# Patient Record
Sex: Male | Born: 1990 | Race: White | Hispanic: No | Marital: Single | Smoking: Current every day smoker
Health system: Southern US, Community
[De-identification: ages and names within clinical notes are randomized; demographics above are authoritative.]

## PROBLEM LIST (undated history)

## (undated) DIAGNOSIS — J45909 Unspecified asthma, uncomplicated: Secondary | ICD-10-CM

## (undated) DIAGNOSIS — F32A Depression, unspecified: Secondary | ICD-10-CM

## (undated) DIAGNOSIS — F1510 Other stimulant abuse, uncomplicated: Secondary | ICD-10-CM

## (undated) DIAGNOSIS — F419 Anxiety disorder, unspecified: Secondary | ICD-10-CM

## (undated) DIAGNOSIS — F319 Bipolar disorder, unspecified: Secondary | ICD-10-CM

## (undated) DIAGNOSIS — F4310 Post-traumatic stress disorder, unspecified: Secondary | ICD-10-CM

## (undated) DIAGNOSIS — F329 Major depressive disorder, single episode, unspecified: Secondary | ICD-10-CM

## (undated) HISTORY — DX: Post-traumatic stress disorder, unspecified: F43.10

## (undated) HISTORY — DX: Anxiety disorder, unspecified: F41.9

## (undated) HISTORY — DX: Major depressive disorder, single episode, unspecified: F32.9

## (undated) HISTORY — DX: Depression, unspecified: F32.A

---
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
## 2011-01-08 ENCOUNTER — Emergency Department: Payer: Self-pay | Admitting: Emergency Medicine

## 2011-01-18 ENCOUNTER — Emergency Department: Payer: Self-pay | Admitting: Unknown Physician Specialty

## 2011-11-07 ENCOUNTER — Emergency Department: Payer: Self-pay | Admitting: Emergency Medicine

## 2011-12-01 ENCOUNTER — Emergency Department: Payer: Self-pay | Admitting: *Deleted

## 2011-12-23 ENCOUNTER — Emergency Department: Payer: Self-pay | Admitting: Emergency Medicine

## 2012-02-05 ENCOUNTER — Emergency Department: Payer: Self-pay | Admitting: Emergency Medicine

## 2012-03-01 ENCOUNTER — Emergency Department: Payer: Self-pay | Admitting: Emergency Medicine

## 2012-03-01 LAB — CBC
HCT: 47.6 % (ref 40.0–52.0)
HGB: 16 g/dL (ref 13.0–18.0)
MCV: 87 fL (ref 80–100)
RBC: 5.46 10*6/uL (ref 4.40–5.90)
RDW: 13.6 % (ref 11.5–14.5)
WBC: 11.4 10*3/uL — ABNORMAL HIGH (ref 3.8–10.6)

## 2012-03-01 LAB — COMPREHENSIVE METABOLIC PANEL
BUN: 6 mg/dL — ABNORMAL LOW (ref 7–18)
Calcium, Total: 9.1 mg/dL (ref 8.5–10.1)
Chloride: 106 mmol/L (ref 98–107)
Co2: 28 mmol/L (ref 21–32)
EGFR (African American): >60
EGFR (Non-African Amer.): >60
Osmolality: 280 (ref 275–301)
Potassium: 3.3 mmol/L — ABNORMAL LOW (ref 3.5–5.1)
SGOT(AST): 32 U/L (ref 15–37)
SGPT (ALT): 27 U/L (ref 12–78)
Total Protein: 8.4 g/dL — ABNORMAL HIGH (ref 6.4–8.2)

## 2012-03-26 ENCOUNTER — Emergency Department: Payer: Self-pay | Admitting: Emergency Medicine

## 2012-03-29 ENCOUNTER — Emergency Department: Payer: Self-pay | Admitting: Emergency Medicine

## 2012-04-03 ENCOUNTER — Emergency Department: Payer: Self-pay | Admitting: Emergency Medicine

## 2012-04-08 ENCOUNTER — Emergency Department: Payer: Self-pay | Admitting: Emergency Medicine

## 2012-04-23 ENCOUNTER — Emergency Department: Payer: Self-pay | Admitting: Emergency Medicine

## 2012-04-28 ENCOUNTER — Emergency Department: Payer: Self-pay | Admitting: Emergency Medicine

## 2012-04-28 LAB — COMPREHENSIVE METABOLIC PANEL
Alkaline Phosphatase: 75 U/L (ref 50–136)
Anion Gap: 6 — ABNORMAL LOW (ref 7–16)
BUN: 7 mg/dL (ref 7–18)
Bilirubin,Total: 1.1 mg/dL — ABNORMAL HIGH (ref 0.2–1.0)
Calcium, Total: 9.1 mg/dL (ref 8.5–10.1)
Creatinine: 1 mg/dL (ref 0.60–1.30)
EGFR (African American): >60
EGFR (Non-African Amer.): >60
SGOT(AST): 13 U/L — ABNORMAL LOW (ref 15–37)
Sodium: 137 mmol/L (ref 136–145)
Total Protein: 8.3 g/dL — ABNORMAL HIGH (ref 6.4–8.2)

## 2012-04-28 LAB — DRUG SCREEN, URINE
Barbiturates, Ur Screen: NEGATIVE (ref ?–200)
Cannabinoid 50 Ng, Ur ~~LOC~~: NEGATIVE (ref ?–50)
Cocaine Metabolite,Ur ~~LOC~~: NEGATIVE (ref ?–300)
MDMA (Ecstasy)Ur Screen: NEGATIVE (ref ?–500)
Opiate, Ur Screen: NEGATIVE (ref ?–300)
Phencyclidine (PCP) Ur S: NEGATIVE (ref ?–25)
Tricyclic, Ur Screen: NEGATIVE (ref ?–1000)

## 2012-04-28 LAB — CBC
HCT: 45.3 % (ref 40.0–52.0)
MCHC: 33.4 g/dL (ref 32.0–36.0)
MCV: 86 fL (ref 80–100)
RDW: 13.5 % (ref 11.5–14.5)

## 2012-04-29 ENCOUNTER — Inpatient Hospital Stay: Payer: Self-pay | Admitting: Student

## 2012-04-29 LAB — CBC WITH DIFFERENTIAL/PLATELET
Basophil #: 0 10*3/uL (ref 0.0–0.1)
Basophil %: 0.2 %
Eosinophil #: 0 10*3/uL (ref 0.0–0.7)
HCT: 43.5 % (ref 40.0–52.0)
HGB: 15.2 g/dL (ref 13.0–18.0)
Lymphocyte #: 0.7 10*3/uL — ABNORMAL LOW (ref 1.0–3.6)
MCHC: 34.9 g/dL (ref 32.0–36.0)
MCV: 87 fL (ref 80–100)
Monocyte #: 0.7 x10 3/mm (ref 0.2–1.0)
Monocyte %: 8.2 %
Neutrophil %: 83.9 %
Platelet: 197 10*3/uL (ref 150–440)

## 2012-04-29 LAB — BASIC METABOLIC PANEL
BUN: 9 mg/dL (ref 7–18)
Co2: 21 mmol/L (ref 21–32)
Creatinine: 1.09 mg/dL (ref 0.60–1.30)
Glucose: 95 mg/dL (ref 65–99)
Osmolality: 280 (ref 275–301)

## 2012-04-29 LAB — RAPID INFLUENZA A&B ANTIGENS

## 2012-04-30 LAB — CBC WITH DIFFERENTIAL/PLATELET
Eosinophil %: 0 %
Lymphocyte %: 7.1 %
MCH: 29.3 pg (ref 26.0–34.0)
MCHC: 33.7 g/dL (ref 32.0–36.0)
MCV: 87 fL (ref 80–100)
Monocyte #: 0.5 x10 3/mm (ref 0.2–1.0)
Neutrophil #: 11.1 10*3/uL — ABNORMAL HIGH (ref 1.4–6.5)
Neutrophil %: 89.1 %
RDW: 14 % (ref 11.5–14.5)

## 2012-04-30 LAB — BASIC METABOLIC PANEL
BUN: 9 mg/dL (ref 7–18)
Calcium, Total: 8.3 mg/dL — ABNORMAL LOW (ref 8.5–10.1)
Co2: 23 mmol/L (ref 21–32)
Creatinine: 0.86 mg/dL (ref 0.60–1.30)
EGFR (Non-African Amer.): >60
Glucose: 144 mg/dL — ABNORMAL HIGH (ref 65–99)
Osmolality: 277 (ref 275–301)

## 2012-05-04 LAB — CULTURE, BLOOD (SINGLE)

## 2012-05-29 ENCOUNTER — Emergency Department: Payer: Self-pay | Admitting: Emergency Medicine

## 2012-06-08 ENCOUNTER — Emergency Department: Payer: Self-pay | Admitting: Internal Medicine

## 2012-06-09 ENCOUNTER — Emergency Department: Payer: Self-pay | Admitting: Emergency Medicine

## 2012-06-17 ENCOUNTER — Emergency Department: Payer: Self-pay | Admitting: Emergency Medicine

## 2012-06-17 LAB — DRUG SCREEN, URINE
Amphetamines, Ur Screen: NEGATIVE (ref ?–1000)
Barbiturates, Ur Screen: NEGATIVE (ref ?–200)
Cannabinoid 50 Ng, Ur ~~LOC~~: NEGATIVE (ref ?–50)
Methadone, Ur Screen: NEGATIVE (ref ?–300)
Phencyclidine (PCP) Ur S: NEGATIVE (ref ?–25)
Tricyclic, Ur Screen: NEGATIVE (ref ?–1000)

## 2012-06-17 LAB — COMPREHENSIVE METABOLIC PANEL
Alkaline Phosphatase: 69 U/L (ref 50–136)
BUN: 7 mg/dL (ref 7–18)
Calcium, Total: 8.6 mg/dL (ref 8.5–10.1)
Chloride: 109 mmol/L — ABNORMAL HIGH (ref 98–107)
Creatinine: 0.81 mg/dL (ref 0.60–1.30)
EGFR (Non-African Amer.): >60
Glucose: 82 mg/dL (ref 65–99)
Osmolality: 280 (ref 275–301)
Potassium: 3.3 mmol/L — ABNORMAL LOW (ref 3.5–5.1)
SGOT(AST): 18 U/L (ref 15–37)
Sodium: 142 mmol/L (ref 136–145)
Total Protein: 7.6 g/dL (ref 6.4–8.2)

## 2012-06-17 LAB — URINALYSIS, COMPLETE
Glucose,UR: NEGATIVE mg/dL (ref 0–75)
Leukocyte Esterase: NEGATIVE
Ph: 6 (ref 4.5–8.0)
Protein: NEGATIVE
Squamous Epithelial: <1

## 2012-06-17 LAB — CBC
HGB: 14.2 g/dL (ref 13.0–18.0)
MCH: 28.2 pg (ref 26.0–34.0)
RBC: 5.04 10*6/uL (ref 4.40–5.90)
RDW: 14 % (ref 11.5–14.5)
WBC: 8.7 10*3/uL (ref 3.8–10.6)

## 2012-06-17 LAB — ETHANOL
Ethanol %: <0.003 % (ref 0.000–0.080)
Ethanol: <3 mg/dL

## 2012-07-01 ENCOUNTER — Emergency Department: Payer: Self-pay | Admitting: Emergency Medicine

## 2012-07-02 ENCOUNTER — Emergency Department: Payer: Self-pay | Admitting: Emergency Medicine

## 2012-09-06 ENCOUNTER — Emergency Department: Payer: Self-pay | Admitting: Emergency Medicine

## 2012-09-29 ENCOUNTER — Emergency Department: Payer: Self-pay | Admitting: Emergency Medicine

## 2012-12-31 ENCOUNTER — Emergency Department: Payer: Self-pay | Admitting: Emergency Medicine

## 2013-04-01 ENCOUNTER — Emergency Department: Payer: Self-pay | Admitting: Emergency Medicine

## 2013-04-01 LAB — RAPID INFLUENZA A&B ANTIGENS

## 2013-06-27 ENCOUNTER — Emergency Department: Payer: Self-pay | Admitting: Emergency Medicine

## 2013-10-02 ENCOUNTER — Emergency Department: Payer: Self-pay | Admitting: Emergency Medicine

## 2013-10-30 ENCOUNTER — Emergency Department: Payer: Self-pay | Admitting: Emergency Medicine

## 2014-03-29 ENCOUNTER — Emergency Department: Payer: Self-pay | Admitting: Emergency Medicine

## 2014-05-27 ENCOUNTER — Emergency Department: Payer: Self-pay | Admitting: Physician Assistant

## 2014-06-26 DIAGNOSIS — J45909 Unspecified asthma, uncomplicated: Secondary | ICD-10-CM | POA: Insufficient documentation

## 2014-07-08 ENCOUNTER — Emergency Department: Payer: Self-pay | Admitting: Student

## 2014-07-11 LAB — LIPID PANEL
CHOLESTEROL: 156 mg/dL (ref 0–200)
HDL: 46 mg/dL (ref 35–70)
LDL Cholesterol: 74 mg/dL
TRIGLYCERIDES: 178 mg/dL — AB (ref 40–160)

## 2014-07-11 LAB — HEPATIC FUNCTION PANEL
ALT: 23 U/L (ref 10–40)
AST: 18 U/L (ref 14–40)
Alkaline Phosphatase: 87 U/L (ref 25–125)
Bilirubin, Total: 0.8 mg/dL

## 2014-07-11 LAB — BASIC METABOLIC PANEL
BUN: 10 mg/dL (ref 4–21)
CREATININE: 0.9 mg/dL (ref 0.6–1.3)
Glucose: 95 mg/dL
POTASSIUM: 4.5 mmol/L (ref 3.4–5.3)
Sodium: 140 mmol/L (ref 137–147)

## 2014-07-11 LAB — CBC AND DIFFERENTIAL
HCT: 45 % (ref 41–53)
HEMOGLOBIN: 15.7 g/dL (ref 13.5–17.5)
Neutrophils Absolute: 5 /uL
Platelets: 284 10*3/uL (ref 150–399)
WBC: 7.8 10^3/mL

## 2014-07-11 LAB — TSH: TSH: 7.91 u[IU]/mL — AB (ref 0.41–5.90)

## 2014-08-17 NOTE — H&P (Signed)
PATIENT NAME:  Nathan Koch, Dunbar L MR#:  [id number] DATE OF BIRTH:  [birth year]  DATE OF ADMISSION:  04/29/2012  CODE STATUS: FULL CODE.  Surrogate decision maker is his mother, phone number [phone].   CHIEF COMPLAINT: Shortness of breath and wheezing.   HISTORY OF PRESENT ILLNESS: This is a 24 year old male with history of asthma, presents with shortness of breath and wheezing with acute worsening 1 hour prior to presentation. The patient was evaluated earlier in the ED and treated for asthma exacerbation and discharged. He notes that just all day he had not been feeling well, characterized by some numbness in his legs, some intermittent chest pains located over the right side of his chest, as well as some low back pain of 1 day's duration. He initially presented to the Emergency Department and was treated for asthma exacerbation and discharged home; however, when he got home he continued to feel short of breath and returned to the Emergency Department via EMS and was found to be hypoxic with O21 sat 88% on room air. There he was found on exam tachycardic with heart rate 150s. He was febrile as well, 103.7, and noted to be influenza A positive in the Emergency Department. His chest x-ray looks like it might be blooming a right lower lobe infiltrate. Due to these findings, we have been called to admit the patient. He has received Solu-Medrol 125 mg IV, DuoNebs x 2, Tamiflu, Rocephin and azithromycin in the Emergency Department. The patient notes that he has had some mild productive cough over the preceding day or two characterized with white sputum. He admits to some chills. He admits to palpitations. He notes that his shortness of breath is exacerbated by coughing and notes that it is of moderate severity. Review of records notes that the patient was evaluated in the Emergency Department for asthma December 28th as well as January 2nd.   PAST MEDICAL HISTORY:  1. Asthma.  2. Anxiety.  3. Chronic back  pain.  MEDICATIONS: 1. Albuterol inhaler.  2. Ativan 0.5 mg 1 tablet 3 times a day as needed. Prescription was given for 3 days.   PAST SURGICAL HISTORY: Denies.   ALLERGIES: IBUPROFEN. WHICH CAUSES THROAT SWELLING AND FACIAL REDNESS:   FAMILY HISTORY: Dad deceased at age with myocardial infarction, also had hypertension.  Mother has hypertension and is currently hospitalized at [facility] for hip fracture. Brother had hypertension, deceased at age 23 a month ago with an asthma exacerbation.   SOCIAL HISTORY: He lives with his grandmother. He is not in school, not working at this point. He smokes 2 cigarettes a day for the last month. He notes that he started smoking again due to dealing with the death of his brother. Denies alcohol or illicit drug use.   REVIEW OF SYSTEMS: CONSTITUTIONAL: Admits to fever, chills. No fatigue.  EYES: No blurred vision, double vision, eye pain.  ENT: No tinnitus, ear pain, epistaxis.  RESPIRATORY: Admits to cough, wheezing. Denies hemoptysis, denies painful respirations.  CARDIOVASCULAR: Denies edema. Admits to some mild chest pain. Denies palpitations.  GASTROINTESTINAL: Denies nausea, vomiting, diarrhea, abdominal pain, melena, hematochezia.  GENITOURINARY: Denies frequency, urgency or dysuria.  ENDOCRINE: Denies any night sweats.  INTEGUMENTARY: Denies any new skin rashes.  MUSCULOSKELETAL: Admits to some mild low back pains. No joint swelling. No joint aches.  NEUROLOGIC: Denies headaches, tremors, dysarthria, focal weakness.  PSYCH: Admits to anxiety and currently feels a little depressed.   PHYSICAL EXAMINATION: VITAL SIGNS: Temperature 103.7. Currently,  temperature is 101.3, heart rate is 148, respirations 37, blood pressure 132/53, now sating 92% on nasal cannula. He was 88% on room air.  GENERAL: Young male in mild respiratory distress.  PSYCHIATRIC: Awake, alert, oriented x 3. Judgment and insight appear intact.  SKIN: Warm and  dry. No rashes appreciated.  EYES: Anicteric. Normal sclerae. Pupils are equally round and reactive to light. Normal lids.  ENT: Normal external ears and nares. Posterior oropharynx is clear without erythema or exudate.   NECK: Supple. No thyromegaly. No lymphadenopathy.  CARDIOVASCULAR: S1, S2, tachycardic. No pretibial edema.  RESPIRATORY: Mildly labored respirations at this point. He is wheezing diffusely. There is some use of his accessory muscles of the neck. He is able to speak in full sentences.  ABDOMEN: Soft, nontender, and nondistended with no hepatomegaly.  MUSCULOSKELETAL: Full range of motion of all extremities. No clubbing or cyanosis appreciated.   LABORATORY, DIAGNOSTIC AND RADIOLOGICAL DATA:  White count is 8.7, hemoglobin 15.2, hematocrit 42.5, platelets 197, MCV 87.  Influenza A and B antigens is positive for influenza A. BMP shows glucose 95, BUN of 9, creatinine 1.09, sodium 141, potassium 3.5, chloride 109, bicarbonate 21, calcium 8.8, anion gap of 11, osmolality of 280. These are similar to labs obtained January 2nd. D-dimer obtained January 2nd was less than 0.22. UDS obtained January 2nd was negative.   Chest x-ray obtained January 2nd showed no acute cardiopulmonary disease.  Chest x-ray obtained January 3rd, prelim, concerning for right lower lobe infiltrate.  EKG shows sinus tachycardia at 154 beats per minute with rightward axis.   ASSESSMENT AND PLAN: A 24 year old male with history of asthma presenting with asthma exacerbation most likely as a result of influenza A, pneumonia with possible bacterial pneumonia, although I doubt this.   1. Acute asthma exacerbation: Continue steroids, Albuterol therapy.  2. Viral influenza: We will continue Tamiflu. The patient is empirically started on Rocephin and azithromycin. However, given the lack of a leukocytosis, I suspect this might all be viral in etiology, so we can narrow down antibiotics over the next few days.   3. Sepsis due to viral influenza as evidenced by tachypnea, tachycardia, fever: Send blood cultures; again, most likely due to influenza and pneumonia. Continue Tamiflu, continue empiric antibiotics.   4. Acute hypoxic respiratory failure due to asthma exacerbation as well as viral pneumonia: Continue supplemental oxygen, wean as tolerated. Continue Albuterol therapy.  5. Disposition: The patient is being admitted inpatient for evaluation and management of acute hypoxic respiratory failure due to asthma exacerbation and viral influenza. Anticipate he will require 2 hospital midnight stays for evaluation and management.      TIME SPENT COORDINATING ADMISSION: 50 minutes.  ____________________________ Aurther Loft, DO aeo:cb D: 04/29/2012 04:38:23 ET T: 04/29/2012 07:02:57 ET JOB#: [id number]  cc: Aurther Loft, DO, <Dictator> Jonalyn Sedlak E Colton Tassin DO ELECTRONICALLY SIGNED 06/21/2012 3:46

## 2014-08-17 NOTE — Discharge Summary (Signed)
PATIENT NAME:  Nathan Koch, Nathan Koch MR#:  [id number] DATE OF BIRTH:  25-Apr-1991  DATE OF ADMISSION:  04/29/2012 DATE OF DISCHARGE:  05/03/2012  CHIEF COMPLAINT:  Shortness of breath and wheezing.   DISCHARGE DIAGNOSES:   1.  Sepsis from influenza pneumonia.  2.  Tachycardia.  3.  Asthma exacerbation.   DISCHARGE MEDICATIONS:  Tamiflu 1 tab every 12 hours for 3 more doses, prednisone 40 mg for 1 day, then 30 mg daily for 1 day, then 20 mg for 1 day, then 10 mg for 1 day, then stop, Ativan 0.5 mg 1 tab 3 times a day x 3 as needed, albuterol CFC free 90 mcg inhaled 2 puffs 2 times a day.   DISPOSITION:  Home.   DIET:  Low sodium.   ACTIVITY:  As tolerated.   FOLLOWUP:  Please follow with your primary care physician within 1 to 2 weeks.   HISTORY OF PRESENT ILLNESS AND HOSPITAL COURSE:  For full details of the H and P, please see the dictation of the H and P on April 29, 2012, by Dr. Marc Morganskafor, but briefly, this is a pleasant 24 year old male who came in with wheezing and shortness of breath. The patient had been evaluated in the ED the same day for asthma exacerbation and discharged, but the patient presented back. He was found to be hypoxic with O2 sats of 88% on room air. Furthermore, he had a fever, had a positive influenza and therefore was admitted to the hospitalist service as he was short of breath. His WBC on January 3rd was 8.7. His blood cultures from that day have been negative, but the patient did have a test positive for influenza A. He was started on Tamiflu. Furthermore, ABGs showed pO2 of 50 and pH was 7.41. He was also started on antibiotics to cover superimposed bacterial pneumonia. His initial chest x-ray however did not have acute cardiopulmonary disease. The patient was also tachycardic on arrival. This was likely in the setting of his lung issues. He was started on steroids as well as nebs and antibiotics. He did improve gradually and on the day of discharge is afebrile, although he  does have leukocytosis that is likely in the setting of steroids. He is ambulating without significant dyspnea. He has had no further fevers and he feels overall well. He will be discharged with 3 doses of Tamiflu still. His tachycardia has resolved.   DISPOSITION:  Home.   CODE STATUS:  Full code.   ____________________________ Krystal EatonShayiq Anothy Bufano, MD sa:si D: 05/03/2012 18:07:54 ET T: 05/03/2012 23:03:37 ET JOB#: [id number]  cc: Krystal EatonShayiq Keesha Pellum, MD, <Dictator> Krystal EatonSHAYIQ Aron Inge MD ELECTRONICALLY SIGNED 05/14/2012 17:12

## 2015-01-28 ENCOUNTER — Encounter: Payer: Self-pay | Admitting: Emergency Medicine

## 2015-01-28 ENCOUNTER — Emergency Department
Admission: EM | Admit: 2015-01-28 | Discharge: 2015-01-28 | Disposition: A | Discharge status: Home / Self Care | Payer: Self-pay | Attending: Emergency Medicine | Admitting: Emergency Medicine

## 2015-01-28 DIAGNOSIS — K2970 Gastritis, unspecified, without bleeding: Secondary | ICD-10-CM | POA: Insufficient documentation

## 2015-01-28 DIAGNOSIS — Z720 Tobacco use: Secondary | ICD-10-CM | POA: Insufficient documentation

## 2015-01-28 HISTORY — DX: Unspecified asthma, uncomplicated: J45.909

## 2015-01-28 MED ORDER — ONDANSETRON 8 MG PO TBDP
8.0000 mg | ORAL_TABLET | Freq: Once | ORAL | Status: AC
  Administered 2015-01-28: 8 mg via ORAL
  Filled 2015-01-28: qty 1

## 2015-01-28 MED ORDER — ONDANSETRON HCL 4 MG PO TABS: 4.0000 mg | ORAL_TABLET | Freq: Every day | ORAL | Status: DC | PRN

## 2015-01-28 NOTE — ED Provider Notes (Signed)
[facility] Emergency Department Provider Note  ____________________________________________  Time seen: On arrival  I have reviewed the triage vital signs and the nursing notes.   HISTORY  Chief Complaint Abdominal Pain    HPI Nathan Koch is a 24 y.o. male who complains of crampy abdominal pain which is mild and nausea and vomiting. He is felt this way for 2-3 days. Triage note mentions 2 weeks but he explicitly states 2-3 days. He denies fevers chills. He has not had diarrhea. He reports he only has abdominal cramping when he gets nauseous. He is tolerating liquids to a degree. He has no sick contacts. No history of abdominal surgery     Past Medical History  Diagnosis Date  . Asthma     There are no active problems to display for this patient.   History reviewed. No pertinent past surgical history.  No current outpatient prescriptions on file.  Allergies Review of patient's allergies indicates no known allergies.  History reviewed. No pertinent family history.  Social History Social History  Substance Use Topics  . Smoking status: Current Every Day Smoker  . Smokeless tobacco: None  . Alcohol Use: Yes    Review of Systems  Constitutional: Negative for fever. Eyes: Negative for visual changes. ENT: Negative for sore throat Cardiovascular: Negative for chest pain. Respiratory: Negative for shortness of breath. Gastrointestinal positive for nausea, negative for diarrhea Genitourinary: Negative for dysuria. Musculoskeletal: Negative for back pain. Skin: Negative for rash. Neurological: Negative for headaches Psychiatric: No anxiety    ____________________________________________   PHYSICAL EXAM:  VITAL SIGNS: ED Triage Vitals  Enc Vitals Group     BP 01/28/15 0827 146/63 mmHg     Pulse Rate 01/28/15 0827 80     Resp 01/28/15 0827 20     Temp 01/28/15 0827 98.2 F (36.8 C)     Temp Source 01/28/15 0827 Oral     SpO2  01/28/15 0827 96 %     Weight 01/28/15 0827 200 lb (90.719 kg)     Height 01/28/15 0827  (1.702 m)     Head Cir --      Peak Flow --      Pain Score 01/28/15 0828 6     Pain Loc --      Pain Edu? --      Excl. in GC? --      Constitutional: Alert and oriented. Well appearing and in no distress. Eyes: Conjunctivae are normal.  ENT   Head: Normocephalic and atraumatic.   Mouth/Throat: Mucous membranes are moist. Cardiovascular: Normal rate, regular rhythm. Normal and symmetric distal pulses are present in all extremities.  Respiratory: Normal respiratory effort without tachypnea nor retractions. Gastrointestinal: Soft and non-tender in all quadrants. No distention. There is no CVA tenderness. Genitourinary: deferred Musculoskeletal: Nontender with normal range of motion in all extremities. No lower extremity tenderness nor edema. Neurologic:  Normal speech and language. No gross focal neurologic deficits are appreciated. Skin:  Skin is warm, dry and intact. No rash noted. Psychiatric: Mood and affect are normal. Patient exhibits appropriate insight and judgment.  ____________________________________________    LABS (pertinent positives/negatives)  Labs Reviewed - No data to display  ____________________________________________   EKG  None  ____________________________________________    RADIOLOGY I have personally reviewed any xrays that were ordered on this patient:   ____________________________________________   PROCEDURES  Procedure(s) performed: none  Critical Care performed: none  ____________________________________________   INITIAL IMPRESSION / ASSESSMENT AND PLAN /  ED COURSE  Pertinent labs & imaging results that were available during my care of the patient were reviewed by me and considered in my medical decision making (see chart for details).  Patient well-appearing and in no distress. His vital signs are unremarkable aside from  mildly elevated high blood pressure for which he has agreed to follow-up with his PCP. His abdominal exam is benign. His symptoms are consistent with a mild gastritis. Given that he is well-appearing with a benign exam, young and has no significant medical history do not feel labs will be useful at this time.  We will treat the patient with nausea medication and have him follow-up with PCP prn  ____________________________________________   FINAL CLINICAL IMPRESSION(S) / ED DIAGNOSES  Final diagnoses:  Gastritis     Jene Every, MD 01/28/15 [id number]

## 2015-01-28 NOTE — ED Notes (Signed)
Pt to ed with c/o abd pain and vomiting x 2 weeks.  Pt states he has had 4 episodes of vomiting in the last 24 hours.  Pt denies diarrhea.

## 2015-01-28 NOTE — Discharge Instructions (Signed)
Gastritis, Adult °Gastritis is soreness and puffiness (inflammation) of the lining of the stomach. If you do not get help, gastritis can cause bleeding and sores (ulcers) in the stomach. °HOME CARE  °· Only take medicine as told by your doctor. °· If you were given antibiotic medicines, take them as told. Finish the medicines even if you start to feel better. °· Drink enough fluids to keep your pee (urine) clear or pale yellow. °· Avoid foods and drinks that make your problems worse. Foods you may want to avoid include: °¨ Caffeine or alcohol. °¨ Chocolate. °¨ Mint. °¨ Garlic and onions. °¨ Spicy foods. °¨ Citrus fruits, including oranges, lemons, or limes. °¨ Food containing tomatoes, including sauce, chili, salsa, and pizza. °¨ Fried and fatty foods. °· Eat small meals throughout the day instead of large meals. °GET HELP RIGHT AWAY IF:  °· You have black or dark red poop (stools). °· You throw up (vomit) blood. It may look like coffee grounds. °· You cannot keep fluids down. °· Your belly (abdominal) pain gets worse. °· You have a fever. °· You do not feel better after 1 week. °· You have any other questions or concerns. °MAKE SURE YOU:  °· Understand these instructions. °· Will watch your condition. °· Will get help right away if you are not doing well or get worse. °Document Released: 09/30/2007 Document Revised: 07/06/2011 Document Reviewed: 05/27/2011 °[employer]® Patient Information ©2015 [employer]. This information is not intended to replace advice given to you by your health care provider. Make sure you discuss any questions you have with your health care provider. ° °

## 2015-08-16 DIAGNOSIS — J45909 Unspecified asthma, uncomplicated: Secondary | ICD-10-CM

## 2016-05-04 ENCOUNTER — Emergency Department: Payer: Self-pay

## 2016-05-04 ENCOUNTER — Emergency Department
Admission: EM | Admit: 2016-05-04 | Discharge: 2016-05-04 | Disposition: A | Discharge status: Home / Self Care | Payer: Self-pay | Attending: Emergency Medicine | Admitting: Emergency Medicine

## 2016-05-04 ENCOUNTER — Encounter: Payer: Self-pay | Admitting: Emergency Medicine

## 2016-05-04 DIAGNOSIS — Z87891 Personal history of nicotine dependence: Secondary | ICD-10-CM | POA: Insufficient documentation

## 2016-05-04 DIAGNOSIS — J181 Lobar pneumonia, unspecified organism: Secondary | ICD-10-CM | POA: Insufficient documentation

## 2016-05-04 DIAGNOSIS — J189 Pneumonia, unspecified organism: Secondary | ICD-10-CM

## 2016-05-04 DIAGNOSIS — J45909 Unspecified asthma, uncomplicated: Secondary | ICD-10-CM | POA: Insufficient documentation

## 2016-05-04 LAB — CBC
HCT: 48.2 % (ref 40.0–52.0)
HEMOGLOBIN: 16.4 g/dL (ref 13.0–18.0)
MCH: 29.5 pg (ref 26.0–34.0)
MCHC: 34 g/dL (ref 32.0–36.0)
MCV: 86.7 fL (ref 80.0–100.0)
PLATELETS: 296 10*3/uL (ref 150–440)
RBC: 5.56 MIL/uL (ref 4.40–5.90)
RDW: 14.1 % (ref 11.5–14.5)
WBC: 10.5 10*3/uL (ref 3.8–10.6)

## 2016-05-04 LAB — BASIC METABOLIC PANEL
ANION GAP: 7 (ref 5–15)
BUN: 10 mg/dL (ref 6–20)
CALCIUM: 9.2 mg/dL (ref 8.9–10.3)
CO2: 25 mmol/L (ref 22–32)
CREATININE: 0.87 mg/dL (ref 0.61–1.24)
Chloride: 105 mmol/L (ref 101–111)
GFR calc Af Amer: >60 mL/min (ref >60)
GLUCOSE: 106 mg/dL — AB (ref 65–99)
Potassium: 4 mmol/L (ref 3.5–5.1)
Sodium: 137 mmol/L (ref 135–145)

## 2016-05-04 LAB — TROPONIN I

## 2016-05-04 MED ORDER — AZITHROMYCIN 250 MG PO TABS: ORAL_TABLET | ORAL | 0 refills | Status: AC

## 2016-05-04 MED ORDER — ALBUTEROL SULFATE HFA 108 (90 BASE) MCG/ACT IN AERS: 2.0000 | INHALATION_SPRAY | Freq: Four times a day (QID) | RESPIRATORY_TRACT | 2 refills | Status: DC | PRN

## 2016-05-04 NOTE — ED Triage Notes (Signed)
Pt presents with chest pain/tightness, cough, back pain x 1 week. Pt reports that he has not taken any otc meds for it, has no pcp. NAD noted.

## 2016-05-04 NOTE — ED Provider Notes (Signed)
[facility] Emergency Department Provider Note   ____________________________________________    I have reviewed the triage vital signs and the nursing notes.   HISTORY  Chief Complaint Chest Pain and Fatigue     HPI Nathan Koch is a 26 y.o. male who presents with complaints of chest discomfort, fatigue and cough. Patient reports this is been going on for 1 week. He suspects he may have had a fever but denies chills. No recent travel. No calf pain or swelling. No history of heart disease. He does not smoke.   Past Medical History:  Diagnosis Date  . Anxiety   . Asthma   . Depression     Patient Active Problem List   Diagnosis Date Noted  . Asthma 06/26/2014    History reviewed. No pertinent surgical history.  Prior to Admission medications   Medication Sig Start Date End Date Taking? Authorizing Provider  albuterol (PROVENTIL HFA;VENTOLIN HFA) 108 (90 Base) MCG/ACT inhaler Inhale 2 puffs into the lungs every 6 (six) hours as needed for wheezing or shortness of breath. 05/04/16   Jene Everyobert Kealie Barrie, MD  azithromycin (ZITHROMAX Z-PAK) 250 MG tablet Take 2 tablets (500 mg) on  Day 1,  followed by 1 tablet (250 mg) once daily on Days 2 through 5. 05/04/16 05/09/16  Jene Everyobert Mathilda Maguire, MD  Fluticasone-Salmeterol (ADVAIR DISKUS) 250-50 MCG/DOSE AEPB Inhale 1 puff into the lungs 2 (two) times daily.    Historical Provider, MD  ondansetron (ZOFRAN) 4 MG tablet Take 1 tablet (4 mg total) by mouth daily as needed for nausea or vomiting. 01/28/15   Jene Everyobert Isayah Ignasiak, MD     Allergies Patient has no known allergies.  Family History  Problem Relation Age of Onset  . Asthma Mother   . Asthma Brother     Social History Social History  Substance Use Topics  . Smoking status: Former Smoker    Quit date: 03/05/2016  . Smokeless tobacco: Never Used  . Alcohol use 4.2 oz/week    7 Standard drinks or equivalent per week    Review of Systems  Constitutional: No  fever/chills  Cardiovascular: Chest aching on the left chest Respiratory: Denies shortness of breath. Gastrointestinal: No abdominal pain.  No nausea, no vomiting.    Musculoskeletal: Negative for back pain. Skin: Negative for rash. Neurological: Negative for headaches or weakness  10-point ROS otherwise negative.  ____________________________________________   PHYSICAL EXAM:  VITAL SIGNS: ED Triage Vitals  Enc Vitals Group     BP 05/04/16 1510 129/76     Pulse Rate 05/04/16 1510 64     Resp 05/04/16 1510 18     Temp 05/04/16 1510 97.7 F (36.5 C)     Temp Source 05/04/16 1510 Oral     SpO2 05/04/16 1510 97 %     Weight 05/04/16 1510 260 lb (117.9 kg)     Height 05/04/16 1510 5\' 7"  (1.702 m)     Head Circumference --      Peak Flow --      Pain Score 05/04/16 1514 7     Pain Loc --      Pain Edu? --      Excl. in GC? --     Constitutional: Alert and oriented. No acute distress. Pleasant and interactive Eyes: Conjunctivae are normal.   Nose: No congestion/rhinnorhea. Mouth/Throat: Mucous membranes are moist.    Cardiovascular: Normal rate, regular rhythm. Grossly normal heart sounds.  Good peripheral circulation. Respiratory: Normal respiratory effort.  No  retractions. Lungs CTAB. Gastrointestinal: Soft and nontender. No distention.  No CVA tenderness. Genitourinary: deferred Musculoskeletal: No lower extremity tenderness nor edema.  Warm and well perfused Neurologic:  Normal speech and language. No gross focal neurologic deficits are appreciated.  Skin:  Skin is warm, dry and intact. No rash noted. Psychiatric: Mood and affect are normal. Speech and behavior are normal.  ____________________________________________   LABS (all labs ordered are listed, but only abnormal results are displayed)  Labs Reviewed  BASIC METABOLIC PANEL - Abnormal; Notable for the following:       Result Value   Glucose, Bld 106 (*)    All other components within normal limits    CBC  TROPONIN I   ____________________________________________  EKG  ED ECG REPORT I, Jene Every, the attending physician, personally viewed and interpreted this ECG.  Date: 05/04/2016   Rhythm: normal sinus rhythm QRS Axis: normal Intervals: normal ST/T Wave abnormalities: normal Conduction Disturbances: none Narrative Interpretation: unremarkable  ____________________________________________  RADIOLOGY  Chest x-ray demonstrates lingular/left upper lobe pneumonia ____________________________________________   PROCEDURES  Procedure(s) performed: No    Critical Care performed: No ____________________________________________   INITIAL IMPRESSION / ASSESSMENT AND PLAN / ED COURSE  Pertinent labs & imaging results that were available during my care of the patient were reviewed by me and considered in my medical decision making (see chart for details).  Patient's chest discomfort is likely caused by pneumonia. He is well-appearing and in no distress. His vital signs are unremarkable. His lab work is reassuring. We will treat with by mouth antibiotics. Strict return precautions discussed at length.  Clinical Course    ____________________________________________   FINAL CLINICAL IMPRESSION(S) / ED DIAGNOSES  Final diagnoses:  Community acquired pneumonia of left lower lobe of lung ([name])      NEW MEDICATIONS STARTED DURING THIS VISIT:  Discharge Medication List as of 05/04/2016  5:15 PM    START taking these medications   Details  azithromycin (ZITHROMAX Z-PAK) 250 MG tablet Take 2 tablets (500 mg) on  Day 1,  followed by 1 tablet (250 mg) once daily on Days 2 through 5., Print         Note:  This document was prepared using Dragon voice recognition software and may include unintentional dictation errors.    Jene Every, MD 05/04/16 [id number]

## 2017-05-31 ENCOUNTER — Encounter: Payer: Self-pay | Admitting: Emergency Medicine

## 2017-05-31 ENCOUNTER — Emergency Department
Admission: EM | Admit: 2017-05-31 | Discharge: 2017-05-31 | Disposition: A | Discharge status: Home / Self Care | Payer: Self-pay | Attending: Emergency Medicine | Admitting: Emergency Medicine

## 2017-05-31 ENCOUNTER — Other Ambulatory Visit: Payer: Self-pay

## 2017-05-31 DIAGNOSIS — Z87891 Personal history of nicotine dependence: Secondary | ICD-10-CM | POA: Insufficient documentation

## 2017-05-31 DIAGNOSIS — J4531 Mild persistent asthma with (acute) exacerbation: Secondary | ICD-10-CM | POA: Insufficient documentation

## 2017-05-31 DIAGNOSIS — J069 Acute upper respiratory infection, unspecified: Secondary | ICD-10-CM

## 2017-05-31 MED ORDER — ALBUTEROL SULFATE HFA 108 (90 BASE) MCG/ACT IN AERS: 2.0000 | INHALATION_SPRAY | RESPIRATORY_TRACT | 0 refills | Status: DC | PRN

## 2017-05-31 MED ORDER — METHYLPREDNISOLONE SODIUM SUCC 125 MG IJ SOLR
125.0000 mg | Freq: Once | INTRAMUSCULAR | Status: AC
  Administered 2017-05-31: 125 mg via INTRAMUSCULAR
  Filled 2017-05-31: qty 2

## 2017-05-31 MED ORDER — IPRATROPIUM-ALBUTEROL 0.5-2.5 (3) MG/3ML IN SOLN
3.0000 mL | Freq: Once | RESPIRATORY_TRACT | Status: AC
  Administered 2017-05-31: 3 mL via RESPIRATORY_TRACT
  Filled 2017-05-31: qty 3

## 2017-05-31 MED ORDER — PREDNISONE 50 MG PO TABS: 50.0000 mg | ORAL_TABLET | Freq: Every day | ORAL | 0 refills | Status: DC

## 2017-05-31 MED ORDER — FLUTICASONE-SALMETEROL 250-50 MCG/DOSE IN AEPB: 1.0000 | INHALATION_SPRAY | Freq: Two times a day (BID) | RESPIRATORY_TRACT | 2 refills | Status: DC

## 2017-05-31 NOTE — ED Provider Notes (Signed)
[facility] Emergency Department Provider Note  ____________________________________________  Time seen: Approximately 6:13 PM  I have reviewed the triage vital signs and the nursing notes.   HISTORY  Chief Complaint Asthma    HPI Nathan Koch is a 27 y.o. male who presents the emergency department complaining of asthma exacerbation.  Patient reports that he has had some minor cold-like symptoms over the past week.  This seemed to improve however he ran out of his asthma medication and developed shortness of breath and wheezing.  Patient states that he still has some mild nasal congestion but otherwise his complaints are all feeling short of breath and wheezing.  Patient reports that prior to running out of his medication they were doing a good job controlling symptoms.  Patient denies any headache, visual changes, neck pain, chest pain, abdominal pain, nausea vomiting, diarrhea or constipation.  he takes Advair and albuterol for his asthma  Past Medical History:  Diagnosis Date  . Anxiety   . Asthma   . Depression     Patient Active Problem List   Diagnosis Date Noted  . Asthma 06/26/2014    History reviewed. No pertinent surgical history.  Prior to Admission medications   Medication Sig Start Date End Date Taking? Authorizing Provider  albuterol (PROVENTIL HFA;VENTOLIN HFA) 108 (90 Base) MCG/ACT inhaler Inhale 2 puffs into the lungs every 4 (four) hours as needed for wheezing or shortness of breath. 05/31/17   Cuthriell, Delorise Royals, PA-C  Fluticasone-Salmeterol (ADVAIR DISKUS) 250-50 MCG/DOSE AEPB Inhale 1 puff into the lungs 2 (two) times daily. 05/31/17   Cuthriell, Delorise Royals, PA-C  ondansetron (ZOFRAN) 4 MG tablet Take 1 tablet (4 mg total) by mouth daily as needed for nausea or vomiting. 01/28/15   Jene Every, MD  predniSONE (DELTASONE) 50 MG tablet Take 1 tablet (50 mg total) by mouth daily with breakfast. 05/31/17   Cuthriell, Delorise Royals, PA-C     Allergies Patient has no known allergies.  Family History  Problem Relation Age of Onset  . Asthma Mother   . Asthma Brother     Social History Social History   Tobacco Use  . Smoking status: Former Smoker    Last attempt to quit: 03/05/2016    Years since quitting: 1.2  . Smokeless tobacco: Never Used  Substance Use Topics  . Alcohol use: Yes    Alcohol/week: 4.2 oz    Types: 7 Standard drinks or equivalent per week  . Drug use: No     Review of Systems  Constitutional: No fever/chills Eyes: No visual changes. No discharge ENT: No upper respiratory complaints. Cardiovascular: no chest pain. Respiratory: no cough.  Positive for mild shortness of breath and wheezing. Gastrointestinal: No abdominal pain.  No nausea, no vomiting.  No diarrhea.  No constipation. Musculoskeletal: Negative for musculoskeletal pain. Skin: Negative for rash, abrasions, lacerations, ecchymosis. Neurological: Negative for headaches, focal weakness or numbness. 10-point ROS otherwise negative.  ____________________________________________   PHYSICAL EXAM:  VITAL SIGNS: ED Triage Vitals  Enc Vitals Group     BP 05/31/17 1716 (!) 144/82     Pulse Rate 05/31/17 1716 77     Resp 05/31/17 1716 (!) 28     Temp 05/31/17 1716 97.9 F (36.6 C)     Temp Source 05/31/17 1716 Oral     SpO2 05/31/17 1716 97 %     Weight 05/31/17 1717 265 lb (120.2 kg)     Height 05/31/17 1717 5\' 8"  (1.727 m)  Head Circumference --      Peak Flow --      Pain Score 05/31/17 1723 7     Pain Loc --      Pain Edu? --      Excl. in GC? --      Constitutional: Alert and oriented. Well appearing and in no acute distress. Eyes: Conjunctivae are normal. PERRL. EOMI. Head: Atraumatic. ENT:      Ears: EACs and TMs unremarkable bilaterally.      Nose: Moderate clear congestion/rhinnorhea.      Mouth/Throat: Mucous membranes are moist.  Neck: No stridor.   Hematological/Lymphatic/Immunilogical: No cervical  lymphadenopathy. Cardiovascular: Normal rate, regular rhythm. Normal S1 and S2.  Good peripheral circulation. Respiratory: Normal respiratory effort without tachypnea or retractions. Lungs expiratory wheezing bilaterally.  No rales or rhonchi.  After second DuoNeb treatment, patient's symptoms and auscultation improved significantly.Peri [place]. Good air entry to the bases with no decreased or absent breath sounds. Musculoskeletal: Full range of motion to all extremities. No gross deformities appreciated. Neurologic:  Normal speech and language. No gross focal neurologic deficits are appreciated.  Skin:  Skin is warm, dry and intact. No rash noted. Psychiatric: Mood and affect are normal. Speech and behavior are normal. Patient exhibits appropriate insight and judgement.   ____________________________________________   LABS (all labs ordered are listed, but only abnormal results are displayed)  Labs Reviewed - No data to display ____________________________________________  EKG   ____________________________________________  RADIOLOGY   No results found.  ____________________________________________    PROCEDURES  Procedure(s) performed:    Procedures    Medications  ipratropium-albuterol (DUONEB) 0.5-2.5 (3) MG/3ML nebulizer solution 3 mL (3 mLs Nebulization Given 05/31/17 1726)  ipratropium-albuterol (DUONEB) 0.5-2.5 (3) MG/3ML nebulizer solution 3 mL (3 mLs Nebulization Given 05/31/17 1851)  methylPREDNISolone sodium succinate (SOLU-MEDROL) 125 mg/2 mL injection 125 mg (125 mg Intramuscular Given 05/31/17 1912)     ____________________________________________   INITIAL IMPRESSION / ASSESSMENT AND PLAN / ED COURSE  Pertinent labs & imaging results that were available during my care of the patient were reviewed by me and considered in my medical decision making (see chart for details).  Review of the [place] CSRS was performed in accordance of the [facility] prior to dispensing any  controlled drugs.     Patient's diagnosis is consistent with resolving URI and asthma exacerbation department with asthma complaints after running out of his 2 inhalers last night.  After 2 DuoNeb treatments and injection of Solu-Medrol, patient has good resolution of symptoms and lung sounds have improved.  No indication for further workup at this time.. Patient will be discharged home with prescriptions for Advair and albuterol as well as 5-day course of prednisone. Patient is to follow up with primary care as needed or otherwise directed. Patient is given ED precautions to return to the ED for any worsening or new symptoms.     ____________________________________________  FINAL CLINICAL IMPRESSION(S) / ED DIAGNOSES  Final diagnoses:  Mild persistent asthma with exacerbation  Viral URI      NEW MEDICATIONS STARTED DURING THIS VISIT:  ED Discharge Orders        Ordered    Fluticasone-Salmeterol (ADVAIR DISKUS) 250-50 MCG/DOSE AEPB  2 times daily     05/31/17 1937    albuterol (PROVENTIL HFA;VENTOLIN HFA) 108 (90 Base) MCG/ACT inhaler  Every 4 hours PRN     05/31/17 1937    predniSONE (DELTASONE) 50 MG tablet  Daily with breakfast     05/31/17 1941  This chart was dictated using voice recognition software/Dragon. Despite best efforts to proofread, errors can occur which can change the meaning. Any change was purely unintentional.    Racheal Patches, PA-C 05/31/17 1942    Sharyn Creamer, MD 06/01/17 [phone]

## 2017-05-31 NOTE — ED Triage Notes (Signed)
Asthma symptoms x 2 hours. States ran out of inhalers yesterday. Usually uses every day. Denies fevers. Speaking in full sentences.

## 2017-07-07 ENCOUNTER — Encounter: Payer: Self-pay | Admitting: Emergency Medicine

## 2017-07-07 ENCOUNTER — Emergency Department
Admission: EM | Admit: 2017-07-07 | Discharge: 2017-07-07 | Disposition: A | Discharge status: Home / Self Care | Payer: Self-pay | Attending: Student in an Organized Health Care Education/Training Program | Admitting: Student in an Organized Health Care Education/Training Program

## 2017-07-07 ENCOUNTER — Other Ambulatory Visit: Payer: Self-pay

## 2017-07-07 DIAGNOSIS — R112 Nausea with vomiting, unspecified: Secondary | ICD-10-CM | POA: Insufficient documentation

## 2017-07-07 DIAGNOSIS — Z79899 Other long term (current) drug therapy: Secondary | ICD-10-CM | POA: Insufficient documentation

## 2017-07-07 DIAGNOSIS — Z87891 Personal history of nicotine dependence: Secondary | ICD-10-CM | POA: Insufficient documentation

## 2017-07-07 DIAGNOSIS — J45909 Unspecified asthma, uncomplicated: Secondary | ICD-10-CM | POA: Insufficient documentation

## 2017-07-07 DIAGNOSIS — R1084 Generalized abdominal pain: Secondary | ICD-10-CM | POA: Insufficient documentation

## 2017-07-07 LAB — URINALYSIS, COMPLETE (UACMP) WITH MICROSCOPIC
Bilirubin Urine: NEGATIVE
GLUCOSE, UA: NEGATIVE mg/dL
HGB URINE DIPSTICK: NEGATIVE
Ketones, ur: NEGATIVE mg/dL
Leukocytes, UA: NEGATIVE
NITRITE: NEGATIVE
PH: 5 (ref 5.0–8.0)
Protein, ur: NEGATIVE mg/dL
Specific Gravity, Urine: 1.014 (ref 1.005–1.030)
Squamous Epithelial / LPF: NONE SEEN

## 2017-07-07 LAB — COMPREHENSIVE METABOLIC PANEL
ALK PHOS: 68 U/L (ref 38–126)
ALT: 28 U/L (ref 17–63)
ANION GAP: 7 (ref 5–15)
AST: 27 U/L (ref 15–41)
Albumin: 4 g/dL (ref 3.5–5.0)
BILIRUBIN TOTAL: 1 mg/dL (ref 0.3–1.2)
BUN: 9 mg/dL (ref 6–20)
CALCIUM: 9.2 mg/dL (ref 8.9–10.3)
CO2: 25 mmol/L (ref 22–32)
CREATININE: 0.75 mg/dL (ref 0.61–1.24)
Chloride: 107 mmol/L (ref 101–111)
GFR calc non Af Amer: >60 mL/min (ref >60)
Glucose, Bld: 107 mg/dL — ABNORMAL HIGH (ref 65–99)
Potassium: 3.8 mmol/L (ref 3.5–5.1)
Sodium: 139 mmol/L (ref 135–145)
TOTAL PROTEIN: 7.3 g/dL (ref 6.5–8.1)

## 2017-07-07 LAB — CBC
HCT: 45 % (ref 40.0–52.0)
HEMOGLOBIN: 15.4 g/dL (ref 13.0–18.0)
MCH: 29.6 pg (ref 26.0–34.0)
MCHC: 34.1 g/dL (ref 32.0–36.0)
MCV: 86.9 fL (ref 80.0–100.0)
PLATELETS: 279 10*3/uL (ref 150–440)
RBC: 5.19 MIL/uL (ref 4.40–5.90)
RDW: 14.1 % (ref 11.5–14.5)
WBC: 10.9 10*3/uL — ABNORMAL HIGH (ref 3.8–10.6)

## 2017-07-07 LAB — LIPASE, BLOOD: Lipase: 34 U/L (ref 11–51)

## 2017-07-07 MED ORDER — PROMETHAZINE HCL 12.5 MG PO TABS: 12.5000 mg | ORAL_TABLET | Freq: Four times a day (QID) | ORAL | 0 refills | Status: DC | PRN

## 2017-07-07 MED ORDER — PROMETHAZINE HCL 25 MG PO TABS
12.5000 mg | ORAL_TABLET | Freq: Once | ORAL | Status: AC
  Administered 2017-07-07: 12.5 mg via ORAL
  Filled 2017-07-07: qty 1

## 2017-07-07 NOTE — ED Provider Notes (Signed)
[facility] Emergency Department Provider Note    None    (approximate)  I have reviewed the triage vital signs and the nursing notes.   HISTORY  Chief Complaint Abdominal Pain    HPI Nathan Koch is a 27 y.o. male with a history of anxiety depression presents with 4 days of nasal congestion and crampy abdominal pain associated with nausea and nonbloody emesis.  Patient does endorse drinking alcohol frequently denies any blood in his vomit.  No blood in his stools.  No diarrhea.  No measured fevers.  No chest pain or shortness of breath.  Is been able to eat and drink but does have worsening nausea.  Past Medical History:  Diagnosis Date  . Anxiety   . Asthma   . Depression    Family History  Problem Relation Age of Onset  . Asthma Mother   . Asthma Brother    History reviewed. No pertinent surgical history. Patient Active Problem List   Diagnosis Date Noted  . Asthma 06/26/2014      Prior to Admission medications   Medication Sig Start Date End Date Taking? Authorizing Provider  albuterol (PROVENTIL HFA;VENTOLIN HFA) 108 (90 Base) MCG/ACT inhaler Inhale 2 puffs into the lungs every 4 (four) hours as needed for wheezing or shortness of breath. 05/31/17   Cuthriell, Delorise Royals, PA-C  Fluticasone-Salmeterol (ADVAIR DISKUS) 250-50 MCG/DOSE AEPB Inhale 1 puff into the lungs 2 (two) times daily. 05/31/17   Cuthriell, Delorise Royals, PA-C  ondansetron (ZOFRAN) 4 MG tablet Take 1 tablet (4 mg total) by mouth daily as needed for nausea or vomiting. 01/28/15   Jene Every, MD  predniSONE (DELTASONE) 50 MG tablet Take 1 tablet (50 mg total) by mouth daily with breakfast. 05/31/17   Cuthriell, Delorise Royals, PA-C    Allergies Patient has no known allergies.    Social History Social History   Tobacco Use  . Smoking status: Former Smoker    Last attempt to quit: 03/05/2016    Years since quitting: 1.3  . Smokeless tobacco: Never Used  Substance Use  Topics  . Alcohol use: Yes    Alcohol/week: 4.2 oz    Types: 7 Standard drinks or equivalent per week  . Drug use: No    Review of Systems Patient denies headaches, rhinorrhea, blurry vision, numbness, shortness of breath, chest pain, edema, cough, abdominal pain, nausea, vomiting, diarrhea, dysuria, fevers, rashes or hallucinations unless otherwise stated above in HPI. ____________________________________________   PHYSICAL EXAM:  VITAL SIGNS: Vitals:   07/07/17 1603  BP: (!) 143/64  Pulse: 75  Resp: 18  Temp: 98.2 F (36.8 C)  SpO2: 98%    Constitutional: Alert and oriented. Well appearing and in no acute distress. Eyes: Conjunctivae are normal.  Head: Atraumatic. Nose: No congestion/rhinnorhea. Mouth/Throat: Mucous membranes are moist.   Neck: No stridor. Painless ROM.  Cardiovascular: Normal rate, regular rhythm. Grossly normal heart sounds.  Good peripheral circulation. Respiratory: Normal respiratory effort.  No retractions. Lungs CTAB. Gastrointestinal: Soft and nontender. No distention. No abdominal bruits. No CVA tenderness. Genitourinary:  Musculoskeletal: No lower extremity tenderness nor edema.  No joint effusions. Neurologic:  Normal speech and language. No gross focal neurologic deficits are appreciated. No facial droop Skin:  Skin is warm, dry and intact. No rash noted. Psychiatric: Mood and affect are normal. Speech and behavior are normal.  ____________________________________________   LABS (all labs ordered are listed, but only abnormal results are displayed)  Results for orders placed or  performed during the hospital encounter of 07/07/17 (from the past 24 hour(s))  Lipase, blood     Status: None   Collection Time: 07/07/17  4:07 PM  Result Value Ref Range   Lipase 34 11 - 51 U/L  Comprehensive metabolic panel     Status: Abnormal   Collection Time: 07/07/17  4:07 PM  Result Value Ref Range   Sodium 139 135 - 145 mmol/L   Potassium 3.8 3.5 -  5.1 mmol/L   Chloride 107 101 - 111 mmol/L   CO2 25 22 - 32 mmol/L   Glucose, Bld 107 (H) 65 - 99 mg/dL   BUN 9 6 - 20 mg/dL   Creatinine, Ser 9.56 0.61 - 1.24 mg/dL   Calcium 9.2 8.9 - 21.3 mg/dL   Total Protein 7.3 6.5 - 8.1 g/dL   Albumin 4.0 3.5 - 5.0 g/dL   AST 27 15 - 41 U/L   ALT 28 17 - 63 U/L   Alkaline Phosphatase 68 38 - 126 U/L   Total Bilirubin 1.0 0.3 - 1.2 mg/dL   GFR calc non Af Amer >60 >60 mL/min   GFR calc Af Amer >60 >60 mL/min   Anion gap 7 5 - 15  CBC     Status: Abnormal   Collection Time: 07/07/17  4:07 PM  Result Value Ref Range   WBC 10.9 (H) 3.8 - 10.6 K/uL   RBC 5.19 4.40 - 5.90 MIL/uL   Hemoglobin 15.4 13.0 - 18.0 g/dL   HCT 08.6 57.8 - 46.9 %   MCV 86.9 80.0 - 100.0 fL   MCH 29.6 26.0 - 34.0 pg   MCHC 34.1 32.0 - 36.0 g/dL   RDW 62.9 52.8 - 41.3 %   Platelets 279 150 - 440 K/uL  Urinalysis, Complete w Microscopic     Status: Abnormal   Collection Time: 07/07/17  4:07 PM  Result Value Ref Range   Color, Urine YELLOW (A) YELLOW   APPearance CLEAR (A) CLEAR   Specific Gravity, Urine 1.014 1.005 - 1.030   pH 5.0 5.0 - 8.0   Glucose, UA NEGATIVE NEGATIVE mg/dL   Hgb urine dipstick NEGATIVE NEGATIVE   Bilirubin Urine NEGATIVE NEGATIVE   Ketones, ur NEGATIVE NEGATIVE mg/dL   Protein, ur NEGATIVE NEGATIVE mg/dL   Nitrite NEGATIVE NEGATIVE   Leukocytes, UA NEGATIVE NEGATIVE   RBC / HPF 0-5 0 - 5 RBC/hpf   WBC, UA 0-5 0 - 5 WBC/hpf   Bacteria, UA RARE (A) NONE SEEN   Squamous Epithelial / LPF NONE SEEN NONE SEEN   ____________________________________________  EKG  ____________________________________________  RADIOLOGY   ____________________________________________   PROCEDURES  Procedure(s) performed:  Procedures    Critical Care performed: no ____________________________________________   INITIAL IMPRESSION / ASSESSMENT AND PLAN / ED COURSE  Pertinent labs & imaging results that were available during my care of the patient  were reviewed by me and considered in my medical decision making (see chart for details).  DDX: Gastritis, enteritis, flulike illness, IBD, pancreatitis  Nathan Koch is a 27 y.o. who presents to the ED with symptoms as described above.  Patient well-appearing in no acute distress.  Does have mild borderline elevated leukocytosis most likely secondary to emesis.  His abdominal exam is soft and benign.  Primarily suspect some component of viral illness or gastritis.  At this point do not feel that emergent CT imaging clinically indicated.  Patient appears well perfused.  Does not require IV hydration at this time.  Patient will be given Phenergan and discussed strict return precautions including follow-up for reevaluation in 12-24 hours if symptoms not improved.      ____________________________________________   FINAL CLINICAL IMPRESSION(S) / ED DIAGNOSES  Final diagnoses:  Generalized abdominal pain      NEW MEDICATIONS STARTED DURING THIS VISIT:  New Prescriptions   No medications on file     Note:  This document was prepared using Dragon voice recognition software and may include unintentional dictation errors.    Willy Eddyobinson, Melecio Cueto, MD 07/07/17 1723

## 2017-07-07 NOTE — ED Triage Notes (Signed)
C/o abdominal pain X 3 days. Sometimes is is on the right, sometimes left, and sometimes in the middle per pt.  Spit up some clear stuff but no vomiting. No diarrhea.  No fevers.  No urinary sx.  Ambulatory. VSS. NAD

## 2017-07-07 NOTE — Discharge Instructions (Signed)

## 2017-07-07 NOTE — ED Notes (Signed)

## 2017-10-10 ENCOUNTER — Encounter: Payer: Self-pay | Admitting: Emergency Medicine

## 2017-10-10 ENCOUNTER — Other Ambulatory Visit: Payer: Self-pay

## 2017-10-10 ENCOUNTER — Emergency Department
Admission: EM | Admit: 2017-10-10 | Discharge: 2017-10-10 | Disposition: A | Discharge status: Home / Self Care | Payer: Self-pay | Attending: Emergency Medicine | Admitting: Emergency Medicine

## 2017-10-10 DIAGNOSIS — Z79899 Other long term (current) drug therapy: Secondary | ICD-10-CM | POA: Insufficient documentation

## 2017-10-10 DIAGNOSIS — J45909 Unspecified asthma, uncomplicated: Secondary | ICD-10-CM | POA: Insufficient documentation

## 2017-10-10 DIAGNOSIS — Z87891 Personal history of nicotine dependence: Secondary | ICD-10-CM | POA: Insufficient documentation

## 2017-10-10 DIAGNOSIS — Z760 Encounter for issue of repeat prescription: Secondary | ICD-10-CM | POA: Insufficient documentation

## 2017-10-10 MED ORDER — ALBUTEROL SULFATE HFA 108 (90 BASE) MCG/ACT IN AERS: 2.0000 | INHALATION_SPRAY | RESPIRATORY_TRACT | 2 refills | Status: DC | PRN

## 2017-10-10 MED ORDER — FLUTICASONE-SALMETEROL 250-50 MCG/DOSE IN AEPB: 1.0000 | INHALATION_SPRAY | Freq: Two times a day (BID) | RESPIRATORY_TRACT | 2 refills | Status: DC

## 2017-10-10 MED ORDER — IPRATROPIUM-ALBUTEROL 0.5-2.5 (3) MG/3ML IN SOLN
3.0000 mL | Freq: Once | RESPIRATORY_TRACT | Status: AC
  Administered 2017-10-10: 3 mL via RESPIRATORY_TRACT
  Filled 2017-10-10: qty 3

## 2017-10-10 NOTE — ED Provider Notes (Signed)
[facility] Emergency Department Provider Note  ____________________________________________  Time seen: Approximately 9:39 AM  I have reviewed the triage vital signs and the nursing notes.   HISTORY  Chief Complaint Asthma   HPI Nathan Koch is a 27 y.o. male who presents to the emergency department for refill of his albuterol and advair. He used the last bit of his albuterol this morning. He reports his asthma is at his baseline, but states that he uses it multiple times per day and has for "years."     Past Medical History:  Diagnosis Date  . Anxiety   . Asthma   . Depression     Patient Active Problem List   Diagnosis Date Noted  . Asthma 06/26/2014    History reviewed. No pertinent surgical history.  Prior to Admission medications   Medication Sig Start Date End Date Taking? Authorizing Provider  albuterol (PROVENTIL HFA;VENTOLIN HFA) 108 (90 Base) MCG/ACT inhaler Inhale 2 puffs into the lungs every 4 (four) hours as needed for wheezing or shortness of breath. 10/10/17   Yussuf Sawyers B, FNP  Fluticasone-Salmeterol (ADVAIR DISKUS) 250-50 MCG/DOSE AEPB Inhale 1 puff into the lungs 2 (two) times daily. 10/10/17   Yasmin Bronaugh B, FNP  ondansetron (ZOFRAN) 4 MG tablet Take 1 tablet (4 mg total) by mouth daily as needed for nausea or vomiting. 01/28/15   Jene Every, MD  predniSONE (DELTASONE) 50 MG tablet Take 1 tablet (50 mg total) by mouth daily with breakfast. 05/31/17   Cuthriell, Delorise Royals, PA-C  promethazine (PHENERGAN) 12.5 MG tablet Take 1 tablet (12.5 mg total) by mouth every 6 (six) hours as needed for nausea or vomiting. 07/07/17   Willy Eddy, MD    Allergies Patient has no known allergies.  Family History  Problem Relation Age of Onset  . Asthma Mother   . Asthma Brother     Social History Social History   Tobacco Use  . Smoking status: Former Smoker    Last attempt to quit: 03/05/2016    Years since quitting: 1.6   . Smokeless tobacco: Never Used  Substance Use Topics  . Alcohol use: Yes    Alcohol/week: 4.2 oz    Types: 7 Standard drinks or equivalent per week  . Drug use: No    Review of Systems Constitutional: Negative for fever/chills ENT:  sore throat. Cardiovascular: Denies chest pain. Respiratory: Negative for shortness of breath. Negative for cough. Positive for wheezing. Gastrointestinal: Negative nausea,  no vomiting.  no diarrhea.  Musculoskeletal: Negative for body aches Skin: negative for rash. Neurological: Negative for headaches ____________________________________________   PHYSICAL EXAM:  VITAL SIGNS: ED Triage Vitals  Enc Vitals Group     BP 10/10/17 0932 124/88     Pulse Rate 10/10/17 0932 (!) 120     Resp 10/10/17 0932 18     Temp 10/10/17 0932 98.3 F (36.8 C)     Temp Source 10/10/17 0932 Oral     SpO2 10/10/17 0932 98 %     Weight 10/10/17 0929 270 lb (122.5 kg)     Height 10/10/17 0929 5\' 8"  (1.727 m)     Head Circumference --      Peak Flow --      Pain Score 10/10/17 0929 6     Pain Loc --      Pain Edu? --      Excl. in GC? --     Constitutional: Alert and oriented. Well appearing and in no acute  distress. Eyes: Conjunctivae are normal. EOMI. Nose: no congestion noted; no rhinnorhea. Mouth/Throat: Mucous membranes are moist. Airway is patent. Neck: No stridor.  Lymphatic: No cervical lymphadenopathy. Cardiovascular: Normal rate, regular rhythm. Good peripheral circulation. Respiratory: Normal respiratory effort.  No retractions. Breath sounds diminished with faint expiratory wheeze scattered throughout.. Gastrointestinal: Soft and nontender.  Musculoskeletal: FROM x 4 extremities.  Neurologic:  Normal speech and language.  Skin:  Skin is warm, dry and intact. No rash noted. Psychiatric: Mood and affect are normal. Speech and behavior are normal.  ____________________________________________   LABS (all labs ordered are listed, but only  abnormal results are displayed)  Labs Reviewed - No data to display ____________________________________________  EKG  Not indicated. ____________________________________________  RADIOLOGY  Not indicated. ____________________________________________   PROCEDURES  Procedure(s) performed: None  Critical Care performed: No ____________________________________________   INITIAL IMPRESSION / ASSESSMENT AND PLAN / ED COURSE  27 y.o. male who presents to the emergency deparmtment for refill of his proair and advair. While here, he was given a duoneb treatment with relief. Prescriptions were given. Of note, the patient states that he donated plasma yesterday and had an issue with his heart rate going up afterward. He states he is unable to donate again for awhile. He denies other complaints.   Medications  ipratropium-albuterol (DUONEB) 0.5-2.5 (3) MG/3ML nebulizer solution 3 mL (3 mLs Nebulization Given 10/10/17 0946)    ED Discharge Orders        Ordered    albuterol (PROVENTIL HFA;VENTOLIN HFA) 108 (90 Base) MCG/ACT inhaler  Every 4 hours PRN     10/10/17 1041    Fluticasone-Salmeterol (ADVAIR DISKUS) 250-50 MCG/DOSE AEPB  2 times daily     10/10/17 1041       Pertinent labs & imaging results that were available during my care of the patient were reviewed by me and considered in my medical decision making (see chart for details).    If controlled substance prescribed during this visit, 12 month history viewed on the [place] prior to issuing an initial prescription for Schedule II or III opiod. ____________________________________________   FINAL CLINICAL IMPRESSION(S) / ED DIAGNOSES  Final diagnoses:  Persistent asthma without complication, unspecified asthma severity    Note:  This document was prepared using Dragon voice recognition software and may include unintentional dictation errors.     Chinita Pesterriplett, Travaughn Vue B, FNP 10/10/17 1052    Rockne MenghiniNorman, Anne-Caroline,  MD 10/10/17 1513

## 2017-10-10 NOTE — ED Notes (Signed)
See triage note  States he ran out of his inhalers this am  Has had some SOB,wheezing   States he need Advair and albuterol inhaler  No fever  Occasional cough

## 2017-10-10 NOTE — ED Triage Notes (Signed)
Says asthma attack for 43 min.  Says out of meds.  Patient is in nad.  Speaks without difficulty

## 2017-10-18 ENCOUNTER — Other Ambulatory Visit: Payer: Self-pay

## 2017-10-18 ENCOUNTER — Emergency Department
Admission: EM | Admit: 2017-10-18 | Discharge: 2017-10-18 | Disposition: A | Discharge status: Home / Self Care | Payer: Self-pay | Attending: Emergency Medicine | Admitting: Emergency Medicine

## 2017-10-18 ENCOUNTER — Encounter: Payer: Self-pay | Admitting: Emergency Medicine

## 2017-10-18 DIAGNOSIS — T675XXA Heat exhaustion, unspecified, initial encounter: Secondary | ICD-10-CM | POA: Insufficient documentation

## 2017-10-18 DIAGNOSIS — J45909 Unspecified asthma, uncomplicated: Secondary | ICD-10-CM | POA: Insufficient documentation

## 2017-10-18 DIAGNOSIS — Z87891 Personal history of nicotine dependence: Secondary | ICD-10-CM | POA: Insufficient documentation

## 2017-10-18 DIAGNOSIS — F419 Anxiety disorder, unspecified: Secondary | ICD-10-CM | POA: Insufficient documentation

## 2017-10-18 DIAGNOSIS — F329 Major depressive disorder, single episode, unspecified: Secondary | ICD-10-CM | POA: Insufficient documentation

## 2017-10-18 DIAGNOSIS — E860 Dehydration: Secondary | ICD-10-CM | POA: Insufficient documentation

## 2017-10-18 LAB — CBC
HEMATOCRIT: 52.5 % — AB (ref 40.0–52.0)
HEMOGLOBIN: 17.8 g/dL (ref 13.0–18.0)
MCH: 30.3 pg (ref 26.0–34.0)
MCHC: 33.9 g/dL (ref 32.0–36.0)
MCV: 89.3 fL (ref 80.0–100.0)
Platelets: 290 10*3/uL (ref 150–440)
RBC: 5.88 MIL/uL (ref 4.40–5.90)
RDW: 14.8 % — AB (ref 11.5–14.5)
WBC: 14 10*3/uL — ABNORMAL HIGH (ref 3.8–10.6)

## 2017-10-18 LAB — BASIC METABOLIC PANEL
Anion gap: 14 (ref 5–15)
BUN: 10 mg/dL (ref 6–20)
CHLORIDE: 100 mmol/L — AB (ref 101–111)
CO2: 22 mmol/L (ref 22–32)
Calcium: 9.4 mg/dL (ref 8.9–10.3)
Creatinine, Ser: 0.91 mg/dL (ref 0.61–1.24)
GFR calc Af Amer: >60 mL/min (ref >60)
GFR calc non Af Amer: >60 mL/min (ref >60)
GLUCOSE: 109 mg/dL — AB (ref 65–99)
POTASSIUM: 4 mmol/L (ref 3.5–5.1)
Sodium: 136 mmol/L (ref 135–145)

## 2017-10-18 LAB — URINALYSIS, COMPLETE (UACMP) WITH MICROSCOPIC
BACTERIA UA: NONE SEEN
BILIRUBIN URINE: NEGATIVE
Glucose, UA: NEGATIVE mg/dL
Ketones, ur: 20 mg/dL — AB
LEUKOCYTES UA: NEGATIVE
Nitrite: NEGATIVE
PROTEIN: NEGATIVE mg/dL
SPECIFIC GRAVITY, URINE: 1.008 (ref 1.005–1.030)
pH: 6 (ref 5.0–8.0)

## 2017-10-18 LAB — CK: Total CK: 458 U/L — ABNORMAL HIGH (ref 49–397)

## 2017-10-18 MED ORDER — SODIUM CHLORIDE 0.9 % IV SOLN
Freq: Once | INTRAVENOUS | Status: AC
  Administered 2017-10-18: 11:00:00 via INTRAVENOUS

## 2017-10-18 NOTE — ED Triage Notes (Signed)
Says he thinks he may have had a mini stroke.  Says he was working out in sun yesterday and had episode where  He had abd cramping, low mid back pain and then his heart was pounding and his right arm became numb.  He says it still feels weak today, and also has weakness in left leg.

## 2017-10-18 NOTE — ED Provider Notes (Signed)
[facility] Emergency Department Provider Note       Time seen: ----------------------------------------- 10:31 AM on 10/18/2017 -----------------------------------------   I have reviewed the triage vital signs and the nursing notes.  HISTORY   Chief Complaint Weakness; Abdominal Cramping; and Back Pain    HPI CEBASTIAN NEIS is a 27 y.o. male with a history of anxiety, asthma and depression who presents to the ED for weakness.  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  He was having abdominal cramping as well as low back pain and right arm numbness.  Patient states he has been having some weakness in the left leg.  Also states he donates plasma.  He denies a history of these symptoms before.  Past Medical History:  Diagnosis Date  . Anxiety   . Asthma   . Depression     Patient Active Problem List   Diagnosis Date Noted  . Asthma 06/26/2014    History reviewed. No pertinent surgical history.  Allergies Patient has no known allergies.  Social History Social History   Tobacco Use  . Smoking status: Former Smoker    Last attempt to quit: 03/05/2016    Years since quitting: 1.6  . Smokeless tobacco: Never Used  Substance Use Topics  . Alcohol use: Yes    Alcohol/week: 4.2 oz    Types: 7 Standard drinks or equivalent per week  . Drug use: No   Review of Systems Constitutional: Negative for fever. Cardiovascular: Negative for chest pain. Respiratory: Negative for shortness of breath. Gastrointestinal: Negative for abdominal pain, vomiting and diarrhea. Musculoskeletal: Positive for recent back pain Skin: Negative for rash. Neurological: Positive for weakness and numbness  All systems negative/normal/unremarkable except as stated in the HPI  ____________________________________________   PHYSICAL EXAM:  VITAL SIGNS: ED Triage Vitals [10/18/17 0943]  Enc Vitals Group     BP 135/80     Pulse Rate 78     Resp 18     Temp 98.1 F (36.7 C)     Temp Source Oral     SpO2 98 %     Weight 270 lb (122.5 kg)     Height 5\' 9"  (1.753 m)     Head Circumference      Peak Flow      Pain Score      Pain Loc      Pain Edu?      Excl. in GC?    Constitutional: Alert and oriented. Well appearing and in no distress. Eyes: Conjunctivae are normal. Normal extraocular movements. ENT   Head: Normocephalic and atraumatic.   Nose: No congestion/rhinnorhea.   Mouth/Throat: Mucous membranes are moist.   Neck: No stridor. Cardiovascular: Normal rate, regular rhythm. No murmurs, rubs, or gallops. Respiratory: Normal respiratory effort without tachypnea nor retractions. Breath sounds are clear and equal bilaterally. No wheezes/rales/rhonchi. Gastrointestinal: Soft and nontender. Normal bowel sounds Musculoskeletal: Nontender with normal range of motion in extremities. No lower extremity tenderness nor edema. Neurologic:  Normal speech and language. No gross focal neurologic deficits are appreciated.  Strength, sensation, cranial nerves appear to be normal Skin:  Skin is warm, dry and intact. No rash noted. Psychiatric: Mood and affect are normal. Speech and behavior are normal.  ____________________________________________  EKG: Interpreted by me.  Sinus rhythm rate 82 bpm, normal PR interval, normal QRS, normal QT  ____________________________________________  ED COURSE:  As part of my medical decision making, I reviewed the following  data within the electronic MEDICAL RECORD NUMBER History obtained from family if available, nursing notes, old chart and ekg, as well as notes from prior ED visits. Patient presented for weakness likely heat related illness, we will assess with labs as indicated at this time.   Procedures ____________________________________________   LABS (pertinent positives/negatives)  Labs Reviewed  BASIC METABOLIC PANEL - Abnormal; Notable for the  following components:      Result Value   Chloride 100 (*)    Glucose, Bld 109 (*)    All other components within normal limits  CBC - Abnormal; Notable for the following components:   WBC 14.0 (*)    HCT 52.5 (*)    RDW 14.8 (*)    All other components within normal limits  URINALYSIS, COMPLETE (UACMP) WITH MICROSCOPIC - Abnormal; Notable for the following components:   Color, Urine YELLOW (*)    APPearance CLEAR (*)    Hgb urine dipstick MODERATE (*)    Ketones, ur 20 (*)    All other components within normal limits  CK - Abnormal; Notable for the following components:   Total CK 458 (*)    All other components within normal limits   ____________________________________________  DIFFERENTIAL DIAGNOSIS   Heat related illness, dehydration, electrolyte abnormality, CVA unlikely  FINAL ASSESSMENT AND PLAN  Heat related illness   Plan: The patient had presented for symptoms that he had yesterday after he got overheated and likely dehydrated. Patient's labs did reveal dehydration suggestive of a recent heat illness.  Patient has no acute neurologic symptoms currently and is feeling better after IV fluids.  He is cleared for outpatient follow-up.   Ulice DashJohnathan E Marixa Mellott, MD   Note: This note was generated in part or whole with voice recognition software. Voice recognition is usually quite accurate but there are transcription errors that can and very often do occur. I apologize for any typographical errors that were not detected and corrected.     Emily FilbertWilliams, Kameko Hukill E, MD 10/18/17 [id number]

## 2017-10-28 ENCOUNTER — Emergency Department
Admission: EM | Admit: 2017-10-28 | Discharge: 2017-10-29 | Disposition: A | Discharge status: Home / Self Care | Payer: Self-pay | Attending: Emergency Medicine | Admitting: Emergency Medicine

## 2017-10-28 ENCOUNTER — Emergency Department: Payer: Self-pay

## 2017-10-28 ENCOUNTER — Other Ambulatory Visit: Payer: Self-pay

## 2017-10-28 ENCOUNTER — Encounter: Payer: Self-pay | Admitting: Emergency Medicine

## 2017-10-28 DIAGNOSIS — J45909 Unspecified asthma, uncomplicated: Secondary | ICD-10-CM | POA: Insufficient documentation

## 2017-10-28 DIAGNOSIS — M26623 Arthralgia of bilateral temporomandibular joint: Secondary | ICD-10-CM | POA: Insufficient documentation

## 2017-10-28 DIAGNOSIS — Z79899 Other long term (current) drug therapy: Secondary | ICD-10-CM | POA: Insufficient documentation

## 2017-10-28 DIAGNOSIS — F10920 Alcohol use, unspecified with intoxication, uncomplicated: Secondary | ICD-10-CM | POA: Insufficient documentation

## 2017-10-28 DIAGNOSIS — Z87891 Personal history of nicotine dependence: Secondary | ICD-10-CM | POA: Insufficient documentation

## 2017-10-28 LAB — BASIC METABOLIC PANEL
Anion gap: 9 (ref 5–15)
CO2: 26 mmol/L (ref 22–32)
CREATININE: 0.99 mg/dL (ref 0.61–1.24)
Calcium: 8.9 mg/dL (ref 8.9–10.3)
Chloride: 106 mmol/L (ref 98–111)
GFR calc Af Amer: >60 mL/min (ref >60)
GFR calc non Af Amer: >60 mL/min (ref >60)
GLUCOSE: 110 mg/dL — AB (ref 70–99)
Potassium: 3.6 mmol/L (ref 3.5–5.1)
Sodium: 141 mmol/L (ref 135–145)

## 2017-10-28 LAB — ETHANOL: ALCOHOL ETHYL (B): 280 mg/dL — AB (ref <10)

## 2017-10-28 NOTE — ED Notes (Addendum)
Pt uprite on stretcher in exam room with no distress noted; Dr Fanny BienQuale at bedside; pt reports fx his jaw yr ago and since has episodes where "it locks up"; pt talkative with slurred speech but indicates that he cannot close his mouth completely; pt noted occasionally bringing his lips together without difficulty; denies any specific injury tonight, st he was eating a cheeseburger when it locked up; pt admits to drinking 4 beers PTA (st 5hrs ago) and last food intake 3hrs ago

## 2017-10-28 NOTE — ED Notes (Signed)
Pt to xray via stretcher accomp by xray tech 

## 2017-10-28 NOTE — ED Triage Notes (Signed)
Ems pt to room 3 , jaw pain , prior fx years ago , occasionally will lock up and pt unable close jaw.

## 2017-10-29 ENCOUNTER — Encounter: Payer: Self-pay | Admitting: Emergency Medicine

## 2017-10-29 ENCOUNTER — Emergency Department
Admission: EM | Admit: 2017-10-29 | Discharge: 2017-10-29 | Disposition: A | Discharge status: Home / Self Care | Payer: Self-pay | Attending: Emergency Medicine | Admitting: Emergency Medicine

## 2017-10-29 ENCOUNTER — Other Ambulatory Visit: Payer: Self-pay

## 2017-10-29 DIAGNOSIS — S0300XD Dislocation of jaw, unspecified side, subsequent encounter: Secondary | ICD-10-CM | POA: Insufficient documentation

## 2017-10-29 DIAGNOSIS — Z79899 Other long term (current) drug therapy: Secondary | ICD-10-CM | POA: Insufficient documentation

## 2017-10-29 DIAGNOSIS — Z87891 Personal history of nicotine dependence: Secondary | ICD-10-CM | POA: Insufficient documentation

## 2017-10-29 DIAGNOSIS — IMO0001 Reserved for inherently not codable concepts without codable children: Secondary | ICD-10-CM

## 2017-10-29 DIAGNOSIS — X58XXXD Exposure to other specified factors, subsequent encounter: Secondary | ICD-10-CM | POA: Insufficient documentation

## 2017-10-29 DIAGNOSIS — J45909 Unspecified asthma, uncomplicated: Secondary | ICD-10-CM | POA: Insufficient documentation

## 2017-10-29 MED ORDER — CYCLOBENZAPRINE HCL 5 MG PO TABS: 5.0000 mg | ORAL_TABLET | Freq: Three times a day (TID) | ORAL | 0 refills | Status: DC | PRN

## 2017-10-29 MED ORDER — DIAZEPAM 5 MG PO TABS
5.0000 mg | ORAL_TABLET | Freq: Once | ORAL | Status: AC
  Administered 2017-10-29: 5 mg via ORAL
  Filled 2017-10-29: qty 1

## 2017-10-29 MED ORDER — KETOROLAC TROMETHAMINE 30 MG/ML IJ SOLN
30.0000 mg | Freq: Once | INTRAMUSCULAR | Status: AC
  Administered 2017-10-29: 30 mg via INTRAMUSCULAR
  Filled 2017-10-29: qty 1

## 2017-10-29 NOTE — ED Notes (Signed)
Pt ambulatory without difficulty, steady gait, no distress noted; pt denies any c/o; calling for transportation home

## 2017-10-29 NOTE — ED Provider Notes (Signed)
[facility] Emergency Department Provider Note   ____________________________________________   First MD Initiated Contact with Patient 10/28/17 2131     (approximate)  I have reviewed the triage vital signs and the nursing notes.   HISTORY  Chief Complaint Jaw Pain    HPI Nathan Koch is a 27 y.o. male here for evaluation for jaw pain.  Patient reports he was eating a hamburger at about 5 PM when he opened his mouth really wide and felt like started having pain over both sides of his jaw and was not able to close his mouth all the way.  Worse over the left side, patient pointing at his left TMJ region.  No fall or injury but he did break his jaw about 2 years ago and reports he had problems with the jaw ever since.  Tonight he reports he last had some alcohol at about 5 PM, reports an achy pain.  No fevers or chills no nausea vomiting.  No dental pain.  No neck pain.  Reports all the pain is in his jaw joint when he tries to open and close both sides.  Past Medical History:  Diagnosis Date  . Anxiety   . Asthma   . Depression     Patient Active Problem List   Diagnosis Date Noted  . Asthma 06/26/2014    History reviewed. No pertinent surgical history.  Prior to Admission medications   Medication Sig Start Date End Date Taking? Authorizing Provider  albuterol (PROVENTIL HFA;VENTOLIN HFA) 108 (90 Base) MCG/ACT inhaler Inhale 2 puffs into the lungs every 4 (four) hours as needed for wheezing or shortness of breath. 10/10/17   Triplett, Cari B, FNP  Fluticasone-Salmeterol (ADVAIR DISKUS) 250-50 MCG/DOSE AEPB Inhale 1 puff into the lungs 2 (two) times daily. 10/10/17   Triplett, Cari B, FNP  ondansetron (ZOFRAN) 4 MG tablet Take 1 tablet (4 mg total) by mouth daily as needed for nausea or vomiting. 01/28/15   Jene Every, MD  predniSONE (DELTASONE) 50 MG tablet Take 1 tablet (50 mg total) by mouth daily with breakfast. 05/31/17   Cuthriell, Delorise Royals, PA-C  promethazine (PHENERGAN) 12.5 MG tablet Take 1 tablet (12.5 mg total) by mouth every 6 (six) hours as needed for nausea or vomiting. 07/07/17   Willy Eddy, MD    Allergies Patient has no known allergies.  Family History  Problem Relation Age of Onset  . Asthma Mother   . Asthma Brother     Social History Social History   Tobacco Use  . Smoking status: Former Smoker    Last attempt to quit: 03/05/2016    Years since quitting: 1.6  . Smokeless tobacco: Never Used  Substance Use Topics  . Alcohol use: Yes    Alcohol/week: 4.2 oz    Types: 7 Standard drinks or equivalent per week  . Drug use: No    Review of Systems Constitutional: No fever/chills Eyes: No visual changes. ENT: No sore throat.  See HPI. Cardiovascular: Denies chest pain. Respiratory: Denies shortness of breath. Gastrointestinal: No abdominal pain.   Skin: Negative for rash. Neurological: Negative for headaches or weakness.   ____________________________________________   PHYSICAL EXAM:  VITAL SIGNS: ED Triage Vitals  Enc Vitals Group     BP 10/28/17 2126 (!) 140/92     Pulse Rate 10/28/17 2126 100     Resp 10/28/17 2126 18     Temp 10/28/17 2126 98.2 F (36.8 C)     Temp Source  10/28/17 2126 Oral     SpO2 10/28/17 2126 97 %     Weight 10/28/17 2127 270 lb (122.5 kg)     Height 10/28/17 2127 5\' 8"  (1.727 m)     Head Circumference --      Peak Flow --      Pain Score 10/28/17 2126 10     Pain Loc --      Pain Edu? --      Excl. in GC? --     Constitutional: Alert and oriented. Well appearing and in no acute distress.  Oddly, when initially walked in the room it seemed the patient was able to close his jaw all the way return after talking to him he started holding it open reporting he could not close it all the way, just somewhat hard to describe with this presentation seems inconsistent, at times while he is talking about not being able to close his jaw he seems he is able to  fully close it. Eyes: Conjunctivae are lightly injected.  Slight lateral gaze nystagmus is noted at times. Head: Atraumatic. Nose: No congestion/rhinnorhea. Mouth/Throat: Mucous membranes are moist.  Poor dentition without focal abnormality.  No anterior neck edema.  No focal swelling over the jaw.  No oral fistulas or mucosal breakdown noted.  Patient reports pain right over the TMJ joints bilaterally, but he is able to open and close the jaw and often holds the mouth open reporting he cannot close it well, but then when asked he is able to bite down and close his teeth.  Does not appear that there is any focal dental abnormality, cavity or abscess. Neck: No stridor.   Cardiovascular: Normal rate, regular rhythm. Grossly normal heart sounds.  Good peripheral circulation. Respiratory: Normal respiratory effort.  No retractions. Lungs CTAB. Gastrointestinal: Soft and nontender. No distention. Musculoskeletal: No lower extremity tenderness nor edema. Neurologic:  Normal speech and language though at times it seems to slightly slurred. No gross focal neurologic deficits are appreciated.  Skin:  Skin is warm, dry and intact. No rash noted. Psychiatric: Mood and affect are normal. Speech and behavior are normal.  ____________________________________________   LABS (all labs ordered are listed, but only abnormal results are displayed)  Labs Reviewed  ETHANOL - Abnormal; Notable for the following components:      Result Value   Alcohol, Ethyl (B) 280 (*)    All other components within normal limits  BASIC METABOLIC PANEL - Abnormal; Notable for the following components:   Glucose, Bld 110 (*)    BUN <5 (*)    All other components within normal limits   ____________________________________________  EKG   ____________________________________________  RADIOLOGY  Dg Mandible 1-3 Views  Result Date: 10/28/2017 CLINICAL DATA:  Patient fracture the jaw 1 year ago. Now has episodes where it  locks up. Slurred speech. No current injury. EXAM: MANDIBLE - 1-3 VIEW COMPARISON:  None. FINDINGS: The mandible appears intact. No acute displaced fractures are identified. No focal bone lesion or bone destruction. Limited visualization of the left TMJ due to positioning. Right TMJ is imaged in the open position. Soft tissues are unremarkable. IMPRESSION: No acute bony abnormalities identified. Limited examination due to positioning. Electronically Signed   By: Burman Nieves M.D.   On: 10/28/2017 22:10    ____________________________________________   PROCEDURES  Procedure(s) performed: None  Procedures  Critical Care performed: No  ____________________________________________   INITIAL IMPRESSION / ASSESSMENT AND PLAN / ED COURSE  Pertinent labs & imaging results  that were available during my care of the patient were reviewed by me and considered in my medical decision making (see chart for details).  Patient returns for evaluation of jaw pain after eating a large cheeseburger.  Initially his clinical history seem to suggest a partial or somewhat dislocation of the jaw, but then as I am speaking to him he is able to occlude the jaw.  There is no evidence of infection, question if he may have some loose cartilage or TMJ syndrome.  Overall very reassuring exam, also has some mild ataxia and slurring of speech and his blood work denotes notable ethanol use.  Patient after discussed with him reports that he did actually doing quite a bit more than he had originally admitted.  Reports he had several mixed drinks, some of which she had right before he came in.  Discussed this case with Dr. Alvino Chapelhoi of ENT, and he recommends that the patient be discharged can follow-up with your nose and throat.  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      ----------------------------------------- 12:19 AM on 10/29/2017 -----------------------------------------  Ongoing care assigned to Dr. Juliette AlcideMelinda, patient can be discharged once a sober ride home has been identified.  ____________________________________________   FINAL CLINICAL IMPRESSION(S) / ED DIAGNOSES  Final diagnoses:  Bilateral temporomandibular joint pain  Alcoholic intoxication without complication ([name])      NEW MEDICATIONS STARTED DURING THIS VISIT:  New Prescriptions   No medications on file     Note:  This document was prepared using Dragon voice recognition software and may include unintentional dictation errors.     Sharyn CreamerQuale, Haillie Radu, MD 10/29/17 [phone]

## 2017-10-29 NOTE — ED Triage Notes (Signed)
Says his jaw is locked again. Unable to put teeth together. Has had it happen before, butit ususally moves mouth around and it goes into place.

## 2017-10-29 NOTE — ED Provider Notes (Signed)
[facility] Emergency Department Provider Note ____________________________________________  Time seen: 1448  I have reviewed the triage vital signs and the nursing notes.  HISTORY  Chief Complaint  Jaw Pain  HPI Nathan Koch is a 27 y.o. male returns to the ED, via EMS from home, for evaluation of recurrent lower jaw dysfunction.  Patient reports that about 1:00 this morning, his jaw again became stuck in the open position.  Since that time, he has been unable to close the lower jaw completely.  The patient experienced a similar episode yesterday and was evaluated here after attempting to eat a large cheeseburger.  He apparently had resolution of his symptoms after his discharge from the ED.  He was advised to follow-up with the ear nose and throat for further evaluation and management.  He presents today for recurrence of TMJ dysfunction.  He denies any difficulty breathing, swallowing, or controlling secretions.  He also denies any trauma to the jaw.  Past Medical History:  Diagnosis Date  . Anxiety   . Asthma   . Depression     Patient Active Problem List   Diagnosis Date Noted  . Asthma 06/26/2014    History reviewed. No pertinent surgical history.  Prior to Admission medications   Medication Sig Start Date End Date Taking? Authorizing Provider  albuterol (PROVENTIL HFA;VENTOLIN HFA) 108 (90 Base) MCG/ACT inhaler Inhale 2 puffs into the lungs every 4 (four) hours as needed for wheezing or shortness of breath. 10/10/17   Triplett, Rulon Eisenmenger B, FNP  cyclobenzaprine (FLEXERIL) 5 MG tablet Take 1 tablet (5 mg total) by mouth 3 (three) times daily as needed for muscle spasms. 10/29/17   Abir Eroh, Charlesetta Ivory, PA-C  Fluticasone-Salmeterol (ADVAIR DISKUS) 250-50 MCG/DOSE AEPB Inhale 1 puff into the lungs 2 (two) times daily. 10/10/17   Triplett, Cari B, FNP  ondansetron (ZOFRAN) 4 MG tablet Take 1 tablet (4 mg total) by mouth daily as needed for nausea or vomiting.  01/28/15   Jene Every, MD  predniSONE (DELTASONE) 50 MG tablet Take 1 tablet (50 mg total) by mouth daily with breakfast. 05/31/17   Cuthriell, Delorise Royals, PA-C  promethazine (PHENERGAN) 12.5 MG tablet Take 1 tablet (12.5 mg total) by mouth every 6 (six) hours as needed for nausea or vomiting. 07/07/17   Willy Eddy, MD    Allergies Patient has no known allergies.  Family History  Problem Relation Age of Onset  . Asthma Mother   . Asthma Brother     Social History Social History   Tobacco Use  . Smoking status: Former Smoker    Last attempt to quit: 03/05/2016    Years since quitting: 1.6  . Smokeless tobacco: Never Used  Substance Use Topics  . Alcohol use: Yes    Alcohol/week: 4.2 oz    Types: 7 Standard drinks or equivalent per week  . Drug use: No    Review of Systems  Constitutional: Negative for fever. Eyes: Negative for visual changes. ENT: Negative for sore throat. Lower jaw dysfunction Cardiovascular: Negative for chest pain. Respiratory: Negative for shortness of breath. Musculoskeletal: Negative for back pain. Skin: Negative for rash. Neurological: Negative for headaches, focal weakness or numbness. ____________________________________________  PHYSICAL EXAM:  VITAL SIGNS: ED Triage Vitals [10/29/17 1410]  Enc Vitals Group     BP 118/75     Pulse Rate 98     Resp 16     Temp 97.7 F (36.5 C)     Temp Source Oral  SpO2 100 %     Weight 270 lb (122.5 kg)     Height 5\' 8"  (1.727 m)     Head Circumference      Peak Flow      Pain Score 9     Pain Loc      Pain Edu?      Excl. in GC?     Constitutional: Alert and oriented. Well appearing and in no distress. Head: Normocephalic and atraumatic. Eyes: Conjunctivae are normal. PERRL. Normal extraocular movements Ears: Canals clear. TMs intact bilaterally. Nose: No congestion/rhinorrhea/epistaxis. Mouth/Throat: Patient with a fixed occlusion deformity to the lower jaw.  Patient is unable to  actively clench the jaw on exam.  He is able to extend the lower jaw easily without difficulty.  Normal phonation without pooling of oral secretions noted. Patient is able to close the lips together.  Mucous membranes are moist. Uvula is midline and tonsils are flat. Poor dentition noted globally. No oral lesions. No oropharyngeal erythema or brawny sublingual edema noted.  Neck: Supple. No thyromegaly. Hematological/Lymphatic/Immunological: No cervical lymphadenopathy. Cardiovascular: Normal rate, regular rhythm. Normal distal pulses. Respiratory: Normal respiratory effort. No wheezes/rales/rhonchi. Musculoskeletal: Nontender with normal range of motion in all extremities.  Neurologic:  Normal gait without ataxia. Normal speech and language. No gross focal neurologic deficits are appreciated. ____________________________________________  PROCEDURES  Procedures Toradol 30 mg IM Valium 5 mg PO ____________________________________________  INITIAL IMPRESSION / ASSESSMENT AND PLAN / ED COURSE  After unsuccessful attempts to reduce the subluxation, the patient is discharged with a prescription for Flexeril. He will follow-up with ENT as directed. Return precautions have been reviewed.  ____________________________________________  FINAL CLINICAL IMPRESSION(S) / ED DIAGNOSES  Final diagnoses:  Recurrent subluxation of temporomandibular joint, subsequent encounter      Lissa HoardMenshew, Koichi Platte V Bacon, PA-C 10/29/17 1803    Sharman CheekStafford, Phillip, MD 10/29/17 2046

## 2017-10-29 NOTE — ED Provider Notes (Signed)
-----------------------------------------   2:02 AM on 10/29/2017 -----------------------------------------  Patient's right has just arrived I will discharge him as planned   Arnaldo NatalMalinda, Brittannie Tawney F, MD 10/29/17 0202

## 2017-10-29 NOTE — Discharge Instructions (Signed)
You have a subluxation to the lower jaw. Attempts to reduce it were not successful today. You do have control of your secretions and the ability to close your lips. Take the prescription meds as directed and apply ice to reduce pain and spasms. You must eat soft foods and avoid meats and chewing gum. Follow-up with ENT if your symptoms persist. Return to the ED as needed.

## 2017-11-14 ENCOUNTER — Emergency Department
Admission: EM | Admit: 2017-11-14 | Discharge: 2017-11-15 | Payer: Self-pay | Attending: Emergency Medicine | Admitting: Emergency Medicine

## 2017-11-14 ENCOUNTER — Emergency Department: Payer: Self-pay

## 2017-11-14 ENCOUNTER — Encounter: Payer: Self-pay | Admitting: Emergency Medicine

## 2017-11-14 ENCOUNTER — Emergency Department
Admission: EM | Admit: 2017-11-14 | Discharge: 2017-11-14 | Disposition: A | Discharge status: Home / Self Care | Payer: Self-pay | Attending: Emergency Medicine | Admitting: Emergency Medicine

## 2017-11-14 ENCOUNTER — Other Ambulatory Visit: Payer: Self-pay

## 2017-11-14 DIAGNOSIS — Z008 Encounter for other general examination: Secondary | ICD-10-CM | POA: Insufficient documentation

## 2017-11-14 DIAGNOSIS — J45909 Unspecified asthma, uncomplicated: Secondary | ICD-10-CM | POA: Insufficient documentation

## 2017-11-14 DIAGNOSIS — G4700 Insomnia, unspecified: Secondary | ICD-10-CM | POA: Insufficient documentation

## 2017-11-14 DIAGNOSIS — Z87891 Personal history of nicotine dependence: Secondary | ICD-10-CM | POA: Insufficient documentation

## 2017-11-14 DIAGNOSIS — F1510 Other stimulant abuse, uncomplicated: Secondary | ICD-10-CM | POA: Insufficient documentation

## 2017-11-14 DIAGNOSIS — F418 Other specified anxiety disorders: Secondary | ICD-10-CM | POA: Insufficient documentation

## 2017-11-14 DIAGNOSIS — F22 Delusional disorders: Secondary | ICD-10-CM | POA: Insufficient documentation

## 2017-11-14 DIAGNOSIS — F29 Unspecified psychosis not due to a substance or known physiological condition: Secondary | ICD-10-CM | POA: Insufficient documentation

## 2017-11-14 DIAGNOSIS — R002 Palpitations: Secondary | ICD-10-CM | POA: Insufficient documentation

## 2017-11-14 DIAGNOSIS — Z79899 Other long term (current) drug therapy: Secondary | ICD-10-CM | POA: Insufficient documentation

## 2017-11-14 DIAGNOSIS — F419 Anxiety disorder, unspecified: Secondary | ICD-10-CM

## 2017-11-14 LAB — COMPREHENSIVE METABOLIC PANEL
ALBUMIN: 4.3 g/dL (ref 3.5–5.0)
ALBUMIN: 4.2 g/dL (ref 3.5–5.0)
ALT: 30 U/L (ref 0–44)
ALT: 29 U/L (ref 0–44)
ANION GAP: 17 — AB (ref 5–15)
ANION GAP: 15 (ref 5–15)
AST: 29 U/L (ref 15–41)
AST: 29 U/L (ref 15–41)
Alkaline Phosphatase: 53 U/L (ref 38–126)
Alkaline Phosphatase: 52 U/L (ref 38–126)
BILIRUBIN TOTAL: 2.3 mg/dL — AB (ref 0.3–1.2)
BUN: 8 mg/dL (ref 6–20)
BUN: 7 mg/dL (ref 6–20)
CHLORIDE: 102 mmol/L (ref 98–111)
CHLORIDE: 106 mmol/L (ref 98–111)
CO2: 20 mmol/L — AB (ref 22–32)
CO2: 19 mmol/L — ABNORMAL LOW (ref 22–32)
Calcium: 9.7 mg/dL (ref 8.9–10.3)
Calcium: 9.3 mg/dL (ref 8.9–10.3)
Creatinine, Ser: 1.39 mg/dL — ABNORMAL HIGH (ref 0.61–1.24)
Creatinine, Ser: 0.94 mg/dL (ref 0.61–1.24)
GFR calc Af Amer: >60 mL/min (ref >60)
GFR calc Af Amer: >60 mL/min (ref >60)
GFR calc non Af Amer: >60 mL/min (ref >60)
GFR calc non Af Amer: >60 mL/min (ref >60)
GLUCOSE: 115 mg/dL — AB (ref 70–99)
GLUCOSE: 119 mg/dL — AB (ref 70–99)
POTASSIUM: 3.2 mmol/L — AB (ref 3.5–5.1)
POTASSIUM: 3.3 mmol/L — AB (ref 3.5–5.1)
Sodium: 139 mmol/L (ref 135–145)
Sodium: 140 mmol/L (ref 135–145)
TOTAL PROTEIN: 7.3 g/dL (ref 6.5–8.1)
Total Bilirubin: 2.4 mg/dL — ABNORMAL HIGH (ref 0.3–1.2)
Total Protein: 7.6 g/dL (ref 6.5–8.1)

## 2017-11-14 LAB — CBC
HEMATOCRIT: 50.4 % (ref 40.0–52.0)
HEMATOCRIT: 45.7 % (ref 40.0–52.0)
Hemoglobin: 17.3 g/dL (ref 13.0–18.0)
Hemoglobin: 15.9 g/dL (ref 13.0–18.0)
MCH: 30.1 pg (ref 26.0–34.0)
MCH: 30.3 pg (ref 26.0–34.0)
MCHC: 34.3 g/dL (ref 32.0–36.0)
MCHC: 34.7 g/dL (ref 32.0–36.0)
MCV: 87.8 fL (ref 80.0–100.0)
MCV: 87.3 fL (ref 80.0–100.0)
PLATELETS: 353 10*3/uL (ref 150–440)
Platelets: 331 10*3/uL (ref 150–440)
RBC: 5.74 MIL/uL (ref 4.40–5.90)
RBC: 5.24 MIL/uL (ref 4.40–5.90)
RDW: 14.1 % (ref 11.5–14.5)
RDW: 14 % (ref 11.5–14.5)
WBC: 11.5 10*3/uL — AB (ref 3.8–10.6)
WBC: 11.1 10*3/uL — ABNORMAL HIGH (ref 3.8–10.6)

## 2017-11-14 LAB — URINE DRUG SCREEN, QUALITATIVE (ARMC ONLY)
AMPHETAMINES, UR SCREEN: POSITIVE — AB
Amphetamines, Ur Screen: POSITIVE — AB
BENZODIAZEPINE, UR SCRN: POSITIVE — AB
BENZODIAZEPINE, UR SCRN: POSITIVE — AB
Cannabinoid 50 Ng, Ur ~~LOC~~: NOT DETECTED
Cannabinoid 50 Ng, Ur ~~LOC~~: NOT DETECTED
Cocaine Metabolite,Ur ~~LOC~~: NOT DETECTED
Cocaine Metabolite,Ur ~~LOC~~: NOT DETECTED
MDMA (ECSTASY) UR SCREEN: NOT DETECTED
MDMA (Ecstasy)Ur Screen: NOT DETECTED
METHADONE SCREEN, URINE: NOT DETECTED
METHADONE SCREEN, URINE: NOT DETECTED
Opiate, Ur Screen: NOT DETECTED
Opiate, Ur Screen: NOT DETECTED
Phencyclidine (PCP) Ur S: NOT DETECTED
Phencyclidine (PCP) Ur S: NOT DETECTED
TRICYCLIC, UR SCREEN: NOT DETECTED
TRICYCLIC, UR SCREEN: NOT DETECTED

## 2017-11-14 LAB — ACETAMINOPHEN LEVEL
Acetaminophen (Tylenol), Serum: <10 ug/mL — ABNORMAL LOW (ref 10–30)
Acetaminophen (Tylenol), Serum: <10 ug/mL — ABNORMAL LOW (ref 10–30)

## 2017-11-14 LAB — SALICYLATE LEVEL: Salicylate Lvl: <7 mg/dL (ref 2.8–30.0)

## 2017-11-14 LAB — ETHANOL: Alcohol, Ethyl (B): <10 mg/dL (ref <10)

## 2017-11-14 LAB — T4, FREE: FREE T4: 1.31 ng/dL (ref 0.82–1.77)

## 2017-11-14 LAB — TROPONIN I: Troponin I: <0.03 ng/mL (ref <0.03)

## 2017-11-14 LAB — TSH: TSH: 7.932 u[IU]/mL — AB (ref 0.350–4.500)

## 2017-11-14 LAB — CK: CK TOTAL: 221 U/L (ref 49–397)

## 2017-11-14 MED ORDER — LORAZEPAM 2 MG/ML IJ SOLN
1.0000 mg | Freq: Once | INTRAMUSCULAR | Status: AC
  Administered 2017-11-14: 1 mg via INTRAVENOUS
  Filled 2017-11-14: qty 1

## 2017-11-14 MED ORDER — LORAZEPAM 1 MG PO TABS: 1.0000 mg | ORAL_TABLET | Freq: Four times a day (QID) | ORAL | Status: DC | PRN

## 2017-11-14 MED ORDER — OLANZAPINE 5 MG PO TABS: 5.0000 mg | ORAL_TABLET | Freq: Four times a day (QID) | ORAL | Status: DC | PRN

## 2017-11-14 MED ORDER — SODIUM CHLORIDE 0.9 % IV BOLUS
1000.0000 mL | Freq: Once | INTRAVENOUS | Status: AC
  Administered 2017-11-14: 1000 mL via INTRAVENOUS

## 2017-11-14 NOTE — ED Notes (Signed)
Dr. Pershing ProudSchaevitz in triage room to speak to patient.

## 2017-11-14 NOTE — ED Notes (Signed)
Pt stating he does not know why he was brought here. Oriented to unit and unit rules. [facility]. Pt states "I need to get out of here". Asking to speak with the supervisor, advised supervisor cannot let him out of here. Pacing restlessly in room and dayroom. Came to nursing station several times. Pt currently whispering and stating the pt in the room next to him does not want him here. Appears to be responding to internal stimuli but currently denies hearing any voices. Denies SI/HI. Pt states he has to go home to take care of his elderly grandma.

## 2017-11-14 NOTE — ED Notes (Signed)
PT IVC/ Pending Placement/ Referrals faxed

## 2017-11-14 NOTE — Discharge Instructions (Addendum)
1.  Avoid caffeine and energy drinks. 2.  Do not use illicit drugs. 3.  Drink plenty of fluids and stay indoors this weekend. 4.  Return to the ER for worsening symptoms, persistent vomiting, difficulty breathing or other concerns.
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
## 2017-11-14 NOTE — ED Provider Notes (Signed)
[facility] Emergency Department Provider Note ____________________________________________   None    (approximate)  I have reviewed the triage vital signs and the nursing notes.   HISTORY  Chief Complaint Hallucinations  HPI Nathan Koch is a 27 y.o. male was depression was presenting to the emergency department today after being found wandering in the lobby.  Nursing upfront in triage states that the patient stated that he was looking for his girlfriend who was in the bathroom in the hospital.  However, there is nobody in the bathroom.  Patient was noted to be very disheveled with a foul odor.  Was then found to be wandering throughout the hospital floors.  Not reporting any palpitations or chest pain at this time.  Says that he does not take any psychiatric medications because they "messed up my mood."  Denies any drinking or drug use.  Past Medical History:  Diagnosis Date  . Anxiety   . Asthma   . Depression     Patient Active Problem List   Diagnosis Date Noted  . Asthma 06/26/2014    History reviewed. No pertinent surgical history.  Prior to Admission medications   Medication Sig Start Date End Date Taking? Authorizing Provider  albuterol (PROVENTIL HFA;VENTOLIN HFA) 108 (90 Base) MCG/ACT inhaler Inhale 2 puffs into the lungs every 4 (four) hours as needed for wheezing or shortness of breath. 10/10/17   Triplett, Rulon Eisenmenger B, FNP  cyclobenzaprine (FLEXERIL) 5 MG tablet Take 1 tablet (5 mg total) by mouth 3 (three) times daily as needed for muscle spasms. 10/29/17   Menshew, Charlesetta Ivory, PA-C  Fluticasone-Salmeterol (ADVAIR DISKUS) 250-50 MCG/DOSE AEPB Inhale 1 puff into the lungs 2 (two) times daily. 10/10/17   Chinita Pester, FNP    Allergies Patient has no known allergies.  Family History  Problem Relation Age of Onset  . Asthma Mother   . Asthma Brother     Social History Social History   Tobacco Use  . Smoking status: Former  Smoker    Last attempt to quit: 03/05/2016    Years since quitting: 1.6  . Smokeless tobacco: Never Used  Substance Use Topics  . Alcohol use: Yes    Alcohol/week: 4.2 oz    Types: 7 Standard drinks or equivalent per week  . Drug use: No    Review of Systems  Constitutional: No fever/chills Eyes: No visual changes. ENT: No sore throat. Cardiovascular: Denies chest pain. Respiratory: Denies shortness of breath. Gastrointestinal: No abdominal pain.  No nausea, no vomiting.  No diarrhea.  No constipation. Genitourinary: Negative for dysuria. Musculoskeletal: Negative for back pain. Skin: Negative for rash. Neurological: Negative for headaches, focal weakness or numbness.   ____________________________________________   PHYSICAL EXAM:  VITAL SIGNS: ED Triage Vitals [11/14/17 0856]  Enc Vitals Group     BP 116/75     Pulse Rate 96     Resp 18     Temp 97.8 F (36.6 C)     Temp Source Oral     SpO2 97 %     Weight      Height      Head Circumference      Peak Flow      Pain Score      Pain Loc      Pain Edu?      Excl. in GC?     Constitutional: Alert and oriented.  Patient with pressured speech.  Disheveled with feet covered in dirt.  Strong  smell of body odor. Eyes: Conjunctivae are normal.  Head: Atraumatic. Nose: No congestion/rhinnorhea. Mouth/Throat: Mucous membranes are moist.  Neck: No stridor.   Cardiovascular: Normal rate, regular rhythm. Grossly normal heart sounds.   Respiratory: Normal respiratory effort.  No retractions. Lungs CTAB. Gastrointestinal: Soft and nontender. No distention.  Musculoskeletal: No lower extremity tenderness nor edema.  No joint effusions. Neurologic:  Normal speech and language. No gross focal neurologic deficits are appreciated. Skin:  Skin is warm, dry and intact. No rash noted. Psychiatric: Pressured speech.  ____________________________________________   LABS (all labs ordered are listed, but only abnormal results  are displayed)  Labs Reviewed  COMPREHENSIVE METABOLIC PANEL  ETHANOL  CBC  URINE DRUG SCREEN, QUALITATIVE ([facility] ONLY)   ____________________________________________  EKG   ____________________________________________  RADIOLOGY   ____________________________________________   PROCEDURES  Procedure(s) performed:   Procedures  Critical Care performed:   ____________________________________________   INITIAL IMPRESSION / ASSESSMENT AND PLAN / ED COURSE  Pertinent labs & imaging results that were available during my care of the patient were reviewed by me and considered in my medical decision making (see chart for details).  DDX: Psychosis, drug abuse, manic episode, depression with psychotic features, anxiety As part of my medical decision making, I reviewed the following data within the electronic MEDICAL RECORD NUMBER Notes from prior ED visits  I reviewed Dr. Ardine BjorkSung's note from last night which also reported that the patient had not slept in several days.  Appears to be a component of mania with his presentation.  He will be involuntarily committed.  He will be evaluated by psychiatry. ____________________________________________   FINAL CLINICAL IMPRESSION(S) / ED DIAGNOSES  Psychosis    NEW MEDICATIONS STARTED DURING THIS VISIT:  New Prescriptions   No medications on file     Note:  This document was prepared using Dragon voice recognition software and may include unintentional dictation errors.     Myrna BlazerSchaevitz, David Matthew, MD 11/14/17 [phone]
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
## 2017-11-14 NOTE — ED Notes (Signed)
Pt offered PRN Ativan or PRN Zyprexa after he was observed being restless. Also seems to be trying to listen to others conversations. Pt refused stating "I don't take pills".

## 2017-11-14 NOTE — ED Notes (Signed)
Per Dr. Pershing ProudSchaevitz repeat labs today.

## 2017-11-14 NOTE — ED Notes (Signed)
Pt to be moved to [facility]. Report called to Nicole Cellaorothy, Charity fundraiserN.
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
## 2017-11-14 NOTE — ED Notes (Signed)
Pt's aunt June LeapDebbie Woody came to visit. States pt has never done methamphetamine before but she was told by his friends that he was smoking meth yesterday and started hallucinating. She gave her contact number as [phone] and stated she can be called anytime with questions. She states pt has never had any psychiaty/counseling care before.

## 2017-11-14 NOTE — ED Notes (Signed)
Hourly rounding reveals patient in room. No complaints, stable, in no acute distress. Q15 minute rounds and monitoring via Security Cameras to continue. 
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
## 2017-11-14 NOTE — ED Notes (Signed)
Pt speaking with SOC.  Officer with patient in interview room.   Maintained on 15 minute checks and observation by security camera for safety.

## 2017-11-14 NOTE — ED Notes (Signed)
Report to include Situation, Background, Assessment, and Recommendations received from Dorothy RN. Patient alert and oriented, warm and dry, in no acute distress. Patient denies SI, HI, AVH and pain. Patient made aware of Q15 minute rounds and security cameras for their safety. Patient instructed to come to me with needs or concerns.  

## 2017-11-14 NOTE — ED Notes (Signed)
Pt speaking with TTS at this time.  

## 2017-11-14 NOTE — ED Notes (Signed)
Hourly rounding reveals patient sleeping in room. No complaints, stable, in no acute distress. Q15 minute rounds and monitoring via Security Cameras to continue. 

## 2017-11-14 NOTE — ED Notes (Signed)
Pt still stating that he is here to see his friend stacy that is sick.

## 2017-11-14 NOTE — ED Notes (Signed)
Referral information for Psychiatric Hospitalization faxed to;   [facility]. [facility] [phone]),   . [place] [phone])  . [place] [phone] or [phone]  . [facility] [phone]),   . [facility] [phone]),   . [facility] [phone])  . [place] [phone] or [phone]),

## 2017-11-14 NOTE — ED Notes (Addendum)
Pt calm and cooperative with RN.  Denies SI/HI and AVH.  Pt stated, "I was trying to keep up with my friend, like a good friend would, because she was out of it and I ended up in the mental ward."   Pt upset he has been referred to as "crazy."  Pt given drink and sandwich tray. Maintained on 15 minute checks and observation by security camera for safety.

## 2017-11-15 DIAGNOSIS — F1310 Sedative, hypnotic or anxiolytic abuse, uncomplicated: Secondary | ICD-10-CM | POA: Insufficient documentation
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
## 2017-11-15 NOTE — ED Notes (Signed)
Hourly rounding reveals patient sleeping in room. No complaints, stable, in no acute distress. Q15 minute rounds and monitoring via Security Cameras to continue. 

## 2017-11-15 NOTE — ED Notes (Signed)
EMTALA reviewed,  Pt refusing vital signs prior to leaving

## 2017-11-15 NOTE — ED Notes (Signed)
Pt refused shower at this time.

## 2017-11-15 NOTE — ED Provider Notes (Signed)
-----------------------------------------   3:21 AM on 11/15/2017 -----------------------------------------   Blood pressure 117/63, pulse 69, temperature (!) 97.5 F (36.4 C), temperature source Oral, resp. rate 18, SpO2 98 %.  The patient had no acute events since last update.  Calm and cooperative at this time.  Disposition is pending Psychiatry/Behavioral Medicine team recommendations.     Merrily Brittleifenbark, Joselin Crandell, MD 11/15/17 [phone]
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
## 2017-11-15 NOTE — BH Assessment (Signed)
Patient has been accepted to [facility].  Patient assigned to room none assigned Accepting physician is Dr. Malvin JohnsBrian Farah.  Call report to [phone].     ER Staff is aware of it:  [facility]  Dr. Darnelle CatalanMalinda, ER MD  Nicole Cellaorothy Patient's Nurse

## 2017-11-15 NOTE — ED Provider Notes (Signed)
-----------------------------------------   10:39 AM on 11/15/2017 -----------------------------------------   Blood pressure 125/63, pulse 81, temperature 98.4 F (36.9 C), temperature source Oral, resp. rate 18, SpO2 100 %.  The patient had no acute events since last update.  Calm and cooperative at this time.  Disposition is pending Psychiatry/Behavioral Medicine team recommendations.     Arnaldo NatalMalinda, Paul F, MD 11/15/17 1041
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
## 2018-07-02 ENCOUNTER — Emergency Department: Payer: Self-pay | Admitting: Certified Registered"

## 2018-07-02 ENCOUNTER — Emergency Department
Admission: EM | Admit: 2018-07-02 | Discharge: 2018-07-02 | Disposition: A | Discharge status: Still In Hospital | Payer: Self-pay | Attending: Otolaryngology | Admitting: Otolaryngology

## 2018-07-02 ENCOUNTER — Other Ambulatory Visit: Payer: Self-pay

## 2018-07-02 ENCOUNTER — Encounter
Admission: EM | Disposition: A | Discharge status: Still In Hospital | Payer: Self-pay | Source: Home / Self Care | Attending: Emergency Medicine

## 2018-07-02 ENCOUNTER — Emergency Department: Payer: Self-pay

## 2018-07-02 ENCOUNTER — Encounter: Payer: Self-pay | Admitting: Emergency Medicine

## 2018-07-02 DIAGNOSIS — K029 Dental caries, unspecified: Secondary | ICD-10-CM | POA: Insufficient documentation

## 2018-07-02 DIAGNOSIS — J45909 Unspecified asthma, uncomplicated: Secondary | ICD-10-CM | POA: Insufficient documentation

## 2018-07-02 DIAGNOSIS — Z825 Family history of asthma and other chronic lower respiratory diseases: Secondary | ICD-10-CM | POA: Insufficient documentation

## 2018-07-02 DIAGNOSIS — Z87891 Personal history of nicotine dependence: Secondary | ICD-10-CM | POA: Insufficient documentation

## 2018-07-02 DIAGNOSIS — F329 Major depressive disorder, single episode, unspecified: Secondary | ICD-10-CM | POA: Insufficient documentation

## 2018-07-02 DIAGNOSIS — F419 Anxiety disorder, unspecified: Secondary | ICD-10-CM | POA: Insufficient documentation

## 2018-07-02 DIAGNOSIS — R6884 Jaw pain: Secondary | ICD-10-CM

## 2018-07-02 DIAGNOSIS — K047 Periapical abscess without sinus: Secondary | ICD-10-CM | POA: Insufficient documentation

## 2018-07-02 DIAGNOSIS — S0300XA Dislocation of jaw, unspecified side, initial encounter: Secondary | ICD-10-CM

## 2018-07-02 DIAGNOSIS — S0303XA Dislocation of jaw, bilateral, initial encounter: Secondary | ICD-10-CM | POA: Insufficient documentation

## 2018-07-02 DIAGNOSIS — Z79899 Other long term (current) drug therapy: Secondary | ICD-10-CM | POA: Insufficient documentation

## 2018-07-02 HISTORY — PX: CLOSED REDUCTION MANDIBLE: SHX5307

## 2018-07-02 SURGERY — CLOSED REDUCTION, MANDIBLE
Anesthesia: General | Site: Mouth

## 2018-07-02 MED ORDER — FENTANYL CITRATE (PF) 100 MCG/2ML IJ SOLN: 25.0000 ug | INTRAMUSCULAR | Status: DC | PRN

## 2018-07-02 MED ORDER — SUCCINYLCHOLINE CHLORIDE 20 MG/ML IJ SOLN
INTRAMUSCULAR | Status: DC | PRN
  Administered 2018-07-02 (×2): 100 mg via INTRAVENOUS

## 2018-07-02 MED ORDER — LIDOCAINE HCL (CARDIAC) PF 100 MG/5ML IV SOSY
PREFILLED_SYRINGE | INTRAVENOUS | Status: DC | PRN
  Administered 2018-07-02: 100 mg via INTRAVENOUS

## 2018-07-02 MED ORDER — PROPOFOL 10 MG/ML IV BOLUS
INTRAVENOUS | Status: AC | PRN
  Administered 2018-07-02 (×2): 70 mg via INTRAVENOUS

## 2018-07-02 MED ORDER — LACTATED RINGERS IV SOLN
INTRAVENOUS | Status: DC | PRN
  Administered 2018-07-02: 22:00:00 via INTRAVENOUS

## 2018-07-02 MED ORDER — SODIUM CHLORIDE FLUSH 0.9 % IV SOLN
INTRAVENOUS | Status: AC
  Filled 2018-07-02: qty 10

## 2018-07-02 MED ORDER — ONDANSETRON HCL 4 MG/2ML IJ SOLN: 4.0000 mg | Freq: Once | INTRAMUSCULAR | Status: DC | PRN

## 2018-07-02 MED ORDER — HYDROMORPHONE HCL 1 MG/ML IJ SOLN
1.0000 mg | INTRAMUSCULAR | Status: AC
  Administered 2018-07-02: 1 mg via INTRAVENOUS
  Filled 2018-07-02: qty 1

## 2018-07-02 MED ORDER — PROPOFOL 10 MG/ML IV BOLUS
INTRAVENOUS | Status: DC | PRN
  Administered 2018-07-02: 200 mg via INTRAVENOUS

## 2018-07-02 MED ORDER — EPHEDRINE SULFATE 50 MG/ML IJ SOLN
INTRAMUSCULAR | Status: DC | PRN
  Administered 2018-07-02: 15 mg via INTRAVENOUS

## 2018-07-02 MED ORDER — PROPOFOL 10 MG/ML IV BOLUS
2.0000 mg/kg | Freq: Once | INTRAVENOUS | Status: AC
  Administered 2018-07-02: 147.8 mg via INTRAVENOUS
  Filled 2018-07-02: qty 20

## 2018-07-02 MED ORDER — MIDAZOLAM HCL 2 MG/2ML IJ SOLN
INTRAMUSCULAR | Status: DC | PRN
  Administered 2018-07-02: 2 mg via INTRAVENOUS

## 2018-07-02 MED ORDER — MIDAZOLAM HCL 2 MG/2ML IJ SOLN
INTRAMUSCULAR | Status: AC
  Filled 2018-07-02: qty 2

## 2018-07-02 NOTE — Anesthesia Preprocedure Evaluation (Signed)
Anesthesia Evaluation  Patient identified by MRN, date of birth, ID band Patient awake    Reviewed: Allergy & Precautions, H&P , NPO status , Patient's Chart, lab work & pertinent test results, reviewed documented beta blocker date and time   Airway Mallampati: II  TM Distance: >3 FB Neck ROM: full    Dental  (+) Teeth Intact   Pulmonary asthma , former smoker,    Pulmonary exam normal        Cardiovascular Exercise Tolerance: Good negative cardio ROS Normal cardiovascular exam Rhythm:regular Rate:Normal     Neuro/Psych PSYCHIATRIC DISORDERS Anxiety Depression negative neurological ROS     GI/Hepatic negative GI ROS, Neg liver ROS,   Endo/Other  negative endocrine ROS  Renal/GU negative Renal ROS  negative genitourinary   Musculoskeletal   Abdominal   Peds  Hematology negative hematology ROS (+)   Anesthesia Other Findings Past Medical History: No date: Anxiety No date: Asthma No date: Depression History reviewed. No pertinent surgical history. BMI    Body Mass Index:  24.78 kg/m     Reproductive/Obstetrics negative OB ROS                             Anesthesia Physical Anesthesia Plan  ASA: II and emergent  Anesthesia Plan: General ETT   Post-op Pain Management:    Induction:   PONV Risk Score and Plan:   Airway Management Planned:   Additional Equipment:   Intra-op Plan:   Post-operative Plan:   Informed Consent: I have reviewed the patients History and Physical, chart, labs and discussed the procedure including the risks, benefits and alternatives for the proposed anesthesia with the patient or authorized representative who has indicated his/her understanding and acceptance.     Dental Advisory Given  Plan Discussed with: CRNA  Anesthesia Plan Comments:         Anesthesia Quick Evaluation
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
## 2018-07-02 NOTE — Transfer of Care (Signed)
Immediate Anesthesia Transfer of Care Note  Patient: Nathan Koch  Procedure(s) Performed: CLOSED REDUCTION MANDIBULAR (N/A Mouth)  Patient Location: PACU  Anesthesia Type:General  Level of Consciousness: awake, alert  and oriented  Airway & Oxygen Therapy: Patient Spontanous Breathing and Patient connected to nasal cannula oxygen  Post-op Assessment: Report given to RN and Post -op Vital signs reviewed and stable  Post vital signs: Reviewed and stable  Last Vitals:  Vitals Value Taken Time  BP    Temp    Pulse    Resp    SpO2      Last Pain:  Vitals:   07/02/18 2124  TempSrc: Temporal  PainSc:          Complications: No apparent anesthesia complications

## 2018-07-02 NOTE — Anesthesia Procedure Notes (Signed)
Procedure Name: Intubation Date/Time: 07/02/2018 9:58 PM Performed by: Estanislado Emms, CRNA Pre-anesthesia Checklist: Patient identified, Patient being monitored, Timeout performed, Emergency Drugs available and Suction available Patient Re-evaluated:Patient Re-evaluated prior to induction Oxygen Delivery Method: Circle system utilized Preoxygenation: Pre-oxygenation with 100% oxygen Induction Type: IV induction Ventilation: Mask ventilation without difficulty Laryngoscope Size: Miller and 2 Grade View: Grade I Tube type: Oral Tube size: 7.0 mm Number of attempts: 1 Airway Equipment and Method: Stylet Placement Confirmation: ETT inserted through vocal cords under direct vision,  positive ETCO2 and breath sounds checked- equal and bilateral Secured at: 21 cm Tube secured with: Tape Dental Injury: Teeth and Oropharynx as per pre-operative assessment
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
## 2018-07-02 NOTE — Discharge Instructions (Signed)
° °  The patient will be careful not to open his mouth wide for the next several days.  He will start with relatively soft foods.  He will get an appointment with oral surgeon to have his TMJs evaluated to make sure the doing well.  AMBULATORY SURGERY  DISCHARGE INSTRUCTIONS   1) The drugs that you were given will stay in your system until tomorrow so for the next 24 hours you should not:  A) Drive an automobile B) Make any legal decisions C) Drink any alcoholic beverage   2) You may resume regular meals tomorrow.  Today it is better to start with liquids and gradually work up to solid foods.  You may eat anything you prefer, but it is better to start with liquids, then soup and crackers, and gradually work up to solid foods.   3) Please notify your doctor immediately if you have any unusual bleeding, trouble breathing, redness and pain at the surgery site, drainage, fever, or pain not relieved by medication.    4) Additional Instructions:        Please contact your physician with any problems or Same Day Surgery at [phone], Monday through Friday 6 am to 4 pm, or [place] at [facility] number at [phone].

## 2018-07-02 NOTE — Op Note (Addendum)
07/02/2018  10:07 PM    Haze Justin  [id number]   Pre-Op Dx: Bilateral dislocated mandible  Post-op Dx: Bilateral dislocated mandible  Proc: Manipulation of the jaw to relocate the mandible to its normal position under general anesthesia  Surg:  Cammy Copa  Anes:  GOT  EBL: None  Comp: None  Findings: Both condyles anteriorly displaced, noted on CT scan  Procedure: The patient was brought to the operating room placed in supine position.  He was given general anesthesia by oral endotracheal intubation.  He was given muscle relaxants to make sure his muscles were not spasming.  I placed some 4 x 4's on both sides of his lower mandible and with my thumbs on his teeth I was able to inferiorly displace and then posteriorly moved the mandible to put it back into its normal position.  He can now close his jaw completely.  He had no swelling or bruising at his TMJ area.  Patient was then awakened and taken to the recovery room in satisfactory condition.  There were no operative complications.  Dispo:   To PACU to be discharged home  Plan: The patient will be careful not to open his mouth wide for the next several days.  He will start with relatively soft foods.  He will get an appointment with oral surgeon to have his TMJs evaluated to make sure the doing well.  He does not need to return to my office as there is nothing further for me to do.  No prescriptions are necessary.  Beverly Sessions Prescious Hurless  07/02/2018 10:07 PM
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
## 2018-07-02 NOTE — Anesthesia Post-op Follow-up Note (Signed)
Anesthesia QCDR form completed.        

## 2018-07-03 ENCOUNTER — Encounter: Payer: Self-pay | Admitting: Otolaryngology

## 2018-07-03 NOTE — Anesthesia Postprocedure Evaluation (Signed)
Anesthesia Post Note  Patient: Nathan Koch  Procedure(s) Performed: CLOSED REDUCTION MANDIBULAR (N/A Mouth)  Patient location during evaluation: PACU Anesthesia Type: General Level of consciousness: awake and alert Pain management: pain level controlled Vital Signs Assessment: post-procedure vital signs reviewed and stable Respiratory status: spontaneous breathing, nonlabored ventilation, respiratory function stable and patient connected to nasal cannula oxygen Cardiovascular status: blood pressure returned to baseline and stable Postop Assessment: no apparent nausea or vomiting Anesthetic complications: no     Last Vitals:  Vitals:   07/02/18 2246 07/02/18 2314  BP: 103/63 121/67  Pulse: (!) 57 69  Resp: 15 16  Temp: 36.7 C 36.7 C  SpO2: 100% 100%    Last Pain:  Vitals:   07/02/18 2314  TempSrc: Temporal  PainSc: 0-No pain                 Yevette Edwards

## 2018-07-11 ENCOUNTER — Encounter: Payer: Self-pay | Admitting: Emergency Medicine

## 2018-07-11 ENCOUNTER — Emergency Department
Admission: EM | Admit: 2018-07-11 | Discharge: 2018-07-11 | Disposition: A | Discharge status: Home / Self Care | Payer: Self-pay | Attending: Emergency Medicine | Admitting: Emergency Medicine

## 2018-07-11 ENCOUNTER — Other Ambulatory Visit: Payer: Self-pay

## 2018-07-11 DIAGNOSIS — F419 Anxiety disorder, unspecified: Secondary | ICD-10-CM | POA: Insufficient documentation

## 2018-07-11 DIAGNOSIS — Z79899 Other long term (current) drug therapy: Secondary | ICD-10-CM | POA: Insufficient documentation

## 2018-07-11 DIAGNOSIS — J45909 Unspecified asthma, uncomplicated: Secondary | ICD-10-CM | POA: Insufficient documentation

## 2018-07-11 DIAGNOSIS — F17210 Nicotine dependence, cigarettes, uncomplicated: Secondary | ICD-10-CM | POA: Insufficient documentation

## 2018-07-11 DIAGNOSIS — F3181 Bipolar II disorder: Secondary | ICD-10-CM

## 2018-07-11 DIAGNOSIS — F1510 Other stimulant abuse, uncomplicated: Secondary | ICD-10-CM

## 2018-07-11 LAB — COMPREHENSIVE METABOLIC PANEL
ALT: 15 U/L (ref 0–44)
ANION GAP: 11 (ref 5–15)
AST: 23 U/L (ref 15–41)
Albumin: 4.2 g/dL (ref 3.5–5.0)
Alkaline Phosphatase: 62 U/L (ref 38–126)
BUN: 13 mg/dL (ref 6–20)
CO2: 25 mmol/L (ref 22–32)
Calcium: 9.3 mg/dL (ref 8.9–10.3)
Chloride: 102 mmol/L (ref 98–111)
Creatinine, Ser: 0.78 mg/dL (ref 0.61–1.24)
GFR calc Af Amer: >60 mL/min (ref >60)
GFR calc non Af Amer: >60 mL/min (ref >60)
GLUCOSE: 99 mg/dL (ref 70–99)
Potassium: 3.9 mmol/L (ref 3.5–5.1)
Sodium: 138 mmol/L (ref 135–145)
Total Bilirubin: 1.2 mg/dL (ref 0.3–1.2)
Total Protein: 8.2 g/dL — ABNORMAL HIGH (ref 6.5–8.1)

## 2018-07-11 LAB — CBC
HCT: 44.3 % (ref 39.0–52.0)
Hemoglobin: 14.7 g/dL (ref 13.0–17.0)
MCH: 28.4 pg (ref 26.0–34.0)
MCHC: 33.2 g/dL (ref 30.0–36.0)
MCV: 85.5 fL (ref 80.0–100.0)
Platelets: 331 10*3/uL (ref 150–400)
RBC: 5.18 MIL/uL (ref 4.22–5.81)
RDW: 13.2 % (ref 11.5–15.5)
WBC: 7.9 10*3/uL (ref 4.0–10.5)
nRBC: 0 % (ref 0.0–0.2)

## 2018-07-11 LAB — URINE DRUG SCREEN, QUALITATIVE (ARMC ONLY)
Amphetamines, Ur Screen: POSITIVE — AB
BARBITURATES, UR SCREEN: NOT DETECTED
Benzodiazepine, Ur Scrn: NOT DETECTED
Cannabinoid 50 Ng, Ur ~~LOC~~: NOT DETECTED
Cocaine Metabolite,Ur ~~LOC~~: NOT DETECTED
MDMA (Ecstasy)Ur Screen: NOT DETECTED
Methadone Scn, Ur: NOT DETECTED
Opiate, Ur Screen: NOT DETECTED
Phencyclidine (PCP) Ur S: NOT DETECTED
Tricyclic, Ur Screen: NOT DETECTED

## 2018-07-11 LAB — ETHANOL: Alcohol, Ethyl (B): <10 mg/dL (ref <10)

## 2018-07-11 LAB — SALICYLATE LEVEL: Salicylate Lvl: <7 mg/dL (ref 2.8–30.0)

## 2018-07-11 LAB — ACETAMINOPHEN LEVEL

## 2018-07-11 NOTE — Discharge Instructions (Addendum)

## 2018-07-11 NOTE — BH Assessment (Signed)
Assessment Note  Nathan Koch is an 28 y.o. male.  with history of anxiety and depression but who denies any prior suicidal ideation.  He presents escorted by law enforcement for evaluation of "bad thoughts about hurting myself".  He does not really want to kill himself but he is deftly having thoughts of suicide but without a specific plan.  He admits to methamphetamine and tobacco use but denies any other drugs.  He said that the thoughts were fairly acute in onset today and severe but now he feels better than he did before.  He says he has been depressed for a long time Although he is unwilling to take medication or comply with outpatient treatment as he states " I don't need it."  Pt. denies any suicidal  plan or intent. Pt. denies the presence of any auditory or visual hallucinations at this time. Patient denies any other medical complaints.     Diagnosis: Depression  Past Medical History:  Past Medical History:  Diagnosis Date  . Anxiety   . Asthma   . Depression     Past Surgical History:  Procedure Laterality Date  . CLOSED REDUCTION MANDIBLE N/A 07/02/2018   Procedure: CLOSED REDUCTION MANDIBULAR;  Surgeon: Vernie Murders, MD;  Location: [facility] ORS;  Service: ENT;  Laterality: N/A;    Family History:  Family History  Problem Relation Age of Onset  . Asthma Mother   . Asthma Brother     Social History:  reports that he has been smoking cigarettes. He has been smoking about 1.00 pack per day. He has never used smokeless tobacco. He reports previous alcohol use of about 7.0 standard drinks of alcohol per week. He reports current drug use. Drug: Methamphetamines.  Additional Social History:  Alcohol / Drug Use Pain Medications: SEE MAR Prescriptions: SEE MAR Over the Counter: SEE MAR History of alcohol / drug use?: Yes Longest period of sobriety (when/how long): Unknown  Substance #1 Name of Substance 1: Meth  1 - Age of First Use: 26 1 - Amount (size/oz): unknown  1 -  Frequency: Varies  1 - Duration: ongoing  1 - Last Use / Amount: X4 months   CIWA: CIWA-Ar BP: 99/64 Pulse Rate: 71 COWS:    Allergies: No Known Allergies  Home Medications: (Not in a hospital admission)   OB/GYN Status:  No LMP for male patient.  General Assessment Data Location of Assessment: [facility] ED TTS Assessment: In system Is this a Tele or Face-to-Face Assessment?: Face-to-Face Is this an Initial Assessment or a Re-assessment for this encounter?: Initial Assessment Patient Accompanied by:: N/A Language Other than English: No Living Arrangements: Other (Comment) What gender do you identify as?: Male Marital status: Single Living Arrangements: Alone Can pt return to current living arrangement?: Yes Admission Status: Voluntary Is patient capable of signing voluntary admission?: Yes Referral Source: Self/Family/Friend Insurance type: none   Medical Screening Exam [facility] Walk-in ONLY) Medical Exam completed: Yes  Crisis Care Plan Living Arrangements: Alone Legal Guardian: Other: Name of Psychiatrist: none  Name of Therapist: none  Education Status Is patient currently in school?: No Is the patient employed, unemployed or receiving disability?: Receiving disability income  Risk to self with the past 6 months Suicidal Ideation: Yes-Currently Present Has patient been a risk to self within the past 6 months prior to admission? : Yes Suicidal Intent: No Has patient had any suicidal intent within the past 6 months prior to admission? : No Is patient at risk for suicide?: Yes  Suicidal Plan?: No Has patient had any suicidal plan within the past 6 months prior to admission? : No What has been your use of drugs/alcohol within the last 12 months?: Meht use  Previous Attempts/Gestures: Yes How many times?: 0 Other Self Harm Risks: drug use Intentional Self Injurious Behavior: None Family Suicide History: No Recent stressful life event(s): Conflict (Comment), Financial  Problems Persecutory voices/beliefs?: No Depression: Yes Depression Symptoms: Loss of interest in usual pleasures, Feeling angry/irritable, Isolating Substance abuse history and/or treatment for substance abuse?: Yes Suicide prevention information given to non-admitted patients: Yes  Risk to Others within the past 6 months Homicidal Ideation: No Does patient have any lifetime risk of violence toward others beyond the six months prior to admission? : No Thoughts of Harm to Others: No Current Homicidal Intent: No Current Homicidal Plan: No Access to Homicidal Means: No History of harm to others?: No Assessment of Violence: None Noted Does patient have access to weapons?: No Criminal Charges Pending?: No Does patient have a court date: No Is patient on probation?: No  Psychosis Hallucinations: None noted Delusions: None noted  Mental Status Report Appearance/Hygiene: Unremarkable Eye Contact: Poor Motor Activity: Freedom of movement Speech: Soft Level of Consciousness: Alert Mood: Depressed Affect: Sad, Depressed Anxiety Level: Minimal Thought Processes: Coherent Judgement: Partial Orientation: Time, Place, Person, Situation Obsessive Compulsive Thoughts/Behaviors: None  Cognitive Functioning Concentration: Good Memory: Remote Intact, Recent Intact Is patient IDD: No Insight: Fair Impulse Control: Fair Appetite: Fair Have you had any weight changes? : No Change Sleep: No Change Total Hours of Sleep: 6 Vegetative Symptoms: None  ADLScreening [facility] Services) Patient's cognitive ability adequate to safely complete daily activities?: Yes Patient able to express need for assistance with ADLs?: Yes Independently performs ADLs?: Yes (appropriate for developmental age)  Prior Inpatient Therapy Prior Inpatient Therapy: Yes Prior Therapy Dates: 10/2017 Prior Therapy Facilty/Provider(s): [facility] Reason for Treatment: Delisional Disorder  Prior Outpatient  Therapy Prior Outpatient Therapy: No Does patient have an ACCT team?: No Does patient have Intensive In-House Services?  : No Does patient have [facility] services? : No Does patient have P4CC services?: No  ADL Screening (condition at time of admission) Patient's cognitive ability adequate to safely complete daily activities?: Yes Patient able to express need for assistance with ADLs?: Yes Independently performs ADLs?: Yes (appropriate for developmental age)       Abuse/Neglect Assessment (Assessment to be complete while patient is alone) Abuse/Neglect Assessment Can Be Completed: Yes Physical Abuse: Denies Verbal Abuse: Denies Sexual Abuse: Denies Exploitation of patient/patient's resources: Denies Self-Neglect: Denies Values / Beliefs Cultural Requests During Hospitalization: None Spiritual Requests During Hospitalization: None Consults Spiritual Care Consult Needed: No Social Work Consult Needed: No Merchant navy officer (For Healthcare) Does Patient Have a Medical Advance Directive?: No Would patient like information on creating a medical advance directive?: No - Patient declined          Disposition:  Disposition Initial Assessment Completed for this Encounter: Yes Patient referred to: Other (Comment)(Consult with [facility])  On Site Evaluation by:   Reviewed with Physician:    Asa Saunas 07/11/2018 5:39 AM

## 2018-07-11 NOTE — ED Triage Notes (Signed)
Pt brought to the ED by [employer] officer Hamadani-pt says he was having bad thoughts about hurting himself but doesn't think he'd actually do it; pt calm and cooperative in triage;
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
## 2018-07-15 ENCOUNTER — Emergency Department: Admission: EM | Admit: 2018-07-15 | Discharge: 2018-07-15 | Discharge status: Against Medical Advice | Payer: Self-pay

## 2018-07-15 NOTE — ED Notes (Signed)
Pt arrives ambulatory via [facility] with abdominal pain. Pt reports that he was walking home from the [employer] and stumbled. Pt then stated that he had abdominal pain. Per EMS, VS [facility].

## 2018-07-18 ENCOUNTER — Emergency Department
Admission: EM | Admit: 2018-07-18 | Discharge: 2018-07-18 | Disposition: A | Discharge status: Home / Self Care | Payer: Self-pay | Attending: Emergency Medicine | Admitting: Emergency Medicine

## 2018-07-18 DIAGNOSIS — R112 Nausea with vomiting, unspecified: Secondary | ICD-10-CM | POA: Insufficient documentation

## 2018-07-18 DIAGNOSIS — R0981 Nasal congestion: Secondary | ICD-10-CM | POA: Insufficient documentation

## 2018-07-18 DIAGNOSIS — J45909 Unspecified asthma, uncomplicated: Secondary | ICD-10-CM | POA: Insufficient documentation

## 2018-07-18 DIAGNOSIS — F17210 Nicotine dependence, cigarettes, uncomplicated: Secondary | ICD-10-CM | POA: Insufficient documentation

## 2018-07-18 DIAGNOSIS — Z79899 Other long term (current) drug therapy: Secondary | ICD-10-CM | POA: Insufficient documentation

## 2018-07-18 LAB — COMPREHENSIVE METABOLIC PANEL
ALT: 13 U/L (ref 0–44)
AST: 18 U/L (ref 15–41)
Albumin: 3.8 g/dL (ref 3.5–5.0)
Alkaline Phosphatase: 52 U/L (ref 38–126)
Anion gap: 9 (ref 5–15)
BUN: 10 mg/dL (ref 6–20)
CHLORIDE: 107 mmol/L (ref 98–111)
CO2: 25 mmol/L (ref 22–32)
Calcium: 8.8 mg/dL — ABNORMAL LOW (ref 8.9–10.3)
Creatinine, Ser: 0.62 mg/dL (ref 0.61–1.24)
GFR calc Af Amer: >60 mL/min (ref >60)
GFR calc non Af Amer: >60 mL/min (ref >60)
Glucose, Bld: 94 mg/dL (ref 70–99)
Potassium: 3.5 mmol/L (ref 3.5–5.1)
Sodium: 141 mmol/L (ref 135–145)
Total Bilirubin: 0.4 mg/dL (ref 0.3–1.2)
Total Protein: 6.7 g/dL (ref 6.5–8.1)

## 2018-07-18 LAB — CBC
HCT: 42 % (ref 39.0–52.0)
Hemoglobin: 13.8 g/dL (ref 13.0–17.0)
MCH: 28.6 pg (ref 26.0–34.0)
MCHC: 32.9 g/dL (ref 30.0–36.0)
MCV: 87.1 fL (ref 80.0–100.0)
Platelets: 342 10*3/uL (ref 150–400)
RBC: 4.82 MIL/uL (ref 4.22–5.81)
RDW: 13.4 % (ref 11.5–15.5)
WBC: 11.4 10*3/uL — AB (ref 4.0–10.5)
nRBC: 0 % (ref 0.0–0.2)

## 2018-07-18 LAB — LIPASE, BLOOD: Lipase: 40 U/L (ref 11–51)

## 2018-07-18 MED ORDER — ONDANSETRON 4 MG PO TBDP: 4.0000 mg | ORAL_TABLET | Freq: Three times a day (TID) | ORAL | 0 refills | Status: DC | PRN

## 2018-07-18 MED ORDER — ONDANSETRON 4 MG PO TBDP
4.0000 mg | ORAL_TABLET | Freq: Once | ORAL | Status: AC
  Administered 2018-07-18: 4 mg via ORAL

## 2018-07-18 MED ORDER — ONDANSETRON 4 MG PO TBDP
ORAL_TABLET | ORAL | Status: AC
  Administered 2018-07-18: 4 mg via ORAL
  Filled 2018-07-18: qty 1

## 2018-07-18 NOTE — ED Notes (Signed)
Patient given water for PO challenge. RN will continue to monitor.

## 2018-07-18 NOTE — ED Notes (Signed)
Patient able to tolerate fluids with no issue. MD ordered to discharge patient.

## 2018-07-18 NOTE — Discharge Instructions (Addendum)
Use the Zofran melt on your tongue wafers 1 3 times a day as needed for nausea.  Return here for continued vomiting or pain or fever.  Please follow-up with your regular doctor or see the health department or the [facility] clinic or the [facility] or the [facility] or the [facility] clinic for follow-up.

## 2018-07-18 NOTE — ED Notes (Signed)
Reviewed discharge instructions, follow-up care, and prescriptions with patient. Patient verbalized understanding of all information reviewed. Patient stable, with no distress noted at this time.    

## 2018-07-18 NOTE — ED Provider Notes (Signed)
[facility] Emergency Department Provider Note  \ ____________________________________________   First MD Initiated Contact with Patient 07/18/18 [phone]     (approximate)  I have reviewed the triage vital signs and the nursing notes.   HISTORY  Chief Complaint Emesis and Nasal Congestion    HPI Nathan Koch is a 28 y.o. male patient reports he had left his girlfriend's house his nose began running and then he got nauseated and felt like he was going to vomit.  He did not have any belly pain he did not vomit as I understand.  He is not having any diarrhea.  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         Past Medical History:  Diagnosis Date  . Anxiety   . Asthma   . Depression     Patient Active Problem List   Diagnosis Date Noted  . Asthma 06/26/2014    Past Surgical History:  Procedure Laterality Date  . CLOSED REDUCTION MANDIBLE N/A 07/02/2018   Procedure: CLOSED REDUCTION MANDIBULAR;  Surgeon: Vernie Murders, MD;  Location: [facility] ORS;  Service: ENT;  Laterality: N/A;    Prior to Admission medications   Medication Sig Start Date End Date Taking? Authorizing Provider  albuterol (PROVENTIL HFA;VENTOLIN HFA) 108 (90 Base) MCG/ACT inhaler Inhale 2 puffs into the lungs every 4 (four) hours as needed for wheezing or shortness of breath. 10/10/17   Triplett, Cari B, FNP  Fluticasone-Salmeterol (ADVAIR DISKUS) 250-50 MCG/DOSE AEPB Inhale 1 puff into the lungs 2 (two) times daily. 10/10/17   Triplett, Rulon Eisenmenger B, FNP  ondansetron (ZOFRAN ODT) 4 MG disintegrating tablet Take 1 tablet (4 mg total) by mouth every 8 (eight) hours as needed for nausea or vomiting. 07/18/18   Arnaldo Natal, MD    Allergies Ibuprofen  Family History  Problem Relation Age of Onset  . Asthma Mother   . Asthma Brother     Social History Social History   Tobacco Use  . Smoking status: Current Every Day Smoker   Packs/day: 1.00    Types: Cigarettes  . Smokeless tobacco: Never Used  . Tobacco comment: started smoking again December 2019  Substance Use Topics  . Alcohol use: Not Currently    Alcohol/week: 7.0 standard drinks    Types: 7 Standard drinks or equivalent per week  . Drug use: Yes    Types: Methamphetamines    Comment: last used about 4 weeks ago    Review of Systems  Constitutional: No fever/chills Eyes: No visual changes. ENT: No sore throat. Cardiovascular: Denies chest pain. Respiratory: Denies shortness of breath. Gastrointestinal: No abdominal pain.   nausea, no vomiting.  No diarrhea.  No constipation. Genitourinary: Negative for dysuria. Musculoskeletal: Negative for back pain. Skin: Negative for rash. Neurological: Negative for headaches, focal weakness   ____________________________________________   PHYSICAL EXAM:  VITAL SIGNS: ED Triage Vitals [07/18/18 0034]  Enc Vitals Group     BP 122/63     Pulse Rate (!) 56     Resp 17     Temp (!) 97.5 F (36.4 C)     Temp Source Oral     SpO2 99 %     Weight 140 lb (63.5 kg)     Height 5\' 8"  (1.727 m)     Head Circumference      Peak Flow      Pain Score 8     Pain Loc  Pain Edu?      Excl. in GC?     Constitutional: Alert and oriented. Well appearing and in no acute distress. Eyes: Conjunctivae are normal. Head: Atraumatic. Nose: No congestion/rhinnorhea. Mouth/Throat: Mucous membranes are moist.  Oropharynx non-erythematous. Neck: No stridor.  Cardiovascular: Normal rate, regular rhythm. Grossly normal heart sounds.  Good peripheral circulation. Respiratory: Normal respiratory effort.  No retractions. Lungs CTAB. Gastrointestinal: Soft and nontender. No distention. No abdominal bruits. No CVA tenderness. Musculoskeletal: No lower extremity tenderness nor edema.  Neurologic:  Normal speech and language. No gross focal neurologic deficits are appreciated. No gait instability. Skin:  Skin is warm,  dry and intact. No rash noted.   ____________________________________________   LABS (all labs ordered are listed, but only abnormal results are displayed)  Labs Reviewed  CBC - Abnormal; Notable for the following components:      Result Value   WBC 11.4 (*)    All other components within normal limits  COMPREHENSIVE METABOLIC PANEL - Abnormal; Notable for the following components:   Calcium 8.8 (*)    All other components within normal limits  LIPASE, BLOOD  URINALYSIS, COMPLETE (UACMP) WITH MICROSCOPIC   ____________________________________________  EKG   ____________________________________________  RADIOLOGY  ED MD interpretation:   Official radiology report(s): No results found.  ____________________________________________   PROCEDURES  Procedure(s) performed (including Critical Care):  Procedures   ____________________________________________   INITIAL IMPRESSION / ASSESSMENT AND PLAN / ED COURSE  Patient has had no emesis here in the hospital.  We will give him some Zofran let him go home.  Everything looks pretty normal here.             ____________________________________________   FINAL CLINICAL IMPRESSION(S) / ED DIAGNOSES  Final diagnoses:  Non-intractable vomiting with nausea, unspecified vomiting type  Nasal congestion     ED Discharge Orders         Ordered    ondansetron (ZOFRAN ODT) 4 MG disintegrating tablet  Every 8 hours PRN     07/18/18 0549           Note:  This document was prepared using Dragon voice recognition software and may include unintentional dictation errors.    Arnaldo Natal, MD 07/18/18 [phone]

## 2018-07-18 NOTE — ED Triage Notes (Signed)
Patient reports he was walking down the street and had a sudden onset of N/V. Patient also c/o sinus congestion and mid abdominal pain.

## 2018-07-18 NOTE — ED Notes (Signed)
ED Provider at bedside. 

## 2018-07-18 NOTE — ED Notes (Signed)
This RN made 2 unsuccessful attempts at PIV insertion. Erie Noe RN reports she will go to bedside to attempt PIV insertion.

## 2018-08-18 ENCOUNTER — Other Ambulatory Visit: Payer: Self-pay

## 2018-08-18 ENCOUNTER — Emergency Department
Admission: EM | Admit: 2018-08-18 | Discharge: 2018-08-18 | Disposition: A | Discharge status: Home / Self Care | Payer: Self-pay | Attending: Student in an Organized Health Care Education/Training Program | Admitting: Student in an Organized Health Care Education/Training Program

## 2018-08-18 ENCOUNTER — Encounter: Payer: Self-pay | Admitting: Emergency Medicine

## 2018-08-18 DIAGNOSIS — K047 Periapical abscess without sinus: Secondary | ICD-10-CM | POA: Insufficient documentation

## 2018-08-18 DIAGNOSIS — M26609 Unspecified temporomandibular joint disorder, unspecified side: Secondary | ICD-10-CM | POA: Insufficient documentation

## 2018-08-18 MED ORDER — CYCLOBENZAPRINE HCL 10 MG PO TABS
10.0000 mg | ORAL_TABLET | Freq: Once | ORAL | Status: AC
  Administered 2018-08-18: 10 mg via ORAL
  Filled 2018-08-18: qty 1

## 2018-08-18 MED ORDER — AMOXICILLIN 500 MG PO CAPS: 500.0000 mg | ORAL_CAPSULE | Freq: Three times a day (TID) | ORAL | 0 refills | Status: AC

## 2018-08-18 MED ORDER — AMOXICILLIN 500 MG PO CAPS
500.0000 mg | ORAL_CAPSULE | Freq: Once | ORAL | Status: AC
  Administered 2018-08-18: 500 mg via ORAL
  Filled 2018-08-18: qty 1

## 2018-08-18 MED ORDER — CYCLOBENZAPRINE HCL 5 MG PO TABS: 5.0000 mg | ORAL_TABLET | Freq: Three times a day (TID) | ORAL | 0 refills | Status: DC | PRN

## 2018-08-18 NOTE — Discharge Instructions (Addendum)
Take the antibiotic as directed. Rinse daily with warm salty water. Follow-up with a local dental provider for routine dental care and dental extractions.  OPTIONS FOR DENTAL FOLLOW UP CARE  [place] Department of Health and Human Services - Local Safety Net Dental Clinics TripDoors.comhttp://www.ncdhhs.gov/dph/oralhealth/services/safetynetclinics.htm   [facility] [phone])  [facility] [phone])  [facility] [phone]  [facility] [phone])  [facility] [phone]) This clinic caters to the indigent population and is on a lottery system. Location: [employer] [facility] [employer] [facility], [address], [place] [facility] Hours: Wednesdays from 6pm - 9pm, patients seen by a lottery system. For dates, call or go to [email or web address] Services: Cleanings, fillings and simple extractions. Payment Options: DENTAL WORK IS FREE OF CHARGE. Bring proof of income or support. Best way to get seen: Arrive at 5:15 pm - this is a lottery, NOT first come/first serve, so arriving earlier will not increase your chances of being seen.     [facility] [phone] Select option 1 for emergencies   Location: [facility], [facility], [address], [place] [facility] Hours: No walk-ins accepted - call the day before to schedule an appointment. Check in times are 9:30 am and 1:30 pm. Services: Simple extractions, temporary fillings, pulpectomy/pulp debridement, uncomplicated abscess drainage. Payment Options: PAYMENT IS DUE AT THE TIME OF SERVICE.  Fee is usually $100-200, additional surgical procedures (e.g. abscess drainage) may be extra. Cash, checks, Visa/MasterCard accepted.  Can file Medicaid if patient is covered for dental - patient should call case worker to check. No discount for [facility] patients. Best way to get seen: MUST call the  day before and get onto the schedule. Can usually be seen the next 1-2 days. No walk-ins accepted.     [facility] [phone]   Location: [facility], [address], [place] [facility] Hours: M, W, Th, F 8am or 1:30pm, Tues 9a or 1:30 - first come/first served. Services: Simple extractions, temporary fillings, uncomplicated abscess drainage.  You do not need to be an [facility] [place] [facility] [place] resident. Payment Options: PAYMENT IS DUE AT THE TIME OF SERVICE. Dental insurance, otherwise sliding scale - bring proof of income or support. Depending on income and treatment needed, cost is usually $50-200. Best way to get seen: Arrive early as it is first come/first served.     [facility] [phone]   Location: [address] Clinic Hours: Mon-Thu 8a-5p Services: Most basic dental services including extractions and fillings. Payment Options: PAYMENT IS DUE AT THE TIME OF SERVICE. Sliding scale, up to 50% off - bring proof if income or support. Medicaid with dental option accepted. Best way to get seen: Call to schedule an appointment, can usually be seen within 2 weeks OR they will try to see walk-ins - show up at 8a or 2p (you may have to wait).     [facility] [phone] [place] RESIDENTS ONLY   Location: [facility], [address], [place], [place] [address] Clinic Hours: By appointment only. Monday - Thursday 8am-5pm, Friday 8am-12pm Services: Cleanings, fillings, extractions. Payment Options: PAYMENT IS DUE AT THE TIME OF SERVICE. Cash, Visa or MasterCard. Sliding scale - $30 minimum per service. Best way to get seen: Come in to office, complete packet and make an appointment - need proof of income or support monies for each household member and proof of [facility] [place]  residence. Usually takes about a month to get in.     [facility] [phone]   Location: [address], [facility] Hours: Walk-in Urgent Care Dental Services are offered Monday-Friday mornings only. The numbers of emergencies accepted daily is limited to the number of providers available. Maximum 15 - Mondays, Wednesdays & Thursdays Maximum 10 - Tuesdays & Fridays Services: You do not need to be a [facility] resident to be seen for a dental emergency. Emergencies are defined as pain, swelling, abnormal bleeding, or dental trauma. Walkins will receive x-rays if needed. NOTE: Dental cleaning is not an emergency. Payment Options: PAYMENT IS DUE AT THE TIME OF SERVICE. Minimum co-pay is $40.00 for uninsured patients. Minimum co-pay is $3.00 for Medicaid with dental coverage. Dental Insurance is accepted and must be presented at time of visit. Medicare does not cover dental. Forms of payment: Cash, credit card, checks. Best way to get seen: If not previously registered with the clinic, walk-in dental registration begins at 7:15 am and is on a first come/first serve basis. If previously registered with the clinic, call to make an appointment.     [facility] [phone] [place] RESIDENTS ONLY   Location: [address] [place], [place] Clinic Hours: Mon-Thu 10a-2p Services: Extractions only! Payment Options: FREE (donations accepted) - bring proof of income or support Best way to get seen: Call and schedule an appointment OR come at 8am on the 1st Monday of every month (except for holidays) when it is first come/first served.     [facility] [phone]   Location: [address] [place], [place] Clinic Hours: Friday mornings Services, Payment Options, Best way to get seen: Call for info
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
## 2019-01-03 ENCOUNTER — Emergency Department
Admission: EM | Admit: 2019-01-03 | Discharge: 2019-01-04 | Discharge status: Against Medical Advice | Payer: Self-pay | Attending: Emergency Medicine | Admitting: Emergency Medicine

## 2019-01-03 ENCOUNTER — Other Ambulatory Visit: Payer: Self-pay

## 2019-01-03 ENCOUNTER — Encounter: Payer: Self-pay | Admitting: Emergency Medicine

## 2019-01-03 DIAGNOSIS — Z5321 Procedure and treatment not carried out due to patient leaving prior to being seen by health care provider: Secondary | ICD-10-CM | POA: Insufficient documentation

## 2019-01-03 NOTE — ED Triage Notes (Signed)
Pt c/o right sided jaw pain x1 day. Pt has had TMJ and jaw dislocation.

## 2019-01-10 ENCOUNTER — Encounter: Payer: Self-pay | Admitting: *Deleted

## 2019-01-10 ENCOUNTER — Other Ambulatory Visit: Payer: Self-pay

## 2019-01-10 ENCOUNTER — Emergency Department
Admission: EM | Admit: 2019-01-10 | Discharge: 2019-01-10 | Disposition: A | Discharge status: Home / Self Care | Payer: Self-pay | Attending: Emergency Medicine | Admitting: Emergency Medicine

## 2019-01-10 DIAGNOSIS — J069 Acute upper respiratory infection, unspecified: Secondary | ICD-10-CM | POA: Insufficient documentation

## 2019-01-10 DIAGNOSIS — J45909 Unspecified asthma, uncomplicated: Secondary | ICD-10-CM | POA: Insufficient documentation

## 2019-01-10 DIAGNOSIS — F17210 Nicotine dependence, cigarettes, uncomplicated: Secondary | ICD-10-CM | POA: Insufficient documentation

## 2019-01-10 DIAGNOSIS — Z20828 Contact with and (suspected) exposure to other viral communicable diseases: Secondary | ICD-10-CM | POA: Insufficient documentation

## 2019-01-10 DIAGNOSIS — Z79899 Other long term (current) drug therapy: Secondary | ICD-10-CM | POA: Insufficient documentation

## 2019-01-10 MED ORDER — BENZONATATE 100 MG PO CAPS: 100.0000 mg | ORAL_CAPSULE | Freq: Three times a day (TID) | ORAL | 0 refills | Status: AC | PRN

## 2019-01-10 NOTE — ED Triage Notes (Signed)
Pt to ED with generalized body aches, sore throat, headaches and congestion since yesterday. No cough or SOB and no fevers.

## 2019-01-10 NOTE — ED Provider Notes (Signed)
[facility] Emergency Department Provider Note  ____________________________________________  Time seen: Approximately 6:39 PM  I have reviewed the triage vital signs and the nursing notes.   HISTORY  Chief Complaint Generalized Body Aches and Sore Throat    HPI Nathan Koch is a 28 y.o. male presents to the emergency department with chills, body aches, sore throat, headache and nasal congestion.  Patient's girlfriend has experienced similar symptoms.  No recent travel.  Patient denies chest pain, chest tightness or abdominal pain.  No rash.  No emesis or diarrhea.  Patient denies increased work of breathing at home.  No other alleviating measures been attempted.        Past Medical History:  Diagnosis Date  . Anxiety   . Asthma   . Depression     Patient Active Problem List   Diagnosis Date Noted  . Asthma 06/26/2014    Past Surgical History:  Procedure Laterality Date  . CLOSED REDUCTION MANDIBLE N/A 07/02/2018   Procedure: CLOSED REDUCTION MANDIBULAR;  Surgeon: Vernie Murders, MD;  Location: [facility] ORS;  Service: ENT;  Laterality: N/A;    Prior to Admission medications   Medication Sig Start Date End Date Taking? Authorizing Provider  albuterol (PROVENTIL HFA;VENTOLIN HFA) 108 (90 Base) MCG/ACT inhaler Inhale 2 puffs into the lungs every 4 (four) hours as needed for wheezing or shortness of breath. 10/10/17   Triplett, Cari B, FNP  benzonatate (TESSALON PERLES) 100 MG capsule Take 1 capsule (100 mg total) by mouth 3 (three) times daily as needed for up to 7 days for cough. 01/10/19 01/17/19  Orvil Feil, PA-C  cyclobenzaprine (FLEXERIL) 5 MG tablet Take 1 tablet (5 mg total) by mouth 3 (three) times daily as needed for muscle spasms. 08/18/18   Menshew, Charlesetta Ivory, PA-C  Fluticasone-Salmeterol (ADVAIR DISKUS) 250-50 MCG/DOSE AEPB Inhale 1 puff into the lungs 2 (two) times daily. 10/10/17   Triplett, Rulon Eisenmenger B, FNP  ondansetron (ZOFRAN ODT) 4 MG  disintegrating tablet Take 1 tablet (4 mg total) by mouth every 8 (eight) hours as needed for nausea or vomiting. 07/18/18   Arnaldo Natal, MD    Allergies Ibuprofen  Family History  Problem Relation Age of Onset  . Asthma Mother   . Asthma Brother     Social History Social History   Tobacco Use  . Smoking status: Current Every Day Smoker    Packs/day: 1.00    Types: Cigarettes  . Smokeless tobacco: Never Used  . Tobacco comment: started smoking again December 2019  Substance Use Topics  . Alcohol use: Not Currently    Alcohol/week: 7.0 standard drinks    Types: 7 Standard drinks or equivalent per week  . Drug use: Yes    Types: Methamphetamines    Comment: last used about 4 weeks ago      Review of Systems  Constitutional: Patient has fever.  Eyes: No visual changes. No discharge ENT: Patient has congestion.  Cardiovascular: no chest pain. Respiratory: Patient has cough.  Gastrointestinal: No abdominal pain.  No nausea, no vomiting. Patient had diarrhea.  Genitourinary: Negative for dysuria. No hematuria Musculoskeletal: Patient has myalgias.  Skin: Negative for rash, abrasions, lacerations, ecchymosis. Neurological: Patient has headache, no focal weakness or numbness.     ____________________________________________   PHYSICAL EXAM:  VITAL SIGNS: ED Triage Vitals [01/10/19 1814]  Enc Vitals Group     BP 121/66     Pulse Rate 89     Resp 18  Temp 98.3 F (36.8 C)     Temp Source Oral     SpO2 98 %     Weight 166 lb (75.3 kg)     Height 5\' 8"  (1.727 m)     Head Circumference      Peak Flow      Pain Score 7     Pain Loc      Pain Edu?      Excl. in [place]?      Constitutional: Alert and oriented. Patient is lying supine. Eyes: Conjunctivae are normal. PERRL. EOMI. Head: Atraumatic. ENT:      Ears: Tympanic membranes are mildly injected with mild effusion bilaterally.       Nose: No congestion/rhinnorhea.      Mouth/Throat: Mucous  membranes are moist. Posterior pharynx is mildly erythematous.  Hematological/Lymphatic/Immunilogical: No cervical lymphadenopathy.  Cardiovascular: Normal rate, regular rhythm. Normal S1 and S2.  Good peripheral circulation. Respiratory: Normal respiratory effort without tachypnea or retractions. Lungs CTAB. Good air entry to the bases with no decreased or absent breath sounds. Gastrointestinal: Bowel sounds 4 quadrants. Soft and nontender to palpation. No guarding or rigidity. No palpable masses. No distention. No CVA tenderness. Musculoskeletal: Full range of motion to all extremities. No gross deformities appreciated. Neurologic:  Normal speech and language. No gross focal neurologic deficits are appreciated.  Skin:  Skin is warm, dry and intact. No rash noted. Psychiatric: Mood and affect are normal. Speech and behavior are normal. Patient exhibits appropriate insight and judgement.    ____________________________________________   LABS (all labs ordered are listed, but only abnormal results are displayed)  Labs Reviewed  SARS CORONAVIRUS 2 (TAT 6-24 HRS)   ____________________________________________  EKG   ____________________________________________  RADIOLOGY   No results found.  ____________________________________________    PROCEDURES  Procedure(s) performed:    Procedures    Medications - No data to display   ____________________________________________   INITIAL IMPRESSION / ASSESSMENT AND PLAN / ED COURSE  Pertinent labs & imaging results that were available during my care of the patient were reviewed by me and considered in my medical decision making (see chart for details).  Review of the [place] CSRS was performed in accordance of the [place] prior to dispensing any controlled drugs.           Assessment and plan Unspecified viral URI 28 year old male presents to the emergency department with viral URI-like symptoms.  Vital signs were  reassuring in the emergency department.  On physical exam, patient was resting comfortably with no increased work of breathing or use of accessory muscles for respiration.  No adventitious lung sounds were auscultated.  Differential diagnosis includes unspecified viral URI, COVID-19.  COVID-19 testing is pending at this time.  Patient was advised to be quarantined in his home until COVID-19 results return.  Tylenol and ibuprofen were recommended for fever.  Patient was also discharged with [facility].  Return precautions were given.  All patient questions were answered.    ____________________________________________  FINAL CLINICAL IMPRESSION(S) / ED DIAGNOSES  Final diagnoses:  Viral upper respiratory tract infection      NEW MEDICATIONS STARTED DURING THIS VISIT:  ED Discharge Orders         Ordered    benzonatate (TESSALON PERLES) 100 MG capsule  3 times daily PRN     01/10/19 1834              This chart was dictated using voice recognition software/Dragon. Despite best efforts to proofread, errors  can occur which can change the meaning. Any change was purely unintentional.    Orvil FeilWoods, Jodiann Ognibene M, PA-C 01/10/19 1842    Sharman CheekStafford, Phillip, MD 01/10/19 2351

## 2019-01-11 LAB — SARS CORONAVIRUS 2 (TAT 6-24 HRS): SARS Coronavirus 2: NEGATIVE

## 2019-02-06 ENCOUNTER — Emergency Department
Admission: EM | Admit: 2019-02-06 | Discharge: 2019-02-07 | Disposition: A | Discharge status: Other Acute Inpatient Hospital | Payer: Self-pay | Attending: Emergency Medicine | Admitting: Emergency Medicine

## 2019-02-06 ENCOUNTER — Other Ambulatory Visit: Payer: Self-pay

## 2019-02-06 DIAGNOSIS — R45851 Suicidal ideations: Secondary | ICD-10-CM | POA: Insufficient documentation

## 2019-02-06 DIAGNOSIS — Z590 Homelessness: Secondary | ICD-10-CM | POA: Insufficient documentation

## 2019-02-06 DIAGNOSIS — J45909 Unspecified asthma, uncomplicated: Secondary | ICD-10-CM | POA: Insufficient documentation

## 2019-02-06 DIAGNOSIS — F17210 Nicotine dependence, cigarettes, uncomplicated: Secondary | ICD-10-CM | POA: Insufficient documentation

## 2019-02-06 DIAGNOSIS — F329 Major depressive disorder, single episode, unspecified: Secondary | ICD-10-CM | POA: Insufficient documentation

## 2019-02-06 DIAGNOSIS — Z20828 Contact with and (suspected) exposure to other viral communicable diseases: Secondary | ICD-10-CM | POA: Insufficient documentation

## 2019-02-06 LAB — CBC
HCT: 42 % (ref 39.0–52.0)
Hemoglobin: 13.6 g/dL (ref 13.0–17.0)
MCH: 28.2 pg (ref 26.0–34.0)
MCHC: 32.4 g/dL (ref 30.0–36.0)
MCV: 87.1 fL (ref 80.0–100.0)
Platelets: 313 10*3/uL (ref 150–400)
RBC: 4.82 MIL/uL (ref 4.22–5.81)
RDW: 13.3 % (ref 11.5–15.5)
WBC: 7.9 10*3/uL (ref 4.0–10.5)
nRBC: 0 % (ref 0.0–0.2)

## 2019-02-06 LAB — COMPREHENSIVE METABOLIC PANEL
ALT: 14 U/L (ref 0–44)
AST: 17 U/L (ref 15–41)
Albumin: 3.9 g/dL (ref 3.5–5.0)
Alkaline Phosphatase: 48 U/L (ref 38–126)
Anion gap: 11 (ref 5–15)
BUN: 13 mg/dL (ref 6–20)
CO2: 22 mmol/L (ref 22–32)
Calcium: 8.9 mg/dL (ref 8.9–10.3)
Chloride: 105 mmol/L (ref 98–111)
Creatinine, Ser: 0.68 mg/dL (ref 0.61–1.24)
GFR calc Af Amer: >60 mL/min (ref >60)
GFR calc non Af Amer: >60 mL/min (ref >60)
Glucose, Bld: 82 mg/dL (ref 70–99)
Potassium: 4.2 mmol/L (ref 3.5–5.1)
Sodium: 138 mmol/L (ref 135–145)
Total Bilirubin: 0.7 mg/dL (ref 0.3–1.2)
Total Protein: 7.3 g/dL (ref 6.5–8.1)

## 2019-02-06 LAB — ETHANOL: Alcohol, Ethyl (B): <10 mg/dL (ref <10)

## 2019-02-06 LAB — SALICYLATE LEVEL: Salicylate Lvl: <7 mg/dL (ref 2.8–30.0)

## 2019-02-06 LAB — ACETAMINOPHEN LEVEL: Acetaminophen (Tylenol), Serum: <10 ug/mL — ABNORMAL LOW (ref 10–30)

## 2019-02-06 NOTE — ED Provider Notes (Addendum)
[facility] Emergency Department Provider Note    First MD Initiated Contact with Patient 02/06/19 2023     (approximate)  I have reviewed the triage vital signs and the nursing notes.   HISTORY  Chief Complaint Mental Health Problem    HPI Nathan Koch is a 28 y.o. male below listed past medical history presents the ER for feelings of depression anxiety having difficulty sleeping and generalized anhedonia.  States that his wife recently moved out.  He is currently homeless.  Has been off of his antidepressant medications for quite some time.  Has required hospitalization for depression in the past but this was back in 2013.  Denies any other complaints at this time.  No hallucinations.  Does have a plan to harm himself and plans to stab himself.    Past Medical History:  Diagnosis Date  . Anxiety   . Asthma   . Depression    Family History  Problem Relation Age of Onset  . Asthma Mother   . Asthma Brother    Past Surgical History:  Procedure Laterality Date  . CLOSED REDUCTION MANDIBLE N/A 07/02/2018   Procedure: CLOSED REDUCTION MANDIBULAR;  Surgeon: Vernie Murders, MD;  Location: [facility] ORS;  Service: ENT;  Laterality: N/A;   Patient Active Problem List   Diagnosis Date Noted  . Asthma 06/26/2014      Prior to Admission medications   Medication Sig Start Date End Date Taking? Authorizing Provider  albuterol (PROVENTIL HFA;VENTOLIN HFA) 108 (90 Base) MCG/ACT inhaler Inhale 2 puffs into the lungs every 4 (four) hours as needed for wheezing or shortness of breath. 10/10/17   Triplett, Rulon Eisenmenger B, FNP  cyclobenzaprine (FLEXERIL) 5 MG tablet Take 1 tablet (5 mg total) by mouth 3 (three) times daily as needed for muscle spasms. 08/18/18   Menshew, Charlesetta Ivory, PA-C  Fluticasone-Salmeterol (ADVAIR DISKUS) 250-50 MCG/DOSE AEPB Inhale 1 puff into the lungs 2 (two) times daily. 10/10/17   Triplett, Rulon Eisenmenger B, FNP  ondansetron (ZOFRAN ODT) 4 MG disintegrating  tablet Take 1 tablet (4 mg total) by mouth every 8 (eight) hours as needed for nausea or vomiting. 07/18/18   Arnaldo Natal, MD    Allergies Ibuprofen    Social History Social History   Tobacco Use  . Smoking status: Current Every Day Smoker    Packs/day: 1.00    Types: Cigarettes  . Smokeless tobacco: Never Used  . Tobacco comment: started smoking again December 2019  Substance Use Topics  . Alcohol use: Not Currently    Alcohol/week: 7.0 standard drinks    Types: 7 Standard drinks or equivalent per week  . Drug use: Yes    Types: Methamphetamines    Comment: last used about 4 weeks ago    Review of Systems Patient denies headaches, rhinorrhea, blurry vision, numbness, shortness of breath, chest pain, edema, cough, abdominal pain, nausea, vomiting, diarrhea, dysuria, fevers, rashes or hallucinations unless otherwise stated above in HPI. ____________________________________________   PHYSICAL EXAM:  VITAL SIGNS: Vitals:   02/06/19 1934  BP: (!) 121/55  Pulse: (!) 57  Resp: 16  Temp: 98 F (36.7 C)  SpO2: 100%    Constitutional: Alert and oriented.  Eyes: Conjunctivae are normal.  Head: Atraumatic. Nose: No congestion/rhinnorhea. Mouth/Throat: Mucous membranes are moist.   Neck: No stridor. Painless ROM.  Cardiovascular: Normal rate, regular rhythm. Grossly normal heart sounds.  Good peripheral circulation. Respiratory: Normal respiratory effort.  No retractions. Lungs CTAB. Gastrointestinal: Soft and  nontender. No distention. No abdominal bruits. No CVA tenderness. Genitourinary:  Musculoskeletal: No lower extremity tenderness nor edema.  No joint effusions. Neurologic:  Normal speech and language. No gross focal neurologic deficits are appreciated. No facial droop Skin:  Skin is warm, dry and intact. No rash noted. Psychiatric: Mood and affect are withdrawn and depressed. Speech and behavior are normal.  ____________________________________________    LABS (all labs ordered are listed, but only abnormal results are displayed)  Results for orders placed or performed during the hospital encounter of 02/06/19 (from the past 24 hour(s))  Comprehensive metabolic panel     Status: None   Collection Time: 02/06/19  7:54 PM  Result Value Ref Range   Sodium 138 135 - 145 mmol/L   Potassium 4.2 3.5 - 5.1 mmol/L   Chloride 105 98 - 111 mmol/L   CO2 22 22 - 32 mmol/L   Glucose, Bld 82 70 - 99 mg/dL   BUN 13 6 - 20 mg/dL   Creatinine, Ser 6.960.68 0.61 - 1.24 mg/dL   Calcium 8.9 8.9 - 29.510.3 mg/dL   Total Protein 7.3 6.5 - 8.1 g/dL   Albumin 3.9 3.5 - 5.0 g/dL   AST 17 15 - 41 U/L   ALT 14 0 - 44 U/L   Alkaline Phosphatase 48 38 - 126 U/L   Total Bilirubin 0.7 0.3 - 1.2 mg/dL   GFR calc non Af Amer >60 >60 mL/min   GFR calc Af Amer >60 >60 mL/min   Anion gap 11 5 - 15  Ethanol     Status: None   Collection Time: 02/06/19  7:54 PM  Result Value Ref Range   Alcohol, Ethyl (B) <10 <10 mg/dL  Salicylate level     Status: None   Collection Time: 02/06/19  7:54 PM  Result Value Ref Range   Salicylate Lvl <7.0 2.8 - 30.0 mg/dL  Acetaminophen level     Status: Abnormal   Collection Time: 02/06/19  7:54 PM  Result Value Ref Range   Acetaminophen (Tylenol), Serum <10 (L) 10 - 30 ug/mL  cbc     Status: None   Collection Time: 02/06/19  7:54 PM  Result Value Ref Range   WBC 7.9 4.0 - 10.5 K/uL   RBC 4.82 4.22 - 5.81 MIL/uL   Hemoglobin 13.6 13.0 - 17.0 g/dL   HCT 28.442.0 13.239.0 - 44.052.0 %   MCV 87.1 80.0 - 100.0 fL   MCH 28.2 26.0 - 34.0 pg   MCHC 32.4 30.0 - 36.0 g/dL   RDW 10.213.3 72.511.5 - 36.615.5 %   Platelets 313 150 - 400 K/uL   nRBC 0.0 0.0 - 0.2 %   ____________________________________________  EKG____________________________________________  RADIOLOGY   ____________________________________________   PROCEDURES  Procedure(s) performed:  Procedures    Critical Care performed: no ____________________________________________    INITIAL IMPRESSION / ASSESSMENT AND PLAN / ED COURSE  Pertinent labs & imaging results that were available during my care of the patient were reviewed by me and considered in my medical decision making (see chart for details).   DDX: Psychosis, delirium, medication effect, noncompliance, polysubstance abuse, Si, Hi, depression  Isaiah L Dan HumphreysWalker is a 28 y.o. who presents to the ED with for evaluation of depression and SI.  Patient has psych history of depression.  Laboratory testing was ordered to evaluation for underlying electrolyte derangement or signs of underlying organic pathology to explain today's presentation.  Based on history and physical and laboratory evaluation, it appears that the  patient's presentation is 2/2 underlying psychiatric disorder and will require further evaluation and management by inpatient psychiatry.  Patient was  made an IVC due to SI with a plan. Has been accepted to inpatient psych.  Held in ER under IVC pending bed availability.      The patient was evaluated in Emergency Department today for the symptoms described in the history of present illness. He/she was evaluated in the context of the global COVID-19 pandemic, which necessitated consideration that the patient might be at risk for infection with the SARS-CoV-2 virus that causes COVID-19. Institutional protocols and algorithms that pertain to the evaluation of patients at risk for COVID-19 are in a state of rapid change based on information released by regulatory bodies including the CDC and federal and state organizations. These policies and algorithms were followed during the patient's care in the ED.  As part of my medical decision making, I reviewed the following data within the [place] notes reviewed and incorporated, Labs reviewed, notes from prior ED visits and [place] Controlled Substance Database   ____________________________________________   FINAL CLINICAL IMPRESSION(S) / ED  DIAGNOSES  Final diagnoses:  Suicidal ideation      NEW MEDICATIONS STARTED DURING THIS VISIT:  New Prescriptions   No medications on file     Note:  This document was prepared using Dragon voice recognition software and may include unintentional dictation errors.    Merlyn Lot, MD 02/06/19 2039    Merlyn Lot, MD 02/06/19 2051

## 2019-02-06 NOTE — ED Notes (Signed)
Patient has been accepted to [facility].  Patient assigned to [facility] Accepting physician is Dr. Jonelle Sports.  Call report to [phone].  Representative was [employer].   ER Staff is aware of it:  Carlene ER Secretary  Dr. Quentin Cornwall, ER MD  Sherri Patient's Nurse     Patient is to arrive after 9:00 a.m.  Patient is to go to the [facility]"  [address], [place], [place] [phone] to be checked.  Please send discharge summary and medication list

## 2019-02-06 NOTE — ED Notes (Signed)
Pt belongings include: 1 black baseball cap, 1 black/gray jacket, 1 pr black tennis shoes, 1 gray jacket, 1 black tshirt, 1 pt tan shorts, 1 cell phone, 1 ID card, assorted collection of hair clips and hair ties. No cash, wallet, jewelry, or other valuables. Belongings bagged and labeled per policy.

## 2019-02-06 NOTE — ED Triage Notes (Signed)
Pt arrives to ED via [facility] under IVC from Home for "suicidal ideation, history of attempts, cannot contract for safety, plan to cut himself with a knife or razor. He has a history of attempts by cutting and strangulation with a belt". Pt arrives calm and cooperative; pt denies use of illegal drugs or ETOH PTA.

## 2019-02-06 NOTE — ED Notes (Signed)
Pt given meal tray.

## 2019-02-07 LAB — SARS CORONAVIRUS 2 BY RT PCR (HOSPITAL ORDER, PERFORMED IN ~~LOC~~ HOSPITAL LAB): SARS Coronavirus 2: NEGATIVE

## 2019-02-07 NOTE — ED Notes (Signed)
Pt discharged under IVC to [facility]. VS stable. Report given to [facility].  All belongings given to officer.  Pt cooperative.

## 2019-02-07 NOTE — ED Provider Notes (Signed)
-----------------------------------------   6:11 AM on 02/07/2019 -----------------------------------------   Blood pressure (!) 121/55, pulse (!) 57, temperature 98 F (36.7 C), temperature source Oral, resp. rate 16, height 5\' 9"  (1.753 m), weight 72.6 kg, SpO2 100 %.  The patient is calm and cooperative at this time.  There have been no acute events since the last update.  Awaiting disposition plan from Behavioral Medicine and/or Social Work team(s).   Paulette Blanch, MD 02/07/19 [phone]

## 2019-02-07 NOTE — ED Notes (Signed)
Report given to RN at [facility].

## 2019-02-07 NOTE — ED Notes (Signed)
Pt. To [facility] from ED ambulatory without difficulty, to room  5. Report from [facility]. Pt. Is alert and oriented, warm and dry in no distress. Pt. Denies HI, and AVH. Pt states having SI thoughts with plan to cut self. Patient contracts for safety with this Probation officer. Pt. Calm and cooperative. Pt. Made aware of security cameras and Q15 minute rounds. Pt. Encouraged to let Nursing staff know of any concerns or needs.

## 2019-02-07 NOTE — ED Notes (Signed)

## 2019-02-07 NOTE — ED Notes (Addendum)
Report received from [facility]. Patient to be transferred to room [facility]

## 2019-02-21 DIAGNOSIS — F17200 Nicotine dependence, unspecified, uncomplicated: Secondary | ICD-10-CM | POA: Insufficient documentation

## 2019-02-21 DIAGNOSIS — F4310 Post-traumatic stress disorder, unspecified: Secondary | ICD-10-CM | POA: Insufficient documentation

## 2019-03-26 IMAGING — CR DG MANDIBLE 1-3V
1 series · 3 of 3 positions shown · non-contrast
Comparison: None.

CLINICAL DATA: Patient fracture the jaw 1 year ago. Now has
episodes where it locks up. Slurred speech. No current injury.

EXAM:
MANDIBLE - 1-3 VIEW

[Series 1: dg mandible 1-3 views · 0.14mm/px · 3 of 3 slices shown]
[im 1/3]
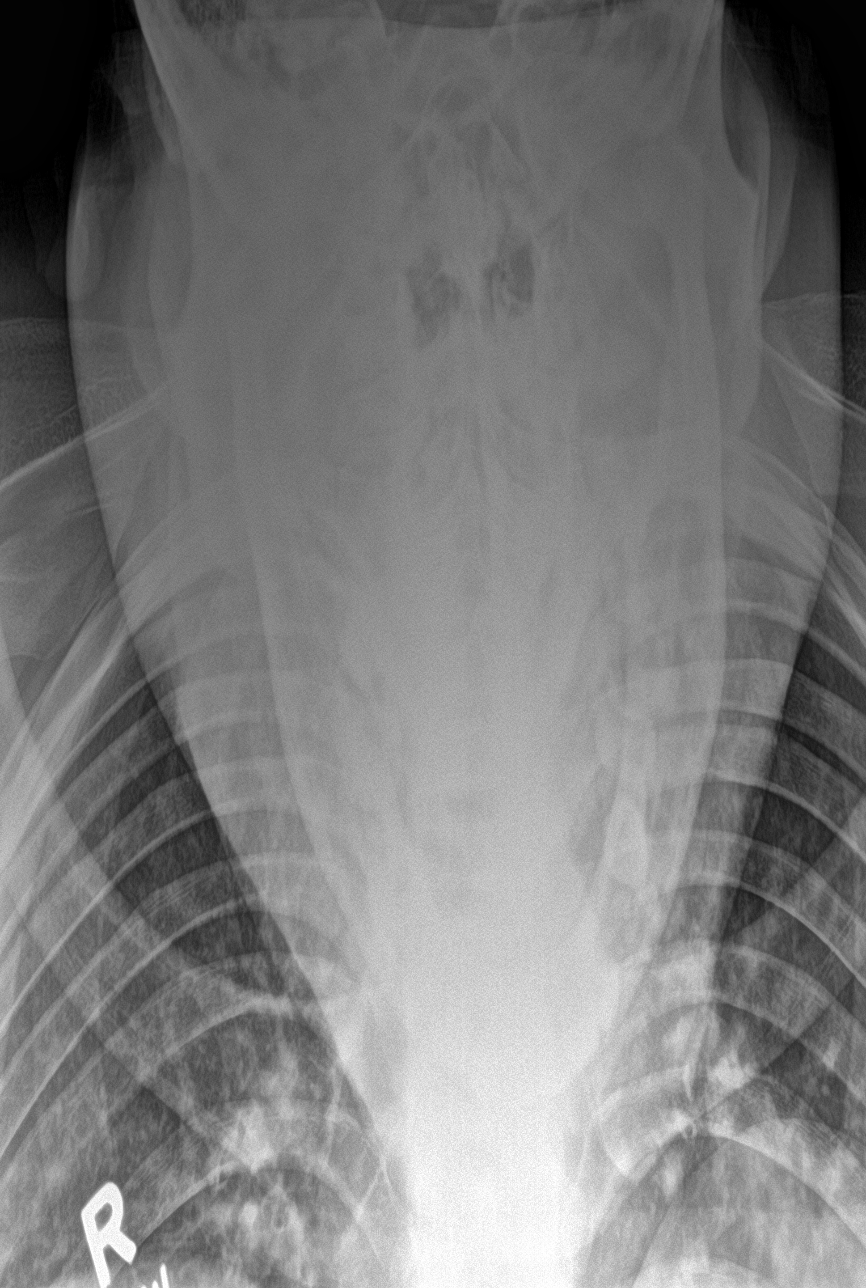
[im 2/3]
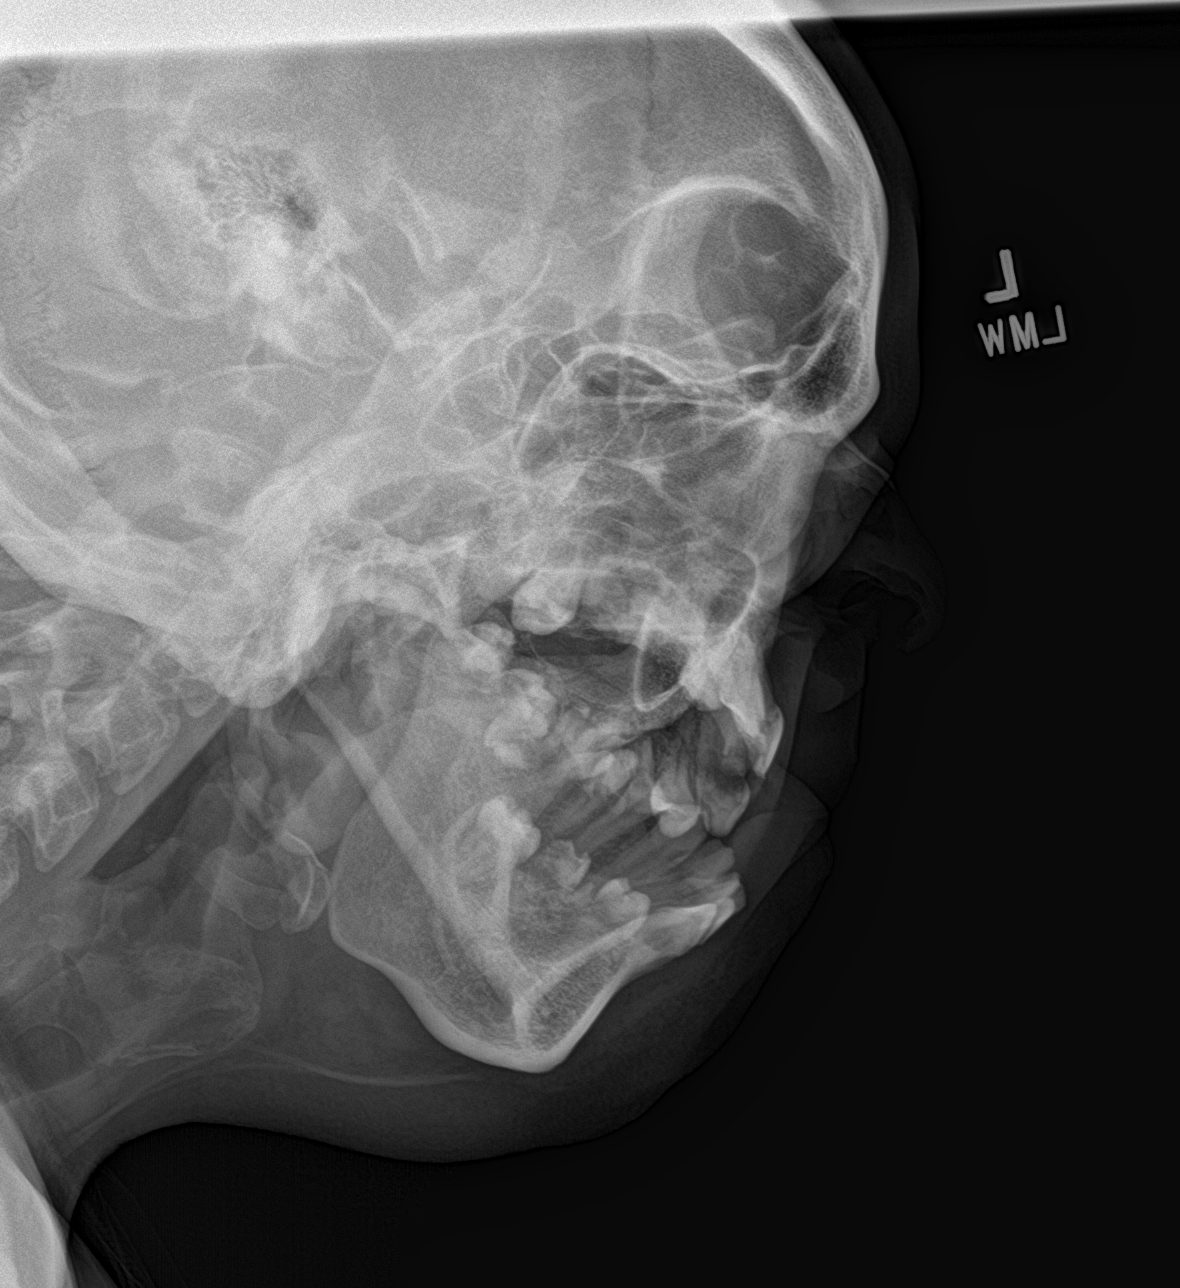
[im 3/3]
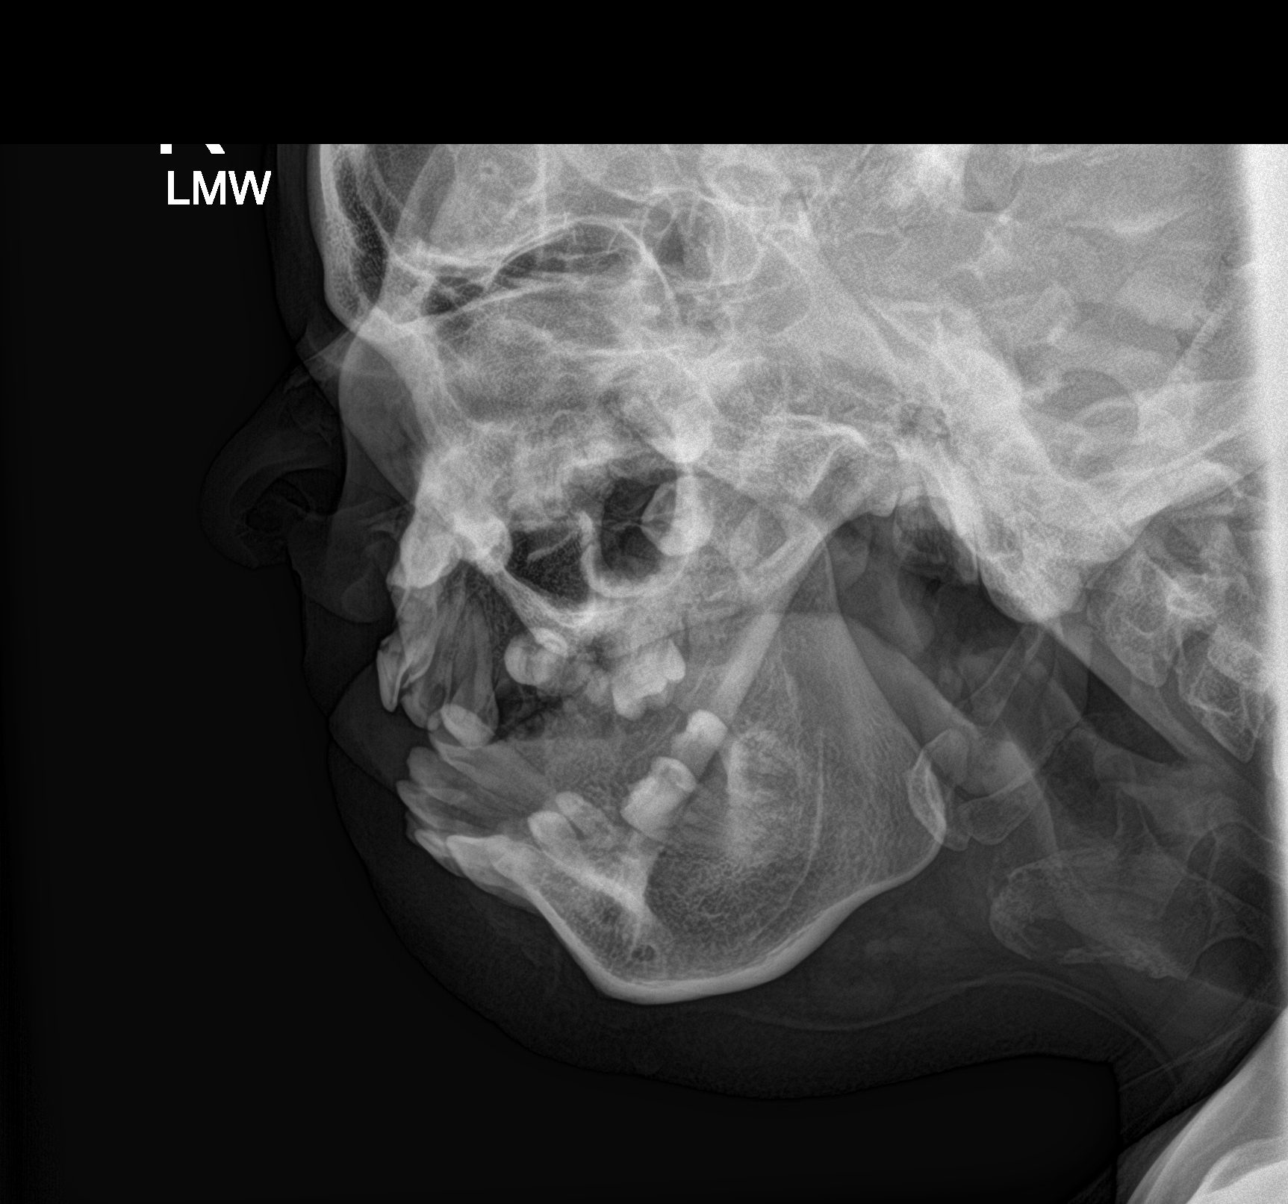

[3 of 3 positions shown; findings below may reference images not displayed]

FINDINGS: The mandible appears intact. No acute displaced fractures are
identified. No focal bone lesion or bone destruction. Limited
visualization of the left TMJ due to positioning. Right TMJ is
imaged in the open position. Soft tissues are unremarkable.
IMPRESSION: No acute bony abnormalities identified. Limited examination due to
positioning.

## 2019-05-11 ENCOUNTER — Emergency Department
Admission: EM | Admit: 2019-05-11 | Discharge: 2019-05-11 | Disposition: A | Discharge status: Home / Self Care | Payer: Self-pay | Attending: Emergency Medicine | Admitting: Emergency Medicine

## 2019-05-11 ENCOUNTER — Other Ambulatory Visit: Payer: Self-pay

## 2019-05-11 DIAGNOSIS — F17210 Nicotine dependence, cigarettes, uncomplicated: Secondary | ICD-10-CM | POA: Insufficient documentation

## 2019-05-11 DIAGNOSIS — B349 Viral infection, unspecified: Secondary | ICD-10-CM | POA: Insufficient documentation

## 2019-05-11 DIAGNOSIS — Z20822 Contact with and (suspected) exposure to covid-19: Secondary | ICD-10-CM | POA: Insufficient documentation

## 2019-05-11 LAB — GROUP A STREP BY PCR: Group A Strep by PCR: NOT DETECTED

## 2019-05-11 MED ORDER — LIDOCAINE VISCOUS HCL 2 % MT SOLN
15.0000 mL | Freq: Once | OROMUCOSAL | Status: AC
  Administered 2019-05-11: 15 mL via OROMUCOSAL
  Filled 2019-05-11: qty 15

## 2019-05-11 MED ORDER — LIDOCAINE VISCOUS HCL 2 % MT SOLN: 15.0000 mL | OROMUCOSAL | 0 refills | Status: DC | PRN

## 2019-05-11 NOTE — Discharge Instructions (Signed)
Stay home until your symptoms have resolved. If your COVID-19 is positive, stay home for 10 days. Follow up with primary care or return to the ER for symptoms that change or worsen.

## 2019-05-11 NOTE — ED Triage Notes (Signed)
Pt in with co sore throat for a few days, states painful to swallow and talk. States also has cough with brownish sputum. Pt states having some chest soreness states when he coughs or takes a deep breath.

## 2019-05-11 NOTE — ED Provider Notes (Addendum)
[facility] Emergency Department Provider Note  ____________________________________________  Time seen: Approximately 9:57 PM  I have reviewed the triage vital signs and the nursing notes.   HISTORY  Chief Complaint Cough and Sore Throat   HPI Nathan Koch is a 29 y.o. male presents to the emergency department for sore throat, headache, body aches, diarrhea, chest tightness. Throat has been getting worse. No fever. Girlfriend is sick also with similar symptoms.   Past Medical History:  Diagnosis Date  . Anxiety   . Asthma   . Depression     Patient Active Problem List   Diagnosis Date Noted  . Asthma 06/26/2014    Past Surgical History:  Procedure Laterality Date  . CLOSED REDUCTION MANDIBLE N/A 07/02/2018   Procedure: CLOSED REDUCTION MANDIBULAR;  Surgeon: Vernie Murders, MD;  Location: [facility] ORS;  Service: ENT;  Laterality: N/A;    Prior to Admission medications   Medication Sig Start Date End Date Taking? Authorizing Provider  albuterol (PROVENTIL HFA;VENTOLIN HFA) 108 (90 Base) MCG/ACT inhaler Inhale 2 puffs into the lungs every 4 (four) hours as needed for wheezing or shortness of breath. 10/10/17   Loie Jahr, Rulon Eisenmenger B, FNP  cyclobenzaprine (FLEXERIL) 5 MG tablet Take 1 tablet (5 mg total) by mouth 3 (three) times daily as needed for muscle spasms. 08/18/18   Menshew, Charlesetta Ivory, PA-C  Fluticasone-Salmeterol (ADVAIR DISKUS) 250-50 MCG/DOSE AEPB Inhale 1 puff into the lungs 2 (two) times daily. 10/10/17   Palmina Clodfelter B, FNP  lidocaine (XYLOCAINE) 2 % solution Use as directed 15 mLs in the mouth or throat as needed for mouth pain. 05/11/19   Katja Blue, Rulon Eisenmenger B, FNP  ondansetron (ZOFRAN ODT) 4 MG disintegrating tablet Take 1 tablet (4 mg total) by mouth every 8 (eight) hours as needed for nausea or vomiting. 07/18/18   Arnaldo Natal, MD    Allergies Ibuprofen  Family History  Problem Relation Age of Onset  . Asthma Mother   . Asthma Brother      Social History Social History   Tobacco Use  . Smoking status: Current Every Day Smoker    Packs/day: 1.00    Types: Cigarettes  . Smokeless tobacco: Never Used  . Tobacco comment: started smoking again December 2019  Substance Use Topics  . Alcohol use: Not Currently    Alcohol/week: 7.0 standard drinks    Types: 7 Standard drinks or equivalent per week  . Drug use: Yes    Types: Methamphetamines    Comment: last used about 4 weeks ago    Review of Systems Constitutional: Negative for fever/positive for chills. Normal appetite. ENT: Positive for sore throat. Cardiovascular: Denies chest pain. Respiratory: Negative for shortness of breath. Positive for cough. Negative wheezing.  Gastrointestinal: Negative nausea,  no vomiting.  Positive diarrhea.  Musculoskeletal: Positive for body aches Skin: Negative for rash. Neurological: Positive for headaches ____________________________________________   PHYSICAL EXAM:  VITAL SIGNS: ED Triage Vitals [05/11/19 2034]  Enc Vitals Group     BP 131/72     Pulse Rate 66     Resp 20     Temp 98.9 F (37.2 C)     Temp Source Oral     SpO2 99 %     Weight 190 lb (86.2 kg)     Height 5\' 9"  (1.753 m)     Head Circumference      Peak Flow      Pain Score 8     Pain Loc  Pain Edu?      Excl. in [place]?     Constitutional: Alert and oriented. Well appearing and in no acute distress. Eyes: Conjunctivae are normal. Ears: TM normal Nose: No sinus congestion noted; no rhinnorhea. Mouth/Throat: Mucous membranes are moist.  Oropharynx erythematous. Tonsils 2+. Uvula midline. Neck: No stridor.  Lymphatic: No cervical lymphadenopathy. Cardiovascular: Normal rate, regular rhythm. Good peripheral circulation. Respiratory: Respirations are even and unlabored.  No retractions. Breath sounds clear. Gastrointestinal: Soft and nontender.  Musculoskeletal: FROM x 4 extremities.  Neurologic:  Normal speech and language. Skin:  Skin is  warm, dry and intact. No rash noted. Psychiatric: Mood and affect are normal. Speech and behavior are normal.  ____________________________________________   LABS (all labs ordered are listed, but only abnormal results are displayed)  Labs Reviewed  GROUP A STREP BY PCR  SARS CORONAVIRUS 2 (TAT 6-24 HRS)   ____________________________________________  EKG  Not indicated. ____________________________________________  RADIOLOGY  Not indicated. ____________________________________________   PROCEDURES  Procedure(s) performed: None  Critical Care performed: No ____________________________________________   INITIAL IMPRESSION / ASSESSMENT AND PLAN / ED COURSE  29 y.o. male presents to the ER for sore throat and other complaints as listed in HPI. Will test for strep and send COVID-19 out.    Strep screen is negative. Will discharge with viscous lidocaine prescription for sore throat. COVID-19 instructions provided as well. He is to follow up with primary care or return to the ER for symptoms that change or worsen.  [name] was evaluated in Emergency Department on 05/11/2019 for the symptoms described in the history of present illness. He was evaluated in the context of the global COVID-19 pandemic, which necessitated consideration that the patient might be at risk for infection with the SARS-CoV-2 virus that causes COVID-19. Institutional protocols and algorithms that pertain to the evaluation of patients at risk for COVID-19 are in a state of rapid change based on information released by regulatory bodies including the CDC and federal and state organizations. These policies and algorithms were followed during the patient's care in the ED.    Medications  lidocaine (XYLOCAINE) 2 % viscous mouth solution 15 mL (15 mLs Mouth/Throat Given 05/11/19 2331)    ED Discharge Orders         Ordered    lidocaine (XYLOCAINE) 2 % solution  As needed     05/11/19 2325            Pertinent labs & imaging results that were available during my care of the patient were reviewed by me and considered in my medical decision making (see chart for details).    If controlled substance prescribed during this visit, 12 month history viewed on the [place] prior to issuing an initial prescription for Schedule II or III opiod. ____________________________________________   FINAL CLINICAL IMPRESSION(S) / ED DIAGNOSES  Final diagnoses:  Acute viral syndrome    Note:  This document was prepared using Dragon voice recognition software and may include unintentional dictation errors.    Victorino Dike, FNP 05/11/19 2331    Victorino Dike, FNP 05/11/19 2332    Duffy Bruce, MD 05/13/19 [phone]

## 2019-05-12 LAB — SARS CORONAVIRUS 2 (TAT 6-24 HRS): SARS Coronavirus 2: NEGATIVE

## 2019-07-29 ENCOUNTER — Ambulatory Visit: Payer: Self-pay

## 2019-08-16 ENCOUNTER — Emergency Department
Admission: EM | Admit: 2019-08-16 | Discharge: 2019-08-17 | Disposition: A | Discharge status: Other Acute Inpatient Hospital | Payer: Self-pay | Attending: Student in an Organized Health Care Education/Training Program | Admitting: Student in an Organized Health Care Education/Training Program

## 2019-08-16 ENCOUNTER — Other Ambulatory Visit: Payer: Self-pay

## 2019-08-16 DIAGNOSIS — Z79899 Other long term (current) drug therapy: Secondary | ICD-10-CM | POA: Insufficient documentation

## 2019-08-16 DIAGNOSIS — R45851 Suicidal ideations: Secondary | ICD-10-CM

## 2019-08-16 DIAGNOSIS — F322 Major depressive disorder, single episode, severe without psychotic features: Secondary | ICD-10-CM | POA: Insufficient documentation

## 2019-08-16 DIAGNOSIS — F4310 Post-traumatic stress disorder, unspecified: Secondary | ICD-10-CM | POA: Insufficient documentation

## 2019-08-16 DIAGNOSIS — F17210 Nicotine dependence, cigarettes, uncomplicated: Secondary | ICD-10-CM | POA: Insufficient documentation

## 2019-08-16 DIAGNOSIS — Z20822 Contact with and (suspected) exposure to covid-19: Secondary | ICD-10-CM | POA: Insufficient documentation

## 2019-08-16 DIAGNOSIS — F1520 Other stimulant dependence, uncomplicated: Secondary | ICD-10-CM | POA: Insufficient documentation

## 2019-08-16 DIAGNOSIS — J45909 Unspecified asthma, uncomplicated: Secondary | ICD-10-CM | POA: Insufficient documentation

## 2019-08-16 LAB — CBC
HCT: 46.4 % (ref 39.0–52.0)
Hemoglobin: 15.6 g/dL (ref 13.0–17.0)
MCH: 29.1 pg (ref 26.0–34.0)
MCHC: 33.6 g/dL (ref 30.0–36.0)
MCV: 86.4 fL (ref 80.0–100.0)
Platelets: 395 10*3/uL (ref 150–400)
RBC: 5.37 MIL/uL (ref 4.22–5.81)
RDW: 13.2 % (ref 11.5–15.5)
WBC: 10.8 10*3/uL — ABNORMAL HIGH (ref 4.0–10.5)
nRBC: 0 % (ref 0.0–0.2)

## 2019-08-16 LAB — URINE DRUG SCREEN, QUALITATIVE (ARMC ONLY)
Amphetamines, Ur Screen: POSITIVE — AB
Barbiturates, Ur Screen: NOT DETECTED
Benzodiazepine, Ur Scrn: NOT DETECTED
Cannabinoid 50 Ng, Ur ~~LOC~~: NOT DETECTED
Cocaine Metabolite,Ur ~~LOC~~: NOT DETECTED
MDMA (Ecstasy)Ur Screen: NOT DETECTED
Methadone Scn, Ur: NOT DETECTED
Opiate, Ur Screen: NOT DETECTED
Phencyclidine (PCP) Ur S: NOT DETECTED
Tricyclic, Ur Screen: POSITIVE — AB

## 2019-08-16 LAB — COMPREHENSIVE METABOLIC PANEL
ALT: 22 U/L (ref 0–44)
AST: 33 U/L (ref 15–41)
Albumin: 4.4 g/dL (ref 3.5–5.0)
Alkaline Phosphatase: 58 U/L (ref 38–126)
Anion gap: 11 (ref 5–15)
BUN: 17 mg/dL (ref 6–20)
CO2: 25 mmol/L (ref 22–32)
Calcium: 9.8 mg/dL (ref 8.9–10.3)
Chloride: 102 mmol/L (ref 98–111)
Creatinine, Ser: 0.98 mg/dL (ref 0.61–1.24)
GFR calc Af Amer: >60 mL/min (ref >60)
GFR calc non Af Amer: >60 mL/min (ref >60)
Glucose, Bld: 76 mg/dL (ref 70–99)
Potassium: 4.8 mmol/L (ref 3.5–5.1)
Sodium: 138 mmol/L (ref 135–145)
Total Bilirubin: 1.3 mg/dL — ABNORMAL HIGH (ref 0.3–1.2)
Total Protein: 7.9 g/dL (ref 6.5–8.1)

## 2019-08-16 LAB — ACETAMINOPHEN LEVEL: Acetaminophen (Tylenol), Serum: <10 ug/mL — ABNORMAL LOW (ref 10–30)

## 2019-08-16 LAB — SALICYLATE LEVEL: Salicylate Lvl: <7 mg/dL — ABNORMAL LOW (ref 7.0–30.0)

## 2019-08-16 LAB — RESPIRATORY PANEL BY RT PCR (FLU A&B, COVID)
Influenza A by PCR: NEGATIVE
Influenza B by PCR: NEGATIVE
SARS Coronavirus 2 by RT PCR: NEGATIVE

## 2019-08-16 LAB — ETHANOL: Alcohol, Ethyl (B): <10 mg/dL (ref <10)

## 2019-08-16 NOTE — ED Notes (Signed)
Pt brought into ED BHU via sally port and wand with metal detector for safety by [place] Security officer. Patient oriented to unit/care area: Pt informed of unit policies and procedures.  Informed that, for their safety, care areas are designed for safety and monitored by security cameras at all times. Patient verbalizes understanding, and verbal contract for safety obtained.Pt shown to their room.  

## 2019-08-16 NOTE — BH Assessment (Signed)
PATIENT BED AVAILABLE 08/17/19 ANY TIME  Patient has been accepted to [facility].  Patient assigned to [facility] Accepting physician is Dr. Roselyn Reef.  Call report to [phone].  Representative was [place].   ER Staff is aware of it:  Georganna Skeans ER Secretary  Dr. Roxan Hockey, ER MD  Dewayne Hatch Patient's Nurse     Address: [address], [place] [address] Patient will need to check in at the [facility] Building for COVID screening

## 2019-08-16 NOTE — ED Triage Notes (Signed)
Pt comes in IVC with [facility]. Pt has bed at  [facility] pending medical clearance and ride there tomorrow with officers. Pt was seen through [facility] today and endorsed SI with a knife. Recently lost his sobriety and began using meth again. Calm and cooperative at this time.
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
## 2019-08-16 NOTE — ED Notes (Signed)
Pt belongings include gray hat, white phone charger, orange lighter, one wallet, deodorant, one tshirt, one pair white pants, two black boots, two white socks, one pair underwear. Belongings bag 2/2.
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
## 2019-08-17 NOTE — ED Notes (Signed)
Attempted to call report. No answer.

## 2019-08-17 NOTE — ED Notes (Signed)
Attempted to call report. Phone was answered and this writer's call was transferred.  Phone rang and was never answered.

## 2019-08-17 NOTE — ED Notes (Signed)
Pt discharged to [facility] under IVC.  VS stable. Pt cooperative. Belongings given to officer.

## 2019-08-17 NOTE — ED Notes (Signed)
4th attempt made to call report.  No answer.

## 2019-08-17 NOTE — ED Notes (Signed)
RN made 2nd attempt to call report. No answer.

## 2019-08-17 NOTE — ED Provider Notes (Signed)
Emergency Medicine Observation Re-evaluation Note  Nathan Koch is a 29 y.o. male, seen on rounds today.  Pt initially presented to the ED for complaints of Psychiatric Evaluation Currently, the patient is resting.  Physical Exam  BP 102/66 (BP Location: Right Arm)   Pulse 72   Temp (!) 97.3 F (36.3 C) (Oral)   Resp 18   Ht 5\' 9"  (1.753 m)   Wt 63.5 kg   SpO2 100%   BMI 20.67 kg/m    ED Course / MDM  Patient has been accepted to [facility] today.  Plan   Patient has been accepted to [facility] today. Patient is under full IVC at this time.   [facility] MD 08/17/19 [phone]

## 2019-09-01 NOTE — Congregational Nurse Program (Signed)
Meet with Client at [employer]. He states he was seen at [facility] in the past 2 weeks and will continue to see them. His next appointment is May 10th with [facility]. He states he was referred by them to [facility] in [place]. He has a history of asthma and does have an albuterol inhaler. Education reviewed about his asthma and inhaler use. [facility] for his asthma. Assisted with application process and will notify [facility]. Bus pass given for his appointment at [facility] on May 10th.

## 2019-09-13 ENCOUNTER — Other Ambulatory Visit: Payer: Self-pay

## 2019-09-13 ENCOUNTER — Emergency Department
Admission: EM | Admit: 2019-09-13 | Discharge: 2019-09-14 | Disposition: A | Discharge status: Other Acute Inpatient Hospital | Payer: Self-pay | Attending: Emergency Medicine | Admitting: Emergency Medicine

## 2019-09-13 DIAGNOSIS — F1520 Other stimulant dependence, uncomplicated: Secondary | ICD-10-CM | POA: Diagnosis present

## 2019-09-13 DIAGNOSIS — F17210 Nicotine dependence, cigarettes, uncomplicated: Secondary | ICD-10-CM | POA: Insufficient documentation

## 2019-09-13 DIAGNOSIS — F1510 Other stimulant abuse, uncomplicated: Secondary | ICD-10-CM | POA: Insufficient documentation

## 2019-09-13 DIAGNOSIS — R45851 Suicidal ideations: Secondary | ICD-10-CM | POA: Insufficient documentation

## 2019-09-13 DIAGNOSIS — F333 Major depressive disorder, recurrent, severe with psychotic symptoms: Secondary | ICD-10-CM | POA: Diagnosis present

## 2019-09-13 DIAGNOSIS — F32A Depression, unspecified: Secondary | ICD-10-CM

## 2019-09-13 DIAGNOSIS — Z20822 Contact with and (suspected) exposure to covid-19: Secondary | ICD-10-CM | POA: Insufficient documentation

## 2019-09-13 HISTORY — DX: Other stimulant abuse, uncomplicated: F15.10

## 2019-09-13 LAB — CBC
HCT: 41 % (ref 39.0–52.0)
Hemoglobin: 14.1 g/dL (ref 13.0–17.0)
MCH: 29.1 pg (ref 26.0–34.0)
MCHC: 34.4 g/dL (ref 30.0–36.0)
MCV: 84.5 fL (ref 80.0–100.0)
Platelets: 374 10*3/uL (ref 150–400)
RBC: 4.85 MIL/uL (ref 4.22–5.81)
RDW: 13.5 % (ref 11.5–15.5)
WBC: 10.6 10*3/uL — ABNORMAL HIGH (ref 4.0–10.5)
nRBC: 0 % (ref 0.0–0.2)

## 2019-09-13 LAB — COMPREHENSIVE METABOLIC PANEL
ALT: 17 U/L (ref 0–44)
AST: 25 U/L (ref 15–41)
Albumin: 5 g/dL (ref 3.5–5.0)
Alkaline Phosphatase: 62 U/L (ref 38–126)
Anion gap: 13 (ref 5–15)
BUN: 15 mg/dL (ref 6–20)
CO2: 24 mmol/L (ref 22–32)
Calcium: 9.8 mg/dL (ref 8.9–10.3)
Chloride: 105 mmol/L (ref 98–111)
Creatinine, Ser: 1.04 mg/dL (ref 0.61–1.24)
GFR calc Af Amer: >60 mL/min (ref >60)
GFR calc non Af Amer: >60 mL/min (ref >60)
Glucose, Bld: 89 mg/dL (ref 70–99)
Potassium: 4.3 mmol/L (ref 3.5–5.1)
Sodium: 142 mmol/L (ref 135–145)
Total Bilirubin: 2.3 mg/dL — ABNORMAL HIGH (ref 0.3–1.2)
Total Protein: 8.4 g/dL — ABNORMAL HIGH (ref 6.5–8.1)

## 2019-09-13 LAB — ETHANOL: Alcohol, Ethyl (B): <10 mg/dL (ref <10)

## 2019-09-13 NOTE — ED Notes (Signed)
Pt. Alert and oriented, warm and dry, in no distress. Pt. Denies HI, and AVH. Pt states having some SI thought with cutting. Patient contracts for safety with this Clinical research associate. Patient states he has been having a lot of stress between his fiance and his job situation. Pt. Encouraged to let nursing staff know of any concerns or needs.

## 2019-09-13 NOTE — ED Notes (Signed)

## 2019-09-13 NOTE — ED Triage Notes (Signed)
Pt has been having problems with girlfriend and is now not able to see his daughter. States has been depressed and feels suicidal at times. Pt is on meth and also wants rehab.

## 2019-09-14 ENCOUNTER — Encounter: Payer: Self-pay | Admitting: Emergency Medicine

## 2019-09-14 ENCOUNTER — Ambulatory Visit: Payer: Self-pay | Admitting: Gerontology

## 2019-09-14 ENCOUNTER — Inpatient Hospital Stay
Admit: 2019-09-14 | Discharge: 2019-09-22 | DRG: 897 | Disposition: A | Discharge status: Home / Self Care | Payer: No Typology Code available for payment source | Source: Ambulatory Visit | Attending: Psychiatry | Admitting: Psychiatry

## 2019-09-14 ENCOUNTER — Encounter: Payer: Self-pay | Admitting: Psychiatry

## 2019-09-14 DIAGNOSIS — Z639 Problem related to primary support group, unspecified: Secondary | ICD-10-CM | POA: Diagnosis not present

## 2019-09-14 DIAGNOSIS — F1520 Other stimulant dependence, uncomplicated: Secondary | ICD-10-CM | POA: Diagnosis present

## 2019-09-14 DIAGNOSIS — F17210 Nicotine dependence, cigarettes, uncomplicated: Secondary | ICD-10-CM | POA: Diagnosis present

## 2019-09-14 DIAGNOSIS — Z20822 Contact with and (suspected) exposure to covid-19: Secondary | ICD-10-CM | POA: Diagnosis present

## 2019-09-14 DIAGNOSIS — F411 Generalized anxiety disorder: Secondary | ICD-10-CM | POA: Diagnosis present

## 2019-09-14 DIAGNOSIS — J45909 Unspecified asthma, uncomplicated: Secondary | ICD-10-CM | POA: Diagnosis present

## 2019-09-14 DIAGNOSIS — F323 Major depressive disorder, single episode, severe with psychotic features: Secondary | ICD-10-CM | POA: Diagnosis present

## 2019-09-14 DIAGNOSIS — Z590 Homelessness: Secondary | ICD-10-CM

## 2019-09-14 DIAGNOSIS — R45851 Suicidal ideations: Secondary | ICD-10-CM | POA: Diagnosis present

## 2019-09-14 DIAGNOSIS — Z825 Family history of asthma and other chronic lower respiratory diseases: Secondary | ICD-10-CM

## 2019-09-14 DIAGNOSIS — F333 Major depressive disorder, recurrent, severe with psychotic symptoms: Secondary | ICD-10-CM | POA: Diagnosis present

## 2019-09-14 DIAGNOSIS — Z599 Problem related to housing and economic circumstances, unspecified: Secondary | ICD-10-CM

## 2019-09-14 DIAGNOSIS — F1510 Other stimulant abuse, uncomplicated: Principal | ICD-10-CM

## 2019-09-14 LAB — SARS CORONAVIRUS 2 BY RT PCR (HOSPITAL ORDER, PERFORMED IN ~~LOC~~ HOSPITAL LAB): SARS Coronavirus 2: NEGATIVE

## 2019-09-14 LAB — SALICYLATE LEVEL: Salicylate Lvl: <7 mg/dL — ABNORMAL LOW (ref 7.0–30.0)

## 2019-09-14 LAB — ACETAMINOPHEN LEVEL: Acetaminophen (Tylenol), Serum: <10 ug/mL — ABNORMAL LOW (ref 10–30)

## 2019-09-14 MED ORDER — LORAZEPAM 1 MG PO TABS
1.0000 mg | ORAL_TABLET | Freq: Once | ORAL | Status: AC
  Administered 2019-09-14: 1 mg via ORAL
  Filled 2019-09-14: qty 1

## 2019-09-14 MED ORDER — CITALOPRAM HYDROBROMIDE 20 MG PO TABS
20.0000 mg | ORAL_TABLET | Freq: Every day | ORAL | Status: DC
  Administered 2019-09-14 – 2019-09-17 (×4): 20 mg via ORAL
  Filled 2019-09-14 (×5): qty 1

## 2019-09-14 MED ORDER — ACETAMINOPHEN 325 MG PO TABS: 650.0000 mg | ORAL_TABLET | Freq: Four times a day (QID) | ORAL | Status: DC | PRN

## 2019-09-14 MED ORDER — ALUM & MAG HYDROXIDE-SIMETH 200-200-20 MG/5ML PO SUSP: 30.0000 mL | ORAL | Status: DC | PRN

## 2019-09-14 MED ORDER — MAGNESIUM HYDROXIDE 400 MG/5ML PO SUSP: 30.0000 mL | Freq: Every day | ORAL | Status: DC | PRN

## 2019-09-14 NOTE — Tx Team (Addendum)
Interdisciplinary Treatment and Diagnostic Plan Update  09/14/2019 Time of Session: 9:00AM Nathan Koch MRN: [id number]  Principal Diagnosis: <principal problem not specified>  Secondary Diagnoses: Active Problems:   * No active hospital problems. *   Current Medications:  No current facility-administered medications for this encounter.   PTA Medications: Medications Prior to Admission  Medication Sig Dispense Refill Last Dose  . albuterol (PROVENTIL HFA;VENTOLIN HFA) 108 (90 Base) MCG/ACT inhaler Inhale 2 puffs into the lungs every 4 (four) hours as needed for wheezing or shortness of breath. 1 Inhaler 2   . cyclobenzaprine (FLEXERIL) 5 MG tablet Take 1 tablet (5 mg total) by mouth 3 (three) times daily as needed for muscle spasms. 15 tablet 0   . Fluticasone-Salmeterol (ADVAIR DISKUS) 250-50 MCG/DOSE AEPB Inhale 1 puff into the lungs 2 (two) times daily. 60 each 2   . lidocaine (XYLOCAINE) 2 % solution Use as directed 15 mLs in the mouth or throat as needed for mouth pain. 100 mL 0   . ondansetron (ZOFRAN ODT) 4 MG disintegrating tablet Take 1 tablet (4 mg total) by mouth every 8 (eight) hours as needed for nausea or vomiting. 6 tablet 0     Patient Stressors:    Patient Strengths:    Treatment Modalities: Medication Management, Group therapy, Case management,  1 to 1 session with clinician, Psychoeducation, Recreational therapy.   Physician Treatment Plan for Primary Diagnosis: <principal problem not specified> Long Term Goal(s):     Short Term Goals:    Medication Management: Evaluate patient's response, side effects, and tolerance of medication regimen.  Therapeutic Interventions: 1 to 1 sessions, Unit Group sessions and Medication administration.  Evaluation of Outcomes: Not Met  Physician Treatment Plan for Secondary Diagnosis: Active Problems:   * No active hospital problems. *  Long Term Goal(s):     Short Term Goals:       Medication Management: Evaluate  patient's response, side effects, and tolerance of medication regimen.  Therapeutic Interventions: 1 to 1 sessions, Unit Group sessions and Medication administration.  Evaluation of Outcomes: Not Met   RN Treatment Plan for Primary Diagnosis: <principal problem not specified> Long Term Goal(s): Knowledge of disease and therapeutic regimen to maintain health will improve  Short Term Goals: Ability to demonstrate self-control, Ability to verbalize feelings will improve, Ability to identify and develop effective coping behaviors will improve and Compliance with prescribed medications will improve  Medication Management: RN will administer medications as ordered by provider, will assess and evaluate patient's response and provide education to patient for prescribed medication. RN will report any adverse and/or side effects to prescribing provider.  Therapeutic Interventions: 1 on 1 counseling sessions, Psychoeducation, Medication administration, Evaluate responses to treatment, Monitor vital signs and CBGs as ordered, Perform/monitor CIWA, COWS, AIMS and Fall Risk screenings as ordered, Perform wound care treatments as ordered.  Evaluation of Outcomes: Not Met   LCSW Treatment Plan for Primary Diagnosis: <principal problem not specified> Long Term Goal(s): Safe transition to appropriate next level of care at discharge, Engage patient in therapeutic group addressing interpersonal concerns.  Short Term Goals: Engage patient in aftercare planning with referrals and resources, Increase social support, Increase ability to appropriately verbalize feelings, Increase emotional regulation, Facilitate acceptance of mental health diagnosis and concerns, Facilitate patient progression through stages of change regarding substance use diagnoses and concerns and Identify triggers associated with mental health/substance abuse issues  Therapeutic Interventions: Assess for all discharge needs, 1 to 1 time with  Social worker, Explore available  resources and support systems, Assess for adequacy in community support network, Educate family and significant other(s) on suicide prevention, Complete Psychosocial Assessment, Interpersonal group therapy.  Evaluation of Outcomes: Not Met   Progress in Treatment: Attending groups: No. Participating in groups: No. Taking medication as prescribed: Yes. Toleration medication: Yes. Family/Significant other contact made: Yes, individual(s) contacted:  once permission is given. Patient understands diagnosis: Yes. Discussing patient identified problems/goals with staff: Yes. Medical problems stabilized or resolved: Yes. Denies suicidal/homicidal ideation: Yes. Issues/concerns per patient self-inventory: No. Other: none  New problem(s) identified: No, Describe:  none  New Short Term/Long Term Goal(s): detox, medication management for mood stabilization; elimination of SI thoughts; development of comprehensive mental wellness/sobriety plan.  Patient Goals:  "finding long-term treatment"  Discharge Plan or Barriers:  Patient reports plans to seek inpatient treatment.   Reason for Continuation of Hospitalization: Depression Medication stabilization Suicidal ideation Withdrawal symptoms  Estimated Length of Stay:  1-7 days  Recreational Therapy: Patient Stressors: N/A Patient Goal: Patient will engage in groups without prompting or encouragement from LRT x3 group sessions within 5 recreation therapy group sessions  Attendees: Patient: Nathan Koch 09/14/2019 11:34 AM  Physician: Dr. Weber Cooks, MD 09/14/2019 11:34 AM  Nursing: Collier Bullock, RN 09/14/2019 11:34 AM  RN Care Manager: 09/14/2019 11:34 AM  Social Worker: Assunta Curtis, LCSW 09/14/2019 11:34 AM  Recreational Therapist:  09/14/2019 11:34 AM  Other: Sanjuana Kava, LCSW 09/14/2019 11:34 AM  Other:  09/14/2019 11:34 AM  Other: 09/14/2019 11:34 AM    Scribe for Treatment Team: Rozann Lesches, LCSW 09/14/2019 11:34 AM

## 2019-09-14 NOTE — ED Notes (Signed)
Brown boots Cell Phones Blue tshirt [employer].  2 silver cross necklaces Locked up in bhu locker area
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
## 2019-09-14 NOTE — BHH Group Notes (Signed)
LCSW Group Therapy Note  09/14/2019 1:00 PM  Type of Therapy/Topic:  Group Therapy:  Balance in Life  Participation Level:  Did Not Attend  Description of Group:    This group will address the concept of balance and how it feels and looks when one is unbalanced. Patients will be encouraged to process areas in their lives that are out of balance and identify reasons for remaining unbalanced. Facilitators will guide patients in utilizing problem-solving interventions to address and correct the stressor making their life unbalanced. Understanding and applying boundaries will be explored and addressed for obtaining and maintaining a balanced life. Patients will be encouraged to explore ways to assertively make their unbalanced needs known to significant others in their lives, using other group members and facilitator for support and feedback.  Therapeutic Goals: 1. Patient will identify two or more emotions or situations they have that consume much of in their lives. 2. Patient will identify signs/triggers that life has become out of balance:  3. Patient will identify two ways to set boundaries in order to achieve balance in their lives:  4. Patient will demonstrate ability to communicate their needs through discussion and/or role plays  Summary of Patient Progress: X  Therapeutic Modalities:   Cognitive Behavioral Therapy Solution-Focused Therapy Assertiveness Training  Mitali Shenefield MSW, LCSW 09/14/2019 12:50 PM 

## 2019-09-14 NOTE — [facility] Suicide Risk Assessment (Signed)
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  Patient attended treatment team.  29 year old man with a history of methamphetamine abuse and mood symptoms.  States that he relapsed into methamphetamine use although also admits that he has been using regularly many times a week.  Mood became depressed and he had suicidal thoughts but did not act on it.  Continues to say he has some suicidal thoughts but has no intention or plan at this point.  No evidence of psychosis.  Continued Clinical Symptoms:  Alcohol Use Disorder Identification Test Final Score (AUDIT): 0 The "Alcohol Use Disorders Identification Test", Guidelines for Use in Primary Care, Second Edition.  World Pharmacologist [facility]). Score between 0-7:  no or low risk or alcohol related problems. Score between 8-15:  moderate risk of alcohol related problems. Score between 16-19:  high risk of alcohol related problems. Score 20 or above:  warrants further diagnostic evaluation for alcohol dependence and treatment.   CLINICAL FACTORS:   Depression:   Comorbid alcohol abuse/dependence Alcohol/Substance Abuse/Dependencies   Musculoskeletal: Strength & Muscle Tone: within normal limits Gait & Station: normal Patient leans: N/A  Psychiatric Specialty Exam: Physical Exam   Nursing note and vitals reviewed. Constitutional: He appears well-developed and well-nourished.  HENT:  Head: Normocephalic and atraumatic.  Eyes: Pupils are equal, round, and reactive to light. Conjunctivae are normal.  Cardiovascular: Regular rhythm and normal heart sounds.  Respiratory: Effort normal. No respiratory distress.  GI: Soft.  Musculoskeletal:        General: Normal range of motion.     Cervical back: Normal range of motion.  Neurological: He is alert.  Skin: Skin is warm and dry.  Psychiatric: His speech is normal and behavior is normal. Judgment and thought content normal. His mood appears anxious. Cognition and memory are normal.    Review of Systems  Constitutional: Negative.   HENT: Negative.   Eyes: Negative.   Respiratory: Negative.   Cardiovascular: Negative.   Gastrointestinal: Negative.   Musculoskeletal: Negative.   Skin: Negative.   Neurological: Negative.   Psychiatric/Behavioral: Positive for dysphoric mood and suicidal ideas.    Blood pressure 123/75, pulse (!) 103, temperature 97.8 F (36.6 C), temperature source Oral, resp. rate 18, height 5\' 9"  (1.753 m), weight 82.1 kg, SpO2 99 %.Body mass index is 26.73 kg/m.  General Appearance: Casual  Eye Contact:  Fair  Speech:  Clear and Coherent  Volume:  Normal  Mood:  Dysphoric  Affect:  Congruent  Thought Process:  Goal Directed  Orientation:  Full (Time, Place, and Person)  Thought Content:  Logical  Suicidal Thoughts:  Yes.  without intent/plan  Homicidal Thoughts:  No  Memory:  Immediate;   Fair Recent;   Poor Remote;   Fair  Judgement:  Impaired  Insight:  Shallow  Psychomotor Activity:  Decreased  Concentration:  Concentration: Fair  Recall:  [employer] of Knowledge:  Fair  Language:  Fair  Akathisia:  No  Handed:  Right  AIMS (if indicated):     Assets:  Desire for Improvement  ADL's:  Impaired  Cognition:  Impaired,  Mild  Sleep:  Number of Hours: 0      COGNITIVE  FEATURES THAT CONTRIBUTE TO RISK:  None    SUICIDE RISK:   Minimal: No identifiable suicidal ideation.  Patients presenting with no risk factors but with morbid ruminations; may be classified as minimal risk based on the severity of the depressive symptoms  PLAN OF CARE: Continue 15-minute checks.  Restart antidepressant medicine.  Engage in individual and group therapy.  Offer outpatient options for substance abuse treatment.  Reassess dangerousness prior to discharge  I certify that inpatient services furnished can reasonably be expected to improve the patient's condition.   Mordecai Rasmussen, MD 09/14/2019, 4:22 PM

## 2019-09-14 NOTE — Progress Notes (Signed)
Pt has been withdrawn in his room sleeping except for meals. Torrie Mayers RN

## 2019-09-14 NOTE — Progress Notes (Signed)
The patient is a 29 year old male. He has been admitted to the BMU related to agitation and depression secondary to substance use (methamphetamine). The patient stated that he had an argument with the mother of his child and he was suppose to start working a new job today. All of these factors caused him to relapse after being clean for 8 months. He began using meth 2 years ago. During the admission process the patient revealed that he has a previous history of self-harm (5 months ago). The patient is currently homeless and is planning to be discharged to a shelter in [place].  The skin assessment was unremarkable. Two tattoos noted: 1. Right wrist 2. Left ring finger. The patient's feet had multiple callous on them and were very unclean. He stated that he walked from [place] to [place] to visit his daughter. The patient was very tearful during the admission, he also was observed pulling his hair as a nervous coping method.   The patient is willing to attend a treatment facility for his substance addiction. He was given a tour of the unit, explained the rules of the unit and signed all of the necessary forms for admission. We will continue to monitor for safety and provide support.   Tyress Loden M Donyell Carrell, RN  

## 2019-09-14 NOTE — Plan of Care (Signed)
The patient is a 29 year old male. He has been admitted to the BMU related to agitation and depression secondary to substance use (methamphetamine). The patient stated that he had an argument with the mother of his child and he was suppose to start working a new job today. All of these factors caused him to relapse after being clean for 8 months. He began using meth 2 years ago. During the admission process the patient revealed that he has a previous history of self-harm (5 months ago). The patient is currently homeless and is planning to be discharged to a shelter in [place].  The skin assessment was unremarkable. Two tattoos noted: 1. Right wrist 2. Left ring finger. The patient's feet had multiple callous on them and were very unclean. He stated that he walked from [place] to [place] to visit his daughter. The patient was very tearful during the admission, he also was observed pulling his hair as a nervous coping method.   The patient is willing to attend a treatment facility for his substance addiction. He was given a tour of the unit, explained the rules of the unit and signed all of the necessary forms for admission. We will continue to monitor for safety and provide support.   Angelina Sheriff, RN

## 2019-09-14 NOTE — H&P (Signed)
Psychiatric Admission Assessment Adult  Patient Identification: Nathan Koch MRN:  [id number] Date of Evaluation:  09/14/2019 Chief Complaint:  MDD (major depressive disorder), single episode, severe with psychosis ([name]) [F32.3] Principal Diagnosis: Methamphetamine use ([name]) Diagnosis:  Principal Problem:   Methamphetamine use ([name]) Active Problems:   MDD (major depressive disorder), single episode, severe with psychosis ([name])  History of Present Illness: Patient was seen today and also attended treatment team.  29 year old man came to the emergency room last night reporting suicidal thoughts.  He tells me that he was found by the police [place] out [place] side of [place] but that information is not in the emergency room chart which instead describes him as coming in voluntarily.  Patient says he and his girlfriend who both use methamphetamine regularly had an argument last night.  He ended up using an entire $60 share of meth at once which he says he does not normally do.  Patient says his mood recently has been down and depressed.  Vaguely describes some suicidal thoughts.  Does not present any current intent.  Denies hallucinations.  Sleep is chaotic.  Lifestyle chaotic.  Not working.  Not clear if he will have a place to live.  He has not been following up with recommended outpatient mental health or substance abuse treatment.  Using methamphetamine several times a week.  Denies alcohol or other drugs Associated Signs/Symptoms: Depression Symptoms:  depressed mood, anhedonia, suicidal thoughts without plan, anxiety, (Hypo) Manic Symptoms:  Impulsivity, Anxiety Symptoms:  Excessive Worry, Psychotic Symptoms:  Paranoia, PTSD Symptoms: Negative Total Time spent with patient: 1 hour  Past Psychiatric History: Patient has had multiple emergency room presentations and referrals to other hospitals for substance abuse and mood disorder.  Usually the same circumstances what he has now.  Does  not appear to have ever had any inpatient rehab or a longer term substance abuse treatment.  He says he has been on medicines in the past and can name Zoloft Seroquel and Ativan.  He says he does not take them because he cannot afford them but it sounds like he does not follow-up with any treatment either.  He says he has cut himself in the past but no other suicide attempts.  Is the patient at risk to self? Yes.    Has the patient been a risk to self in the past 6 months? Yes.    Has the patient been a risk to self within the distant past? Yes.    Is the patient a risk to others? No.  Has the patient been a risk to others in the past 6 months? No.  Has the patient been a risk to others within the distant past? No.   Prior Inpatient Therapy:   Prior Outpatient Therapy:    Alcohol Screening: 1. How often do you have a drink containing alcohol?: Never 2. How many drinks containing alcohol do you have on a typical day when you are drinking?: 1 or 2 3. How often do you have six or more drinks on one occasion?: Never AUDIT-C Score: 0 4. How often during the last year have you found that you were not able to stop drinking once you had started?: Never 5. How often during the last year have you failed to do what was normally expected from you because of drinking?: Never 6. How often during the last year have you needed a first drink in the morning to get yourself going after a heavy drinking session?: Never  7. How often during the last year have you had a feeling of guilt of remorse after drinking?: Never 8. How often during the last year have you been unable to remember what happened the night before because you had been drinking?: Never 9. Have you or someone else been injured as a result of your drinking?: No 10. Has a relative or friend or a doctor or another health worker been concerned about your drinking or suggested you cut down?: No Alcohol Use Disorder Identification Test Final Score  (AUDIT): 0 Alcohol Brief Interventions/Follow-up: AUDIT Score <7 follow-up not indicated Substance Abuse History in the last 12 months:  Yes.   Consequences of Substance Abuse: Severe lifestyle problems elevated liver enzymes poor functioning depression Previous Psychotropic Medications: Yes  Psychological Evaluations: Yes  Past Medical History:  Past Medical History:  Diagnosis Date  . Anxiety   . Asthma   . Depression   . Methamphetamine abuse [facility]([name])     Past Surgical History:  Procedure Laterality Date  . CLOSED REDUCTION MANDIBLE N/A 07/02/2018   Procedure: CLOSED REDUCTION MANDIBULAR;  Surgeon: Vernie MurdersJuengel, Paul, MD;  Location: [facility] ORS;  Service: ENT;  Laterality: N/A;   Family History:  Family History  Problem Relation Age of Onset  . Asthma Mother   . Asthma Brother    Family Psychiatric  History: Denies any Tobacco Screening:   Social History:  Social History   Substance and Sexual Activity  Alcohol Use Not Currently  . Alcohol/week: 7.0 standard drinks  . Types: 7 Standard drinks or equivalent per week     Social History   Substance and Sexual Activity  Drug Use Yes  . Types: Methamphetamines    Additional Social History: Marital status: Married Number of Years Married: 1 What types of issues is patient dealing with in the relationship?: "Just arguing" What is your sexual orientation?: Heterosexual Does patient have children?: Yes How many children?: 1 How is patient's relationship with their children?: "it's great"                         Allergies:   Allergies  Allergen Reactions  . Ibuprofen Swelling   Lab Results:  Results for orders placed or performed during the hospital encounter of 09/13/19 (from the past 48 hour(s))  CBC     Status: Abnormal   Collection Time: 09/13/19 10:26 PM  Result Value Ref Range   WBC 10.6 (H) 4.0 - 10.5 K/uL   RBC 4.85 4.22 - 5.81 MIL/uL   Hemoglobin 14.1 13.0 - 17.0 g/dL   HCT 16.141.0 09.639.0 - 04.552.0 %   MCV  84.5 80.0 - 100.0 fL   MCH 29.1 26.0 - 34.0 pg   MCHC 34.4 30.0 - 36.0 g/dL   RDW 40.913.5 81.111.5 - 91.415.5 %   Platelets 374 150 - 400 K/uL   nRBC 0.0 0.0 - 0.2 %    Comment: Performed at [facility], [address] [place], [place] [address]  Comprehensive metabolic panel     Status: Abnormal   Collection Time: 09/13/19 10:26 PM  Result Value Ref Range   Sodium 142 135 - 145 mmol/L   Potassium 4.3 3.5 - 5.1 mmol/L   Chloride 105 98 - 111 mmol/L   CO2 24 22 - 32 mmol/L   Glucose, Bld 89 70 - 99 mg/dL    Comment: Glucose reference range applies only to samples taken after fasting for at least 8 hours.   BUN 15  6 - 20 mg/dL   Creatinine, Ser 3.78 0.61 - 1.24 mg/dL   Calcium 9.8 8.9 - 58.8 mg/dL   Total Protein 8.4 (H) 6.5 - 8.1 g/dL   Albumin 5.0 3.5 - 5.0 g/dL   AST 25 15 - 41 U/L   ALT 17 0 - 44 U/L   Alkaline Phosphatase 62 38 - 126 U/L   Total Bilirubin 2.3 (H) 0.3 - 1.2 mg/dL   GFR calc non Af Amer >60 >60 mL/min   GFR calc Af Amer >60 >60 mL/min   Anion gap 13 5 - 15    Comment: Performed at [facility], [address] [place], [place] [address]  Ethanol     Status: None   Collection Time: 09/13/19 10:26 PM  Result Value Ref Range   Alcohol, Ethyl (B) <10 <10 mg/dL    Comment: (NOTE) Lowest detectable limit for serum alcohol is 10 mg/dL. For medical purposes only. Performed at [facility], [address] [place], [place] [address]   Salicylate level     Status: Abnormal   Collection Time: 09/13/19 10:26 PM  Result Value Ref Range   Salicylate Lvl <7.0 (L) 7.0 - 30.0 mg/dL    Comment: Performed at [facility], [address] [place], [place] [address]  Acetaminophen level     Status: Abnormal   Collection Time: 09/13/19 10:26 PM  Result Value Ref Range   Acetaminophen (Tylenol), Serum <10 (L) 10 - 30 ug/mL    Comment: (NOTE) Therapeutic concentrations vary significantly. A range of 10-30 ug/mL  may be an effective  concentration for many patients. However, some  are best treated at concentrations outside of this range. Acetaminophen concentrations >150 ug/mL at 4 hours after ingestion  and >50 ug/mL at 12 hours after ingestion are often associated with  toxic reactions. Performed at [facility], [address] [place], [place] [address]   SARS Coronavirus 2 by RT PCR (hospital order, performed in [facility] hospital lab) Nasopharyngeal Nasopharyngeal Swab     Status: None   Collection Time: 09/14/19 12:07 AM   Specimen: Nasopharyngeal Swab  Result Value Ref Range   SARS Coronavirus 2 NEGATIVE NEGATIVE    Comment: (NOTE) SARS-CoV-2 target nucleic acids are NOT DETECTED. The SARS-CoV-2 RNA is generally detectable in upper and lower respiratory specimens during the acute phase of infection. The lowest concentration of SARS-CoV-2 viral copies this assay can detect is 250 copies / mL. A negative result does not preclude SARS-CoV-2 infection and should not be used as the sole basis for treatment or other patient management decisions.  A negative result may occur with improper specimen collection / handling, submission of specimen other than nasopharyngeal swab, presence of viral mutation(s) within the areas targeted by this assay, and inadequate number of viral copies (<250 copies / mL). A negative result must be combined with clinical observations, patient history, and epidemiological information. Fact Sheet for Patients:   BoilerBrush.com.cy Fact Sheet for Healthcare Providers: https://pope.com/ This test is not yet approved or cleared  by the [place] FDA and has been authorized for detection and/or diagnosis of SARS-CoV-2 by FDA under an Emergency Use Authorization (EUA).  This EUA will remain in effect (meaning this test can be used) for the duration of the COVID-19 declaration under Section 564(b)(1) of the Act, 21  U.S.C. section 360bbb-3(b)(1), unless the authorization is terminated or revoked sooner. Performed at [facility], [address], [place], [place] [address]  Blood Alcohol level:  Lab Results  Component Value Date   ETH <10 09/13/2019   ETH <10 32/67/1245    Metabolic Disorder Labs:  No results found for: HGBA1C, MPG No results found for: PROLACTIN Lab Results  Component Value Date   CHOL 156 07/11/2014   TRIG 178 (A) 07/11/2014   HDL 46 07/11/2014   LDLCALC 74 07/11/2014    Current Medications: Current Facility-Administered Medications  Medication Dose Route Frequency Provider Last Rate Last Admin  . acetaminophen (TYLENOL) tablet 650 mg  650 mg Oral Q6H PRN [name], NP      . alum & mag hydroxide-simeth (MAALOX/MYLANTA) 200-200-20 MG/5ML suspension 30 mL  30 mL Oral Q4H PRN [name], NP      . citalopram (CELEXA) tablet 20 mg  20 mg Oral Daily Lamia Mariner T, MD      . magnesium hydroxide (MILK OF MAGNESIA) suspension 30 mL  30 mL Oral Daily PRN [name], NP       PTA Medications: Medications Prior to Admission  Medication Sig Dispense Refill Last Dose  . albuterol (PROVENTIL HFA;VENTOLIN HFA) 108 (90 Base) MCG/ACT inhaler Inhale 2 puffs into the lungs every 4 (four) hours as needed for wheezing or shortness of breath. 1 Inhaler 2   . cyclobenzaprine (FLEXERIL) 5 MG tablet Take 1 tablet (5 mg total) by mouth 3 (three) times daily as needed for muscle spasms. 15 tablet 0   . Fluticasone-Salmeterol (ADVAIR DISKUS) 250-50 MCG/DOSE AEPB Inhale 1 puff into the lungs 2 (two) times daily. 60 each 2   . lidocaine (XYLOCAINE) 2 % solution Use as directed 15 mLs in the mouth or throat as needed for mouth pain. 100 mL 0   . ondansetron (ZOFRAN ODT) 4 MG disintegrating tablet Take 1 tablet (4 mg total) by mouth every 8 (eight) hours as needed for nausea or vomiting. 6 tablet 0     Musculoskeletal: Strength & Muscle Tone: within  normal limits Gait & Station: normal Patient leans: N/A  Psychiatric Specialty Exam: Physical Exam  Nursing note and vitals reviewed. Constitutional: He appears well-developed and well-nourished.  HENT:  Head: Normocephalic and atraumatic.  Eyes: Pupils are equal, round, and reactive to light. Conjunctivae are normal.  Cardiovascular: Regular rhythm and normal heart sounds.  Respiratory: Effort normal.  GI: Soft.  Musculoskeletal:        General: Normal range of motion.     Cervical back: Normal range of motion.  Neurological: He is alert.  Skin: Skin is warm and dry.  Psychiatric: His affect is blunt. His speech is delayed. He is slowed. He expresses impulsivity. He expresses suicidal ideation. He expresses no suicidal plans. He exhibits abnormal recent memory.    Review of Systems  Constitutional: Negative.   HENT: Negative.   Eyes: Negative.   Respiratory: Negative.   Cardiovascular: Negative.   Gastrointestinal: Negative.   Musculoskeletal: Negative.   Skin: Negative.   Neurological: Negative.   Psychiatric/Behavioral: Positive for dysphoric mood and suicidal ideas. The patient is nervous/anxious.     Blood pressure 123/75, pulse (!) 103, temperature 97.8 F (36.6 C), temperature source Oral, resp. rate 18, height 5\' 9"  (1.753 m), weight 82.1 kg, SpO2 99 %.Body mass index is 26.73 kg/m.  General Appearance: Casual  Eye Contact:  Fair  Speech:  Slow  Volume:  Decreased  Mood:  Anxious and Depressed  Affect:  Congruent  Thought Process:  Coherent  Orientation:  Full (Time, Place, and Person)  Thought Content:  Rumination  Suicidal Thoughts:  Yes.  without intent/plan  Homicidal Thoughts:  No  Memory:  Immediate;   Fair Recent;   Poor Remote;   Fair  Judgement:  Impaired  Insight:  Shallow  Psychomotor Activity:  Normal  Concentration:  Concentration: Fair  Recall:  [employer] of Knowledge:  Fair  Language:  Fair  Akathisia:  No  Handed:  Right  AIMS (if  indicated):     Assets:  Desire for Improvement Physical Health  ADL's:  Impaired  Cognition:  Impaired,  Mild  Sleep:  Number of Hours: 0    Treatment Plan Summary: Plan Restart antidepressant using citalopram because of his complaint about cost.  Engage in individual and group therapy and assessment.  He has been offered the opportunity of being referred for inpatient substance abuse treatment but at this point is saying that it is impractical for him.  Reassessed daily with probable length of stay 1 to 2 days.  Observation Level/Precautions:  15 minute checks  Laboratory:  UDS  Psychotherapy:    Medications:    Consultations:    Discharge Concerns:    Estimated LOS:  Other:     Physician Treatment Plan for Primary Diagnosis: Methamphetamine use ([name]) Long Term Goal(s): Improvement in symptoms so as ready for discharge  Short Term Goals: Ability to demonstrate self-control will improve  Physician Treatment Plan for Secondary Diagnosis: Principal Problem:   Methamphetamine use ([name]) Active Problems:   MDD (major depressive disorder), single episode, severe with psychosis ([name])  Long Term Goal(s): Improvement in symptoms so as ready for discharge  Short Term Goals: Compliance with prescribed medications will improve  I certify that inpatient services furnished can reasonably be expected to improve the patient's condition.    Mordecai Rasmussen, MD 5/20/20214:24 PM

## 2019-09-14 NOTE — [facility] Counselor (Signed)
CSW spoke with the patient on housing options.  Patient declined information on [facility].  Patient reports that he is not allowed to return to [facility].  Patient reports plans to go to a shelter in [place].  CSW is unaware of this shelter unless he is referring to [facility], which is a treatment center and not a shelter.    Patient was unable to clarify and CSW will assess further at a later date.    CSW made pt aware of options in [facility] as well as consistent difficulty with assuring bed availability in [place].  Penni Homans, MSW, LCSW 09/14/2019 11:12 AM

## 2019-09-14 NOTE — Consult Note (Signed)
[facility] Face-to-Face Psychiatry Consult   Reason for Consult: Psychiatric Evaluation Referring Physician: Dr. York Cerise Patient Identification: Nathan Koch MRN:  [id number] Principal Diagnosis: MDD (major depressive disorder), recurrent, severe, with psychosis ([name]) Diagnosis:  Principal Problem:   MDD (major depressive disorder), recurrent, severe, with psychosis ([name]) Active Problems:   Methamphetamine use ([name])   Total Time spent with patient: 30 minutes  Subjective: "I am really mad at my fianc for not letting me spend time with my daughter." Nathan Koch is a 29 y.o. male patient presented to [facility] ED via POV voluntary. The patient came in voicing he has been having problems with his girlfriend and cannot see his daughter.  He states he has been depressed and feeling suicidal at times.  During the patient psychiatric evaluation, he presents with increase internal stimuli and auditory hallucinations.  During his assessment, he mumbles to himself, makes gestures with his hands, and looking up and around the room.  He begins to reach as if he is grabbing for something.  He continuously pulls on his hair while making facial gestures.  After the interview, the patient refused to leave the room.  He states, "oh no, I am not going out there."  The patient was told there was someone else who needed to use the room. He then decided to leave the room without incident.  The patient presents with thought blocking, extremely bizarre behaviors, and cannot maintain a complete sentence. The patient was seen face-to-face by this provider; chart reviewed and consulted with Dr. York Cerise on 09/14/2019 due to the patient's care. It was discussed with the EDP that the patient does meet the criteria to be admitted to the psychiatric inpatient unit.  On evaluation, the patient is alert and oriented x 2-3, bizarre, redirectable, cooperative, and mood-congruent with affect.  The patient does appear to be responding to  internal and external stimuli.  The patient is presenting with delusional thinking. The patient denies auditory hallucination but responding to verbal commands from another source other than this Clinical research associate.  The patient denies visual hallucinations. The patient admits to suicidal and self-harm ideations. The patient is presenting with psychotic and paranoid behaviors. During an encounter with the patient, he was unable to answer questions appropriately. Plan: The patient is a safety risk to self and does require psychiatric inpatient admission for stabilization and treatment.  HPI: Per Dr. York Cerise: Nathan Koch is a 29 y.o. male with a history of depression and methamphetamine abuse and multiple prior psychiatric admissions.  He presents tonight voluntarily but accompanied by law enforcement for depression and suicidal thoughts.  He admits to a history of methamphetamine abuse and as result of his substance abuse issues and "other things going on" he is no longer able to see his child.  This is led him to be increasingly depressed and had thoughts about killing himself.  He said earlier tonight it was "really bad" but currently is a little bit better.  He does not have a specific plan.  He reports his symptoms are gradually worsening over time and they are severe but nothing in particular makes it better.  They separated from his child makes it worse.  He denies pain.  He denies shortness of breath.  No recent history of nausea, vomiting, nor dysuria.  He denies any other drug use except for methamphetamine.  Past Psychiatric History:  Anxiety Depression Methamphetamine abuse ([name])  Risk to Self:   Yes Risk to Others:   Yes Prior Inpatient Therapy:  Unknown Prior Outpatient Therapy:   Unknown  Past Medical History:  Past Medical History:  Diagnosis Date  . Anxiety   . Asthma   . Depression   . Methamphetamine abuse [facility])     Past Surgical History:  Procedure Laterality Date  . CLOSED  REDUCTION MANDIBLE N/A 07/02/2018   Procedure: CLOSED REDUCTION MANDIBULAR;  Surgeon: Vernie Murders, MD;  Location: [facility] ORS;  Service: ENT;  Laterality: N/A;   Family History:  Family History  Problem Relation Age of Onset  . Asthma Mother   . Asthma Brother    Family Psychiatric  History: Unknown Social History:  Social History   Substance and Sexual Activity  Alcohol Use Not Currently  . Alcohol/week: 7.0 standard drinks  . Types: 7 Standard drinks or equivalent per week     Social History   Substance and Sexual Activity  Drug Use Yes  . Types: Methamphetamines    Social History   Socioeconomic History  . Marital status: Single    Spouse name: Not on file  . Number of children: Not on file  . Years of education: Not on file  . Highest education level: Not on file  Occupational History  . Not on file  Tobacco Use  . Smoking status: Current Every Day Smoker    Packs/day: 1.00    Types: Cigarettes  . Smokeless tobacco: Never Used  . Tobacco comment: started smoking again December 2019  Substance and Sexual Activity  . Alcohol use: Not Currently    Alcohol/week: 7.0 standard drinks    Types: 7 Standard drinks or equivalent per week  . Drug use: Yes    Types: Methamphetamines  . Sexual activity: Not on file  Other Topics Concern  . Not on file  Social History Narrative  . Not on file   Social Determinants of Health   Financial Resource Strain:   . Difficulty of Paying Living Expenses:   Food Insecurity:   . Worried About Programme researcher, broadcasting/film/video in the Last Year:   . Barista in the Last Year:   Transportation Needs:   . Freight forwarder (Medical):   Marland Kitchen Lack of Transportation (Non-Medical):   Physical Activity:   . Days of Exercise per Week:   . Minutes of Exercise per Session:   Stress:   . Feeling of Stress :   Social Connections:   . Frequency of Communication with Friends and Family:   . Frequency of Social Gatherings with Friends and  Family:   . Attends Religious Services:   . Active Member of Clubs or Organizations:   . Attends Banker Meetings:   Marland Kitchen Marital Status:    Additional Social History:    Allergies:   Allergies  Allergen Reactions  . Ibuprofen Swelling    Labs:  Results for orders placed or performed during the hospital encounter of 09/13/19 (from the past 48 hour(s))  CBC     Status: Abnormal   Collection Time: 09/13/19 10:26 PM  Result Value Ref Range   WBC 10.6 (H) 4.0 - 10.5 K/uL   RBC 4.85 4.22 - 5.81 MIL/uL   Hemoglobin 14.1 13.0 - 17.0 g/dL   HCT 16.1 09.6 - 04.5 %   MCV 84.5 80.0 - 100.0 fL   MCH 29.1 26.0 - 34.0 pg   MCHC 34.4 30.0 - 36.0 g/dL   RDW 40.9 81.1 - 91.4 %   Platelets 374 150 - 400 K/uL   nRBC 0.0 0.0 -  0.2 %    Comment: Performed at [facility], [address] [place], [place] [address]  Comprehensive metabolic panel     Status: Abnormal   Collection Time: 09/13/19 10:26 PM  Result Value Ref Range   Sodium 142 135 - 145 mmol/L   Potassium 4.3 3.5 - 5.1 mmol/L   Chloride 105 98 - 111 mmol/L   CO2 24 22 - 32 mmol/L   Glucose, Bld 89 70 - 99 mg/dL    Comment: Glucose reference range applies only to samples taken after fasting for at least 8 hours.   BUN 15 6 - 20 mg/dL   Creatinine, Ser 6.21 0.61 - 1.24 mg/dL   Calcium 9.8 8.9 - 30.8 mg/dL   Total Protein 8.4 (H) 6.5 - 8.1 g/dL   Albumin 5.0 3.5 - 5.0 g/dL   AST 25 15 - 41 U/L   ALT 17 0 - 44 U/L   Alkaline Phosphatase 62 38 - 126 U/L   Total Bilirubin 2.3 (H) 0.3 - 1.2 mg/dL   GFR calc non Af Amer >60 >60 mL/min   GFR calc Af Amer >60 >60 mL/min   Anion gap 13 5 - 15    Comment: Performed at [facility], [address] [place], [place] [address]  Ethanol     Status: None   Collection Time: 09/13/19 10:26 PM  Result Value Ref Range   Alcohol, Ethyl (B) <10 <10 mg/dL    Comment: (NOTE) Lowest detectable limit for serum alcohol is 10 mg/dL. For medical purposes  only. Performed at [facility], [address] [place], [place] [address]   Salicylate level     Status: Abnormal   Collection Time: 09/13/19 10:26 PM  Result Value Ref Range   Salicylate Lvl <7.0 (L) 7.0 - 30.0 mg/dL    Comment: Performed at [facility], [address] [place], [place] [address]  Acetaminophen level     Status: Abnormal   Collection Time: 09/13/19 10:26 PM  Result Value Ref Range   Acetaminophen (Tylenol), Serum <10 (L) 10 - 30 ug/mL    Comment: (NOTE) Therapeutic concentrations vary significantly. A range of 10-30 ug/mL  may be an effective concentration for many patients. However, some  are best treated at concentrations outside of this range. Acetaminophen concentrations >150 ug/mL at 4 hours after ingestion  and >50 ug/mL at 12 hours after ingestion are often associated with  toxic reactions. Performed at [facility], [address] [place], [place] [address]     No current facility-administered medications for this encounter.   Current Outpatient Medications  Medication Sig Dispense Refill  . albuterol (PROVENTIL HFA;VENTOLIN HFA) 108 (90 Base) MCG/ACT inhaler Inhale 2 puffs into the lungs every 4 (four) hours as needed for wheezing or shortness of breath. 1 Inhaler 2  . cyclobenzaprine (FLEXERIL) 5 MG tablet Take 1 tablet (5 mg total) by mouth 3 (three) times daily as needed for muscle spasms. 15 tablet 0  . Fluticasone-Salmeterol (ADVAIR DISKUS) 250-50 MCG/DOSE AEPB Inhale 1 puff into the lungs 2 (two) times daily. 60 each 2  . lidocaine (XYLOCAINE) 2 % solution Use as directed 15 mLs in the mouth or throat as needed for mouth pain. 100 mL 0  . ondansetron (ZOFRAN ODT) 4 MG disintegrating tablet Take 1 tablet (4 mg total) by mouth every 8 (eight) hours as needed for nausea or vomiting. 6 tablet 0    Musculoskeletal: Strength & Muscle Tone: within normal limits Gait &  Station: normal Patient leans: N/A  Psychiatric  Specialty Exam: Physical Exam  Nursing note and vitals reviewed. Constitutional: He is oriented to person, place, and time.  Respiratory: Effort normal.  Musculoskeletal:        General: Normal range of motion.     Cervical back: Normal range of motion and neck supple.  Neurological: He is alert and oriented to person, place, and time.    Review of Systems  Psychiatric/Behavioral: Positive for agitation, behavioral problems, hallucinations and suicidal ideas. The patient is nervous/anxious.   All other systems reviewed and are negative.   Blood pressure (!) 151/86, pulse (!) 116, temperature 98.1 F (36.7 C), temperature source Oral, resp. rate 18, height 5\' 9"  (1.753 m), weight 59 kg, SpO2 100 %.Body mass index is 19.2 kg/m.  General Appearance: Bizarre and Disheveled  Eye Contact:  Poor  Speech:  Blocked and Slow  Volume:  Decreased  Mood:  Anxious, Depressed, Dysphoric and Irritable  Affect:  Congruent, Constricted, Depressed and Inappropriate  Thought Process:  Disorganized and Descriptions of Associations: Tangential  Orientation:  Full (Time, Place, and Person)  Thought Content:  Delusions and Hallucinations: Auditory  Suicidal Thoughts:  Yes.  without intent/plan  Homicidal Thoughts:  No  Memory:  Immediate;   Poor Recent;   Poor Remote;   Poor  Judgement:  Impaired  Insight:  Lacking  Psychomotor Activity:  Increased  Concentration:  Concentration: Poor and Attention Span: Poor  Recall:  Poor  Fund of Knowledge:  Poor  Language:  Poor  Akathisia:  Negative  Handed:  Right  AIMS (if indicated):     Assets:  Communication Skills Desire for Improvement Financial Resources/Insurance Housing Resilience Social Support  ADL's:  Intact  Cognition:  Impaired,  Mild  Sleep:    Insomnia     Treatment Plan Summary: Plan Patient meets criteria for psychiatric inpatient admission.  Disposition: Recommend psychiatric Inpatient admission when medically  cleared. Supportive therapy provided about ongoing stressors.  [name], NP 09/14/2019 1:12 AM

## 2019-09-14 NOTE — ED Notes (Signed)
Pt brought into ED BHU via sally port and wand with metal detector for safety by [employer] officer. Patient oriented to unit/care area: Pt informed of unit policies and procedures.  Informed that, for their safety, care areas are designed for safety and monitored by security cameras at all times. Patient verbalizes understanding, and verbal contract for safety obtained.Pt shown to their room.  

## 2019-09-14 NOTE — Plan of Care (Signed)
Pt rates depression 8/10. Pt denies anxiety, SI, HI and AVH. Pt was educated on care plan and verbalizes understanding. Pt was encouraged to attend groups and go outdoors. Torrie Mayers RN Problem: Education: Goal: Knowledge of [facility] Education information/materials will improve Outcome: Progressing Goal: Emotional status will improve Outcome: Progressing Goal: Mental status will improve Outcome: Progressing Goal: Verbalization of understanding the information provided will improve Outcome: Progressing   Problem: Activity: Goal: Interest or engagement in activities will improve Outcome: Progressing Goal: Sleeping patterns will improve Outcome: Progressing   Problem: Coping: Goal: Ability to verbalize frustrations and anger appropriately will improve Outcome: Progressing Goal: Ability to demonstrate self-control will improve Outcome: Progressing   Problem: Health Behavior/Discharge Planning: Goal: Identification of resources available to assist in meeting health care needs will improve Outcome: Progressing Goal: Compliance with treatment plan for underlying cause of condition will improve Outcome: Progressing   Problem: Physical Regulation: Goal: Ability to maintain clinical measurements within normal limits will improve Outcome: Progressing   Problem: Safety: Goal: Periods of time without injury will increase Outcome: Progressing

## 2019-09-14 NOTE — ED Provider Notes (Signed)
[facility] Emergency Department Provider Note  ____________________________________________   First MD Initiated Contact with Patient 09/13/19 2301     (approximate)  I have reviewed the triage vital signs and the nursing notes.   HISTORY  Chief Complaint Psychiatric Evaluation    HPI Nathan Koch is a 29 y.o. male with a history of depression and methamphetamine abuse and multiple prior psychiatric admissions.  He presents tonight voluntarily but accompanied by law enforcement for depression and suicidal thoughts.  He admits to a history of methamphetamine abuse and as result of his substance abuse issues and "other things going on" he is no longer able to see his child.  This is led him to be increasingly depressed and had thoughts about killing himself.  He said earlier tonight it was "really bad" but currently is a little bit better.  He does not have a specific plan.  He reports his symptoms are gradually worsening over time and they are severe but nothing in particular makes it better.  They separated from his child makes it worse.  He denies pain.  He denies shortness of breath.  No recent history of nausea, vomiting, nor dysuria.  He denies any other drug use except for methamphetamine.         Past Medical History:  Diagnosis Date  . Anxiety   . Asthma   . Depression   . Methamphetamine abuse [facility])     Patient Active Problem List   Diagnosis Date Noted  . 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 MDD (major depressive disorder), recurrent, severe, with psychosis ([name]) 09/14/2019  . Asthma 06/26/2014    Past Surgical History:  Procedure Laterality Date  . CLOSED REDUCTION MANDIBLE N/A 07/02/2018   Procedure: CLOSED REDUCTION MANDIBULAR;  Surgeon: Vernie Murders, MD;  Location: [facility] ORS;  Service: ENT;  Laterality: N/A;    Prior to Admission medications   Medication Sig Start Date End Date Taking? Authorizing Provider  albuterol  (PROVENTIL HFA;VENTOLIN HFA) 108 (90 Base) MCG/ACT inhaler Inhale 2 puffs into the lungs every 4 (four) hours as needed for wheezing or shortness of breath. 10/10/17   Triplett, Rulon Eisenmenger B, FNP  cyclobenzaprine (FLEXERIL) 5 MG tablet Take 1 tablet (5 mg total) by mouth 3 (three) times daily as needed for muscle spasms. 08/18/18   Menshew, Charlesetta Ivory, PA-C  Fluticasone-Salmeterol (ADVAIR DISKUS) 250-50 MCG/DOSE AEPB Inhale 1 puff into the lungs 2 (two) times daily. 10/10/17   Triplett, Cari B, FNP  lidocaine (XYLOCAINE) 2 % solution Use as directed 15 mLs in the mouth or throat as needed for mouth pain. 05/11/19   Triplett, Rulon Eisenmenger B, FNP  ondansetron (ZOFRAN ODT) 4 MG disintegrating tablet Take 1 tablet (4 mg total) by mouth every 8 (eight) hours as needed for nausea or vomiting. 07/18/18   Arnaldo Natal, MD    Allergies Ibuprofen  Family History  Problem Relation Age of Onset  . Asthma Mother   . Asthma Brother     Social History Social History   Tobacco Use  . Smoking status: Current Every Day Smoker    Packs/day: 1.00    Types: Cigarettes  . Smokeless tobacco: Never Used  . Tobacco comment: started smoking again December 2019  Substance Use Topics  . Alcohol use: Not Currently    Alcohol/week: 7.0 standard drinks    Types: 7 Standard drinks or equivalent per week  . Drug use: Yes    Types: Methamphetamines    Review of  Systems Constitutional: No fever/chills Eyes: No visual changes. ENT: No sore throat. Cardiovascular: Denies chest pain. Respiratory: Denies shortness of breath. Gastrointestinal: No abdominal pain.  No nausea, no vomiting.  No diarrhea.  No constipation. Genitourinary: Negative for dysuria. Musculoskeletal: Negative for neck pain.  Negative for back pain. Integumentary: Negative for rash. Neurological: Negative for headaches, focal weakness or numbness. Psychiatric:  Depression, suicidal ideation.  ____________________________________________   PHYSICAL  EXAM:  VITAL SIGNS: ED Triage Vitals [09/13/19 2223]  Enc Vitals Group     BP (!) 151/86     Pulse Rate (!) 116     Resp 18     Temp 98.1 F (36.7 C)     Temp Source Oral     SpO2 100 %     Weight 59 kg (130 lb)     Height 1.753 m (5\' 9" )     Head Circumference      Peak Flow      Pain Score 7     Pain Loc      Pain Edu?      Excl. in GC?     Constitutional: Alert and oriented.   Eyes: Conjunctivae are normal.  Head: Atraumatic. Nose: No congestion/rhinnorhea. Mouth/Throat: Patient is wearing a mask. Neck: No stridor.  No meningeal signs.   Cardiovascular: Normal rate, regular rhythm. Good peripheral circulation. Grossly normal heart sounds. Respiratory: Normal respiratory effort.  No retractions. Gastrointestinal: Soft and nontender. No distention.  Musculoskeletal: No lower extremity tenderness nor edema. No gross deformities of extremities. Neurologic:  Normal speech and language. No gross focal neurologic deficits are appreciated.  Skin:  Skin is warm, dry and intact. Psychiatric: Mood and affect are flat and blunted.  Admits to depression and intermittent thoughts of suicide without a specific plan.  ____________________________________________   LABS (all labs ordered are listed, but only abnormal results are displayed)  Labs Reviewed  CBC - Abnormal; Notable for the following components:      Result Value   WBC 10.6 (*)    All other components within normal limits  COMPREHENSIVE METABOLIC PANEL - Abnormal; Notable for the following components:   Total Protein 8.4 (*)    Total Bilirubin 2.3 (*)    All other components within normal limits  SALICYLATE LEVEL - Abnormal; Notable for the following components:   Salicylate Lvl <7.0 (*)    All other components within normal limits  ACETAMINOPHEN LEVEL - Abnormal; Notable for the following components:   Acetaminophen (Tylenol), Serum <10 (*)    All other components within normal limits  SARS CORONAVIRUS 2 BY RT  PCR (HOSPITAL ORDER, PERFORMED IN [facility])  ETHANOL  URINALYSIS, COMPLETE (UACMP) WITH MICROSCOPIC  URINE DRUG SCREEN, QUALITATIVE ([facility] ONLY)   ____________________________________________  EKG  None - EKG not ordered by ED physician ____________________________________________  RADIOLOGY , personally viewed and evaluated these images (plain radiographs) as part of my medical decision making, as well as reviewing the written report by the radiologist.  ED MD interpretation:  No indication for emergent EKG  Official radiology report(s): No results found.  ____________________________________________   PROCEDURES   Procedure(s) performed (including Critical Care):  Procedures   ____________________________________________   INITIAL IMPRESSION / MDM / ASSESSMENT AND PLAN / ED COURSE  As part of my medical decision making, I reviewed the following data within the electronic MEDICAL RECORD NUMBER Nursing notes reviewed and incorporated, Labs reviewed , Old chart reviewed, A consult was requested and obtained from this/these  consultant(s) Psychiatry, Notes from prior ED visits and  Controlled Substance Database   Differential diagnosis includes, but is not limited to, depression, substance-induced mood disorder, other nonspecific mood disorder, adjustment disorder.  Patient's vital signs are stable and his medical work-up is reassuring.  I have added on coingestions such as acetaminophen and salicylate but I have a low suspicion that these will be positive.  The patient has been admitted to psychiatric hospitals in the past including a relatively recent transfer to [facility].  I have ordered a psychiatric consult and he wants to be here and wants help so I have left him voluntary at this time.  The patient has been placed in psychiatric observation due to the need to provide a safe environment for the patient while obtaining psychiatric consultation  and evaluation, as well as ongoing medical and medication management to treat the patient's condition.  The patient has not been placed under full IVC at this time.       Clinical Course as of Sep 14 418  Thu Sep 14, 2019  0015 I discussed the case in person with Kennyth Lose with the psychiatry service.  She agrees the patient needs to be admitted and can remain voluntary at this time.   [CF]    Clinical Course User Index [CF] Hinda Kehr, MD     ____________________________________________  FINAL CLINICAL IMPRESSION(S) / ED DIAGNOSES  Final diagnoses:  Depression, unspecified depression type  Methamphetamine abuse ([place])     MEDICATIONS GIVEN DURING THIS VISIT:  Medications  LORazepam (ATIVAN) tablet 1 mg (1 mg Oral Given 09/14/19 0247)     ED Discharge Orders    None      *Please note:  Nathan Koch was evaluated in Emergency Department on 09/14/2019 for the symptoms described in the history of present illness. He was evaluated in the context of the global COVID-19 pandemic, which necessitated consideration that the patient might be at risk for infection with the SARS-CoV-2 virus that causes COVID-19. Institutional protocols and algorithms that pertain to the evaluation of patients at risk for COVID-19 are in a state of rapid change based on information released by regulatory bodies including the CDC and federal and state organizations. These policies and algorithms were followed during the patient's care in the ED.  Some ED evaluations and interventions may be delayed as a result of limited staffing during the pandemic.*  Note:  This document was prepared using Dragon voice recognition software and may include unintentional dictation errors.   Hinda Kehr, MD 09/14/19 [phone]
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
## 2019-09-15 NOTE — [facility] Group Notes (Signed)
Balance In Life 09/15/2019 9:30AM/1PM  Type of Therapy/Topic:  Group Therapy:  Balance in Life  Participation Level:  Did Not Attend  Description of Group:   This group will address the concept of balance and how it feels and looks when one is unbalanced. Patients will be encouraged to process areas in their lives that are out of balance and identify reasons for remaining unbalanced. Facilitators will guide patients in utilizing problem-solving interventions to address and correct the stressor making their life unbalanced. Understanding and applying boundaries will be explored and addressed for obtaining and maintaining a balanced life. Patients will be encouraged to explore ways to assertively make their unbalanced needs known to significant others in their lives, using other group members and facilitator for support and feedback.  Therapeutic Goals: 1. Patient will identify two or more emotions or situations they have that consume much of in their lives. 2. Patient will identify signs/triggers that life has become out of balance:  3. Patient will identify two ways to set boundaries in order to achieve balance in their lives:  4. Patient will demonstrate ability to communicate their needs through discussion and/or role plays  Summary of Patient Progress:    Therapeutic Modalities:   Cognitive Behavioral Therapy Solution-Focused Therapy Assertiveness Training  Cierria Height T Roselani Grajeda, LCSW  

## 2019-09-15 NOTE — Plan of Care (Signed)
  Problem: Coping: Goal: Ability to demonstrate self-control will improve Outcome: Not Progressing   Problem: Education: Goal: Emotional status will improve Outcome: Progressing   Problem: Education: Goal: Emotional status will improve Outcome: Progressing   Problem: Education: Goal: Emotional status will improve Outcome: Progressing Goal: Mental status will improve Outcome: Progressing Goal: Verbalization of understanding the information provided will improve Outcome: Progressing   Problem: Activity: Goal: Interest or engagement in activities will improve Outcome: Progressing Goal: Sleeping patterns will improve Outcome: Progressing   Problem: Coping: Goal: Ability to verbalize frustrations and anger appropriately will improve Outcome: Progressing

## 2019-09-15 NOTE — Progress Notes (Signed)
[facility] MD Progress Note  09/15/2019 4:04 PM Nathan Koch  MRN:  [id number] Subjective: Patient seen chart reviewed.  Patient was very irritable yesterday afternoon but has been withdrawn today.  Coming down off of and adjusting to being off of methamphetamines.  Reports his mood today is dysphoric but denies suicidal intent.  Very lethargic little motivation.  He has given consent to pursue inpatient substance abuse treatment. Principal Problem: Methamphetamine use ([name]) Diagnosis: Principal Problem:   Methamphetamine use ([name]) Active Problems:   MDD (major depressive disorder), single episode, severe with psychosis ([name])  Total Time spent with patient: 30 minutes  Past Psychiatric History: Past history of drug abuse and depression with substance abuse related mood problems  Past Medical History:  Past Medical History:  Diagnosis Date  . Anxiety   . Asthma   . Depression   . Methamphetamine abuse [facility])     Past Surgical History:  Procedure Laterality Date  . CLOSED REDUCTION MANDIBLE N/A 07/02/2018   Procedure: CLOSED REDUCTION MANDIBULAR;  Surgeon: Vernie Murders, MD;  Location: [facility] ORS;  Service: ENT;  Laterality: N/A;   Family History:  Family History  Problem Relation Age of Onset  . Asthma Mother   . Asthma Brother    Family Psychiatric  History: See previous Social History:  Social History   Substance and Sexual Activity  Alcohol Use Not Currently  . Alcohol/week: 7.0 standard drinks  . Types: 7 Standard drinks or equivalent per week     Social History   Substance and Sexual Activity  Drug Use Yes  . Types: Methamphetamines    Social History   Socioeconomic History  . Marital status: Single    Spouse name: Not on file  . Number of children: Not on file  . Years of education: Not on file  . Highest education level: Not on file  Occupational History  . Not on file  Tobacco Use  . Smoking status: Current Every Day Smoker    Packs/day: 1.00    Types:  Cigarettes  . Smokeless tobacco: Never Used  . Tobacco comment: started smoking again December 2019  Substance and Sexual Activity  . Alcohol use: Not Currently    Alcohol/week: 7.0 standard drinks    Types: 7 Standard drinks or equivalent per week  . Drug use: Yes    Types: Methamphetamines  . Sexual activity: Not on file  Other Topics Concern  . Not on file  Social History Narrative  . Not on file   Social Determinants of Health   Financial Resource Strain:   . Difficulty of Paying Living Expenses:   Food Insecurity:   . Worried About Programme researcher, broadcasting/film/video in the Last Year:   . Barista in the Last Year:   Transportation Needs:   . Freight forwarder (Medical):   Marland Kitchen Lack of Transportation (Non-Medical):   Physical Activity:   . Days of Exercise per Week:   . Minutes of Exercise per Session:   Stress:   . Feeling of Stress :   Social Connections:   . Frequency of Communication with Friends and Family:   . Frequency of Social Gatherings with Friends and Family:   . Attends Religious Services:   . Active Member of Clubs or Organizations:   . Attends Banker Meetings:   Marland Kitchen Marital Status:    Additional Social History:  Sleep: Fair  Appetite:  Fair  Current Medications: Current Facility-Administered Medications  Medication Dose Route Frequency Provider Last Rate Last Admin  . acetaminophen (TYLENOL) tablet 650 mg  650 mg Oral Q6H PRN Gillermo Murdoch, NP      . alum & mag hydroxide-simeth (MAALOX/MYLANTA) 200-200-20 MG/5ML suspension 30 mL  30 mL Oral Q4H PRN Gillermo Murdoch, NP      . citalopram (CELEXA) tablet 20 mg  20 mg Oral Daily Luca Dyar, Jackquline Denmark, MD   20 mg at 09/15/19 0813  . magnesium hydroxide (MILK OF MAGNESIA) suspension 30 mL  30 mL Oral Daily PRN Gillermo Murdoch, NP        Lab Results:  Results for orders placed or performed during the hospital encounter of 09/13/19 (from the past 48  hour(s))  CBC     Status: Abnormal   Collection Time: 09/13/19 10:26 PM  Result Value Ref Range   WBC 10.6 (H) 4.0 - 10.5 K/uL   RBC 4.85 4.22 - 5.81 MIL/uL   Hemoglobin 14.1 13.0 - 17.0 g/dL   HCT 16.1 09.6 - 04.5 %   MCV 84.5 80.0 - 100.0 fL   MCH 29.1 26.0 - 34.0 pg   MCHC 34.4 30.0 - 36.0 g/dL   RDW 40.9 81.1 - 91.4 %   Platelets 374 150 - 400 K/uL   nRBC 0.0 0.0 - 0.2 %    Comment: Performed at [facility], [address] [place], [place] [address]  Comprehensive metabolic panel     Status: Abnormal   Collection Time: 09/13/19 10:26 PM  Result Value Ref Range   Sodium 142 135 - 145 mmol/L   Potassium 4.3 3.5 - 5.1 mmol/L   Chloride 105 98 - 111 mmol/L   CO2 24 22 - 32 mmol/L   Glucose, Bld 89 70 - 99 mg/dL    Comment: Glucose reference range applies only to samples taken after fasting for at least 8 hours.   BUN 15 6 - 20 mg/dL   Creatinine, Ser 6.21 0.61 - 1.24 mg/dL   Calcium 9.8 8.9 - 30.8 mg/dL   Total Protein 8.4 (H) 6.5 - 8.1 g/dL   Albumin 5.0 3.5 - 5.0 g/dL   AST 25 15 - 41 U/L   ALT 17 0 - 44 U/L   Alkaline Phosphatase 62 38 - 126 U/L   Total Bilirubin 2.3 (H) 0.3 - 1.2 mg/dL   GFR calc non Af Amer >60 >60 mL/min   GFR calc Af Amer >60 >60 mL/min   Anion gap 13 5 - 15    Comment: Performed at [facility], [address] [place], [place] [address]  Ethanol     Status: None   Collection Time: 09/13/19 10:26 PM  Result Value Ref Range   Alcohol, Ethyl (B) <10 <10 mg/dL    Comment: (NOTE) Lowest detectable limit for serum alcohol is 10 mg/dL. For medical purposes only. Performed at [facility], [address] [place], [place] [address]   Salicylate level     Status: Abnormal   Collection Time: 09/13/19 10:26 PM  Result Value Ref Range   Salicylate Lvl <7.0 (L) 7.0 - 30.0 mg/dL    Comment: Performed at [facility], [address] [place], [place] [address]  Acetaminophen level     Status: Abnormal    Collection Time: 09/13/19 10:26 PM  Result Value Ref Range   Acetaminophen (Tylenol), Serum <10 (L) 10 - 30 ug/mL    Comment: (  NOTE) Therapeutic concentrations vary significantly. A range of 10-30 ug/mL  may be an effective concentration for many patients. However, some  are best treated at concentrations outside of this range. Acetaminophen concentrations >150 ug/mL at 4 hours after ingestion  and >50 ug/mL at 12 hours after ingestion are often associated with  toxic reactions. Performed at [facility], [address] [place], [place] [address]   SARS Coronavirus 2 by RT PCR (hospital order, performed in [facility] hospital lab) Nasopharyngeal Nasopharyngeal Swab     Status: None   Collection Time: 09/14/19 12:07 AM   Specimen: Nasopharyngeal Swab  Result Value Ref Range   SARS Coronavirus 2 NEGATIVE NEGATIVE    Comment: (NOTE) SARS-CoV-2 target nucleic acids are NOT DETECTED. The SARS-CoV-2 RNA is generally detectable in upper and lower respiratory specimens during the acute phase of infection. The lowest concentration of SARS-CoV-2 viral copies this assay can detect is 250 copies / mL. A negative result does not preclude SARS-CoV-2 infection and should not be used as the sole basis for treatment or other patient management decisions.  A negative result may occur with improper specimen collection / handling, submission of specimen other than nasopharyngeal swab, presence of viral mutation(s) within the areas targeted by this assay, and inadequate number of viral copies (<250 copies / mL). A negative result must be combined with clinical observations, patient history, and epidemiological information. Fact Sheet for Patients:   StrictlyIdeas.no Fact Sheet for Healthcare Providers: BankingDealers.co.za This test is not yet approved or cleared  by the [place] FDA and has been authorized for detection and/or diagnosis  of SARS-CoV-2 by FDA under an Emergency Use Authorization (EUA).  This EUA will remain in effect (meaning this test can be used) for the duration of the COVID-19 declaration under Section 564(b)(1) of the Act, 21 U.S.C. section 360bbb-3(b)(1), unless the authorization is terminated or revoked sooner. Performed at [facility], [address] [place], [place] [address]     Blood Alcohol level:  Lab Results  Component Value Date   [facility] <10 09/13/2019   ETH <10 27/78/2423    Metabolic Disorder Labs: No results found for: HGBA1C, MPG No results found for: PROLACTIN Lab Results  Component Value Date   CHOL 156 07/11/2014   TRIG 178 (A) 07/11/2014   HDL 46 07/11/2014   [place] 74 07/11/2014    Physical Findings: AIMS:  , ,  ,  ,    CIWA:    COWS:     Musculoskeletal: Strength & Muscle Tone: within normal limits Gait & Station: normal Patient leans: N/A  Psychiatric Specialty Exam: Physical Exam  Nursing note and vitals reviewed. Constitutional: He appears well-developed and well-nourished.  HENT:  Head: Normocephalic and atraumatic.  Eyes: Pupils are equal, round, and reactive to light. Conjunctivae are normal.  Cardiovascular: Regular rhythm and normal heart sounds.  Respiratory: Effort normal. No respiratory distress.  GI: Soft.  Musculoskeletal:        General: Normal range of motion.     Cervical back: Normal range of motion.  Neurological: He is alert.  Skin: Skin is warm and dry.  Psychiatric: His affect is blunt. His speech is delayed. He is slowed. Cognition and memory are impaired. He expresses impulsivity. He expresses no homicidal and no suicidal ideation.    Review of Systems  Constitutional: Negative.   HENT: Negative.   Eyes: Negative.   Respiratory: Negative.   Cardiovascular: Negative.   Gastrointestinal: Negative.   Musculoskeletal: Negative.  Skin: Negative.   Neurological: Negative.   Psychiatric/Behavioral: Positive for  dysphoric mood.    Blood pressure 123/75, pulse (!) 103, temperature 97.8 F (36.6 C), temperature source Oral, resp. rate 18, height 5\' 9"  (1.753 m), weight 82.1 kg, SpO2 99 %.Body mass index is 26.73 kg/m.  General Appearance: Disheveled  Eye Contact:  Minimal  Speech:  Slow  Volume:  Decreased  Mood:  Dysphoric  Affect:  Congruent  Thought Process:  Coherent  Orientation:  Full (Time, Place, and Person)  Thought Content:  Logical, Rumination and Tangential  Suicidal Thoughts:  Yes.  without intent/plan  Homicidal Thoughts:  No  Memory:  Immediate;   Fair Recent;   Fair Remote;   Fair  Judgement:  Fair  Insight:  Fair  Psychomotor Activity:  Normal  Concentration:  Concentration: Fair  Recall:  of Knowledge:  Fair  Language:  Fair  Akathisia:  No  Handed:  Right  AIMS (if indicated):     Assets:  Desire for Improvement Housing Physical Health Resilience  ADL's:  Impaired  Cognition:  Impaired,  Mild  Sleep:  Number of Hours: 8.15     Treatment Plan Summary: Daily contact with patient to assess and evaluate symptoms and progress in treatment, Medication management and Plan Supportive therapy and encouragement.  Encourage patient to attend groups.  No change to citalopram.  Team is working on possible referral for inpatient substance abuse treatment  [employer], MD 09/15/2019, 4:04 PM

## 2019-09-15 NOTE — Progress Notes (Signed)
Recreation Therapy Notes   Date: 09/15/2019  Time: 9:30 am   Location: Craft room    Behavioral response: N/A   Intervention Topic: Self-esteem   Discussion/Intervention: Patient did not attend group.   Clinical Observations/Feedback:  Patient did not attend group.   Shamina Etheridge LRT/CTRS        Khole Branch 09/15/2019 11:28 AM

## 2019-09-15 NOTE — Plan of Care (Signed)
Patient denies SI / HI / AVH. Patient is focused on discharge. Patient denies any psych s/s at this time. Patient is minimal with assessment. Patient is adherent with scheduled medication. Patient is without complaint at this time. Patient's safety is maintained on the unit.    Problem: Education: Goal: Knowledge of [facility] Education information/materials will improve Outcome: Not Progressing Goal: Emotional status will improve Outcome: Not Progressing Goal: Mental status will improve Outcome: Not Progressing Goal: Verbalization of understanding the information provided will improve Outcome: Not Progressing

## 2019-09-15 NOTE — Progress Notes (Signed)
Recreation Therapy Notes  INPATIENT RECREATION THERAPY ASSESSMENT  Patient Details Name: Nathan Koch MRN: [id number] DOB: [birth year] Today's Date: 09/15/2019       Information Obtained From: Patient  Able to Participate in Assessment/Interview: Yes  Patient Presentation: Responsive  Reason for Admission (Per Patient): Active Symptoms, Substance Abuse  Patient Stressors:    Coping Skills:   Substance Abuse, Talk  Leisure Interests (2+):  Social - Family, Exercise - Walking  Frequency of Recreation/Participation: Monthly  Awareness of Community Resources:  Yes  Community Resources:  Park  Current Use:    If no, Barriers?:    Expressed Interest in [employer] Information:    [place] of Residence:  Film/video editor  Patient Main Form of Transportation: Therapist, music  Patient Strengths:  Caring  Patient Identified Areas of Improvement:  Stop using  Patient Goal for Hospitalization:  Find somewhere to go  Current SI (including self-harm):  No  Current HI:  No  Current AVH: No  Staff Intervention Plan: Group Attendance, Collaborate with Interdisciplinary Treatment Team  Consent to Intern Participation: N/A  Nathan  Koch 09/15/2019, 3:57 PM

## 2019-09-15 NOTE — [facility] Counselor (Signed)
CSW faxed referral to [facility].  CSW received confirmation that the fax was successful.  Penni Homans, MSW, LCSW 09/15/2019 10:20 AM
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
## 2019-09-16 MED ORDER — HYDROXYZINE HCL 25 MG PO TABS
25.0000 mg | ORAL_TABLET | Freq: Three times a day (TID) | ORAL | Status: DC | PRN
  Administered 2019-09-18 – 2019-09-21 (×2): 25 mg via ORAL
  Filled 2019-09-16 (×2): qty 1

## 2019-09-16 NOTE — Progress Notes (Signed)
Patient alert and oriented x 4, affect is blunted , thoughouts are organized and coherent , he appears irritable and isolated to self. Patient currently denies SI/HI/AVH no distress noted, 15 minutes safety checks maintained will continue to monitor.

## 2019-09-16 NOTE — [facility] Group Notes (Signed)
[facility] LCSW Group Therapy Note  Date/Time:  09/16/2019  1:00PM  Type of Therapy and Topic:  Group Therapy:  Healthy and Unhealthy Supports  Participation Level:  Did Not Attend   Description of Group:  Patients in this group were introduced to the idea of adding a variety of healthy supports to address the various needs in their lives.Patients discussed what additional healthy supports could be helpful in their recovery and wellness after discharge in order to prevent future hospitalizations.   An emphasis was placed on using counselor, doctor, therapy groups, 12-step groups, and problem-specific support groups to expand supports.  They also worked as a group on developing a specific plan for several patients to deal with unhealthy supports through boundary-setting, psychoeducation with loved ones, and even termination of relationships.   Therapeutic Goals:   1)  discuss importance of adding supports to stay well once out of the hospital  2)  compare healthy versus unhealthy supports and identify some examples of each  3)  generate ideas and descriptions of healthy supports that can be added  4)  offer mutual support about how to address unhealthy supports  5)  encourage active participation in and adherence to discharge plan    Summary of Patient Progress:   Patient did not attend group.   Therapeutic Modalities:   Motivational Interviewing Brief Solution-Focused Therapy   Dennys Traughber, MSW, LCSW   

## 2019-09-16 NOTE — Progress Notes (Signed)
D: Pt denies SI/HI/AV hallucinations. Pt is pleasant and cooperative. Pt is isolative to his room. A: Pt was offered support and encouragement. Pt was given scheduled medications. Pt was encourage to attend groups. Q 15 minute checks were done for safety.  R: Pt is taking medication. Pt has no complaints.Pt receptive to treatment and safety maintained on unit.

## 2019-09-16 NOTE — Progress Notes (Signed)
[facility] MD Progress Note  09/16/2019 12:01 PM Nathan Koch  MRN:  [id number]   Nathan Koch is a 29yo M with psych h/o depression and substance (methamphetamine) use disorder, who was admitted to [facility] unit due to suicidal ideations.  Patient seen.  Chart reviewed. Patient discussed with nursing; no overnight events reported.  Subjective:  Patient continues to report depressed mood and increased anxiety. No motivation to participate in milieu activities. Feeling tired. He expresses vague suicidal thoughts without particular plan. Reports feeling safe from harming self here in the hospital. Reports good sleep last night. Denies side effects from new medication. No psychotic symptoms. He is interested in "as needed" med for anxiety.  Principal Problem: Methamphetamine use ([place]) Diagnosis: Principal Problem:   Methamphetamine use ([place]) Active Problems:   MDD (major depressive disorder), single episode, severe with psychosis ([place])  Total Time spent with patient: 20 minutes  Past Psychiatric History: see H&P  Past Medical History:  Past Medical History:  Diagnosis Date  . Anxiety   . Asthma   . Depression   . Methamphetamine abuse [facility])     Past Surgical History:  Procedure Laterality Date  . CLOSED REDUCTION MANDIBLE N/A 07/02/2018   Procedure: CLOSED REDUCTION MANDIBULAR;  Surgeon: Margaretha Sheffield, MD;  Location: [facility] ORS;  Service: ENT;  Laterality: N/A;   Family History:  Family History  Problem Relation Age of Onset  . Asthma Mother   . Asthma Brother    Family Psychiatric  History: see H&P Social History:  Social History   Substance and Sexual Activity  Alcohol Use Not Currently  . Alcohol/week: 7.0 standard drinks  . Types: 7 Standard drinks or equivalent per week     Social History   Substance and Sexual Activity  Drug Use Yes  . Types: Methamphetamines    Social History   Socioeconomic History  . Marital status: Single    Spouse name: Not on file  . Number of  children: Not on file  . Years of education: Not on file  . Highest education level: Not on file  Occupational History  . Not on file  Tobacco Use  . Smoking status: Current Every Day Smoker    Packs/day: 1.00    Types: Cigarettes  . Smokeless tobacco: Never Used  . Tobacco comment: started smoking again December 2019  Substance and Sexual Activity  . Alcohol use: Not Currently    Alcohol/week: 7.0 standard drinks    Types: 7 Standard drinks or equivalent per week  . Drug use: Yes    Types: Methamphetamines  . Sexual activity: Not on file  Other Topics Concern  . Not on file  Social History Narrative  . Not on file   Social Determinants of Health   Financial Resource Strain:   . Difficulty of Paying Living Expenses:   Food Insecurity:   . Worried About Charity fundraiser in the Last Year:   . Arboriculturist in the Last Year:   Transportation Needs:   . Film/video editor (Medical):   Marland Kitchen Lack of Transportation (Non-Medical):   Physical Activity:   . Days of Exercise per Week:   . Minutes of Exercise per Session:   Stress:   . Feeling of Stress :   Social Connections:   . Frequency of Communication with Friends and Family:   . Frequency of Social Gatherings with Friends and Family:   . Attends Religious Services:   . Active Member of Clubs or Organizations:   .  Attends Banker Meetings:   Marland Kitchen Marital Status:    Additional Social History:                         Sleep: Fair  Appetite:  Fair  Current Medications: Current Facility-Administered Medications  Medication Dose Route Frequency Provider Last Rate Last Admin  . acetaminophen (TYLENOL) tablet 650 mg  650 mg Oral Q6H PRN Gillermo Murdoch, NP      . alum & mag hydroxide-simeth (MAALOX/MYLANTA) 200-200-20 MG/5ML suspension 30 mL  30 mL Oral Q4H PRN Gillermo Murdoch, NP      . citalopram (CELEXA) tablet 20 mg  20 mg Oral Daily Clapacs, [name], MD   20 mg at 09/16/19 0900  .  hydrOXYzine (ATARAX/VISTARIL) tablet 25 mg  25 mg Oral TID PRN Thalia Party, MD      . magnesium hydroxide (MILK OF MAGNESIA) suspension 30 mL  30 mL Oral Daily PRN Gillermo Murdoch, NP        Lab Results: No results found for this or any previous visit (from the past 48 hour(s)).  Blood Alcohol level:  Lab Results  Component Value Date   ETH <10 09/13/2019   ETH <10 08/16/2019    Metabolic Disorder Labs: No results found for: HGBA1C, MPG No results found for: PROLACTIN Lab Results  Component Value Date   CHOL 156 07/11/2014   TRIG 178 (A) 07/11/2014   HDL 46 07/11/2014   LDLCALC 74 07/11/2014    Physical Findings: AIMS:  , ,  ,  ,    CIWA:    COWS:     Musculoskeletal: Strength & Muscle Tone: within normal limits Gait & Station: normal Patient leans: N/A  Psychiatric Specialty Exam: Physical Exam  Review of Systems  Blood pressure (!) 108/57, pulse 69, temperature 97.7 F (36.5 C), temperature source Oral, resp. rate 18, height 5\' 9"  (1.753 m), weight 82.1 kg, SpO2 99 %.Body mass index is 26.73 kg/m.  General Appearance: Casual  Eye Contact:  Minimal  Speech:  Slow  Volume:  Decreased  Mood:  Anxious  Affect:  Congruent  Thought Process:  Coherent and Goal Directed  Orientation:  Full (Time, Place, and Person)  Thought Content:  Logical and Rumination  Suicidal Thoughts:  Yes.  without intent/plan  Homicidal Thoughts:  No  Memory:  Immediate;   Fair Recent;   Fair Remote;   Fair  Judgement:  Fair  Insight:  Fair  Psychomotor Activity:  Decreased  Concentration:  Concentration: Fair and Attention Span: Fair  Recall:  of Knowledge:  Fair  Language:  Fair  Akathisia:  No  Handed:  Right  AIMS (if indicated):     Assets:  Communication Skills Desire for Improvement Housing Physical Health Resilience  ADL's:  Intact  Cognition:  WNL  Sleep:  Number of Hours: 8.15     Treatment Plan Summary:  29yo M with psych h/o depression and  substance (methamphetamine) use disorder, who was admitted to [facility] unit due to suicidal ideations. Today he is still vaguely suicidal, depressed and anxious. No side effects from Citalopram, that was started 2 days ago and today will be continued without changes. Will add Vistaril PRN anxiety. SW is working on possible referral for inpatient substance abuse treatment.  Impression:  Major depressive disorder, single episode, severe, without psychosis. Substance use disorder (methamphetamine use disorder).  Plan: -continue inpatient psych admission; 15-minute checks; daily contact with patient to assess  and evaluate symptoms and progress in treatment; psychoeducation. -continue Citalopram 20mg  PO daily for depression, anxiety. -start Vistaril 25mg  PO TID PRN anxiety; -Disposition: to be determined; possible referral for inpatient substance abuse treatment.   , MD 09/16/2019, 12:01 PM

## 2019-09-17 NOTE — Plan of Care (Signed)
Cooperative with treatment, medication compliant, no complaints noted on shift thus. In bed resting at this time. Patient inhaler placed in pharmacy bin for verification of home medication.

## 2019-09-17 NOTE — Progress Notes (Signed)
[facility] MD Progress Note  09/17/2019 10:15 AM Nathan Koch  MRN:  [id number]   Nathan Koch is a 29yo M with psych h/o depression and substance (methamphetamine) use disorder, who was admitted to [facility] unit due to suicidal ideations.  Patient seen. Chart reviewed. Patient discussed with nursing; no overnight events reported.  Subjective:  Patient continues to report depressed mood and increased anxiety, although admits that prescribed yesterday Vistaril helps for anxiety. He continues to report no motivation to participate in milieu activities. Reports he is tired, sleepy, and relates that to the withdrawal from a stimulant. He continues to express vague suicidal thoughts without particular plan. He says his stressors are substance use, homelessness, financial issuees, family issues. Reports feeling safe from harming self here in the hospital. Reports good sleep last night.  No psychotic symptoms. Denies side effects from medications.  Principal Problem: Methamphetamine use ([name]) Diagnosis: Principal Problem:   Methamphetamine use ([name]) Active Problems:   MDD (major depressive disorder), single episode, severe with psychosis ([name])  Total Time spent with patient: 20 minutes  Past Psychiatric History: see H&P  Past Medical History:  Past Medical History:  Diagnosis Date  . Anxiety   . Asthma   . Depression   . Methamphetamine abuse [facility])     Past Surgical History:  Procedure Laterality Date  . CLOSED REDUCTION MANDIBLE N/A 07/02/2018   Procedure: CLOSED REDUCTION MANDIBULAR;  Surgeon: Vernie Murders, MD;  Location: [facility] ORS;  Service: ENT;  Laterality: N/A;   Family History:  Family History  Problem Relation Age of Onset  . Asthma Mother   . Asthma Brother    Family Psychiatric  History: see H&P Social History:  Social History   Substance and Sexual Activity  Alcohol Use Not Currently  . Alcohol/week: 7.0 standard drinks  . Types: 7 Standard drinks or equivalent per week      Social History   Substance and Sexual Activity  Drug Use Yes  . Types: Methamphetamines    Social History   Socioeconomic History  . Marital status: Single    Spouse name: Not on file  . Number of children: Not on file  . Years of education: Not on file  . Highest education level: Not on file  Occupational History  . Not on file  Tobacco Use  . Smoking status: Current Every Day Smoker    Packs/day: 1.00    Types: Cigarettes  . Smokeless tobacco: Never Used  . Tobacco comment: started smoking again December 2019  Substance and Sexual Activity  . Alcohol use: Not Currently    Alcohol/week: 7.0 standard drinks    Types: 7 Standard drinks or equivalent per week  . Drug use: Yes    Types: Methamphetamines  . Sexual activity: Not on file  Other Topics Concern  . Not on file  Social History Narrative  . Not on file   Social Determinants of Health   Financial Resource Strain:   . Difficulty of Paying Living Expenses:   Food Insecurity:   . Worried About Programme researcher, broadcasting/film/video in the Last Year:   . Barista in the Last Year:   Transportation Needs:   . Freight forwarder (Medical):   Marland Kitchen Lack of Transportation (Non-Medical):   Physical Activity:   . Days of Exercise per Week:   . Minutes of Exercise per Session:   Stress:   . Feeling of Stress :   Social Connections:   . Frequency of Communication with  Friends and Family:   . Frequency of Social Gatherings with Friends and Family:   . Attends Religious Services:   . Active Member of Clubs or Organizations:   . Attends Archivist Meetings:   Marland Kitchen Marital Status:    Additional Social History:                         Sleep: Fair  Appetite:  Fair  Current Medications: Current Facility-Administered Medications  Medication Dose Route Frequency Provider Last Rate Last Admin  . acetaminophen (TYLENOL) tablet 650 mg  650 mg Oral Q6H PRN [name], NP      . alum & mag  hydroxide-simeth (MAALOX/MYLANTA) 200-200-20 MG/5ML suspension 30 mL  30 mL Oral Q4H PRN [name], NP      . citalopram (CELEXA) tablet 20 mg  20 mg Oral Daily Clapacs, [name], MD   20 mg at 09/17/19 0900  . hydrOXYzine (ATARAX/VISTARIL) tablet 25 mg  25 mg Oral TID PRN Larita Fife, MD      . magnesium hydroxide (MILK OF MAGNESIA) suspension 30 mL  30 mL Oral Daily PRN [name], NP        Lab Results: No results found for this or any previous visit (from the past 23 hour(s)).  Blood Alcohol level:  Lab Results  Component Value Date   ETH <10 09/13/2019   ETH <10 10/25/1599    Metabolic Disorder Labs: No results found for: HGBA1C, MPG No results found for: PROLACTIN Lab Results  Component Value Date   CHOL 156 07/11/2014   TRIG 178 (A) 07/11/2014   HDL 46 07/11/2014   [place] 74 07/11/2014    Physical Findings: AIMS:  , ,  ,  ,    CIWA:    COWS:     Musculoskeletal: Strength & Muscle Tone: within normal limits Gait & Station: normal Patient leans: N/A  Psychiatric Specialty Exam: Physical Exam   Review of Systems   Blood pressure 106/64, pulse 61, temperature 97.8 F (36.6 C), temperature source Oral, resp. rate 18, height 5\' 9"  (1.753 m), weight 82.1 kg, SpO2 100 %.Body mass index is 26.73 kg/m.  General Appearance: Casual  Eye Contact:  Minimal  Speech:  Slow  Volume:  Decreased  Mood:  Anxious  Affect:  Congruent  Thought Process:  Coherent and Goal Directed  Orientation:  Full (Time, Place, and Person)  Thought Content:  Logical and Rumination  Suicidal Thoughts:  Yes.  without intent/plan  Homicidal Thoughts:  No  Memory:  Immediate;   Fair Recent;   Fair Remote;   Fair  Judgement:  Fair  Insight:  Fair  Psychomotor Activity:  Decreased  Concentration:  Concentration: Fair and Attention Span: Fair  Recall:  [employer] of Knowledge:  Fair  Language:  Fair  Akathisia:  No  Handed:  Right  AIMS (if indicated):     Assets:   Communication Skills Desire for Improvement Housing Physical Health Resilience  ADL's:  Intact  Cognition:  WNL  Sleep:  Number of Hours: 7.5     Treatment Plan Summary:  29yo M with psych h/o depression and substance (methamphetamine) use disorder, who was admitted to [facility] unit due to suicidal ideations. Today he continues to express vague suicidal thoughts without plan, depressed mood and anxiety. No side effects from Citalopram, that was started 3 days ago and today will be continued without changes. Vistaril PRN anxiety added yesterday and patient reports some  good effect from the medication. SW is working on possible referral for inpatient substance abuse treatment.  Impression:  Major depressive disorder, single episode, severe, without psychosis. Substance use disorder (methamphetamine use disorder).  Plan: -continue inpatient psych admission; 15-minute checks; daily contact with patient to assess and evaluate symptoms and progress in treatment; psychoeducation. -continue Citalopram 20mg  PO daily for depression, anxiety. -continue Vistaril 25mg  PO TID PRN anxiety; -Disposition: to be determined; possible referral for inpatient substance abuse treatment.   , MD 09/17/2019, 10:15 AM

## 2019-09-17 NOTE — Progress Notes (Signed)
D: Pt denies SI/HI. Pt is pleasant and cooperative. Patient voices no concerns. A: Pt was offered support and encouragement. Pt was given scheduled medications. Pt was encourage to attend groups. Q 15 minute checks were done for safety.  R: Pt is taking medication. Pt has no complaints.Pt receptive to treatment and safety maintained on unit.

## 2019-09-18 MED ORDER — ALBUTEROL SULFATE HFA 108 (90 BASE) MCG/ACT IN AERS
2.0000 | INHALATION_SPRAY | RESPIRATORY_TRACT | Status: DC | PRN
  Administered 2019-09-18: 2 via RESPIRATORY_TRACT
  Filled 2019-09-18: qty 6.7

## 2019-09-18 MED ORDER — CITALOPRAM HYDROBROMIDE 20 MG PO TABS
20.0000 mg | ORAL_TABLET | Freq: Every day | ORAL | Status: DC
  Administered 2019-09-19 – 2019-09-21 (×2): 20 mg via ORAL
  Filled 2019-09-18: qty 1

## 2019-09-18 NOTE — [facility] Group Notes (Signed)
[facility] Group Notes:  (Nursing/MHT/Case Management/Adjunct)  Date:  09/18/2019  Time:  9:44 AM  Type of Therapy:  COMMUNITY MEETING  Participation Level:  Minimal  Participation Quality:  Attentive  Affect:  Appropriate  Cognitive:  Appropriate  Insight:  Appropriate  Engagement in Group:  Limited  Modes of Intervention:  Activity, Discussion and Education  Summary of Progress/Problems:  Nathan Koch 09/18/2019, 9:44 AM

## 2019-09-18 NOTE — [facility] Group Notes (Signed)
[facility] Group Notes:  (Nursing/MHT/Case Management/Adjunct)  Date:  09/18/2019  Time:  8:56 PM  Type of Therapy:  Group Therapy  Participation Level:  Active  Participation Quality:  Appropriate  Affect:  Appropriate  Cognitive:  Alert  Insight:  Good  Engagement in Group:  Engaged  Modes of Intervention:  Support  Summary of Progress/Problems:  Nathan Koch 09/18/2019, 8:56 PM

## 2019-09-18 NOTE — Plan of Care (Signed)
Pt denies depression, anxiety, SI, HI and AVH. Pt was educated on care plan and verbalizes understanding. Pt was encouraged to attend groups. Glennda Weatherholtz RN Problem: Education: Goal: Knowledge of [place] [facility] Education information/materials will improve Outcome: Progressing Goal: Emotional status will improve Outcome: Progressing Goal: Mental status will improve Outcome: Progressing Goal: Verbalization of understanding the information provided will improve Outcome: Progressing   Problem: Activity: Goal: Interest or engagement in activities will improve Outcome: Progressing Goal: Sleeping patterns will improve Outcome: Progressing   Problem: Coping: Goal: Ability to verbalize frustrations and anger appropriately will improve Outcome: Progressing Goal: Ability to demonstrate self-control will improve Outcome: Progressing   Problem: Health Behavior/Discharge Planning: Goal: Identification of resources available to assist in meeting health care needs will improve Outcome: Progressing Goal: Compliance with treatment plan for underlying cause of condition will improve Outcome: Progressing   Problem: Physical Regulation: Goal: Ability to maintain clinical measurements within normal limits will improve Outcome: Progressing   Problem: Safety: Goal: Periods of time without injury will increase Outcome: Progressing   

## 2019-09-18 NOTE — Progress Notes (Signed)
Recreation Therapy Notes  Date: 09/18/2019  Time: 9:30 am   Location: Craft room    Behavioral response: N/A   Intervention Topic: Necessities   Discussion/Intervention: Patient did not attend group.   Clinical Observations/Feedback:  Patient did not attend group.   Burhanuddin Kohlmann LRT/CTRS        Senon Nixon 09/18/2019 11:51 AM

## 2019-09-18 NOTE — Progress Notes (Signed)
Patient alert and oriented x 4, affect is blunted , thoughouts are organized and coherent , he appears irritable and isolated to self. Patient currently denies SI/HI/AVH no distress noted, 15 minutes safety checks maintained will continue to monitor.closely.

## 2019-09-18 NOTE — [facility] Group Notes (Signed)
[facility] Group Notes:  (Nursing/MHT/Case Management/Adjunct)  Date:  09/18/2019  Time:  1:31 PM  Type of Therapy:  Psychoeducational Skills  Participation Level:  Did Not Attend    Nathan Koch 09/18/2019, 1:31 PM

## 2019-09-18 NOTE — Progress Notes (Signed)
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  Patient continues to have some moodiness.  Gets irritable at times.  In interview however says he feels like he is doing a little better.  Denies suicidal or homicidal thought.  Denies hallucinations.  Hygiene is still borderline at best.  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 Anxiety   . Asthma   . Depression   . Methamphetamine abuse [facility])     Past Surgical History:  Procedure Laterality Date  . CLOSED REDUCTION MANDIBLE N/A 07/02/2018   Procedure: CLOSED REDUCTION MANDIBULAR;  Surgeon: Margaretha Sheffield, MD;  Location: [facility] ORS;  Service: ENT;  Laterality: N/A;   Family History:  Family History  Problem Relation Age of Onset  . Asthma Mother   . Asthma Brother    Family Psychiatric  History: See previous Social History:  Social History   Substance and Sexual Activity  Alcohol Use Not Currently  . Alcohol/week: 7.0 standard drinks  . Types: 7 Standard drinks or equivalent per week     Social History   Substance and Sexual Activity  Drug Use Yes  . Types: Methamphetamines    Social History   Socioeconomic History  . Marital status: Single    Spouse name: Not on file  . Number of children: Not on file  . Years of education: Not on file  . Highest education level: Not on file  Occupational History  . Not on file  Tobacco Use  . Smoking status: Current Every Day Smoker     Packs/day: 1.00    Types: Cigarettes  . Smokeless tobacco: Never Used  . Tobacco comment: started smoking again December 2019  Substance and Sexual Activity  . Alcohol use: Not Currently    Alcohol/week: 7.0 standard drinks    Types: 7 Standard drinks or equivalent per week  . Drug use: Yes    Types: Methamphetamines  . Sexual activity: Not on file  Other Topics Concern  . Not on file  Social History Narrative  . Not on file   Social Determinants of Health   Financial Resource Strain:   . Difficulty of Paying Living Expenses:   Food Insecurity:   . Worried About Charity fundraiser in the Last Year:   . Arboriculturist in the Last Year:   Transportation Needs:   . Film/video editor (Medical):   Marland Kitchen Lack of Transportation (Non-Medical):   Physical Activity:   . Days of Exercise per Week:   . Minutes of Exercise per Session:   Stress:   . Feeling of Stress :   Social Connections:   . Frequency of Communication with Friends and Family:   . Frequency of Social Gatherings with Friends and Family:   . Attends Religious Services:   . Active Member of Clubs or Organizations:   . Attends Archivist Meetings:   Marland Kitchen Marital Status:    Additional Social History:  Sleep: Fair  Appetite:  Fair  Current Medications: Current Facility-Administered Medications  Medication Dose Route Frequency Provider Last Rate Last Admin  . acetaminophen (TYLENOL) tablet 650 mg  650 mg Oral Q6H PRN Gillermo Murdoch, NP      . albuterol (VENTOLIN HFA) 108 (90 Base) MCG/ACT inhaler 2 puff  2 puff Inhalation Q4H PRN Melvyn Hommes, Jackquline Denmark, MD      . alum & mag hydroxide-simeth (MAALOX/MYLANTA) 200-200-20 MG/5ML suspension 30 mL  30 mL Oral Q4H PRN Gillermo Murdoch, NP      . Melene Muller ON 09/19/2019] citalopram (CELEXA) tablet 20 mg  20 mg Oral QHS Katerra Ingman T, MD      . hydrOXYzine (ATARAX/VISTARIL) tablet 25 mg  25 mg Oral TID PRN Thalia Party, MD      .  magnesium hydroxide (MILK OF MAGNESIA) suspension 30 mL  30 mL Oral Daily PRN Gillermo Murdoch, NP        Lab Results: No results found for this or any previous visit (from the past 48 hour(s)).  Blood Alcohol level:  Lab Results  Component Value Date   ETH <10 09/13/2019   ETH <10 08/16/2019    Metabolic Disorder Labs: No results found for: HGBA1C, MPG No results found for: PROLACTIN Lab Results  Component Value Date   CHOL 156 07/11/2014   TRIG 178 (A) 07/11/2014   HDL 46 07/11/2014   LDLCALC 74 07/11/2014    Physical Findings: AIMS:  , ,  ,  ,    CIWA:    COWS:     Musculoskeletal: Strength & Muscle Tone: within normal limits Gait & Station: normal Patient leans: N/A  Psychiatric Specialty Exam: Physical Exam  Nursing note and vitals reviewed. Constitutional: He appears well-developed and well-nourished.  HENT:  Head: Normocephalic and atraumatic.  Eyes: Pupils are equal, round, and reactive to light. Conjunctivae are normal.  Cardiovascular: Regular rhythm and normal heart sounds.  Respiratory: Effort normal. No respiratory distress.  GI: Soft.  Musculoskeletal:        General: Normal range of motion.     Cervical back: Normal range of motion.  Neurological: He is alert.  Skin: Skin is warm and dry.  Psychiatric: He has a normal mood and affect. His behavior is normal. Judgment and thought content normal.    Review of Systems  Constitutional: Negative.   HENT: Negative.   Eyes: Negative.   Respiratory: Negative.   Cardiovascular: Negative.   Gastrointestinal: Negative.   Musculoskeletal: Negative.   Skin: Negative.   Neurological: Negative.   Psychiatric/Behavioral: Negative.     Blood pressure 107/61, pulse (!) 56, temperature 98 F (36.7 C), temperature source Oral, resp. rate 16, height 5\' 9"  (1.753 m), weight 82.1 kg, SpO2 100 %.Body mass index is 26.73 kg/m.  General Appearance: Casual  Eye Contact:  Good  Speech:  Clear and Coherent   Volume:  Normal  Mood:  Euthymic  Affect:  Congruent  Thought Process:  Goal Directed  Orientation:  Full (Time, Place, and Person)  Thought Content:  Logical  Suicidal Thoughts:  No  Homicidal Thoughts:  No  Memory:  Immediate;   Fair Recent;   Fair Remote;   Fair  Judgement:  Fair  Insight:  Fair  Psychomotor Activity:  Normal  Concentration:  Concentration: Fair  Recall:  of Knowledge:  Fair  Language:  Fair  Akathisia:  No  Handed:  Right  AIMS (if indicated):     Assets:  Desire for  Improvement Housing Physical Health  ADL's:  Intact  Cognition:  WNL  Sleep:  Number of Hours: 7     Treatment Plan Summary: Plan No change to medicine except to add his albuterol inhaler as needed which is at his request.  Supportive therapy.  Review of the plan to try to get him into substance abuse treatment  Mordecai Rasmussen, MD 09/18/2019, 4:28 PM

## 2019-09-18 NOTE — [facility] Group Notes (Signed)
LCSW Group Therapy Note   09/18/2019 2:39 PM  Type of Therapy and Topic:  Group Therapy:  Overcoming Obstacles   Participation Level:  Did Not Attend   Description of Group:    In this group patients will be encouraged to explore what they see as obstacles to their own wellness and recovery. They will be guided to discuss their thoughts, feelings, and behaviors related to these obstacles. The group will process together ways to cope with barriers, with attention given to specific choices patients can make. Each patient will be challenged to identify changes they are motivated to make in order to overcome their obstacles. This group will be process-oriented, with patients participating in exploration of their own experiences as well as giving and receiving support and challenge from other group members.   Therapeutic Goals: 1. Patient will identify personal and current obstacles as they relate to admission. 2. Patient will identify barriers that currently interfere with their wellness or overcoming obstacles.  3. Patient will identify feelings, thought process and behaviors related to these barriers. 4. Patient will identify two changes they are willing to make to overcome these obstacles:      Summary of Patient Progress X   Therapeutic Modalities:   Cognitive Behavioral Therapy Solution Focused Therapy Motivational Interviewing Relapse Prevention Therapy  Penni Homans, MSW, LCSW 09/18/2019 2:39 PM

## 2019-09-18 NOTE — Plan of Care (Signed)
D- Patient alert and oriented. Patient presents in a pleasant mood on assessment stating that he had an ok day and did not express any major complaints or concerns. Patient endorsed both depression and anxiety, reporting that being away from his daughter and fiance has him feeling this way. Patient also stated that he is supposed to be going to [facility] for treatment and not knowing what's to come has him a little anxious. Patient denies SI, HI, AVH, and pain at this time.   A- Scheduled medications administered to patient, per MD orders. Support and encouragement provided.  Routine safety checks conducted every 15 minutes.  Patient informed to notify staff with problems or concerns.  R- No adverse drug reactions noted. Patient contracts for safety at this time. Patient compliant with medications and treatment plan. Patient receptive, calm, and cooperative. Patient interacts well with others on the unit.  Patient remains safe at this time.  Problem: Education: Goal: Knowledge of [place] [facility] Education information/materials will improve Outcome: Progressing Goal: Emotional status will improve Outcome: Progressing Goal: Mental status will improve Outcome: Progressing Goal: Verbalization of understanding the information provided will improve Outcome: Progressing   Problem: Activity: Goal: Interest or engagement in activities will improve Outcome: Progressing Goal: Sleeping patterns will improve Outcome: Progressing   Problem: Coping: Goal: Ability to verbalize frustrations and anger appropriately will improve Outcome: Progressing Goal: Ability to demonstrate self-control will improve Outcome: Progressing   Problem: Health Behavior/Discharge Planning: Goal: Identification of resources available to assist in meeting health care needs will improve Outcome: Progressing Goal: Compliance with treatment plan for underlying cause of condition will improve Outcome: Progressing   Problem:  Physical Regulation: Goal: Ability to maintain clinical measurements within normal limits will improve Outcome: Progressing   Problem: Safety: Goal: Periods of time without injury will increase Outcome: Progressing

## 2019-09-18 NOTE — Progress Notes (Signed)
Pt was sitting in the hall crying. He says that he does not want to go to rehab in [place] and "leave his family". I gave him emotional support and encouraged him. He said that no matter what he did he was still homeless. Torrie Mayers RN

## 2019-09-19 MED ORDER — NICOTINE 21 MG/24HR TD PT24
21.0000 mg | MEDICATED_PATCH | Freq: Every day | TRANSDERMAL | Status: DC
  Administered 2019-09-19 – 2019-09-20 (×2): 21 mg via TRANSDERMAL
  Filled 2019-09-19 (×3): qty 1

## 2019-09-19 NOTE — Progress Notes (Addendum)
Pt rates depression 10/10, hopelessness 7/10 and anxiety 7/10. Pt denies SI, HI and AVH. Pt was educated on care plan and verbalizes understanding. Pt was encouraged to attend groups. Torrie Mayers RN

## 2019-09-19 NOTE — Tx Team (Signed)
Interdisciplinary Treatment and Diagnostic Plan Update  09/19/2019 Time of Session: 9:00AM Nathan Koch MRN: [id number]  Principal Diagnosis: Methamphetamine use ([name])  Secondary Diagnoses: Principal Problem:   Methamphetamine use ([name]) Active Problems:   MDD (major depressive disorder), single episode, severe with psychosis ([name])   Current Medications:  Current Facility-Administered Medications  Medication Dose Route Frequency Provider Last Rate Last Admin  . acetaminophen (TYLENOL) tablet 650 mg  650 mg Oral Q6H PRN Gillermo Murdoch, NP      . albuterol (VENTOLIN HFA) 108 (90 Base) MCG/ACT inhaler 2 puff  2 puff Inhalation Q4H PRN Clapacs, Jackquline Denmark, MD   2 puff at 09/18/19 2043  . alum & mag hydroxide-simeth (MAALOX/MYLANTA) 200-200-20 MG/5ML suspension 30 mL  30 mL Oral Q4H PRN Gillermo Murdoch, NP      . citalopram (CELEXA) tablet 20 mg  20 mg Oral QHS Clapacs, [name], MD      . hydrOXYzine (ATARAX/VISTARIL) tablet 25 mg  25 mg Oral TID PRN Thalia Party, MD   25 mg at 09/18/19 2041  . magnesium hydroxide (MILK OF MAGNESIA) suspension 30 mL  30 mL Oral Daily PRN Gillermo Murdoch, NP      . nicotine (NICODERM CQ - dosed in mg/24 hours) patch 21 mg  21 mg Transdermal Daily Clapacs, Jackquline Denmark, MD   21 mg at 09/19/19 1302   PTA Medications: Medications Prior to Admission  Medication Sig Dispense Refill Last Dose  . albuterol (PROVENTIL HFA;VENTOLIN HFA) 108 (90 Base) MCG/ACT inhaler Inhale 2 puffs into the lungs every 4 (four) hours as needed for wheezing or shortness of breath. 1 Inhaler 2   . cyclobenzaprine (FLEXERIL) 5 MG tablet Take 1 tablet (5 mg total) by mouth 3 (three) times daily as needed for muscle spasms. 15 tablet 0   . Fluticasone-Salmeterol (ADVAIR DISKUS) 250-50 MCG/DOSE AEPB Inhale 1 puff into the lungs 2 (two) times daily. 60 each 2   . lidocaine (XYLOCAINE) 2 % solution Use as directed 15 mLs in the mouth or throat as needed for mouth pain. 100 mL 0   .  ondansetron (ZOFRAN ODT) 4 MG disintegrating tablet Take 1 tablet (4 mg total) by mouth every 8 (eight) hours as needed for nausea or vomiting. 6 tablet 0     Patient Stressors:    Patient Strengths:    Treatment Modalities: Medication Management, Group therapy, Case management,  1 to 1 session with clinician, Psychoeducation, Recreational therapy.   Physician Treatment Plan for Primary Diagnosis: Methamphetamine use ([name]) Long Term Goal(s): Improvement in symptoms so as ready for discharge Improvement in symptoms so as ready for discharge   Short Term Goals: Ability to demonstrate self-control will improve Compliance with prescribed medications will improve  Medication Management: Evaluate patient's response, side effects, and tolerance of medication regimen.  Therapeutic Interventions: 1 to 1 sessions, Unit Group sessions and Medication administration.  Evaluation of Outcomes: Progressing  Physician Treatment Plan for Secondary Diagnosis: Principal Problem:   Methamphetamine use ([name]) Active Problems:   MDD (major depressive disorder), single episode, severe with psychosis ([name])  Long Term Goal(s): Improvement in symptoms so as ready for discharge Improvement in symptoms so as ready for discharge   Short Term Goals: Ability to demonstrate self-control will improve Compliance with prescribed medications will improve     Medication Management: Evaluate patient's response, side effects, and tolerance of medication regimen.  Therapeutic Interventions: 1 to 1 sessions, Unit Group sessions and Medication administration.  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 RN will report any adverse and/or side effects to prescribing provider.  Therapeutic Interventions: 1 on 1 counseling sessions, Psychoeducation, Medication administration, Evaluate responses to treatment, Monitor vital signs and CBGs as ordered, Perform/monitor CIWA, COWS, AIMS and Fall Risk screenings as ordered, Perform wound care treatments as ordered.  Evaluation of Outcomes: Progressing   LCSW Treatment Plan for Primary Diagnosis: Methamphetamine use ([name]) Long Term Goal(s): Safe transition to appropriate next level of care at discharge, Engage patient in therapeutic group addressing interpersonal concerns.  Short Term Goals: Engage patient in aftercare planning with referrals and resources, Increase social support, Increase ability to appropriately verbalize feelings, Increase emotional regulation, Facilitate acceptance of mental health diagnosis and concerns, Facilitate patient progression through stages of change regarding substance use diagnoses and concerns and Identify triggers associated with mental health/substance abuse issues  Therapeutic Interventions: Assess for all discharge needs, 1 to 1 time with Social worker, Explore available resources and support systems, Assess for adequacy in community support network, Educate family and significant other(s) on suicide prevention, Complete Psychosocial Assessment, Interpersonal group therapy.  Evaluation of Outcomes: Progressing   Progress in Treatment: Attending groups: No. Participating in groups: No. Taking medication as prescribed: Yes. Toleration medication: Yes. Family/Significant other contact made: Yes, individual(s) contacted:  once permission is given. Patient understands  diagnosis: Yes. Discussing patient identified problems/goals with staff: Yes. Medical problems stabilized or resolved: Yes. Denies suicidal/homicidal ideation: Yes. Issues/concerns per patient self-inventory: No. Other: none  New problem(s) identified: No, Describe:  none  New Short Term/Long Term Goal(s): detox, medication management for mood stabilization; elimination of SI thoughts; development of comprehensive mental wellness/sobriety plan. Update 09/19/2019:  No changes at this time  Patient Goals:  "finding long-term treatment" Update 09/19/2019:  No changes at this time  Discharge Plan or Barriers:  Patient reports plans to seek inpatient treatment.  Update 09/19/2019:  Patient has been accepted to [facility], no beds are available at this time, CSW will continue to follow up.    Reason for Continuation of Hospitalization: Depression Medication stabilization Suicidal ideation Withdrawal symptoms  Estimated Length of Stay:  1-7 days  Update 09/19/2019:  No changes at this time  Recreational Therapy: Patient Stressors: N/A Patient Goal: Patient will engage in groups without prompting or encouragement from LRT x3 group sessions within 5 recreation therapy group sessions  Attendees: Patient:  09/19/2019 3:27 PM  Physician: Dr. Weber Cooks, MD 09/19/2019 3:27 PM  Nursing: Collier Bullock, RN 09/19/2019 3:27 PM  RN Care Manager: 09/19/2019 3:27 PM  Social Worker: Assunta Curtis, LCSW 09/19/2019 3:27 PM  Recreational Therapist:  09/19/2019 3:27 PM  Other:  09/19/2019 3:27 PM  Other:  09/19/2019 3:27 PM  Other: 09/19/2019 3:27 PM    Scribe for Treatment Team: Rozann Lesches, LCSW 09/19/2019 3:27 PM

## 2019-09-19 NOTE — Progress Notes (Signed)
Pt states that he doesn't have a lot of energy. He states that he feels better but just wants more energy. I suggested that he speaks to [facility] tomorrow. Pt has been calm and cooperative today. Torrie Mayers RN

## 2019-09-19 NOTE — [facility] Group Notes (Signed)
[facility] Group Notes:  (Nursing/MHT/Case Management/Adjunct)  Date:  09/19/2019  Time:  11:12 AM  Type of Therapy:  COMMUNITY MEETING  Participation Level:  Did Not Attend   Summary of Progress/Problems:  Nathan Koch 09/19/2019, 11:12 AM

## 2019-09-19 NOTE — [facility] Group Notes (Signed)
[facility] Group Notes:  (Nursing/MHT/Case Management/Adjunct)  Date:  09/19/2019  Time:  10:28 PM  Type of Therapy:  Psychoeducational Skills  Participation Level:  None  Participation Quality:  Appropriate and Drowsy  Affect:  Appropriate and Blunted  Cognitive:  Appropriate  Insight:  Appropriate  Engagement in Group:  None  Modes of Intervention:  Discussion and Education  Summary of Progress/Problems:  Landry Mellow 09/19/2019, 10:28 PM

## 2019-09-19 NOTE — [facility] Group Notes (Signed)
Emotional Regulation 09/19/2019 9:30AM/1PM  Type of Therapy/Topic:  Group Therapy:  Emotion Regulation  Participation Level:  Did Not Attend   Description of Group:   The purpose of this group is to assist patients in learning to regulate negative emotions and experience positive emotions. Patients will be guided to discuss ways in which they have been vulnerable to their negative emotions. These vulnerabilities will be juxtaposed with experiences of positive emotions or situations, and patients will be challenged to use positive emotions to combat negative ones. Special emphasis will be placed on coping with negative emotions in conflict situations, and patients will process healthy conflict resolution skills.  Therapeutic Goals: 1. Patient will identify two positive emotions or experiences to reflect on in order to balance out negative emotions 2. Patient will label two or more emotions that they find the most difficult to experience 3. Patient will demonstrate positive conflict resolution skills through discussion and/or role plays  Summary of Patient Progress:       Therapeutic Modalities:   Cognitive Behavioral Therapy Feelings Identification Dialectical Behavioral Therapy   Suzan Slick, LCSW 09/19/2019 3:04 PM

## 2019-09-19 NOTE — Progress Notes (Signed)
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  Patient interviewed chart reviewed.  Patient has no new complaints.  Reports that his mood continues to be improved.  Not having anger outbursts or feeling hopeless or suicidal.  Agreeable to going to inpatient substance abuse treatment which is planned most likely for Thursday. Principal Problem: Methamphetamine use ([name]) Diagnosis: Principal Problem:   Methamphetamine use ([name]) Active Problems:   MDD (major depressive disorder), single episode, severe with psychosis ([name])  Total Time spent with patient: 30 minutes  Past Psychiatric History: Past history of substance abuse  Past Medical History:  Past Medical History:  Diagnosis Date  . Anxiety   . Asthma   . Depression   . Methamphetamine abuse [facility])     Past Surgical History:  Procedure Laterality Date  . CLOSED REDUCTION MANDIBLE N/A 07/02/2018   Procedure: CLOSED REDUCTION MANDIBULAR;  Surgeon: Vernie Murders, MD;  Location: [facility] ORS;  Service: ENT;  Laterality: N/A;   Family History:  Family History  Problem Relation Age of Onset  . Asthma Mother   . Asthma Brother    Family Psychiatric  History: See previous Social History:  Social History   Substance and Sexual Activity  Alcohol Use Not Currently  . Alcohol/week: 7.0 standard drinks  . Types: 7 Standard drinks or equivalent per week     Social History   Substance and Sexual Activity  Drug Use Yes  . Types: Methamphetamines    Social History   Socioeconomic History  . Marital status: Single    Spouse name: Not on file  . Number of children: Not on file  . Years of education: Not on file  . Highest education level: Not on file  Occupational History  . Not on file  Tobacco Use  . Smoking status: Current Every Day Smoker    Packs/day: 1.00    Types: Cigarettes  . Smokeless tobacco: Never Used  .  Tobacco comment: started smoking again December 2019  Substance and Sexual Activity  . Alcohol use: Not Currently    Alcohol/week: 7.0 standard drinks    Types: 7 Standard drinks or equivalent per week  . Drug use: Yes    Types: Methamphetamines  . Sexual activity: Not on file  Other Topics Concern  . Not on file  Social History Narrative  . Not on file   Social Determinants of Health   Financial Resource Strain:   . Difficulty of Paying Living Expenses:   Food Insecurity:   . Worried About Programme researcher, broadcasting/film/video in the Last Year:   . Barista in the Last Year:   Transportation Needs:   . Freight forwarder (Medical):   Marland Kitchen Lack of Transportation (Non-Medical):   Physical Activity:   . Days of Exercise per Week:   . Minutes of Exercise per Session:   Stress:   . Feeling of Stress :   Social Connections:   . Frequency of Communication with Friends and Family:   . Frequency of Social Gatherings with Friends and Family:   . Attends Religious Services:   . Active Member of Clubs or Organizations:   . Attends Banker Meetings:   Marland Kitchen Marital Status:    Additional Social History:                         Sleep:  Fair  Appetite:  Fair  Current Medications: Current Facility-Administered Medications  Medication Dose Route Frequency Provider Last Rate Last Admin  . acetaminophen (TYLENOL) tablet 650 mg  650 mg Oral Q6H PRN [name], NP      . albuterol (VENTOLIN HFA) 108 (90 Base) MCG/ACT inhaler 2 puff  2 puff Inhalation Q4H PRN Markeria Goetsch, [name], MD   2 puff at 09/18/19 2043  . alum & mag hydroxide-simeth (MAALOX/MYLANTA) 200-200-20 MG/5ML suspension 30 mL  30 mL Oral Q4H PRN [name], NP      . citalopram (CELEXA) tablet 20 mg  20 mg Oral QHS Mead Slane T, MD      . hydrOXYzine (ATARAX/VISTARIL) tablet 25 mg  25 mg Oral TID PRN Larita Fife, MD   25 mg at 09/18/19 2041  . magnesium hydroxide (MILK OF MAGNESIA) suspension 30  mL  30 mL Oral Daily PRN [name], NP      . nicotine (NICODERM CQ - dosed in mg/24 hours) patch 21 mg  21 mg Transdermal Daily Jahniah Pallas, [name], MD   21 mg at 09/19/19 1302    Lab Results: No results found for this or any previous visit (from the past 48 hour(s)).  Blood Alcohol level:  Lab Results  Component Value Date   ETH <10 09/13/2019   ETH <10 65/06/5463    Metabolic Disorder Labs: No results found for: HGBA1C, MPG No results found for: PROLACTIN Lab Results  Component Value Date   CHOL 156 07/11/2014   TRIG 178 (A) 07/11/2014   HDL 46 07/11/2014   [place] 74 07/11/2014    Physical Findings: AIMS:  , ,  ,  ,    CIWA:    COWS:     Musculoskeletal: Strength & Muscle Tone: within normal limits Gait & Station: normal Patient leans: N/A  Psychiatric Specialty Exam: Physical Exam  Nursing note and vitals reviewed. Constitutional: He appears well-developed and well-nourished.  HENT:  Head: Normocephalic and atraumatic.  Eyes: Pupils are equal, round, and reactive to light. Conjunctivae are normal.  Cardiovascular: Normal heart sounds.  Respiratory: Effort normal.  GI: Soft.  Musculoskeletal:        General: Normal range of motion.     Cervical back: Normal range of motion.  Neurological: He is alert.  Skin: Skin is warm and dry.  Psychiatric: He has a normal mood and affect. His behavior is normal. Judgment and thought content normal.    Review of Systems  Constitutional: Negative.   HENT: Negative.   Eyes: Negative.   Respiratory: Negative.   Cardiovascular: Negative.   Gastrointestinal: Negative.   Musculoskeletal: Negative.   Skin: Negative.   Neurological: Negative.   Psychiatric/Behavioral: Negative.     Blood pressure 101/61, pulse (!) 57, temperature 97.9 F (36.6 C), temperature source Oral, resp. rate 18, height 5\' 9"  (1.753 m), weight 82.1 kg, SpO2 100 %.Body mass index is 26.73 kg/m.  General Appearance: Casual  Eye Contact:   Good  Speech:  Clear and Coherent  Volume:  Normal  Mood:  Euthymic  Affect:  Congruent  Thought Process:  Goal Directed  Orientation:  Full (Time, Place, and Person)  Thought Content:  Logical  Suicidal Thoughts:  No  Homicidal Thoughts:  No  Memory:  Immediate;   Good Recent;   Fair Remote;   Fair  Judgement:  Fair  Insight:  Fair  Psychomotor Activity:  Normal  Concentration:  Concentration: Fair  Recall:  [employer] of Knowledge:  Fair  Language:  Fair  Akathisia:  No  Handed:  Right  AIMS (if indicated):     Assets:  Desire for Improvement Housing Physical Health  ADL's:  Intact  Cognition:  WNL  Sleep:  Number of Hours: 8.5     Treatment Plan Summary: Plan Continue current medication no indication for change.  Supportive counseling and therapy.  Encourage group attendance.  Likely discharge aim for Thursday  Mordecai Rasmussen, MD 09/19/2019, 4:52 PM

## 2019-09-19 NOTE — Progress Notes (Signed)
Recreation Therapy Notes    Date: 09/19/2019  Time: 9:30 am   Location: Craft room    Behavioral response: N/A   Intervention Topic: Coping Skills   Discussion/Intervention: Patient did not attend group.   Clinical Observations/Feedback:  Patient did not attend group.   Vilas Edgerly LRT/CTRS        Jorrell Kuster 09/19/2019 11:48 AM

## 2019-09-20 NOTE — Plan of Care (Signed)
Pt rates depression and hopelessness 7/10 and anxiety 8/10. Pt denies SI, HI and AVH. Pt was educated on care plan and verbalizes understanding.  Pt was encouraged to attend groups. Torrie Mayers RN Problem: Education: Goal: Knowledge of [facility] Education information/materials will improve Outcome: Progressing Goal: Emotional status will improve Outcome: Progressing Goal: Mental status will improve Outcome: Progressing Goal: Verbalization of understanding the information provided will improve Outcome: Progressing   Problem: Activity: Goal: Interest or engagement in activities will improve Outcome: Progressing Goal: Sleeping patterns will improve Outcome: Progressing   Problem: Coping: Goal: Ability to verbalize frustrations and anger appropriately will improve Outcome: Progressing Goal: Ability to demonstrate self-control will improve Outcome: Progressing   Problem: Health Behavior/Discharge Planning: Goal: Identification of resources available to assist in meeting health care needs will improve Outcome: Progressing Goal: Compliance with treatment plan for underlying cause of condition will improve Outcome: Progressing   Problem: Physical Regulation: Goal: Ability to maintain clinical measurements within normal limits will improve Outcome: Progressing   Problem: Safety: Goal: Periods of time without injury will increase Outcome: Progressing

## 2019-09-20 NOTE — BHH Group Notes (Signed)
LCSW Group Therapy Note  09/20/2019 1:00 PM  Type of Therapy/Topic:  Group Therapy:  Emotion Regulation  Participation Level:  Active   Description of Group:   The purpose of this group is to assist patients in learning to regulate negative emotions and experience positive emotions. Patients will be guided to discuss ways in which they have been vulnerable to their negative emotions. These vulnerabilities will be juxtaposed with experiences of positive emotions or situations, and patients will be challenged to use positive emotions to combat negative ones. Special emphasis will be placed on coping with negative emotions in conflict situations, and patients will process healthy conflict resolution skills.  Therapeutic Goals: 1. Patient will identify two positive emotions or experiences to reflect on in order to balance out negative emotions 2. Patient will label two or more emotions that they find the most difficult to experience 3. Patient will demonstrate positive conflict resolution skills through discussion and/or role plays  Summary of Patient Progress: Patient was present in group.  Patient was an active participant and engaged in group discussions.  Patient appeared to be really into the conversation on regulating anger and engaging in Opposite Action.  Patient was supportive and motivation to other group members.    Therapeutic Modalities:   Cognitive Behavioral Therapy Feelings Identification Dialectical Behavioral Therapy  Penni Homans, MSW, LCSW 09/20/2019 2:15 PM

## 2019-09-20 NOTE — Plan of Care (Signed)
  Problem: Safety: Goal: Periods of time without injury will increase Outcome: Progressing   

## 2019-09-20 NOTE — Progress Notes (Addendum)
   09/20/19 1430  Clinical Encounter Type  Visited With Patient  Visit Type Initial;Spiritual support;Social support;Behavioral Health  Referral From Chaplain  Consult/Referral To Chaplain  Visited Pt while rounding unit. Pt asked to talk to Ch. Pt informed Ch that he had a drug and alcohol problem. Pt said that he was going to rehab if everything workout. Pt asked for pray from Ch. I prayed for the patient. I will follow-up with the Pt.

## 2019-09-20 NOTE — Progress Notes (Signed)
Recreation Therapy Notes  Date: 09/20/2019  Time: 9:30 am  Location: Craft room   Behavioral response: Appropriate   Intervention Topic: Goals   Discussion/Intervention:  Group content on today was focused on goals. Patients described what goals are and how they define goals. Individuals expressed how they go about setting goals and reaching them. The group identified how important goals are and if they make short term goals to reach long term goals. Patients described how many goals they work on at a time and what affects them not reaching their goal. Individuals described how much time they put into planning and obtaining their goals. The group participated in the intervention "My Goal Board" and made personal goal boards to help them achieve their goal. Clinical Observations/Feedback:  Patient came to group and defined goals as achievements. He stated that his goal is to stay sober. Individual was social with peers and staff while participating in the intervention.   Russie Gulledge LRT/CTRS            Babette Stum 09/20/2019 12:02 PM

## 2019-09-20 NOTE — Progress Notes (Signed)
Patient alert and oriented x 4, affect is blunted, thoughtsare organized and coherent , patient appears receptive to staff and peers, he attended evening wrap up group and had evening snack.  Patient currently denies SI/HI/AVH no distress noted, patient complaint with COVID test, 15 minutes safety checks maintained will continue to monitor.closely.

## 2019-09-20 NOTE — Progress Notes (Signed)
Patient continues to have family conflicts that give him heightened anxiety. He reports coping the best he can. He reports loving his daughter and wanting to be a part of her life. Refused his Celaxa tonight stating he does not need and wanting to be off meds prior to leaving for treatment. He  Denied SI/HI/AVH endorses depression and anxiety that is situational related to his family dynamics with his daughter and girlfriend and current situation regarding his desire to be drug free.

## 2019-09-20 NOTE — Progress Notes (Signed)
Pt has gone to groups today. Pt also met with chaplain. Pt has been med compliant and social. The only time that pt was frustrated or irritated was when he was on the phone although he wasn't loud or a disruptive like last week. Pt is looking forward to discharging soon. Collier Bullock RN

## 2019-09-20 NOTE — Progress Notes (Signed)
[facility] MD Progress Note  09/20/2019 5:35 PM Nathan Koch  MRN:  [id number] Subjective: Follow-up for patient with substance abuse and mood disorder.  No new complaints. Principal Problem: Methamphetamine use ([place]) Diagnosis: Principal Problem:   Methamphetamine use ([place]) Active Problems:   MDD (major depressive disorder), single episode, severe with psychosis ([place])  Total Time spent with patient: 15 minutes  Past Psychiatric History: No past history other than substance abuse  Past Medical History:  Past Medical History:  Diagnosis Date  . Anxiety   . Asthma   . Depression   . Methamphetamine abuse [facility])     Past Surgical History:  Procedure Laterality Date  . CLOSED REDUCTION MANDIBLE N/A 07/02/2018   Procedure: CLOSED REDUCTION MANDIBULAR;  Surgeon: Margaretha Sheffield, MD;  Location: [facility] ORS;  Service: ENT;  Laterality: N/A;   Family History:  Family History  Problem Relation Age of Onset  . Asthma Mother   . Asthma Brother    Family Psychiatric  History: See previous Social History:  Social History   Substance and Sexual Activity  Alcohol Use Not Currently  . Alcohol/week: 7.0 standard drinks  . Types: 7 Standard drinks or equivalent per week     Social History   Substance and Sexual Activity  Drug Use Yes  . Types: Methamphetamines    Social History   Socioeconomic History  . Marital status: Single    Spouse name: Not on file  . Number of children: Not on file  . Years of education: Not on file  . Highest education level: Not on file  Occupational History  . Not on file  Tobacco Use  . Smoking status: Current Every Day Smoker    Packs/day: 1.00    Types: Cigarettes  . Smokeless tobacco: Never Used  . Tobacco comment: started smoking again December 2019  Substance and Sexual Activity  . Alcohol use: Not Currently    Alcohol/week: 7.0 standard drinks    Types: 7 Standard drinks or equivalent per week  . Drug use: Yes    Types: Methamphetamines  .  Sexual activity: Not on file  Other Topics Concern  . Not on file  Social History Narrative  . Not on file   Social Determinants of Health   Financial Resource Strain:   . Difficulty of Paying Living Expenses:   Food Insecurity:   . Worried About Charity fundraiser in the Last Year:   . Arboriculturist in the Last Year:   Transportation Needs:   . Film/video editor (Medical):   Marland Kitchen Lack of Transportation (Non-Medical):   Physical Activity:   . Days of Exercise per Week:   . Minutes of Exercise per Session:   Stress:   . Feeling of Stress :   Social Connections:   . Frequency of Communication with Friends and Family:   . Frequency of Social Gatherings with Friends and Family:   . Attends Religious Services:   . Active Member of Clubs or Organizations:   . Attends Archivist Meetings:   Marland Kitchen Marital Status:    Additional Social History:                         Sleep: Good  Appetite:  Good  Current Medications: Current Facility-Administered Medications  Medication Dose Route Frequency Provider Last Rate Last Admin  . acetaminophen (TYLENOL) tablet 650 mg  650 mg Oral Q6H PRN [name], NP      .  albuterol (VENTOLIN HFA) 108 (90 Base) MCG/ACT inhaler 2 puff  2 puff Inhalation Q4H PRN Aleczander Fandino, Jackquline Denmark, MD   2 puff at 09/18/19 2043  . alum & mag hydroxide-simeth (MAALOX/MYLANTA) 200-200-20 MG/5ML suspension 30 mL  30 mL Oral Q4H PRN Gillermo Murdoch, NP      . citalopram (CELEXA) tablet 20 mg  20 mg Oral QHS Taleya Whitcher, Jackquline Denmark, MD   20 mg at 09/19/19 2100  . hydrOXYzine (ATARAX/VISTARIL) tablet 25 mg  25 mg Oral TID PRN Thalia Party, MD   25 mg at 09/18/19 2041  . magnesium hydroxide (MILK OF MAGNESIA) suspension 30 mL  30 mL Oral Daily PRN Gillermo Murdoch, NP      . nicotine (NICODERM CQ - dosed in mg/24 hours) patch 21 mg  21 mg Transdermal Daily Kelby Adell, Jackquline Denmark, MD   21 mg at 09/20/19 0086    Lab Results: No results found for this or  any previous visit (from the past 48 hour(s)).  Blood Alcohol level:  Lab Results  Component Value Date   ETH <10 09/13/2019   ETH <10 08/16/2019    Metabolic Disorder Labs: No results found for: HGBA1C, MPG No results found for: PROLACTIN Lab Results  Component Value Date   CHOL 156 07/11/2014   TRIG 178 (A) 07/11/2014   HDL 46 07/11/2014   LDLCALC 74 07/11/2014    Physical Findings: AIMS:  , ,  ,  ,    CIWA:    COWS:     Musculoskeletal: Strength & Muscle Tone: within normal limits Gait & Station: normal Patient leans: N/A  Psychiatric Specialty Exam: Physical Exam  Nursing note and vitals reviewed. Constitutional: He appears well-developed and well-nourished.  HENT:  Head: Normocephalic and atraumatic.  Eyes: Pupils are equal, round, and reactive to light. Conjunctivae are normal.  Cardiovascular: Regular rhythm and normal heart sounds.  Respiratory: Effort normal. No respiratory distress.  GI: Soft.  Musculoskeletal:        General: Normal range of motion.     Cervical back: Normal range of motion.  Neurological: He is alert.  Skin: Skin is warm and dry.  Psychiatric: He has a normal mood and affect. His behavior is normal. Judgment and thought content normal.    Review of Systems  Constitutional: Negative.   HENT: Negative.   Eyes: Negative.   Respiratory: Negative.   Cardiovascular: Negative.   Gastrointestinal: Negative.   Musculoskeletal: Negative.   Skin: Negative.   Neurological: Negative.   Psychiatric/Behavioral: Negative.     Blood pressure 108/62, pulse 63, temperature 98.2 F (36.8 C), temperature source Oral, resp. rate 18, height 5\' 9"  (1.753 m), weight 82.1 kg, SpO2 100 %.Body mass index is 26.73 kg/m.  General Appearance: Casual  Eye Contact:  Good  Speech:  Clear and Coherent  Volume:  Normal  Mood:  Euthymic  Affect:  Congruent  Thought Process:  Goal Directed  Orientation:  Full (Time, Place, and Person)  Thought Content:   Logical  Suicidal Thoughts:  No  Homicidal Thoughts:  No  Memory:  Immediate;   Fair  Judgement:  Fair  Insight:  Fair  Psychomotor Activity:  Normal  Concentration:  Concentration: Fair  Recall:  of Knowledge:  Fair  Language:  Fair  Akathisia:  No  Handed:  Right  AIMS (if indicated):     Assets:  Communication Skills  ADL's:  Intact  Cognition:  WNL  Sleep:  Number of Hours: 7.5  Treatment Plan Summary: Plan No change to treatment plan.  [facility]  Mordecai Rasmussen, MD 09/20/2019, 5:35 PM

## 2019-09-21 LAB — SARS CORONAVIRUS 2 BY RT PCR (HOSPITAL ORDER, PERFORMED IN ~~LOC~~ HOSPITAL LAB): SARS Coronavirus 2: NEGATIVE

## 2019-09-21 NOTE — Progress Notes (Signed)
BRIEF PHARMACY NOTE   This patient attended and participated in Medication Management Group counseling led by [facility] staff pharmacist.  This interactive class reviews basic information about prescription medications and education on personal responsibility in medication management.  The class also includes general knowledge of 3 main classes of behavioral medications, including antipsychotics, antidepressants, and mood stabilizers.     Patient behavior was appropriate for group setting.   Educational materials sourced from:  "Medication Do's and Don'ts" from [email or web address]   "Mental Health Medications" from [facility] of Mental Health [email or web address].[email or web address]#[email or web address]    Albina Billet, PharmD, BCPS Clinical Pharmacist 09/21/2019 2:57 PM

## 2019-09-21 NOTE — Progress Notes (Signed)
Recreation Therapy Notes  Date: 09/21/2019  Time: 9:30 am  Location: Room 21   Behavioral response: Appropriate   Intervention Topic: Animal Assisted Therapy   Discussion/Intervention:  Animal Assisted Therapy took place today during group.  Animal Assisted Therapy is the planned inclusion of an animal in a patient's treatment plan. The patients were able to engage in therapy with an animal during group. Participants were educated on what a service dog is and the different between a support dog and a service dog. Patient were informed on the many animal needs there are and how their needs are similar. Individuals were enlightened on the process to get a service animal or support animal. Patients got the opportunity to pet the animal and were offered emotional support from the animal and staff.  Clinical Observations/Feedback:  Patient came to group and was on topic and was focused on what peers and staff had to say. Participant shared their experiences and history with animals. Individual was social with peers, staff and animal while participating in group.  Jon Lall LRT/CTRS         Asaph Serena 09/21/2019 2:32 PM

## 2019-09-21 NOTE — Progress Notes (Signed)
Pt is alert and oriented to person, place, time and situation. Pt is calm, cooperative, isolates in her room, denies suicidal and homicidal ideation, denies hallucinations, reports some feelings of depression and anxiety but reports it has lessened since admission. No distress noted, none reported, pt voices no complaints. Will continue to monitor pt per Q15 minute face checks and monitor for safety and progress.  Called [facility] awaiting assistance for covid testing for placement.

## 2019-09-21 NOTE — BHH Group Notes (Signed)
Feelings Around Relapse 09/21/2019 9:30AM/1PM  Type of Therapy and Topic:  Group Therapy:  Feelings around Relapse and Recovery  Participation Level:  Active   Description of Group:    Patients in this group will discuss emotions they experience before and after a relapse. They will process how experiencing these feelings, or avoidance of experiencing them, relates to having a relapse. Facilitator will guide patients to explore emotions they have related to recovery. Patients will be encouraged to process which emotions are more powerful. They will be guided to discuss the emotional reaction significant others in their lives may have to patients' relapse or recovery. Patients will be assisted in exploring ways to respond to the emotions of others without this contributing to a relapse.  Therapeutic Goals: 1. Patient will identify two or more emotions that lead to a relapse for them 2. Patient will identify two emotions that result when they relapse 3. Patient will identify two emotions related to recovery 4. Patient will demonstrate ability to communicate their needs through discussion and/or role plays   Summary of Patient Progress: Actively and appropriately engaged in the group. Patient was able to provide support and validation to other group members.Patient practiced active listening when interacting with the facilitator and other group members. Patient reports he is familiar with the subject matter from having attended [facility]. Patient demonstrated very good understanding of topic and interacted appropriately with group members.      Therapeutic Modalities:   Cognitive Behavioral Therapy Solution-Focused Therapy Assertiveness Training Relapse Prevention Therapy   Suzan Slick, LCSW 09/21/2019 3:34 PM

## 2019-09-21 NOTE — Progress Notes (Signed)
   09/21/19 1510  Clinical Encounter Type  Visited With Patient  Visit Type Follow-up;Spiritual support;Social support;Behavioral Health  Referral From Chaplain  Consult/Referral To Chaplain  Visited with Pt while rounding unit in [place]. Pt told me that he had gotten into rehab. I was happy for Pt. PT is leaving today. I prayed for the Pt.

## 2019-09-21 NOTE — Progress Notes (Signed)
[facility] MD Progress Note  09/21/2019 11:38 AM Nathan Koch  MRN:  [id number] Subjective: Patient seen chart reviewed.  Follow-up for this 29 year old man with mood instability and substance abuse.  Patient started this morning by telling me that he thinks he would like to go home now instead of rehab.  We sat down and talk with him and his reason for it is that his girlfriend is at risk of being arrested which could put the baby into a unclear situation.  Patient thinks if he were out of the hospital even if he were staying in a homeless shelter he would be able to take care of the baby.  We talked about how honestly this sounds like a somewhat farfetched situation on several accounts and how important it would be for him to try and really get his drug abuse under control.  He acknowledged all that and talked a bit about his feelings of responsibility towards the baby as well as his ongoing anxiety about himself.  No new physical complaints. Principal Problem: Methamphetamine use ([place]) Diagnosis: Principal Problem:   Methamphetamine use ([place]) Active Problems:   MDD (major depressive disorder), single episode, severe with psychosis ([place])  Total Time spent with patient: 30 minutes  Past Psychiatric History: Past history of substance abuse  Past Medical History:  Past Medical History:  Diagnosis Date  . Anxiety   . Asthma   . Depression   . Methamphetamine abuse [facility])     Past Surgical History:  Procedure Laterality Date  . CLOSED REDUCTION MANDIBLE N/A 07/02/2018   Procedure: CLOSED REDUCTION MANDIBULAR;  Surgeon: Margaretha Sheffield, MD;  Location: [facility] ORS;  Service: ENT;  Laterality: N/A;   Family History:  Family History  Problem Relation Age of Onset  . Asthma Mother   . Asthma Brother    Family Psychiatric  History: See previous Social History:  Social History   Substance and Sexual Activity  Alcohol Use Not Currently  . Alcohol/week: 7.0 standard drinks  . Types: 7 Standard drinks  or equivalent per week     Social History   Substance and Sexual Activity  Drug Use Yes  . Types: Methamphetamines    Social History   Socioeconomic History  . Marital status: Single    Spouse name: Not on file  . Number of children: Not on file  . Years of education: Not on file  . Highest education level: Not on file  Occupational History  . Not on file  Tobacco Use  . Smoking status: Current Every Day Smoker    Packs/day: 1.00    Types: Cigarettes  . Smokeless tobacco: Never Used  . Tobacco comment: started smoking again December 2019  Substance and Sexual Activity  . Alcohol use: Not Currently    Alcohol/week: 7.0 standard drinks    Types: 7 Standard drinks or equivalent per week  . Drug use: Yes    Types: Methamphetamines  . Sexual activity: Not on file  Other Topics Concern  . Not on file  Social History Narrative  . Not on file   Social Determinants of Health   Financial Resource Strain:   . Difficulty of Paying Living Expenses:   Food Insecurity:   . Worried About Charity fundraiser in the Last Year:   . Arboriculturist in the Last Year:   Transportation Needs:   . Film/video editor (Medical):   Marland Kitchen Lack of Transportation (Non-Medical):   Physical Activity:   . Days  of Exercise per Week:   . Minutes of Exercise per Session:   Stress:   . Feeling of Stress :   Social Connections:   . Frequency of Communication with Friends and Family:   . Frequency of Social Gatherings with Friends and Family:   . Attends Religious Services:   . Active Member of Clubs or Organizations:   . Attends Banker Meetings:   Marland Kitchen Marital Status:    Additional Social History:                         Sleep: Fair  Appetite:  Fair  Current Medications: Current Facility-Administered Medications  Medication Dose Route Frequency Provider Last Rate Last Admin  . acetaminophen (TYLENOL) tablet 650 mg  650 mg Oral Q6H PRN Gillermo Murdoch, NP       . albuterol (VENTOLIN HFA) 108 (90 Base) MCG/ACT inhaler 2 puff  2 puff Inhalation Q4H PRN Ileanna Gemmill, Jackquline Denmark, MD   2 puff at 09/18/19 2043  . alum & mag hydroxide-simeth (MAALOX/MYLANTA) 200-200-20 MG/5ML suspension 30 mL  30 mL Oral Q4H PRN Gillermo Murdoch, NP      . citalopram (CELEXA) tablet 20 mg  20 mg Oral QHS Hannibal Skalla, Jackquline Denmark, MD   20 mg at 09/19/19 2100  . hydrOXYzine (ATARAX/VISTARIL) tablet 25 mg  25 mg Oral TID PRN Thalia Party, MD   25 mg at 09/18/19 2041  . magnesium hydroxide (MILK OF MAGNESIA) suspension 30 mL  30 mL Oral Daily PRN Gillermo Murdoch, NP      . nicotine (NICODERM CQ - dosed in mg/24 hours) patch 21 mg  21 mg Transdermal Daily Kalab Camps, Jackquline Denmark, MD   21 mg at 09/20/19 2774    Lab Results: No results found for this or any previous visit (from the past 48 hour(s)).  Blood Alcohol level:  Lab Results  Component Value Date   ETH <10 09/13/2019   ETH <10 08/16/2019    Metabolic Disorder Labs: No results found for: HGBA1C, MPG No results found for: PROLACTIN Lab Results  Component Value Date   CHOL 156 07/11/2014   TRIG 178 (A) 07/11/2014   HDL 46 07/11/2014   LDLCALC 74 07/11/2014    Physical Findings: AIMS:  , ,  ,  ,    CIWA:    COWS:     Musculoskeletal: Strength & Muscle Tone: decreased Gait & Station: normal Patient leans: N/A  Psychiatric Specialty Exam: Physical Exam  Nursing note and vitals reviewed. Constitutional: He appears well-developed and well-nourished.  HENT:  Head: Normocephalic and atraumatic.  Eyes: Pupils are equal, round, and reactive to light. Conjunctivae are normal.  Cardiovascular: Regular rhythm and normal heart sounds.  Respiratory: Effort normal.  GI: Soft.  Musculoskeletal:        General: Normal range of motion.     Cervical back: Normal range of motion.  Neurological: He is alert.  Skin: Skin is warm and dry.  Psychiatric: He has a normal mood and affect. His behavior is normal. Judgment and thought  content normal.    Review of Systems  Constitutional: Negative.   HENT: Negative.   Eyes: Negative.   Respiratory: Negative.   Cardiovascular: Negative.   Gastrointestinal: Negative.   Musculoskeletal: Negative.   Skin: Negative.   Neurological: Negative.   Psychiatric/Behavioral: Negative.     Blood pressure 105/69, pulse 64, temperature 97.8 F (36.6 C), temperature source Oral, resp. rate 18, height 5\' 9"  (1.753 m),  weight 82.1 kg, SpO2 100 %.Body mass index is 26.73 kg/m.  General Appearance: Casual  Eye Contact:  Fair  Speech:  Slow  Volume:  Decreased  Mood:  Dysphoric  Affect:  Constricted  Thought Process:  Goal Directed  Orientation:  Full (Time, Place, and Person)  Thought Content:  Logical  Suicidal Thoughts:  No  Homicidal Thoughts:  No  Memory:  Immediate;   Fair Recent;   Fair Remote;   Fair  Judgement:  Fair  Insight:  Fair  Psychomotor Activity:  Normal  Concentration:  Concentration: Fair  Recall:  [employer] of Knowledge:  Fair  Language:  Fair  Akathisia:  No  Handed:  Right  AIMS (if indicated):     Assets:  Desire for Improvement  ADL's:  Intact  Cognition:  WNL  Sleep:  Number of Hours: 5.5     Treatment Plan Summary: Daily contact with patient to assess and evaluate symptoms and progress in treatment, Medication management and Plan After some conversation about it he did decide to go forward with the plan to go to the alcohol and drug abuse treatment center.  I am going to order his COVID test as soon as possible so that we can be ready for that for tomorrow  Mordecai Rasmussen, MD 09/21/2019, 11:38 AM

## 2019-09-22 MED ORDER — CITALOPRAM HYDROBROMIDE 20 MG PO TABS: 20.0000 mg | ORAL_TABLET | Freq: Every day | ORAL | 1 refills | Status: DC

## 2019-09-22 MED ORDER — ALBUTEROL SULFATE HFA 108 (90 BASE) MCG/ACT IN AERS: 2.0000 | INHALATION_SPRAY | RESPIRATORY_TRACT | 1 refills | Status: DC | PRN

## 2019-09-22 NOTE — Progress Notes (Signed)
  [facility] Adult Case Management Discharge Plan :  Will you be returning to the same living situation after discharge:  No. At discharge, do you have transportation home?: Yes,  CSW will assist with transportaiton needs. Do you have the ability to pay for your medications: No.  Release of information consent forms completed and in the chart;  Patient's signature needed at discharge.  Patient to Follow up at: Follow-up Information    [facility].   Why: You are scheduled for 1PM intake.  Thanks! Contact information: [address] [place] [address] [phone]           Next level of care provider has access to [facility] Link:no  Safety Planning and Suicide Prevention discussed: Yes,  SPE completed with the patient and pt's fiance.     Has patient been referred to the Quitline?: Patient refused referral  Patient has been referred for addiction treatment: Yes  Harden Mo, LCSW 09/22/2019, 9:45 AM

## 2019-09-22 NOTE — Progress Notes (Signed)
Recreation Therapy Notes  INPATIENT RECREATION TR PLAN  Patient Details Name: Nathan Koch MRN: [id number] DOB: 04-09-1991 Today's Date: 09/22/2019  Rec Therapy Plan Is patient appropriate for Therapeutic Recreation?: Yes Treatment times per week: at least 3 Estimated Length of Stay: 5-7 days TR Treatment/Interventions: Group participation (Comment)  Discharge Criteria Pt will be discharged from therapy if:: Discharged Treatment plan/goals/alternatives discussed and agreed upon by:: Patient/family  Discharge Summary Short term goals set: Patient will engage in groups without prompting or encouragement from LRT x3 group sessions within 5 recreation therapy group sessions Short term goals met: Complete Progress toward goals comments: Groups attended Which groups?: Goal setting, AAA/T, Other (Comment)(Happiness) Reason goals not met: N/A Therapeutic equipment acquired: N/A Reason patient discharged from therapy: Discharge from hospital Pt/family agrees with progress & goals achieved: Yes Date patient discharged from therapy: 09/22/19   Chelsia Serres 09/22/2019, 1:23 PM

## 2019-09-22 NOTE — Plan of Care (Signed)
D: Pt alert and oriented x 4. Pt rates depression 7/10, hopelessness 7/10, and anxiety 7/10.Pt goal: no goal reported by pt. Pt reports energy leave as normal and concentration as being good Pt reports sleep last night as being good. Pt did receive medications for sleep and did find them helpful. Pt reports experiencing stomach pain, denies wanting any pain management for it, reports maybe r/t anxiety. Pt denies experiencing any SI/HI, or AVH at this time. Pt denies wanting Nicoderm patch this morning, MD notified. Pt denies all with exception to Anxiety being 7/10 and stomach ache for this writer this morning upon approach. Pt reports he does not want any medication management.  A: Scheduled medications administered to pt, per MD orders. Support and encouragement provided. Frequent verbal contact made. Routine safety checks conducted q15 minutes.   R: No adverse drug reactions noted. Pt verbally contracts for safety at this time. Pt complaint with medications and treatment plan. Pt interacts well with others on the unit. Pt remains safe at this time. Will continue to monitor.   Problem: Education: Goal: Knowledge of [place] [facility] Education information/materials will improve Outcome: Progressing Goal: Verbalization of understanding the information provided will improve Outcome: Progressing   Problem: Activity: Goal: Interest or engagement in activities will improve Outcome: Progressing   Problem: Coping: Goal: Ability to verbalize frustrations and anger appropriately will improve Outcome: Progressing Goal: Ability to demonstrate self-control will improve Outcome: Progressing   Problem: Safety: Goal: Periods of time without injury will increase Outcome: Progressing

## 2019-09-22 NOTE — Progress Notes (Signed)
D: Pt alert and oriented x 4. Pt denies experiencing any pain, SI/HI, or AVH at this time. Pt reports he will be able to keep himself safe when he goes to [facility].  A: Pt received discharge and medication education/information. Pt belongings were returned and signed for at this time to include discharge instructions and prescripts.   R: Pt verbalized understanding of discharge and medication education/information.  Pt escorted to [facility] front lobby where transport picked pt up for transport to [facility].

## 2019-09-22 NOTE — Plan of Care (Signed)
  Problem: Group Participation Goal: STG - Patient will engage in groups without prompting or encouragement from LRT x3 group sessions within 5 recreation therapy group sessions Description: STG - Patient will engage in groups without prompting or encouragement from LRT x3 group sessions within 5 recreation therapy group sessions Outcome: Completed/Met

## 2019-09-22 NOTE — BHH Suicide Risk Assessment (Signed)
[facility] Discharge Suicide Risk Assessment   Principal Problem: Methamphetamine use [facility]) Discharge Diagnoses: Principal Problem:   Methamphetamine use ([name]) Active Problems:   MDD (major depressive disorder), single episode, severe with psychosis ([name])   Total Time spent with patient: 30 minutes  Musculoskeletal: Strength & Muscle Tone: within normal limits Gait & Station: normal Patient leans: N/A  Psychiatric Specialty Exam: Review of Systems  Constitutional: Negative.   HENT: Negative.   Eyes: Negative.   Respiratory: Negative.   Cardiovascular: Negative.   Gastrointestinal: Negative.   Musculoskeletal: Negative.   Skin: Negative.   Neurological: Negative.   Psychiatric/Behavioral: Negative.     Blood pressure 109/65, pulse (!) 56, temperature 97.8 F (36.6 C), temperature source Oral, resp. rate 18, height 5\' 9"  (1.753 m), weight 82.1 kg, SpO2 100 %.Body mass index is 26.73 kg/m.  General Appearance: Casual  Eye Contact::  Good  Speech:  Clear and Coherent409  Volume:  Normal  Mood:  Euthymic  Affect:  Congruent  Thought Process:  Goal Directed  Orientation:  Full (Time, Place, and Person)  Thought Content:  Logical  Suicidal Thoughts:  No  Homicidal Thoughts:  No  Memory:  Immediate;   Fair Recent;   Fair Remote;   Fair  Judgement:  Fair  Insight:  Fair  Psychomotor Activity:  Normal  Concentration:  Fair  Recall:  [phone] of Knowledge:Fair  Language: Fair  Akathisia:  No  Handed:  Right  AIMS (if indicated):     Assets:  Desire for Improvement  Sleep:  Number of Hours: 7.45  Cognition: WNL  ADL's:  Intact   Mental Status Per Nursing Assessment::   On Admission:  NA  Demographic Factors:  Male and Low socioeconomic status  Loss Factors: Financial problems/change in socioeconomic status  Historical Factors: Impulsivity  Risk Reduction Factors:   Responsible for children under 37 years of age, Sense of responsibility to family and Positive  social support  Continued Clinical Symptoms:  Depression:   Impulsivity Alcohol/Substance Abuse/Dependencies  Cognitive Features That Contribute To Risk:  None    Suicide Risk:  Minimal: No identifiable suicidal ideation.  Patients presenting with no risk factors but with morbid ruminations; may be classified as minimal risk based on the severity of the depressive symptoms    Plan Of Care/Follow-up recommendations:  Activity:  Activity as tolerated Diet:  Regular diet Other:  Follow-up with alcohol and drug abuse treatment center  15, MD 09/22/2019, 9:25 AM

## 2019-09-22 NOTE — Discharge Summary (Signed)
Physician Discharge Summary Note  Patient:  Nathan Koch is an 29 y.o., male MRN:  [id number] DOB:  07-20-1990 Patient phone:  [phone] (home)  Patient address:   [address] [place] [address],  Total Time spent with patient: 30 minutes  Date of Admission:  09/14/2019 Date of Discharge: 09/22/2019  Reason for Admission: Admitted because of mood instability agitation substance abuse  Principal Problem: Methamphetamine use [facility]) Discharge Diagnoses: Principal Problem:   Methamphetamine use ([name]) Active Problems:   MDD (major depressive disorder), single episode, severe with psychosis ([name])   Past Psychiatric History: History of substance abuse and mood instability and anxiety  Past Medical History:  Past Medical History:  Diagnosis Date  . Anxiety   . Asthma   . Depression   . Methamphetamine abuse [facility])     Past Surgical History:  Procedure Laterality Date  . CLOSED REDUCTION MANDIBLE N/A 07/02/2018   Procedure: CLOSED REDUCTION MANDIBULAR;  Surgeon: Vernie Murders, MD;  Location: [facility] ORS;  Service: ENT;  Laterality: N/A;   Family History:  Family History  Problem Relation Age of Onset  . Asthma Mother   . Asthma Brother    Family Psychiatric  History: See previous.  No significant history. Social History:  Social History   Substance and Sexual Activity  Alcohol Use Not Currently  . Alcohol/week: 7.0 standard drinks  . Types: 7 Standard drinks or equivalent per week     Social History   Substance and Sexual Activity  Drug Use Yes  . Types: Methamphetamines    Social History   Socioeconomic History  . Marital status: Single    Spouse name: Not on file  . Number of children: Not on file  . Years of education: Not on file  . Highest education level: Not on file  Occupational History  . Not on file  Tobacco Use  . Smoking status: Current Every Day Smoker    Packs/day: 1.00    Types: Cigarettes  . Smokeless tobacco: Never Used  . Tobacco comment:  started smoking again December 2019  Substance and Sexual Activity  . Alcohol use: Not Currently    Alcohol/week: 7.0 standard drinks    Types: 7 Standard drinks or equivalent per week  . Drug use: Yes    Types: Methamphetamines  . Sexual activity: Not on file  Other Topics Concern  . Not on file  Social History Narrative  . Not on file   Social Determinants of Health   Financial Resource Strain:   . Difficulty of Paying Living Expenses:   Food Insecurity:   . Worried About Programme researcher, broadcasting/film/video in the Last Year:   . Barista in the Last Year:   Transportation Needs:   . Freight forwarder (Medical):   Marland Kitchen Lack of Transportation (Non-Medical):   Physical Activity:   . Days of Exercise per Week:   . Minutes of Exercise per Session:   Stress:   . Feeling of Stress :   Social Connections:   . Frequency of Communication with Friends and Family:   . Frequency of Social Gatherings with Friends and Family:   . Attends Religious Services:   . Active Member of Clubs or Organizations:   . Attends Banker Meetings:   Marland Kitchen Marital Status:     Hospital Course: Patient was kept on 15-minute checks.  Initially he was irritable and grumpy and dysphoric but cleared up within a day and a half.  Had  to get a lot of sleep to recover from methamphetamine binge.  Once he did he was very appropriate on the unit.  Attended groups and participated appropriately.  Showed good insight and judgment.  Medical treatment with citalopram and as needed for asthma.  Patient did not display any violent dangerous or aggressive behavior.  He was agreeable to referral to outpatient treatment as well as to a brief inpatient stay at rehab.  He has been accepted to the alcohol and drug abuse treatment center and will be discharged to that program today.  Physical Findings: AIMS:  , ,  ,  ,    CIWA:    COWS:     Musculoskeletal: Strength & Muscle Tone: within normal limits Gait & Station:  normal Patient leans: N/A  Psychiatric Specialty Exam: Physical Exam  Nursing note and vitals reviewed. Constitutional: He appears well-developed and well-nourished.  HENT:  Head: Normocephalic and atraumatic.  Eyes: Pupils are equal, round, and reactive to light. Conjunctivae are normal.  Cardiovascular: Regular rhythm and normal heart sounds.  Respiratory: Effort normal.  GI: Soft.  Musculoskeletal:        General: Normal range of motion.     Cervical back: Normal range of motion.  Neurological: He is alert.  Skin: Skin is warm and dry.  Psychiatric: He has a normal mood and affect. His behavior is normal. Judgment and thought content normal.    Review of Systems  Constitutional: Negative.   HENT: Negative.   Eyes: Negative.   Respiratory: Negative.   Cardiovascular: Negative.   Gastrointestinal: Negative.   Musculoskeletal: Negative.   Skin: Negative.   Neurological: Negative.   Psychiatric/Behavioral: Negative.     Blood pressure 109/65, pulse (!) 56, temperature 97.8 F (36.6 C), temperature source Oral, resp. rate 18, height 5\' 9"  (1.753 m), weight 82.1 kg, SpO2 100 %.Body mass index is 26.73 kg/m.  General Appearance: Casual  Eye Contact:  Good  Speech:  Clear and Coherent  Volume:  Normal  Mood:  Euthymic  Affect:  Congruent  Thought Process:  Goal Directed  Orientation:  Full (Time, Place, and Person)  Thought Content:  Logical  Suicidal Thoughts:  No  Homicidal Thoughts:  No  Memory:  Immediate;   Fair Recent;   Fair Remote;   Fair  Judgement:  Fair  Insight:  Fair  Psychomotor Activity:  Normal  Concentration:  Concentration: Fair  Recall:  Fair  Fund of Knowledge:  Fair  Language:  Fair  Akathisia:  No  Handed:  Right  AIMS (if indicated):     Assets:  Desire for Improvement Physical Health Resilience  ADL's:  Intact  Cognition:  WNL  Sleep:  Number of Hours: 7.45        Has this patient used any form of tobacco in the last 30 days?  (Cigarettes, Smokeless Tobacco, Cigars, and/or Pipes) Yes, No  Blood Alcohol level:  Lab Results  Component Value Date   ETH <10 09/13/2019   ETH <10 08/16/2019    Metabolic Disorder Labs:  No results found for: HGBA1C, MPG No results found for: PROLACTIN Lab Results  Component Value Date   CHOL 156 07/11/2014   TRIG 178 (A) 07/11/2014   HDL 46 07/11/2014   LDLCALC 74 07/11/2014    See Psychiatric Specialty Exam and Suicide Risk Assessment completed by Attending Physician prior to discharge.  Discharge destination:  [facility]  Is patient on multiple antipsychotic therapies at discharge:  No   Has Patient had three  or more failed trials of antipsychotic monotherapy by history:  No  Recommended Plan for Multiple Antipsychotic Therapies: NA  Discharge Instructions    Diet - low sodium heart healthy   Complete by: As directed    Increase activity slowly   Complete by: As directed      Allergies as of 09/22/2019      Reactions   Ibuprofen Swelling      Medication List    STOP taking these medications   cyclobenzaprine 5 MG tablet Commonly known as: FLEXERIL   Fluticasone-Salmeterol 250-50 MCG/DOSE Aepb Commonly known as: Advair Diskus   lidocaine 2 % solution Commonly known as: XYLOCAINE   ondansetron 4 MG disintegrating tablet Commonly known as: Zofran ODT     TAKE these medications     Indication  albuterol 108 (90 Base) MCG/ACT inhaler Commonly known as: VENTOLIN HFA Inhale 2 puffs into the lungs every 4 (four) hours as needed for wheezing or shortness of breath.  Indication: Asthma   citalopram 20 MG tablet Commonly known as: CELEXA Take 1 tablet (20 mg total) by mouth at bedtime.  Indication: Depression, Generalized Anxiety Disorder        Follow-up recommendations:  Activity:  Activity as tolerated Diet:  Regular diet Other:  Follow-up with alcohol and drug abuse treatment center  Comments: Prescriptions provided at discharge  Signed: Alethia Berthold, MD 09/22/2019, 9:28 AM

## 2019-09-22 NOTE — Progress Notes (Signed)
Recreation Therapy Notes   Date: 09/22/2019  Time: 9:30 am  Location: Craft room  Behavioral response: Appropriate  Intervention Topic: Happiness    Discussion/Intervention:  Group content today was focused on Happiness. The group defined happiness and described where happiness comes from. Individuals identified what makes them happy and how they go about making others happy. Patients expressed things that stop them from being happy and ways they can improve their happiness. The group stated reasons why it is important to be happy. The group participated in the intervention "My Happiness", where they had a chance to identify and express things that make them happy. Clinical Observations/Feedback:  Patient came to group late due to unknown reasons. Individual was social with peers and staff while participating in the intervention.  Tin Engram LRT/CTRS         Elizebath Wever 09/22/2019 1:13 PM

## 2019-10-09 ENCOUNTER — Other Ambulatory Visit: Payer: Self-pay

## 2019-10-09 ENCOUNTER — Emergency Department
Admission: EM | Admit: 2019-10-09 | Discharge: 2019-10-10 | Disposition: A | Discharge status: Other Acute Inpatient Hospital | Payer: Self-pay | Attending: Emergency Medicine | Admitting: Emergency Medicine

## 2019-10-09 DIAGNOSIS — F332 Major depressive disorder, recurrent severe without psychotic features: Secondary | ICD-10-CM | POA: Insufficient documentation

## 2019-10-09 DIAGNOSIS — F17210 Nicotine dependence, cigarettes, uncomplicated: Secondary | ICD-10-CM | POA: Insufficient documentation

## 2019-10-09 DIAGNOSIS — F4310 Post-traumatic stress disorder, unspecified: Secondary | ICD-10-CM | POA: Insufficient documentation

## 2019-10-09 DIAGNOSIS — F1520 Other stimulant dependence, uncomplicated: Secondary | ICD-10-CM | POA: Diagnosis present

## 2019-10-09 DIAGNOSIS — F1510 Other stimulant abuse, uncomplicated: Secondary | ICD-10-CM | POA: Insufficient documentation

## 2019-10-09 DIAGNOSIS — Z20822 Contact with and (suspected) exposure to covid-19: Secondary | ICD-10-CM | POA: Insufficient documentation

## 2019-10-09 DIAGNOSIS — F323 Major depressive disorder, single episode, severe with psychotic features: Secondary | ICD-10-CM | POA: Diagnosis present

## 2019-10-09 DIAGNOSIS — J45909 Unspecified asthma, uncomplicated: Secondary | ICD-10-CM | POA: Diagnosis present

## 2019-10-09 DIAGNOSIS — F333 Major depressive disorder, recurrent, severe with psychotic symptoms: Secondary | ICD-10-CM | POA: Diagnosis present

## 2019-10-09 DIAGNOSIS — R45851 Suicidal ideations: Secondary | ICD-10-CM | POA: Insufficient documentation

## 2019-10-09 LAB — CBC
HCT: 41.1 % (ref 39.0–52.0)
Hemoglobin: 13.7 g/dL (ref 13.0–17.0)
MCH: 28.4 pg (ref 26.0–34.0)
MCHC: 33.3 g/dL (ref 30.0–36.0)
MCV: 85.1 fL (ref 80.0–100.0)
Platelets: 335 10*3/uL (ref 150–400)
RBC: 4.83 MIL/uL (ref 4.22–5.81)
RDW: 13 % (ref 11.5–15.5)
WBC: 11.5 10*3/uL — ABNORMAL HIGH (ref 4.0–10.5)
nRBC: 0 % (ref 0.0–0.2)

## 2019-10-09 LAB — COMPREHENSIVE METABOLIC PANEL
ALT: 13 U/L (ref 0–44)
AST: 23 U/L (ref 15–41)
Albumin: 4.3 g/dL (ref 3.5–5.0)
Alkaline Phosphatase: 56 U/L (ref 38–126)
Anion gap: 9 (ref 5–15)
BUN: 11 mg/dL (ref 6–20)
CO2: 25 mmol/L (ref 22–32)
Calcium: 9.1 mg/dL (ref 8.9–10.3)
Chloride: 105 mmol/L (ref 98–111)
Creatinine, Ser: 1.02 mg/dL (ref 0.61–1.24)
GFR calc Af Amer: >60 mL/min (ref >60)
GFR calc non Af Amer: >60 mL/min (ref >60)
Glucose, Bld: 110 mg/dL — ABNORMAL HIGH (ref 70–99)
Potassium: 3.9 mmol/L (ref 3.5–5.1)
Sodium: 139 mmol/L (ref 135–145)
Total Bilirubin: 1 mg/dL (ref 0.3–1.2)
Total Protein: 7.7 g/dL (ref 6.5–8.1)

## 2019-10-09 NOTE — ED Notes (Signed)
Pt reports his friend recently died of a drug overdose and he was kicked out of his home today. Pt states he is 26 days sober and on the waiting list at rehab facility (possibly [facility] on [address] Pt states he has a lot on his mind, "I've got a 78 month old and you know I just keep thinking that could've been me. I was on some same shit he was." Pt appears calm, cooperative, and sad when speaking about feelings. Pt states "I haven't had thoughts of hurting anyone else. Just myself." Pt denies making a plan or any action taken to harm self. Pt denies HI, hallucinations or delusions.

## 2019-10-09 NOTE — ED Notes (Signed)
Pt belongings include: 1 black baseball cap, 1 pr sunglasses, 1 silver-colored chain, 1 black facemask, 1 blue-colored silicone medical bracelet, 1 white t-shirt, 1 pr blue denim jeans, 1 black wallet, 1 pr nail clippers, 1 pr dark blue shoes, 1 pr black socks, 1 cell phone. Pt's belongings labeled and bagged per policy.

## 2019-10-09 NOTE — ED Provider Notes (Signed)
[facility] Emergency Department Provider Note  ____________________________________________   First MD Initiated Contact with Patient 10/09/19 2340     (approximate)  I have reviewed the triage vital signs and the nursing notes.   HISTORY  Chief Complaint Mental Health Problem    HPI Nathan Koch is a 29 y.o. male below list of previous medical conditions including depression and methamphetamine abuse presents to the emergency department suicidal ideation with plan and request for detox from methamphetamines.  Patient states last meth use was 24 hours ago.  Patient states that he was recently in detox a month ago.        Past Medical History:  Diagnosis Date  . Anxiety   . Asthma   . Depression   . Methamphetamine abuse [facility])     Patient Active Problem List   Diagnosis Date Noted  . Methamphetamine use ([place]) 09/14/2019  . MDD (major depressive disorder), recurrent, severe, with psychosis ([place]) 09/14/2019  . MDD (major depressive disorder), single episode, severe with psychosis ([place]) 09/14/2019  . Asthma 06/26/2014    Past Surgical History:  Procedure Laterality Date  . CLOSED REDUCTION MANDIBLE N/A 07/02/2018   Procedure: CLOSED REDUCTION MANDIBULAR;  Surgeon: Margaretha Sheffield, MD;  Location: [facility] ORS;  Service: ENT;  Laterality: N/A;    Prior to Admission medications   Medication Sig Start Date End Date Taking? Authorizing Provider  albuterol (VENTOLIN HFA) 108 (90 Base) MCG/ACT inhaler Inhale 2 puffs into the lungs every 4 (four) hours as needed for wheezing or shortness of breath. 09/22/19   Clapacs, [name], MD  citalopram (CELEXA) 20 MG tablet Take 1 tablet (20 mg total) by mouth at bedtime. 09/22/19   Clapacs, [name], MD    Allergies Ibuprofen  Family History  Problem Relation Age of Onset  . Asthma Mother   . Asthma Brother     Social History Social History   Tobacco Use  . Smoking status: Current Every Day Smoker     Packs/day: 1.00    Types: Cigarettes  . Smokeless tobacco: Never Used  . Tobacco comment: started smoking again December 2019  Substance Use Topics  . Alcohol use: Not Currently    Alcohol/week: 7.0 standard drinks    Types: 7 Standard drinks or equivalent per week  . Drug use: Yes    Types: Methamphetamines    Review of Systems Constitutional: No fever/chills Eyes: No visual changes. ENT: No sore throat. Cardiovascular: Denies chest pain. Respiratory: Denies shortness of breath. Gastrointestinal: No abdominal pain.  No nausea, no vomiting.  No diarrhea.  No constipation. Genitourinary: Negative for dysuria. Musculoskeletal: Negative for neck pain.  Negative for back pain. Integumentary: Negative for rash. Neurological: Negative for headaches, focal weakness or numbness. Psychiatric:  [place] ideation and methamphetamine abuse   ____________________________________________   PHYSICAL EXAM:  VITAL SIGNS: ED Triage Vitals [10/09/19 2310]  Enc Vitals Group     BP      Pulse      Resp      Temp      Temp src      SpO2      Weight 87.1 kg (192 lb)     Height 1.727 m (5\' 8" )     Head Circumference      Peak Flow      Pain Score 7     Pain Loc      Pain Edu?      Excl. in [place]?     Constitutional:  Alert and oriented.  Eyes: Conjunctivae are normal.  Head: Atraumatic. Mouth/Throat: Patient is wearing a mask. Neck: No stridor.  No meningeal signs.   Cardiovascular: Normal rate, regular rhythm. Good peripheral circulation. Grossly normal heart sounds. Respiratory: Normal respiratory effort.  No retractions. Gastrointestinal: Soft and nontender. No distention.  Musculoskeletal: No lower extremity tenderness nor edema. No gross deformities of extremities. Neurologic:  Normal speech and language. No gross focal neurologic deficits are appreciated.  Skin:  Skin is warm, dry and intact. Psychiatric: Mood and affect are normal. Speech and behavior are  normal.  ____________________________________________   LABS (all labs ordered are listed, but only abnormal results are displayed)  Labs Reviewed  CBC - Abnormal; Notable for the following components:      Result Value   WBC 11.5 (*)    All other components within normal limits  COMPREHENSIVE METABOLIC PANEL  ETHANOL  SALICYLATE LEVEL  ACETAMINOPHEN LEVEL  URINE DRUG SCREEN, QUALITATIVE ([facility] ONLY)     Procedures   ____________________________________________   INITIAL IMPRESSION / MDM / ASSESSMENT AND PLAN / ED COURSE  As part of my medical decision making, I reviewed the following data within the electronic MEDICAL RECORD NUMBER   The patient has been placed in psychiatric observation due to the need to provide a safe environment for the patient while obtaining psychiatric consultation and evaluation, as well as ongoing medical and medication management to treat the patient's condition.  The patient has not been placed under full IVC at this time.  29 year old male presented with above-stated history and physical exam secondary to methamphetamine abuse requesting detox and suicidal ideation.  Awaiting psychiatry consultation and disposition. ____________________________________________  FINAL CLINICAL IMPRESSION(S) / ED DIAGNOSES  Final diagnoses:  Methamphetamine abuse ([name])  Suicidal ideation     MEDICATIONS GIVEN DURING THIS VISIT:  Medications - No data to display   ED Discharge Orders    None      *Please note:  Nathan Koch was evaluated in Emergency Department on 10/09/2019 for the symptoms described in the history of present illness. He was evaluated in the context of the global COVID-19 pandemic, which necessitated consideration that the patient might be at risk for infection with the SARS-CoV-2 virus that causes COVID-19. Institutional protocols and algorithms that pertain to the evaluation of patients at risk for COVID-19 are in a state of rapid change  based on information released by regulatory bodies including the CDC and federal and state organizations. These policies and algorithms were followed during the patient's care in the ED.  Some ED evaluations and interventions may be delayed as a result of limited staffing during the pandemic.*  Note:  This document was prepared using Dragon voice recognition software and may include unintentional dictation errors.   Darci Current, MD 10/10/19 [phone]

## 2019-10-09 NOTE — ED Triage Notes (Signed)
Pt arrives to ED via [facility] with c/o depression and stress r/t recent death of his friend, and c/o drug addiction. Pt denies any current thoughts of HI or SI; no AVH. Pt denies any use of drugs or ETOH over the last 24hrs. Pt is A&O, in NAD; RR even, regular, and unlabored.

## 2019-10-10 ENCOUNTER — Inpatient Hospital Stay
Admission: AD | Admit: 2019-10-10 | Discharge: 2019-10-17 | DRG: 885 | Disposition: A | Discharge status: Home / Self Care | Payer: No Typology Code available for payment source | Source: Intra-hospital | Attending: Psychiatry | Admitting: Psychiatry

## 2019-10-10 ENCOUNTER — Encounter: Payer: Self-pay | Admitting: Internal Medicine

## 2019-10-10 DIAGNOSIS — F333 Major depressive disorder, recurrent, severe with psychotic symptoms: Secondary | ICD-10-CM | POA: Diagnosis present

## 2019-10-10 DIAGNOSIS — Z886 Allergy status to analgesic agent status: Secondary | ICD-10-CM | POA: Diagnosis not present

## 2019-10-10 DIAGNOSIS — Z79899 Other long term (current) drug therapy: Secondary | ICD-10-CM | POA: Diagnosis not present

## 2019-10-10 DIAGNOSIS — J45909 Unspecified asthma, uncomplicated: Secondary | ICD-10-CM | POA: Diagnosis present

## 2019-10-10 DIAGNOSIS — F1520 Other stimulant dependence, uncomplicated: Secondary | ICD-10-CM | POA: Diagnosis present

## 2019-10-10 DIAGNOSIS — F17210 Nicotine dependence, cigarettes, uncomplicated: Secondary | ICD-10-CM | POA: Diagnosis present

## 2019-10-10 DIAGNOSIS — R45851 Suicidal ideations: Secondary | ICD-10-CM | POA: Diagnosis present

## 2019-10-10 DIAGNOSIS — F1590 Other stimulant use, unspecified, uncomplicated: Secondary | ICD-10-CM | POA: Diagnosis present

## 2019-10-10 DIAGNOSIS — Z7951 Long term (current) use of inhaled steroids: Secondary | ICD-10-CM | POA: Diagnosis not present

## 2019-10-10 DIAGNOSIS — F419 Anxiety disorder, unspecified: Secondary | ICD-10-CM | POA: Diagnosis present

## 2019-10-10 LAB — URINE DRUG SCREEN, QUALITATIVE (ARMC ONLY)
Amphetamines, Ur Screen: NOT DETECTED
Barbiturates, Ur Screen: NOT DETECTED
Benzodiazepine, Ur Scrn: NOT DETECTED
Cannabinoid 50 Ng, Ur ~~LOC~~: NOT DETECTED
Cocaine Metabolite,Ur ~~LOC~~: NOT DETECTED
MDMA (Ecstasy)Ur Screen: NOT DETECTED
Methadone Scn, Ur: NOT DETECTED
Opiate, Ur Screen: NOT DETECTED
Phencyclidine (PCP) Ur S: NOT DETECTED
Tricyclic, Ur Screen: NOT DETECTED

## 2019-10-10 LAB — ETHANOL: Alcohol, Ethyl (B): <10 mg/dL (ref <10)

## 2019-10-10 LAB — SALICYLATE LEVEL: Salicylate Lvl: <7 mg/dL — ABNORMAL LOW (ref 7.0–30.0)

## 2019-10-10 LAB — SARS CORONAVIRUS 2 BY RT PCR (HOSPITAL ORDER, PERFORMED IN ~~LOC~~ HOSPITAL LAB)
SARS Coronavirus 2: NEGATIVE
SARS Coronavirus 2: NEGATIVE

## 2019-10-10 LAB — ACETAMINOPHEN LEVEL: Acetaminophen (Tylenol), Serum: <10 ug/mL — ABNORMAL LOW (ref 10–30)

## 2019-10-10 MED ORDER — HYDROXYZINE HCL 25 MG PO TABS
25.0000 mg | ORAL_TABLET | Freq: Four times a day (QID) | ORAL | Status: DC | PRN
  Administered 2019-10-11 – 2019-10-16 (×7): 25 mg via ORAL
  Filled 2019-10-10 (×7): qty 1

## 2019-10-10 MED ORDER — ACETAMINOPHEN 325 MG PO TABS: 650.0000 mg | ORAL_TABLET | Freq: Four times a day (QID) | ORAL | Status: DC | PRN

## 2019-10-10 MED ORDER — QUETIAPINE FUMARATE 25 MG PO TABS: 50.0000 mg | ORAL_TABLET | Freq: Every day | ORAL | Status: DC

## 2019-10-10 MED ORDER — MAGNESIUM HYDROXIDE 400 MG/5ML PO SUSP: 30.0000 mL | Freq: Every day | ORAL | Status: DC | PRN

## 2019-10-10 MED ORDER — ALUM & MAG HYDROXIDE-SIMETH 200-200-20 MG/5ML PO SUSP: 30.0000 mL | ORAL | Status: DC | PRN

## 2019-10-10 NOTE — Progress Notes (Signed)
29 year old male, suffered a loss yesterday, a friend overdosed on heroin. This was friend that the patient was known to use with and grew up with. Patient states that this was a life situation that has hit harder that loosing his home and child. He states that he could be the one laying in a casket instead of his friend. The patient stated that when he left here last month that he went to [facility] and it was a successful program, but, should be longer. He mentioned that he was accepted in the addiction program at [facility]. He is very hopeful for is future and feels that this situation will allow him to better care for his daughter. The patient is not depressed at this time, denies SI/HI AVH. We will provide a safe milieu and monitor for progress towards discharge.    Angelina Sheriff, RN

## 2019-10-10 NOTE — Consult Note (Signed)
[facility] Face-to-Face Psychiatry Consult   Reason for Consult: Mental Health Problem Referring Physician: Dr. Manson Passey Patient Identification: [name] MRN:  [id number] Principal Diagnosis: <principal problem not specified> Diagnosis:  Active Problems:   Asthma   Methamphetamine use ([name])   MDD (major depressive disorder), recurrent, severe, with psychosis ([name])   MDD (major depressive disorder), single episode, severe with psychosis ([name])   Total Time spent with patient: 30 minutes  Subjective: "My friend overdosed yesterday and died.  Having a rough time dealing with it" Nathan Koch is a 29 y.o. male patient presented to [facility] ED via POV voluntary. The patient came in voicing his friend's overdose yesterday (06.14.21), and he is having a hard time dealing with it. The patient is teary-eyed on this visit.  He is voicing suicidal ideation with a plan to jump in traffic and get hit by a car. The patient was seen face-to-face by this provider; the chart was reviewed and consulted with Dr. Manson Passey on 10/09/2019 due to the patient's care. It was discussed with the EDP that the patient does meet the criteria to be admitted to the psychiatric inpatient unit. The patient is alert and oriented x 4, calm, cooperative, and mood-congruent with affect on evaluation. He is depressed, the patient's UDS is negative for all substances. The patient is also homeless.  The patient does not appear to be responding to internal and external stimuli.  The patient is not presenting with any delusional thinking. The patient denies auditory hallucination and visual hallucinations. The patient admits to suicidal ideation with a plan to jump into traffic and get hit by a car. The patient is not presenting with any psychotic or paranoid behaviors. During an encounter with the patient, he was able to answer questions appropriately.  Plan: The patient is a safety risk to himself and does require psychiatric inpatient admission  for stabilization and treatment.  HPI: Per Dr. Manson Passey: Nathan Koch is a 29 y.o. male below list of previous medical conditions including depression and methamphetamine abuse presents to the emergency department suicidal ideation with plan and request for detox from methamphetamines.  Patient states last meth use was 24 hours ago.  Patient states that he was recently in detox a month ago.    Past Psychiatric History:  Anxiety Depression Methamphetamine abuse ([name])  Risk to Self: Suicidal Ideation: Yes-Currently Present Suicidal Intent: Yes-Currently Present Is patient at risk for suicide?: Yes Suicidal Plan?: Yes-Currently Present Specify Current Suicidal Plan:  (attempted to jump in traffic) Access to Means: No What has been your use of drugs/alcohol within the last 12 months?:  (Heroin ) Intentional Self Injurious Behavior:  (attempted to jump in traffic) Yes Risk to Others: Homicidal Ideation: No Thoughts of Harm to Others: No Current Homicidal Intent: No Current Homicidal Plan: No Access to Homicidal Means: No History of harm to others?: No Assessment of Violence: None Noted Criminal Charges Pending?: No Does patient have a court date: Yes Court Date:  (July 21) Yes Prior Inpatient Therapy: Prior Inpatient Therapy: Yes Prior Therapy Dates: 03/2019 Prior Therapy Facilty/Provider(s): [facility] Reason for Treatment: Substance Abuse, Depression Unknown Prior Outpatient Therapy: Prior Outpatient Therapy:  ([facility]) Does patient have an ACCT team?: Unknown Does patient have Intensive In-House Services?  : Unknown Does patient have [facility] services? : Unknown Does patient have P4CC services?: Unknown Unknown  Past Medical History:  Past Medical History:  Diagnosis Date  . Anxiety   . Asthma   . Depression   .  Methamphetamine abuse [facility])     Past Surgical History:  Procedure Laterality Date  . CLOSED REDUCTION MANDIBLE N/A 07/02/2018   Procedure: CLOSED REDUCTION  MANDIBULAR;  Surgeon: Vernie Murders, MD;  Location: [facility] ORS;  Service: ENT;  Laterality: N/A;   Family History:  Family History  Problem Relation Age of Onset  . Asthma Mother   . Asthma Brother    Family Psychiatric  History: Unknown Social History:  Social History   Substance and Sexual Activity  Alcohol Use Not Currently  . Alcohol/week: 7.0 standard drinks  . Types: 7 Standard drinks or equivalent per week     Social History   Substance and Sexual Activity  Drug Use Yes  . Types: Methamphetamines    Social History   Socioeconomic History  . Marital status: Single    Spouse name: Not on file  . Number of children: Not on file  . Years of education: Not on file  . Highest education level: Not on file  Occupational History  . Not on file  Tobacco Use  . Smoking status: Current Every Day Smoker    Packs/day: 1.00    Types: Cigarettes  . Smokeless tobacco: Never Used  . Tobacco comment: started smoking again December 2019  Substance and Sexual Activity  . Alcohol use: Not Currently    Alcohol/week: 7.0 standard drinks    Types: 7 Standard drinks or equivalent per week  . Drug use: Yes    Types: Methamphetamines  . Sexual activity: Not on file  Other Topics Concern  . Not on file  Social History Narrative  . Not on file   Social Determinants of Health   Financial Resource Strain:   . Difficulty of Paying Living Expenses:   Food Insecurity:   . Worried About Programme researcher, broadcasting/film/video in the Last Year:   . Barista in the Last Year:   Transportation Needs:   . Freight forwarder (Medical):   Marland Kitchen Lack of Transportation (Non-Medical):   Physical Activity:   . Days of Exercise per Week:   . Minutes of Exercise per Session:   Stress:   . Feeling of Stress :   Social Connections:   . Frequency of Communication with Friends and Family:   . Frequency of Social Gatherings with Friends and Family:   . Attends Religious Services:   . Active Member of Clubs  or Organizations:   . Attends Banker Meetings:   Marland Kitchen Marital Status:    Additional Social History:    Allergies:   Allergies  Allergen Reactions  . Ibuprofen Swelling    Labs:  Results for orders placed or performed during the hospital encounter of 10/09/19 (from the past 48 hour(s))  Comprehensive metabolic panel     Status: Abnormal   Collection Time: 10/09/19 11:13 PM  Result Value Ref Range   Sodium 139 135 - 145 mmol/L   Potassium 3.9 3.5 - 5.1 mmol/L   Chloride 105 98 - 111 mmol/L   CO2 25 22 - 32 mmol/L   Glucose, Bld 110 (H) 70 - 99 mg/dL    Comment: Glucose reference range applies only to samples taken after fasting for at least 8 hours.   BUN 11 6 - 20 mg/dL   Creatinine, Ser 9.37 0.61 - 1.24 mg/dL   Calcium 9.1 8.9 - 16.9 mg/dL   Total Protein 7.7 6.5 - 8.1 g/dL   Albumin 4.3 3.5 - 5.0 g/dL   AST 23 15 -  41 U/L   ALT 13 0 - 44 U/L   Alkaline Phosphatase 56 38 - 126 U/L   Total Bilirubin 1.0 0.3 - 1.2 mg/dL   GFR calc non Af Amer >60 >60 mL/min   GFR calc Af Amer >60 >60 mL/min   Anion gap 9 5 - 15    Comment: Performed at [facility], [address] [place], [place] [address]  Ethanol     Status: None   Collection Time: 10/09/19 11:13 PM  Result Value Ref Range   Alcohol, Ethyl (B) <10 <10 mg/dL    Comment: (NOTE) Lowest detectable limit for serum alcohol is 10 mg/dL.  For medical purposes only. Performed at [facility], [address] [place], [place] [address]   Salicylate level     Status: Abnormal   Collection Time: 10/09/19 11:13 PM  Result Value Ref Range   Salicylate Lvl <7.0 (L) 7.0 - 30.0 mg/dL    Comment: Performed at [facility], [address] [place], [place] [address]  Acetaminophen level     Status: Abnormal   Collection Time: 10/09/19 11:13 PM  Result Value Ref Range   Acetaminophen (Tylenol), Serum <10 (L) 10 - 30 ug/mL    Comment: (NOTE) Therapeutic concentrations vary significantly.  A range of 10-30 ug/mL  may be an effective concentration for many patients. However, some  are best treated at concentrations outside of this range. Acetaminophen concentrations >150 ug/mL at 4 hours after ingestion  and >50 ug/mL at 12 hours after ingestion are often associated with  toxic reactions.  Performed at [facility], [address] [place], [place] [address]   cbc     Status: Abnormal   Collection Time: 10/09/19 11:13 PM  Result Value Ref Range   WBC 11.5 (H) 4.0 - 10.5 K/uL   RBC 4.83 4.22 - 5.81 MIL/uL   Hemoglobin 13.7 13.0 - 17.0 g/dL   HCT 68.0 39 - 52 %   MCV 85.1 80.0 - 100.0 fL   MCH 28.4 26.0 - 34.0 pg   MCHC 33.3 30.0 - 36.0 g/dL   RDW 88.1 10.3 - 15.9 %   Platelets 335 150 - 400 K/uL   nRBC 0.0 0.0 - 0.2 %    Comment: Performed at [facility], [address], [place], [place] [address]  Urine Drug Screen, Qualitative     Status: None   Collection Time: 10/09/19 11:13 PM  Result Value Ref Range   Tricyclic, Ur Screen NONE DETECTED NONE DETECTED   Amphetamines, Ur Screen NONE DETECTED NONE DETECTED   MDMA (Ecstasy)Ur Screen NONE DETECTED NONE DETECTED   Cocaine Metabolite,Ur [place] NONE DETECTED NONE DETECTED   Opiate, Ur Screen NONE DETECTED NONE DETECTED   Phencyclidine (PCP) Ur S NONE DETECTED NONE DETECTED   Cannabinoid 50 Ng, Ur [place] NONE DETECTED NONE DETECTED   Barbiturates, Ur Screen NONE DETECTED NONE DETECTED   Benzodiazepine, Ur Scrn NONE DETECTED NONE DETECTED   Methadone Scn, Ur NONE DETECTED NONE DETECTED    Comment: (NOTE) Tricyclics + metabolites, urine    Cutoff 1000 ng/mL Amphetamines + metabolites, urine  Cutoff 1000 ng/mL MDMA (Ecstasy), urine              Cutoff 500 ng/mL Cocaine Metabolite, urine          Cutoff 300 ng/mL Opiate + metabolites, urine        Cutoff 300 ng/mL Phencyclidine (PCP), urine  Cutoff 25 ng/mL Cannabinoid, urine                 Cutoff 50 ng/mL Barbiturates + metabolites, urine   Cutoff 200 ng/mL Benzodiazepine, urine              Cutoff 200 ng/mL Methadone, urine                   Cutoff 300 ng/mL  The urine drug screen provides only a preliminary, unconfirmed analytical test result and should not be used for non-medical purposes. Clinical consideration and professional judgment should be applied to any positive drug screen result due to possible interfering substances. A more specific alternate chemical method must be used in order to obtain a confirmed analytical result. Gas chromatography / mass spectrometry (GC/MS) is the preferred confirm atory method. Performed at [facility], [address] [place], [place] [address]   SARS Coronavirus 2 by RT PCR (hospital order, performed in [facility] hospital lab) Nasopharyngeal Nasopharyngeal Swab     Status: None   Collection Time: 10/10/19 12:29 AM   Specimen: Nasopharyngeal Swab  Result Value Ref Range   SARS Coronavirus 2 NEGATIVE NEGATIVE    Comment: (NOTE) SARS-CoV-2 target nucleic acids are NOT DETECTED.  The SARS-CoV-2 RNA is generally detectable in upper and lower respiratory specimens during the acute phase of infection. The lowest concentration of SARS-CoV-2 viral copies this assay can detect is 250 copies / mL. A negative result does not preclude SARS-CoV-2 infection and should not be used as the sole basis for treatment or other patient management decisions.  A negative result may occur with improper specimen collection / handling, submission of specimen other than nasopharyngeal swab, presence of viral mutation(s) within the areas targeted by this assay, and inadequate number of viral copies (<250 copies / mL). A negative result must be combined with clinical observations, patient history, and epidemiological information.  Fact Sheet for Patients:   StrictlyIdeas.no  Fact Sheet for Healthcare Providers: BankingDealers.co.za  This  test is not yet approved or  cleared by the [place] FDA and has been authorized for detection and/or diagnosis of SARS-CoV-2 by FDA under an Emergency Use Authorization (EUA).  This EUA will remain in effect (meaning this test can be used) for the duration of the COVID-19 declaration under Section 564(b)(1) of the Act, 21 U.S.C. section 360bbb-3(b)(1), unless the authorization is terminated or revoked sooner.  Performed at [facility], [address] [place], [place] [address]   SARS Coronavirus 2 by RT PCR (hospital order, performed in [place] hospital lab)     Status: None   Collection Time: 10/10/19 12:29 AM  Result Value Ref Range   SARS Coronavirus 2 NEGATIVE NEGATIVE    Comment: (NOTE) SARS-CoV-2 target nucleic acids are NOT DETECTED.  The SARS-CoV-2 RNA is generally detectable in upper and lower respiratory specimens during the acute phase of infection. The lowest concentration of SARS-CoV-2 viral copies this assay can detect is 250 copies / mL. A negative result does not preclude SARS-CoV-2 infection and should not be used as the sole basis for treatment or other patient management decisions.  A negative result may occur with improper specimen collection / handling, submission of specimen other than nasopharyngeal swab, presence of viral mutation(s) within the areas targeted by this assay, and inadequate number of viral copies (<250 copies / mL). A negative result must be combined with clinical observations, patient history, and epidemiological information.  Fact Sheet for Patients:  BoilerBrush.com.cyhttps://www.fda.gov/media/136312/download  Fact Sheet for Healthcare Providers: https://pope.com/https://www.fda.gov/media/136313/download  This test is not yet approved or  cleared by the [place] FDA and has been authorized for detection and/or diagnosis of SARS-CoV-2 by FDA under an Emergency Use Authorization (EUA).  This EUA will remain in effect (meaning this test can be  used) for the duration of the COVID-19 declaration under Section 564(b)(1) of the Act, 21 U.S.C. section 360bbb-3(b)(1), unless the authorization is terminated or revoked sooner.  Performed at [facility], [address] [place], [place] [id number]     No current facility-administered medications for this encounter.   Current Outpatient Medications  Medication Sig Dispense Refill  . albuterol (VENTOLIN HFA) 108 (90 Base) MCG/ACT inhaler Inhale 2 puffs into the lungs every 4 (four) hours as needed for wheezing or shortness of breath. 18 g 1  . citalopram (CELEXA) 20 MG tablet Take 1 tablet (20 mg total) by mouth at bedtime. 30 tablet 1    Musculoskeletal: Strength & Muscle Tone: within normal limits Gait & Station: normal Patient leans: N/A  Psychiatric Specialty Exam: Physical Exam  Nursing note and vitals reviewed. Constitutional: He is oriented to person, place, and time.  Respiratory: Effort normal.  Musculoskeletal:        General: Normal range of motion.     Cervical back: Normal range of motion and neck supple.  Neurological: He is alert and oriented to person, place, and time.    Review of Systems  Psychiatric/Behavioral: Positive for agitation, behavioral problems, hallucinations and suicidal ideas. The patient is nervous/anxious.   All other systems reviewed and are negative.   Blood pressure 109/64, pulse (!) 55, temperature 97.6 F (36.4 C), temperature source Oral, resp. rate 18, height 5\' 8"  (1.727 m), weight 87.1 kg, SpO2 99 %.Body mass index is 29.19 kg/m.  General Appearance: Casual  Eye Contact:  Good  Speech:  Clear and Coherent  Volume:  Normal  Mood:  Depressed  Affect:  Appropriate, Congruent and Depressed  Thought Process:  Coherent, Disorganized and Descriptions of Associations: Tangential  Orientation:  Full (Time, Place, and Person)  Thought Content:  Logical  Suicidal Thoughts:  Yes.  with intent/plan  Homicidal Thoughts:  No   Memory:  Immediate;   Good Recent;   Fair Remote;   Fair  Judgement:  Good  Insight:  Fair  Psychomotor Activity:  Increased  Concentration:  Concentration: Good and Attention Span: Good  Recall:  Fair  Fund of Knowledge:  Good  Language:  Good  Akathisia:  Negative  Handed:  Right  AIMS (if indicated):     Assets:  Communication Skills Desire for Improvement Financial Resources/Insurance Housing Resilience Social Support  ADL's:  Intact  Cognition:  WNL  Sleep:    Insomnia     Treatment Plan Summary: Plan Patient meets criteria for psychiatric inpatient admission.  Disposition: Recommend psychiatric Inpatient admission when medically cleared. Supportive therapy provided about ongoing stressors.  Gillermo MurdochJacqueline Mele Sylvester, NP 10/10/2019 6:20 AM

## 2019-10-10 NOTE — ED Notes (Signed)
Pt transferred into ED BHU room 5   Patient assigned to appropriate care area. Patient oriented to unit/care area: Informed that, for their safety, care areas are designed for safety and monitored by security cameras at all times; Visiting hours and phone times explained to patient. Patient verbalizes understanding, and verbal contract for safety obtained.   Assessment completed  He denies pain  °

## 2019-10-10 NOTE — BH Assessment (Signed)
Assessment Note  Nathan Koch is an 29 y.o. African-American male who presents to the ED via [facility] with c/o of depression and stress. Per initial triage note, "r/t recent death of his friend, and c/o drug addiction. Pt denies any current thoughts of HI or SI; no AVH. Pt denies any use of drugs or ETOH over the last 24hrs. Pt is A&O, in NAD; RR even, regular, and unlabored".    Writer assessed patient and patient reports that his friend overdosed on heroin yesterday and passed away. Patient reports " one of my friends who does heroin died today and I have been battling myself". Patient reported that he has PTSD from both of his parents dying from cancer as well as his only brother who died of an overdose. Patient reports he witnessed his brother overdosing and dying in front of him. Patient became to shake and got a little tearful as he talked about his trauma.   Patient reported that he feels suicidal and attempted to jump in front of a car yesterday. Patient reports that he hasn't been able to get sufficient rest the past few weeks stating there has been times where he was up for over 24 hours and reported that when he is able to sleep- its only about 3-6 hours. Patient denied AH/VH/HI. Patient reports he use to go to [place] and hasn't been able to go due to transportation issues. Patient reported transportation will be up and running soon and that when that starts back he will be able to continue treatment.    This case was staffed with Kennyth Lose, NP and Owens Shark, MD. Patient is appropriate for admission. Will get patient to sign himself in.     Diagnosis: PTSD, Bi-Polar D/O, Anxiety, Depression   Past Medical History:  Past Medical History:  Diagnosis Date  . Anxiety   . Asthma   . Depression   . Methamphetamine abuse [facility])     Past Surgical History:  Procedure Laterality Date  . CLOSED REDUCTION MANDIBLE N/A 07/02/2018   Procedure: CLOSED REDUCTION MANDIBULAR;  Surgeon: Margaretha Sheffield, MD;   Location: [facility] ORS;  Service: ENT;  Laterality: N/A;    Family History:  Family History  Problem Relation Age of Onset  . Asthma Mother   . Asthma Brother     Social History:  reports that he has been smoking cigarettes. He has been smoking about 1.00 pack per day. He has never used smokeless tobacco. He reports previous alcohol use of about 7.0 standard drinks of alcohol per week. He reports current drug use. Drug: Methamphetamines.  Additional Social History:  Alcohol / Drug Use Pain Medications: See PTA Prescriptions: See PTA Over the Counter: See PTA History of alcohol / drug use?: No history of alcohol / drug abuse  CIWA: CIWA-Ar BP: 109/64 Pulse Rate: (!) 55 COWS:    Allergies:  Allergies  Allergen Reactions  . Ibuprofen Swelling    Home Medications: (Not in a hospital admission)   OB/GYN Status:  No LMP for male patient.  General Assessment Data Location of Assessment: [facility] ED TTS Assessment: In system Is this a Tele or Face-to-Face Assessment?: Face-to-Face Is this an Initial Assessment or a Re-assessment for this encounter?: Initial Assessment Patient Accompanied by:: N/A Language Other than English: No Living Arrangements: Other (Comment) What gender do you identify as?: Male Marital status: Married Living Arrangements: Other (Comment) Admission Status: Voluntary Is patient capable of signing voluntary admission?: Yes Referral Source: Self/Family/Friend  Medical Screening Exam [facility]  Walk-in ONLY) Medical Exam completed: Yes  Crisis Care Plan Living Arrangements: Other (Comment) [facility] of Psychiatrist: [facility] Name of Therapist: [facility]  Education Status Is patient currently in school?: No Highest grade of school patient has completed: 9th Is the patient employed, unemployed or receiving disability?: Unemployed  Risk to self with the past 6 months Suicidal Ideation: Yes-Currently Present Has patient been a risk to self within the past 6 months prior to  admission? : Yes Suicidal Intent: Yes-Currently Present Has patient had any suicidal intent within the past 6 months prior to admission? : Yes Is patient at risk for suicide?: Yes Suicidal Plan?: Yes-Currently Present Has patient had any suicidal plan within the past 6 months prior to admission? : Yes Specify Current Suicidal Plan:  (attempted to jump in traffic) Access to Means: No What has been your use of drugs/alcohol within the last 12 months?:  (Heroin ) Previous Attempts/Gestures: Yes Intentional Self Injurious Behavior:  (attempted to jump in traffic) Family Suicide History: Yes Recent stressful life event(s): Loss (Comment), Trauma (Comment) (frien overdosed yesterday and died) Persecutory voices/beliefs?: No Depression: Yes Depression Symptoms: Feeling worthless/self pity, Guilt, Insomnia Substance abuse history and/or treatment for substance abuse?: Yes Suicide prevention information given to non-admitted patients: Not applicable  Risk to Others within the past 6 months Homicidal Ideation: No Does patient have any lifetime risk of violence toward others beyond the six months prior to admission? : No Thoughts of Harm to Others: No Current Homicidal Intent: No Current Homicidal Plan: No Access to Homicidal Means: No History of harm to others?: No Assessment of Violence: None Noted Criminal Charges Pending?: No Does patient have a court date: Yes Court Date:  (July 21) Is patient on probation?: Unknown  Psychosis Hallucinations: None noted Delusions: None noted  Mental Status Report Appearance/Hygiene: In scrubs Eye Contact: Good Motor Activity: Rigidity, Tremors Speech: Logical/coherent Level of Consciousness: Alert Mood: Anxious, Depressed, Sad, Guilty Affect: Anxious, Appropriate to circumstance Anxiety Level: Moderate Thought Processes: Coherent, Relevant Judgement: Impaired Orientation: Person, Place, Time, Situation, Appropriate for developmental  age Obsessive Compulsive Thoughts/Behaviors: None  Cognitive Functioning Concentration: Normal Memory: Recent Intact, Remote Intact Is patient IDD: No Insight: Fair Impulse Control: Poor Appetite: Fair Have you had any weight changes? : No Change Sleep: Decreased Total Hours of Sleep:  (3-6)  ADLScreening [facility]) Patient's cognitive ability adequate to safely complete daily activities?: Yes Patient able to express need for assistance with ADLs?: No Independently performs ADLs?: Yes (appropriate for developmental age)  Prior Inpatient Therapy Prior Inpatient Therapy: Yes Prior Therapy Dates: 03/2019 Prior Therapy Facilty/Provider(s): [facility] Reason for Treatment: Substance Abuse, Depression  Prior Outpatient Therapy Prior Outpatient Therapy:  ([facility]) Does patient have an ACCT team?: Unknown Does patient have Intensive In-House Services?  : Unknown Does patient have [facility] services? : Unknown Does patient have P4CC services?: Unknown  ADL Screening (condition at time of admission) Patient's cognitive ability adequate to safely complete daily activities?: Yes Is the patient deaf or have difficulty hearing?: No Does the patient have difficulty seeing, even when wearing glasses/contacts?: No Does the patient have difficulty concentrating, remembering, or making decisions?: No Patient able to express need for assistance with ADLs?: No Does the patient have difficulty dressing or bathing?: No Independently performs ADLs?: Yes (appropriate for developmental age) Does the patient have difficulty walking or climbing stairs?: No Weakness of Legs: None Weakness of Arms/Hands: None  Home Assistive Devices/Equipment Home Assistive Devices/Equipment: None  Therapy Consults (therapy consults require a physician  order) PT Evaluation Needed: No OT Evalulation Needed: No SLP Evaluation Needed: No Abuse/Neglect Assessment (Assessment to be complete while  patient is alone) Abuse/Neglect Assessment Can Be Completed: Yes Physical Abuse: Denies Verbal Abuse: Denies Sexual Abuse: Denies Exploitation of patient/patient's resources: Denies Self-Neglect: Denies Values / Beliefs Cultural Requests During Hospitalization: None Spiritual Requests During Hospitalization: None Consults Spiritual Care Consult Needed: No Transition of Care Team Consult Needed: No Advance Directives (For Healthcare) Does Patient Have a Medical Advance Directive?: No          Disposition:  Disposition Initial Assessment Completed for this Encounter: Yes Patient referred to:  Yvetta Coder)  On Site Evaluation by:   Reviewed with Physician:    Roderick Pee [place] Ganoe,MSc., [facility] 10/10/2019 6:17 AM

## 2019-10-10 NOTE — Plan of Care (Signed)
29 year old male, suffered a loss yesterday, a friend overdosed on heroin. This was friend that the patient was known to use with and grew up with. Patient states that this was a life situation that has hit harder that loosing his home and child. He states that he could be the one laying in a casket instead of his friend. The patient stated that when he left here last month that he went to [facility] and it was a successful program, but, should be longer. He mentioned that he was accepted in the addiction program at [facility]. He is very hopeful for is future and feels that this situation will allow him to better care for his daughter. The patient is not depressed at this time, denies SI/HI AVH. We will provide a safe milieu and monitor for progress towards discharge.

## 2019-10-10 NOTE — ED Notes (Signed)
Assumed care of patient, patient sleeping but awakens to name. Reports was feeling SI, after smoking meth last night. Feeling sad about loss of a friend. Patient currently denied SI/HI/SI. Des not take medications due to being homeless. Awaiting re-assessment this morning and further plan of care. Safety maintained. Will continue to monitor.

## 2019-10-10 NOTE — BH Assessment (Signed)
Patient can come down after 2pm  Patient is to be admitted to [facility] BMU by Dr. Toni Amend.  Attending Physician will be. Dr. Toni Amend.   Patient has been assigned to room 311, by [facility] Charge Nurse Demetria, RN.  Intake Paper Work has been signed and placed on patient chart.  ER staff is aware of the admission: 1. Misty Stanley, ER Secretary  2. Derrill Kay, ER MD  3. Amy Patient's Nurse  4. Valerie Patient Access.

## 2019-10-11 DIAGNOSIS — F333 Major depressive disorder, recurrent, severe with psychotic symptoms: Principal | ICD-10-CM

## 2019-10-11 MED ORDER — DIVALPROEX SODIUM 500 MG PO DR TAB
500.0000 mg | DELAYED_RELEASE_TABLET | Freq: Two times a day (BID) | ORAL | Status: DC
  Administered 2019-10-11 – 2019-10-17 (×13): 500 mg via ORAL
  Filled 2019-10-11 (×13): qty 1

## 2019-10-11 MED ORDER — ALBUTEROL SULFATE HFA 108 (90 BASE) MCG/ACT IN AERS
2.0000 | INHALATION_SPRAY | Freq: Four times a day (QID) | RESPIRATORY_TRACT | Status: DC | PRN
  Administered 2019-10-11 – 2019-10-16 (×4): 2 via RESPIRATORY_TRACT
  Filled 2019-10-11: qty 6.7

## 2019-10-11 MED ORDER — CITALOPRAM HYDROBROMIDE 20 MG PO TABS
20.0000 mg | ORAL_TABLET | Freq: Every day | ORAL | Status: DC
  Administered 2019-10-11 – 2019-10-17 (×7): 20 mg via ORAL
  Filled 2019-10-11 (×7): qty 1

## 2019-10-11 MED ORDER — QUETIAPINE FUMARATE 100 MG PO TABS
100.0000 mg | ORAL_TABLET | Freq: Every day | ORAL | Status: DC
  Administered 2019-10-11 – 2019-10-16 (×6): 100 mg via ORAL
  Filled 2019-10-11 (×6): qty 1

## 2019-10-11 MED ORDER — FLUTICASONE FUROATE-VILANTEROL 200-25 MCG/INH IN AEPB
1.0000 | INHALATION_SPRAY | Freq: Every day | RESPIRATORY_TRACT | Status: DC
  Administered 2019-10-11 – 2019-10-15 (×4): 1 via RESPIRATORY_TRACT
  Filled 2019-10-11: qty 28

## 2019-10-11 MED ORDER — PNEUMOCOCCAL VAC POLYVALENT 25 MCG/0.5ML IJ INJ
0.5000 mL | INJECTION | INTRAMUSCULAR | Status: DC
  Filled 2019-10-11: qty 0.5

## 2019-10-11 NOTE — BHH Suicide Risk Assessment (Signed)
[facility] Admission Suicide Risk Assessment   Nursing information obtained from:  Patient Demographic factors:  Male, Low socioeconomic status Current Mental Status:  NA Loss Factors:  Loss of significant relationship, Financial problems / change in socioeconomic status Historical Factors:  Impulsivity Risk Reduction Factors:  Positive coping skills or problem solving skills, Responsible for children under 28 years of age, Sense of responsibility to family, Positive social support  Total Time spent with patient: 1 hour Principal Problem: Depression, major, recurrent, severe with psychosis ([name]) Diagnosis:  Principal Problem:   Depression, major, recurrent, severe with psychosis ([name]) Active Problems:   Asthma   Methamphetamine use ([name])  Subjective Data: Patient seen and chart reviewed.  29 year old man presented to the emergency room saying he was depressed and having suicidal ideation.  On interview today he continues to endorse some suicidal thoughts but does not have any plan or intention of acting on it.  Denies psychotic symptoms.  Mood seems to have been recently down because of stressful situation.  He expresses a desire to continue being cooperative with treatment  Continued Clinical Symptoms:  Alcohol Use Disorder Identification Test Final Score (AUDIT): 0 The "Alcohol Use Disorders Identification Test", Guidelines for Use in Primary Care, Second Edition.  World Science writer [facility]). Score between 0-7:  no or low risk or alcohol related problems. Score between 8-15:  moderate risk of alcohol related problems. Score between 16-19:  high risk of alcohol related problems. Score 20 or above:  warrants further diagnostic evaluation for alcohol dependence and treatment.   CLINICAL FACTORS:   Depression:   Comorbid alcohol abuse/dependence Alcohol/Substance Abuse/Dependencies   Musculoskeletal: Strength & Muscle Tone: within normal limits Gait & Station: normal Patient leans:  N/A  Psychiatric Specialty Exam: Physical Exam  Nursing note and vitals reviewed. Constitutional: He appears well-developed.  HENT:  Head: Normocephalic and atraumatic.  Eyes: Pupils are equal, round, and reactive to light. Conjunctivae are normal.  Cardiovascular: Normal heart sounds.  Respiratory: Effort normal.  GI: Soft.  Musculoskeletal:        General: Normal range of motion.     Cervical back: Normal range of motion.  Neurological: He is alert.  Skin: Skin is warm and dry.  Psychiatric: His speech is delayed. He exhibits a depressed mood. He expresses suicidal ideation. He expresses no suicidal plans.    Review of Systems  Constitutional: Negative.   HENT: Negative.   Eyes: Negative.   Respiratory: Negative.   Cardiovascular: Negative.   Gastrointestinal: Negative.   Musculoskeletal: Negative.   Skin: Negative.   Neurological: Negative.   Psychiatric/Behavioral: Positive for behavioral problems. The patient is nervous/anxious.     Blood pressure 110/65, pulse (!) 58, temperature 98 F (36.7 C), temperature source Oral, resp. rate 18, SpO2 100 %.There is no height or weight on file to calculate BMI.  General Appearance: Casual  Eye Contact:  Good  Speech:  Clear and Coherent  Volume:  Normal  Mood:  Dysphoric  Affect:  Congruent  Thought Process:  Goal Directed  Orientation:  Full (Time, Place, and Person)  Thought Content:  Logical  Suicidal Thoughts:  No  Homicidal Thoughts:  No  Memory:  Immediate;   Fair Recent;   Fair Remote;   Fair  Judgement:  Fair  Insight:  Fair  Psychomotor Activity:  Normal  Concentration:  Concentration: Fair  Recall:  [employer] of Knowledge:  Fair  Language:  Fair  Akathisia:  No  Handed:  Right  AIMS (if indicated):  Assets:  Desire for Improvement Housing Physical Health Resilience  ADL's:  Intact  Cognition:  WNL  Sleep:  Number of Hours: 7.75      COGNITIVE FEATURES THAT CONTRIBUTE TO RISK:  None     SUICIDE RISK:   Minimal: No identifiable suicidal ideation.  Patients presenting with no risk factors but with morbid ruminations; may be classified as minimal risk based on the severity of the depressive symptoms  PLAN OF CARE: Continue 15-minute checks.  Restart medicine.  Engage in individual and group therapy.  Work on appropriate discharge planning  I certify that inpatient services furnished can reasonably be expected to improve the patient's condition.   Alethia Berthold, MD 10/11/2019, 4:13 PM

## 2019-10-11 NOTE — Plan of Care (Signed)
D: Pt alert and oriented x 4. Pt rates depression 7/10, hopelessness 7/10, and anxiety 10/10. Pt reports energy level as normal and concentration as being good. Pt reports sleep last night as being good. Pt did receive medications for sleep and did find them helpful. Pt reports experiencing 7/10 stomach pain at this time, pt denied wanting any meds at this time. Pt denies experiencing any HI, or AVH at this time. Pt reports experiencing SI on self inventory however denies verbally face - to -face. Pt contracts for safety at this time.   A: Scheduled medications administered to pt, per MD orders. Support and encouragement provided. Frequent verbal contact made. Routine safety checks conducted q15 minutes.   R: No adverse drug reactions noted. Pt verbally contracts for safety at this time. Pt complaint with medications and treatment plan. Pt interacts well with others on the unit. Pt remains safe at this time. Will continue to monitor.   Problem: Education: Goal: Knowledge of [place] [facility] Education information/materials will improve Outcome: Progressing Goal: Verbalization of understanding the information provided will improve Outcome: Progressing   Problem: Activity: Goal: Interest or engagement in activities will improve Outcome: Progressing

## 2019-10-11 NOTE — Progress Notes (Addendum)
Recreation Therapy Notes   Date: 10/11/2019  Time: 9:30 am  Location: Craft room   Behavioral response: Appropriate  Intervention Topic: Decision Making    Discussion/Intervention:  Group content today was focused on Decision making. The group defined decision making and some positive ways they make decisions for themselves. Individuals expressed reasons why they neglected any decision making in the past. Patients described ways to improve decision making skills in the future. The group explained what could happen if they did not do any decision making at all. Participants express how bad decision has affected them and others around them. Individual explained the importance of decision making. The group participated in the intervention "Making decisions" where they had a chance to discover some of their weaknesses and strengths in decision making. Patient came up with a new decision-making skill to improve themselves in the future.  Clinical Observations/Feedback:  Patient came to group and explained that decision making is important because you have to make a choice. He stated that sometimes peer pressure gets in the way of making the right decision.  Individual was social with peers and staff while participating on the intervention.  Thornton Dohrmann LRT/CTRS        Kingdavid Leinbach 10/11/2019 11:33 AM

## 2019-10-11 NOTE — Plan of Care (Signed)
Patient new to the unit today, hasn't had time to progress  Problem: Education: Goal: Knowledge of [place] [facility] Education information/materials will improve Outcome: Not Progressing Goal: Emotional status will improve Outcome: Not Progressing Goal: Mental status will improve Outcome: Not Progressing Goal: Verbalization of understanding the information provided will improve Outcome: Not Progressing   Problem: Activity: Goal: Interest or engagement in activities will improve Outcome: Not Progressing Goal: Sleeping patterns will improve Outcome: Not Progressing   Problem: Coping: Goal: Ability to verbalize frustrations and anger appropriately will improve Outcome: Not Progressing Goal: Ability to demonstrate self-control will improve Outcome: Not Progressing   
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
## 2019-10-11 NOTE — Progress Notes (Signed)
Patient pleasant during assessment denying SI/HI/AVH and pain. Patient endorses anxiety and depression stemming from the loss of a close friend recently. Patient given education, support and encouragement to be active in his treatment plan. Patient observed interacting appropriately on the unit with staff and peers. Patient being monitored Q 15 minutes for safety per unit protocol. Patient remains safe on the unit.  
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
## 2019-10-12 NOTE — Plan of Care (Signed)
Data: Patient is appropriate and cooperative to assessment. Patient Denies SI/HI/ AVH. Patient has completed daily self inventory worksheet. Patient reports improving anxiety. Patient has no complaints and a pain rating of 0/10. Patient rates depression "7/10" , feelings of hopelessness "7/10" and anxiety "9/10" Patients goal for today is left blank today.  Action:  Q x 15 minute observation checks were completed for safety. Patient was provided with education on medications. Patient was offered support and encouragement. Patient was given scheduled medications. Patient  was encourage to attend groups, participate in unit activities and continue with plan of care.    Response: Patient is adherent with scheduled medications. Patient has no complaints at this time. Patient is receptive to treatment and safety maintained on unit.     Problem: Education: Goal: Knowledge of [place] [facility] Education information/materials will improve Outcome: Progressing Goal: Emotional status will improve Outcome: Progressing Goal: Mental status will improve Outcome: Progressing

## 2019-10-12 NOTE — Progress Notes (Signed)
Recreation Therapy Notes ° °Date: 10/12/2019 ° °Time: 9:30 am ° °Location: Room 21  ° °Behavioral response: Appropriate  ° °Intervention Topic: Animal Assisted Therapy  ° °Discussion/Intervention:  °Animal Assisted Therapy took place today during group.  Animal Assisted Therapy is the planned inclusion of an animal in a patient’s treatment plan. The patients were able to engage in therapy with an animal during group. Participants were educated on what a service dog is and the different between a support dog and a service dog. Patient were informed on the many animal needs there are and how their needs are similar. Individuals were enlightened on the process to get a service animal or support animal. Patients got the opportunity to pet the animal and were offered emotional support from the animal and staff.  °Clinical Observations/Feedback:  °Patient came to group and was on topic and was focused on what peers and staff had to say. Participant shared their experiences and history with animals. Individual was social with peers, staff and animal while participating in group.  °Sharone Picchi LRT/CTRS  ° ° ° ° ° ° ° °Genny Caulder °10/12/2019 11:13 AM °

## 2019-10-12 NOTE — Progress Notes (Signed)
Recreation Therapy Notes  INPATIENT RECREATION THERAPY ASSESSMENT  Patient Details Name: Nathan Koch MRN: [id number] DOB: 23-May-1990 Today's Date: 10/12/2019       Information Obtained From: Patient  Able to Participate in Assessment/Interview: Yes  Patient Presentation: Responsive  Reason for Admission (Per Patient): Active Symptoms, Substance Abuse  Patient Stressors:    Coping Skills:   Substance Abuse, Talk, Deep Breathing  Leisure Interests (2+):  Social - Family, Exercise - Walking, Music - Listen  Frequency of Recreation/Participation: Monthly  Awareness of Community Resources:  Yes  Community Resources:  Park  Current Use:    If no, Barriers?:    Expressed Interest in [employer] Information:    [place] of Residence:  Film/video editor  Patient Main Form of Transportation: Therapist, music  Patient Strengths:  Caring  Patient Identified Areas of Improvement:  Stop using  Patient Goal for Hospitalization:  Being more positive and less negative thinking  Current SI (including self-harm):  No  Current HI:  No  Current AVH: No  Staff Intervention Plan: Group Attendance, Collaborate with Interdisciplinary Treatment Team  Consent to Intern Participation: N/A  Latham Kinzler 10/12/2019, 2:59 PM

## 2019-10-12 NOTE — Tx Team (Addendum)
Interdisciplinary Treatment and Diagnostic Plan Update  10/12/2019 Time of Session: 9:00AM [name] MRN: [id number]  Principal Diagnosis: Depression, major, recurrent, severe with psychosis ([place])  Secondary Diagnoses: Principal Problem:   Depression, major, recurrent, severe with psychosis ([name]) Active Problems:   Asthma   Methamphetamine use ([name])   Current Medications:  Current Facility-Administered Medications  Medication Dose Route Frequency Provider Last Rate Last Admin  . acetaminophen (TYLENOL) tablet 650 mg  650 mg Oral Q6H PRN [name], MD      . albuterol (VENTOLIN HFA) 108 (90 Base) MCG/ACT inhaler 2 puff  2 puff Inhalation Q6H PRN Clapacs, [name], MD   2 puff at 10/11/19 0935  . alum & mag hydroxide-simeth (MAALOX/MYLANTA) 200-200-20 MG/5ML suspension 30 mL  30 mL Oral Q4H PRN [name], MD      . citalopram (CELEXA) tablet 20 mg  20 mg Oral Daily Clapacs, [name], MD   20 mg at 10/12/19 0815  . divalproex (DEPAKOTE) DR tablet 500 mg  500 mg Oral Q12H Clapacs, [name], MD   500 mg at 10/12/19 0815  . fluticasone furoate-vilanterol (BREO ELLIPTA) 200-25 MCG/INH 1 puff  1 puff Inhalation Daily Clapacs, [name], MD   1 puff at 10/12/19 0816  . hydrOXYzine (ATARAX/VISTARIL) tablet 25 mg  25 mg Oral Q6H PRN [name], NP   25 mg at 10/11/19 2150  . magnesium hydroxide (MILK OF MAGNESIA) suspension 30 mL  30 mL Oral Daily PRN [name], MD      . pneumococcal 23 valent vaccine (PNEUMOVAX-23) injection 0.5 mL  0.5 mL Intramuscular Tomorrow-1000 Clapacs, [name], MD      . QUEtiapine (SEROQUEL) tablet 100 mg  100 mg Oral QHS Clapacs, [name], MD   100 mg at 10/11/19 2150   PTA Medications: Medications Prior to Admission  Medication Sig Dispense Refill Last Dose  . albuterol (VENTOLIN HFA) 108 (90 Base) MCG/ACT inhaler Inhale 2 puffs into the lungs every 4 (four) hours as needed for wheezing or shortness of breath. 18 g 1   . citalopram (CELEXA)  20 MG tablet Take 1 tablet (20 mg total) by mouth at bedtime. (Patient taking differently: Take 20 mg by mouth every morning. ) 30 tablet 1   . divalproex (DEPAKOTE) 500 MG DR tablet Take 500 mg by mouth every 8 (eight) hours.     . Fluticasone-Salmeterol (ADVAIR) 250-50 MCG/DOSE AEPB Inhale 1 puff into the lungs daily at 6 (six) AM.     . QUEtiapine (SEROQUEL) 100 MG tablet Take 100 mg by mouth at bedtime.       Patient Stressors:    Patient Strengths:    Treatment Modalities: Medication Management, Group therapy, Case management,  1 to 1 session with clinician, Psychoeducation, Recreational therapy.   Physician Treatment Plan for Primary Diagnosis: Depression, major, recurrent, severe with psychosis ([name]) Long Term Goal(s): Improvement in symptoms so as ready for discharge Improvement in symptoms so as ready for discharge   Short Term Goals: Ability to disclose and discuss suicidal ideas Ability to demonstrate self-control will improve Compliance with prescribed medications will improve  Medication Management: Evaluate patient's response, side effects, and tolerance of medication regimen.  Therapeutic Interventions: 1 to 1 sessions, Unit Group sessions and Medication administration.  Evaluation of Outcomes: Progressing  Physician Treatment Plan for Secondary Diagnosis: Principal Problem:   Depression, major, recurrent, severe with psychosis ([name]) Active Problems:   Asthma   Methamphetamine use ([name])  Long Term Goal(s): Improvement in  symptoms so as ready for discharge Improvement in symptoms so as ready for discharge   Short Term Goals: Ability to disclose and discuss suicidal ideas Ability to demonstrate self-control will improve Compliance with prescribed medications will improve     Medication Management: Evaluate patient's response, side effects, and tolerance of medication regimen.  Therapeutic Interventions: 1 to 1 sessions, Unit Group sessions and Medication  administration.  Evaluation of Outcomes: Progressing   RN Treatment Plan for Primary Diagnosis: Depression, major, recurrent, severe with psychosis ([name]) Long Term Goal(s): Knowledge of disease and therapeutic regimen to maintain health will improve  Short Term Goals: Ability to demonstrate self-control, Ability to participate in decision making will improve, Ability to verbalize feelings will improve and Ability to identify and develop effective coping behaviors will improve  Medication Management: RN will administer medications as ordered by provider, will assess and evaluate patient's response and provide education to patient for prescribed medication. RN will report any adverse and/or side effects to prescribing provider.  Therapeutic Interventions: 1 on 1 counseling sessions, Psychoeducation, Medication administration, Evaluate responses to treatment, Monitor vital signs and CBGs as ordered, Perform/monitor CIWA, COWS, AIMS and Fall Risk screenings as ordered, Perform wound care treatments as ordered.  Evaluation of Outcomes: Progressing   LCSW Treatment Plan for Primary Diagnosis: Depression, major, recurrent, severe with psychosis ([name]) Long Term Goal(s): Safe transition to appropriate next level of care at discharge, Engage patient in therapeutic group addressing interpersonal concerns.  Short Term Goals: Engage patient in aftercare planning with referrals and resources, Increase social support, Increase ability to appropriately verbalize feelings, Increase emotional regulation and Increase skills for wellness and recovery  Therapeutic Interventions: Assess for all discharge needs, 1 to 1 time with Social worker, Explore available resources and support systems, Assess for adequacy in community support network, Educate family and significant other(s) on suicide prevention, Complete Psychosocial Assessment, Interpersonal group therapy.  Evaluation of Outcomes: Progressing   Progress  in Treatment: Attending groups: Yes. Participating in groups: Yes. Taking medication as prescribed: Yes. Toleration medication: Yes. Family/Significant other contact made: Yes, individual(s) contacted:  SPE completed with the pt. Patient understands diagnosis: Yes. Discussing patient identified problems/goals with staff: Yes. Medical problems stabilized or resolved: Yes. Denies suicidal/homicidal ideation: Yes. Issues/concerns per patient self-inventory: No. Other: none  New problem(s) identified: No, Describe:  none  New Short Term/Long Term Goal(s): detox, medication management for mood stabilization; elimination of SI thoughts; development of comprehensive mental wellness/sobriety plan.  Patient Goals:  "be more positive and less negative thinking"  Discharge Plan or Barriers: Patient reports plans to enter an long term treatment center.    Reason for Continuation of Hospitalization: Anxiety Depression Medication stabilization  Estimated Length of Stay: 1-7 days  Recreational Therapy: Patient: N/A Patient Goal: Patient will engage in groups without prompting or encouragement from LRT x3 group sessions within 5 recreation therapy group sessions.  Attendees: Patient: Nathan Koch 10/12/2019 10:13 AM  Physician: Dr. Toni Amend, MD 10/12/2019 10:13 AM  Nursing: Berna Spare 10/12/2019 10:13 AM  RN Care Manager: 10/12/2019 10:13 AM  Social Worker: Penni Homans, LCSW 10/12/2019 10:13 AM  Recreational Therapist: Garret Reddish, Drue Flirt, LRT 10/12/2019 10:13 AM  Other: Lowella Dandy, LCSW 10/12/2019 10:13 AM  Other:  10/12/2019 10:13 AM  Other: 10/12/2019 10:13 AM    Scribe for Treatment Team: Harden Mo, LCSW 10/12/2019 10:13 AM

## 2019-10-12 NOTE — BHH Counselor (Signed)
CSW faxed referrals to [facility], [facility] and [facility].  CSW will follow up with status of referrals later.  CSW has been attempting to contact [facility], however has not been able to speak with Theodoro Grist.  Penni Homans, MSW, LCSW 10/12/2019 2:17 PM

## 2019-10-12 NOTE — BHH Group Notes (Signed)
[facility] Group Notes:  (Nursing/MHT/Case Management/Adjunct)  Date:  10/12/2019  Time:  9:09 PM  Type of Therapy:  Group Therapy  Participation Level:  Active  Participation Quality:  Monopolizing and when ask his name he said Thayer Ohm than said his right name.  Affect:  Appropriate  Cognitive:  Alert  Insight:  Good  Engagement in Group:  Engaged  Modes of Intervention:  Support  Summary of Progress/Problems:  Mayra Neer 10/12/2019, 9:09 PM

## 2019-10-12 NOTE — BHH Group Notes (Signed)
Balance In Life 10/12/2019 9:30AM/1PM  Type of Therapy/Topic:  Group Therapy:  Balance in Life  Participation Level:  Did Not Attend  Description of Group:   This group will address the concept of balance and how it feels and looks when one is unbalanced. Patients will be encouraged to process areas in their lives that are out of balance and identify reasons for remaining unbalanced. Facilitators will guide patients in utilizing problem-solving interventions to address and correct the stressor making their life unbalanced. Understanding and applying boundaries will be explored and addressed for obtaining and maintaining a balanced life. Patients will be encouraged to explore ways to assertively make their unbalanced needs known to significant others in their lives, using other group members and facilitator for support and feedback.  Therapeutic Goals: 1. Patient will identify two or more emotions or situations they have that consume much of in their lives. 2. Patient will identify signs/triggers that life has become out of balance:  3. Patient will identify two ways to set boundaries in order to achieve balance in their lives:  4. Patient will demonstrate ability to communicate their needs through discussion and/or role plays  Summary of Patient Progress:    Therapeutic Modalities:   Cognitive Behavioral Therapy Solution-Focused Therapy Assertiveness Training  Jon Kasparek T Kachina Niederer, LCSW  

## 2019-10-12 NOTE — Progress Notes (Signed)
Patient alert and oriented no distress noted , thoughts are organized and coherent , he dennis SI/HI/AVH, interacting appropriately with peers and staff and complaint with medication regimen. No psychosis noted or loud outburst, 15 minutes safety checks maintained will continue to monitor.

## 2019-10-12 NOTE — Progress Notes (Signed)
[facility] MD Progress Note  10/12/2019 3:22 PM Nathan Koch  MRN:  [id number] Subjective: Patient seen chart reviewed.  Patient attended treatment team.  He reports that his mood is feeling better.  Denies any current suicidal or homicidal ideation.  Denies any current psychotic symptoms.  Compliant with medicine.  Has been pleasant and no difficulty on the unit.  He spoke with social work today and suggested that he would like to go to [facility]. as a possible option after discharge Principal Problem: Depression, major, recurrent, severe with psychosis ([name]) Diagnosis: Principal Problem:   Depression, major, recurrent, severe with psychosis ([name]) Active Problems:   Asthma   Methamphetamine use ([name])  Total Time spent with patient: 30 minutes  Past Psychiatric History: Past history of substance abuse recent stay at alcohol and drug abuse treatment center history of depression especially when actively abusing  Past Medical History:  Past Medical History:  Diagnosis Date  . Anxiety   . Asthma   . Depression   . Methamphetamine abuse [facility])     Past Surgical History:  Procedure Laterality Date  . CLOSED REDUCTION MANDIBLE N/A 07/02/2018   Procedure: CLOSED REDUCTION MANDIBULAR;  Surgeon: Vernie Murders, MD;  Location: [facility] ORS;  Service: ENT;  Laterality: N/A;   Family History:  Family History  Problem Relation Age of Onset  . Asthma Mother   . Asthma Brother    Family Psychiatric  History: See previous Social History:  Social History   Substance and Sexual Activity  Alcohol Use Not Currently  . Alcohol/week: 7.0 standard drinks  . Types: 7 Standard drinks or equivalent per week     Social History   Substance and Sexual Activity  Drug Use Yes  . Types: Methamphetamines    Social History   Socioeconomic History  . Marital status: Single    Spouse name: Not on file  . Number of children: Not on file  . Years of education: Not on file  . Highest education level: Not on file   Occupational History  . Not on file  Tobacco Use  . Smoking status: Current Every Day Smoker    Packs/day: 1.00    Types: Cigarettes  . Smokeless tobacco: Never Used  . Tobacco comment: started smoking again December 2019  Substance and Sexual Activity  . Alcohol use: Not Currently    Alcohol/week: 7.0 standard drinks    Types: 7 Standard drinks or equivalent per week  . Drug use: Yes    Types: Methamphetamines  . Sexual activity: Not on file  Other Topics Concern  . Not on file  Social History Narrative  . Not on file   Social Determinants of Health   Financial Resource Strain:   . Difficulty of Paying Living Expenses:   Food Insecurity:   . Worried About Programme researcher, broadcasting/film/video in the Last Year:   . Barista in the Last Year:   Transportation Needs:   . Freight forwarder (Medical):   Marland Kitchen Lack of Transportation (Non-Medical):   Physical Activity:   . Days of Exercise per Week:   . Minutes of Exercise per Session:   Stress:   . Feeling of Stress :   Social Connections:   . Frequency of Communication with Friends and Family:   . Frequency of Social Gatherings with Friends and Family:   . Attends Religious Services:   . Active Member of Clubs or Organizations:   . Attends Banker Meetings:   .  Marital Status:    Additional Social History:                         Sleep: Fair  Appetite:  Fair  Current Medications: Current Facility-Administered Medications  Medication Dose Route Frequency Provider Last Rate Last Admin  . acetaminophen (TYLENOL) tablet 650 mg  650 mg Oral Q6H PRN [name], MD      . albuterol (VENTOLIN HFA) 108 (90 Base) MCG/ACT inhaler 2 puff  2 puff Inhalation Q6H PRN Hadiyah Maricle, [name], MD   2 puff at 10/11/19 0935  . alum & mag hydroxide-simeth (MAALOX/MYLANTA) 200-200-20 MG/5ML suspension 30 mL  30 mL Oral Q4H PRN [name], MD      . citalopram (CELEXA) tablet 20 mg  20 mg Oral Daily Jessi Pitstick, [name],  MD   20 mg at 10/12/19 0815  . divalproex (DEPAKOTE) DR tablet 500 mg  500 mg Oral Q12H Romin Divita, [name], MD   500 mg at 10/12/19 0815  . fluticasone furoate-vilanterol (BREO ELLIPTA) 200-25 MCG/INH 1 puff  1 puff Inhalation Daily Jaslyne Beeck, [name], MD   1 puff at 10/12/19 0816  . hydrOXYzine (ATARAX/VISTARIL) tablet 25 mg  25 mg Oral Q6H PRN [name], NP   25 mg at 10/11/19 2150  . magnesium hydroxide (MILK OF MAGNESIA) suspension 30 mL  30 mL Oral Daily PRN [name], MD      . pneumococcal 23 valent vaccine (PNEUMOVAX-23) injection 0.5 mL  0.5 mL Intramuscular Tomorrow-1000 Margree Gimbel T, MD      . QUEtiapine (SEROQUEL) tablet 100 mg  100 mg Oral QHS Lon Klippel, [name], MD   100 mg at 10/11/19 2150    Lab Results: No results found for this or any previous visit (from the past 48 hour(s)).  Blood Alcohol level:  Lab Results  Component Value Date   ETH <10 10/09/2019   ETH <10 69/62/9528    Metabolic Disorder Labs: No results found for: HGBA1C, MPG No results found for: PROLACTIN Lab Results  Component Value Date   CHOL 156 07/11/2014   TRIG 178 (A) 07/11/2014   HDL 46 07/11/2014   [place] 74 07/11/2014    Physical Findings: AIMS:  , ,  ,  ,    CIWA:    COWS:     Musculoskeletal: Strength & Muscle Tone: within normal limits Gait & Station: normal Patient leans: N/A  Psychiatric Specialty Exam: Physical Exam  Nursing note and vitals reviewed. Constitutional: He appears well-developed.  HENT:  Head: Normocephalic and atraumatic.  Eyes: Pupils are equal, round, and reactive to light. Conjunctivae are normal.  Cardiovascular: Normal heart sounds.  Respiratory: Effort normal.  GI: Soft.  Musculoskeletal:        General: Normal range of motion.     Cervical back: Normal range of motion.  Neurological: He is alert.  Skin: Skin is warm and dry.  Psychiatric: Mood normal.    Review of Systems  Constitutional: Negative.   HENT: Negative.   Eyes:  Negative.   Respiratory: Negative.   Cardiovascular: Negative.   Gastrointestinal: Negative.   Musculoskeletal: Negative.   Skin: Negative.   Neurological: Negative.   Psychiatric/Behavioral: Negative.     Blood pressure 118/69, pulse (!) 56, temperature 97.8 F (36.6 C), temperature source Oral, resp. rate 17, height 5\' 8"  (1.727 m), weight 87.1 kg, SpO2 100 %.Body mass index is 29.19 kg/m.  General Appearance: Casual  Eye Contact:  Good  Speech:  Clear and Coherent  Volume:  Normal  Mood:  Euthymic  Affect:  Appropriate  Thought Process:  Goal Directed  Orientation:  Full (Time, Place, and Person)  Thought Content:  Logical  Suicidal Thoughts:  No  Homicidal Thoughts:  No  Memory:  Immediate;   Fair Recent;   Fair Remote;   Fair  Judgement:  Fair  Insight:  Fair  Psychomotor Activity:  Normal  Concentration:  Concentration: Fair  Recall:  [employer] of Knowledge:  Fair  Language:  Fair  Akathisia:  No  Handed:  Right  AIMS (if indicated):     Assets:  Desire for Improvement Housing Physical Health  ADL's:  Intact  Cognition:  WNL  Sleep:  Number of Hours: 6.5     Treatment Plan Summary: Daily contact with patient to assess and evaluate symptoms and progress in treatment, Medication management and Plan I filled out the information required by a doctor for [facility].  Encourage group attendance.  No change to medicine.  Possible discharge 1 to 2 days  Mordecai Rasmussen, MD 10/12/2019, 3:22 PM

## 2019-10-12 NOTE — BHH Suicide Risk Assessment (Signed)
BHH INPATIENT:  Family/Significant Other Suicide Prevention Education  Suicide Prevention Education:  Patient Refusal for Family/Significant Other Suicide Prevention Education: The patient Nathan Koch has refused to provide written consent for family/significant other to be provided Family/Significant Other Suicide Prevention Education during admission and/or prior to discharge.  Physician notified.  SPE completed with pt, as pt refused to consent to family contact. SPI pamphlet provided to pt and pt was encouraged to share information with support network, ask questions, and talk about any concerns relating to SPE. Pt denies access to guns/firearms and verbalized understanding of information provided. Mobile Crisis information also provided to pt.   Harden Mo 10/12/2019, 10:16 AM

## 2019-10-13 NOTE — Progress Notes (Signed)
Recreation Therapy Notes  Date: 10/13/2019  Time: 9:30 am   Location: Craft room    Behavioral response: N/A   Intervention Topic: Happiness    Discussion/Intervention: Patient did not attend group.   Clinical Observations/Feedback:  Patient did not attend group.   Jeshua Ransford LRT/CTRS        Nathan Koch 10/13/2019 12:39 PM

## 2019-10-13 NOTE — Plan of Care (Signed)
D- Patient alert and oriented. Patient presents in a pleasant mood on assessment stating that he slept "pretty good" last night and had no complaints to voice to this Clinical research associate. Patient endorsed both depression/anxiety, stating that "my buddy overdosed on Heroin", and "I always have anxiety" is why he's feeling this way. Patient denies SI, HI, AVH, and pain at this time. Patient's goal for today is "staying sober", in which he will "keep my wife in the mix of what I'm feeling and who I talk to", in order to achieve his goal.   A- Scheduled medications administered to patient, per MD orders. Support and encouragement provided.  Routine safety checks conducted every 15 minutes.  Patient informed to notify staff with problems or concerns.  R- No adverse drug reactions noted. Patient contracts for safety at this time. Patient compliant with medications and treatment plan. Patient receptive, calm, and cooperative. Patient interacts well with others on the unit.  Patient remains safe at this time.  Problem: Education: Goal: Knowledge of [facility] Education information/materials will improve Outcome: Progressing Goal: Emotional status will improve Outcome: Progressing Goal: Mental status will improve Outcome: Progressing Goal: Verbalization of understanding the information provided will improve Outcome: Progressing   Problem: Activity: Goal: Interest or engagement in activities will improve Outcome: Progressing Goal: Sleeping patterns will improve Outcome: Progressing   Problem: Coping: Goal: Ability to verbalize frustrations and anger appropriately will improve Outcome: Progressing Goal: Ability to demonstrate self-control will improve Outcome: Progressing   Problem: Health Behavior/Discharge Planning: Goal: Identification of resources available to assist in meeting health care needs will improve Outcome: Progressing Goal: Compliance with treatment plan for underlying cause of  condition will improve Outcome: Progressing   Problem: Physical Regulation: Goal: Ability to maintain clinical measurements within normal limits will improve Outcome: Progressing   Problem: Safety: Goal: Periods of time without injury will increase Outcome: Progressing

## 2019-10-13 NOTE — BHH Counselor (Signed)
CSW called Marylene Land at [place] back, [phone].  CSW left HIPAA compliant voicemail.  CSW called Aggie Moats at [place], [phone].  CSW was unable to speak with anyone or leave a message for a return call.   Penni Homans, MSW, LCSW 10/13/2019 2:47 PM

## 2019-10-13 NOTE — Progress Notes (Signed)
Patient alert and oriented x 4 no distress noted , thoughts are organized and coherent , he dennis SI/HI/AVH, interacting appropriately with peers and staff and complaint with medication regimen. No psychosis noted or loud outburst, 15 minutes safety checks maintained will continue to mon

## 2019-10-13 NOTE — BHH Counselor (Signed)
CSW attempted to follow up with [facility], however had to leave a HIPAA compliant voicemail.  Penni Homans, MSW, LCSW 10/13/2019 10:18 AM

## 2019-10-13 NOTE — BHH Group Notes (Signed)
BHH Group Notes:  (Nursing/MHT/Case Management/Adjunct)  Date:  10/13/2019  Time:  9:41 PM  Type of Therapy:  Group Therapy  Participation Level:  Active  Participation Quality:  Appropriate  Affect:  Appropriate  Cognitive:  Appropriate  Insight:  Appropriate  Engagement in Group:  Engaged  Modes of Intervention:  Discussion  Summary of Progress/Problems:  MHT engaged in night group activity asking patient what his goal was for the day and what is his greatest treasure, also providing snack. Patient stated that his goal for the day was to have a more positive outlook on life and that his greatest treasure was his daughter. Patient was receptive in receiving snack.   [name] 10/13/2019, 9:41 PM

## 2019-10-13 NOTE — BHH Counselor (Signed)
Patient is currently on the waiting list at [facility] per [facility].  CSW/pt need to call daily to check on status of referral.  Penni Homans, MSW, LCSW 10/13/2019 10:14 AM

## 2019-10-13 NOTE — Progress Notes (Signed)
[facility] MD Progress Note  10/13/2019 4:34 PM Nathan Koch  MRN:  [id number] Subjective: Patient seen chart reviewed.  Patient has no new complaints.  Says his mood is feeling good.  Denies suicidal ideation.  Requests increased food portions.  He is optimistic and hoping he will be able to go to inpatient substance abuse treatment Principal Problem: Depression, major, recurrent, severe with psychosis ([name]) Diagnosis: Principal Problem:   Depression, major, recurrent, severe with psychosis ([name]) Active Problems:   Asthma   Methamphetamine use ([name])  Total Time spent with patient: 30 minutes  Past Psychiatric History: Past history of substance abuse and mood symptoms  Past Medical History:  Past Medical History:  Diagnosis Date  . Anxiety   . Asthma   . Depression   . Methamphetamine abuse [facility])     Past Surgical History:  Procedure Laterality Date  . CLOSED REDUCTION MANDIBLE N/A 07/02/2018   Procedure: CLOSED REDUCTION MANDIBULAR;  Surgeon: Vernie Murders, MD;  Location: [facility] ORS;  Service: ENT;  Laterality: N/A;   Family History:  Family History  Problem Relation Age of Onset  . Asthma Mother   . Asthma Brother    Family Psychiatric  History: See previous Social History:  Social History   Substance and Sexual Activity  Alcohol Use Not Currently  . Alcohol/week: 7.0 standard drinks  . Types: 7 Standard drinks or equivalent per week     Social History   Substance and Sexual Activity  Drug Use Yes  . Types: Methamphetamines    Social History   Socioeconomic History  . Marital status: Single    Spouse name: Not on file  . Number of children: Not on file  . Years of education: Not on file  . Highest education level: Not on file  Occupational History  . Not on file  Tobacco Use  . Smoking status: Current Every Day Smoker    Packs/day: 1.00    Types: Cigarettes  . Smokeless tobacco: Never Used  . Tobacco comment: started smoking again December 2019  Substance  and Sexual Activity  . Alcohol use: Not Currently    Alcohol/week: 7.0 standard drinks    Types: 7 Standard drinks or equivalent per week  . Drug use: Yes    Types: Methamphetamines  . Sexual activity: Not on file  Other Topics Concern  . Not on file  Social History Narrative  . Not on file   Social Determinants of Health   Financial Resource Strain:   . Difficulty of Paying Living Expenses:   Food Insecurity:   . Worried About Programme researcher, broadcasting/film/video in the Last Year:   . Barista in the Last Year:   Transportation Needs:   . Freight forwarder (Medical):   Marland Kitchen Lack of Transportation (Non-Medical):   Physical Activity:   . Days of Exercise per Week:   . Minutes of Exercise per Session:   Stress:   . Feeling of Stress :   Social Connections:   . Frequency of Communication with Friends and Family:   . Frequency of Social Gatherings with Friends and Family:   . Attends Religious Services:   . Active Member of Clubs or Organizations:   . Attends Banker Meetings:   Marland Kitchen Marital Status:    Additional Social History:                         Sleep: Fair  Appetite:  Fair  Current Medications: Current Facility-Administered Medications  Medication Dose Route Frequency Provider Last Rate Last Admin  . acetaminophen (TYLENOL) tablet 650 mg  650 mg Oral Q6H PRN [name], MD      . albuterol (VENTOLIN HFA) 108 (90 Base) MCG/ACT inhaler 2 puff  2 puff Inhalation Q6H PRN Dondrell Loudermilk, [name], MD   2 puff at 10/11/19 0935  . alum & mag hydroxide-simeth (MAALOX/MYLANTA) 200-200-20 MG/5ML suspension 30 mL  30 mL Oral Q4H PRN [name], MD      . citalopram (CELEXA) tablet 20 mg  20 mg Oral Daily Khalil Szczepanik, [name], MD   20 mg at 10/13/19 0824  . divalproex (DEPAKOTE) DR tablet 500 mg  500 mg Oral Q12H Atleigh Gruen, [name], MD   500 mg at 10/13/19 0824  . fluticasone furoate-vilanterol (BREO ELLIPTA) 200-25 MCG/INH 1 puff  1 puff Inhalation Daily Maeby Vankleeck,  [name], MD   1 puff at 10/12/19 0816  . hydrOXYzine (ATARAX/VISTARIL) tablet 25 mg  25 mg Oral Q6H PRN [name], NP   25 mg at 10/12/19 2154  . magnesium hydroxide (MILK OF MAGNESIA) suspension 30 mL  30 mL Oral Daily PRN [name], MD      . pneumococcal 23 valent vaccine (PNEUMOVAX-23) injection 0.5 mL  0.5 mL Intramuscular Tomorrow-1000 Miabella Shannahan T, MD      . QUEtiapine (SEROQUEL) tablet 100 mg  100 mg Oral QHS Mackey Varricchio, [name], MD   100 mg at 10/12/19 2154    Lab Results: No results found for this or any previous visit (from the past 48 hour(s)).  Blood Alcohol level:  Lab Results  Component Value Date   ETH <10 10/09/2019   ETH <10 42/35/3614    Metabolic Disorder Labs: No results found for: HGBA1C, MPG No results found for: PROLACTIN Lab Results  Component Value Date   CHOL 156 07/11/2014   TRIG 178 (A) 07/11/2014   HDL 46 07/11/2014   [place] 74 07/11/2014    Physical Findings: AIMS:  , ,  ,  ,    CIWA:    COWS:     Musculoskeletal: Strength & Muscle Tone: within normal limits Gait & Station: normal Patient leans: N/A  Psychiatric Specialty Exam: Physical Exam  Nursing note and vitals reviewed. Constitutional: He appears well-developed.  HENT:  Head: Normocephalic and atraumatic.  Eyes: Pupils are equal, round, and reactive to light. Conjunctivae are normal.  Cardiovascular: Normal heart sounds.  Respiratory: Effort normal.  GI: Soft.  Musculoskeletal:        General: Normal range of motion.     Cervical back: Normal range of motion.  Neurological: He is alert.  Skin: Skin is warm and dry.  Psychiatric: Mood normal.    Review of Systems  Constitutional: Negative.   HENT: Negative.   Eyes: Negative.   Respiratory: Negative.   Cardiovascular: Negative.   Gastrointestinal: Negative.   Musculoskeletal: Negative.   Skin: Negative.   Neurological: Negative.   Psychiatric/Behavioral: Negative.     Blood pressure 111/63, pulse 68,  temperature 98 F (36.7 C), temperature source Oral, resp. rate 18, height 5\' 8"  (1.727 m), weight 87.1 kg, SpO2 99 %.Body mass index is 29.19 kg/m.  General Appearance: Casual  Eye Contact:  Good  Speech:  Clear and Coherent  Volume:  Normal  Mood:  Euthymic  Affect:  Congruent  Thought Process:  Goal Directed  Orientation:  Full (Time, Place, and Person)  Thought Content:  Logical  Suicidal Thoughts:  No  Homicidal Thoughts:  No  Memory:  Immediate;   Fair Recent;   Fair Remote;   Fair  Judgement:  Fair  Insight:  Fair  Psychomotor Activity:  Normal  Concentration:  Concentration: Fair  Recall:  [employer] of Knowledge:  Fair  Language:  Fair  Akathisia:  No  Handed:  Right  AIMS (if indicated):     Assets:  Desire for Improvement  ADL's:  Intact  Cognition:  WNL  Sleep:  Number of Hours: 7     Treatment Plan Summary: Plan Double portions ordered.  No change to current medicine.  Blood pressure is a little low but he is not dizzy or symptomatic.  We are still waiting for some response as to finding him a living situation or substance abuse facility.  Mordecai Rasmussen, MD 10/13/2019, 4:34 PM

## 2019-10-13 NOTE — BHH Counselor (Signed)
Per Vianne Bulls, [phone]. She has denied patient at this time.  She reports that she reviewed pt's information with the facility doctor.  Per them the patient's "mental health seems to be greater than his substance use".  They report that pt hasn't used recently per the referral.  Penni Homans, MSW, LCSW 10/13/2019 3:41 PM

## 2019-10-13 NOTE — Progress Notes (Signed)
Patient was pleasant.  Patient endorses anxiety and depression.  Received medications and tolerated without incident. Patient is safe with 15 minute safety checks. Informed to contact staff with any concerns.

## 2019-10-13 NOTE — BHH Group Notes (Signed)
LCSW Group Therapy Note  10/13/2019 1:00 PM  Type of Therapy/Topic:  Group Therapy:  Emotion Regulation  Participation Level:  Active   Description of Group:   The purpose of this group is to assist patients in learning to regulate negative emotions and experience positive emotions. Patients will be guided to discuss ways in which they have been vulnerable to their negative emotions. These vulnerabilities will be juxtaposed with experiences of positive emotions or situations, and patients will be challenged to use positive emotions to combat negative ones. Special emphasis will be placed on coping with negative emotions in conflict situations, and patients will process healthy conflict resolution skills.  Therapeutic Goals: 1. Patient will identify two positive emotions or experiences to reflect on in order to balance out negative emotions 2. Patient will label two or more emotions that they find the most difficult to experience 3. Patient will demonstrate positive conflict resolution skills through discussion and/or role plays  Summary of Patient Progress: Patient was present for group.  Patient was attentive and supportive of other group members.  Patient shared how he eats butterscotch candies when he feels overwhelmed.  He shares that he recites the [employer] and tries to make others laugh.    Therapeutic Modalities:   Cognitive Behavioral Therapy Feelings Identification Dialectical Behavioral Therapy  Penni Homans, MSW, LCSW 10/13/2019 2:26 PM

## 2019-10-14 NOTE — BHH Group Notes (Signed)
BHH LCSW Group Therapy Note ° °Date/Time: 10/14/2019 @ 1:30PM ° °Type of Therapy and Topic:  Group Therapy:  Overcoming Obstacles ° °Participation Level:  BHH PARTICIPATION LEVEL: Did Not Attend ° °Description of Group:   ° In this group patients will be encouraged to explore what they see as obstacles to their own wellness and recovery. They will be guided to discuss their thoughts, feelings, and behaviors related to these obstacles. The group will process together ways to cope with barriers, with attention given to specific choices patients can make. Each patient will be challenged to identify changes they are motivated to make in order to overcome their obstacles. This group will be process-oriented, with patients participating in exploration of their own experiences as well as giving and receiving support and challenge from other group members. ° °Therapeutic Goals: °1. Patient will identify personal and current obstacles as they relate to admission. °2. Patient will identify barriers that currently interfere with their wellness or overcoming obstacles.  °3. Patient will identify feelings, thought process and behaviors related to these barriers. °4. Patient will identify two changes they are willing to make to overcome these obstacles:  ° ° °Summary of Patient Progress ° ° °Unit did not have group due to going outside for rec time with tecs.  ° ° °Therapeutic Modalities:   °Cognitive Behavioral Therapy °Solution Focused Therapy °Motivational Interviewing °Relapse Prevention Therapy ° ° °Brylinn Teaney, LCSW °

## 2019-10-14 NOTE — BHH Counselor (Signed)
CSW contacted [facility] to obtain update regarding possible placement for the patient.  [facility] stated that the intake coordinator is out of the office today. [facility] stated to call on Monday for a pre-screen phone call for the patient.   No beds today or on Sunday. Possible beds during upcoming week.

## 2019-10-14 NOTE — Plan of Care (Signed)
D: Pt alert and oriented x 4. Pt rates depression 7/10, and anxiety 8/10. Pt denies experiencing any pain at this time. Pt denies experiencing any SI/HI, or AVH at this time.   A: Scheduled medications administered to pt, per MD orders. Support and encouragement provided. Frequent verbal contact made. Routine safety checks conducted q15 minutes.   R: No adverse drug reactions noted. Pt verbally contracts for safety at this time. Pt complaint with medications and treatment plan. Pt interacts well with others on the unit. Pt remains safe at this time. Will continue to monitor.   Problem: Education: Goal: Knowledge of [facility] Education information/materials will improve 10/14/2019 1053 by Sharin Mons, RN Outcome: Not Progressing 10/14/2019 1052 by Sharin Mons, RN Outcome: Not Progressing Goal: Emotional status will improve 10/14/2019 1053 by Sharin Mons, RN Outcome: Not Progressing 10/14/2019 1052 by Sharin Mons, RN Outcome: Not Progressing Goal: Mental status will improve 10/14/2019 1053 by Sharin Mons, RN Outcome: Not Progressing 10/14/2019 1052 by Sharin Mons, RN Outcome: Not Progressing Goal: Verbalization of understanding the information provided will improve 10/14/2019 1053 by Sharin Mons, RN Outcome: Not Progressing 10/14/2019 1052 by Sharin Mons, RN Outcome: Not Progressing   Problem: Activity: Goal: Interest or engagement in activities will improve 10/14/2019 1053 by Sharin Mons, RN Outcome: Not Progressing 10/14/2019 1052 by Sharin Mons, RN Outcome: Not Progressing Goal: Sleeping patterns will improve 10/14/2019 1053 by Sharin Mons, RN Outcome: Not Progressing 10/14/2019 1052 by Sharin Mons, RN Outcome: Not Progressing   Problem: Coping: Goal: Ability to verbalize frustrations and anger appropriately will improve 10/14/2019 1053 by Sharin Mons, RN Outcome: Not Progressing 10/14/2019 1052 by Sharin Mons, RN Outcome: Not Progressing Goal: Ability to demonstrate self-control will improve Outcome: Not Progressing   Problem: Health Behavior/Discharge Planning: Goal: Identification of resources available to assist in meeting health care needs will improve 10/14/2019 1053 by Sharin Mons, RN Outcome: Not Progressing 10/14/2019 1052 by Sharin Mons, RN Outcome: Not Progressing Goal: Compliance with treatment plan for underlying cause of condition will improve 10/14/2019 1053 by Sharin Mons, RN Outcome: Not Progressing 10/14/2019 1052 by Sharin Mons, RN Outcome: Not Progressing   Problem: Physical Regulation: Goal: Ability to maintain clinical measurements within normal limits will improve 10/14/2019 1053 by Sharin Mons, RN Outcome: Not Progressing 10/14/2019 1052 by Sharin Mons, RN Outcome: Not Progressing   Problem: Safety: Goal: Periods of time without injury will increase Outcome: Not Progressing

## 2019-10-14 NOTE — Progress Notes (Addendum)
[facility] MD Progress Note  10/14/2019 11:58 AM MOHD. DERFLINGER  MRN:  [id number] Subjective: Patient is a 29 year old male with a past psychiatric history significant for depression, opiate dependence, alcohol dependence and methamphetamine dependence who was admitted on 10/11/2019 secondary to suicidal ideation.  Objective: Patient is seen and examined.  Patient is a 29 year old male with the above-stated past psychiatric history seen in follow-up.  He has no complaints today.  Review of the electronic medical record revealed that he is awaiting placement in a rehabilitation facility.  He denied any suicidal, homicidal ideation.  He denied any auditory or visual hallucinations.  His vital signs are stable, he is afebrile.  He slept 7.5 hours last night.  He denied any side effects to his current medications.  Principal Problem: Depression, major, recurrent, severe with psychosis ([name]) Diagnosis: Principal Problem:   Depression, major, recurrent, severe with psychosis ([name]) Active Problems:   Asthma   Methamphetamine use ([name])  Total Time spent with patient: 15 minutes  Past Psychiatric History: See admission H&P  Past Medical History:  Past Medical History:  Diagnosis Date  . Anxiety   . Asthma   . Depression   . Methamphetamine abuse [facility])     Past Surgical History:  Procedure Laterality Date  . CLOSED REDUCTION MANDIBLE N/A 07/02/2018   Procedure: CLOSED REDUCTION MANDIBULAR;  Surgeon: Vernie Murders, MD;  Location: [facility] ORS;  Service: ENT;  Laterality: N/A;   Family History:  Family History  Problem Relation Age of Onset  . Asthma Mother   . Asthma Brother    Family Psychiatric  History: See admission H&P Social History:  Social History   Substance and Sexual Activity  Alcohol Use Not Currently  . Alcohol/week: 7.0 standard drinks  . Types: 7 Standard drinks or equivalent per week     Social History   Substance and Sexual Activity  Drug Use Yes  . Types: Methamphetamines     Social History   Socioeconomic History  . Marital status: Single    Spouse name: Not on file  . Number of children: Not on file  . Years of education: Not on file  . Highest education level: Not on file  Occupational History  . Not on file  Tobacco Use  . Smoking status: Current Every Day Smoker    Packs/day: 1.00    Types: Cigarettes  . Smokeless tobacco: Never Used  . Tobacco comment: started smoking again December 2019  Substance and Sexual Activity  . Alcohol use: Not Currently    Alcohol/week: 7.0 standard drinks    Types: 7 Standard drinks or equivalent per week  . Drug use: Yes    Types: Methamphetamines  . Sexual activity: Not on file  Other Topics Concern  . Not on file  Social History Narrative  . Not on file   Social Determinants of Health   Financial Resource Strain:   . Difficulty of Paying Living Expenses:   Food Insecurity:   . Worried About Programme researcher, broadcasting/film/video in the Last Year:   . Barista in the Last Year:   Transportation Needs:   . Freight forwarder (Medical):   Marland Kitchen Lack of Transportation (Non-Medical):   Physical Activity:   . Days of Exercise per Week:   . Minutes of Exercise per Session:   Stress:   . Feeling of Stress :   Social Connections:   . Frequency of Communication with Friends and Family:   . Frequency of Social Gatherings  with Friends and Family:   . Attends Religious Services:   . Active Member of Clubs or Organizations:   . Attends Archivist Meetings:   Marland Kitchen Marital Status:    Additional Social History:                         Sleep: Good  Appetite:  Good  Current Medications: Current Facility-Administered Medications  Medication Dose Route Frequency Provider Last Rate Last Admin  . acetaminophen (TYLENOL) tablet 650 mg  650 mg Oral Q6H PRN [name], MD      . albuterol (VENTOLIN HFA) 108 (90 Base) MCG/ACT inhaler 2 puff  2 puff Inhalation Q6H PRN Clapacs, [name], MD   2 puff at  10/14/19 [phone]  . alum & mag hydroxide-simeth (MAALOX/MYLANTA) 200-200-20 MG/5ML suspension 30 mL  30 mL Oral Q4H PRN [name], MD      . citalopram (CELEXA) tablet 20 mg  20 mg Oral Daily Clapacs, [name], MD   20 mg at 10/14/19 0811  . divalproex (DEPAKOTE) DR tablet 500 mg  500 mg Oral Q12H Clapacs, [name], MD   500 mg at 10/14/19 0811  . fluticasone furoate-vilanterol (BREO ELLIPTA) 200-25 MCG/INH 1 puff  1 puff Inhalation Daily Clapacs, [name], MD   1 puff at 10/14/19 0812  . hydrOXYzine (ATARAX/VISTARIL) tablet 25 mg  25 mg Oral Q6H PRN [name], NP   25 mg at 10/13/19 2127  . magnesium hydroxide (MILK OF MAGNESIA) suspension 30 mL  30 mL Oral Daily PRN [name], MD      . pneumococcal 23 valent vaccine (PNEUMOVAX-23) injection 0.5 mL  0.5 mL Intramuscular Tomorrow-1000 Clapacs, [name], MD      . QUEtiapine (SEROQUEL) tablet 100 mg  100 mg Oral QHS Clapacs, [name], MD   100 mg at 10/13/19 2127    Lab Results: No results found for this or any previous visit (from the past 48 hour(s)).  Blood Alcohol level:  Lab Results  Component Value Date   ETH <10 10/09/2019   ETH <10 36/64/4034    Metabolic Disorder Labs: No results found for: HGBA1C, MPG No results found for: PROLACTIN Lab Results  Component Value Date   CHOL 156 07/11/2014   TRIG 178 (A) 07/11/2014   HDL 46 07/11/2014   [place] 74 07/11/2014    Physical Findings: AIMS:  , ,  ,  ,    CIWA:    COWS:     Musculoskeletal: Strength & Muscle Tone: within normal limits Gait & Station: normal Patient leans: N/A  Psychiatric Specialty Exam: Physical Exam  Nursing note and vitals reviewed. Constitutional: He is oriented to person, place, and time.  HENT:  Head: Normocephalic and atraumatic.  Respiratory: Effort normal.  GI: Normal appearance.  Neurological: He is alert and oriented to person, place, and time.    Review of Systems  Blood pressure 99/61, pulse 64, temperature 97.8 F (36.6  C), temperature source Oral, resp. rate 18, height 5\' 8"  (1.727 m), weight 87.1 kg, SpO2 100 %.Body mass index is 29.19 kg/m.  General Appearance: Casual  Eye Contact:  Fair  Speech:  Normal Rate  Volume:  Normal  Mood:  Euthymic  Affect:  Congruent  Thought Process:  Coherent and Descriptions of Associations: Intact  Orientation:  Full (Time, Place, and Person)  Thought Content:  Logical  Suicidal Thoughts:  No  Homicidal Thoughts:  No  Memory:  Immediate;  Fair Recent;   Fair Remote;   Fair  Judgement:  Intact  Insight:  Fair  Psychomotor Activity:  Normal  Concentration:  Concentration: Good and Attention Span: Good  Recall:  Good  Fund of Knowledge:  Good  Language:  Good  Akathisia:  Negative  Handed:  Right  AIMS (if indicated):     Assets:  Desire for Improvement Resilience  ADL's:  Intact  Cognition:  WNL  Sleep:  Number of Hours: 7.5     Treatment Plan Summary: Daily contact with patient to assess and evaluate symptoms and progress in treatment, Medication management and Plan : Patient is seen and examined.  Patient is a 29 year old male with the above-stated past psychiatric history is seen in follow-up.   Diagnosis: 1.  Major depression 2.  Opiate dependence 3.  Alcohol dependence 4.  Methamphetamine dependence  Pertinent findings on examination today: 1.  No complaints today of depression, suicidal or homicidal ideation 2.  Awaiting placement in rehabilitation facility.  Plan: 1.  Continue Depakote 500 mg p.o. every 12 hours for mood stability. 2.  Continue Seroquel 100 mg p.o. nightly for mood stability. 3.  Continue Celexa 20 mg p.o. daily for anxiety and depression. 4.  Awaiting placement in rehabilitation facility.  Antonieta Pert, MD 10/14/2019, 11:58 AM

## 2019-10-15 NOTE — Progress Notes (Signed)
Patient is pleasant and easy to engage. Endorses anxiety and depression and denies SI/HI/AVH.  He received his medications and tolerate without incident.  He is active on the unit and engaged well with other peers.  He remains safe on the unit with 15 minute safety checks. He was informed to contact staff with any concerns.

## 2019-10-15 NOTE — [facility] Group Notes (Signed)
LCSW Group Therapy Note  Date/Time:  10/15/2019  1:00PM  Type of Therapy and Topic:  Group Therapy:  Healthy and Unhealthy Supports  Participation Level:  Active   Description of Group:  Patients in this group were introduced to the idea of adding a variety of healthy supports to address the various needs in their lives.Patients discussed what additional healthy supports could be helpful in their recovery and wellness after discharge in order to prevent future hospitalizations.   An emphasis was placed on using counselor, doctor, therapy groups, 12-step groups, and problem-specific support groups to expand supports.  They also worked as a group on developing a specific plan for several patients to deal with unhealthy supports through boundary-setting, psychoeducation with loved ones, and even termination of relationships.   Therapeutic Goals:   1)  discuss importance of adding supports to stay well once out of the hospital  2)  compare healthy versus unhealthy supports and identify some examples of each  3)  generate ideas and descriptions of healthy supports that can be added  4)  offer mutual support about how to address unhealthy supports  5)  encourage active participation in and adherence to discharge plan    Summary of Patient Progress:  The patient stated that current healthy supports in his life are his fiance and his aunt while current unhealthy supports include his friends who he used to do drugs wtih.  The patient expressed a willingness to add additional healthy supports to help in his recovery journey.   Therapeutic Modalities:   Motivational Interviewing Brief Solution-Focused Therapy   [name], MSW, LCSW

## 2019-10-15 NOTE — Progress Notes (Signed)
Patient pleasant during assessment denying SI/HI/AVH and pain. Patient endorses anxiety and depression stemming from the loss of a close friend recently. Patient given education, support and encouragement to be active in his treatment plan. Patient observed interacting appropriately on the unit with staff and peers. Patient being monitored Q 15 minutes for safety per unit protocol. Patient remains safe on the unit.

## 2019-10-15 NOTE — Plan of Care (Signed)
D- Patient alert and oriented. Patient presents in a pleasant mood on assessment stating that he slept good last night and had no complaints to voice to this Clinical research associate. Patient continues to endorse both depression and anxiety, rating them a "7/10" and "9/10". Patient stated that he always has anxiety and "I get to thinking about old stuff", which brings up his depression. Patient denies SI, HI, AVH, and pain at this time. Patient had no stated goals for today.  A- Scheduled medications administered to patient, per MD orders. Support and encouragement provided.  Routine safety checks conducted every 15 minutes.  Patient informed to notify staff with problems or concerns.  R- No adverse drug reactions noted. Patient contracts for safety at this time. Patient compliant with medications and treatment plan. Patient receptive, calm, and cooperative. Patient interacts well with others on the unit.  Patient remains safe at this time.  Problem: Education: Goal: Knowledge of [facility] Education information/materials will improve Outcome: Progressing Goal: Emotional status will improve Outcome: Progressing Goal: Mental status will improve Outcome: Progressing Goal: Verbalization of understanding the information provided will improve Outcome: Progressing   Problem: Activity: Goal: Interest or engagement in activities will improve Outcome: Progressing Goal: Sleeping patterns will improve Outcome: Progressing   Problem: Coping: Goal: Ability to verbalize frustrations and anger appropriately will improve Outcome: Progressing Goal: Ability to demonstrate self-control will improve Outcome: Progressing   Problem: Health Behavior/Discharge Planning: Goal: Identification of resources available to assist in meeting health care needs will improve Outcome: Progressing Goal: Compliance with treatment plan for underlying cause of condition will improve Outcome: Progressing   Problem: Physical  Regulation: Goal: Ability to maintain clinical measurements within normal limits will improve Outcome: Progressing   Problem: Safety: Goal: Periods of time without injury will increase Outcome: Progressing

## 2019-10-15 NOTE — Progress Notes (Signed)
[facility] MD Progress Note  10/15/2019 10:41 AM Nathan Koch  MRN:  [id number] Subjective:  Patient is a 29 year old male with a past psychiatric history significant for depression, opiate dependence, alcohol dependence and methamphetamine dependence who was admitted on 10/11/2019 secondary to suicidal ideation.  Objective: Patient is seen and examined.  Patient is a 29 year old male with the above-stated past psychiatric history who is seen in follow-up.  He is essentially unchanged from yesterday.  He denied any suicidal or homicidal ideation.  He denied any auditory or visual hallucinations.  He is awaiting placement at a substance abuse rehabilitation facility.  His vital signs are stable, he is afebrile.  He slept 7.5 hours.  No new laboratories.  Principal Problem: Depression, major, recurrent, severe with psychosis ([name]) Diagnosis: Principal Problem:   Depression, major, recurrent, severe with psychosis ([name]) Active Problems:   Asthma   Methamphetamine use ([name])  Total Time spent with patient: 15 minutes  Past Psychiatric History: See admission H&P  Past Medical History:  Past Medical History:  Diagnosis Date  . Anxiety   . Asthma   . Depression   . Methamphetamine abuse [facility])     Past Surgical History:  Procedure Laterality Date  . CLOSED REDUCTION MANDIBLE N/A 07/02/2018   Procedure: CLOSED REDUCTION MANDIBULAR;  Surgeon: Vernie Murders, MD;  Location: [facility] ORS;  Service: ENT;  Laterality: N/A;   Family History:  Family History  Problem Relation Age of Onset  . Asthma Mother   . Asthma Brother    Family Psychiatric  History: See admission H&P Social History:  Social History   Substance and Sexual Activity  Alcohol Use Not Currently  . Alcohol/week: 7.0 standard drinks  . Types: 7 Standard drinks or equivalent per week     Social History   Substance and Sexual Activity  Drug Use Yes  . Types: Methamphetamines    Social History   Socioeconomic History  . Marital  status: Single    Spouse name: Not on file  . Number of children: Not on file  . Years of education: Not on file  . Highest education level: Not on file  Occupational History  . Not on file  Tobacco Use  . Smoking status: Current Every Day Smoker    Packs/day: 1.00    Types: Cigarettes  . Smokeless tobacco: Never Used  . Tobacco comment: started smoking again December 2019  Substance and Sexual Activity  . Alcohol use: Not Currently    Alcohol/week: 7.0 standard drinks    Types: 7 Standard drinks or equivalent per week  . Drug use: Yes    Types: Methamphetamines  . Sexual activity: Not on file  Other Topics Concern  . Not on file  Social History Narrative  . Not on file   Social Determinants of Health   Financial Resource Strain:   . Difficulty of Paying Living Expenses:   Food Insecurity:   . Worried About Programme researcher, broadcasting/film/video in the Last Year:   . Barista in the Last Year:   Transportation Needs:   . Freight forwarder (Medical):   Marland Kitchen Lack of Transportation (Non-Medical):   Physical Activity:   . Days of Exercise per Week:   . Minutes of Exercise per Session:   Stress:   . Feeling of Stress :   Social Connections:   . Frequency of Communication with Friends and Family:   . Frequency of Social Gatherings with Friends and Family:   . Attends Religious  Services:   . Active Member of Clubs or Organizations:   . Attends Archivist Meetings:   Marland Kitchen Marital Status:    Additional Social History:                         Sleep: Good  Appetite:  Good  Current Medications: Current Facility-Administered Medications  Medication Dose Route Frequency Provider Last Rate Last Admin  . acetaminophen (TYLENOL) tablet 650 mg  650 mg Oral Q6H PRN [name], MD      . albuterol (VENTOLIN HFA) 108 (90 Base) MCG/ACT inhaler 2 puff  2 puff Inhalation Q6H PRN Clapacs, [name], MD   2 puff at 10/14/19 [phone]  . alum & mag hydroxide-simeth  (MAALOX/MYLANTA) 200-200-20 MG/5ML suspension 30 mL  30 mL Oral Q4H PRN [name], MD      . citalopram (CELEXA) tablet 20 mg  20 mg Oral Daily Clapacs, [name], MD   20 mg at 10/15/19 0831  . divalproex (DEPAKOTE) DR tablet 500 mg  500 mg Oral Q12H Clapacs, [name], MD   500 mg at 10/15/19 0831  . fluticasone furoate-vilanterol (BREO ELLIPTA) 200-25 MCG/INH 1 puff  1 puff Inhalation Daily Clapacs, [name], MD   1 puff at 10/15/19 0832  . hydrOXYzine (ATARAX/VISTARIL) tablet 25 mg  25 mg Oral Q6H PRN [name], NP   25 mg at 10/14/19 2159  . magnesium hydroxide (MILK OF MAGNESIA) suspension 30 mL  30 mL Oral Daily PRN [name], MD      . pneumococcal 23 valent vaccine (PNEUMOVAX-23) injection 0.5 mL  0.5 mL Intramuscular Tomorrow-1000 Clapacs, [name], MD      . QUEtiapine (SEROQUEL) tablet 100 mg  100 mg Oral QHS Clapacs, [name], MD   100 mg at 10/14/19 2159    Lab Results: No results found for this or any previous visit (from the past 48 hour(s)).  Blood Alcohol level:  Lab Results  Component Value Date   ETH <10 10/09/2019   ETH <10 51/88/4166    Metabolic Disorder Labs: No results found for: HGBA1C, MPG No results found for: PROLACTIN Lab Results  Component Value Date   CHOL 156 07/11/2014   TRIG 178 (A) 07/11/2014   HDL 46 07/11/2014   [place] 74 07/11/2014    Physical Findings: AIMS:  , ,  ,  ,    CIWA:    COWS:     Musculoskeletal: Strength & Muscle Tone: within normal limits Gait & Station: normal Patient leans: N/A  Psychiatric Specialty Exam: Physical Exam  Nursing note and vitals reviewed. Constitutional: He is oriented to person, place, and time.  HENT:  Head: Normocephalic and atraumatic.  Respiratory: Effort normal.  GI: Normal appearance.  Neurological: He is alert and oriented to person, place, and time.    Review of Systems  Blood pressure 108/69, pulse 69, temperature 97.7 F (36.5 C), temperature source Oral, resp. rate 18,  height 5\' 8"  (1.727 m), weight 87.1 kg, SpO2 98 %.Body mass index is 29.19 kg/m.  General Appearance: Casual  Eye Contact:  Fair  Speech:  Normal Rate  Volume:  Normal  Mood:  Euthymic  Affect:  Congruent  Thought Process:  Coherent and Descriptions of Associations: Intact  Orientation:  Full (Time, Place, and Person)  Thought Content:  Logical  Suicidal Thoughts:  No  Homicidal Thoughts:  No  Memory:  Immediate;   Fair Recent;   Fair Remote;  Fair  Judgement:  Intact  Insight:  Fair  Psychomotor Activity:  Normal  Concentration:  Concentration: Good and Attention Span: Good  Recall:  Fair  Fund of Knowledge:  Good  Language:  Good  Akathisia:  Negative  Handed:  Right  AIMS (if indicated):     Assets:  Desire for Improvement Resilience  ADL's:  Intact  Cognition:  WNL  Sleep:  Number of Hours: 7.5     Treatment Plan Summary: Daily contact with patient to assess and evaluate symptoms and progress in treatment, Medication management and Plan : Patient is seen and examined.  Patient is a 29 year old male with the above-stated past psychiatric history who is seen in follow-up.   Diagnosis: 1.  Major depression 2.  Opiate dependence 3.  Alcohol dependence 4.  Methamphetamine dependence  Pertinent findings on examination today: 1.  No complaints today of depression, suicidal or homicidal ideation 2.  Awaiting placement in rehabilitation facility.  Plan: 1.  Continue Depakote 500 mg p.o. every 12 hours for mood stability. 2.  Continue Seroquel 100 mg p.o. nightly for mood stability. 3.  Continue Celexa 20 mg p.o. daily for anxiety and depression. 4.  Order Depakote level, liver function enzymes, CBC in a.m. tomorrow. 5.  Disposition planning: Awaiting placement in rehabilitation facility. Antonieta Pert, MD 10/15/2019, 10:41 AM

## 2019-10-15 NOTE — Plan of Care (Signed)
  Problem: Education: Goal: Knowledge of [facility] Education information/materials will improve Outcome: Progressing Goal: Emotional status will improve Outcome: Progressing Goal: Mental status will improve Outcome: Progressing Goal: Verbalization of understanding the information provided will improve Outcome: Progressing   Problem: Activity: Goal: Interest or engagement in activities will improve Outcome: Progressing Goal: Sleeping patterns will improve Outcome: Progressing   Problem: Coping: Goal: Ability to verbalize frustrations and anger appropriately will improve Outcome: Progressing Goal: Ability to demonstrate self-control will improve Outcome: Progressing   Problem: Health Behavior/Discharge Planning: Goal: Identification of resources available to assist in meeting health care needs will improve Outcome: Progressing Goal: Compliance with treatment plan for underlying cause of condition will improve Outcome: Progressing   Problem: Physical Regulation: Goal: Ability to maintain clinical measurements within normal limits will improve Outcome: Progressing   Problem: Safety: Goal: Periods of time without injury will increase Outcome: Progressing   

## 2019-10-15 NOTE — Plan of Care (Signed)
Patient calm, pleasant and cooperative on the unit  Problem: Education: Goal: Emotional status will improve Outcome: Progressing Goal: Mental status will improve Outcome: Progressing

## 2019-10-16 LAB — CBC WITH DIFFERENTIAL/PLATELET
Abs Immature Granulocytes: 0.02 10*3/uL (ref 0.00–0.07)
Basophils Absolute: 0 10*3/uL (ref 0.0–0.1)
Basophils Relative: 1 %
Eosinophils Absolute: 0.4 10*3/uL (ref 0.0–0.5)
Eosinophils Relative: 6 %
HCT: 41.3 % (ref 39.0–52.0)
Hemoglobin: 13.7 g/dL (ref 13.0–17.0)
Immature Granulocytes: 0 %
Lymphocytes Relative: 27 %
Lymphs Abs: 1.8 10*3/uL (ref 0.7–4.0)
MCH: 28.7 pg (ref 26.0–34.0)
MCHC: 33.2 g/dL (ref 30.0–36.0)
MCV: 86.6 fL (ref 80.0–100.0)
Monocytes Absolute: 0.7 10*3/uL (ref 0.1–1.0)
Monocytes Relative: 11 %
Neutro Abs: 3.8 10*3/uL (ref 1.7–7.7)
Neutrophils Relative %: 55 %
Platelets: 252 10*3/uL (ref 150–400)
RBC: 4.77 MIL/uL (ref 4.22–5.81)
RDW: 13.2 % (ref 11.5–15.5)
WBC: 6.6 10*3/uL (ref 4.0–10.5)
nRBC: 0 % (ref 0.0–0.2)

## 2019-10-16 LAB — HEPATIC FUNCTION PANEL
ALT: 12 U/L (ref 0–44)
AST: 17 U/L (ref 15–41)
Albumin: 3.7 g/dL (ref 3.5–5.0)
Alkaline Phosphatase: 57 U/L (ref 38–126)
Bilirubin, Direct: <0.1 mg/dL (ref 0.0–0.2)
Total Bilirubin: 0.6 mg/dL (ref 0.3–1.2)
Total Protein: 6.8 g/dL (ref 6.5–8.1)

## 2019-10-16 LAB — VALPROIC ACID LEVEL: Valproic Acid Lvl: 62 ug/mL (ref 50.0–100.0)

## 2019-10-16 NOTE — Progress Notes (Signed)
CSW informed this Clinical research associate that patient needs to contact [facility] at 8am tomorrow morning. This Clinical research associate will notify the oncoming shift so that the message can be relayed to the morning shift nurse.

## 2019-10-16 NOTE — [facility] Group Notes (Signed)
[facility] Group Notes:  (Nursing/MHT/Case Management/Adjunct)  Date:  10/16/2019  Time:  10:49 AM  Type of Therapy:  COMMUNITY MEETING  Participation Level:  Did Not Attend   Summary of Progress/Problems:  Kerrie Pleasure 10/16/2019, 10:49 AM

## 2019-10-16 NOTE — [facility] Counselor (Signed)
CSW called [facility] with pt present so pt could ask questions of the Dietitian.   Pt confirmed that he completed the phone screening with [facility]. Pt reports that he is apprehensive due to the distance from family.  Penni Homans, MSW, LCSW 10/16/2019 2:54 PM

## 2019-10-16 NOTE — Progress Notes (Signed)
[facility] MD Progress Note  10/16/2019 4:24 PM Nathan Koch  MRN:  [id number] Subjective: Follow-up patient with depression and substance abuse.  Patient says he is feeling fine.  No complaints.  Nothing he particularly is needing.  He is under the impression that he will be going to [place] in the next day or so.  I am not certain if this is true.  Meanwhile he takes his medicine takes care of his basic hygiene and is no trouble on the ward. Principal Problem: Depression, major, recurrent, severe with psychosis ([place]) Diagnosis: Principal Problem:   Depression, major, recurrent, severe with psychosis ([place]) Active Problems:   Asthma   Methamphetamine use ([place])  Total Time spent with patient: 30 minutes  Past Psychiatric History: No past history beyond the recent admissions for substance abuse and depression  Past Medical History:  Past Medical History:  Diagnosis Date  . Anxiety   . Asthma   . Depression   . Methamphetamine abuse [facility])     Past Surgical History:  Procedure Laterality Date  . CLOSED REDUCTION MANDIBLE N/A 07/02/2018   Procedure: CLOSED REDUCTION MANDIBULAR;  Surgeon: Margaretha Sheffield, MD;  Location: [facility] ORS;  Service: ENT;  Laterality: N/A;   Family History:  Family History  Problem Relation Age of Onset  . Asthma Mother   . Asthma Brother    Family Psychiatric  History: See previous Social History:  Social History   Substance and Sexual Activity  Alcohol Use Not Currently  . Alcohol/week: 7.0 standard drinks  . Types: 7 Standard drinks or equivalent per week     Social History   Substance and Sexual Activity  Drug Use Yes  . Types: Methamphetamines    Social History   Socioeconomic History  . Marital status: Single    Spouse name: Not on file  . Number of children: Not on file  . Years of education: Not on file  . Highest education level: Not on file  Occupational History  . Not on file  Tobacco Use  . Smoking status: Current Every Day Smoker     Packs/day: 1.00    Types: Cigarettes  . Smokeless tobacco: Never Used  . Tobacco comment: started smoking again December 2019  Substance and Sexual Activity  . Alcohol use: Not Currently    Alcohol/week: 7.0 standard drinks    Types: 7 Standard drinks or equivalent per week  . Drug use: Yes    Types: Methamphetamines  . Sexual activity: Not on file  Other Topics Concern  . Not on file  Social History Narrative  . Not on file   Social Determinants of Health   Financial Resource Strain:   . Difficulty of Paying Living Expenses:   Food Insecurity:   . Worried About Charity fundraiser in the Last Year:   . Arboriculturist in the Last Year:   Transportation Needs:   . Film/video editor (Medical):   Marland Kitchen Lack of Transportation (Non-Medical):   Physical Activity:   . Days of Exercise per Week:   . Minutes of Exercise per Session:   Stress:   . Feeling of Stress :   Social Connections:   . Frequency of Communication with Friends and Family:   . Frequency of Social Gatherings with Friends and Family:   . Attends Religious Services:   . Active Member of Clubs or Organizations:   . Attends Archivist Meetings:   Marland Kitchen Marital Status:    Additional Social History:  Sleep: Fair  Appetite:  Fair  Current Medications: Current Facility-Administered Medications  Medication Dose Route Frequency Provider Last Rate Last Admin  . acetaminophen (TYLENOL) tablet 650 mg  650 mg Oral Q6H PRN Roselind Messier, MD      . albuterol (VENTOLIN HFA) 108 (90 Base) MCG/ACT inhaler 2 puff  2 puff Inhalation Q6H PRN Latriece Anstine, Jackquline Denmark, MD   2 puff at 10/16/19 0820  . alum & mag hydroxide-simeth (MAALOX/MYLANTA) 200-200-20 MG/5ML suspension 30 mL  30 mL Oral Q4H PRN Roselind Messier, MD      . citalopram (CELEXA) tablet 20 mg  20 mg Oral Daily Jaskarn Schweer, Jackquline Denmark, MD   20 mg at 10/16/19 0819  . divalproex (DEPAKOTE) DR tablet 500 mg  500 mg Oral Q12H Jayion Schneck, Jackquline Denmark, MD   500 mg at 10/16/19 0819  . fluticasone furoate-vilanterol (BREO ELLIPTA) 200-25 MCG/INH 1 puff  1 puff Inhalation Daily Shervon Kerwin, Jackquline Denmark, MD   1 puff at 10/15/19 0832  . hydrOXYzine (ATARAX/VISTARIL) tablet 25 mg  25 mg Oral Q6H PRN Gillermo Murdoch, NP   25 mg at 10/15/19 2108  . magnesium hydroxide (MILK OF MAGNESIA) suspension 30 mL  30 mL Oral Daily PRN Roselind Messier, MD      . pneumococcal 23 valent vaccine (PNEUMOVAX-23) injection 0.5 mL  0.5 mL Intramuscular Tomorrow-1000 Yeslin Delio T, MD      . QUEtiapine (SEROQUEL) tablet 100 mg  100 mg Oral QHS Leotha Westermeyer, Jackquline Denmark, MD   100 mg at 10/15/19 2107    Lab Results:  Results for orders placed or performed during the hospital encounter of 10/10/19 (from the past 48 hour(s))  Valproic acid level     Status: None   Collection Time: 10/16/19  8:51 AM  Result Value Ref Range   Valproic Acid Lvl 62 50.0 - 100.0 ug/mL    Comment: Performed at [facility], [address] [place], [place] [address]  CBC with Differential/Platelet     Status: None   Collection Time: 10/16/19  8:51 AM  Result Value Ref Range   WBC 6.6 4.0 - 10.5 K/uL   RBC 4.77 4.22 - 5.81 MIL/uL   Hemoglobin 13.7 13.0 - 17.0 g/dL   HCT 05.3 39 - 52 %   MCV 86.6 80.0 - 100.0 fL   MCH 28.7 26.0 - 34.0 pg   MCHC 33.2 30.0 - 36.0 g/dL   RDW 97.6 73.4 - 19.3 %   Platelets 252 150 - 400 K/uL   nRBC 0.0 0.0 - 0.2 %   Neutrophils Relative % 55 %   Neutro Abs 3.8 1.7 - 7.7 K/uL   Lymphocytes Relative 27 %   Lymphs Abs 1.8 0.7 - 4.0 K/uL   Monocytes Relative 11 %   Monocytes Absolute 0.7 0 - 1 K/uL   Eosinophils Relative 6 %   Eosinophils Absolute 0.4 0 - 0 K/uL   Basophils Relative 1 %   Basophils Absolute 0.0 0 - 0 K/uL   Immature Granulocytes 0 %   Abs Immature Granulocytes 0.02 0.00 - 0.07 K/uL    Comment: Performed at [facility], [address] [place], [place] [address]  Hepatic function panel     Status: None   Collection Time:  10/16/19  8:51 AM  Result Value Ref Range   Total Protein 6.8 6.5 - 8.1 g/dL   Albumin 3.7 3.5 - 5.0 g/dL   AST 17 15 - 41 U/L   ALT 12 0 -  44 U/L   Alkaline Phosphatase 57 38 - 126 U/L   Total Bilirubin 0.6 0.3 - 1.2 mg/dL   Bilirubin, Direct <1.6 0.0 - 0.2 mg/dL   Indirect Bilirubin NOT CALCULATED 0.3 - 0.9 mg/dL    Comment: Performed at [facility], [address] [place], [place] [address]    Blood Alcohol level:  Lab Results  Component Value Date   [facility] <10 10/09/2019   ETH <10 09/13/2019    Metabolic Disorder Labs: No results found for: HGBA1C, MPG No results found for: PROLACTIN Lab Results  Component Value Date   CHOL 156 07/11/2014   TRIG 178 (A) 07/11/2014   HDL 46 07/11/2014   LDLCALC 74 07/11/2014    Physical Findings: AIMS:  , ,  ,  ,    CIWA:    COWS:     Musculoskeletal: Strength & Muscle Tone: within normal limits Gait & Station: normal Patient leans: N/A  Psychiatric Specialty Exam: Physical Exam  Nursing note and vitals reviewed. Constitutional: He appears well-developed.  HENT:  Head: Normocephalic and atraumatic.  Eyes: Pupils are equal, round, and reactive to light. Conjunctivae are normal.  Cardiovascular: Normal heart sounds.  Respiratory: Effort normal.  GI: Soft.  Musculoskeletal:        General: Normal range of motion.     Cervical back: Normal range of motion.  Neurological: He is alert.  Skin: Skin is warm and dry.  Psychiatric: Mood normal.    Review of Systems  Constitutional: Negative.   HENT: Negative.   Eyes: Negative.   Respiratory: Negative.   Cardiovascular: Negative.   Gastrointestinal: Negative.   Musculoskeletal: Negative.   Skin: Negative.   Neurological: Negative.   Psychiatric/Behavioral: Negative.     Blood pressure 107/71, pulse 78, temperature 97.9 F (36.6 C), temperature source Oral, resp. rate 18, height 5\' 8"  (1.727 m), weight 87.1 kg, SpO2 99 %.Body mass index is 29.19 kg/m.  General  Appearance: Casual  Eye Contact:  Fair  Speech:  Clear and Coherent  Volume:  Normal  Mood:  Anxious  Affect:  Congruent  Thought Process:  Goal Directed  Orientation:  Full (Time, Place, and Person)  Thought Content:  Logical  Suicidal Thoughts:  No  Homicidal Thoughts:  No  Memory:  Immediate;   Fair Recent;   Fair Remote;   Fair  Judgement:  Fair  Insight:  Fair  Psychomotor Activity:  Normal  Concentration:  Concentration: Fair  Recall:  Fair  Fund of Knowledge:  Good  Language:  Fair  Akathisia:  No  Handed:  Right  AIMS (if indicated):     Assets:  Desire for Improvement  ADL's:  Intact  Cognition:  WNL  Sleep:  Number of Hours: 7.75     Treatment Plan Summary: Plan No change to medication.  Encouragement and support.  I we are hoping for some kind of placement plan in the next 1 to 2 days  , MD 10/16/2019, 4:24 PM

## 2019-10-16 NOTE — Plan of Care (Signed)
D- Patient alert and oriented. Patient presents in a pleasant mood on assessment stating that he slept "pretty well" last night and had no complaints to voice to this Clinical research associate. Patient continues to endorse depression/anxiety reporting "I have depression and anxiety disorder, so I can be depressed/anxious about nothing". Patient denies SI, HI, AVH, and pain at this time. Patient's goal for today is "staying positive", in which he will "be more active", in order to achieve her goal.  A- Scheduled medications administered to patient, per MD orders. Support and encouragement provided.  Routine safety checks conducted every 15 minutes.  Patient informed to notify staff with problems or concerns.  R- No adverse drug reactions noted. Patient contracts for safety at this time. Patient compliant with medications and treatment plan. Patient receptive, calm, and cooperative. Patient interacts well with others on the unit.  Patient remains safe at this time.  Problem: Education: Goal: Knowledge of [facility] Education information/materials will improve Outcome: Progressing Goal: Emotional status will improve Outcome: Progressing Goal: Mental status will improve Outcome: Progressing Goal: Verbalization of understanding the information provided will improve Outcome: Progressing   Problem: Activity: Goal: Interest or engagement in activities will improve Outcome: Progressing Goal: Sleeping patterns will improve Outcome: Progressing   Problem: Coping: Goal: Ability to verbalize frustrations and anger appropriately will improve Outcome: Progressing Goal: Ability to demonstrate self-control will improve Outcome: Progressing   Problem: Health Behavior/Discharge Planning: Goal: Identification of resources available to assist in meeting health care needs will improve Outcome: Progressing Goal: Compliance with treatment plan for underlying cause of condition will improve Outcome: Progressing    Problem: Physical Regulation: Goal: Ability to maintain clinical measurements within normal limits will improve Outcome: Progressing   Problem: Safety: Goal: Periods of time without injury will increase Outcome: Progressing

## 2019-10-16 NOTE — Progress Notes (Signed)
Recreation Therapy Notes  Date: 10/16/2019  Time: 9:30 am   Location: Craft room    Behavioral response: N/A   Intervention Topic: Goals   Discussion/Intervention: Patient did not attend group.   Clinical Observations/Feedback:  Patient did not attend group.   Lela Murfin LRT/CTRS         Cloria Ciresi 10/16/2019 12:00 PM

## 2019-10-16 NOTE — Progress Notes (Signed)
   10/16/19 1400  Clinical Encounter Type  Visited With Patient  Visit Type Follow-up;Spiritual support;Social support;Behavioral Health  Referral From Chaplain  Consult/Referral To Chaplain  Visited with Pt as a follow-up from prior visited. Pt said he is still doing good and looking forward to go to a long term rehab. I asked about Pt daughter, he informed me that she is doing good. Ch will follow-up with the Pt.

## 2019-10-16 NOTE — BHH Counselor (Signed)
CSW called to check on status of the patient's referral and noted that weekend notes suggest that the patient is to call to complete a screening.    Per Drema Pry at [facility] the patient needs to call 10/17/2019 at [facility] for a prescreening call.  She hopes that a bed will be available Tuesday 6/22 or Wednesday /6/23.  Penni Homans, MSW, LCSW 10/16/2019 10:37 AM

## 2019-10-16 NOTE — Progress Notes (Signed)
Patient requested to sign a 72-hour form for discharge. Patient, along with Gigi, RN signed the form and it was placed into his chart.

## 2019-10-16 NOTE — BHH Group Notes (Signed)
Overcoming Obstacles  10/16/2019 9:30AM/1PM  Type of Therapy and Topic:  Group Therapy:  Overcoming Obstacles  Participation Level:  Minimal    Description of Group:    In this group patients will be encouraged to explore what they see as obstacles to their own wellness and recovery. They will be guided to discuss their thoughts, feelings, and behaviors related to these obstacles. The group will process together ways to cope with barriers, with attention given to specific choices patients can make. Each patient will be challenged to identify changes they are motivated to make in order to overcome their obstacles. This group will be process-oriented, with patients participating in exploration of their own experiences as well as giving and receiving support and challenge from other group members.   Therapeutic Goals: 1. Patient will identify personal and current obstacles as they relate to admission. 2. Patient will identify barriers that currently interfere with their wellness or overcoming obstacles.  3. Patient will identify feelings, thought process and behaviors related to these barriers. 4. Patient will identify two changes they are willing to make to overcome these obstacles:      Summary of Patient Progress Minimal participation during group session due to being removed from group by nurse tech, did not return. Pt interacted appropriately with group members and respected boundaries.    Therapeutic Modalities:   Cognitive Behavioral Therapy Solution Focused Therapy Motivational Interviewing Relapse Prevention Therapy    Lowella Dandy, MSW, LCSW 10/16/2019 2:16 PM

## 2019-10-16 NOTE — BHH Counselor (Signed)
CSW spoke with Olegario Messier at [facility] [phone].  She requested that the patient call and complete a screening with her over the phone.  CSW passed message to the nurses station so that patient could call before Olegario Messier went on her lunch break.  Penni Homans, MSW, LCSW 10/16/2019 1:25 PM

## 2019-10-17 MED ORDER — HYDROXYZINE HCL 25 MG PO TABS: 25.0000 mg | ORAL_TABLET | Freq: Four times a day (QID) | ORAL | 1 refills | Status: DC | PRN

## 2019-10-17 MED ORDER — HYDROXYZINE HCL 25 MG PO TABS: 25.0000 mg | ORAL_TABLET | Freq: Four times a day (QID) | ORAL | 0 refills | Status: DC | PRN

## 2019-10-17 MED ORDER — CITALOPRAM HYDROBROMIDE 20 MG PO TABS: 20.0000 mg | ORAL_TABLET | Freq: Every day | ORAL | 0 refills | Status: DC

## 2019-10-17 MED ORDER — QUETIAPINE FUMARATE 100 MG PO TABS: 100.0000 mg | ORAL_TABLET | Freq: Every day | ORAL | 0 refills | Status: DC

## 2019-10-17 MED ORDER — FLUTICASONE FUROATE-VILANTEROL 200-25 MCG/INH IN AEPB: 1.0000 | INHALATION_SPRAY | Freq: Every day | RESPIRATORY_TRACT | 0 refills | Status: DC

## 2019-10-17 MED ORDER — DIVALPROEX SODIUM 500 MG PO DR TAB: 500.0000 mg | DELAYED_RELEASE_TABLET | Freq: Two times a day (BID) | ORAL | 1 refills | Status: DC

## 2019-10-17 MED ORDER — DIVALPROEX SODIUM 500 MG PO DR TAB: 500.0000 mg | DELAYED_RELEASE_TABLET | Freq: Two times a day (BID) | ORAL | 0 refills | Status: DC

## 2019-10-17 MED ORDER — FLUTICASONE FUROATE-VILANTEROL 200-25 MCG/INH IN AEPB: 1.0000 | INHALATION_SPRAY | Freq: Every day | RESPIRATORY_TRACT | 1 refills | Status: DC

## 2019-10-17 MED ORDER — ALBUTEROL SULFATE HFA 108 (90 BASE) MCG/ACT IN AERS: 2.0000 | INHALATION_SPRAY | Freq: Four times a day (QID) | RESPIRATORY_TRACT | 1 refills | Status: DC | PRN

## 2019-10-17 MED ORDER — QUETIAPINE FUMARATE 100 MG PO TABS: 100.0000 mg | ORAL_TABLET | Freq: Every day | ORAL | 1 refills | Status: DC

## 2019-10-17 MED ORDER — ALBUTEROL SULFATE HFA 108 (90 BASE) MCG/ACT IN AERS: 2.0000 | INHALATION_SPRAY | Freq: Four times a day (QID) | RESPIRATORY_TRACT | 0 refills | Status: DC | PRN

## 2019-10-17 MED ORDER — CITALOPRAM HYDROBROMIDE 20 MG PO TABS: 20.0000 mg | ORAL_TABLET | Freq: Every day | ORAL | 1 refills | Status: DC

## 2019-10-17 NOTE — Discharge Summary (Signed)
Physician Discharge Summary Note  Patient:  Nathan Koch is an 29 y.o., male MRN:  [id number] DOB:  1990/06/21 Patient phone:  [phone] (home)  Patient address:   [place] [address],  Total Time spent with patient: 30 minutes  Date of Admission:  10/10/2019 Date of Discharge: 10/17/2019  Reason for Admission: Admitted because of depression and passive suicidal ideation  Principal Problem: Depression, major, recurrent, severe with psychosis ([place]) Discharge Diagnoses: Principal Problem:   Depression, major, recurrent, severe with psychosis ([place]) Active Problems:   Asthma   Methamphetamine use ([place])   Past Psychiatric History: Past history of substance abuse and depression  Past Medical History:  Past Medical History:  Diagnosis Date  . Anxiety   . Asthma   . Depression   . Methamphetamine abuse [facility])     Past Surgical History:  Procedure Laterality Date  . CLOSED REDUCTION MANDIBLE N/A 07/02/2018   Procedure: CLOSED REDUCTION MANDIBULAR;  Surgeon: Margaretha Sheffield, MD;  Location: [facility] ORS;  Service: ENT;  Laterality: N/A;   Family History:  Family History  Problem Relation Age of Onset  . Asthma Mother   . Asthma Brother    Family Psychiatric  History: None reported Social History:  Social History   Substance and Sexual Activity  Alcohol Use Not Currently  . Alcohol/week: 7.0 standard drinks  . Types: 7 Standard drinks or equivalent per week     Social History   Substance and Sexual Activity  Drug Use Yes  . Types: Methamphetamines    Social History   Socioeconomic History  . Marital status: Single    Spouse name: Not on file  . Number of children: Not on file  . Years of education: Not on file  . Highest education level: Not on file  Occupational History  . Not on file  Tobacco Use  . Smoking status: Current Every Day Smoker    Packs/day: 1.00    Types: Cigarettes  . Smokeless tobacco: Never Used  . Tobacco comment: started smoking  again December 2019  Substance and Sexual Activity  . Alcohol use: Not Currently    Alcohol/week: 7.0 standard drinks    Types: 7 Standard drinks or equivalent per week  . Drug use: Yes    Types: Methamphetamines  . Sexual activity: Not on file  Other Topics Concern  . Not on file  Social History Narrative  . Not on file   Social Determinants of Health   Financial Resource Strain:   . Difficulty of Paying Living Expenses:   Food Insecurity:   . Worried About Charity fundraiser in the Last Year:   . Arboriculturist in the Last Year:   Transportation Needs:   . Film/video editor (Medical):   Marland Kitchen Lack of Transportation (Non-Medical):   Physical Activity:   . Days of Exercise per Week:   . Minutes of Exercise per Session:   Stress:   . Feeling of Stress :   Social Connections:   . Frequency of Communication with Friends and Family:   . Frequency of Social Gatherings with Friends and Family:   . Attends Religious Services:   . Active Member of Clubs or Organizations:   . Attends Archivist Meetings:   Marland Kitchen Marital Status:     Hospital Course: Patient was kept on 15-minute precautions.  He showed no dangerous behavior in the hospital.  Did not engage in any aggressive violent or threatening behavior.  He  was quite appropriate and calm and euthymic.  Denied any suicidal thought.  Wanted to get back into some kind of residential substance abuse treatment.  Multiple applications made and he has been accepted at [facility].  Physical Findings: AIMS:  , ,  ,  ,    CIWA:    COWS:     Musculoskeletal: Strength & Muscle Tone: within normal limits Gait & Station: normal Patient leans: N/A  Psychiatric Specialty Exam: Physical Exam  Constitutional: He appears well-developed.  HENT:  Head: Normocephalic and atraumatic.  Eyes: Pupils are equal, round, and reactive to light. Conjunctivae are normal.  Cardiovascular: Normal heart sounds.  Respiratory: Effort normal.  GI:  Soft.  Musculoskeletal:        General: Normal range of motion.     Cervical back: Normal range of motion.  Neurological: He is alert.  Skin: Skin is warm and dry.  Psychiatric: Mood normal.    Review of Systems  Constitutional: Negative.   HENT: Negative.   Eyes: Negative.   Respiratory: Negative.   Cardiovascular: Negative.   Gastrointestinal: Negative.   Musculoskeletal: Negative.   Skin: Negative.   Neurological: Negative.   Psychiatric/Behavioral: Negative.     Blood pressure 109/67, pulse 71, temperature 97.8 F (36.6 C), temperature source Oral, resp. rate 18, height 5\' 8"  (1.727 m), weight 87.1 kg, SpO2 99 %.Body mass index is 29.19 kg/m.  General Appearance: Casual  Eye Contact:  Good  Speech:  Clear and Coherent  Volume:  Normal  Mood:  Euthymic  Affect:  Appropriate  Thought Process:  Coherent  Orientation:  Full (Time, Place, and Person)  Thought Content:  Logical  Suicidal Thoughts:  No  Homicidal Thoughts:  No  Memory:  Immediate;   Fair Recent;   Fair Remote;   Fair  Judgement:  Fair  Insight:  Fair  Psychomotor Activity:  Normal  Concentration:  Concentration: Fair  Recall:  Fair  Fund of Knowledge:  Fair  Language:  Fair  Akathisia:  No  Handed:  Right  AIMS (if indicated):     Assets:  Desire for Improvement  ADL's:  Intact  Cognition:  WNL  Sleep:  Number of Hours: 8     Have you used any form of tobacco in the last 30 days? (Cigarettes, Smokeless Tobacco, Cigars, and/or Pipes): Yes  Has this patient used any form of tobacco in the last 30 days? (Cigarettes, Smokeless Tobacco, Cigars, and/or Pipes) Yes, No  Blood Alcohol level:  Lab Results  Component Value Date   ETH <10 10/09/2019   ETH <10 09/13/2019    Metabolic Disorder Labs:  No results found for: HGBA1C, MPG No results found for: PROLACTIN Lab Results  Component Value Date   CHOL 156 07/11/2014   TRIG 178 (A) 07/11/2014   HDL 46 07/11/2014   LDLCALC 74 07/11/2014     See Psychiatric Specialty Exam and Suicide Risk Assessment completed by Attending Physician prior to discharge.  Discharge destination:  Home  Is patient on multiple antipsychotic therapies at discharge:  No   Has Patient had three or more failed trials of antipsychotic monotherapy by history:  No  Recommended Plan for Multiple Antipsychotic Therapies: NA  Discharge Instructions    Diet - low sodium heart healthy   Complete by: As directed    Increase activity slowly   Complete by: As directed      Allergies as of 10/17/2019      Reactions   Ibuprofen Swelling  Medication List    STOP taking these medications   Fluticasone-Salmeterol 250-50 MCG/DOSE Aepb Commonly known as: ADVAIR     TAKE these medications     Indication  albuterol 108 (90 Base) MCG/ACT inhaler Commonly known as: VENTOLIN HFA Inhale 2 puffs into the lungs every 6 (six) hours as needed for wheezing or shortness of breath. What changed: when to take this  Indication: Asthma, Chronic Obstructive Lung Disease   citalopram 20 MG tablet Commonly known as: CELEXA Take 1 tablet (20 mg total) by mouth daily. Start taking on: October 18, 2019 What changed: when to take this  Indication: Depression   divalproex 500 MG DR tablet Commonly known as: DEPAKOTE Take 1 tablet (500 mg total) by mouth every 12 (twelve) hours. What changed: when to take this  Indication: Depressive Phase of Manic-Depression   fluticasone furoate-vilanterol 200-25 MCG/INH Aepb Commonly known as: BREO ELLIPTA Inhale 1 puff into the lungs daily. Start taking on: October 18, 2019  Indication: Asthma   hydrOXYzine 25 MG tablet Commonly known as: ATARAX/VISTARIL Take 1 tablet (25 mg total) by mouth every 6 (six) hours as needed for anxiety.  Indication: Feeling Anxious   QUEtiapine 100 MG tablet Commonly known as: SEROQUEL Take 1 tablet (100 mg total) by mouth at bedtime.  Indication: Major Depressive Disorder        Follow-up Information    [facility] Follow up.   Why: Your intake is scheduled for 11AM on 10/17/2019.  Thanks! Contact information: [address]  [place], [place] [address] Phone: [phone] Fax: [phone]              Follow-up recommendations:  Activity:  Activity as tolerated Diet:  Regular diet Other:  Follow-up with [facility] substance abuse treatment continue current medication  Comments: Prescriptions provided at discharge  Signed: Mordecai Rasmussen, MD 10/17/2019, 10:27 AM

## 2019-10-17 NOTE — Progress Notes (Signed)
Recreation Therapy Notes  INPATIENT RECREATION TR PLAN  Patient Details Name: Nathan Koch MRN: [id number] DOB: [birth year] Today's Date: 10/17/2019  Rec Therapy Plan Is patient appropriate for Therapeutic Recreation?: Yes Treatment times per week: at least 3 Estimated Length of Stay: 5-7 days TR Treatment/Interventions: Group participation (Comment)  Discharge Criteria Pt will be discharged from therapy if:: Discharged Treatment plan/goals/alternatives discussed and agreed upon by:: Patient/family  Discharge Summary Short term goals set: Patient will identify 3 positive coping skills strategies to use post d/c within 5 recreation therapy group sessions Short term goals met: Adequate for discharge Progress toward goals comments: Groups attended Which groups?: AAA/T, Other (Comment) (Strengths, Decision making) Reason goals not met: N/A Therapeutic equipment acquired: N/A Reason patient discharged from therapy: Discharge from hospital Pt/family agrees with progress & goals achieved: Yes Date patient discharged from therapy: 10/17/19   Nathan Koch 10/17/2019, 4:12 PM

## 2019-10-17 NOTE — Plan of Care (Signed)
  Problem: Coping Skills Goal: STG - Patient will identify 3 positive coping skills strategies to use post d/c within 5 recreation therapy group sessions Description: STG - Patient will identify 3 positive coping skills strategies to use post d/c within 5 recreation therapy group sessions 10/17/2019 1610 by Alveria Apley, LRT Outcome: Adequate for Discharge 10/17/2019 1610 by Alveria Apley, LRT Outcome: Adequate for Discharge

## 2019-10-17 NOTE — BHH Counselor (Signed)
CSW spoke with Rayfield Citizen at [facility] who reports that patient needs to arrive by 1:15pm.    CSW explained that the patient would not likely arrive by that time due to transportation issues.    CSW then spoke with Shon Hale who stated "we'll see what time he arrives".  CSW clarified that the patient would be accepted.   Penni Homans, MSW, LCSW 10/17/2019 1:06 PM

## 2019-10-17 NOTE — Progress Notes (Signed)
Pt received medications, prescriptions, dc packet and belongings. Pt was educated on dc plan and verbalizes undertstanding. Pt denies SI, HI and AVH. Torrie Mayers RN

## 2019-10-17 NOTE — Plan of Care (Signed)
  Problem: Education: Goal: Knowledge of [place] [facility] Education information/materials will improve Outcome: Progressing Goal: Emotional status will improve Outcome: Progressing Goal: Mental status will improve Outcome: Progressing Goal: Verbalization of understanding the information provided will improve Outcome: Progressing   Problem: Activity: Goal: Interest or engagement in activities will improve Outcome: Progressing Goal: Sleeping patterns will improve Outcome: Progressing   

## 2019-10-17 NOTE — BHH Suicide Risk Assessment (Signed)
[facility] Discharge Suicide Risk Assessment   Principal Problem: Depression, major, recurrent, severe with psychosis ([name]) Discharge Diagnoses: Principal Problem:   Depression, major, recurrent, severe with psychosis ([name]) Active Problems:   Asthma   Methamphetamine use ([name])   Total Time spent with patient: 30 minutes  Musculoskeletal: Strength & Muscle Tone: within normal limits Gait & Station: normal Patient leans: N/A  Psychiatric Specialty Exam: Review of Systems  Constitutional: Negative.   HENT: Negative.   Eyes: Negative.   Respiratory: Negative.   Cardiovascular: Negative.   Gastrointestinal: Negative.   Musculoskeletal: Negative.   Skin: Negative.   Neurological: Negative.   Psychiatric/Behavioral: Negative.     Blood pressure 109/67, pulse 71, temperature 97.8 F (36.6 C), temperature source Oral, resp. rate 18, height 5\' 8"  (1.727 m), weight 87.1 kg, SpO2 99 %.Body mass index is 29.19 kg/m.  General Appearance: Casual  Eye Contact::  Fair  Speech:  Clear and Coherent409  Volume:  Normal  Mood:  Euthymic  Affect:  Congruent  Thought Process:  Goal Directed  Orientation:  Full (Time, Place, and Person)  Thought Content:  Logical  Suicidal Thoughts:  No  Homicidal Thoughts:  No  Memory:  Immediate;   Fair Recent;   Fair Remote;   Fair  Judgement:  Fair  Insight:  Fair  Psychomotor Activity:  Normal  Concentration:  Fair  Recall:  [phone] of Knowledge:Fair  Language: Fair  Akathisia:  No  Handed:  Right  AIMS (if indicated):     Assets:  Desire for Improvement  Sleep:  Number of Hours: 8  Cognition: WNL  ADL's:  Intact   Mental Status Per Nursing Assessment::   On Admission:  NA  Demographic Factors:  Unemployed  Loss Factors: Financial problems/change in socioeconomic status  Historical Factors: Impulsivity  Risk Reduction Factors:   Positive therapeutic relationship  Continued Clinical Symptoms:  Depression:   Comorbid alcohol  abuse/dependence Alcohol/Substance Abuse/Dependencies  Cognitive Features That Contribute To Risk:  None    Suicide Risk:  Minimal: No identifiable suicidal ideation.  Patients presenting with no risk factors but with morbid ruminations; may be classified as minimal risk based on the severity of the depressive symptoms   Follow-up Information    [facility] Follow up.   Why: Your intake is scheduled for 11AM on 10/17/2019.  Thanks! Contact information: [address],  [place], [place] Phone: [phone] Fax: [phone]              Plan Of Care/Follow-up recommendations:  Activity:  Activity as tolerated Diet:  Regular diet Other:  Follow-up with outpatient treatment at [facility]  [phone], MD 10/17/2019, 10:22 AM

## 2019-10-17 NOTE — Progress Notes (Signed)
Recreation Therapy Notes  Date: 10/17/2019  Time: 9:30 am  Location: Craft room   Behavioral response: Appropriate  Intervention Topic: Strengths   Discussion/Intervention:  Group content today was focused on strengths. The group identified some of the strengths they have. Individuals stated reason why they do not use their strengths. Patients expressed what strengths others see in them. The group identified important reason to use their strengths. The group participated in the intervention "Picking strengths", where they had a chance to identify some of their strengths. Clinical Observations/Feedback:  Patient came to group and explained that strengths from life experiences. He identified his strengths as caring, funny, helpful, polite and conversation. Participant expressed that he wishes his temper was better and that could help him improve his strengths. Individual was social with peers and staff while participating on the intervention.  Nathan Koch LRT/CTRS         Nathan Koch 10/17/2019 12:41 PM

## 2019-10-17 NOTE — BHH Group Notes (Signed)
LCSW Group Therapy Note  10/17/2019 2:57 PM  Type of Therapy/Topic:  Group Therapy:  Feelings about Diagnosis  Participation Level:  Did Not Attend   Description of Group:   This group will allow patients to explore their thoughts and feelings about diagnoses they have received. Patients will be guided to explore their level of understanding and acceptance of these diagnoses. Facilitator will encourage patients to process their thoughts and feelings about the reactions of others to their diagnosis and will guide patients in identifying ways to discuss their diagnosis with significant others in their lives. This group will be process-oriented, with patients participating in exploration of their own experiences, giving and receiving support, and processing challenge from other group members.   Therapeutic Goals: 1. Patient will demonstrate understanding of diagnosis as evidenced by identifying two or more symptoms of the disorder 2. Patient will be able to express two feelings regarding the diagnosis 3. Patient will demonstrate their ability to communicate their needs through discussion and/or role play  Summary of Patient Progress: X  Therapeutic Modalities:   Cognitive Behavioral Therapy Brief Therapy Feelings Identification   Penni Homans, MSW, LCSW 10/17/2019 2:57 PM

## 2019-10-17 NOTE — Plan of Care (Signed)
Pt rates depression, hopelessness and anxiety all at 7/10. Pt denies SI, HI and AVH. Pt was educated on care plan and verbalizes understanding. Pt encouraged to attend groups. Torrie Mayers RN Problem: Education: Goal: Knowledge of [place] [facility] Education information/materials will improve Outcome: Adequate for Discharge Goal: Emotional status will improve Outcome: Adequate for Discharge Goal: Mental status will improve Outcome: Adequate for Discharge Goal: Verbalization of understanding the information provided will improve Outcome: Adequate for Discharge   Problem: Activity: Goal: Interest or engagement in activities will improve Outcome: Adequate for Discharge Goal: Sleeping patterns will improve Outcome: Adequate for Discharge   Problem: Coping: Goal: Ability to verbalize frustrations and anger appropriately will improve Outcome: Adequate for Discharge Goal: Ability to demonstrate self-control will improve Outcome: Adequate for Discharge   Problem: Health Behavior/Discharge Planning: Goal: Identification of resources available to assist in meeting health care needs will improve Outcome: Adequate for Discharge Goal: Compliance with treatment plan for underlying cause of condition will improve Outcome: Adequate for Discharge   Problem: Physical Regulation: Goal: Ability to maintain clinical measurements within normal limits will improve Outcome: Adequate for Discharge   Problem: Safety: Goal: Periods of time without injury will increase Outcome: Adequate for Discharge

## 2019-10-17 NOTE — Progress Notes (Signed)
  [facility] Adult Case Management Discharge Plan :  Will you be returning to the same living situation after discharge:  No. Pt will be going to [facility]. At discharge, do you have transportation home?: Yes,  CSW will assist with transportation. Do you have the ability to pay for your medications: No.  Patient does not have insurance.   Release of information consent forms completed and in the chart;  Patient's signature needed at discharge.  Patient to Follow up at:  Follow-up Information    [facility] Follow up.   Why: Your intake is scheduled for 11AM on 10/17/2019.  Thanks! Contact information: [address]  [place], [place] [address] Phone: [phone] Fax: [phone]              Next level of care provider has access to [facility] Link:no  Safety Planning and Suicide Prevention discussed: Yes,  SPE with patient.   Have you used any form of tobacco in the last 30 days? (Cigarettes, Smokeless Tobacco, Cigars, and/or Pipes): Yes  Has patient been referred to the Quitline?: Patient refused referral  Patient has been referred for addiction treatment: Pt. refused referral  Harden Mo, LCSW 10/17/2019, 12:01 PM

## 2019-10-17 NOTE — Progress Notes (Signed)
Discussed his getting upset with a tech on day shift. Educated patient that learning to cope with personality conflicts in the hospital is good practice for developing coping skills to deal with people he does not get along with once he is discharged. Patient was heard saying he wanted to "take back" his 72 hour request for discharge--that he had signed it in anger after becoming upset with staff at being redirected for being too loud in the dayroom. Denies SI, HI and AVH.

## 2019-11-13 ENCOUNTER — Emergency Department: Payer: Self-pay

## 2019-11-13 ENCOUNTER — Emergency Department
Admission: EM | Admit: 2019-11-13 | Discharge: 2019-11-13 | Disposition: A | Discharge status: Home / Self Care | Payer: Self-pay | Attending: Emergency Medicine | Admitting: Emergency Medicine

## 2019-11-13 ENCOUNTER — Other Ambulatory Visit: Payer: Self-pay

## 2019-11-13 DIAGNOSIS — R079 Chest pain, unspecified: Secondary | ICD-10-CM | POA: Insufficient documentation

## 2019-11-13 DIAGNOSIS — Z79899 Other long term (current) drug therapy: Secondary | ICD-10-CM | POA: Insufficient documentation

## 2019-11-13 DIAGNOSIS — J45909 Unspecified asthma, uncomplicated: Secondary | ICD-10-CM | POA: Insufficient documentation

## 2019-11-13 DIAGNOSIS — F17210 Nicotine dependence, cigarettes, uncomplicated: Secondary | ICD-10-CM | POA: Insufficient documentation

## 2019-11-13 LAB — BASIC METABOLIC PANEL
Anion gap: 8 (ref 5–15)
BUN: 14 mg/dL (ref 6–20)
CO2: 26 mmol/L (ref 22–32)
Calcium: 9 mg/dL (ref 8.9–10.3)
Chloride: 104 mmol/L (ref 98–111)
Creatinine, Ser: 1.07 mg/dL (ref 0.61–1.24)
GFR calc Af Amer: >60 mL/min (ref >60)
GFR calc non Af Amer: >60 mL/min (ref >60)
Glucose, Bld: 92 mg/dL (ref 70–99)
Potassium: 4.5 mmol/L (ref 3.5–5.1)
Sodium: 138 mmol/L (ref 135–145)

## 2019-11-13 LAB — CBC
HCT: 41.2 % (ref 39.0–52.0)
Hemoglobin: 14.1 g/dL (ref 13.0–17.0)
MCH: 28.8 pg (ref 26.0–34.0)
MCHC: 34.2 g/dL (ref 30.0–36.0)
MCV: 84.3 fL (ref 80.0–100.0)
Platelets: 261 10*3/uL (ref 150–400)
RBC: 4.89 MIL/uL (ref 4.22–5.81)
RDW: 13.7 % (ref 11.5–15.5)
WBC: 8.7 10*3/uL (ref 4.0–10.5)
nRBC: 0 % (ref 0.0–0.2)

## 2019-11-13 LAB — TROPONIN I (HIGH SENSITIVITY): Troponin I (High Sensitivity): <2 ng/L (ref <18)

## 2019-11-13 MED ORDER — DEXAMETHASONE 4 MG PO TABS
10.0000 mg | ORAL_TABLET | Freq: Once | ORAL | Status: AC
  Administered 2019-11-13: 10 mg via ORAL
  Filled 2019-11-13: qty 2.5

## 2019-11-13 MED ORDER — SODIUM CHLORIDE 0.9% FLUSH: 3.0000 mL | Freq: Once | INTRAVENOUS | Status: DC

## 2019-11-13 NOTE — ED Notes (Signed)
[place] signature pad not connecting to computer. Pt verbalizes understanding of all information shared at discharge.

## 2019-11-13 NOTE — ED Provider Notes (Signed)
[facility] Emergency Department Provider Note  Time seen: 4:43 PM  I have reviewed the triage vital signs and the nursing notes.   HISTORY  Chief Complaint Chest Pain   HPI Nathan Koch is a 29 y.o. male with a past medical history anxiety, asthma, presents to the emergency department for shortness of breath and some chest discomfort.  According to the patient for the past 3 to 4 days he has been experiencing shortness of breath especially with exertion.  Patient thought it was his asthma but has been using his inhaler as prescribed and is not helping.  States he is started to have some chest discomfort at times in the mid chest.  Denies any significant chest pain currently.  No shortness of breath at this time.  No recent fever or significant cough.   Past Medical History:  Diagnosis Date  . Anxiety   . Asthma   . Depression   . Methamphetamine abuse [facility])     Patient Active Problem List   Diagnosis Date Noted  . 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 MDD (major depressive disorder), recurrent, severe, with psychosis ([name]) 09/14/2019  . MDD (major depressive disorder), single episode, severe with psychosis ([name]) 09/14/2019  . Asthma 06/26/2014    Past Surgical History:  Procedure Laterality Date  . CLOSED REDUCTION MANDIBLE N/A 07/02/2018   Procedure: CLOSED REDUCTION MANDIBULAR;  Surgeon: Vernie Murders, MD;  Location: [facility] ORS;  Service: ENT;  Laterality: N/A;    Prior to Admission medications   Medication Sig Start Date End Date Taking? Authorizing Provider  albuterol (VENTOLIN HFA) 108 (90 Base) MCG/ACT inhaler Inhale 2 puffs into the lungs every 6 (six) hours as needed for wheezing or shortness of breath. 10/17/19   Clapacs, Jackquline Denmark, MD  citalopram (CELEXA) 20 MG tablet Take 1 tablet (20 mg total) by mouth daily. 10/18/19   Clapacs, Jackquline Denmark, MD  divalproex (DEPAKOTE) 500 MG DR  tablet Take 1 tablet (500 mg total) by mouth every 12 (twelve) hours. 10/17/19   Clapacs, Jackquline Denmark, MD  fluticasone furoate-vilanterol (BREO ELLIPTA) 200-25 MCG/INH AEPB Inhale 1 puff into the lungs daily. 10/18/19   Clapacs, Jackquline Denmark, MD  hydrOXYzine (ATARAX/VISTARIL) 25 MG tablet Take 1 tablet (25 mg total) by mouth every 6 (six) hours as needed for anxiety. 10/17/19   Clapacs, Jackquline Denmark, MD  QUEtiapine (SEROQUEL) 100 MG tablet Take 1 tablet (100 mg total) by mouth at bedtime. 10/17/19   Clapacs, Jackquline Denmark, MD    Allergies  Allergen Reactions  . Ibuprofen Swelling    Family History  Problem Relation Age of Onset  . Asthma Mother   . Asthma Brother     Social History Social History   Tobacco Use  . Smoking status: Current Every Day Smoker    Packs/day: 1.00    Types: Cigarettes  . Smokeless tobacco: Never Used  . Tobacco comment: started smoking again December 2019  Substance Use Topics  . Alcohol use: Not Currently    Alcohol/week: 7.0 standard drinks    Types: 7 Standard drinks or equivalent per week  . Drug use: Yes    Types: Methamphetamines    Comment: non for 60 days    Review of Systems Constitutional: Negative for fever. Cardiovascular: Intermittent central chest pain. Respiratory: Shortness of breath somewhat worse with exertion. Gastrointestinal: Negative for abdominal pain, vomiting  Musculoskeletal: Negative for musculoskeletal complaints Neurological: Negative for headache  All other ROS negative  ____________________________________________   PHYSICAL EXAM:  VITAL SIGNS: ED Triage Vitals  Enc Vitals Group     BP 11/13/19 1407 115/79     Pulse Rate 11/13/19 1407 82     Resp 11/13/19 1407 18     Temp 11/13/19 1407 98.9 F (37.2 C)     Temp src --      SpO2 11/13/19 1407 97 %     Weight 11/13/19 1404 220 lb (99.8 kg)     Height 11/13/19 1404 5\' 8"  (1.727 m)     Head Circumference --      Peak Flow --      Pain Score 11/13/19 1404 6     Pain Loc --       Pain Edu? --      Excl. in GC? --     Constitutional: Alert and oriented. Well appearing and in no distress. Eyes: Normal exam ENT      Head: Normocephalic and atraumatic.      Mouth/Throat: Mucous membranes are moist. Cardiovascular: Normal rate, regular rhythm.  Respiratory: Normal respiratory effort without tachypnea nor retractions. Breath sounds are clear without wheeze rales or rhonchi. Gastrointestinal: Soft and nontender. No distention.   Musculoskeletal: Nontender with normal range of motion in all extremities.  Neurologic:  Normal speech and language. No gross focal neurologic deficits Skin:  Skin is warm, dry and intact.  Psychiatric: Mood and affect are normal.   ____________________________________________    EKG  EKG viewed and interpreted by myself shows a normal sinus rhythm at 72 bpm with a narrow QRS, normal axis, normal intervals, no concerning ST changes.  ____________________________________________    RADIOLOGY  Chest x-ray is negative.  ____________________________________________   INITIAL IMPRESSION / ASSESSMENT AND PLAN / ED COURSE  Pertinent labs & imaging results that were available during my care of the patient were reviewed by me and considered in my medical decision making (see chart for details).   Patient presents emergency department for shortness of breath and chest pain.  Reassuringly patient's work-up is normal including a negative troponin.  Overall clear chest x-ray and reassuring EKG.  Patient is asthmatic, believes her symptoms could be similar to prior asthma exacerbations.  Patient states he cannot afford steroids.  We will dose a one-time dose of Decadron in the emergency department.  Patient will continue to use his albuterol as needed.  I discussed my typical chest pain return precautions.  JIYAAN STEINHAUSER was evaluated in Emergency Department on 11/13/2019 for the symptoms described in the history of present illness. He was  evaluated in the context of the global COVID-19 pandemic, which necessitated consideration that the patient might be at risk for infection with the SARS-CoV-2 virus that causes COVID-19. Institutional protocols and algorithms that pertain to the evaluation of patients at risk for COVID-19 are in a state of rapid change based on information released by regulatory bodies including the CDC and federal and state organizations. These policies and algorithms were followed during the patient's care in the ED.  ____________________________________________   FINAL CLINICAL IMPRESSION(S) / ED DIAGNOSES  Chest pain Asthma   11/15/2019, MD 11/13/19 1645

## 2019-11-13 NOTE — ED Triage Notes (Signed)
Pt comes via POV from home with c/o CP for 3 days. Pt states some SOB. Pt has hx of asthma and is a smoker.  Pt states mid sternal 6/10 pain.

## 2019-11-21 ENCOUNTER — Other Ambulatory Visit: Payer: Self-pay

## 2019-11-21 ENCOUNTER — Emergency Department
Admission: EM | Admit: 2019-11-21 | Discharge: 2019-11-25 | Disposition: A | Discharge status: Home / Self Care | Payer: Self-pay | Attending: Emergency Medicine | Admitting: Emergency Medicine

## 2019-11-21 DIAGNOSIS — F1511 Other stimulant abuse, in remission: Secondary | ICD-10-CM

## 2019-11-21 DIAGNOSIS — Z7951 Long term (current) use of inhaled steroids: Secondary | ICD-10-CM | POA: Insufficient documentation

## 2019-11-21 DIAGNOSIS — F323 Major depressive disorder, single episode, severe with psychotic features: Secondary | ICD-10-CM | POA: Diagnosis present

## 2019-11-21 DIAGNOSIS — Y907 Blood alcohol level of 200-239 mg/100 ml: Secondary | ICD-10-CM | POA: Insufficient documentation

## 2019-11-21 DIAGNOSIS — Z20822 Contact with and (suspected) exposure to covid-19: Secondary | ICD-10-CM | POA: Insufficient documentation

## 2019-11-21 DIAGNOSIS — R45851 Suicidal ideations: Secondary | ICD-10-CM | POA: Insufficient documentation

## 2019-11-21 DIAGNOSIS — F1520 Other stimulant dependence, uncomplicated: Secondary | ICD-10-CM | POA: Diagnosis present

## 2019-11-21 DIAGNOSIS — F333 Major depressive disorder, recurrent, severe with psychotic symptoms: Secondary | ICD-10-CM | POA: Diagnosis present

## 2019-11-21 DIAGNOSIS — F17210 Nicotine dependence, cigarettes, uncomplicated: Secondary | ICD-10-CM | POA: Insufficient documentation

## 2019-11-21 DIAGNOSIS — J45909 Unspecified asthma, uncomplicated: Secondary | ICD-10-CM | POA: Insufficient documentation

## 2019-11-21 LAB — URINE DRUG SCREEN, QUALITATIVE (ARMC ONLY)
Amphetamines, Ur Screen: NOT DETECTED
Barbiturates, Ur Screen: NOT DETECTED
Benzodiazepine, Ur Scrn: NOT DETECTED
Cannabinoid 50 Ng, Ur ~~LOC~~: NOT DETECTED
Cocaine Metabolite,Ur ~~LOC~~: NOT DETECTED
MDMA (Ecstasy)Ur Screen: NOT DETECTED
Methadone Scn, Ur: NOT DETECTED
Opiate, Ur Screen: NOT DETECTED
Phencyclidine (PCP) Ur S: NOT DETECTED
Tricyclic, Ur Screen: NOT DETECTED

## 2019-11-21 LAB — CBC
HCT: 41.7 % (ref 39.0–52.0)
Hemoglobin: 14.3 g/dL (ref 13.0–17.0)
MCH: 28.8 pg (ref 26.0–34.0)
MCHC: 34.3 g/dL (ref 30.0–36.0)
MCV: 83.9 fL (ref 80.0–100.0)
Platelets: 293 10*3/uL (ref 150–400)
RBC: 4.97 MIL/uL (ref 4.22–5.81)
RDW: 13.4 % (ref 11.5–15.5)
WBC: 11.4 10*3/uL — ABNORMAL HIGH (ref 4.0–10.5)
nRBC: 0 % (ref 0.0–0.2)

## 2019-11-21 LAB — COMPREHENSIVE METABOLIC PANEL
ALT: 15 U/L (ref 0–44)
AST: 23 U/L (ref 15–41)
Albumin: 4.3 g/dL (ref 3.5–5.0)
Alkaline Phosphatase: 51 U/L (ref 38–126)
Anion gap: 12 (ref 5–15)
BUN: 15 mg/dL (ref 6–20)
CO2: 22 mmol/L (ref 22–32)
Calcium: 9.2 mg/dL (ref 8.9–10.3)
Chloride: 103 mmol/L (ref 98–111)
Creatinine, Ser: 1.01 mg/dL (ref 0.61–1.24)
GFR calc Af Amer: >60 mL/min (ref >60)
GFR calc non Af Amer: >60 mL/min (ref >60)
Glucose, Bld: 118 mg/dL — ABNORMAL HIGH (ref 70–99)
Potassium: 3.8 mmol/L (ref 3.5–5.1)
Sodium: 137 mmol/L (ref 135–145)
Total Bilirubin: 1.2 mg/dL (ref 0.3–1.2)
Total Protein: 7.9 g/dL (ref 6.5–8.1)

## 2019-11-21 LAB — ACETAMINOPHEN LEVEL: Acetaminophen (Tylenol), Serum: <10 ug/mL — ABNORMAL LOW (ref 10–30)

## 2019-11-21 LAB — ETHANOL: Alcohol, Ethyl (B): <10 mg/dL (ref <10)

## 2019-11-21 LAB — SALICYLATE LEVEL: Salicylate Lvl: <7 mg/dL — ABNORMAL LOW (ref 7.0–30.0)

## 2019-11-21 MED ORDER — HYDROXYZINE HCL 25 MG PO TABS
25.0000 mg | ORAL_TABLET | Freq: Four times a day (QID) | ORAL | Status: DC | PRN
  Administered 2019-11-22 – 2019-11-24 (×5): 25 mg via ORAL
  Filled 2019-11-21 (×5): qty 1

## 2019-11-21 MED ORDER — DIVALPROEX SODIUM 500 MG PO DR TAB
500.0000 mg | DELAYED_RELEASE_TABLET | Freq: Two times a day (BID) | ORAL | Status: DC
  Administered 2019-11-21 – 2019-11-25 (×8): 500 mg via ORAL
  Filled 2019-11-21 (×8): qty 1

## 2019-11-21 MED ORDER — CITALOPRAM HYDROBROMIDE 20 MG PO TABS
20.0000 mg | ORAL_TABLET | Freq: Every day | ORAL | Status: DC
  Administered 2019-11-22 – 2019-11-25 (×4): 20 mg via ORAL
  Filled 2019-11-21 (×4): qty 1

## 2019-11-21 MED ORDER — QUETIAPINE FUMARATE 25 MG PO TABS
100.0000 mg | ORAL_TABLET | Freq: Every day | ORAL | Status: DC
  Administered 2019-11-21 – 2019-11-24 (×4): 100 mg via ORAL
  Filled 2019-11-21 (×4): qty 4

## 2019-11-21 NOTE — ED Notes (Signed)
Pt. Alert and oriented, warm and dry, in no distress. Pt. Denies HI, and AVH. Pt states having Si thoughts to jump into oncoming traffic. Patient contracts for safety with this Clinical research associate. Pt. Encouraged to let nursing staff know of any concerns or needs.

## 2019-11-21 NOTE — Consult Note (Signed)
[facility] Face-to-Face Psychiatry Consult   Reason for Consult: Mental Health Problem Referring Physician: Dr. Manson Passey Patient Identification: Nathan Koch MRN:  [id number] Principal Diagnosis: <principal problem not specified> Diagnosis:  Active Problems:   Methamphetamine use ([name])   MDD (major depressive disorder), recurrent, severe, with psychosis ([name])   MDD (major depressive disorder), single episode, severe with psychosis ([name])   Depression, major, recurrent, severe with psychosis ([name])   Total Time spent with patient: 30 minutes  Subjective: " I have been in rehab for 2 months I went to court and they will not accept me back." Nathan Koch is a 29 y.o. male patient presented to [facility] ED via POV voluntary. Per the ED triage nurse note, the patient arrived via POV voluntarily with reports of SI x 3 hours; the patient has a plan to jump into traffic. The patient denies any AVH. The patient has hx of anxiety and depression, pt currently on Celexa, Vistaril, Ativan, the patient states he missed his dose last night, pt was in jail x 1 and got out at 3 pm. The patient states he hitch-hiked. The patient states he is currently homeless.   The patient is someone who this writer has seen on several occasions, and on this visit, he presents well and the most appropriate that I have ever seen him.  The patient states he has been at the [facility] rehab facility for the past two months.  He said he had to attend court and became confused about his court date.  He states, "once I realized I had missed my court date.  I went and turned myself in because I knew it would be considered a failure to appear."  The patient voice that he discussed all of the above with [facility] before leaving the facility.  He voiced when he went to court; he had stayed two nights until he could bailout.  He voice, when he returned to [facility], they  refused to accept him back.  The patient voiced he became suicidal because "all of the hard work  that I put in getting clean would be for nothing."  The patient discussed he felt like he wanted to relapse and started using meth again.  He stated, "I became suicidal, and I called my wife, and she told me to come to the emergency room."  The patient voiced he did not relapse and came directly to the hospital." The patient was seen face-to-face by this provider; the chart was reviewed and consulted with Dr. Katrinka Blazing on 11/22/2019 due to the patient's care. It was discussed with the EDP that the patient would remain under observation overnight and will be reassessed in the a.m. to determine if he meets the criteria for psychiatric inpatient admission; he could be discharged back home. The patient is alert and oriented x 4, calm, cooperative, and mood-congruent with effect on evaluation. The patient's UDS is negative for all substances. The patient is also homeless.  The patient does not appear to be responding to internal and external stimuli.  The patient is not presenting with any delusional thinking. The patient denies auditory and visual hallucinations. The patient admits to suicidal ideation with a plan to jump into traffic and get hit by a car. He voiced that he has worked extremely hard to maintain his sobriety. The patient is not presenting with any psychotic or paranoid behaviors. During an encounter with the patient, he was able to answer questions appropriately.  Plan: The patient remained under observation overnight  and will be reassessed in the a.m. to determine if he meets the criteria for psychiatric inpatient admission; he could be discharged back home.  HPI: Per Dr. Manson Passey: Nathan Koch is a 29 y.o. male below list of previous medical conditions including depression and methamphetamine abuse presents to the emergency department suicidal ideation with plan and request for detox from methamphetamines.  Patient states last meth use was 24 hours ago.  Patient states that he was recently in detox  a month ago.    Past Psychiatric History:  Anxiety Depression Methamphetamine abuse ([name])  Risk to Self:   No Risk to Others:   No Prior Inpatient Therapy:   Yes Prior Outpatient Therapy:   Yes  Past Medical History:  Past Medical History:  Diagnosis Date  . Anxiety   . Asthma   . Depression   . Methamphetamine abuse [facility])     Past Surgical History:  Procedure Laterality Date  . CLOSED REDUCTION MANDIBLE N/A 07/02/2018   Procedure: CLOSED REDUCTION MANDIBULAR;  Surgeon: Vernie Murders, MD;  Location: [facility] ORS;  Service: ENT;  Laterality: N/A;   Family History:  Family History  Problem Relation Age of Onset  . Asthma Mother   . Asthma Brother    Family Psychiatric  History: Unknown Social History:  Social History   Substance and Sexual Activity  Alcohol Use Not Currently  . Alcohol/week: 7.0 standard drinks  . Types: 7 Standard drinks or equivalent per week     Social History   Substance and Sexual Activity  Drug Use Yes  . Types: Methamphetamines   Comment: non for 60 days    Social History   Socioeconomic History  . Marital status: Single    Spouse name: Not on file  . Number of children: Not on file  . Years of education: Not on file  . Highest education level: Not on file  Occupational History  . Not on file  Tobacco Use  . Smoking status: Current Every Day Smoker    Packs/day: 1.00    Types: Cigarettes  . Smokeless tobacco: Never Used  . Tobacco comment: started smoking again December 2019  Substance and Sexual Activity  . Alcohol use: Not Currently    Alcohol/week: 7.0 standard drinks    Types: 7 Standard drinks or equivalent per week  . Drug use: Yes    Types: Methamphetamines    Comment: non for 60 days  . Sexual activity: Not on file  Other Topics Concern  . Not on file  Social History Narrative  . Not on file   Social Determinants of Health   Financial Resource Strain:   . Difficulty of Paying Living Expenses:   Food Insecurity:    . Worried About Programme researcher, broadcasting/film/video in the Last Year:   . Barista in the Last Year:   Transportation Needs:   . Freight forwarder (Medical):   Marland Kitchen Lack of Transportation (Non-Medical):   Physical Activity:   . Days of Exercise per Week:   . Minutes of Exercise per Session:   Stress:   . Feeling of Stress :   Social Connections:   . Frequency of Communication with Friends and Family:   . Frequency of Social Gatherings with Friends and Family:   . Attends Religious Services:   . Active Member of Clubs or Organizations:   . Attends Banker Meetings:   Marland Kitchen Marital Status:    Additional Social History:    Allergies:  Allergies  Allergen Reactions  . Ibuprofen Swelling    Labs:  Results for orders placed or performed during the hospital encounter of 11/21/19 (from the past 48 hour(s))  Comprehensive metabolic panel     Status: Abnormal   Collection Time: 11/21/19  8:24 PM  Result Value Ref Range   Sodium 137 135 - 145 mmol/L   Potassium 3.8 3.5 - 5.1 mmol/L   Chloride 103 98 - 111 mmol/L   CO2 22 22 - 32 mmol/L   Glucose, Bld 118 (H) 70 - 99 mg/dL    Comment: Glucose reference range applies only to samples taken after fasting for at least 8 hours.   BUN 15 6 - 20 mg/dL   Creatinine, Ser 1.611.01 0.61 - 1.24 mg/dL   Calcium 9.2 8.9 - 09.610.3 mg/dL   Total Protein 7.9 6.5 - 8.1 g/dL   Albumin 4.3 3.5 - 5.0 g/dL   AST 23 15 - 41 U/L   ALT 15 0 - 44 U/L   Alkaline Phosphatase 51 38 - 126 U/L   Total Bilirubin 1.2 0.3 - 1.2 mg/dL   GFR calc non Af Amer >60 >60 mL/min   GFR calc Af Amer >60 >60 mL/min   Anion gap 12 5 - 15    Comment: Performed at [facility], [address] [place], [place] [address]  Ethanol     Status: None   Collection Time: 11/21/19  8:24 PM  Result Value Ref Range   Alcohol, Ethyl (B) <10 <10 mg/dL    Comment: (NOTE) Lowest detectable limit for serum alcohol is 10 mg/dL.  For medical purposes only. Performed at  [facility], [address] [place], [place] [address]   Salicylate level     Status: Abnormal   Collection Time: 11/21/19  8:24 PM  Result Value Ref Range   Salicylate Lvl <7.0 (L) 7.0 - 30.0 mg/dL    Comment: Performed at [facility], [address] [place], [place] [address]  Acetaminophen level     Status: Abnormal   Collection Time: 11/21/19  8:24 PM  Result Value Ref Range   Acetaminophen (Tylenol), Serum <10 (L) 10 - 30 ug/mL    Comment: (NOTE) Therapeutic concentrations vary significantly. A range of 10-30 ug/mL  may be an effective concentration for many patients. However, some  are best treated at concentrations outside of this range. Acetaminophen concentrations >150 ug/mL at 4 hours after ingestion  and >50 ug/mL at 12 hours after ingestion are often associated with  toxic reactions.  Performed at [facility], [address] [place], [place] [address]   cbc     Status: Abnormal   Collection Time: 11/21/19  8:24 PM  Result Value Ref Range   WBC 11.4 (H) 4.0 - 10.5 K/uL   RBC 4.97 4.22 - 5.81 MIL/uL   Hemoglobin 14.3 13.0 - 17.0 g/dL   HCT 30.841.7 39 - 52 %   MCV 83.9 80.0 - 100.0 fL   MCH 28.8 26.0 - 34.0 pg   MCHC 34.3 30.0 - 36.0 g/dL   RDW 65.713.4 84.611.5 - 96.215.5 %   Platelets 293 150 - 400 K/uL   nRBC 0.0 0.0 - 0.2 %    Comment: Performed at [facility], [address] [place], [place] [address]  Urine Drug Screen, Qualitative     Status: None   Collection Time: 11/21/19  8:39 PM  Result Value Ref Range   Tricyclic, Ur Screen NONE DETECTED NONE  DETECTED   Amphetamines, Ur Screen NONE DETECTED NONE DETECTED   MDMA (Ecstasy)Ur Screen NONE DETECTED NONE DETECTED   Cocaine Metabolite,Ur [place] NONE DETECTED NONE DETECTED   Opiate, Ur Screen NONE DETECTED NONE DETECTED   Phencyclidine (PCP) Ur S NONE DETECTED NONE DETECTED   Cannabinoid 50 Ng, Ur [place] NONE DETECTED NONE DETECTED   Barbiturates, Ur Screen NONE DETECTED NONE  DETECTED   Benzodiazepine, Ur Scrn NONE DETECTED NONE DETECTED   Methadone Scn, Ur NONE DETECTED NONE DETECTED    Comment: (NOTE) Tricyclics + metabolites, urine    Cutoff 1000 ng/mL Amphetamines + metabolites, urine  Cutoff 1000 ng/mL MDMA (Ecstasy), urine              Cutoff 500 ng/mL Cocaine Metabolite, urine          Cutoff 300 ng/mL Opiate + metabolites, urine        Cutoff 300 ng/mL Phencyclidine (PCP), urine         Cutoff 25 ng/mL Cannabinoid, urine                 Cutoff 50 ng/mL Barbiturates + metabolites, urine  Cutoff 200 ng/mL Benzodiazepine, urine              Cutoff 200 ng/mL Methadone, urine                   Cutoff 300 ng/mL  The urine drug screen provides only a preliminary, unconfirmed analytical test result and should not be used for non-medical purposes. Clinical consideration and professional judgment should be applied to any positive drug screen result due to possible interfering substances. A more specific alternate chemical method must be used in order to obtain a confirmed analytical result. Gas chromatography / mass spectrometry (GC/MS) is the preferred confirm atory method. Performed at [facility], [address] [place], [place] [address]     No current facility-administered medications for this encounter.   Current Outpatient Medications  Medication Sig Dispense Refill  . albuterol (VENTOLIN HFA) 108 (90 Base) MCG/ACT inhaler Inhale 2 puffs into the lungs every 6 (six) hours as needed for wheezing or shortness of breath. 18 g 0  . citalopram (CELEXA) 20 MG tablet Take 1 tablet (20 mg total) by mouth daily. 30 tablet 0  . divalproex (DEPAKOTE) 500 MG DR tablet Take 1 tablet (500 mg total) by mouth every 12 (twelve) hours. 60 tablet 0  . fluticasone furoate-vilanterol (BREO ELLIPTA) 200-25 MCG/INH AEPB Inhale 1 puff into the lungs daily. 30 each 0  . hydrOXYzine (ATARAX/VISTARIL) 25 MG tablet Take 1 tablet (25 mg total) by mouth every 6  (six) hours as needed for anxiety. 30 tablet 0  . QUEtiapine (SEROQUEL) 100 MG tablet Take 1 tablet (100 mg total) by mouth at bedtime. 30 tablet 0    Musculoskeletal: Strength & Muscle Tone: within normal limits Gait & Station: normal Patient leans: N/A  Psychiatric Specialty Exam: Physical Exam Vitals and nursing note reviewed.  Pulmonary:     Effort: Pulmonary effort is normal.  Musculoskeletal:        General: Normal range of motion.     Cervical back: Normal range of motion and neck supple.  Neurological:     Mental Status: He is alert and oriented to person, place, and time.  Psychiatric:        Attention and Perception: Attention and perception normal.        Mood and Affect: Mood and affect normal.  Speech: Speech normal.        Behavior: Behavior normal. Behavior is cooperative.        Thought Content: Thought content includes suicidal ideation.        Cognition and Memory: Cognition normal.        Judgment: Judgment is inappropriate.     Review of Systems  Psychiatric/Behavioral: Positive for suicidal ideas. The patient is nervous/anxious.   All other systems reviewed and are negative.   Blood pressure 127/69, pulse 85, temperature 98.2 F (36.8 C), temperature source Oral, resp. rate 17, height  (1.727 m), weight (!) 99.8 kg, SpO2 97 %.Body mass index is 33.45 kg/m.  General Appearance: Casual  Eye Contact:  Good  Speech:  Clear and Coherent  Volume:  Normal  Mood:  NA  Affect:  Appropriate, Congruent and Flat  Thought Process:  Coherent  Orientation:  Full (Time, Place, and Person)  Thought Content:  Logical  Suicidal Thoughts:  Yes.  with intent/plan  Homicidal Thoughts:  No  Memory:  Immediate;   Good Recent;   Fair Remote;   Fair  Judgement:  Good  Insight:  Fair  Psychomotor Activity:  Increased  Concentration:  Concentration: Good and Attention Span: Good  Recall:  Fair  Fund of Knowledge:  Good  Language:  Good  Akathisia:   Negative  Handed:  Right  AIMS (if indicated):     Assets:  Communication Skills Desire for Improvement Financial Resources/Insurance Housing Resilience Social Support  ADL's:  Intact  Cognition:  WNL  Sleep:    Insomnia     Treatment Plan Summary: Plan The patient remained under observation overnight and will be reassessed in the a.m. to determine if he meets the criteria for psychiatric inpatient admission; he could be discharged back home.  Disposition: Supportive therapy provided about ongoing stressors. The patient remained under observation overnight and will be reassessed in the a.m. to determine if he meets the criteria for psychiatric inpatient admission; he could be discharged back home.  Gillermo Murdoch, NP 11/21/2019 10:10 PM

## 2019-11-21 NOTE — ED Notes (Signed)
Pt brought into ED BHU via sally port and wand with metal detector for safety by [place] Security officer. Patient oriented to unit/care area: Pt informed of unit policies and procedures.  Informed that, for their safety, care areas are designed for safety and monitored by security cameras at all times. Patient verbalizes understanding, and verbal contract for safety obtained.Pt shown to their room.  

## 2019-11-21 NOTE — ED Triage Notes (Signed)
Pt BELONGINGS:  Pt came with LARGE black trash bag with clothes and cigarettes, Black [place] book bag with clothes toys for daughter, masks, cigarettes  Pt also has shoes, watch, bracelets, chain necklace, socks, gray shorts, gray shirt, cell phone and ink pens  Pt dressed out by Sonny Dandy RN and Rosezella Florida EDT

## 2019-11-21 NOTE — ED Notes (Signed)
Report given to [facility] in [place].

## 2019-11-21 NOTE — ED Provider Notes (Signed)
[facility] Emergency Department Provider Note ____________________________________________   First MD Initiated Contact with Patient 11/21/19 2039     (approximate)  I have reviewed the triage vital signs and the nursing notes.  HISTORY  Chief Complaint Suicidal   HPI Nathan Koch is a 29 y.o. male presented to the ED for evaluation of suicidal ideations.    History of anxiety and depression on SSRI and as needed Ativan and Vistaril. Patient ports a history of methamphetamine abuse, now clean for 60 days.  Patient reports being in rehab for the past week and doing well.  He reports having a court date yesterday, and when he arrived he found out that he was actually 5 days late.  He was put in jail overnight and then when he returned to rehab they "just kicked me out to the street."  Patient ports hitchhiking and walking here.  He reports a strong desire for methamphetamine, but he abstained this afternoon.  He reports having intrusive thoughts of suicidality over the past 4 hours, with a plan to jump in front of traffic to kill himself.  Patient denies any fevers or recent medical illnesses.   Past Medical History:  Diagnosis Date  . Anxiety   . Asthma   . Depression   . Methamphetamine abuse [facility])     Patient Active Problem List   Diagnosis Date Noted  . 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 MDD (major depressive disorder), recurrent, severe, with psychosis ([name]) 09/14/2019  . MDD (major depressive disorder), single episode, severe with psychosis ([name]) 09/14/2019  . Asthma 06/26/2014    Past Surgical History:  Procedure Laterality Date  . CLOSED REDUCTION MANDIBLE N/A 07/02/2018   Procedure: CLOSED REDUCTION MANDIBULAR;  Surgeon: Vernie Murders, MD;  Location: [facility] ORS;  Service: ENT;  Laterality: N/A;    Prior to Admission medications   Medication Sig Start Date  End Date Taking? Authorizing Provider  albuterol (VENTOLIN HFA) 108 (90 Base) MCG/ACT inhaler Inhale 2 puffs into the lungs every 6 (six) hours as needed for wheezing or shortness of breath. 10/17/19   Clapacs, Jackquline Denmark, MD  citalopram (CELEXA) 20 MG tablet Take 1 tablet (20 mg total) by mouth daily. 10/18/19   Clapacs, Jackquline Denmark, MD  divalproex (DEPAKOTE) 500 MG DR tablet Take 1 tablet (500 mg total) by mouth every 12 (twelve) hours. 10/17/19   Clapacs, Jackquline Denmark, MD  fluticasone furoate-vilanterol (BREO ELLIPTA) 200-25 MCG/INH AEPB Inhale 1 puff into the lungs daily. 10/18/19   Clapacs, Jackquline Denmark, MD  hydrOXYzine (ATARAX/VISTARIL) 25 MG tablet Take 1 tablet (25 mg total) by mouth every 6 (six) hours as needed for anxiety. 10/17/19   Clapacs, Jackquline Denmark, MD  QUEtiapine (SEROQUEL) 100 MG tablet Take 1 tablet (100 mg total) by mouth at bedtime. 10/17/19   Clapacs, Jackquline Denmark, MD    Allergies Ibuprofen  Family History  Problem Relation Age of Onset  . Asthma Mother   . Asthma Brother     Social History Social History   Tobacco Use  . Smoking status: Current Every Day Smoker    Packs/day: 1.00    Types: Cigarettes  . Smokeless tobacco: Never Used  . Tobacco comment: started smoking again December 2019  Substance Use Topics  . Alcohol use: Not Currently    Alcohol/week: 7.0 standard drinks    Types: 7 Standard drinks or equivalent per week  . Drug use: Yes  Types: Methamphetamines    Comment: non for 60 days    Review of Systems  Constitutional: No fever/chills Eyes: No visual changes. ENT: No sore throat. Cardiovascular: Denies chest pain. Respiratory: Denies shortness of breath. Gastrointestinal: No abdominal pain.  No nausea, no vomiting.  No diarrhea.  No constipation. Genitourinary: Negative for dysuria. Musculoskeletal: Negative for back pain. Skin: Negative for rash. Neurological: Negative for headaches, focal weakness or numbness. Psychiatry: Positive for  suicidality.  ____________________________________________   PHYSICAL EXAM:  VITAL SIGNS: ED Triage Vitals  Enc Vitals Group     BP 11/21/19 2023 127/69     Pulse Rate 11/21/19 2023 85     Resp 11/21/19 2023 17     Temp 11/21/19 2023 98.2 F (36.8 C)     Temp Source 11/21/19 2023 Oral     SpO2 11/21/19 2023 97 %     Weight 11/21/19 2023 (!) 220 lb (99.8 kg)     Height 11/21/19 2023 5\' 8"  (1.727 m)     Head Circumference --      Peak Flow --      Pain Score 11/21/19 2027 6     Pain Loc --      Pain Edu? --      Excl. in GC? --      Constitutional: Alert and oriented. Well appearing and in no acute distress.  Pleasant and conversational in full sentences. Eyes: Conjunctivae are normal. PERRL. EOMI. Head: Atraumatic. Nose: No congestion/rhinnorhea. Mouth/Throat: Mucous membranes are moist.  Oropharynx non-erythematous. Neck: No stridor. No cervical spine tenderness to palpation. Cardiovascular: Normal rate, regular rhythm. Grossly normal heart sounds.  Good peripheral circulation. Respiratory: Normal respiratory effort.  No retractions. Lungs CTAB. Gastrointestinal: Soft , nondistended, nontender to palpation. No abdominal bruits. No CVA tenderness. Musculoskeletal: No lower extremity tenderness nor edema.  No joint effusions. No signs of acute trauma. Neurologic:  Normal speech and language. No gross focal neurologic deficits are appreciated. No gait instability noted. Skin:  Skin is warm, dry and intact. No rash noted. Psychiatric: Mood and affect are normal. Speech and behavior are normal.  ____________________________________________   LABS (all labs ordered are listed, but only abnormal results are displayed)  Labs Reviewed  COMPREHENSIVE METABOLIC PANEL - Abnormal; Notable for the following components:      Result Value   Glucose, Bld 118 (*)    All other components within normal limits  SALICYLATE LEVEL - Abnormal; Notable for the following components:    Salicylate Lvl <7.0 (*)    All other components within normal limits  ACETAMINOPHEN LEVEL - Abnormal; Notable for the following components:   Acetaminophen (Tylenol), Serum <10 (*)    All other components within normal limits  CBC - Abnormal; Notable for the following components:   WBC 11.4 (*)    All other components within normal limits  SARS CORONAVIRUS 2 BY RT PCR (HOSPITAL ORDER, PERFORMED IN  HOSPITAL LAB)  ETHANOL  URINE DRUG SCREEN, QUALITATIVE ([facility] ONLY)   ____________________________________________  12 Lead EKG   ____________________________________________  RADIOLOGY  ED MD interpretation:    Official radiology report(s): No results found.  ____________________________________________   PROCEDURES  Procedure(s) performed (including Critical Care):  Procedures   ____________________________________________   INITIAL IMPRESSION / ASSESSMENT AND PLAN / ED COURSE  29 year old male with history of methamphetamine abuse and depression, presenting with suicidal ideations with plan, requiring psychiatric disposition.  Normal vital signs on room air.  Exam with a pleasant patient who endorses suicidal thoughts  with a plan to harm himself by jumping in front of traffic.  Unremarkable blood work and patient is medically cleared for psychiatric evaluation.  Disposition per their recommendations.      ____________________________________________   FINAL CLINICAL IMPRESSION(S) / ED DIAGNOSES  Final diagnoses:  Suicidal thoughts  Methamphetamine abuse in remission [facility])     ED Discharge Orders    None       Willer [place]orno   Note:  This document was prepared using Dragon voice recognition software and may include unintentional dictation errors.   Delton [place], MD 11/21/19 2145

## 2019-11-21 NOTE — ED Triage Notes (Addendum)
Pt arrived via POV voluntarily with reports of SI x the past 3 hours, pt has plan to jump into traffic.  Pt denies any AVH.  Pt has hx of anxiety and depression, pt currently on celexa, vistaril, ativan,   Pt states he missed his dose last night, pt was in jail x 1 and got out at 3pm.  Pt states he hitch-hiked.  Pt states he is currently homeless.

## 2019-11-21 NOTE — ED Notes (Signed)

## 2019-11-22 LAB — SARS CORONAVIRUS 2 BY RT PCR (HOSPITAL ORDER, PERFORMED IN ~~LOC~~ HOSPITAL LAB): SARS Coronavirus 2: NEGATIVE

## 2019-11-22 NOTE — BH Assessment (Signed)
Late Entry- Writer provided the patient with other resources for treatment options, that were shared by [facility]. Patient had already contacted them and are their wait list or waiting to hear back from them.

## 2019-11-22 NOTE — ED Notes (Signed)

## 2019-11-22 NOTE — ED Provider Notes (Signed)
Emergency Medicine Observation Re-evaluation Note  Nathan Koch is a 29 y.o. male, seen on rounds today.  Pt initially presented to the ED for complaints of Suicidal Currently, the patient is stable, resting comfortably. Transferred to BHU overnight.  Physical Exam  BP 127/69 (BP Location: Left Arm)   Pulse 85   Temp 98.2 F (36.8 C) (Oral)   Resp 17   Ht 5\' 8"  (1.727 m)   Wt (!) 99.8 kg   SpO2 97%   BMI 33.45 kg/m  Physical Exam   GEN: No acute distress, resting comfortably HEENT: NCAT Neck: Supple Lungs: Normal WOB, no resp distress Neuro: AOx3, no focal deficits Psych: Calm, resting  ED Course / MDM  EKG:    I have reviewed the labs performed to date as well as medications administered while in observation.  Recent changes in the last 24 hours include: none, labs unremarkable. Plan  Current plan is for psych evaluation and disposition. Patient is not under full IVC at this time.   , MD 11/22/19 1734

## 2019-11-22 NOTE — BH Assessment (Signed)
Writer discussed with patient his continued interest in Substance Abuse Treatment, he reports being clean for 60 days therefore patient does not meet criteria for detox. Writer discussed with patient [facility] and [facility] and patient is open to being referred to both.

## 2019-11-22 NOTE — BH Assessment (Signed)
Assessment Note  Nathan Koch is an 29 y.o. male who presents to the ER due to having thoughts of ending his life, after he found out, he was discharged from his [facility] facility ([facility]). Per the patient, he was in the long-term treatment facility for approximately two months, when he found out he recently missed a court date. Thus, he turned his self in, so he wouldn't get an additional charge of failure to appear. He states, informed the staff at the facility of the plan. However, after two days, he was released from jail and tried to get back in the facility and that's when he discovered he was discharged from them.  Per RTS, the patient was with them but they weren't aware of him leaving the facility to turn his self in. Prior to this event, the patient was on restrictions, which he wasn't supposed to leave the facility. When he signed himself out, it wasn't communicated where or why he was leaving. Thus, they packed his belongings. After he returned in two days, he no longer had a bed. However, even if patient would have called the facility while he was in jail, they would have saved his bed but they didn't' hear from him and didn't know what was going on.  During the interview, the patient was calm, cooperative and pleasant. He was able to provide appropriate answers to the questions. He stated, when he found out he wasn't able to return, he was having thoughts of ending his life because he knew, he was going to eventually relapse. In that moment he felt death was better than relapse. Thus, he came to the ER for help because he was afraid of what he was going to do.  Diagnosis: Depression and Substance Disorder  Past Medical History:  Past Medical History:  Diagnosis Date  . Anxiety   . Asthma   . Depression   . Methamphetamine abuse [facility])     Past Surgical History:  Procedure Laterality Date  . CLOSED REDUCTION MANDIBLE N/A 07/02/2018   Procedure: CLOSED REDUCTION MANDIBULAR;   Surgeon: Vernie Murders, MD;  Location: [facility] ORS;  Service: ENT;  Laterality: N/A;    Family History:  Family History  Problem Relation Age of Onset  . Asthma Mother   . Asthma Brother     Social History:  reports that he has been smoking cigarettes. He has been smoking about 1.00 pack per day. He has never used smokeless tobacco. He reports previous alcohol use of about 7.0 standard drinks of alcohol per week. He reports current drug use. Drug: Methamphetamines.  Additional Social History:  Alcohol / Drug Use Pain Medications: See PTA Prescriptions: See PTA Over the Counter: See PTA History of alcohol / drug use?: Yes  CIWA: CIWA-Ar BP: 115/81 Pulse Rate: 84 COWS:    Allergies:  Allergies  Allergen Reactions  . Ibuprofen Swelling    Home Medications: (Not in a hospital admission)   OB/GYN Status:  No LMP for male patient.  General Assessment Data Location of Assessment: [facility] ED TTS Assessment: In system Is this a Tele or Face-to-Face Assessment?: Face-to-Face Is this an Initial Assessment or a Re-assessment for this encounter?: Initial Assessment Patient Accompanied by:: N/A Language Other than English: No Living Arrangements: Homeless/Shelter What gender do you identify as?: Male Date Telepsych consult ordered in [facility]: 11/21/19 Time Telepsych consult ordered in [facility]: 2308 Marital status: Single Pregnancy Status: No Living Arrangements: Other (Comment) (Homeless) Can pt return to current living arrangement?: Yes  Admission Status: Voluntary Is patient capable of signing voluntary admission?: Yes Referral Source: Self/Family/Friend Insurance type: None  Medical Screening Exam [facility] Walk-in ONLY) Medical Exam completed: Yes  Crisis Care Plan Living Arrangements: Other (Comment) (Homeless) Legal Guardian: Other: (Self) Name of Psychiatrist: [facility] Name of Therapist: [facility]  Education Status Is patient currently in school?: No Highest grade of school patient has  completed: 9th Is the patient employed, unemployed or receiving disability?: Unemployed  Risk to self with the past 6 months Suicidal Ideation: No-Not Currently/Within Last 6 Months Has patient been a risk to self within the past 6 months prior to admission? : Yes Suicidal Intent: No Has patient had any suicidal intent within the past 6 months prior to admission? : Yes Is patient at risk for suicide?: No Suicidal Plan?: No-Not Currently/Within Last 6 Months Has patient had any suicidal plan within the past 6 months prior to admission? : Yes Specify Current Suicidal Plan: None reported Access to Means: No What has been your use of drugs/alcohol within the last 12 months?: No current use Previous Attempts/Gestures: No How many times?: 0 Other Self Harm Risks: Reports of none Triggers for Past Attempts: None known Intentional Self Injurious Behavior: None Family Suicide History: Unknown Recent stressful life event(s): Other (Comment), Loss (Comment) Persecutory voices/beliefs?: No Depression: Yes Depression Symptoms: Tearfulness, Loss of interest in usual pleasures, Feeling worthless/self pity, Feeling angry/irritable Substance abuse history and/or treatment for substance abuse?: Yes Suicide prevention information given to non-admitted patients: Not applicable  Risk to Others within the past 6 months Homicidal Ideation: No Does patient have any lifetime risk of violence toward others beyond the six months prior to admission? : No Thoughts of Harm to Others: No Current Homicidal Intent: No Current Homicidal Plan: No Access to Homicidal Means: No Identified Victim: Reports of none History of harm to others?: No Assessment of Violence: None Noted Violent Behavior Description: Reports of none Does patient have access to weapons?: No Criminal Charges Pending?: No Does patient have a court date: No Is patient on probation?: No  Psychosis Hallucinations: None noted Delusions: None  noted  Mental Status Report Appearance/Hygiene: Unremarkable, In scrubs Eye Contact: Fair Motor Activity: Freedom of movement, Unremarkable Speech: Logical/coherent, Unremarkable Level of Consciousness: Alert Mood: Sad, Pleasant Affect: Appropriate to circumstance, Sad Anxiety Level: Minimal Thought Processes: Coherent, Relevant Judgement: Unimpaired Orientation: Person, Place, Time, Situation, Appropriate for developmental age Obsessive Compulsive Thoughts/Behaviors: None  Cognitive Functioning Concentration: Normal Memory: Recent Intact, Remote Intact Is patient IDD: No Insight: Fair Impulse Control: Fair Appetite: Fair Have you had any weight changes? : No Change Sleep: No Change Total Hours of Sleep: 7 Vegetative Symptoms: None  ADLScreening [facility] Assessment Services) Patient's cognitive ability adequate to safely complete daily activities?: Yes Patient able to express need for assistance with ADLs?: Yes Independently performs ADLs?: Yes (appropriate for developmental age)  Prior Inpatient Therapy Prior Inpatient Therapy: Yes Prior Therapy Dates: 09/2019, 08/2019 & 03/2019 Prior Therapy Facilty/Provider(s): [facility] Reason for Treatment: Substance Abuse, Depression  Prior Outpatient Therapy Prior Outpatient Therapy: Yes Prior Therapy Dates: Current Prior Therapy Facilty/Provider(s): [facility] Reason for Treatment: Depression and substance abuse Does patient have an ACCT team?: No Does patient have Intensive In-House Services?  : No Does patient have [facility] services? : No Does patient have P4CC services?: No  ADL Screening (condition at time of admission) Patient's cognitive ability adequate to safely complete daily activities?: Yes Is the patient deaf or have difficulty hearing?: No Does the patient  have difficulty seeing, even when wearing glasses/contacts?: No Does the patient have difficulty concentrating, remembering, or making decisions?:  No Patient able to express need for assistance with ADLs?: Yes Does the patient have difficulty dressing or bathing?: No Independently performs ADLs?: Yes (appropriate for developmental age) Does the patient have difficulty walking or climbing stairs?: No Weakness of Legs: None Weakness of Arms/Hands: None  Home Assistive Devices/Equipment Home Assistive Devices/Equipment: None  Therapy Consults (therapy consults require a physician order) PT Evaluation Needed: No OT Evalulation Needed: No SLP Evaluation Needed: No Abuse/Neglect Assessment (Assessment to be complete while patient is alone) Abuse/Neglect Assessment Can Be Completed: Yes Physical Abuse: Denies Verbal Abuse: Denies Sexual Abuse: Denies Exploitation of patient/patient's resources: Denies Self-Neglect: Denies Values / Beliefs Cultural Requests During Hospitalization: None Spiritual Requests During Hospitalization: None Consults Spiritual Care Consult Needed: No Transition of Care Team Consult Needed: No Advance Directives (For Healthcare) Does Patient Have a Medical Advance Directive?: No Would patient like information on creating a medical advance directive?: No - Patient declined  Disposition:  Disposition Initial Assessment Completed for this Encounter: Yes  On Site Evaluation by:   Reviewed with Physician:    Lilyan Gilford MS, LCAS, [facility], [facility] Therapeutic Triage Specialist 11/22/2019 6:58 PM

## 2019-11-22 NOTE — BH Assessment (Addendum)
Referral completed and faxed to following facilities:   [facility] [phone]) Staff reports they have the referral, nurse will print referral and give to the admission team, admission team will contact TTS back to conduct the screening via phone today.   [facility] Client Screening Form completed and faxed.([phone])

## 2019-11-23 NOTE — ED Provider Notes (Signed)
Emergency Medicine Observation Re-evaluation Note  Nathan Koch is a 29 y.o. male, seen on rounds today.  Pt initially presented to the ED for complaints of Suicidal Currently, the patient is resting comfortably, NAD.  Physical Exam  BP 124/66 (BP Location: Left Arm)   Pulse 95   Temp 98.7 F (37.1 C) (Oral)   Resp 18   Ht 5\' 8"  (1.727 m)   Wt (!) 99.8 kg   SpO2 96%   BMI 33.45 kg/m  Physical Exam  Gen:  no acute distress Resp:  no difficulty breathing, no accessory muscle usage Neuro:  moving all four extremities Psych:  calm and cooperative   ED Course / MDM  EKG:    I have reviewed the labs performed to date as well as medications administered while in observation.  Recent changes in the last 24 hours include none. Plan  Current plan is for psych dispo. Patient is under full IVC at this time.   , MD 11/23/19 [phone]

## 2019-11-23 NOTE — ED Notes (Signed)
Vol /pending placement 

## 2019-11-23 NOTE — BH Assessment (Signed)
Writer spoke with patient about option with [facility] [phone]), he confirmed the list of medications were accurate. Psychiatrist signed the form and TTS faxed it to [facility].  Writer spoke with [facility]) and confirmed faxed was received, alone with the paperwork from last night TTS faxed.  Spoke with [facility] Olegario Messier) again to follow up with referral, patient continues to be reviewed. While have a possible answer tomorrow (11/24/2019).

## 2019-11-23 NOTE — ED Notes (Signed)
Pt given breakfast tray and sprite 

## 2019-11-24 MED ORDER — ACETAMINOPHEN 325 MG PO TABS
650.0000 mg | ORAL_TABLET | Freq: Four times a day (QID) | ORAL | Status: DC | PRN
  Administered 2019-11-24 – 2019-11-25 (×3): 650 mg via ORAL
  Filled 2019-11-24 (×3): qty 2

## 2019-11-24 MED ORDER — ALBUTEROL SULFATE HFA 108 (90 BASE) MCG/ACT IN AERS
2.0000 | INHALATION_SPRAY | RESPIRATORY_TRACT | Status: DC | PRN
  Administered 2019-11-24 – 2019-11-25 (×2): 2 via RESPIRATORY_TRACT
  Filled 2019-11-24: qty 6.7

## 2019-11-24 NOTE — BH Assessment (Signed)
11:08 am This Clinical research associate spoke to Nathan Koch at [facility] [phone]). Robby Sermon reported that pt is currently doing a phone interview with Catering manager. Robby Sermon also reported that they are expecting an admission which would bring them to capacity, however the pt would be first on the waiting list.

## 2019-11-24 NOTE — ED Provider Notes (Signed)
Emergency Medicine Observation Re-evaluation Note  Nathan Koch is a 29 y.o. male, seen on rounds today.  Pt initially presented to the ED for complaints of Suicidal Currently, the patient is stable with no complaints this morning.  Physical Exam  BP (!) 128/58 (BP Location: Left Arm)   Pulse 76   Temp 98 F (36.7 C) (Oral)   Resp 18   Ht 5\' 8"  (1.727 m)   Wt (!) 99.8 kg   SpO2 98%   BMI 33.45 kg/m  Physical Exam   General: No apparent distress HEENT: moist mucous membranes CV: RRR Pulm: Normal WOB GI: soft and non tender MSK: no edema or cyanosis Neuro: face symmetric, moving all extremities    ED Course / MDM  EKG:    I have reviewed the labs performed to date as well as medications administered while in observation.  No changes overnight or any new labs. Plan  Current plan is for psychiatric disposition. Patient is under full IVC at this time.   , Don Perking, MD 11/24/19 [phone]

## 2019-11-24 NOTE — BH Assessment (Signed)
9:48am Writer left a HIPPA compliant message with [phone]) to follow up on pt's referral.

## 2019-11-24 NOTE — ED Notes (Signed)
Pt given sandwich tray and sprit for snack.

## 2019-11-24 NOTE — BH Assessment (Signed)
Updated disposition: Per psych MD, patient can wait in the BHU until a pending bed becomes available at [facility].

## 2019-11-25 NOTE — BH Assessment (Signed)
-  5:56PM [facility] [phone]) called to report that there are no treatment beds available and advised this writer to call back on Monday.

## 2019-11-25 NOTE — BH Assessment (Signed)
8:32am This writer called [facility] [phone]) for a referral check, however there was no answer.

## 2019-11-25 NOTE — ED Notes (Signed)
Pt approached RN stating that he would like to leave facility. RN spoke to pt multiple time to ensure pt wishes prior to contacting MD. Pt denies and SI or HI, and reports that he when speaking to wife on phone she asked for his help. Pt states " I feel like this is a chance to get my family back. They need my help to move some stuff, and for my moms wife to ask me for help is a big deal. I feel like I need to redeem myself." Pt states if he feels the desire to use drugs again he will return to ED. MD notified of pt wishes at this time

## 2019-11-25 NOTE — ED Provider Notes (Signed)
Patient cleared by psychiatry and has no suicidal homicidal ideation.  He was here for voluntary detox but has family obligations.  He is amenable to following up as an outpatient and again denies any intentional self-harm.  He has resources provided by TTS which I have confirmed.  Encouraged him to return with any concerns.   Shaune Pollack, MD 11/25/19 1816

## 2019-11-25 NOTE — BH Assessment (Addendum)
Faxed a referral to [facility] (Ph: [phone]; Fax: [phone]) for review.   -4:48 PM This Clinical research associate left a [employer] with [facility] [phone]).

## 2019-11-25 NOTE — ED Provider Notes (Signed)
Emergency Medicine Observation Re-evaluation Note  Nathan Koch is a 29 y.o. male, seen on rounds today.  Pt initially presented to the ED for complaints of Suicidal Currently, the patient is resting comfortably without distress, no complaints or needs..  Physical Exam  BP 126/74 (BP Location: Left Arm)   Pulse 71   Temp 98.2 F (36.8 C) (Oral)   Resp 18   Ht 5\' 8"  (1.727 m)   Wt (!) 99.8 kg   SpO2 100%   BMI 33.45 kg/m  Physical Exam Constitutional:      General: He is not in acute distress.    Appearance: Normal appearance. He is not toxic-appearing.  HENT:     Head: Normocephalic.  Eyes:     Pupils: Pupils are equal, round, and reactive to light.  Cardiovascular:     Rate and Rhythm: Normal rate.  Pulmonary:     Effort: Pulmonary effort is normal. No respiratory distress.  Abdominal:     General: There is no distension.  Musculoskeletal:        General: No deformity.  Skin:    General: Skin is warm and dry.  Neurological:     General: No focal deficit present.     ED Course / MDM  EKG:    I have reviewed the labs performed to date as well as medications administered while in observation.  Recent changes in the last 24 hours include none. Plan  Current plan is for continued full IVC with psychiatric placement. Patient is under full IVC at this time.   , MD 11/25/19 [phone]

## 2019-11-30 ENCOUNTER — Other Ambulatory Visit: Payer: Self-pay

## 2019-11-30 ENCOUNTER — Emergency Department
Admission: EM | Admit: 2019-11-30 | Discharge: 2019-12-02 | Disposition: A | Discharge status: Home / Self Care | Payer: Self-pay | Attending: Emergency Medicine | Admitting: Emergency Medicine

## 2019-11-30 DIAGNOSIS — X789XXA Intentional self-harm by unspecified sharp object, initial encounter: Secondary | ICD-10-CM | POA: Insufficient documentation

## 2019-11-30 DIAGNOSIS — Z20822 Contact with and (suspected) exposure to covid-19: Secondary | ICD-10-CM | POA: Insufficient documentation

## 2019-11-30 DIAGNOSIS — S61512A Laceration without foreign body of left wrist, initial encounter: Secondary | ICD-10-CM | POA: Insufficient documentation

## 2019-11-30 DIAGNOSIS — F17210 Nicotine dependence, cigarettes, uncomplicated: Secondary | ICD-10-CM | POA: Insufficient documentation

## 2019-11-30 DIAGNOSIS — F333 Major depressive disorder, recurrent, severe with psychotic symptoms: Secondary | ICD-10-CM | POA: Insufficient documentation

## 2019-11-30 DIAGNOSIS — R45851 Suicidal ideations: Secondary | ICD-10-CM | POA: Insufficient documentation

## 2019-11-30 DIAGNOSIS — Y9289 Other specified places as the place of occurrence of the external cause: Secondary | ICD-10-CM | POA: Insufficient documentation

## 2019-11-30 DIAGNOSIS — F32A Depression, unspecified: Secondary | ICD-10-CM

## 2019-11-30 DIAGNOSIS — Z7951 Long term (current) use of inhaled steroids: Secondary | ICD-10-CM | POA: Insufficient documentation

## 2019-11-30 DIAGNOSIS — Y9389 Activity, other specified: Secondary | ICD-10-CM | POA: Insufficient documentation

## 2019-11-30 DIAGNOSIS — Z23 Encounter for immunization: Secondary | ICD-10-CM | POA: Insufficient documentation

## 2019-11-30 DIAGNOSIS — Y999 Unspecified external cause status: Secondary | ICD-10-CM | POA: Insufficient documentation

## 2019-11-30 DIAGNOSIS — J45909 Unspecified asthma, uncomplicated: Secondary | ICD-10-CM | POA: Insufficient documentation

## 2019-11-30 HISTORY — DX: Bipolar disorder, unspecified: F31.9

## 2019-11-30 LAB — COMPREHENSIVE METABOLIC PANEL
ALT: 19 U/L (ref 0–44)
AST: 37 U/L (ref 15–41)
Albumin: 4.8 g/dL (ref 3.5–5.0)
Alkaline Phosphatase: 59 U/L (ref 38–126)
Anion gap: 14 (ref 5–15)
BUN: 16 mg/dL (ref 6–20)
CO2: 21 mmol/L — ABNORMAL LOW (ref 22–32)
Calcium: 9.1 mg/dL (ref 8.9–10.3)
Chloride: 104 mmol/L (ref 98–111)
Creatinine, Ser: 1.12 mg/dL (ref 0.61–1.24)
GFR calc Af Amer: >60 mL/min (ref >60)
GFR calc non Af Amer: >60 mL/min (ref >60)
Glucose, Bld: 103 mg/dL — ABNORMAL HIGH (ref 70–99)
Potassium: 4.1 mmol/L (ref 3.5–5.1)
Sodium: 139 mmol/L (ref 135–145)
Total Bilirubin: 2.7 mg/dL — ABNORMAL HIGH (ref 0.3–1.2)
Total Protein: 8.6 g/dL — ABNORMAL HIGH (ref 6.5–8.1)

## 2019-11-30 LAB — CBC
HCT: 41.9 % (ref 39.0–52.0)
Hemoglobin: 14.5 g/dL (ref 13.0–17.0)
MCH: 28.7 pg (ref 26.0–34.0)
MCHC: 34.6 g/dL (ref 30.0–36.0)
MCV: 82.8 fL (ref 80.0–100.0)
Platelets: 314 10*3/uL (ref 150–400)
RBC: 5.06 MIL/uL (ref 4.22–5.81)
RDW: 14.1 % (ref 11.5–15.5)
WBC: 13.8 10*3/uL — ABNORMAL HIGH (ref 4.0–10.5)
nRBC: 0 % (ref 0.0–0.2)

## 2019-11-30 LAB — ETHANOL: Alcohol, Ethyl (B): <10 mg/dL (ref <10)

## 2019-11-30 LAB — SARS CORONAVIRUS 2 BY RT PCR (HOSPITAL ORDER, PERFORMED IN ~~LOC~~ HOSPITAL LAB): SARS Coronavirus 2: NEGATIVE

## 2019-11-30 LAB — ACETAMINOPHEN LEVEL: Acetaminophen (Tylenol), Serum: <10 ug/mL — ABNORMAL LOW (ref 10–30)

## 2019-11-30 LAB — SALICYLATE LEVEL: Salicylate Lvl: <7 mg/dL — ABNORMAL LOW (ref 7.0–30.0)

## 2019-11-30 MED ORDER — TETANUS-DIPHTH-ACELL PERTUSSIS 5-2.5-18.5 LF-MCG/0.5 IM SUSP
0.5000 mL | Freq: Once | INTRAMUSCULAR | Status: AC
  Administered 2019-11-30: 0.5 mL via INTRAMUSCULAR
  Filled 2019-11-30: qty 0.5

## 2019-11-30 NOTE — ED Notes (Signed)
Hourly rounding reveals patient in room. No complaints, stable, in no acute distress. Q15 minute rounds and monitoring via Security Cameras to continue. 

## 2019-11-30 NOTE — ED Notes (Signed)
Patient's black watch placed inside shoe in patient's labeled bag by this RN.

## 2019-11-30 NOTE — ED Notes (Signed)
Pt. Transferred to BHU from ED to room 1after screening for contraband. Report to include Situation, Background, Assessment and Recommendations from [facility]. Pt. Oriented to unit including Q15 minute rounds as well as the security cameras for their protection. Patient is alert and oriented, warm and dry in no acute distress. Patient reported SI and AVH. Denied HI, and AVH. Pt. Encouraged to let me know if needs arise.

## 2019-11-30 NOTE — ED Provider Notes (Signed)
[facility] Emergency Department Provider Note   ____________________________________________   I have reviewed the triage vital signs and the nursing notes.   HISTORY  Chief Complaint Suicidal   History limited by: Not Limited   HPI Nathan Koch is a 29 y.o. male who presents to the emergency department today because of concerns for depression, suicidal ideation and self-harm.  The patient states that his fiance broke up with him a couple of days ago.  Since then he has been homeless.  He states he does have a history of depression and has been off of his medication for quite some time because he is not able to afford it.  He did try to cut his left wrist and forearm today in an attempt to kill himself.  He denies any unusual ingestions.  Records reviewed. Per medical record review patient has a history of bipolar, depression, substance abuse.  Past Medical History:  Diagnosis Date  . Anxiety   . Asthma   . Bipolar 1 disorder ([name])   . Depression   . Methamphetamine abuse [facility])     Patient Active Problem List   Diagnosis Date Noted  . 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 MDD (major depressive disorder), recurrent, severe, with psychosis ([name]) 09/14/2019  . MDD (major depressive disorder), single episode, severe with psychosis ([name]) 09/14/2019  . Asthma 06/26/2014    Past Surgical History:  Procedure Laterality Date  . CLOSED REDUCTION MANDIBLE N/A 07/02/2018   Procedure: CLOSED REDUCTION MANDIBULAR;  Surgeon: Vernie Murders, MD;  Location: [facility] ORS;  Service: ENT;  Laterality: N/A;    Prior to Admission medications   Medication Sig Start Date End Date Taking? Authorizing Provider  albuterol (VENTOLIN HFA) 108 (90 Base) MCG/ACT inhaler Inhale 2 puffs into the lungs every 6 (six) hours as needed for wheezing or shortness of breath. 10/17/19   Clapacs, Jackquline Denmark, MD   citalopram (CELEXA) 20 MG tablet Take 1 tablet (20 mg total) by mouth daily. 10/18/19   Clapacs, Jackquline Denmark, MD  divalproex (DEPAKOTE) 500 MG DR tablet Take 1 tablet (500 mg total) by mouth every 12 (twelve) hours. 10/17/19   Clapacs, Jackquline Denmark, MD  fluticasone furoate-vilanterol (BREO ELLIPTA) 200-25 MCG/INH AEPB Inhale 1 puff into the lungs daily. 10/18/19   Clapacs, Jackquline Denmark, MD  hydrOXYzine (ATARAX/VISTARIL) 25 MG tablet Take 1 tablet (25 mg total) by mouth every 6 (six) hours as needed for anxiety. 10/17/19   Clapacs, Jackquline Denmark, MD  QUEtiapine (SEROQUEL) 100 MG tablet Take 1 tablet (100 mg total) by mouth at bedtime. 10/17/19   Clapacs, Jackquline Denmark, MD    Allergies Ibuprofen  Family History  Problem Relation Age of Onset  . Asthma Mother   . Asthma Brother     Social History Social History   Tobacco Use  . Smoking status: Current Every Day Smoker    Packs/day: 1.00    Types: Cigarettes  . Smokeless tobacco: Never Used  . Tobacco comment: started smoking again December 2019  Substance Use Topics  . Alcohol use: Not Currently    Alcohol/week: 7.0 standard drinks    Types: 7 Standard drinks or equivalent per week  . Drug use: Yes    Types: Methamphetamines    Comment: last use was yesterday    Review of Systems Constitutional: No fever/chills Eyes: No visual changes. ENT: No sore throat. Cardiovascular: Denies chest pain. Respiratory: Denies shortness of breath. Gastrointestinal:  No abdominal pain.  No nausea, no vomiting.  No diarrhea.   Genitourinary: Negative for dysuria. Musculoskeletal: Negative for back pain. Skin: Positive for lacerations to the left wrist, forearm. Neurological: Negative for headaches, focal weakness or numbness.  ____________________________________________   PHYSICAL EXAM:  VITAL SIGNS: ED Triage Vitals  Enc Vitals Group     BP 11/30/19 2015 129/72     Pulse Rate 11/30/19 2015 (!) 108     Resp 11/30/19 2015 18     Temp 11/30/19 2015 98.3 F (36.8 C)      Temp Source 11/30/19 2015 Oral     SpO2 11/30/19 2015 99 %     Weight 11/30/19 2016 220 lb (99.8 kg)     Height 11/30/19 2016 5\' 8"  (1.727 m)     Head Circumference --      Peak Flow --      Pain Score 11/30/19 2010 0   Constitutional: Alert and oriented.  Eyes: Conjunctivae are normal.  ENT      Head: Normocephalic and atraumatic.      Nose: No congestion/rhinnorhea.      Mouth/Throat: Mucous membranes are moist.      Neck: No stridor. Hematological/Lymphatic/Immunilogical: No cervical lymphadenopathy. Cardiovascular: Normal rate, regular rhythm.  No murmurs, rubs, or gallops.  Respiratory: Normal respiratory effort without tachypnea nor retractions. Breath sounds are clear and equal bilaterally. No wheezes/rales/rhonchi. Gastrointestinal: Soft and non tender. No rebound. No guarding.  Genitourinary: Deferred Musculoskeletal: Normal range of motion in all extremities. No lower extremity edema. Neurologic:  Normal speech and language. No gross focal neurologic deficits are appreciated.  Skin:  Laceration to left forearm. Psychiatric: Depressed. Endorses SI. ____________________________________________    LABS (pertinent positives/negatives)  Covid negative Acetaminophen, salicylate, ethanol below threshold CBC wbc 13.8, hgb 14.5, plt 314 CMP wnl except co2 21, glu 103, cr 1.12, t pro 8.6, t bili 2.7  ____________________________________________   EKG  None  ____________________________________________    RADIOLOGY  None  ____________________________________________   PROCEDURES  Procedures  ____________________________________________   INITIAL IMPRESSION / ASSESSMENT AND PLAN / ED COURSE  Pertinent labs & imaging results that were available during my care of the patient were reviewed by me and considered in my medical decision making (see chart for details).   Patient presented to the emergency department today because of concerns for depression and  suicidal ideation.  Patient did cut himself in the left forearm.  On exam these are all superficial wounds that do not require any advanced closure. Will place patient under IVC and have psychiatry evaluate.  The patient has been placed in psychiatric observation due to the need to provide a safe environment for the patient while obtaining psychiatric consultation and evaluation, as well as ongoing medical and medication management to treat the patient's condition.  The patient has been placed under full IVC at this time.   ____________________________________________   FINAL CLINICAL IMPRESSION(S) / ED DIAGNOSES  Final diagnoses:  Depression, unspecified depression type  Self-inflicted laceration of left wrist [facility])     Note: This dictation was prepared with Dragon dictation. Any transcriptional errors that result from this process are unintentional     [facility], MD 11/30/19 [phone]

## 2019-11-30 NOTE — ED Notes (Signed)
Patient reports SI. Superficial lacerations noted to left wrist.

## 2019-11-30 NOTE — ED Notes (Signed)
Bandaids applied to left wrist per MD

## 2019-11-30 NOTE — BH Assessment (Addendum)
Assessment Note  Nathan Koch is an 29 y.o. male presenting to [facility] ED under IVC. Per triage note Pt reports his finance kicked him out of her home Monday and had been having an affair. States that she has also spread rumors around about him. Pt states he has experienced suicidal thoughts since Saturday and attempted to kill himself today. Pt states he attempted to cut his left wrist and forearm with a knife but it was not sharp enough. Pt has superficial cuts on wrist that are not bleeding at this time. Pt tearful and crying off and on in triage at this time. During assessment patient is alert and oriented x4, cooperative, irritable, anxious and restless. When asked why patient was presenting to the ED he reported "a lot I'm going through stuff with my ex-fiancee' and I can't see my daughter she won't let me." Patient reports having a 65 month old daughter and reports having issues visitation to see her. Patient continues to report SI with a plan to cut himself and when patient arrived to the ED he had a pocket knife in his book bag. Patient is also currently homeless and reports that he was living with his ex-fiancee. Patient has a history of substance use but denies any use today. No current UDS available at this time. Patient has made an attempt to hurt himself via cutting himself and has lacerations to his left arm. Patient reports lack of appetite and poor sleep. Patient denies HI/AH/VH and does not appear to be responding to any internal or external stimuli  Per Psyc NP Barbara Cower patient is recommended to be observed overnight and reassessed in the morning  Diagnosis: Depression  Past Medical History:  Past Medical History:  Diagnosis Date  . Anxiety   . Asthma   . Bipolar 1 disorder ([name])   . Depression   . Methamphetamine abuse [facility])     Past Surgical History:  Procedure Laterality Date  . CLOSED REDUCTION MANDIBLE N/A 07/02/2018   Procedure: CLOSED REDUCTION MANDIBULAR;  Surgeon: Vernie Murders, MD;  Location: [facility] ORS;  Service: ENT;  Laterality: N/A;    Family History:  Family History  Problem Relation Age of Onset  . Asthma Mother   . Asthma Brother     Social History:  reports that he has been smoking cigarettes. He has been smoking about 1.00 pack per day. He has never used smokeless tobacco. He reports previous alcohol use of about 7.0 standard drinks of alcohol per week. He reports current drug use. Drug: Methamphetamines.  Additional Social History:  Alcohol / Drug Use Pain Medications: See MAR Prescriptions: See MAR Over the Counter: See MAR History of alcohol / drug use?: Yes Substance #1 Name of Substance 1: Methamphetamine  CIWA: CIWA-Ar BP: 129/72 Pulse Rate: (!) 108 COWS:    Allergies:  Allergies  Allergen Reactions  . Ibuprofen Swelling    Home Medications: (Not in a hospital admission)   OB/GYN Status:  No LMP for male patient.  General Assessment Data Location of Assessment: [facility] ED TTS Assessment: In system Is this a Tele or Face-to-Face Assessment?: Face-to-Face Is this an Initial Assessment or a Re-assessment for this encounter?: Initial Assessment Patient Accompanied by:: N/A Language Other than English: No Living Arrangements: Homeless/Shelter What gender do you identify as?: Male Marital status: Single Living Arrangements: Other (Comment) (Homeless) Can pt return to current living arrangement?: Yes Admission Status: Involuntary Petitioner: ED Attending Is patient capable of signing voluntary admission?: No Referral Source: Other  Insurance type: None  Medical Screening Exam [facility] Walk-in ONLY) Medical Exam completed: Yes  Crisis Care Plan Living Arrangements: Other (Comment) (Homeless) Legal Guardian: Other: (Self) Name of Psychiatrist: [facility] Name of Therapist: [facility]  Education Status Is patient currently in school?: No Highest grade of school patient has completed: 9th Is the patient employed, unemployed or receiving  disability?: Unemployed  Risk to self with the past 6 months Suicidal Ideation: Yes-Currently Present Has patient been a risk to self within the past 6 months prior to admission? : Yes Suicidal Intent: Yes-Currently Present Has patient had any suicidal intent within the past 6 months prior to admission? : Yes Is patient at risk for suicide?: Yes Suicidal Plan?: Yes-Currently Present Has patient had any suicidal plan within the past 6 months prior to admission? : Yes Specify Current Suicidal Plan: "To cut myself" Access to Means: Yes Specify Access to Suicidal Means: Patient had a pocket knife in book bag What has been your use of drugs/alcohol within the last 12 months?: Methamphetamine, Heroin Previous Attempts/Gestures: Yes How many times?: 1 Other Self Harm Risks: None Triggers for Past Attempts: Other (Comment), Spouse contact (Conflict with girlfriend, difficulty seeing children) Intentional Self Injurious Behavior: None Family Suicide History: Unknown Recent stressful life event(s): Conflict (Comment), Other (Comment) (Conflict with girlfriend, difficulty seeing daughter) Persecutory voices/beliefs?: No Depression: Yes Depression Symptoms: Tearfulness, Isolating, Loss of interest in usual pleasures, Feeling worthless/self pity, Feeling angry/irritable Substance abuse history and/or treatment for substance abuse?: Yes Suicide prevention information given to non-admitted patients: Not applicable  Risk to Others within the past 6 months Homicidal Ideation: No Does patient have any lifetime risk of violence toward others beyond the six months prior to admission? : No Thoughts of Harm to Others: No Current Homicidal Intent: No Current Homicidal Plan: No Access to Homicidal Means: No Identified Victim: None History of harm to others?: No Assessment of Violence: None Noted Violent Behavior Description: None Does patient have access to weapons?: No Criminal Charges Pending?:  No Does patient have a court date: No Is patient on probation?: No  Psychosis Hallucinations: None noted Delusions: None noted  Mental Status Report Appearance/Hygiene: In scrubs Eye Contact: Fair Motor Activity: Agitation, Freedom of movement Speech: Logical/coherent Level of Consciousness: Alert, Restless, Irritable Mood: Angry, Irritable Affect: Appropriate to circumstance, Anxious, Depressed Anxiety Level: Moderate Thought Processes: Coherent Judgement: Unimpaired Orientation: Person, Place, Time, Situation, Appropriate for developmental age Obsessive Compulsive Thoughts/Behaviors: None  Cognitive Functioning Concentration: Normal Memory: Recent Intact, Remote Intact Is patient IDD: No Insight: Fair Impulse Control: Poor Appetite: Poor Have you had any weight changes? : No Change Sleep: Decreased Total Hours of Sleep: 0 Vegetative Symptoms: None  ADLScreening [facility] Assessment Services) Patient's cognitive ability adequate to safely complete daily activities?: Yes Patient able to express need for assistance with ADLs?: Yes Independently performs ADLs?: Yes (appropriate for developmental age)  Prior Inpatient Therapy Prior Inpatient Therapy: Yes Prior Therapy Dates: 09/2019, 08/2019 & 03/2019 Prior Therapy Facilty/Provider(s): [facility] Reason for Treatment: Substance Abuse, Depression  Prior Outpatient Therapy Prior Outpatient Therapy: Yes Prior Therapy Dates: Current Prior Therapy Facilty/Provider(s): [facility] Reason for Treatment: Depression and substance abuse Does patient have an ACCT team?: No Does patient have Intensive In-House Services?  : No Does patient have [facility] services? : No Does patient have P4CC services?: No  ADL Screening (condition at time of admission) Patient's cognitive ability adequate to safely complete daily activities?: Yes Is the patient deaf or have difficulty hearing?: No Does  the patient have difficulty seeing,  even when wearing glasses/contacts?: No Does the patient have difficulty concentrating, remembering, or making decisions?: No Patient able to express need for assistance with ADLs?: Yes Does the patient have difficulty dressing or bathing?: No Independently performs ADLs?: Yes (appropriate for developmental age) Does the patient have difficulty walking or climbing stairs?: No Weakness of Legs: None Weakness of Arms/Hands: None  Home Assistive Devices/Equipment Home Assistive Devices/Equipment: None  Therapy Consults (therapy consults require a physician order) PT Evaluation Needed: No OT Evalulation Needed: No SLP Evaluation Needed: No Abuse/Neglect Assessment (Assessment to be complete while patient is alone) Abuse/Neglect Assessment Can Be Completed: Yes Physical Abuse: Denies Verbal Abuse: Denies Sexual Abuse: Denies Exploitation of patient/patient's resources: Denies Self-Neglect: Denies Values / Beliefs Cultural Requests During Hospitalization: None Spiritual Requests During Hospitalization: None Consults Spiritual Care Consult Needed: No Transition of Care Team Consult Needed: No Advance Directives (For Healthcare) Does Patient Have a Medical Advance Directive?: No          Disposition: Per Psyc NP Barbara Cower patient is recommended to be observed overnight and reassessed in the morning Disposition Initial Assessment Completed for this Encounter: Yes  On Site Evaluation by:   Reviewed with Physician:    Benay Pike MS LCASA 11/30/2019 10:34 PM

## 2019-11-30 NOTE — ED Notes (Addendum)
Pt with staff at this time after stating he was suicidal, remained with this nurse until car handoff completed. Pt does state at this time he placed a pocket knife in his bookbag, collected by staff and given to security. Bag and all belongings collected by staff.  Pt belongings collected include: 1 black book bag with 3 books, binder, toiletry bag with stress balls and sunglasses 1 pair black shoes 1 pair socks 1 pair shorts 1 white t shirt  1 pair underwear 1 black wallet 1 set car keys 1 silver necklace 1 black watch 1 debit card and rest of cards are personal cards, no cash in wallet

## 2019-11-30 NOTE — ED Triage Notes (Signed)
Pt reports his finance kicked him out of her home Monday and had been having an affair. States that she has also spread rumors around about him. Pt states he has experienced suicidal thoughts since Saturday and attempted to kill himself today. Pt states he attempted to cut his left wrist and forearm with a knife but it was not sharp enough. Pt has superficial cuts on wrist that are not bleeding at this time. Pt tearful and crying off and on in triage at this time.

## 2019-12-01 LAB — URINE DRUG SCREEN, QUALITATIVE (ARMC ONLY)
Amphetamines, Ur Screen: POSITIVE — AB
Barbiturates, Ur Screen: NOT DETECTED
Benzodiazepine, Ur Scrn: NOT DETECTED
Cannabinoid 50 Ng, Ur ~~LOC~~: NOT DETECTED
Cocaine Metabolite,Ur ~~LOC~~: NOT DETECTED
MDMA (Ecstasy)Ur Screen: NOT DETECTED
Methadone Scn, Ur: NOT DETECTED
Opiate, Ur Screen: NOT DETECTED
Phencyclidine (PCP) Ur S: NOT DETECTED
Tricyclic, Ur Screen: POSITIVE — AB

## 2019-12-01 MED ORDER — QUETIAPINE FUMARATE 25 MG PO TABS
100.0000 mg | ORAL_TABLET | Freq: Every day | ORAL | Status: DC
  Administered 2019-12-01 (×2): 100 mg via ORAL
  Filled 2019-12-01 (×2): qty 4

## 2019-12-01 MED ORDER — FLUTICASONE FUROATE-VILANTEROL 200-25 MCG/INH IN AEPB
1.0000 | INHALATION_SPRAY | Freq: Every day | RESPIRATORY_TRACT | Status: DC
  Filled 2019-12-01 (×2): qty 28

## 2019-12-01 MED ORDER — HYDROXYZINE HCL 25 MG PO TABS
25.0000 mg | ORAL_TABLET | Freq: Four times a day (QID) | ORAL | Status: DC | PRN
  Administered 2019-12-01 (×2): 25 mg via ORAL
  Filled 2019-12-01 (×2): qty 1

## 2019-12-01 MED ORDER — DIVALPROEX SODIUM 500 MG PO DR TAB
500.0000 mg | DELAYED_RELEASE_TABLET | Freq: Two times a day (BID) | ORAL | Status: DC
  Administered 2019-12-01 – 2019-12-02 (×4): 500 mg via ORAL
  Filled 2019-12-01 (×4): qty 1

## 2019-12-01 MED ORDER — ALBUTEROL SULFATE (2.5 MG/3ML) 0.083% IN NEBU: 3.0000 mL | INHALATION_SOLUTION | Freq: Four times a day (QID) | RESPIRATORY_TRACT | Status: DC | PRN

## 2019-12-01 MED ORDER — CITALOPRAM HYDROBROMIDE 20 MG PO TABS
20.0000 mg | ORAL_TABLET | Freq: Every day | ORAL | Status: DC
  Administered 2019-12-01 – 2019-12-02 (×2): 20 mg via ORAL
  Filled 2019-12-01 (×2): qty 1

## 2019-12-01 NOTE — ED Notes (Signed)
Hourly rounding reveals patient in room. No complaints, stable, in no acute distress. Q15 minute rounds and monitoring via Security Cameras to continue. 

## 2019-12-01 NOTE — ED Provider Notes (Signed)
Emergency Medicine Observation Re-evaluation Note  Nathan Koch is a 30 y.o. male, seen on rounds today.  Pt initially presented to the ED for complaints of Suicidal   Physical Exam  BP 129/72   Pulse (!) 108   Temp 98.3 F (36.8 C) (Oral)   Resp 18   Ht 5\' 8"  (1.727 m)   Wt 99.8 kg   SpO2 99%   BMI 33.45 kg/m  Physical Exam  Constitutional: Patient resting comfortably in bed Respiratory: Patient in no respiratory distress breathing comfortably. Psych: Patient is not agitated  ED Course / MDM  EKG:    I have reviewed the labs performed to date as well as medications administered while in observation.   Plan  Patient plan for discharge later.   , MD 12/01/19 [phone]

## 2019-12-01 NOTE — ED Notes (Signed)
Snack given to patient at this time.

## 2019-12-01 NOTE — ED Notes (Signed)
INVOLUNTARY, awaiting d/c when placement found.

## 2019-12-01 NOTE — BH Assessment (Addendum)
Late Entry- Writer spoke with the patient to complete an updated/reassessment. Patient denies SI/HI and AV/H.  Patient does not meet inpatient criteria.

## 2019-12-01 NOTE — Progress Notes (Signed)
[facility] MD Progress Note  12/01/2019 2:07 PM Nathan Koch  MRN:  [id number] Subjective:   I do not know  Principal Problem: <principal problem not specified> Diagnosis: Active Problems:   * No active hospital problems. *  Total Time spent with patient: 15-20  Past Psychiatric History:   multiple ER visits half way houses for substance use   Now back after arguments with wife and relapse after waiting for [address] house   He is trying to remain in [facility] due to being homeless rather than going  Back to wife's house  No other further issues or symptoms   CW trying to get transport for him to [facility]    Past Medical History:  Past Medical History:  Diagnosis Date  . Anxiety   . Asthma   . Bipolar 1 disorder ([name])   . Depression   . Methamphetamine abuse [facility])     Past Surgical History:  Procedure Laterality Date  . CLOSED REDUCTION MANDIBLE N/A 07/02/2018   Procedure: CLOSED REDUCTION MANDIBULAR;  Surgeon: Vernie Murders, MD;  Location: [facility] ORS;  Service: ENT;  Laterality: N/A;   Family History:  Family History  Problem Relation Age of Onset  . Asthma Mother   . Asthma Brother    Family Psychiatric  History:    Social History:  Social History   Substance and Sexual Activity  Alcohol Use Not Currently  . Alcohol/week: 7.0 standard drinks  . Types: 7 Standard drinks or equivalent per week     Social History   Substance and Sexual Activity  Drug Use Yes  . Types: Methamphetamines   Comment: last use was yesterday    Social History   Socioeconomic History  . Marital status: Single    Spouse name: Not on file  . Number of children: Not on file  . Years of education: Not on file  . Highest education level: Not on file  Occupational History  . Not on file  Tobacco Use  . Smoking status: Current Every Day Smoker    Packs/day: 1.00    Types: Cigarettes  . Smokeless tobacco: Never Used  . Tobacco comment: started smoking again December 2019  Substance and Sexual  Activity  . Alcohol use: Not Currently    Alcohol/week: 7.0 standard drinks    Types: 7 Standard drinks or equivalent per week  . Drug use: Yes    Types: Methamphetamines    Comment: last use was yesterday  . Sexual activity: Not on file  Other Topics Concern  . Not on file  Social History Narrative  . Not on file   Social Determinants of Health   Financial Resource Strain:   . Difficulty of Paying Living Expenses:   Food Insecurity:   . Worried About Programme researcher, broadcasting/film/video in the Last Year:   . Barista in the Last Year:   Transportation Needs:   . Freight forwarder (Medical):   Marland Kitchen Lack of Transportation (Non-Medical):   Physical Activity:   . Days of Exercise per Week:   . Minutes of Exercise per Session:   Stress:   . Feeling of Stress :   Social Connections:   . Frequency of Communication with Friends and Family:   . Frequency of Social Gatherings with Friends and Family:   . Attends Religious Services:   . Active Member of Clubs or Organizations:   . Attends Banker Meetings:   Marland Kitchen Marital Status:    Additional Social  History:    Pain Medications: See MAR Prescriptions: See MAR Over the Counter: See MAR History of alcohol / drug use?: Yes Name of Substance 1: Methamphetamine                  Sleep: normal  Appetite:  Normal   Current Medications: Current Facility-Administered Medications  Medication Dose Route Frequency Provider Last Rate Last Admin  . albuterol (PROVENTIL) (2.5 MG/3ML) 0.083% nebulizer solution 3 mL  3 mL Inhalation Q6H PRN Irean Hong, MD      . citalopram (CELEXA) tablet 20 mg  20 mg Oral Daily Irean Hong, MD   20 mg at 12/01/19 1048  . divalproex (DEPAKOTE) DR tablet 500 mg  500 mg Oral Q12H Irean Hong, MD   500 mg at 12/01/19 1048  . fluticasone furoate-vilanterol (BREO ELLIPTA) 200-25 MCG/INH 1 puff  1 puff Inhalation Daily Irean Hong, MD      . hydrOXYzine (ATARAX/VISTARIL) tablet 25 mg  25 mg Oral  Q6H PRN Irean Hong, MD   25 mg at 12/01/19 0109  . QUEtiapine (SEROQUEL) tablet 100 mg  100 mg Oral QHS Irean Hong, MD   100 mg at 12/01/19 0109   Current Outpatient Medications  Medication Sig Dispense Refill  . acetaminophen (TYLENOL) 500 MG tablet Take 500 mg by mouth every 6 (six) hours as needed for moderate pain.    Marland Kitchen albuterol (VENTOLIN HFA) 108 (90 Base) MCG/ACT inhaler Inhale 2 puffs into the lungs every 6 (six) hours as needed for wheezing or shortness of breath. 18 g 0  . cetirizine (ZYRTEC) 10 MG tablet Take 1 tablet by mouth daily.    . citalopram (CELEXA) 20 MG tablet Take 1 tablet (20 mg total) by mouth daily. 30 tablet 0  . divalproex (DEPAKOTE) 500 MG DR tablet Take 1 tablet (500 mg total) by mouth every 12 (twelve) hours. 60 tablet 0  . fluticasone furoate-vilanterol (BREO ELLIPTA) 200-25 MCG/INH AEPB Inhale 1 puff into the lungs daily. 30 each 0  . hydrOXYzine (ATARAX/VISTARIL) 25 MG tablet Take 1 tablet (25 mg total) by mouth every 6 (six) hours as needed for anxiety. 30 tablet 0  . Multiple Vitamin (MULTIVITAMIN WITH MINERALS) TABS tablet Take 1 tablet by mouth daily.    . QUEtiapine (SEROQUEL) 100 MG tablet Take 1 tablet (100 mg total) by mouth at bedtime. 30 tablet 0    Lab Results:  Results for orders placed or performed during the hospital encounter of 11/30/19 (from the past 48 hour(s))  Comprehensive metabolic panel     Status: Abnormal   Collection Time: 11/30/19  8:15 PM  Result Value Ref Range   Sodium 139 135 - 145 mmol/L   Potassium 4.1 3.5 - 5.1 mmol/L   Chloride 104 98 - 111 mmol/L   CO2 21 (L) 22 - 32 mmol/L   Glucose, Bld 103 (H) 70 - 99 mg/dL    Comment: Glucose reference range applies only to samples taken after fasting for at least 8 hours.   BUN 16 6 - 20 mg/dL   Creatinine, Ser 3.64 0.61 - 1.24 mg/dL   Calcium 9.1 8.9 - 68.0 mg/dL   Total Protein 8.6 (H) 6.5 - 8.1 g/dL   Albumin 4.8 3.5 - 5.0 g/dL   AST 37 15 - 41 U/L   ALT 19 0 - 44 U/L    Alkaline Phosphatase 59 38 - 126 U/L   Total Bilirubin 2.7 (H) 0.3 - 1.2  mg/dL   GFR calc non Af Amer >60 >60 mL/min   GFR calc Af Amer >60 >60 mL/min   Anion gap 14 5 - 15    Comment: Performed at [facility], [address] [place], [place] [address]  Ethanol     Status: None   Collection Time: 11/30/19  8:15 PM  Result Value Ref Range   Alcohol, Ethyl (B) <10 <10 mg/dL    Comment: (NOTE) Lowest detectable limit for serum alcohol is 10 mg/dL.  For medical purposes only. Performed at [facility], [address] [place], [place] [address]   Salicylate level     Status: Abnormal   Collection Time: 11/30/19  8:15 PM  Result Value Ref Range   Salicylate Lvl <7.0 (L) 7.0 - 30.0 mg/dL    Comment: Performed at [facility], [address] [place], [place] [address]  Acetaminophen level     Status: Abnormal   Collection Time: 11/30/19  8:15 PM  Result Value Ref Range   Acetaminophen (Tylenol), Serum <10 (L) 10 - 30 ug/mL    Comment: (NOTE) Therapeutic concentrations vary significantly. A range of 10-30 ug/mL  may be an effective concentration for many patients. However, some  are best treated at concentrations outside of this range. Acetaminophen concentrations >150 ug/mL at 4 hours after ingestion  and >50 ug/mL at 12 hours after ingestion are often associated with  toxic reactions.  Performed at [facility], [address] [place], [place] [address]   cbc     Status: Abnormal   Collection Time: 11/30/19  8:15 PM  Result Value Ref Range   WBC 13.8 (H) 4.0 - 10.5 K/uL   RBC 5.06 4.22 - 5.81 MIL/uL   Hemoglobin 14.5 13.0 - 17.0 g/dL   HCT 29.541.9 39 - 52 %   MCV 82.8 80.0 - 100.0 fL   MCH 28.7 26.0 - 34.0 pg   MCHC 34.6 30.0 - 36.0 g/dL   RDW 62.114.1 30.811.5 - 65.715.5 %   Platelets 314 150 - 400 K/uL   nRBC 0.0 0.0 - 0.2 %    Comment: Performed at [facility], [address] [place], [place] [address]  SARS Coronavirus 2 by  RT PCR (hospital order, performed in [facility] hospital lab) Nasopharyngeal Nasopharyngeal Swab     Status: None   Collection Time: 11/30/19  8:36 PM   Specimen: Nasopharyngeal Swab  Result Value Ref Range   SARS Coronavirus 2 NEGATIVE NEGATIVE    Comment: (NOTE) SARS-CoV-2 target nucleic acids are NOT DETECTED.  The SARS-CoV-2 RNA is generally detectable in upper and lower respiratory specimens during the acute phase of infection. The lowest concentration of SARS-CoV-2 viral copies this assay can detect is 250 copies / mL. A negative result does not preclude SARS-CoV-2 infection and should not be used as the sole basis for treatment or other patient management decisions.  A negative result may occur with improper specimen collection / handling, submission of specimen other than nasopharyngeal swab, presence of viral mutation(s) within the areas targeted by this assay, and inadequate number of viral copies (<250 copies / mL). A negative result must be combined with clinical observations, patient history, and epidemiological information.  Fact Sheet for Patients:   BoilerBrush.com.cyhttps://www.fda.gov/media/136312/download  Fact Sheet for Healthcare Providers: https://pope.com/https://www.fda.gov/media/136313/download  This test is not yet approved or  cleared by the [place] FDA and has been authorized for detection and/or diagnosis of SARS-CoV-2 by FDA under an Emergency Use Authorization (  EUA).  This EUA will remain in effect (meaning this test can be used) for the duration of the COVID-19 declaration under Section 564(b)(1) of the Act, 21 U.S.C. section 360bbb-3(b)(1), unless the authorization is terminated or revoked sooner.  Performed at [facility], [address] [place], [place] [address]   Urine Drug Screen, Qualitative     Status: Abnormal   Collection Time: 12/01/19  9:08 AM  Result Value Ref Range   Tricyclic, Ur Screen POSITIVE (A) NONE DETECTED   Amphetamines, Ur Screen  POSITIVE (A) NONE DETECTED   MDMA (Ecstasy)Ur Screen NONE DETECTED NONE DETECTED   Cocaine Metabolite,Ur [place] NONE DETECTED NONE DETECTED   Opiate, Ur Screen NONE DETECTED NONE DETECTED   Phencyclidine (PCP) Ur S NONE DETECTED NONE DETECTED   Cannabinoid 50 Ng, Ur [place] NONE DETECTED NONE DETECTED   Barbiturates, Ur Screen NONE DETECTED NONE DETECTED   Benzodiazepine, Ur Scrn NONE DETECTED NONE DETECTED   Methadone Scn, Ur NONE DETECTED NONE DETECTED    Comment: (NOTE) Tricyclics + metabolites, urine    Cutoff 1000 ng/mL Amphetamines + metabolites, urine  Cutoff 1000 ng/mL MDMA (Ecstasy), urine              Cutoff 500 ng/mL Cocaine Metabolite, urine          Cutoff 300 ng/mL Opiate + metabolites, urine        Cutoff 300 ng/mL Phencyclidine (PCP), urine         Cutoff 25 ng/mL Cannabinoid, urine                 Cutoff 50 ng/mL Barbiturates + metabolites, urine  Cutoff 200 ng/mL Benzodiazepine, urine              Cutoff 200 ng/mL Methadone, urine                   Cutoff 300 ng/mL  The urine drug screen provides only a preliminary, unconfirmed analytical test result and should not be used for non-medical purposes. Clinical consideration and professional judgment should be applied to any positive drug screen result due to possible interfering substances. A more specific alternate chemical method must be used in order to obtain a confirmed analytical result. Gas chromatography / mass spectrometry (GC/MS) is the preferred confirm atory method. Performed at [facility], [address] [place], [place] [address]     Blood Alcohol level:  Lab Results  Component Value Date   [facility] <10 11/30/2019   ETH <10 11/21/2019    Metabolic Disorder Labs: No results found for: HGBA1C, MPG No results found for: PROLACTIN Lab Results  Component Value Date   CHOL 156 07/11/2014   TRIG 178 (A) 07/11/2014   HDL 46 07/11/2014   LDLCALC 74 07/11/2014    Physical Findings: AIMS:  , ,  ,   ,    CIWA:    COWS:     Musculoskeletal: Strength & Muscle Tone: normal  Gait & Station: normal  Patient leans: n/a   Psychiatric Specialty Exam: Physical Exam  Review of Systems  Blood pressure 129/72, pulse (!) 108, temperature 98.3 F (36.8 C), temperature source Oral, resp. rate 18, height 5\' 8"  (1.727 m), weight 99.8 kg, SpO2 99 %.Body mass index is 33.45 kg/m.   Mental Status     Appearance Normal  Consciousness not clouded or fluctuant Concentration and attention normal  Mood and affect normal  Thought process and content normal  Speech normal  Judgement insight reliability fair  to poor Oriented times four Rapport and eye contact fair to poor  Memory remote recent immediate intact Intelligence and fund of knowledge average Movements none akathisia none ADL's normal Cognition normal Assets  Same  Sleep normal  Abstraction, normal  Rapport and eye contact fair                                                       Treatment Plan Summary: Awaits discharge when placement found   Roselind Messier, MD 12/01/2019, 2:07 PM

## 2019-12-02 ENCOUNTER — Emergency Department
Admission: EM | Admit: 2019-12-02 | Discharge: 2019-12-08 | Disposition: A | Discharge status: Other Acute Inpatient Hospital | Payer: Self-pay | Attending: Emergency Medicine | Admitting: Emergency Medicine

## 2019-12-02 ENCOUNTER — Other Ambulatory Visit: Payer: Self-pay

## 2019-12-02 ENCOUNTER — Encounter: Payer: Self-pay | Admitting: Emergency Medicine

## 2019-12-02 DIAGNOSIS — F4322 Adjustment disorder with anxiety: Secondary | ICD-10-CM | POA: Insufficient documentation

## 2019-12-02 DIAGNOSIS — J45909 Unspecified asthma, uncomplicated: Secondary | ICD-10-CM | POA: Diagnosis present

## 2019-12-02 DIAGNOSIS — Z7951 Long term (current) use of inhaled steroids: Secondary | ICD-10-CM | POA: Insufficient documentation

## 2019-12-02 DIAGNOSIS — F1510 Other stimulant abuse, uncomplicated: Secondary | ICD-10-CM | POA: Diagnosis present

## 2019-12-02 DIAGNOSIS — F419 Anxiety disorder, unspecified: Secondary | ICD-10-CM

## 2019-12-02 DIAGNOSIS — Z20822 Contact with and (suspected) exposure to covid-19: Secondary | ICD-10-CM | POA: Insufficient documentation

## 2019-12-02 DIAGNOSIS — F1520 Other stimulant dependence, uncomplicated: Secondary | ICD-10-CM | POA: Diagnosis present

## 2019-12-02 DIAGNOSIS — F333 Major depressive disorder, recurrent, severe with psychotic symptoms: Secondary | ICD-10-CM | POA: Diagnosis present

## 2019-12-02 DIAGNOSIS — Z79899 Other long term (current) drug therapy: Secondary | ICD-10-CM | POA: Insufficient documentation

## 2019-12-02 DIAGNOSIS — F17210 Nicotine dependence, cigarettes, uncomplicated: Secondary | ICD-10-CM | POA: Insufficient documentation

## 2019-12-02 DIAGNOSIS — F323 Major depressive disorder, single episode, severe with psychotic features: Secondary | ICD-10-CM | POA: Diagnosis present

## 2019-12-02 MED ORDER — QUETIAPINE FUMARATE 25 MG PO TABS
100.0000 mg | ORAL_TABLET | Freq: Every day | ORAL | Status: DC
  Administered 2019-12-02 – 2019-12-07 (×6): 100 mg via ORAL
  Filled 2019-12-02 (×6): qty 4

## 2019-12-02 MED ORDER — HYDROXYZINE HCL 25 MG PO TABS
25.0000 mg | ORAL_TABLET | Freq: Four times a day (QID) | ORAL | Status: DC | PRN
  Administered 2019-12-02 – 2019-12-07 (×3): 25 mg via ORAL
  Filled 2019-12-02 (×5): qty 1

## 2019-12-02 MED ORDER — CITALOPRAM HYDROBROMIDE 20 MG PO TABS
20.0000 mg | ORAL_TABLET | Freq: Every day | ORAL | Status: DC
  Administered 2019-12-03 – 2019-12-08 (×6): 20 mg via ORAL
  Filled 2019-12-02 (×6): qty 1

## 2019-12-02 MED ORDER — DIVALPROEX SODIUM 500 MG PO DR TAB
500.0000 mg | DELAYED_RELEASE_TABLET | Freq: Two times a day (BID) | ORAL | Status: DC
  Administered 2019-12-02 – 2019-12-08 (×12): 500 mg via ORAL
  Filled 2019-12-02 (×12): qty 1

## 2019-12-02 NOTE — ED Provider Notes (Signed)
[facility] Emergency Department Provider Note   ____________________________________________   First MD Initiated Contact with Patient 12/02/19 2002     (approximate)  I have reviewed the triage vital signs and the nursing notes.   HISTORY  Chief Complaint No chief complaint on file.  Chief complaint is suicidal ideation  HPI Demontray Franta Baade is a 29 y.o. male patient seen today and discharged today.  He is a drug user who is afraid if he goes back out on the street he will start using again.  He says he still is having some suicidal  thoughts as well.  He does not think he is ready to go back out on the streets.  He denies any other problems.         Past Medical History:  Diagnosis Date  . Anxiety   . Asthma   . Bipolar 1 disorder ([name])   . Depression   . Methamphetamine abuse [facility]     Patient Active Problem List   Diagnosis Date Noted  . 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 MDD (major depressive disorder), recurrent, severe, with psychosis ([name]) 09/14/2019  . MDD (major depressive disorder), single episode, severe with psychosis ([name]) 09/14/2019  . Asthma 06/26/2014    Past Surgical History:  Procedure Laterality Date  . CLOSED REDUCTION MANDIBLE N/A 07/02/2018   Procedure: CLOSED REDUCTION MANDIBULAR;  Surgeon: Vernie Murders, MD;  Location: [facility] ORS;  Service: ENT;  Laterality: N/A;    Prior to Admission medications   Medication Sig Start Date End Date Taking? Authorizing Provider  acetaminophen (TYLENOL) 500 MG tablet Take 500 mg by mouth every 6 (six) hours as needed for moderate pain.    [provider]  albuterol (VENTOLIN HFA) 108 (90 Base) MCG/ACT inhaler Inhale 2 puffs into the lungs every 6 (six) hours as needed for wheezing or shortness of breath. 10/17/19   Clapacs, Jackquline Denmark, MD  cetirizine (ZYRTEC) 10 MG tablet Take 1 tablet by mouth daily.  03/01/19   [provider]  citalopram (CELEXA) 20 MG tablet Take 1 tablet (20 mg total) by mouth daily. 10/18/19   Clapacs, Jackquline Denmark, MD  divalproex (DEPAKOTE) 500 MG DR tablet Take 1 tablet (500 mg total) by mouth every 12 (twelve) hours. 10/17/19   Clapacs, Jackquline Denmark, MD  fluticasone furoate-vilanterol (BREO ELLIPTA) 200-25 MCG/INH AEPB Inhale 1 puff into the lungs daily. 10/18/19   Clapacs, Jackquline Denmark, MD  hydrOXYzine (ATARAX/VISTARIL) 25 MG tablet Take 1 tablet (25 mg total) by mouth every 6 (six) hours as needed for anxiety. 10/17/19   Clapacs, Jackquline Denmark, MD  Multiple Vitamin (MULTIVITAMIN WITH MINERALS) TABS tablet Take 1 tablet by mouth daily.    [provider]  QUEtiapine (SEROQUEL) 100 MG tablet Take 1 tablet (100 mg total) by mouth at bedtime. 10/17/19   Clapacs, Jackquline Denmark, MD    Allergies Ibuprofen  Family History  Problem Relation Age of Onset  . Asthma Mother   . Asthma Brother     Social History Social History   Tobacco Use  . Smoking status: Current Every Day Smoker    Packs/day: 1.00    Types: Cigarettes  . Smokeless tobacco: Never Used  . Tobacco comment: started smoking again December 2019  Substance Use Topics  . Alcohol use: Not Currently    Alcohol/week: 7.0 standard drinks    Types: 7 Standard drinks or equivalent per week  . Drug  use: Yes    Types: Methamphetamines    Comment: last use was 2 days ago    Review of Systems Constitutional: No fever/chills Eyes: No visual changes. ENT: No sore throat. Cardiovascular: Denies chest pain. Respiratory: Denies shortness of breath. Gastrointestinal: No abdominal pain.  No nausea, no vomiting.  No diarrhea.  No constipation. Genitourinary: Negative for dysuria. Musculoskeletal: Negative for back pain. Skin: Negative for rash. Neurological: Negative for headaches, focal weakness  ____________________________________________   PHYSICAL EXAM:  VITAL SIGNS: ED Triage Vitals  Enc Vitals Group     BP  12/02/19 1851 104/68     Pulse Rate 12/02/19 1851 64     Resp 12/02/19 1851 18     Temp 12/02/19 1851 98.1 F (36.7 C)     Temp Source 12/02/19 1851 Oral     SpO2 12/02/19 1851 99 %     Weight --      Height --      Head Circumference --      Peak Flow --      Pain Score 12/02/19 1855 0     Pain Loc --      Pain Edu? --      Excl. in GC? --     Constitutional: Alert and oriented. Well appearing and in no acute distress. Eyes: Conjunctivae are normal.  PERRL. Head: Atraumatic. Nose: No congestion/rhinnorhea. Mouth/Throat: Mucous membranes are moist.  Oropharynx non-erythematous. Neck: No stridor. Cardiovascular: Normal rate, regular rhythm. Grossly normal heart sounds.  Good peripheral circulation. Respiratory: Normal respiratory effort.  No retractions. Lungs CTAB. Gastrointestinal: Soft and nontender. No distention. No abdominal bruits.  Musculoskeletal: No lower extremity tenderness nor edema.  Neurologic:  Normal speech and language. No gross focal neurologic deficits are appreciated. No gait instability. Skin:  Skin is warm, dry and intact. No rash noted.  ____________________________________________   LABS (all labs ordered are listed, but only abnormal results are displayed)  Labs Reviewed - No data to display ____________________________________________  EKG   ____________________________________________  RADIOLOGY  ED MD interpretation:   Official radiology report(s): No results found.  ____________________________________________   PROCEDURES  Procedure(s) performed (including Critical Care):  Procedures   ____________________________________________   INITIAL IMPRESSION / ASSESSMENT AND PLAN / ED COURSE We will get psychiatry to see him again in the meantime we will let him sit here probably overnight.  He knows that we do not have any beds to admit psych patients to here tonight.               ____________________________________________   FINAL CLINICAL IMPRESSION(S) / ED DIAGNOSES  Final diagnoses:  Anxious mood     ED Discharge Orders    None       Note:  This document was prepared using Dragon voice recognition software and may include unintentional dictation errors.    Arnaldo Natal, MD 12/02/19 2106

## 2019-12-02 NOTE — Consult Note (Signed)
[facility] Face-to-Face Psychiatry Consult   Reason for Consult: Mental Health Problem Referring Physician: Dr. Manson Passey Patient Identification: Nathan Koch MRN:  [id number] Principal Diagnosis: <principal problem not specified> Diagnosis:  Active Problems:   Asthma   Methamphetamine use ([name])   MDD (major depressive disorder), recurrent, severe, with psychosis ([name])   MDD (major depressive disorder), single episode, severe with psychosis ([name])   Depression, major, recurrent, severe with psychosis ([name])   Total Time spent with patient: 30 minutes  Subjective: " I have been in rehab for 2 months I went to court and they will not accept me back." Nathan Koch is a 29 y.o. male patient presented to [facility] ED via POV voluntary. Per the ED triage nurse note, the patient just discharged states that he does not feel stable to be D/C. Pt states that he is not ready to leave yet and is still depressed. This provider saw the patient face-to-face; the chart was reviewed and consulted with Dr. Darnelle Catalan on 12/02/2019 due to the patient's care. It was discussed with the EDP that the patient would remain under observation overnight and will be reassessed in the a.m. to determine if he meets the criteria for psychiatric inpatient admission; he could be discharged back home. The patient is alert and oriented x 4, calm, cooperative, and mood-congruent with effect on evaluation.  The patient is  Homeless and has nowhere to go. He is asking to be admitted to the inpatient unit. The patient does not appear to be responding to internal and external stimuli.  The patient is not presenting with any delusional thinking. The patient denies auditory and visual hallucinations. The patient admits to suicidal ideation with a plan, but he is not disclosing it to this Clinical research associate. He voiced that he has worked extremely hard to maintain his sobriety. The patient is not presenting with any psychotic or paranoid behaviors. During an  encounter with the patient, he was able to answer questions appropriately.  Plan: The patient remained under observation overnight and will be reassessed in the a.m. to determine if he meets the criteria for psychiatric inpatient admission; he could be discharged back home.  HPI: Per Dr. Darnelle Catalan: Nathan Koch is a 29 y.o. male patient seen today and discharged today.  He is a drug user who is afraid if he goes back out on the street he will start using again.  He says he still is having some suicidal  thoughts as well.  He does not think he is ready to go back out on the streets.  He denies any other problems.    Past Psychiatric History:  Anxiety Depression Methamphetamine abuse ([name])  Risk to Self: Suicidal Ideation: Yes-Currently Present Suicidal Intent: Yes-Currently Present Is patient at risk for suicide?: Yes Suicidal Plan?: No What has been your use of drugs/alcohol within the last 12 months?: Methamphetamines How many times?: 2 Other Self Harm Risks: None Triggers for Past Attempts: None known Intentional Self Injurious Behavior: None No Risk to Others: Homicidal Ideation: No Thoughts of Harm to Others: No Current Homicidal Intent: No Current Homicidal Plan: No Access to Homicidal Means: No Identified Victim: None History of harm to others?: No Assessment of Violence: None Noted Violent Behavior Description: None Does patient have access to weapons?: No Criminal Charges Pending?: No Does patient have a court date: No No Prior Inpatient Therapy: Prior Inpatient Therapy: Yes Prior Therapy Dates: 09/2019, 08/2019 & 03/2019 Prior Therapy Facilty/Provider(s): [facility] & [facility] Reason for Treatment: Substance  Abuse, Depression Yes Prior Outpatient Therapy: Prior Outpatient Therapy: Yes Prior Therapy Dates: Current Prior Therapy Facilty/Provider(s): [facility] Reason for Treatment: Depression and substance abuse Does patient have an ACCT team?: No Does patient have  Intensive In-House Services?  : No Does patient have [facility] services? : No Does patient have P4CC services?: No Yes  Past Medical History:  Past Medical History:  Diagnosis Date  . Anxiety   . Asthma   . Bipolar 1 disorder ([name])   . Depression   . Methamphetamine abuse [facility]([name])     Past Surgical History:  Procedure Laterality Date  . CLOSED REDUCTION MANDIBLE N/A 07/02/2018   Procedure: CLOSED REDUCTION MANDIBULAR;  Surgeon: Vernie MurdersJuengel, Paul, MD;  Location: [facility] ORS;  Service: ENT;  Laterality: N/A;   Family History:  Family History  Problem Relation Age of Onset  . Asthma Mother   . Asthma Brother    Family Psychiatric  History: Unknown Social History:  Social History   Substance and Sexual Activity  Alcohol Use Not Currently  . Alcohol/week: 7.0 standard drinks  . Types: 7 Standard drinks or equivalent per week     Social History   Substance and Sexual Activity  Drug Use Yes  . Types: Methamphetamines   Comment: last use was 2 days ago    Social History   Socioeconomic History  . Marital status: Single    Spouse name: Not on file  . Number of children: Not on file  . Years of education: Not on file  . Highest education level: Not on file  Occupational History  . Not on file  Tobacco Use  . Smoking status: Current Every Day Smoker    Packs/day: 1.00    Types: Cigarettes  . Smokeless tobacco: Never Used  . Tobacco comment: started smoking again December 2019  Substance and Sexual Activity  . Alcohol use: Not Currently    Alcohol/week: 7.0 standard drinks    Types: 7 Standard drinks or equivalent per week  . Drug use: Yes    Types: Methamphetamines    Comment: last use was 2 days ago  . Sexual activity: Not on file  Other Topics Concern  . Not on file  Social History Narrative  . Not on file   Social Determinants of Health   Financial Resource Strain:   . Difficulty of Paying Living Expenses:   Food Insecurity:   . Worried About Programme researcher, broadcasting/film/videounning Out of Food in  the Last Year:   . Baristaan Out of Food in the Last Year:   Transportation Needs:   . Freight forwarderLack of Transportation (Medical):   Marland Kitchen. Lack of Transportation (Non-Medical):   Physical Activity:   . Days of Exercise per Week:   . Minutes of Exercise per Session:   Stress:   . Feeling of Stress :   Social Connections:   . Frequency of Communication with Friends and Family:   . Frequency of Social Gatherings with Friends and Family:   . Attends Religious Services:   . Active Member of Clubs or Organizations:   . Attends BankerClub or Organization Meetings:   Marland Kitchen. Marital Status:    Additional Social History:    Allergies:   Allergies  Allergen Reactions  . Ibuprofen Swelling    Labs:  Results for orders placed or performed during the hospital encounter of 11/30/19 (from the past 48 hour(s))  Urine Drug Screen, Qualitative     Status: Abnormal   Collection Time: 12/01/19  9:08 AM  Result Value Ref Range  Tricyclic, Ur Screen POSITIVE (A) NONE DETECTED   Amphetamines, Ur Screen POSITIVE (A) NONE DETECTED   MDMA (Ecstasy)Ur Screen NONE DETECTED NONE DETECTED   Cocaine Metabolite,Ur [place] NONE DETECTED NONE DETECTED   Opiate, Ur Screen NONE DETECTED NONE DETECTED   Phencyclidine (PCP) Ur S NONE DETECTED NONE DETECTED   Cannabinoid 50 Ng, Ur [place] NONE DETECTED NONE DETECTED   Barbiturates, Ur Screen NONE DETECTED NONE DETECTED   Benzodiazepine, Ur Scrn NONE DETECTED NONE DETECTED   Methadone Scn, Ur NONE DETECTED NONE DETECTED    Comment: (NOTE) Tricyclics + metabolites, urine    Cutoff 1000 ng/mL Amphetamines + metabolites, urine  Cutoff 1000 ng/mL MDMA (Ecstasy), urine              Cutoff 500 ng/mL Cocaine Metabolite, urine          Cutoff 300 ng/mL Opiate + metabolites, urine        Cutoff 300 ng/mL Phencyclidine (PCP), urine         Cutoff 25 ng/mL Cannabinoid, urine                 Cutoff 50 ng/mL Barbiturates + metabolites, urine  Cutoff 200 ng/mL Benzodiazepine, urine              Cutoff 200  ng/mL Methadone, urine                   Cutoff 300 ng/mL  The urine drug screen provides only a preliminary, unconfirmed analytical test result and should not be used for non-medical purposes. Clinical consideration and professional judgment should be applied to any positive drug screen result due to possible interfering substances. A more specific alternate chemical method must be used in order to obtain a confirmed analytical result. Gas chromatography / mass spectrometry (GC/MS) is the preferred confirm atory method. Performed at [facility], [address] [place], [place] [address]     Current Facility-Administered Medications  Medication Dose Route Frequency Provider Last Rate Last Admin  . [START ON 12/03/2019] citalopram (CELEXA) tablet 20 mg  20 mg Oral Daily Arnaldo Natal, MD      . divalproex (DEPAKOTE) DR tablet 500 mg  500 mg Oral Q12H Arnaldo Natal, MD   500 mg at 12/02/19 2234  . hydrOXYzine (ATARAX/VISTARIL) tablet 25 mg  25 mg Oral Q6H PRN Arnaldo Natal, MD   25 mg at 12/02/19 2234  . QUEtiapine (SEROQUEL) tablet 100 mg  100 mg Oral QHS Arnaldo Natal, MD   100 mg at 12/02/19 2234   Current Outpatient Medications  Medication Sig Dispense Refill  . acetaminophen (TYLENOL) 500 MG tablet Take 500 mg by mouth every 6 (six) hours as needed for moderate pain.    Marland Kitchen albuterol (VENTOLIN HFA) 108 (90 Base) MCG/ACT inhaler Inhale 2 puffs into the lungs every 6 (six) hours as needed for wheezing or shortness of breath. 18 g 0  . cetirizine (ZYRTEC) 10 MG tablet Take 1 tablet by mouth daily.    . citalopram (CELEXA) 20 MG tablet Take 1 tablet (20 mg total) by mouth daily. 30 tablet 0  . divalproex (DEPAKOTE) 500 MG DR tablet Take 1 tablet (500 mg total) by mouth every 12 (twelve) hours. 60 tablet 0  . fluticasone furoate-vilanterol (BREO ELLIPTA) 200-25 MCG/INH AEPB Inhale 1 puff into the lungs daily. 30 each 0  . hydrOXYzine (ATARAX/VISTARIL) 25 MG tablet Take 1  tablet (25 mg total) by mouth every 6 (six) hours as  needed for anxiety. 30 tablet 0  . Multiple Vitamin (MULTIVITAMIN WITH MINERALS) TABS tablet Take 1 tablet by mouth daily.    . QUEtiapine (SEROQUEL) 100 MG tablet Take 1 tablet (100 mg total) by mouth at bedtime. 30 tablet 0    Musculoskeletal: Strength & Muscle Tone: within normal limits Gait & Station: normal Patient leans: N/A  Psychiatric Specialty Exam: Physical Exam Vitals and nursing note reviewed.  Pulmonary:     Effort: Pulmonary effort is normal.  Musculoskeletal:        General: Normal range of motion.     Cervical back: Normal range of motion and neck supple.  Neurological:     Mental Status: He is alert and oriented to person, place, and time.  Psychiatric:        Attention and Perception: Attention and perception normal.        Mood and Affect: Mood and affect normal.        Speech: Speech normal.        Behavior: Behavior normal. Behavior is cooperative.        Thought Content: Thought content includes suicidal ideation.        Cognition and Memory: Cognition normal.        Judgment: Judgment is inappropriate.     Review of Systems  Psychiatric/Behavioral: Positive for suicidal ideas. The patient is nervous/anxious.   All other systems reviewed and are negative.   Blood pressure 104/68, pulse 64, temperature 98.1 F (36.7 C), temperature source Oral, resp. rate 18, SpO2 99 %.There is no height or weight on file to calculate BMI.  General Appearance: Casual  Eye Contact:  Good  Speech:  Clear and Coherent  Volume:  Normal  Mood:  NA  Affect:  Appropriate, Congruent and Flat  Thought Process:  Coherent  Orientation:  Full (Time, Place, and Person)  Thought Content:  Logical  Suicidal Thoughts:  Yes.  without intent/plan  Homicidal Thoughts:  No  Memory:  Immediate;   Good Recent;   Fair Remote;   Fair  Judgement:  Good  Insight:  Fair  Psychomotor Activity:  Increased  Concentration:   Concentration: Good and Attention Span: Good  Recall:  Fair  Fund of Knowledge:  Good  Language:  Good  Akathisia:  Negative  Handed:  Right  AIMS (if indicated):     Assets:  Communication Skills Desire for Improvement Financial Resources/Insurance Housing Resilience Social Support  ADL's:  Intact  Cognition:  WNL  Sleep:    Insomnia     Treatment Plan Summary: Plan The patient remained under observation overnight and will be reassessed in the a.m. to determine if he meets the criteria for psychiatric inpatient admission; he could be discharged back home.  Disposition: Supportive therapy provided about ongoing stressors. The patient remained under observation overnight and will be reassessed in the a.m. to determine if he meets the criteria for psychiatric inpatient admission; he could be discharged back home.  Gillermo Murdoch, NP 12/02/2019 11:26 PM

## 2019-12-02 NOTE — ED Notes (Signed)
RN gave pt his clothing and discharge papers. Pt angry. "I can't believe this shit. I guess Ill have to go wander the streets of [place].  Ill be right back to check in."

## 2019-12-02 NOTE — BH Assessment (Signed)
Assessment Note  Nathan Koch is an 29 y.o. male presenting back to [facility] ED less than 24 hours voluntarily for SI. Per triage note Pt just discharged, states that he does not feel like he is stable to be D/C. Pt states that he is not ready to leave yet and is still depressed. Patient visited this ED on 11/30/19 and was discharged on 12/02/19, patient returned back within 30 minutes to the waiting room to report that he does not feel stable. During assessment patient appears alert and oriented x4, calm and cooperative and pleasant. Patient reported why he had returned to the ED "I'm still suicidal and depressed, it has a lot to do with me and my old lady breaking up, I have no where to go, and I was 60 days clean. Patient denies any substance use today. Patient continues to report SI, he denies HI/AH/VH and does not appear to be responding to any internal or external stimuli.   Per Psyc NP Elenore Paddy patient will be observed overnight and reassessed in the morning.  Diagnosis: Depression  Past Medical History:  Past Medical History:  Diagnosis Date  . Anxiety   . Asthma   . Bipolar 1 disorder ([name])   . Depression   . Methamphetamine abuse [facility])     Past Surgical History:  Procedure Laterality Date  . CLOSED REDUCTION MANDIBLE N/A 07/02/2018   Procedure: CLOSED REDUCTION MANDIBULAR;  Surgeon: Vernie Murders, MD;  Location: [facility] ORS;  Service: ENT;  Laterality: N/A;    Family History:  Family History  Problem Relation Age of Onset  . Asthma Mother   . Asthma Brother     Social History:  reports that he has been smoking cigarettes. He has been smoking about 1.00 pack per day. He has never used smokeless tobacco. He reports previous alcohol use of about 7.0 standard drinks of alcohol per week. He reports current drug use. Drug: Methamphetamines.  Additional Social History:  Alcohol / Drug Use Pain Medications: See MAR Prescriptions: See MAR Over the Counter: See MAR History of  alcohol / drug use?: Yes Substance #1 Name of Substance 1: Methamphetamine  CIWA: CIWA-Ar BP: 104/68 Pulse Rate: 64 COWS:    Allergies:  Allergies  Allergen Reactions  . Ibuprofen Swelling    Home Medications: (Not in a hospital admission)   OB/GYN Status:  No LMP for male patient.  General Assessment Data Location of Assessment: [facility] ED TTS Assessment: In system Is this a Tele or Face-to-Face Assessment?: Face-to-Face Is this an Initial Assessment or a Re-assessment for this encounter?: Initial Assessment Patient Accompanied by:: N/A Language Other than English: No Living Arrangements: Homeless/Shelter What gender do you identify as?: Male Marital status: Single Living Arrangements: Other (Comment) Can pt return to current living arrangement?: Yes Admission Status: Voluntary Is patient capable of signing voluntary admission?: Yes Referral Source: Self/Family/Friend Insurance type: None  Medical Screening Exam [facility] Walk-in ONLY) Medical Exam completed: Yes  Crisis Care Plan Living Arrangements: Other (Comment) Legal Guardian: Other: (Self) Name of Psychiatrist: [facility] Name of Therapist: [facility]  Education Status Is patient currently in school?: No Is the patient employed, unemployed or receiving disability?: Unemployed  Risk to self with the past 6 months Suicidal Ideation: Yes-Currently Present Has patient been a risk to self within the past 6 months prior to admission? : Yes Suicidal Intent: Yes-Currently Present Has patient had any suicidal intent within the past 6 months prior to admission? : Yes Is patient at risk for suicide?:  Yes Suicidal Plan?: No Has patient had any suicidal plan within the past 6 months prior to admission? : No What has been your use of drugs/alcohol within the last 12 months?: Methamphetamines Previous Attempts/Gestures: Yes How many times?: 2 Other Self Harm Risks: None Triggers for Past Attempts: None known Intentional Self  Injurious Behavior: None Family Suicide History: Unknown Recent stressful life event(s): Other (Comment) (Currently homeless) Persecutory voices/beliefs?: No Depression: Yes Depression Symptoms: Isolating, Loss of interest in usual pleasures, Feeling worthless/self pity Substance abuse history and/or treatment for substance abuse?: Yes Suicide prevention information given to non-admitted patients: Not applicable  Risk to Others within the past 6 months Homicidal Ideation: No Does patient have any lifetime risk of violence toward others beyond the six months prior to admission? : No Thoughts of Harm to Others: No Current Homicidal Intent: No Current Homicidal Plan: No Access to Homicidal Means: No Identified Victim: None History of harm to others?: No Assessment of Violence: None Noted Violent Behavior Description: None Does patient have access to weapons?: No Criminal Charges Pending?: No Does patient have a court date: No Is patient on probation?: No  Psychosis Hallucinations: None noted Delusions: None noted  Mental Status Report Appearance/Hygiene: In scrubs Eye Contact: Good Motor Activity: Freedom of movement Speech: Logical/coherent Level of Consciousness: Alert Mood: Pleasant Affect: Appropriate to circumstance Anxiety Level: Minimal Thought Processes: Coherent Judgement: Unimpaired Orientation: Person, Place, Time, Situation, Appropriate for developmental age Obsessive Compulsive Thoughts/Behaviors: None  Cognitive Functioning Concentration: Normal Memory: Recent Intact, Remote Intact Is patient IDD: No Insight: Fair Impulse Control: Poor Appetite: Fair Have you had any weight changes? : No Change Sleep: No Change Total Hours of Sleep: 6 Vegetative Symptoms: None  ADLScreening [facility] Assessment Services) Patient's cognitive ability adequate to safely complete daily activities?: Yes Patient able to express need for assistance with ADLs?:  Yes Independently performs ADLs?: Yes (appropriate for developmental age)  Prior Inpatient Therapy Prior Inpatient Therapy: Yes Prior Therapy Dates: 09/2019, 08/2019 & 03/2019 Prior Therapy Facilty/Provider(s): [facility] & [facility] Reason for Treatment: Substance Abuse, Depression  Prior Outpatient Therapy Prior Outpatient Therapy: Yes Prior Therapy Dates: Current Prior Therapy Facilty/Provider(s): [facility] Reason for Treatment: Depression and substance abuse Does patient have an ACCT team?: No Does patient have Intensive In-House Services?  : No Does patient have [facility] services? : No Does patient have P4CC services?: No  ADL Screening (condition at time of admission) Patient's cognitive ability adequate to safely complete daily activities?: Yes Is the patient deaf or have difficulty hearing?: No Does the patient have difficulty seeing, even when wearing glasses/contacts?: No Does the patient have difficulty concentrating, remembering, or making decisions?: No Patient able to express need for assistance with ADLs?: Yes Does the patient have difficulty dressing or bathing?: No Independently performs ADLs?: Yes (appropriate for developmental age) Does the patient have difficulty walking or climbing stairs?: No Weakness of Legs: None Weakness of Arms/Hands: None  Home Assistive Devices/Equipment Home Assistive Devices/Equipment: None  Therapy Consults (therapy consults require a physician order) PT Evaluation Needed: No OT Evalulation Needed: No SLP Evaluation Needed: No Abuse/Neglect Assessment (Assessment to be complete while patient is alone) Abuse/Neglect Assessment Can Be Completed: Yes Physical Abuse: Denies Verbal Abuse: Denies Sexual Abuse: Denies Exploitation of patient/patient's resources: Denies Self-Neglect: Denies Values / Beliefs Cultural Requests During Hospitalization: None Spiritual Requests During Hospitalization: None Consults Spiritual Care Consult  Needed: No Transition of Care Team Consult Needed: No Advance Directives (For Healthcare) Does Patient Have a Medical Advance Directive?:  No Would patient like information on creating a medical advance directive?: No - Patient declined          Disposition: Per Psyc NP Elenore Paddy patient will be observed overnight and reassessed in the morning. Disposition Initial Assessment Completed for this Encounter: Yes  On Site Evaluation by:   Reviewed with Physician:    Benay Pike MS LCASA 12/02/2019 10:42 PM

## 2019-12-02 NOTE — ED Triage Notes (Signed)
Pt just discharged, states that he does not feel like he is stable to be D/C. Pt states that he is not ready to leave yet and is still depressed. Pt is in NAD.

## 2019-12-02 NOTE — ED Notes (Signed)
VS checked and shower has been offered. No other needs found a this moment.

## 2019-12-02 NOTE — ED Notes (Signed)
Pt discharged to lobby. VS stable. Pt refused to take discharge papers.  All belongings returned to patient.  Pt denies SI.

## 2019-12-02 NOTE — ED Notes (Signed)
lunch tray given. 

## 2019-12-02 NOTE — ED Notes (Addendum)
Pt ambu to toilet att  NAD noted

## 2019-12-02 NOTE — ED Notes (Signed)
Pt. Was given a snack and drink

## 2019-12-02 NOTE — Final Progress Note (Signed)
Physician Final Progress Note  Patient ID: Nathan Koch MRN: [id number] DOB/AGE: [birth year] 29 y.o.  Admit date: 11/30/2019 Admitting provider: No admitting provider for patient encounter. Discharge date: 12/02/2019   IVC rescinded today    Admission Diagnoses:   Meth dependence, post intoxication  Marital discord   Discharge Diagnoses: Same   Consults: TTS / PSYCH MD ER MD   Significant Findings/ Diagnostic Studies:  None    Patient came back on IVC after a one day discharge from the [facility] and relapsed on Meth amphetamines after arguing with his wife during a move in.  Prior he was with [place] several days waiting for a 1/2 way house.    However this time he is being discharged home because he does not meet inpatient criteria  Patient is complex but he seems to want to stay at [facility] in stead of facing home or shelter    Last time here --he wanted to go soon and not wait for one of the houses ---but then left after something did not work there and wanted to be home.  Now that he relapsed t he acute episode is over and he has no active SI HI or plans ---he claims some vague symptoms only after realizing he cannot stay and cannot be transferred to inpatient --he seems to be saying this more to buy time to avoid going home.    MS   Alert strange and odd at baseline Rapport fair  Eye contact okay Mood depressed affect somewhat flat Not anxious Consciousness not clouded or fluctuant Concentration and attention normal No side effects or withdrawal signs  No active SI HI or actual SI plans Thought process and content --double binds, wants things both ways ---did not want to stay long last time waiting for actual 1/2 way house  "I do not want to stay that long and do not need to stay waiting for a placement"---but now that he has to go home is saying " well I  Now need more time"--- Memory remote recent and immediate okay Recall okay  ADL's normal Movements okay   No  akathisia Cognition impaired when intoxicated Sleep normal  Judgement insight reliability fair to poor including intelligence and fund of knowledge Aims not done Assets --not clear  Liabilities --addiction issues Speech normal rate volume tone fluency  No shakes and tremors           Procedures: none   Discharge Condition: {stable   Disposition:   Home to family and wife or shelter that he can wait for 1/2 way house.   Diet:  Regular   Discharge Activity:   Rehab --12 step day treatment inpatient [facility] via intake ---mens support groups ---     Total time spent taking care of this patient:  25-30 minutes  Signed: Roselind Messier 12/02/2019, 2:37 PM

## 2019-12-02 NOTE — ED Notes (Signed)
Pt asking to speak with the psychiatrist.

## 2019-12-02 NOTE — ED Notes (Signed)
Pt requesting "night time meds", no orders noted from triage; pt reports from [place] but is currently homeless, reports "I still have the same thing as yesterday", pt elaborates: withdrawal from smoking "meth", and feeling depressed and suicidal  Pt denies active feeling of SI or plan; pt denies HI or hallucinations

## 2019-12-02 NOTE — ED Notes (Signed)
Pt states he is still depressed and not ready to leave. "If they discharge me, I will come right back."

## 2019-12-02 NOTE — ED Notes (Signed)
Breakfast tray given. °

## 2019-12-02 NOTE — ED Provider Notes (Signed)
Emergency Medicine Observation Re-evaluation Note  Nathan Koch is a 29 y.o. male, seen on rounds today.  Pt initially presented to the ED for complaints of Suicidal Currently, the patient is currently awaiting placement after being cleared by pysch.  Resting comfortably with no acute complaints on my assessment..  Physical Exam  BP 125/60   Pulse 76   Temp 97.9 F (36.6 C) (Oral)   Resp 16   Ht 5\' 8"  (1.727 m)   Wt 99.8 kg   SpO2 100%   BMI 33.45 kg/m  Physical Exam Vitals and nursing note reviewed.  HENT:     Head: Normocephalic and atraumatic.     Right Ear: External ear normal.     Left Ear: External ear normal.  Pulmonary:     Effort: No respiratory distress.  Neurological:     Mental Status: He is alert.  Psychiatric:        Mood and Affect: Mood normal.     ED Course / MDM  EKG:    I have reviewed the labs performed to date as well as medications administered while in observation.  Recent changes in the last 24 hours include none. Plan  Current plan is for awaiting placement. Patient is under full IVC at this time.   , MD 12/02/19 [phone]

## 2019-12-02 NOTE — ED Notes (Signed)
Pt threw discharge papers in dayroom and refused to sign for discharge.

## 2019-12-02 NOTE — ED Notes (Signed)
RN informed patient he was up for discharge.  Pt asking about transportation to [place].  TTS asked this writer to reach out to social work about transportation options.

## 2019-12-03 NOTE — Consult Note (Signed)
[name] Face-to-Face Psychiatry Consult   Reason for Consult:  Came back to ER 79  Min after discharge y esterday  Referring Physician:   ED MD  Patient Identification: Nathan Koch MRN:  [id number] Principal Diagnosis:   Bipolar disorder mixed Meth dependence Adjustment disorder Marital discord Personality Disorder NOS   Diagnosis:  Active Problems:   Asthma   Methamphetamine use ([name])   MDD (major depressive disorder), recurrent, severe, with psychosis ([name])   MDD (major depressive disorder), single episode, severe with psychosis ([name])   Depression, major, recurrent, severe with psychosis ([name])  Now homeless    Total Time spent with patient: 20 -30   Subjective:   Nathan Koch is a 29 y.o. male patient admitted with  Same issues.  Revolving door, discharged yesterday back home but now is homeless after he cannot go back post arguments with wife   Was in [place] prior after relapsing from amphetamine --and arguments with wife   We had asked him to stay at that time, but he insisted on leaving and did not want to wait for bed at a treatment facility ---indirect sabotage.  Released but then above happened  Yesterday wanted to stay but has secondary gain--wants to buy time here and in ER and BHU in order to avoid shelter and its issues.   He says he is actively suicidal but only says this when he kn ows he has to be discharged   However we erred on the side of caution and he is back in [facility] awaiting a re        HPI:  As above no new changes, he is in the same state with the same meds   Voluntary status   Past Psychiatric History:  Already discussed in recent admits   Risk to Self: Suicidal Ideation: Yes-Currently Present Suicidal Intent: Yes-Currently Present Is patient at risk for suicide?: Yes Suicidal Plan?: No What has been your use of drugs/alcohol within the last 12 months?: Methamphetamines How many times?: 2 Other Self Harm Risks: None Triggers for Past  Attempts: None known Intentional Self Injurious Behavior: None Risk to Others: Homicidal Ideation: No Thoughts of Harm to Others: No Current Homicidal Intent: No Current Homicidal Plan: No Access to Homicidal Means: No Identified Victim: None History of harm to others?: No Assessment of Violence: None Noted Violent Behavior Description: None Does patient have access to weapons?: No Criminal Charges Pending?: No Does patient have a court date: No Prior Inpatient Therapy: Prior Inpatient Therapy: Yes Prior Therapy Dates: 09/2019, 08/2019 & 03/2019 Prior Therapy Facilty/Provider(s): [facility] Reason for Treatment: Substance Abuse, Depression Prior Outpatient Therapy: Prior Outpatient Therapy: Yes Prior Therapy Dates: Current Prior Therapy Facilty/Provider(s): [facility] Reason for Treatment: Depression and substance abuse Does patient have an ACCT team?: No Does patient have Intensive In-House Services?  : No Does patient have [facility] services? : No Does patient have P4CC services?: No  Past Medical History:  Past Medical History:  Diagnosis Date  . Anxiety   . Asthma   . Bipolar 1 disorder ([name])   . Depression   . Methamphetamine abuse [facility])     Past Surgical History:  Procedure Laterality Date  . CLOSED REDUCTION MANDIBLE N/A 07/02/2018   Procedure: CLOSED REDUCTION MANDIBULAR;  Surgeon: Vernie Murders, MD;  Location: [facility] ORS;  Service: ENT;  Laterality: N/A;   Family History:  Family History  Problem Relation Age of Onset  . Asthma Mother   . Asthma Brother  Family Psychiatric  History: already discussed in recent admits  Social History:  Social History   Substance and Sexual Activity  Alcohol Use Not Currently  . Alcohol/week: 7.0 standard drinks  . Types: 7 Standard drinks or equivalent per week     Social History   Substance and Sexual Activity  Drug Use Yes  . Types: Methamphetamines   Comment: last use was 2 days ago    Social History    Socioeconomic History  . Marital status: Single    Spouse name: Not on file  . Number of children: Not on file  . Years of education: Not on file  . Highest education level: Not on file  Occupational History  . Not on file  Tobacco Use  . Smoking status: Current Every Day Smoker    Packs/day: 1.00    Types: Cigarettes  . Smokeless tobacco: Never Used  . Tobacco comment: started smoking again December 2019  Substance and Sexual Activity  . Alcohol use: Not Currently    Alcohol/week: 7.0 standard drinks    Types: 7 Standard drinks or equivalent per week  . Drug use: Yes    Types: Methamphetamines    Comment: last use was 2 days ago  . Sexual activity: Not on file  Other Topics Concern  . Not on file  Social History Narrative  . Not on file   Social Determinants of Health   Financial Resource Strain:   . Difficulty of Paying Living Expenses:   Food Insecurity:   . Worried About Programme researcher, broadcasting/film/video in the Last Year:   . Barista in the Last Year:   Transportation Needs:   . Freight forwarder (Medical):   Marland Kitchen Lack of Transportation (Non-Medical):   Physical Activity:   . Days of Exercise per Week:   . Minutes of Exercise per Session:   Stress:   . Feeling of Stress :   Social Connections:   . Frequency of Communication with Friends and Family:   . Frequency of Social Gatherings with Friends and Family:   . Attends Religious Services:   . Active Member of Clubs or Organizations:   . Attends Banker Meetings:   Marland Kitchen Marital Status:    Additional Social History:    Already discussed in recent admits  Technically homeless now, making excuses of SI as secondary gain but due to risk prevention admitted again   Allergies:   Allergies  Allergen Reactions  . Ibuprofen Swelling    Labs: No results found for this or any previous visit (from the past 48 hour(s)).  Current Facility-Administered Medications  Medication Dose Route Frequency  Provider Last Rate Last Admin  . citalopram (CELEXA) tablet 20 mg  20 mg Oral Daily Arnaldo Natal, MD   20 mg at 12/03/19 8841  . divalproex (DEPAKOTE) DR tablet 500 mg  500 mg Oral Q12H Arnaldo Natal, MD   500 mg at 12/03/19 6606  . hydrOXYzine (ATARAX/VISTARIL) tablet 25 mg  25 mg Oral Q6H PRN Arnaldo Natal, MD   25 mg at 12/02/19 2234  . QUEtiapine (SEROQUEL) tablet 100 mg  100 mg Oral QHS Arnaldo Natal, MD   100 mg at 12/02/19 2234   Current Outpatient Medications  Medication Sig Dispense Refill  . acetaminophen (TYLENOL) 500 MG tablet Take 500 mg by mouth every 6 (six) hours as needed for moderate pain.    Marland Kitchen albuterol (VENTOLIN HFA) 108 (90 Base) MCG/ACT inhaler Inhale  2 puffs into the lungs every 6 (six) hours as needed for wheezing or shortness of breath. 18 g 0  . cetirizine (ZYRTEC) 10 MG tablet Take 1 tablet by mouth daily.    . citalopram (CELEXA) 20 MG tablet Take 1 tablet (20 mg total) by mouth daily. 30 tablet 0  . divalproex (DEPAKOTE) 500 MG DR tablet Take 1 tablet (500 mg total) by mouth every 12 (twelve) hours. 60 tablet 0  . fluticasone furoate-vilanterol (BREO ELLIPTA) 200-25 MCG/INH AEPB Inhale 1 puff into the lungs daily. 30 each 0  . hydrOXYzine (ATARAX/VISTARIL) 25 MG tablet Take 1 tablet (25 mg total) by mouth every 6 (six) hours as needed for anxiety. 30 tablet 0  . Multiple Vitamin (MULTIVITAMIN WITH MINERALS) TABS tablet Take 1 tablet by mouth daily.    . QUEtiapine (SEROQUEL) 100 MG tablet Take 1 tablet (100 mg total) by mouth at bedtime. 30 tablet 0    Musculoskeletal: Strength & Muscle Tone: normal  Gait & Station: normal  Patient leans:   Psychiatric Specialty Exam: Physical Exam  Review of Systems  Blood pressure 112/80, pulse 65, temperature (!) 97.5 F (36.4 C), temperature source Oral, resp. rate 18, SpO2 100 %.There is no height or weight on file to calculate BMI.    Mental status exam   Mixed ethnic male dysphoric slightly anxious   Rapport okay eye contact okay Concentration and attention normal  Consciousness not clouded or fluctuant Mood depressed and anxious Affect depressed and anxious Speech normal rate tone volume fluency Memory remote recent and immediate okay Recall Okay Cognition okay  ADL's normal Assets --not clear Liabiltiies --addictive personality  Sleep ---disturbed  Akathisia none movements none tics none Leans --not known  Handedness not known  Fund of knowledge, intelligence, judgement insight reliability all fair to poor  /below average Abstraction normal  SI and HI --says he has ideation but no active plan has secondary gain due to homeless state --story keeps changing based on his discharge status Thought process and content --no psychosis or mania Depressive and victim themes, passive themes unclear if he owns issues                                                         Treatment Plan Summary:   Same plan as before, admitted to [facility] and awaits [facility] or inpatient rehab ---he also needs to make phone calls. Post sabotaging his wait for a facility did not want to wait --after being next on list ----left argued with wife, used Meth, came back then wanted to stay much longer due to homeless state.  SI at his convenience but there is still liability and risk in not keeping him in at this time    ESL in [place]   Not known     TTS working on placement as well.   Disposition: as above   Home meds all restarted   Roselind Messier, MD 12/03/2019 3:03 PM

## 2019-12-03 NOTE — ED Notes (Signed)
Pt given meal tray.

## 2019-12-03 NOTE — BH Assessment (Signed)
Referral information for detox treatment faxed to:   [facility] [phone] or [phone])   [facility] [phone])  .  [facility] [phone])  . [place] [phone])

## 2019-12-03 NOTE — ED Provider Notes (Signed)
Emergency Medicine Observation Re-evaluation Note  Nathan Koch is a 29 y.o. male, seen on rounds today.  Pt initially presented to the ED for complaints of No chief complaint on file. Currently, the patient is calm, resting.  Physical Exam  BP 104/68 (BP Location: Left Arm)   Pulse 64   Temp 98.1 F (36.7 C) (Oral)   Resp 18   SpO2 99%  Physical Exam   GEN: No acute distress, resting comfortably CV: Skin warm, well perfused Lungs: Normal work of breathing Neuro: No focal deficits  ED Course / MDM  EKG:    I have reviewed the labs performed to date as well as medications administered while in observation.  Recent changes in the last 24 hours include none. Plan  Current plan is for psych re-evaluation in the morning Patient is not under full IVC at this time.   Shaune Pollack, MD 12/03/19 [phone]

## 2019-12-03 NOTE — BH Assessment (Addendum)
Late entry- TTS completed reassessment. Pt presented calm, pleasant and oriented x 3.  Pt reports presenting back to [facility] ED for treatment due to SI with a plan to strangle himself and increased anxiety. Pt states "I just have thoughts of wanting to hurt myself". Pt identified recent conflict with his girlfriend, meth addiction and homelessness as the triggers to his current symptoms. Pt denies any current HI/AH/VH and expressed to need treatment for his Meth addiction.   Per Dr. Smith Robert pt is recommended for overnight observation to be reassessed in the morning pending detox referral

## 2019-12-04 NOTE — ED Notes (Signed)
Hourly rounding reveals patient in hall. No complaints, stable, in no acute distress. Q15 minute rounds and monitoring via Security Cameras to continue. 

## 2019-12-04 NOTE — ED Notes (Addendum)
Patient wants to know if there are any open beds in [facility], he wants to move informed him that we have priority patients that will go and then if we can get him over there we will try, patient said ok

## 2019-12-04 NOTE — ED Notes (Signed)
VOL/ PT  MOVED  TO  BHU

## 2019-12-04 NOTE — ED Notes (Signed)
Hourly rounding reveals patient asleep in room. No complaints, stable, in no acute distress. Q15 minute rounds and monitoring via Security Cameras to continue. 

## 2019-12-04 NOTE — ED Notes (Signed)
Patient transferred to room 5 from the quad, Patient is calm and cooperative, no signs of distress.

## 2019-12-04 NOTE — ED Notes (Signed)
No beds in bhu for transfer at this time per wendy rn bhu rn

## 2019-12-04 NOTE — ED Notes (Signed)
Resumed care from ally rn.  Pt resting with covers pulled over head.  Pt calm and cooperative.

## 2019-12-04 NOTE — ED Notes (Signed)
Report to include Situation, Background, Assessment, and Recommendations received from Wendy RN. Patient alert and oriented, warm and dry, in no acute distress. Patient denies SI, HI, AVH and pain. Patient made aware of Q15 minute rounds and security cameras for their safety. Patient instructed to come to me with needs or concerns.  

## 2019-12-04 NOTE — ED Provider Notes (Signed)
Emergency Medicine Observation Re-evaluation Note  Nathan Koch is a 29 y.o. male, seen on rounds today.  Pt initially presented to the ED for detox  Physical Exam  BP (!) 107/55 (BP Location: Right Arm)   Pulse 89   Temp 97.8 F (36.6 C) (Oral)   Resp 18   SpO2 96%  Physical Exam Constitutional: Patient resting comfortably in bed Respiratory: Patient in no respiratory distress Psych: Patient is not agitated ED Course / MDM  EKG:    I have reviewed the labs performed to date as well as medications administered while in observation.   Plan  Patient awaiting placement for detox.   Arnaldo Natal, MD 12/04/19 [phone]

## 2019-12-04 NOTE — ED Notes (Signed)
Report called to wendy rn bhu rn

## 2019-12-04 NOTE — ED Notes (Signed)
Hourly rounding reveals patient awake in room. No complaints, stable, in no acute distress. Q15 minute rounds and monitoring via Security Cameras to continue. 

## 2019-12-04 NOTE — BH Assessment (Signed)
TTS completed reassessment. Pt presented calm, pleasant and oriented x 3.Pt was provided and update regarding being under review with [facility] and the need to complete an intake by phone. Pt completed the intake. Pt continues to endorse SI with a plan to strangle himself but denies any current HI/AV/VH.    TTS received call from [facility]) stating pt does not qualify for services due to 90 day sobriety period and only 1 week of relapse. Shayla reports pt will need to explore OPT first before qualifying for their services.   TTS contacted [facility] to follow up on pt's wait list status. [facility] reports to have no current bed availability for pt and confirmed pt to remain on the wailtist until someone discharges or leaves the home.   Per Dr. Smith Robert pt is recommended for INPT

## 2019-12-05 LAB — CBC WITH DIFFERENTIAL/PLATELET
Abs Immature Granulocytes: 0.01 10*3/uL (ref 0.00–0.07)
Basophils Absolute: 0 10*3/uL (ref 0.0–0.1)
Basophils Relative: 0 %
Eosinophils Absolute: 0.6 10*3/uL — ABNORMAL HIGH (ref 0.0–0.5)
Eosinophils Relative: 8 %
HCT: 41.1 % (ref 39.0–52.0)
Hemoglobin: 13.6 g/dL (ref 13.0–17.0)
Immature Granulocytes: 0 %
Lymphocytes Relative: 23 %
Lymphs Abs: 1.6 10*3/uL (ref 0.7–4.0)
MCH: 28.6 pg (ref 26.0–34.0)
MCHC: 33.1 g/dL (ref 30.0–36.0)
MCV: 86.3 fL (ref 80.0–100.0)
Monocytes Absolute: 0.6 10*3/uL (ref 0.1–1.0)
Monocytes Relative: 9 %
Neutro Abs: 4.3 10*3/uL (ref 1.7–7.7)
Neutrophils Relative %: 60 %
Platelets: 258 10*3/uL (ref 150–400)
RBC: 4.76 MIL/uL (ref 4.22–5.81)
RDW: 13.6 % (ref 11.5–15.5)
WBC: 7.1 10*3/uL (ref 4.0–10.5)
nRBC: 0 % (ref 0.0–0.2)

## 2019-12-05 LAB — BASIC METABOLIC PANEL
Anion gap: 6 (ref 5–15)
BUN: 12 mg/dL (ref 6–20)
CO2: 29 mmol/L (ref 22–32)
Calcium: 8.5 mg/dL — ABNORMAL LOW (ref 8.9–10.3)
Chloride: 103 mmol/L (ref 98–111)
Creatinine, Ser: 0.93 mg/dL (ref 0.61–1.24)
GFR calc Af Amer: >60 mL/min (ref >60)
GFR calc non Af Amer: >60 mL/min (ref >60)
Glucose, Bld: 104 mg/dL — ABNORMAL HIGH (ref 70–99)
Potassium: 4.8 mmol/L (ref 3.5–5.1)
Sodium: 138 mmol/L (ref 135–145)

## 2019-12-05 MED ORDER — ALBUTEROL SULFATE HFA 108 (90 BASE) MCG/ACT IN AERS
2.0000 | INHALATION_SPRAY | Freq: Four times a day (QID) | RESPIRATORY_TRACT | Status: DC | PRN
  Administered 2019-12-05 – 2019-12-08 (×5): 2 via RESPIRATORY_TRACT
  Filled 2019-12-05 (×2): qty 6.7

## 2019-12-05 NOTE — BH Assessment (Signed)
Due to change in Pt's disposition pt was re-faxed out for INPT  Referral information for Psychiatric Hospitalization faxed to:   [facility] ([phone]- [phone]),    [address] [phone]),    [facility] [phone] -or- [phone]),    [facility] [phone] -or- [phone])  [phone]   Nathan Koch [phone] or [phone])   Nathan Koch [phone] or [phone]),

## 2019-12-05 NOTE — ED Notes (Signed)

## 2019-12-05 NOTE — ED Notes (Signed)
Hourly rounding reveals patient sleeping in room. No complaints, stable, in no acute distress. Q15 minute rounds and monitoring via Security Cameras to continue. 

## 2019-12-05 NOTE — ED Notes (Signed)
Hourly rounding reveals patient sleeping in room. No complaints, stable, in no acute distress. Q15 minute rounds and monitoring via Rover and Officer to continue.  

## 2019-12-05 NOTE — ED Notes (Signed)
Writer was able to fax patients information to the following facilities:   [facility]- [phone]  [facility]- [phone]  [phone]    Faxed information should be checked on during day shift.

## 2019-12-05 NOTE — ED Provider Notes (Signed)
Emergency Medicine Observation Re-evaluation Note  Nathan Koch is a 29 y.o. male, seen on rounds today.  Pt initially presented to the ED for complaints of No chief complaint on file. Currently, the patient is resting.  Physical Exam  BP 110/62   Pulse 60   Temp 97.7 F (36.5 C) (Oral)   Resp 17   SpO2 100%  Physical Exam  Constitutional: Patient is awake resting comfortably in bed Respiratory: Patient is in no respiratory distress Psych: Patient is not agitated  ED Course / MDM  EKG:    I have reviewed the labs performed to date as well as medications administered while in observation.  Recent changes in the last 24 hours include none. Plan  Current plan is for psych/sw.    Willy Eddy, MD 12/05/19 [phone]

## 2019-12-05 NOTE — Progress Notes (Addendum)
[facility] MD Progress Note  12/05/2019 12:47 PM Nathan Koch  MRN:  [id number]   Subjective:   I am still suicidal I do want to actively harm self because my depression is worse and all.   Spent extra time today and yesterday to understand the nature of his SI and need for inpatient as opposed to direct group home /shelter or 1/2 way house instead.    Principal Problem:  Continues to voice Si and possible plans.  He requests inpatient admission.  He feels this past time was worse than before due to marital discord and issues with mom in law making direct racial insults about his skin color.  He is frustrated upset and has shame and blame for relapsing after being triggered by mom in law.  He is bereavement due to separation from 3 month old child and  Even though he helped his wife who is caucasian move back into Mom in Law's house  He now knows he cannot go back there.    So when he was discharged the other day he came back 30 min later with unresolved SI feelings and plans different than before .  He realizes multiple times he has come and chronic addictive personality dysfunction but also feels his medications as well may be wearing off and possibly needs second med now for depression  He feels his depression is worsening on the scale with increasing depressed mood, lack of energy, sleep enthusiasm motivation and related symptoms with anxiety -----and also with stronger SI than before.  He feels more hopeless and helpless desperate compared to recent set of issues over some months due to reaching another rock bottom with his critical and cruel Mom in law and not being able to reconcile with wife due to the extreme racial tension the Mom is saying to him.   Although he has personality issues and secondary gain   ---this MD feels he still needs inpatient level of care to err on side of caution as substance dependence and personality disordered with depression are still statistically at risk of Self  harm severe or suicide      "He says this time is different where I have much stronger depression and thoughts of harming Self due to worsening external issues and coming down from meth binge.  Also my medications are wearing off.  I tried to tell you and team prior to my recent discharge that I was suicidal but I was discharged and I just could not go to shelter or other so fast that is why I came back in 30 min to be rexamined and readmitted to ER"   He awaits a Psych bed.    This note is written to further clarify that this SI and possible plan is stronger than previous admits and that documentation on this admission has not reflected this because we needed more time and observation of his issues when he came to [place] again and now he is in the BHU at this time.           Diagnosis: Active Problems:   Asthma   Methamphetamine use ([name])   MDD (major depressive disorder), recurrent, severe, with psychosis ([name])   MDD (major depressive disorder), single episode, severe with psychosis ([name])   Depression, major, recurrent, severe with psychosis ([name])  Possible bipolar disorder mixed  Generalized anxiety  Marital Discord, family discord Adjustment disorder with mixed anxiety and depression emotions and conduct    Total Time spent with patient:  about 30 min   Past Psychiatric History:  Already discussed   Past Medical History:  Past Medical History:  Diagnosis Date  . Anxiety   . Asthma   . Bipolar 1 disorder ([name])   . Depression   . Methamphetamine abuse [facility]([name])     Past Surgical History:  Procedure Laterality Date  . CLOSED REDUCTION MANDIBLE N/A 07/02/2018   Procedure: CLOSED REDUCTION MANDIBULAR;  Surgeon: Vernie MurdersJuengel, Paul, MD;  Location: [facility] ORS;  Service: ENT;  Laterality: N/A;   Family History:  Family History  Problem Relation Age of Onset  . Asthma Mother   . Asthma Brother    Family Psychiatric  History:  Social History:  Social History   Substance and Sexual  Activity  Alcohol Use Not Currently  . Alcohol/week: 7.0 standard drinks  . Types: 7 Standard drinks or equivalent per week     Social History   Substance and Sexual Activity  Drug Use Yes  . Types: Methamphetamines   Comment: last use was 2 days ago    Social History   Socioeconomic History  . Marital status: Single    Spouse name: Not on file  . Number of children: Not on file  . Years of education: Not on file  . Highest education level: Not on file  Occupational History  . Not on file  Tobacco Use  . Smoking status: Current Every Day Smoker    Packs/day: 1.00    Types: Cigarettes  . Smokeless tobacco: Never Used  . Tobacco comment: started smoking again December 2019  Substance and Sexual Activity  . Alcohol use: Not Currently    Alcohol/week: 7.0 standard drinks    Types: 7 Standard drinks or equivalent per week  . Drug use: Yes    Types: Methamphetamines    Comment: last use was 2 days ago  . Sexual activity: Not on file  Other Topics Concern  . Not on file  Social History Narrative  . Not on file   Social Determinants of Health   Financial Resource Strain:   . Difficulty of Paying Living Expenses:   Food Insecurity:   . Worried About Programme researcher, broadcasting/film/videounning Out of Food in the Last Year:   . Baristaan Out of Food in the Last Year:   Transportation Needs:   . Freight forwarderLack of Transportation (Medical):   Marland Kitchen. Lack of Transportation (Non-Medical):   Physical Activity:   . Days of Exercise per Week:   . Minutes of Exercise per Session:   Stress:   . Feeling of Stress :   Social Connections:   . Frequency of Communication with Friends and Family:   . Frequency of Social Gatherings with Friends and Family:   . Attends Religious Services:   . Active Member of Clubs or Organizations:   . Attends BankerClub or Organization Meetings:   Marland Kitchen. Marital Status:    Additional Social History:    Pain Medications: See MAR Prescriptions: See MAR Over the Counter: See MAR History of alcohol / drug use?:  Yes Name of Substance 1: Methamphetamine              Sleep:  At times has early am awakening   Appetite:  Fair   Current Medications: Current Facility-Administered Medications  Medication Dose Route Frequency Provider Last Rate Last Admin  . citalopram (CELEXA) tablet 20 mg  20 mg Oral Daily Arnaldo NatalMalinda, Paul F, MD   20 mg at 12/05/19 1230  . divalproex (DEPAKOTE) DR tablet 500 mg  500  mg Oral Q12H Arnaldo Natal, MD   500 mg at 12/05/19 1230  . hydrOXYzine (ATARAX/VISTARIL) tablet 25 mg  25 mg Oral Q6H PRN Arnaldo Natal, MD   25 mg at 12/02/19 2234  . QUEtiapine (SEROQUEL) tablet 100 mg  100 mg Oral QHS Arnaldo Natal, MD   100 mg at 12/04/19 2205   Current Outpatient Medications  Medication Sig Dispense Refill  . citalopram (CELEXA) 20 MG tablet Take 1 tablet (20 mg total) by mouth daily. 30 tablet 0  . divalproex (DEPAKOTE) 500 MG DR tablet Take 1 tablet (500 mg total) by mouth every 12 (twelve) hours. 60 tablet 0  . fluticasone furoate-vilanterol (BREO ELLIPTA) 200-25 MCG/INH AEPB Inhale 1 puff into the lungs daily. 30 each 0  . Multiple Vitamin (MULTIVITAMIN WITH MINERALS) TABS tablet Take 1 tablet by mouth daily.    . QUEtiapine (SEROQUEL) 100 MG tablet Take 1 tablet (100 mg total) by mouth at bedtime. 30 tablet 0  . acetaminophen (TYLENOL) 500 MG tablet Take 500 mg by mouth every 6 (six) hours as needed for moderate pain.    Marland Kitchen albuterol (VENTOLIN HFA) 108 (90 Base) MCG/ACT inhaler Inhale 2 puffs into the lungs every 6 (six) hours as needed for wheezing or shortness of breath. 18 g 0  . cetirizine (ZYRTEC) 10 MG tablet Take 1 tablet by mouth daily.    . hydrOXYzine (ATARAX/VISTARIL) 25 MG tablet Take 1 tablet (25 mg total) by mouth every 6 (six) hours as needed for anxiety. 30 tablet 0    Lab Results: No results found for this or any previous visit (from the past 48 hour(s)).  Blood Alcohol level:  Lab Results  Component Value Date   ETH <10 11/30/2019   ETH <10  11/21/2019    Metabolic Disorder Labs: No results found for: HGBA1C, MPG No results found for: PROLACTIN Lab Results  Component Value Date   CHOL 156 07/11/2014   TRIG 178 (A) 07/11/2014   HDL 46 07/11/2014   LDLCALC 74 07/11/2014    Physical Findings: AIMS:  , ,  ,  ,   not done  CIWA:    COWS:     Musculoskeletal: Strength & Muscle Tone: normal  Gait & Station: normal  Patient leans: normal   Psychiatric Specialty Exam: Physical Exam  Review of Systems  Blood pressure 115/69, pulse 66, temperature 98 F (36.7 C), temperature source Oral, resp. rate 18, SpO2 98 %.There is no height or weight on file to calculate BMI.    Mental Status  Akathisia none Sleep --on and off Recall okay Cognition okay except when taking drugs Aims not done  Handedness not known Leans --none ADL's  ---same  Assets --seeks help Liabilities --addictive personality  Oriented to person place date and time Consciousness not clouded or fluctuant Mood depressed and affect constricted Movements --no tics shakes and tremors Memory --remote recent immediate okay Fund of knowledge intelligence normal Judgement insight fair Reliability fair Si and HI --no HI but does have active thoughts of SI  Abstraction normal Speech normal rate tone volume fluency Consciousness not clouded or fluctuant Attention concentration okay  Thought process and content --no psychosis or mania Mainly depressive themes anxious themes -- Stronger SI preoccupation  Treatment Plan Summary:  Looking at past admit and this one patient has stronger SI and does have stronger risk of possibly harming self  He is under duress from homeless state, separation from 25 month old racial insults from Mom in law,  Shame and blame for recent relapse and issues   He is not definitvely contracting for safety  Secondary gain in this situation is less   And so we need to err on side of caution and admit this individual rather than discharging to shelter at this time   He is awaiting bed downstairs or referred out   He does have dual diagnosis problems  Not just substance  meds are adjusted with the addition of  Small dose of navane ---(monotherapy failure ) added at hs along with increase of celexa and the addition of TCA nortryptiline 10 mg qhs   To see if this also helps in this case  Also needs admission for closer obs for new meds, structured support in 24/7 environment ---better safety plan prior to discharge hopefully to half way house where he was having at least two month success before a discrepancy and misunderstanding occurred where he had to go to court and they claimed he did not sign out   But he felt he did etc.    Was actually sober over two -three months or more prior     Roselind Messier, MD 12/05/2019, 12:47 PM

## 2019-12-06 NOTE — BH Assessment (Signed)
Discussed patient with Dolores Frame who reports that she will pass patient along to dayshift 12/07/19 for review

## 2019-12-06 NOTE — BH Assessment (Addendum)
Referral information for Psychiatric Hospitalization faxed to:  [facility] ([phone]) Under review per Paulla Dolly  [facility] [phone] or [phone]) 10am Denied due to age  [facility] [phone]), 6:30PM Refaxed per Tamara's request; agreed to review.

## 2019-12-06 NOTE — ED Provider Notes (Signed)
Emergency Medicine Observation Re-evaluation Note  Nathan Koch is a 29 y.o. male, seen on rounds today.  Pt initially presented to the ED for complaints of No chief complaint on file. Currently, the patient is resting without distress.  Physical Exam  BP 121/77   Pulse 63   Temp 98.2 F (36.8 C) (Oral)   Resp 18   SpO2 99%    ED Course / MDM  EKG:    I have reviewed the labs performed to date as well as medications administered while in observation.  Recent changes in the last 24 hours include none. patient awaiting psychiatric placement, has been referred to several facilities. Plan  Current plan is for psychiatric placement, referred to several facilities.    Sharyn Creamer, MD 12/06/19 [phone]

## 2019-12-06 NOTE — BH Assessment (Addendum)
Referral information for Psychiatric Hospitalization faxed to:             [facility] ([phone]), No insurance               [place] [phone]), Denied, did not specify reason why             [facility] [phone] -or- [phone]), Refaxed referral to [facility] [phone] -[phone])            [phone] Denied to age             [facility] [phone] or [phone])             [facility] [phone] or [phone]),

## 2019-12-07 NOTE — BH Assessment (Signed)
Writer spoke with the patient to complete an updated/reassessment. Patient reports of having SI with no current specific plan. He denies HI and AV/H.

## 2019-12-07 NOTE — ED Notes (Signed)
Pt given dinner tray and sprite.  

## 2019-12-07 NOTE — ED Provider Notes (Signed)
Emergency Medicine Observation Re-evaluation Note  Nathan Koch is a 29 y.o. male, seen on rounds today.  Pt initially presented to the ED for complaints of No chief complaint on file. Currently, the patient is resting.  Physical Exam  BP 138/65 (BP Location: Left Arm)   Pulse 70   Temp (!) 97.5 F (36.4 C) (Oral)   Resp 20   SpO2 100%  Physical Exam  ED Course / MDM  EKG:    I have reviewed the labs performed to date as well as medications administered while in observation.  Recent changes in the last 24 hours include none. Plan  Current plan is for psych placement.    Willy Eddy, MD 12/07/19 [phone]

## 2019-12-07 NOTE — ED Notes (Signed)
Snack tray and drink provided 

## 2019-12-08 LAB — SARS CORONAVIRUS 2 BY RT PCR (HOSPITAL ORDER, PERFORMED IN ~~LOC~~ HOSPITAL LAB): SARS Coronavirus 2: NEGATIVE

## 2019-12-08 NOTE — ED Notes (Signed)
Pt discharged to [facility] via [employer].  VS stable. Backpack, shoes and inhaler from [facility] sent with patient.

## 2019-12-08 NOTE — BH Assessment (Signed)
Refaxed referral to [facility]. Fax received; pt currently under review.  

## 2019-12-08 NOTE — ED Notes (Signed)
RN attempted to call report to [employer].  This Clinical research associate left name and contact number for accepting RN to call for report when available.

## 2019-12-08 NOTE — Final Progress Note (Signed)
Physician Final Progress Note  Patient ID: Nathan Koch MRN: [id number] DOB/AGE: 1990/08/15 29 y.o.  Admit date: 12/02/2019 Admitting provider: No admitting provider for patient encounter. Discharge date: 12/08/2019   Admission Diagnoses:  Major depression severe recurrent, generalized anxiety  Amphetamine dependence     Discharge Diagnoses:  Active Problems:   Asthma   Methamphetamine use ([name])   MDD (major depressive disorder), recurrent, severe, with psychosis ([name])   MDD (major depressive disorder), single episode, severe with psychosis ([name])   Depression, major, recurrent, severe with psychosis ([name])  Same   Consults: TTS   Psych MD ER MD    Significant Findings/ Diagnostic Studies:  None  Procedures:  None   Discharge Condition: fair   Disposition:  transferred to [facility]   Diet:    As tolerated   Discharge Activity:  Participate in all recovery --stay sober, stay on medications, go to AA NA and half way house after   Go to anger mgt and all after     Patient had waxing and waning course where finally was admitted to [facility] after having legitimate adjustment issues as well as family discord and major depression with unclear safety margin where he was feeling suicidal due to separation from child and his mom in law making direct racial insults at him, along with separation from wife  He patiently waited this time in BHU for bed and transferred safely     MS Exam  Alert cooperative oriented times four Not clouded or fluctuant Concentration and attention okay Mood depressed affect constricted Anxious No tics or movement problems  Thought process and content --depressive and suicidal themes SI and HI    ---still with active SI not clear if discharged if he would be safe Fund of knowledge and intelligence normal Judgement insight reliability fair  Memory remote and recent intact through general questions Abstraction normal  Speech normal    Aims not done Cognition okay Sleep normal Movements none Musculoskeletal okay Gait and station normal Leans --normal  No akathisia   Transferred to [facility] for disposition after spending time in BHU --recently   Discharged then came back 30 min later suicidal  Stayed in [place] and now going to [facility]       Total time spent taking care of this patient: greater than  minutes  Signed: Roselind Messier 12/08/2019, 12:30 PM

## 2019-12-08 NOTE — ED Provider Notes (Signed)
Emergency Medicine Observation Re-evaluation Note  Nathan Koch is a 29 y.o. male, seen on rounds today.  Pt initially presented to the ED for complaints of detox. Currently, the patient is resting comfortably.  Physical Exam  BP (!) 117/58 (BP Location: Left Arm)   Pulse 70   Temp 98.1 F (36.7 C) (Oral)   Resp 17   SpO2 100%  Physical Exam Constitutional:      General: He is not in acute distress.    Appearance: He is not ill-appearing or toxic-appearing.  HENT:     Head: Atraumatic.  Eyes:     Extraocular Movements: Extraocular movements intact.     Pupils: Pupils are equal, round, and reactive to light.  Cardiovascular:     Rate and Rhythm: Normal rate and regular rhythm.  Pulmonary:     Effort: Pulmonary effort is normal.  Abdominal:     General: There is no distension.  Musculoskeletal:        General: No signs of injury.  Neurological:     General: No focal deficit present.     ED Course / MDM  EKG:    I have reviewed the labs performed to date as well as medications administered while in observation.  Recent changes in the last 24 hours include continued bed search. Plan  Current plan is for inpatient psych placement. Patient is under full IVC at this time.   Delton Prairie, MD 12/08/19 [phone]

## 2019-12-08 NOTE — BH Assessment (Signed)
Patient has been accepted to [facility].  Patient assigned to [facility] Accepting physician is Dr. Knox Saliva.  Call report to [phone].  Representative was [employer].   ER Staff is aware of it:  [place], ER Secretary  Amy B., Patient's Nurse    Address: [address]  [place], [place] [address]

## 2019-12-08 NOTE — ED Notes (Signed)
RN called again to give report. The nurse is still not available.  RN told the admissions staff the patient may leave [facility] befrore report is given.  Admissions expressed understanding and acknowledged my 2 attempts to call report.

## 2020-01-25 ENCOUNTER — Ambulatory Visit: Payer: Self-pay | Admitting: Physician Assistant

## 2020-01-25 ENCOUNTER — Encounter: Payer: Self-pay | Admitting: Physician Assistant

## 2020-01-25 ENCOUNTER — Other Ambulatory Visit: Payer: Self-pay

## 2020-01-25 VITALS — BP 100/60 | HR 83 | Temp 97.9°F | Ht 68.0 in | Wt 212.0 lb

## 2020-01-25 DIAGNOSIS — Z7689 Persons encountering health services in other specified circumstances: Secondary | ICD-10-CM

## 2020-01-25 DIAGNOSIS — Z7289 Other problems related to lifestyle: Secondary | ICD-10-CM

## 2020-01-25 DIAGNOSIS — F1911 Other psychoactive substance abuse, in remission: Secondary | ICD-10-CM

## 2020-01-25 DIAGNOSIS — F17200 Nicotine dependence, unspecified, uncomplicated: Secondary | ICD-10-CM

## 2020-01-25 MED ORDER — ALBUTEROL SULFATE HFA 108 (90 BASE) MCG/ACT IN AERS
2.0000 | INHALATION_SPRAY | Freq: Four times a day (QID) | RESPIRATORY_TRACT | 1 refills | Status: DC | PRN
Start: 2020-01-25 — End: 2020-05-27

## 2020-01-25 NOTE — Progress Notes (Signed)
BP 100/60    Pulse 83    Temp 97.9 F (36.6 C)    Ht 5\' 8"  (1.727 m)    Wt 212 lb (96.2 kg)    SpO2 95%    BMI 32.23 kg/m    Subjective:    Patient ID: , male    DOB: [birth year], 29 y.o.   MRN: [id number]  HPI: Nathan Koch is a 29 y.o. male presenting on 01/25/2020 for New Patient (Initial Visit) (no previous PCP. currently seeing Dr. 01/27/2020 at [facility] for mental health)   HPI   Pt had a negative covid 19 screening questionnaire.    Pt is a 29yoM who presents to establish care.  He is currently living at [facility] house to get help with addiction.  Prior to that he was living in [place].  He is going to [facility].  He says the psychiatrist is planning to stop his depakote.  He says that he didn't have the $9 to get his seroquel.    He says he got covid vaccination but does not have his card with him.       Relevant past medical, surgical, family and social history reviewed and updated as indicated. Interim medical history since our last visit reviewed. Allergies and medications reviewed and updated.   Current Outpatient Medications:    divalproex (DEPAKOTE) 500 MG DR tablet, Take 1 tablet (500 mg total) by mouth every 12 (twelve) hours., Disp: 60 tablet, Rfl: 0   QUEtiapine (SEROQUEL) 100 MG tablet, Take 1 tablet (100 mg total) by mouth at bedtime. (Patient not taking: Reported on 01/25/2020), Disp: 30 tablet, Rfl: 0     Review of Systems  Per HPI unless specifically indicated above     Objective:    BP 100/60    Pulse 83    Temp 97.9 F (36.6 C)    Ht 5\' 8"  (1.727 m)    Wt 212 lb (96.2 kg)    SpO2 95%    BMI 32.23 kg/m   Wt Readings from Last 3 Encounters:  01/25/20 212 lb (96.2 kg)  11/30/19 220 lb (99.8 kg)  11/21/19 (!) 220 lb (99.8 kg)    Physical Exam Vitals reviewed.  Constitutional:      General: He is not in acute distress.    Appearance: He is well-developed. He is not ill-appearing.  HENT:     Head: Normocephalic and atraumatic.  Eyes:      Conjunctiva/sclera: Conjunctivae normal.     Pupils: Pupils are equal, round, and reactive to light.  Neck:     Thyroid: No thyromegaly.  Cardiovascular:     Rate and Rhythm: Normal rate and regular rhythm.  Pulmonary:     Effort: Pulmonary effort is normal.     Breath sounds: Normal breath sounds. No wheezing or rales.  Abdominal:     General: Bowel sounds are normal.     Palpations: Abdomen is soft. There is no mass.     Tenderness: There is no abdominal tenderness.  Musculoskeletal:     Cervical back: Neck supple.     Right lower leg: No edema.     Left lower leg: No edema.  Lymphadenopathy:     Cervical: No cervical adenopathy.  Skin:    General: Skin is warm and dry.     Findings: No rash.  Neurological:     Mental Status: He is alert and oriented to person, place, and time.  Psychiatric:  Attention and Perception: Attention normal.        Speech: Speech normal.        Behavior: Behavior normal. Behavior is cooperative.     Comments: Pleasant and converses normally.            Assessment & Plan:    Encounter Diagnoses  Name Primary?   Encounter to establish care Yes   Substance abuse in remission [facility])    Tobacco use disorder    Engages in vaping      -Got him signed up for [facility] -Gave sample proair -Pt to continue with [facility] -Pt to follow up 1 year.  He is to contact office sooner prn

## 2020-04-09 ENCOUNTER — Encounter: Payer: Self-pay | Admitting: Physician Assistant

## 2020-04-09 ENCOUNTER — Ambulatory Visit: Payer: Self-pay | Admitting: Physician Assistant

## 2020-04-09 VITALS — BP 126/70 | HR 78 | Temp 98.0°F | Ht 68.0 in | Wt 228.0 lb

## 2020-04-09 DIAGNOSIS — H9311 Tinnitus, right ear: Secondary | ICD-10-CM

## 2020-04-09 NOTE — Patient Instructions (Signed)

## 2020-04-09 NOTE — Progress Notes (Signed)
   BP 126/70   Pulse 78   Temp 98 F (36.7 C)   Ht 5\' 8"  (1.727 m)   Wt 228 lb (103.4 kg)   SpO2 99%   BMI 34.67 kg/m    Subjective:    Patient ID: , male    DOB: [birth year], 29 y.o.   MRN: [id number]  HPI: Nathan Koch is a 29 y.o. male presenting on 04/09/2020 for Otalgia (R ear pt c/o hearin gloss and ringing of R ear for a couple years, but has gotten worse in the past 2 weeks. Pt states he can hear but it is very hard. No pain. Constant ringing.)   HPI   Pt had a negative covid 19 screening questionnaire.   Chief Complaint  Patient presents with  . Otalgia    R ear pt c/o hearin gloss and ringing of R ear for a couple years, but has gotten worse in the past 2 weeks. Pt states he can hear but it is very hard. No pain. Constant ringing.    Pt is still staying at [facility] house to get help with addiction.    Relevant past medical, surgical, family and social history reviewed and updated as indicated. Interim medical history since our last visit reviewed. Allergies and medications reviewed and updated.   Current Outpatient Medications:  .  albuterol (VENTOLIN HFA) 108 (90 Base) MCG/ACT inhaler, Inhale 2 puffs into the lungs every 6 (six) hours as needed for wheezing or shortness of breath., Disp: 3 each, Rfl: 1 .  QUEtiapine (SEROQUEL) 100 MG tablet, Take 1 tablet (100 mg total) by mouth at bedtime., Disp: 30 tablet, Rfl: 0 .  divalproex (DEPAKOTE) 500 MG DR tablet, Take 1 tablet (500 mg total) by mouth every 12 (twelve) hours. (Patient not taking: Reported on 04/09/2020), Disp: 60 tablet, Rfl: 0    Review of Systems  Per HPI unless specifically indicated above     Objective:    BP 126/70   Pulse 78   Temp 98 F (36.7 C)   Ht 5\' 8"  (1.727 m)   Wt 228 lb (103.4 kg)   SpO2 99%   BMI 34.67 kg/m   Wt Readings from Last 3 Encounters:  04/09/20 228 lb (103.4 kg)  01/25/20 212 lb (96.2 kg)  11/30/19 220 lb (99.8 kg)      PE:  NAD.  A&O.   Skin w&d.  Neck supple without adenopathy.  TMs and ear canals normal bilaterally.  Hearing grossly intact.   Lungs CTA bilaterally. Cor rrr without mrg Gait normal.  Pt is cooperative and responds appropriately.       Assessment & Plan:     Encounter Diagnosis  Name Primary?  . Tinnitus of right ear Yes    Discussed tinnitus with pt.  Ideally he would see ENT but no ENT that accepts [facility] financial assistance.  Discussed making his own arrangements to see ENT if he wanted.  He was given reading information on tinnitus.  He can contact office if it worsens or if he develops other symptoms.

## 2020-05-23 ENCOUNTER — Ambulatory Visit: Payer: Self-pay | Admitting: Physician Assistant

## 2020-05-23 ENCOUNTER — Other Ambulatory Visit: Payer: Self-pay

## 2020-05-23 DIAGNOSIS — Z20822 Contact with and (suspected) exposure to covid-19: Secondary | ICD-10-CM

## 2020-05-23 DIAGNOSIS — R52 Pain, unspecified: Secondary | ICD-10-CM

## 2020-05-23 DIAGNOSIS — R0981 Nasal congestion: Secondary | ICD-10-CM

## 2020-05-23 DIAGNOSIS — R059 Cough, unspecified: Secondary | ICD-10-CM

## 2020-05-23 NOTE — Progress Notes (Signed)
   There were no vitals taken for this visit.   Subjective:    Patient ID: Nathan Koch, male    DOB: [birth year], 29 y.o.   MRN: [id number]  HPI: Nathan Koch is a 30 y.o. male presenting on 05/23/2020 for No chief complaint on file.   HPI  This is a telemedicine appointment through [employer] due to coronavirus pandemic.  I connected with  Nathan Koch on 05/23/20 by a video enabled telemedicine application and verified that I am speaking with the correct person using two identifiers.   I discussed the limitations of evaluation and management by telemedicine. The patient expressed understanding and agreed to proceed.  Pt is at the group home where he is living.  Provider is at office.     Pt is Sick. It Started last night.  He has body aches.  No fever.  He also has cough and congestion.  He says he has A bit of sob but he has asthma and is using his inhaler.    Pt says he got both covid shots and he Got the booster    Relevant past medical, surgical, family and social history reviewed and updated as indicated. Interim medical history since our last visit reviewed. Allergies and medications reviewed and updated.   Current Outpatient Medications:  .  albuterol (VENTOLIN HFA) 108 (90 Base) MCG/ACT inhaler, Inhale 2 puffs into the lungs every 6 (six) hours as needed for wheezing or shortness of breath., Disp: 3 each, Rfl: 1 .  divalproex (DEPAKOTE) 500 MG DR tablet, Take 1 tablet (500 mg total) by mouth every 12 (twelve) hours. (Patient not taking: Reported on 04/09/2020), Disp: 60 tablet, Rfl: 0 .  QUEtiapine (SEROQUEL) 100 MG tablet, Take 1 tablet (100 mg total) by mouth at bedtime., Disp: 30 tablet, Rfl: 0    Review of Systems  Per HPI unless specifically indicated above     Objective:    There were no vitals taken for this visit.  Wt Readings from Last 3 Encounters:  04/09/20 228 lb (103.4 kg)  01/25/20 212 lb (96.2 kg)  11/30/19 220 lb (99.8 kg)    Physical  Exam Constitutional:      General: He is not in acute distress.    Appearance: He is ill-appearing. He is not toxic-appearing.  HENT:     Head: Normocephalic and atraumatic.  Pulmonary:     Effort: No respiratory distress.     Comments: Pt is speaking in complete sentences without dyspnea.  Neurological:     Mental Status: He is alert and oriented to person, place, and time.  Psychiatric:        Attention and Perception: Attention normal.        Speech: Speech normal.        Behavior: Behavior is cooperative.              Assessment & Plan:     Encounter Diagnoses  Name Primary?  . Suspected COVID-19 virus infection Yes  . Body aches   . Cough   . Nasal congestion      -Pt is scheduled for covid testing this morning. -He is counseled on self-isolation.   -[facility] house staff will transport him to his testing appointment today

## 2020-05-24 LAB — NOVEL CORONAVIRUS, NAA: SARS-CoV-2, NAA: NOT DETECTED

## 2020-05-24 LAB — SARS-COV-2, NAA 2 DAY TAT

## 2020-05-26 ENCOUNTER — Telehealth: Payer: Self-pay | Admitting: Physician Assistant

## 2020-05-26 NOTE — Telephone Encounter (Signed)
Attempted to contact pt with covid test results.  Both listed phone numbers were called and got voicemail on both.  Will attempt to call later.

## 2020-05-27 ENCOUNTER — Other Ambulatory Visit: Payer: Self-pay | Admitting: Physician Assistant

## 2020-05-27 MED ORDER — ALBUTEROL SULFATE HFA 108 (90 BASE) MCG/ACT IN AERS: 2.0000 | INHALATION_SPRAY | Freq: Four times a day (QID) | RESPIRATORY_TRACT | 1 refills | Status: DC | PRN

## 2020-05-27 NOTE — Telephone Encounter (Signed)
Pt called the office back and was notified that his COVID test was NEGATIVE. Pt verbalized understanding.

## 2020-06-22 ENCOUNTER — Other Ambulatory Visit: Payer: Self-pay

## 2020-06-22 ENCOUNTER — Emergency Department (HOSPITAL_COMMUNITY)
Admission: EM | Admit: 2020-06-22 | Discharge: 2020-06-22 | Disposition: A | Discharge status: Home / Self Care | Payer: Self-pay | Attending: Emergency Medicine | Admitting: Emergency Medicine

## 2020-06-22 DIAGNOSIS — Z79899 Other long term (current) drug therapy: Secondary | ICD-10-CM | POA: Insufficient documentation

## 2020-06-22 DIAGNOSIS — F17210 Nicotine dependence, cigarettes, uncomplicated: Secondary | ICD-10-CM | POA: Insufficient documentation

## 2020-06-22 DIAGNOSIS — J45909 Unspecified asthma, uncomplicated: Secondary | ICD-10-CM | POA: Insufficient documentation

## 2020-06-22 DIAGNOSIS — K047 Periapical abscess without sinus: Secondary | ICD-10-CM | POA: Insufficient documentation

## 2020-06-22 DIAGNOSIS — R220 Localized swelling, mass and lump, head: Secondary | ICD-10-CM

## 2020-06-22 MED ORDER — PENICILLIN V POTASSIUM 500 MG PO TABS: 500.0000 mg | ORAL_TABLET | Freq: Four times a day (QID) | ORAL | 0 refills | Status: AC

## 2020-06-22 NOTE — ED Provider Notes (Signed)
[facility] EMERGENCY DEPARTMENT Provider Note   CSN: [id number] Arrival date & time: 06/22/20  2878     History Chief Complaint  Patient presents with  . Facial Swelling    Right side     Nathan Koch is a 30 y.o. male.  HPI   Patient with no significant medical history presents to the emergency department with chief complaint of right facial swelling x2 days.  Patient endorses that he has slight tenderness when it is pressed describes as a dull sense of sensation, but denies torticollis, trismus, difficulty swallowing, trouble with breathing.  He is not immunocompromise, states he is up-to-date on his childhood vaccines.  He denies any recent trauma to the area, states has not seen a dentist in a long time.  He denies any other symptoms at this time.  Patient denies headaches, fevers, chills, shortness of breath, chest pain, abdominal pain, nausea, vomiting, diarrhea, worsening pedal edema.  Past Medical History:  Diagnosis Date  . Anxiety   . Anxiety disorder   . Asthma   . Bipolar 1 disorder ([name])   . Depression   . Methamphetamine abuse ([name])   . PTSD (post-traumatic stress disorder)     Patient Active Problem List   Diagnosis Date Noted  . 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 MDD (major depressive disorder), recurrent, severe, with psychosis ([name]) 09/14/2019  . MDD (major depressive disorder), single episode, severe with psychosis ([name]) 09/14/2019  . Asthma 06/26/2014    Past Surgical History:  Procedure Laterality Date  . CLOSED REDUCTION MANDIBLE N/A 07/02/2018   Procedure: CLOSED REDUCTION MANDIBULAR;  Surgeon: Vernie Murders, MD;  Location: [facility] ORS;  Service: ENT;  Laterality: N/A;       Family History  Problem Relation Age of Onset  . Asthma Mother   . Cancer Mother        breast cancer  . Ulcers Mother   . Cancer Father        esophogeal cancer  . Hypertension Father   .  Asthma Brother     Social History   Tobacco Use  . Smoking status: Current Every Day Smoker    Packs/day: 0.50    Years: 8.00    Pack years: 4.00    Types: Cigarettes  . Smokeless tobacco: Never Used  Vaping Use  . Vaping Use: Every day  Substance Use Topics  . Alcohol use: Not Currently    Alcohol/week: 7.0 standard drinks    Types: 7 Standard drinks or equivalent per week    Comment: previously heavy drinker 2016. most recent 4 beer/ day drinker and none since 01-11-2020  . Drug use: Not Currently    Types: Methamphetamines    Comment: last use was 01-11-2020    Home Medications Prior to Admission medications   Medication Sig Start Date End Date Taking? Authorizing Provider  albuterol (VENTOLIN HFA) 108 (90 Base) MCG/ACT inhaler Inhale 2 puffs into the lungs every 6 (six) hours as needed for wheezing or shortness of breath. 05/27/20  Yes Jacquelin Hawking, PA-C  penicillin v potassium (VEETID) 500 MG tablet Take 1 tablet (500 mg total) by mouth 4 (four) times daily for 7 days. 06/22/20 06/29/20 Yes Carroll Sage, PA-C  QUEtiapine (SEROQUEL) 100 MG tablet Take 1 tablet (100 mg total) by mouth at bedtime. 10/17/19  Yes Clapacs, Jackquline Denmark, MD  divalproex (DEPAKOTE) 500 MG DR tablet Take 1 tablet (500 mg total) by mouth  every 12 (twelve) hours. Patient not taking: No sig reported 10/17/19   Clapacs, Jackquline Denmark, MD    Allergies    Patient has no known allergies.  Review of Systems   Review of Systems  Constitutional: Negative for chills and fever.  HENT: Positive for facial swelling. Negative for congestion, sore throat and trouble swallowing.   Respiratory: Negative for shortness of breath.   Cardiovascular: Negative for chest pain.  Gastrointestinal: Negative for abdominal pain, nausea and vomiting.  Genitourinary: Negative for enuresis.  Musculoskeletal: Negative for back pain.  Skin: Negative for rash.  Neurological: Negative for dizziness and headaches.  Hematological: Does  not bruise/bleed easily.    Physical Exam Updated Vital Signs BP 120/60   Pulse 80   Temp 98.5 F (36.9 C) (Oral)   Resp 16   Ht 5\' 8"  (1.727 m)   Wt 93.4 kg   SpO2 98%   BMI 31.32 kg/m   Physical Exam Vitals and nursing note reviewed.  Constitutional:      General: He is not in acute distress.    Appearance: He is not ill-appearing.  HENT:     Head: Normocephalic and atraumatic.     Comments: Patient has noted swelling on the right side of his face along his mid mandible, there is no overlying erythema, no lacerations or abrasions, drainage or discharge present.  Area was tender to palpation, no fluctuance or indurations present.    Nose: No congestion.     Mouth/Throat:     Mouth: Mucous membranes are moist.     Pharynx: Oropharynx is clear. No oropharyngeal exudate or posterior oropharyngeal erythema.     Comments: Patient  tongue and uvula are both midline, controlling his own secretions without difficulty.  Patient has noted poor dental hygiene, patient has noted tooth decay on his right lower bottom molars, gumline was palpated nontender to palpation, no fluctuance or indurations present. Eyes:     Conjunctiva/sclera: Conjunctivae normal.  Cardiovascular:     Rate and Rhythm: Normal rate and regular rhythm.     Pulses: Normal pulses.     Heart sounds: No murmur heard. No friction rub. No gallop.   Pulmonary:     Effort: No respiratory distress.     Breath sounds: No wheezing, rhonchi or rales.  Musculoskeletal:     Cervical back: Normal range of motion.     Comments: Patient is moving all 4 extremities at difficulty.  Skin:    General: Skin is warm and dry.  Neurological:     Mental Status: He is alert.  Psychiatric:        Mood and Affect: Mood normal.     ED Results / Procedures / Treatments   Labs (all labs ordered are listed, but only abnormal results are displayed) Labs Reviewed - No data to display  EKG None  Radiology No results  found.  Procedures Procedures   Medications Ordered in ED Medications - No data to display  ED Course  I have reviewed the triage vital signs and the nursing notes.  Pertinent labs & imaging results that were available during my care of the patient were reviewed by me and considered in my medical decision making (see chart for details).    MDM Rules/Calculators/A&P                          Initial impression-patient presents with right-sided facial swelling.  He is alert, does not appear in acute  distress, vital signs reassuring.  Work-up-due to well-appearing patient, benign physical exam, further lab or imaging not warranted at this time.  Rule out-I have low suspicion for peritonsillar abscess, retropharyngeal abscess, or Ludwig angina as oropharynx was visualized tongue and uvula were both midline, there is no exudates, erythema or edema noted in the posterior pillars or on/ around tonsils.  Low suspicion for an abscess as gumline were palpated no fluctuance or induration felt.  Low suspicion for periorbital or orbital cellulitis as patient face had no erythematous, patient EOMs were intact, he had no pain with eye movement.  Low suspicion for mumps as patient states he is fully up-to-date on his childhood vaccines, denies testicular pain.  Plan-suspect patient's facial swelling is secondary due to dental infection, will start him on antibiotics, refer him to a dentist for further evaluation.  Vital signs have remained stable, no indication for hospital admission.  Patient discussed with attending and they agreed with assessment and plan.  Patient given at home care as well strict return precautions.  Patient verbalized that they understood agreed to said plan.   Final Clinical Impression(s) / ED Diagnoses Final diagnoses:  Facial swelling  Dental infection    Rx / DC Orders ED Discharge Orders         Ordered    penicillin v potassium (VEETID) 500 MG tablet  4 times daily         06/22/20 1043           Barnie Del 06/22/20 1046    Bethann Berkshire, MD 06/24/20 1731

## 2020-06-22 NOTE — Discharge Instructions (Addendum)
Suspect you have a dental infection.  Started you on antibiotics please take as prescribed.  You may take over-the-counter pain medications like ibuprofen and/or Tylenol every 6 hours as needed please follow dosage and on the back of bottle.  I would like you to follow-up with a dentist for further evaluation.  Giving you the contact information above please call, also given the additional resources for finding a dentist within the area.  Please review.  Come back to the emergency department if you develop chest pain, shortness of breath, severe abdominal pain, uncontrolled nausea, vomiting, diarrhea.

## 2020-06-22 NOTE — ED Triage Notes (Signed)
Presents for complaints of right sided facial swelling that began 2 days ago - has just gotten worse Denies pain  Reports he has attempted otc motrin for swelling  Other oral membranes intact Denies teeth pain  Last seen dentist many years ago

## 2020-08-15 ENCOUNTER — Ambulatory Visit: Payer: Self-pay | Admitting: Physician Assistant

## 2020-08-15 ENCOUNTER — Encounter: Payer: Self-pay | Admitting: Physician Assistant

## 2020-08-15 VITALS — BP 122/74 | HR 74 | Temp 97.6°F

## 2020-08-15 DIAGNOSIS — K029 Dental caries, unspecified: Secondary | ICD-10-CM

## 2020-08-15 DIAGNOSIS — F1911 Other psychoactive substance abuse, in remission: Secondary | ICD-10-CM

## 2020-08-15 DIAGNOSIS — K0889 Other specified disorders of teeth and supporting structures: Secondary | ICD-10-CM

## 2020-08-15 MED ORDER — AMOXICILLIN 500 MG PO CAPS: 500.0000 mg | ORAL_CAPSULE | Freq: Three times a day (TID) | ORAL | 0 refills | Status: DC

## 2020-08-15 NOTE — Progress Notes (Signed)
   BP 122/74   Pulse 74   Temp 97.6 F (36.4 C)   SpO2 98%    Subjective:    Patient ID: Nathan Koch, male    DOB: 09-Feb-1991, 30 y.o.   MRN: [id number]  HPI: Nathan Koch is a 30 y.o. male presenting on 08/15/2020 for No chief complaint on file.   HPI    Pt had a negative covid 19 screening questionnaire.    Pt is 30yoM who presents for dentalgia.  He is still living at [facility] house.  The tooth started bothering him about a month ago.  He says he has had swelling of the face at times.   He says he got the covid vaccination but doesn't have his card with him.  He says he got the J&J for first dose and then got the booster.   He is very happy to be 7 months clean.   Relevant past medical, surgical, family and social history reviewed and updated as indicated. Interim medical history since our last visit reviewed. Allergies and medications reviewed and updated.   Current Outpatient Medications:  .  albuterol (VENTOLIN HFA) 108 (90 Base) MCG/ACT inhaler, Inhale 2 puffs into the lungs every 6 (six) hours as needed for wheezing or shortness of breath., Disp: 3 each, Rfl: 1 .  QUEtiapine (SEROQUEL) 100 MG tablet, Take 1 tablet (100 mg total) by mouth at bedtime., Disp: 30 tablet, Rfl: 0   Review of Systems  Per HPI unless specifically indicated above     Objective:    BP 122/74   Pulse 74   Temp 97.6 F (36.4 C)   SpO2 98%   Wt Readings from Last 3 Encounters:  06/22/20 206 lb (93.4 kg)  04/09/20 228 lb (103.4 kg)  01/25/20 212 lb (96.2 kg)    Physical Exam Constitutional:      General: He is not in acute distress.    Appearance: He is not toxic-appearing.  HENT:     Head: Normocephalic and atraumatic.     Mouth/Throat:     Dentition: Abnormal dentition. Dental tenderness and dental caries present.     Comments: Right lower teeth with decay, one tooth appears to be missing and another broken in half.  Some of the decayed teeth have black areas.  There is no  visible abscess for draining.  The teeth are tender.  There is swelling of the face over the right mandible.  Pt is talking normally and there is no drooling, no trismus. Pulmonary:     Effort: Pulmonary effort is normal. No respiratory distress.  Skin:    General: Skin is warm and dry.  Neurological:     Mental Status: He is alert and oriented to person, place, and time.  Psychiatric:        Attention and Perception: Attention normal.        Speech: Speech normal.        Behavior: Behavior normal. Behavior is cooperative.             Assessment & Plan:    Encounter Diagnoses  Name Primary?  Norva Riffle Yes  . Dental decay   . Substance abuse in remission ([name])        -rx amoxil.  Pt counseled on dental care and given handout -Pt is put on dental list.  -He has follow up appointment in September and is to call sooner if needed.

## 2020-08-15 NOTE — Patient Instructions (Signed)
Dental Pain Dental pain is often a sign that something is wrong with your teeth or gums. You can also have pain after a dental treatment. If you have dental pain, it is important to contact your dentist, especially if the cause of the pain is not known. Dental pain may hurt a lot or a little and can be caused by many things, including:  Tooth decay (cavities or caries).  Infection.  The inner part of the tooth being filled with pus (an abscess).  Injury.  A crack in the tooth.  Gums that move back and expose the root of a tooth.  Gum disease.  Abnormal grinding or clenching of teeth.  Not taking good care of your teeth. Sometimes the cause of pain is not known. You may have pain all the time, or it may happen only when you are:  Chewing.  Exposed to hot or cold temperatures.  Eating or drinking foods or drinks that have a lot of sugar in them, such as soda or candy. Follow these instructions at home: Medicines  Take over-the-counter and prescription medicines only as told by your dentist.  If you were prescribed an antibiotic medicine, take it as told by your dentist. Do not stop taking it even if you start to feel better. Eating and drinking Do not eat foods or drinks that cause you pain. These include:  Very hot or very cold foods or drinks.  Sweet or sugary foods or drinks. Managing pain and swelling  If told, put ice on the painful area of your face. To do this: ? Put ice in a plastic bag. ? Place a towel between your skin and the bag. ? Leave the ice on for 20 minutes, 2-3 times a day. ? Take off the ice if your skin turns bright red. This is very important. If you cannot feel pain, heat, or cold, you have a greater risk of damage to the area.   Brushing your teeth  Brush your teeth twice a day using a fluoride toothpaste.  Use a toothpaste made for sensitive teeth as told by your dentist.  Use a soft toothbrush. General instructions  Floss your teeth at  least once a day.  Do not put heat on the outside of your face.  Rinse your mouth often with salt water. To make salt water, dissolve -1 tsp (3-6 g) of salt in 1 cup (237 mL) of warm water.  Watch your dental pain. Let your dentist know if there are any changes.  Keep all follow-up visits. Contact a dentist if:  You have dental pain and you do not know why.  Medicine does not help your pain.  Your symptoms get worse.  You have new symptoms. Get help right away if:  You cannot open your mouth.  You are having trouble breathing or swallowing.  You have a fever.  Your face, neck, or jaw is swollen. These symptoms may be an emergency. Get help right away. Call your local emergency services (911 in the [place]  Do not wait to see if the symptoms will go away.  Do not drive yourself to the hospital. Summary  Dental pain may be caused by many things, including tooth decay, injury, or infection. In some cases, the cause is not known.  Dental pain may hurt a lot or very little. You may have pain all the time, or you may have it only when you eat or drink.  Take over-the-counter and prescription medicines only as told   by your dentist.  Watch your dental pain for any changes. Let your dentist know if symptoms get worse. This information is not intended to replace advice given to you by your health care provider. Make sure you discuss any questions you have with your health care provider. Document Revised: 01/17/2020 Document Reviewed: 01/17/2020 Elsevier Patient Education  2021 [employer]  

## 2020-09-09 ENCOUNTER — Other Ambulatory Visit: Payer: Self-pay

## 2020-09-09 ENCOUNTER — Emergency Department (HOSPITAL_COMMUNITY)
Admission: EM | Admit: 2020-09-09 | Discharge: 2020-09-10 | Disposition: A | Discharge status: Home / Self Care | Payer: Federal, State, Local not specified - Other | Attending: Emergency Medicine | Admitting: Emergency Medicine

## 2020-09-09 ENCOUNTER — Encounter (HOSPITAL_COMMUNITY): Payer: Self-pay | Admitting: *Deleted

## 2020-09-09 DIAGNOSIS — F332 Major depressive disorder, recurrent severe without psychotic features: Secondary | ICD-10-CM | POA: Insufficient documentation

## 2020-09-09 DIAGNOSIS — Y909 Presence of alcohol in blood, level not specified: Secondary | ICD-10-CM | POA: Insufficient documentation

## 2020-09-09 DIAGNOSIS — F1524 Other stimulant dependence with stimulant-induced mood disorder: Secondary | ICD-10-CM | POA: Insufficient documentation

## 2020-09-09 DIAGNOSIS — R000 Tachycardia, unspecified: Secondary | ICD-10-CM | POA: Insufficient documentation

## 2020-09-09 DIAGNOSIS — R45851 Suicidal ideations: Secondary | ICD-10-CM | POA: Insufficient documentation

## 2020-09-09 DIAGNOSIS — J45909 Unspecified asthma, uncomplicated: Secondary | ICD-10-CM | POA: Insufficient documentation

## 2020-09-09 DIAGNOSIS — F17210 Nicotine dependence, cigarettes, uncomplicated: Secondary | ICD-10-CM | POA: Insufficient documentation

## 2020-09-09 DIAGNOSIS — Z20822 Contact with and (suspected) exposure to covid-19: Secondary | ICD-10-CM | POA: Insufficient documentation

## 2020-09-09 LAB — CBC WITH DIFFERENTIAL/PLATELET
Abs Immature Granulocytes: 0.03 10*3/uL (ref 0.00–0.07)
Basophils Absolute: 0.1 10*3/uL (ref 0.0–0.1)
Basophils Relative: 0 %
Eosinophils Absolute: 0 10*3/uL (ref 0.0–0.5)
Eosinophils Relative: 0 %
HCT: 49.1 % (ref 39.0–52.0)
Hemoglobin: 16.2 g/dL (ref 13.0–17.0)
Immature Granulocytes: 0 %
Lymphocytes Relative: 19 %
Lymphs Abs: 2.3 10*3/uL (ref 0.7–4.0)
MCH: 28.3 pg (ref 26.0–34.0)
MCHC: 33 g/dL (ref 30.0–36.0)
MCV: 85.7 fL (ref 80.0–100.0)
Monocytes Absolute: 1.1 10*3/uL — ABNORMAL HIGH (ref 0.1–1.0)
Monocytes Relative: 9 %
Neutro Abs: 8.4 10*3/uL — ABNORMAL HIGH (ref 1.7–7.7)
Neutrophils Relative %: 72 %
Platelets: 418 10*3/uL — ABNORMAL HIGH (ref 150–400)
RBC: 5.73 MIL/uL (ref 4.22–5.81)
RDW: 13.5 % (ref 11.5–15.5)
WBC: 12 10*3/uL — ABNORMAL HIGH (ref 4.0–10.5)
nRBC: 0 % (ref 0.0–0.2)

## 2020-09-09 LAB — COMPREHENSIVE METABOLIC PANEL
ALT: 39 U/L (ref 0–44)
AST: 76 U/L — ABNORMAL HIGH (ref 15–41)
Albumin: 5 g/dL (ref 3.5–5.0)
Alkaline Phosphatase: 74 U/L (ref 38–126)
Anion gap: 10 (ref 5–15)
BUN: 19 mg/dL (ref 6–20)
CO2: 21 mmol/L — ABNORMAL LOW (ref 22–32)
Calcium: 10.2 mg/dL (ref 8.9–10.3)
Chloride: 105 mmol/L (ref 98–111)
Creatinine, Ser: 1.26 mg/dL — ABNORMAL HIGH (ref 0.61–1.24)
GFR, Estimated: >60 mL/min (ref >60)
Glucose, Bld: 115 mg/dL — ABNORMAL HIGH (ref 70–99)
Potassium: 3.8 mmol/L (ref 3.5–5.1)
Sodium: 136 mmol/L (ref 135–145)
Total Bilirubin: 2.1 mg/dL — ABNORMAL HIGH (ref 0.3–1.2)
Total Protein: 9.5 g/dL — ABNORMAL HIGH (ref 6.5–8.1)

## 2020-09-09 LAB — ETHANOL: Alcohol, Ethyl (B): <10 mg/dL (ref <10)

## 2020-09-09 LAB — RAPID URINE DRUG SCREEN, HOSP PERFORMED
Amphetamines: POSITIVE — AB
Barbiturates: NOT DETECTED
Benzodiazepines: NOT DETECTED
Cocaine: NOT DETECTED
Opiates: NOT DETECTED
Tetrahydrocannabinol: POSITIVE — AB

## 2020-09-09 LAB — SALICYLATE LEVEL: Salicylate Lvl: <7 mg/dL — ABNORMAL LOW (ref 7.0–30.0)

## 2020-09-09 LAB — ACETAMINOPHEN LEVEL: Acetaminophen (Tylenol), Serum: <10 ug/mL — ABNORMAL LOW (ref 10–30)

## 2020-09-09 MED ORDER — QUETIAPINE FUMARATE 100 MG PO TABS
100.0000 mg | ORAL_TABLET | Freq: Every day | ORAL | Status: DC
  Administered 2020-09-09 – 2020-09-10 (×2): 100 mg via ORAL
  Filled 2020-09-09 (×2): qty 1

## 2020-09-09 MED ORDER — ACETAMINOPHEN 500 MG PO TABS: 500.0000 mg | ORAL_TABLET | Freq: Four times a day (QID) | ORAL | Status: DC | PRN

## 2020-09-09 MED ORDER — SODIUM CHLORIDE 0.9 % IV BOLUS
1000.0000 mL | Freq: Once | INTRAVENOUS | Status: AC
  Administered 2020-09-09: 1000 mL via INTRAVENOUS

## 2020-09-09 NOTE — ED Triage Notes (Signed)
Pt here wanting help to detox from meth.  Last used 2 days ago.  Denies any other substances or ETOH.  Admits to SI but no plan.

## 2020-09-09 NOTE — ED Notes (Signed)
Tele sitter set up at this time.
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
## 2020-09-09 NOTE — ED Triage Notes (Signed)
Security called for weapons to lock up and to wand pt in triage.

## 2020-09-09 NOTE — ED Provider Notes (Signed)
[facility] EMERGENCY DEPARTMENT Provider Note   CSN: [id number] Arrival date & time: 09/09/20  1548     History Chief Complaint  Patient presents with  . detox    Nathan Koch is a 30 y.o. male.  HPI   30 year old male with history of anxiety, asthma, bipolar disorder, depression, methamphetamine abuse, PTSD, who presents to the emergency department today for evaluation for detox.  States that he was 8 months clean but he recently relapsed and started using methamphetamines.  States he smokes and he does not inject.  He is not injected for about a year.  He is requesting admission to a detox facility.  He also reports suicidal ideations.  He denies any plan.  Denies any homicidal ideations.  Denies AVH.  His last use was earlier tonight.  He denies alcohol use or other drug use besides marijuana. Denies fevers chills  Past Medical History:  Diagnosis Date  . Anxiety   . Anxiety disorder   . Asthma   . Bipolar 1 disorder ([name])   . Depression   . Methamphetamine abuse ([name])   . PTSD (post-traumatic stress disorder)     Patient Active Problem List   Diagnosis Date Noted  . 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 MDD (major depressive disorder), recurrent, severe, with psychosis ([name]) 09/14/2019  . MDD (major depressive disorder), single episode, severe with psychosis ([name]) 09/14/2019  . Asthma 06/26/2014    Past Surgical History:  Procedure Laterality Date  . CLOSED REDUCTION MANDIBLE N/A 07/02/2018   Procedure: CLOSED REDUCTION MANDIBULAR;  Surgeon: Vernie Murders, MD;  Location: [facility] ORS;  Service: ENT;  Laterality: N/A;       Family History  Problem Relation Age of Onset  . Asthma Mother   . Cancer Mother        breast cancer  . Ulcers Mother   . Cancer Father        esophogeal cancer  . Hypertension Father   . Asthma Brother     Social History   Tobacco Use  . Smoking status:  Current Every Day Smoker    Packs/day: 0.50    Years: 8.00    Pack years: 4.00    Types: Cigarettes  . Smokeless tobacco: Never Used  Vaping Use  . Vaping Use: Every day  Substance Use Topics  . Alcohol use: Not Currently    Alcohol/week: 7.0 standard drinks    Types: 7 Standard drinks or equivalent per week    Comment: previously heavy drinker 2016. most recent 4 beer/ day drinker and none since 01-11-2020  . Drug use: Yes    Types: Methamphetamines    Comment: last used 09/07/20    Home Medications Prior to Admission medications   Medication Sig Start Date End Date Taking? Authorizing Provider  acetaminophen (TYLENOL) 500 MG tablet Take 500 mg by mouth every 6 (six) hours as needed for headache or moderate pain (back pain, headache).   Yes [provider]  QUEtiapine (SEROQUEL) 100 MG tablet Take 1 tablet (100 mg total) by mouth at bedtime. 10/17/19  Yes Clapacs, Jackquline Denmark, MD  albuterol (VENTOLIN HFA) 108 (90 Base) MCG/ACT inhaler Inhale 2 puffs into the lungs every 6 (six) hours as needed for wheezing or shortness of breath. Patient not taking: Reported on 09/09/2020 05/27/20   Jacquelin Hawking, PA-C  amoxicillin (AMOXIL) 500 MG capsule Take 1 capsule (500 mg total) by mouth 3 (three) times daily.  Patient not taking: No sig reported 08/15/20   Jacquelin Hawking, PA-C    Allergies    Patient has no known allergies.  Review of Systems   Review of Systems  Constitutional: Negative for fever.  HENT: Negative for ear pain and sore throat.   Eyes: Negative for visual disturbance.  Respiratory: Negative for shortness of breath.   Cardiovascular: Negative for chest pain.  Gastrointestinal: Negative for abdominal pain, constipation, diarrhea, nausea and vomiting.  Genitourinary: Negative for dysuria and hematuria.  Musculoskeletal: Negative for myalgias.  Skin: Negative for rash.  Neurological: Negative for headaches.  Psychiatric/Behavioral: Positive for suicidal ideas.  All  other systems reviewed and are negative.   Physical Exam Updated Vital Signs BP 119/76 (BP Location: Right Arm)   Pulse 81   Temp 98.4 F (36.9 C)   Resp 16   Ht 5\' 9"  (1.753 m)   Wt 95.3 kg   SpO2 99%   BMI 31.01 kg/m   Physical Exam Vitals and nursing note reviewed.  Constitutional:      Appearance: He is well-developed.  HENT:     Head: Normocephalic and atraumatic.  Eyes:     Conjunctiva/sclera: Conjunctivae normal.  Cardiovascular:     Rate and Rhythm: Regular rhythm. Tachycardia present.     Heart sounds: Normal heart sounds. No murmur heard.   Pulmonary:     Effort: Pulmonary effort is normal. No respiratory distress.     Breath sounds: Normal breath sounds. No wheezing, rhonchi or rales.  Abdominal:     General: Bowel sounds are normal.     Palpations: Abdomen is soft.     Tenderness: There is no abdominal tenderness. There is no guarding or rebound.  Musculoskeletal:     Cervical back: Neck supple.  Skin:    General: Skin is warm and dry.  Neurological:     Mental Status: He is alert.  Psychiatric:        Thought Content: Thought content includes suicidal ideation. Thought content does not include suicidal plan.     ED Results / Procedures / Treatments   Labs (all labs ordered are listed, but only abnormal results are displayed) Labs Reviewed  COMPREHENSIVE METABOLIC PANEL - Abnormal; Notable for the following components:      Result Value   CO2 21 (*)    Glucose, Bld 115 (*)    Creatinine, Ser 1.26 (*)    Total Protein 9.5 (*)    AST 76 (*)    Total Bilirubin 2.1 (*)    All other components within normal limits  ACETAMINOPHEN LEVEL - Abnormal; Notable for the following components:   Acetaminophen (Tylenol), Serum <10 (*)    All other components within normal limits  SALICYLATE LEVEL - Abnormal; Notable for the following components:   Salicylate Lvl <7.0 (*)    All other components within normal limits  CBC WITH DIFFERENTIAL/PLATELET -  Abnormal; Notable for the following components:   WBC 12.0 (*)    Platelets 418 (*)    Neutro Abs 8.4 (*)    Monocytes Absolute 1.1 (*)    All other components within normal limits  RAPID URINE DRUG SCREEN, HOSP PERFORMED - Abnormal; Notable for the following components:   Amphetamines POSITIVE (*)    Tetrahydrocannabinol POSITIVE (*)    All other components within normal limits  RESP PANEL BY RT-PCR (FLU A&B, COVID) ARPGX2  ETHANOL    EKG None  Radiology No results found.  Procedures Procedures   Medications Ordered in  ED Medications  acetaminophen (TYLENOL) tablet 500 mg (has no administration in time range)  QUEtiapine (SEROQUEL) tablet 100 mg (100 mg Oral Given 09/09/20 2050)  sodium chloride 0.9 % bolus 1,000 mL (0 mLs Intravenous Stopped 09/09/20 2159)    ED Course  I have reviewed the triage vital signs and the nursing notes.  Pertinent labs & imaging results that were available during my care of the patient were reviewed by me and considered in my medical decision making (see chart for details).    MDM Rules/Calculators/A&P                          30 year old male presenting the emergency department today for evaluation for detox.  Also complaining of SI.  Initial heart rate is elevated, likely from recent methamphetamine use.  IV fluids ordered and on reassessment heart rate is normal.  CBC shows a mild leukocytosis which patient has had in the past after using methamphetamine CBC shows mildly elevated creatinine, mildly elevated total protein, mildly elevated AST and mildly elevated total bilirubin which appears baseline EtOH, acetaminophen level and salicylate level negative UDS positive for amphetamines and THC  Patient does not appear to have any emergent medical problems at this time that would require further work-up or admission to the hospital.  He is appropriate for TTS.  Behavioral health evaluated the patient and recommends inpatient  treatment.  The patient has been placed in psychiatric observation due to the need to provide a safe environment for the patient while obtaining psychiatric consultation and evaluation, as well as ongoing medical and medication management to treat the patient's condition.  The patient has not been placed under full IVC at this time. Care transitioned to default provider.    Final Clinical Impression(s) / ED Diagnoses Final diagnoses:  Suicidal ideation    Rx / DC Orders ED Discharge Orders    None       Karrie Meres, PA-C 09/09/20 2240    Mancel Bale, MD 09/10/20 [phone]

## 2020-09-09 NOTE — ED Notes (Signed)
Pt has been placed in maroon scrubs, all belonging have been locked up, and he is in his room.

## 2020-09-10 ENCOUNTER — Inpatient Hospital Stay (HOSPITAL_COMMUNITY)
Admission: RE | Admit: 2020-09-10 | Discharge: 2020-09-14 | DRG: 885 | Disposition: A | Discharge status: Home / Self Care | Payer: Federal, State, Local not specified - Other | Source: Intra-hospital | Attending: Psychiatry | Admitting: Psychiatry

## 2020-09-10 DIAGNOSIS — Z9151 Personal history of suicidal behavior: Secondary | ICD-10-CM

## 2020-09-10 DIAGNOSIS — F332 Major depressive disorder, recurrent severe without psychotic features: Secondary | ICD-10-CM | POA: Diagnosis present

## 2020-09-10 DIAGNOSIS — F4310 Post-traumatic stress disorder, unspecified: Secondary | ICD-10-CM | POA: Diagnosis present

## 2020-09-10 DIAGNOSIS — Z803 Family history of malignant neoplasm of breast: Secondary | ICD-10-CM

## 2020-09-10 DIAGNOSIS — R45851 Suicidal ideations: Secondary | ICD-10-CM

## 2020-09-10 DIAGNOSIS — Z9152 Personal history of nonsuicidal self-harm: Secondary | ICD-10-CM | POA: Diagnosis not present

## 2020-09-10 DIAGNOSIS — Z825 Family history of asthma and other chronic lower respiratory diseases: Secondary | ICD-10-CM | POA: Diagnosis not present

## 2020-09-10 DIAGNOSIS — Z79899 Other long term (current) drug therapy: Secondary | ICD-10-CM | POA: Diagnosis not present

## 2020-09-10 DIAGNOSIS — F1513 Other stimulant abuse with withdrawal: Secondary | ICD-10-CM | POA: Diagnosis present

## 2020-09-10 DIAGNOSIS — F17210 Nicotine dependence, cigarettes, uncomplicated: Secondary | ICD-10-CM | POA: Diagnosis present

## 2020-09-10 DIAGNOSIS — Z8249 Family history of ischemic heart disease and other diseases of the circulatory system: Secondary | ICD-10-CM

## 2020-09-10 DIAGNOSIS — Z818 Family history of other mental and behavioral disorders: Secondary | ICD-10-CM | POA: Diagnosis not present

## 2020-09-10 DIAGNOSIS — F1520 Other stimulant dependence, uncomplicated: Secondary | ICD-10-CM | POA: Diagnosis not present

## 2020-09-10 LAB — RESP PANEL BY RT-PCR (FLU A&B, COVID) ARPGX2
Influenza A by PCR: NEGATIVE
Influenza B by PCR: NEGATIVE
SARS Coronavirus 2 by RT PCR: NEGATIVE

## 2020-09-10 NOTE — Consult Note (Signed)
Telepsych Consultation   Reason for Consult:  Psychiatric reassessment for suicidal ideations.  Referring Physician:  Dr. Meridee ScoreMichael Butler Location of Patient: [facility] Location of Provider: [facility]  Patient Identification: Nathan RomansMickey L Hatler MRN:  [id number] Principal Diagnosis: MDD (major depressive disorder), recurrent severe, without psychosis ([name]) Diagnosis:  Principal Problem:   MDD (major depressive disorder), recurrent severe, without psychosis ([name]) Active Problems:   Suicidal ideation   Total Time spent with patient: 20 minutes  Subjective:   Nathan Koch is a 30 y.o. male patient whom presented to [facility] due to a recent relapse on amphetamine and suicidal ideations.  Nathan RomansMickey L Pickrel, 30 y.o., male patient seen via tele health by this provider, consulted with Dr. Lucianne MussKumar; and chart reviewed on 09/10/20.    HPI:    During evaluation Dianna RossettiMickey L Dan HumphreysWalker is currently laying in bed sleeping. Patient is awakened and disheveled. He sits up in bed and is in no acute distress. He makes good eye contact. Speech is clear, normal rate and volume. He is alert, oriented x 4, calm and cooperative. His anxious and reports his mood is depressed with congruent affect. States, "I feel hopeless". Reports decreased sleep and appetite. He does not appear to be responding to internal/external stimuli or delusional thoughts.  Patient denies homicidal ideation, psychosis, and paranoia.  Denies any auditory or visual hallucinations.  Patient endorses suicidal ideations with intent and a plan in place.  States, "I plan to jump in front of a car".  States he feels hopeless, and does not feel like living anymore.  States he was clean for 8 months in a residential treatment center and was doing "good".  Reports he has had 2 inpatient psychiatric admissions related to methamphetamine use and suicidal ideations.  Reports no outpatient services in place.  Reports he currently does not take any medications.  States he came home from RTC and within 3 days started using meth again.  States he was living with his girlfriend and her mother and they encouraged use of methamphetamine.  Reports he cannot go back there due to the drug use environement.  Reports he smokes weed occasionally.  States he was using 1 g a day of methamphetamines, equivalent to $60 a day.  Reports he has a job and his employer is aware of his situation.  Reports he has access to knives, no access to firearms. States, "I have access to meth I could overdose on".  Patient reports he has no social support.  States both of his parents are deceased and his brother committed suicide with an overdose of heroin.  States, "I have no one, no family left".  Patient will not contract for safety.  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 Anxiety   . Anxiety disorder   . Asthma   . Bipolar 1 disorder ([name])   . Depression   . Methamphetamine abuse ([name])   . PTSD (post-traumatic stress disorder)     Past Surgical History:  Procedure Laterality Date  . CLOSED REDUCTION MANDIBLE N/A 07/02/2018   Procedure: CLOSED REDUCTION MANDIBULAR;  Surgeon: Vernie MurdersJuengel, Paul, MD;  Location: [facility] ORS;  Service: ENT;  Laterality: N/A;   Family History:  Family History  Problem Relation Age of Onset  .  Asthma Mother   . Cancer Mother        breast cancer  . Ulcers Mother   . Cancer Father        esophogeal cancer  . Hypertension Father   . Asthma Brother    Family Psychiatric  History: brother committed suicide by overdose of heroin Social History:  Social History   Substance and Sexual Activity  Alcohol Use Not Currently  . Alcohol/week: 7.0 standard drinks  . Types: 7 Standard drinks or  equivalent per week   Comment: previously heavy drinker 2016. most recent 4 beer/ day drinker and none since 01-11-2020     Social History   Substance and Sexual Activity  Drug Use Yes  . Types: Methamphetamines   Comment: last used 09/07/20    Social History   Socioeconomic History  . Marital status: Single    Spouse name: Not on file  . Number of children: Not on file  . Years of education: Not on file  . Highest education level: Not on file  Occupational History  . Not on file  Tobacco Use  . Smoking status: Current Every Day Smoker    Packs/day: 0.50    Years: 8.00    Pack years: 4.00    Types: Cigarettes  . Smokeless tobacco: Never Used  Vaping Use  . Vaping Use: Every day  Substance and Sexual Activity  . Alcohol use: Not Currently    Alcohol/week: 7.0 standard drinks    Types: 7 Standard drinks or equivalent per week    Comment: previously heavy drinker 2016. most recent 4 beer/ day drinker and none since 01-11-2020  . Drug use: Yes    Types: Methamphetamines    Comment: last used 09/07/20  . Sexual activity: Not on file  Other Topics Concern  . Not on file  Social History Narrative  . Not on file   Social Determinants of Health   Financial Resource Strain: Not on file  Food Insecurity: Not on file  Transportation Needs: Not on file  Physical Activity: Not on file  Stress: Not on file  Social Connections: Not on file   Additional Social History:    Allergies:  No Known Allergies  Labs:  Results for orders placed or performed during the hospital encounter of 09/09/20 (from the past 48 hour(s))  Urine rapid drug screen (hosp performed)     Status: Abnormal   Collection Time: 09/09/20  4:34 PM  Result Value Ref Range   Opiates NONE DETECTED NONE DETECTED   Cocaine NONE DETECTED NONE DETECTED   Benzodiazepines NONE DETECTED NONE DETECTED   Amphetamines POSITIVE (A) NONE DETECTED   Tetrahydrocannabinol POSITIVE (A) NONE DETECTED   Barbiturates NONE  DETECTED NONE DETECTED    Comment: (NOTE) DRUG SCREEN FOR MEDICAL PURPOSES ONLY.  IF CONFIRMATION IS NEEDED FOR ANY PURPOSE, NOTIFY LAB WITHIN 5 DAYS.  LOWEST DETECTABLE LIMITS FOR URINE DRUG SCREEN Drug Class                     Cutoff (ng/mL) Amphetamine and metabolites    1000 Barbiturate and metabolites    200 Benzodiazepine                 200 Tricyclics and metabolites     300 Opiates and metabolites        300 Cocaine and metabolites        300 THC  50 Performed at [facility], [address], [place], [place] [address]   Comprehensive metabolic panel     Status: Abnormal   Collection Time: 09/09/20  4:50 PM  Result Value Ref Range   Sodium 136 135 - 145 mmol/L   Potassium 3.8 3.5 - 5.1 mmol/L   Chloride 105 98 - 111 mmol/L   CO2 21 (L) 22 - 32 mmol/L   Glucose, Bld 115 (H) 70 - 99 mg/dL    Comment: Glucose reference range applies only to samples taken after fasting for at least 8 hours.   BUN 19 6 - 20 mg/dL   Creatinine, Ser 8.10 (H) 0.61 - 1.24 mg/dL   Calcium 17.5 8.9 - 10.2 mg/dL   Total Protein 9.5 (H) 6.5 - 8.1 g/dL   Albumin 5.0 3.5 - 5.0 g/dL   AST 76 (H) 15 - 41 U/L   ALT 39 0 - 44 U/L   Alkaline Phosphatase 74 38 - 126 U/L   Total Bilirubin 2.1 (H) 0.3 - 1.2 mg/dL   GFR, Estimated >58 >52 mL/min    Comment: (NOTE) Calculated using the CKD-EPI Creatinine Equation (2021)    Anion gap 10 5 - 15    Comment: Performed at [facility], [address], [place], [place] [address]  Acetaminophen level     Status: Abnormal   Collection Time: 09/09/20  4:50 PM  Result Value Ref Range   Acetaminophen (Tylenol), Serum <10 (L) 10 - 30 ug/mL    Comment: (NOTE) Therapeutic concentrations vary significantly. A range of 10-30 ug/mL  may be an effective concentration for many patients. However, some  are best treated at concentrations outside of this range. Acetaminophen concentrations >150 ug/mL at 4 hours after ingestion  and >50 ug/mL at  12 hours after ingestion are often associated with  toxic reactions.  Performed at [facility], [address] [place], [place] [address]   Ethanol     Status: None   Collection Time: 09/09/20  4:50 PM  Result Value Ref Range   Alcohol, Ethyl (B) <10 <10 mg/dL    Comment: (NOTE) Lowest detectable limit for serum alcohol is 10 mg/dL.  For medical purposes only. Performed at [facility], [address], [place], [place] [address]   Salicylate level     Status: Abnormal   Collection Time: 09/09/20  4:50 PM  Result Value Ref Range   Salicylate Lvl <7.0 (L) 7.0 - 30.0 mg/dL    Comment: Performed at [facility], [address], [place], [place] [address]  CBC with Differential     Status: Abnormal   Collection Time: 09/09/20  4:50 PM  Result Value Ref Range   WBC 12.0 (H) 4.0 - 10.5 K/uL   RBC 5.73 4.22 - 5.81 MIL/uL   Hemoglobin 16.2 13.0 - 17.0 g/dL   HCT 67.6 19.5 - 09.3 %   MCV 85.7 80.0 - 100.0 fL   MCH 28.3 26.0 - 34.0 pg   MCHC 33.0 30.0 - 36.0 g/dL   RDW 26.7 12.4 - 58.0 %   Platelets 418 (H) 150 - 400 K/uL   nRBC 0.0 0.0 - 0.2 %   Neutrophils Relative % 72 %   Neutro Abs 8.4 (H) 1.7 - 7.7 K/uL   Lymphocytes Relative 19 %   Lymphs Abs 2.3 0.7 - 4.0 K/uL   Monocytes Relative 9 %   Monocytes Absolute 1.1 (H) 0.1 - 1.0 K/uL   Eosinophils Relative 0 %   Eosinophils Absolute 0.0 0.0 -  0.5 K/uL   Basophils Relative 0 %   Basophils Absolute 0.1 0.0 - 0.1 K/uL   Immature Granulocytes 0 %   Abs Immature Granulocytes 0.03 0.00 - 0.07 K/uL    Comment: Performed at [facility], [address] [place], [place] [address]    Medications:  Current Facility-Administered Medications  Medication Dose Route Frequency Provider Last Rate Last Admin  . acetaminophen (TYLENOL) tablet 500 mg  500 mg Oral Q6H PRN Couture, Cortni S, PA-C      . QUEtiapine (SEROQUEL) tablet 100 mg  100 mg Oral QHS Couture, Cortni S, PA-C   100 mg at 09/09/20 2050   Current Outpatient Medications   Medication Sig Dispense Refill  . acetaminophen (TYLENOL) 500 MG tablet Take 500 mg by mouth every 6 (six) hours as needed for headache or moderate pain (back pain, headache).    . QUEtiapine (SEROQUEL) 100 MG tablet Take 1 tablet (100 mg total) by mouth at bedtime. 30 tablet 0  . albuterol (VENTOLIN HFA) 108 (90 Base) MCG/ACT inhaler Inhale 2 puffs into the lungs every 6 (six) hours as needed for wheezing or shortness of breath. (Patient not taking: Reported on 09/09/2020) 3 each 1  . amoxicillin (AMOXIL) 500 MG capsule Take 1 capsule (500 mg total) by mouth 3 (three) times daily. (Patient not taking: No sig reported) 30 capsule 0    Musculoskeletal: Strength & Muscle Tone: within normal limits Gait & Station: normal Patient leans: N/A  Psychiatric Specialty Exam: Physical Exam  Review of Systems  Blood pressure 120/73, pulse 70, temperature 97.7 F (36.5 C), temperature source Oral, resp. rate 19, height 5\' 9"  (1.753 m), weight 95.3 kg, SpO2 99 %.Body mass index is 31.01 kg/m.  General Appearance: Disheveled  Eye Contact:  Good  Speech:  Clear and Coherent and Normal Rate  Volume:  Normal  Mood:  Anxious, Depressed and Hopeless  Affect:  Congruent  Thought Process:  Coherent  Orientation:  Full (Time, Place, and Person)  Thought Content:  Logical  Suicidal Thoughts:  Yes.  with intent/plan  Homicidal Thoughts:  No  Memory:  Immediate;   Good Recent;   Good Remote;   Good  Judgement:  Poor  Insight:  Lacking  Psychomotor Activity:  Normal  Concentration:  Concentration: Good and Attention Span: Good  Recall:  Good  Fund of Knowledge:  Good  Language:  Good  Akathisia:  No  Handed:  Right  AIMS (if indicated):     Assets:  Communication Skills Desire for Improvement Leisure Time Physical Health Resilience  ADL's:  Intact  Cognition:  WNL  Sleep:   poor 5 hours     Treatment Plan Summary: Daily contact with patient to assess and evaluate symptoms and progress in  treatment and Medication management  No medication recommendations at this time, Seroquel 100 mg PO QHS was restarted by ED provider on 09/09/2020.  Disposition: Patient continues to meet inpsychiatric Inpatient admission when medically cleared.    This service was provided via telemedicine using a 2-way, interactive audio and video technology.  Names of all persons participating in this telemedicine service and their role in this encounter. Name: 09/11/2020  Role: Patient  Name: Haze Justin  Role: NP  Name: Role:   Name:  Role:     Vernard Gambles, NP 09/10/2020 11:45 AM

## 2020-09-10 NOTE — BH Assessment (Addendum)
Per Lorin Glass, patient accepted to [facility], Room #301-2 at 2200. The accepting provider is Vernard Gambles, NP. The attending provider is Dr. Jola Babinski. He will need a negative Covid PCR prior to [facility] transfer. Patient also to sign voluntary form prior to [facility] transfer.  Patient's nurse (Daphyne, RN) notified and provided the above disposition updates.

## 2020-09-10 NOTE — ED Notes (Signed)
Pt asked for the phone. Pt has used now all 3 phonecalls for the day. Pt is aware that this is his last phone call.

## 2020-09-10 NOTE — ED Notes (Signed)
Pt belongings handed to safe transport

## 2020-09-10 NOTE — ED Notes (Signed)
Pt using the phone for a second time. Pt aware of rules and knows that he is on his second phone out of three. Pt has remained calm and cooperative. Will continue to monitor pt.

## 2020-09-10 NOTE — ED Notes (Signed)
Safe transport called at this time for transport at this time . Nathan Koch

## 2020-09-10 NOTE — ED Notes (Signed)
Pt spoke on the phone. First phonecall of the day.

## 2020-09-10 NOTE — Progress Notes (Signed)
Patient has been referred at the direction of Nelly Rout, MD, Medical Director.   Pt. meets criteria for inpatient treatment per Vernard Gambles, NP. Referred out to the following hospitals:   Destination  Service Provider Address Phone Fax  [facility]  [address] [place] [address] [phone]  [facility]  [address], [place] [address] [phone]  [facility]  [address] [place] [address] [phone]  [facility]  [address] [place] [address] [phone]  [facility]  [address] [place] [address] [phone]  [facility]  [address], [place] [address] [phone]  [facility]  [address] [place] [address] [phone]  [facility]  [address] [place] [address] [phone]     Disposition CSW will continue to follow for placement.  Signed:  Corky Crafts, MSW, [place], LCASA 09/10/2020 3:11 PM

## 2020-09-10 NOTE — ED Notes (Signed)
Pt's dinner sat up and eating their dinner now

## 2020-09-10 NOTE — ED Notes (Addendum)
Report given to Jane at [facility]. 

## 2020-09-10 NOTE — ED Notes (Signed)
Pt's lunch has arrived. Pt sat up and eating lunch

## 2020-09-11 ENCOUNTER — Encounter (HOSPITAL_COMMUNITY): Payer: Self-pay | Admitting: Psychiatry

## 2020-09-11 ENCOUNTER — Other Ambulatory Visit: Payer: Self-pay

## 2020-09-11 DIAGNOSIS — F332 Major depressive disorder, recurrent severe without psychotic features: Secondary | ICD-10-CM | POA: Diagnosis present

## 2020-09-11 LAB — TSH: TSH: 8.743 u[IU]/mL — ABNORMAL HIGH (ref 0.350–4.500)

## 2020-09-11 LAB — LIPID PANEL
Cholesterol: 105 mg/dL (ref 0–200)
HDL: 29 mg/dL — ABNORMAL LOW (ref >40)
LDL Cholesterol: 60 mg/dL (ref 0–99)
Total CHOL/HDL Ratio: 3.6 RATIO
Triglycerides: 80 mg/dL (ref <150)
VLDL: 16 mg/dL (ref 0–40)

## 2020-09-11 LAB — HEMOGLOBIN A1C
Hgb A1c MFr Bld: 6.2 % — ABNORMAL HIGH (ref 4.8–5.6)
Mean Plasma Glucose: 131.24 mg/dL

## 2020-09-11 MED ORDER — HYDROXYZINE HCL 25 MG PO TABS
25.0000 mg | ORAL_TABLET | Freq: Three times a day (TID) | ORAL | Status: DC | PRN
  Administered 2020-09-11: 25 mg via ORAL
  Filled 2020-09-11: qty 1

## 2020-09-11 MED ORDER — ACETAMINOPHEN 325 MG PO TABS: 650.0000 mg | ORAL_TABLET | Freq: Four times a day (QID) | ORAL | Status: DC | PRN

## 2020-09-11 MED ORDER — MAGNESIUM HYDROXIDE 400 MG/5ML PO SUSP: 30.0000 mL | Freq: Every day | ORAL | Status: DC | PRN

## 2020-09-11 MED ORDER — ALBUTEROL SULFATE HFA 108 (90 BASE) MCG/ACT IN AERS
2.0000 | INHALATION_SPRAY | Freq: Four times a day (QID) | RESPIRATORY_TRACT | Status: DC | PRN
  Administered 2020-09-11 – 2020-09-12 (×2): 2 via RESPIRATORY_TRACT
  Filled 2020-09-11: qty 6.7

## 2020-09-11 MED ORDER — GABAPENTIN 100 MG PO CAPS
200.0000 mg | ORAL_CAPSULE | Freq: Three times a day (TID) | ORAL | Status: DC
  Administered 2020-09-11 – 2020-09-14 (×9): 200 mg via ORAL
  Filled 2020-09-11: qty 2
  Filled 2020-09-11 (×3): qty 42
  Filled 2020-09-11 (×11): qty 2

## 2020-09-11 MED ORDER — SERTRALINE HCL 50 MG PO TABS
50.0000 mg | ORAL_TABLET | Freq: Every day | ORAL | Status: DC
Start: 2020-09-11 — End: 2020-09-14
  Administered 2020-09-11 – 2020-09-13 (×3): 50 mg via ORAL
  Filled 2020-09-11: qty 1
  Filled 2020-09-11: qty 7
  Filled 2020-09-11 (×5): qty 1

## 2020-09-11 MED ORDER — QUETIAPINE FUMARATE 100 MG PO TABS
100.0000 mg | ORAL_TABLET | Freq: Every day | ORAL | Status: DC
  Administered 2020-09-11 – 2020-09-13 (×3): 100 mg via ORAL
  Filled 2020-09-11 (×5): qty 1
  Filled 2020-09-11: qty 7

## 2020-09-11 MED ORDER — ALUM & MAG HYDROXIDE-SIMETH 200-200-20 MG/5ML PO SUSP: 30.0000 mL | ORAL | Status: DC | PRN

## 2020-09-11 NOTE — Progress Notes (Signed)
Adult Psychoeducational Group Note  Date:  09/11/2020 Time:  6:58 PM  Group Topic/Focus:  Self Care:   The focus of this group is to help patients understand the importance of self-care in order to improve or restore emotional, physical, spiritual, interpersonal, and financial health.  Participation Level:  Active  Participation Quality:  Appropriate  Affect:  Appropriate  Cognitive:  Appropriate  Insight: Appropriate  Engagement in Group:  Engaged  Modes of Intervention:  Activity  Additional Comments:  Pt watched a therapeutic movie with his peers.  Wynema Birch D 09/11/2020, 6:58 PM

## 2020-09-11 NOTE — Tx Team (Signed)
Initial Treatment Plan 09/11/2020 1:09 AM Nathan Koch XIH:[id number]    PATIENT STRESSORS: Legal issue Marital or family conflict Substance abuse   PATIENT STRENGTHS: Capable of independent living Supportive family/friends Work skills   PATIENT IDENTIFIED PROBLEMS: Substance abuse  Anxiety  "work on my recovery"  "relation with my family"               DISCHARGE CRITERIA:  Ability to meet basic life and health needs Improved stabilization in mood, thinking, and/or behavior Medical problems require only outpatient monitoring Verbal commitment to aftercare and medication compliance  PRELIMINARY DISCHARGE PLAN: Attend aftercare/continuing care group Attend PHP/IOP Attend 12-step recovery group Outpatient therapy Return to previous living arrangement Return to previous work or school arrangements  PATIENT/FAMILY INVOLVEMENT: This treatment plan has been presented to and reviewed with the patient, Nathan Koch, and/or family member.  The patient and family have been given the opportunity to ask questions and make suggestions.  Bethann Punches, RN 09/11/2020, 1:09 AM

## 2020-09-11 NOTE — H&P (Signed)
Psychiatric Admission Assessment Adult  Patient Identification: Nathan Koch MRN:  [id number] Date of Evaluation:  09/11/2020 Chief Complaint:  MDD (major depressive disorder), recurrent episode, severe ([name]) [F33.2] Principal Diagnosis: <principal problem not specified> Diagnosis:  Active Problems:   MDD (major depressive disorder), recurrent episode, severe ([name])  History of Present Illness: Nathan Koch is 30 year old male with a past psychiatric history of depression, anxiety. PTSD, and methamphetamine abuse. This represents the first inpatient admission for this patient. He presented to [facility] with worsening depression and relapse on methamphetamines after 8 months of sobriety.  He lives with his girlfriend/wife and 17 year old daughter. 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 He finished the 9th grade and does not have a GED. He had been at [facility] in [place]. This is a year long program and he left at 8 months because he wanted to go home with his family. His wife also has been clean 1 year from meth. He stated one of his buddies called him and had some meth, both he and his wife used on Friday. His last use was Monday prior to going to the ED.   Patient describes his depressive symptoms as feeling hopeless, worthless, guilt, suicidal ideation, and loss of interest in usual activities. He describes anxiety as feeling restless and worrying about everything. He stated he is sleeping about 9 hours per night but often has difficulty falling and staying asleep. He stated he feels suicidal with a plan to step in front of a bus. He stated he attempted to step into traffic 8 months ago but went to rehab instead. He stated his appetite is good. He denies HI, paranoia, delusions and does not appear to be responding to internal stimuli. He stated his wife and daughter are what he lives for.   He has PTSD from witnessing his only brother/sibling die from a heroin overdose in front of him in  09-01-2011. He called 911 and attempted CPR. His mother passed away in 08-31-12 from breast cancer, his father is deceased from esophageal cancer in 09/01/07. His maternal aunt and grandmother live in [place], he does not get to see them often but does talk to them. He hopes to be able to go back to [facility] from the hospital and if not he can go home. He has been prescribed Seroquel 100 mg PO qhs for sleep by a provider at [facility].  Gabapentin was added today for amphetamine withdrawal. Will order Zoloft 50 mg qhs for depression.   Associated Signs/Symptoms: Depression Symptoms:  depressed mood, fatigue, feelings of worthlessness/guilt, hopelessness, suicidal thoughts with specific plan, anxiety, loss of energy/fatigue, Duration of Depression Symptoms: Less than two weeks  (Hypo) Manic Symptoms:  Patient denies Anxiety Symptoms:  Excessive Worry, restlessness Psychotic Symptoms:  Patient denies PTSD Symptoms: Had a traumatic exposure:  witnessed brothers death from heroin OD in 2012/08/31, performed CPR. Mom died from breast cancer in Sep 01, 2011.  Total Time spent with patient: 45 minutes  Past Psychiatric History: Anxiety, Depression, PTSD, Methamphetamine abuse  Is the patient at risk to self? No.  Has the patient been a risk to self in the past 6 months? Yes.   Has the patient been a risk to self within the distant past? No.  Is the patient a risk to others? No.  Has the patient been a risk to others in the past 6 months? No.  Has the patient been a risk to others within the distant past? No.  Prior Inpatient Therapy:   No Prior Outpatient Therapy:  Yes   Alcohol Screening: Patient refused Alcohol Screening Tool: Yes 1. How often do you have a drink containing alcohol?: 4 or more times a week 2. How many drinks containing alcohol do you have on a typical day when you are drinking?: 10 or more 3. How often do you have six or more drinks on one occasion?: Weekly AUDIT-C Score: 11 4. How often during the  last year have you found that you were not able to stop drinking once you had started?: Less than monthly 5. How often during the last year have you failed to do what was normally expected from you because of drinking?: Less than monthly 6. How often during the last year have you needed a first drink in the morning to get yourself going after a heavy drinking session?: Less than monthly 7. How often during the last year have you had a feeling of guilt of remorse after drinking?: Less than monthly 8. How often during the last year have you been unable to remember what happened the night before because you had been drinking?: Never 9. Have you or someone else been injured as a result of your drinking?: No 10. Has a relative or friend or a doctor or another health worker been concerned about your drinking or suggested you cut down?: No Alcohol Use Disorder Identification Test Final Score (AUDIT): 15 Alcohol Brief Interventions/Follow-up: Alcohol education/Brief advice Substance Abuse History in the last 12 months:  Yes.   Consequences of Substance Abuse: Legal Consequences:  previous legal problems due to drugs, none current Previous Psychotropic Medications: Yes  Psychological Evaluations: No  Past Medical History:  Past Medical History:  Diagnosis Date  . Anxiety   . Anxiety disorder   . Asthma   . Bipolar 1 disorder ([name])   . Depression   . Methamphetamine abuse ([name])   . PTSD (post-traumatic stress disorder)     Past Surgical History:  Procedure Laterality Date  . CLOSED REDUCTION MANDIBLE N/A 07/02/2018   Procedure: CLOSED REDUCTION MANDIBULAR;  Surgeon: Vernie Murders, MD;  Location: [facility] ORS;  Service: ENT;  Laterality: N/A;   Family History:  Family History  Problem Relation Age of Onset  . Asthma Mother   . Cancer Mother        breast cancer  . Ulcers Mother   . Cancer Father        esophogeal cancer  . Hypertension Father   . Asthma Brother    Family Psychiatric   History: Mother-depression, Brother-addiction Tobacco Screening: Have you used any form of tobacco in the last 30 days? (Cigarettes, Smokeless Tobacco, Cigars, and/or Pipes): Yes Tobacco use, Select all that apply: smokeless tobacco use daily Are you interested in Tobacco Cessation Medications?: No, patient refused Counseled patient on smoking cessation including recognizing danger situations, developing coping skills and basic information about quitting provided: Refused/Declined practical counseling Social History:  Social History   Substance and Sexual Activity  Alcohol Use Not Currently  . Alcohol/week: 7.0 standard drinks  . Types: 7 Standard drinks or equivalent per week   Comment: previously heavy drinker 2016. most recent 4 beer/ day drinker and none since 01-11-2020     Social History   Substance and Sexual Activity  Drug Use Yes  . Types: Methamphetamines   Comment: last used 09/07/20    Additional Social History:      Pain Medications: See MAR Prescriptions: See MAR Over the Counter: See [facility]  History of alcohol / drug use?: Yes Negative Consequences of Use: Personal relationships,Legal Withdrawal Symptoms: Agitation,Irritability    Allergies:  No Known Allergies Lab Results:  Results for orders placed or performed during the hospital encounter of 09/10/20 (from the past 48 hour(s))  TSH     Status: Abnormal   Collection Time: 09/11/20  6:29 AM  Result Value Ref Range   TSH 8.743 (H) 0.350 - 4.500 uIU/mL    Comment: Performed by a 3rd Generation assay with a functional sensitivity of <=0.01 uIU/mL. Performed at [facility], [address] [place], [place] [address]   Lipid panel     Status: Abnormal   Collection Time: 09/11/20  6:29 AM  Result Value Ref Range   Cholesterol 105 0 - 200 mg/dL   Triglycerides 80 <786 mg/dL   HDL 29 (L) >76 mg/dL   Total CHOL/HDL Ratio 3.6 RATIO   VLDL 16 0 - 40 mg/dL   LDL Cholesterol 60 0 - 99 mg/dL     Comment:        Total Cholesterol/HDL:CHD Risk Coronary Heart Disease Risk Table                     Men   Women  1/2 Average Risk   3.4   3.3  Average Risk       5.0   4.4  2 X Average Risk   9.6   7.1  3 X Average Risk  23.4   11.0        Use the calculated Patient Ratio above and the CHD Risk Table to determine the patient's CHD Risk.        ATP III CLASSIFICATION (LDL):  <100     mg/dL   Optimal  720-947  mg/dL   Near or Above                    Optimal  130-159  mg/dL   Borderline  096-283  mg/dL   High  >662     mg/dL   Very High Performed at [facility], [address] [place], [place] [address]   Hemoglobin A1c     Status: Abnormal   Collection Time: 09/11/20  6:29 AM  Result Value Ref Range   Hgb A1c MFr Bld 6.2 (H) 4.8 - 5.6 %    Comment: (NOTE) Pre diabetes:          5.7%-6.4%  Diabetes:              >6.4%  Glycemic control for   <7.0% adults with diabetes    Mean Plasma Glucose 131.24 mg/dL    Comment: Performed at [facility], [address] [place], [place] [address]    Blood Alcohol level:  Lab Results  Component Value Date   [facility] <10 09/09/2020   ETH <10 11/30/2019    Metabolic Disorder Labs:  Lab Results  Component Value Date   HGBA1C 6.2 (H) 09/11/2020   MPG 131.24 09/11/2020   No results found for: PROLACTIN Lab Results  Component Value Date   CHOL 105 09/11/2020   TRIG 80 09/11/2020   HDL 29 (L) 09/11/2020   CHOLHDL 3.6 09/11/2020   VLDL 16 09/11/2020   LDLCALC 60 09/11/2020   LDLCALC 74 07/11/2014    Current Medications: Current Facility-Administered Medications  Medication Dose Route Frequency Provider Last Rate Last Admin  . acetaminophen (TYLENOL) tablet 650 mg  650 mg Oral  Q6H PRN Ardis Hughsoleman, Carolyn H, NP      . albuterol (VENTOLIN HFA) 108 (90 Base) MCG/ACT inhaler 2 puff  2 puff Inhalation Q6H PRN Antonieta Pertlary, Greg Lawson, MD      . alum & mag hydroxide-simeth (MAALOX/MYLANTA) 200-200-20 MG/5ML  suspension 30 mL  30 mL Oral Q4H PRN Ardis Hughsoleman, Carolyn H, NP      . gabapentin (NEURONTIN) capsule 200 mg  200 mg Oral TID Laveda AbbeParks, Noelani Harbach Britton, NP      . hydrOXYzine (ATARAX/VISTARIL) tablet 25 mg  25 mg Oral TID PRN Ardis Hughsoleman, Carolyn H, NP      . magnesium hydroxide (MILK OF MAGNESIA) suspension 30 mL  30 mL Oral Daily PRN Ardis Hughsoleman, Carolyn H, NP       PTA Medications: Medications Prior to Admission  Medication Sig Dispense Refill Last Dose  . acetaminophen (TYLENOL) 500 MG tablet Take 500 mg by mouth every 6 (six) hours as needed for headache or moderate pain (back pain, headache).     Marland Kitchen. albuterol (VENTOLIN HFA) 108 (90 Base) MCG/ACT inhaler Inhale 2 puffs into the lungs every 6 (six) hours as needed for wheezing or shortness of breath. (Patient not taking: Reported on 09/09/2020) 3 each 1   . amoxicillin (AMOXIL) 500 MG capsule Take 1 capsule (500 mg total) by mouth 3 (three) times daily. (Patient not taking: No sig reported) 30 capsule 0   . QUEtiapine (SEROQUEL) 100 MG tablet Take 1 tablet (100 mg total) by mouth at bedtime. 30 tablet 0     Musculoskeletal: Strength & Muscle Tone: within normal limits Gait & Station: normal Patient leans: N/A Psychiatric Specialty Exam:  Presentation  General Appearance: Disheveled  Eye Contact:Good  Speech:Clear and Coherent; Normal Rate  Speech Volume:Normal  Handedness:Right  Mood and Affect  Mood:Depressed; Anxious; Hopeless  Affect:Congruent; Depressed  Thought Process  Thought Processes:Coherent  Duration of Psychotic Symptoms: No data recorded Past Diagnosis of Schizophrenia or Psychoactive disorder: No  Descriptions of Associations:Intact  Orientation:Full (Time, Place and Person)  Thought Content:Logical  Hallucinations:Hallucinations: None  Ideas of Reference:None  Suicidal Thoughts:Suicidal Thoughts: Yes, Active SI Active Intent and/or Plan: With Intent; With Plan; With Means to Carry Out  Homicidal  Thoughts:Homicidal Thoughts: No   Sensorium  Memory:Immediate Good; Recent Good; Remote Good  Judgment:Poor  Insight:Poor  Executive Functions  Concentration:Good  Attention Span:Good  Recall:Good  Fund of Knowledge:Good  Language:Good   Psychomotor Activity  Psychomotor Activity:Psychomotor Activity: Normal  Assets  Assets:Communication Skills; Desire for Improvement; Physical Health; Social Support; Resilience  Sleep  Sleep:Sleep: Fair Number of Hours of Sleep: 5  Physical Exam: Physical Exam Vitals and nursing note reviewed.  Constitutional:      Appearance: Normal appearance.  HENT:     Head: Normocephalic.  Pulmonary:     Effort: Pulmonary effort is normal.  Musculoskeletal:        General: Normal range of motion.     Cervical back: Normal range of motion.  Neurological:     Mental Status: He is alert.  Psychiatric:        Attention and Perception: Attention normal. He does not perceive auditory or visual hallucinations.        Mood and Affect: Mood is anxious and depressed.        Speech: Speech normal.        Behavior: Behavior normal. Behavior is cooperative.        Thought Content: Thought content is not paranoid or delusional. Thought content includes suicidal ideation. Thought content  does not include homicidal ideation. Thought content includes suicidal plan. Thought content does not include homicidal plan.        Cognition and Memory: Cognition normal.     Comments: Suicidal ideation with plan to walk into traffic    Review of Systems  Constitutional: Negative for fever.  HENT: Negative for congestion and sore throat.   Respiratory: Negative for cough and shortness of breath.   Cardiovascular: Negative for chest pain.  Gastrointestinal: Negative.   Genitourinary: Negative.   Musculoskeletal: Negative.   Neurological: Negative.    Height 5\' 8"  (1.727 m), weight 103.9 kg. Body mass index is 34.82 kg/m.  Treatment Plan Summary: 1. Admit  to [address] hall for crisis management and stabilization, estimated length of stay 3-5 days.    2. Medication management to reduce current symptoms to base line and improve the patient's overall level of functioning: See MAR, Md's SRA & treatment plan.   Observation Level/Precautions:  15 minute checks  Laboratory:  Labs reviewed: CBC with diff - WBC 12.0, Platelets 418. CMP - Glucose 115, Creatinine 1.26, AST 76, Total protein 9.5. Lipid panel HDL 29. TSH 8.743. A1c 6.2, Acetaminophen <10, Salicylaye <7.0. UDS positive amphetamines and THC. ETOH <10. EKG Qtc 391, BPM 56.  Labs ordered: T3, T4, CMP  Psychotherapy:  Group therapy  Medications:  See MAR  Consultations:  TBD  Discharge Concerns:  Safety, substance abuse, homelessness  Estimated LOS: 3-5 days  Other:     Physician Treatment Plan for Primary Diagnosis: <principal problem not specified> Long Term Goal(s): Improvement in symptoms so as ready for discharge  Short Term Goals: Ability to identify changes in lifestyle to reduce recurrence of condition will improve, Ability to disclose and discuss suicidal ideas, Ability to identify and develop effective coping behaviors will improve and Ability to identify triggers associated with substance abuse/mental health issues will improve  Physician Treatment Plan for Secondary Diagnosis: Active Problems:   MDD (major depressive disorder), recurrent episode, severe ([name])  Long Term Goal(s): Improvement in symptoms so as ready for discharge  Short Term Goals: Ability to identify changes in lifestyle to reduce recurrence of condition will improve, Ability to verbalize feelings will improve, Ability to disclose and discuss suicidal ideas, Ability to identify and develop effective coping behaviors will improve and Ability to identify triggers associated with substance abuse/mental health issues will improve  I certify that inpatient services furnished can reasonably be expected to improve the patient's  condition.    , NP 5/18/202211:18 AM

## 2020-09-11 NOTE — BHH Suicide Risk Assessment (Signed)
[facility] Admission Suicide Risk Assessment   Nursing information obtained from:  Patient Demographic factors:  Male,Low socioeconomic status Current Mental Status:  NA Loss Factors:  Legal issues Historical Factors:  Impulsivity Risk Reduction Factors:  Responsible for children under 29 years of age,Employed,Living with another person, especially a relative  Total Time spent with patient: 30 minutes Principal Problem: MDD (major depressive disorder), recurrent episode, severe ([place]) Diagnosis:  Principal Problem:   MDD (major depressive disorder), recurrent episode, severe ([place]) Active Problems:   Methamphetamine use disorder, moderate ([place])  Subjective Data: Medical record reviewed.  Patient's case discussed in detail with members of the treatment team and with the APP.  I met with and evaluated the patient this afternoon on the unit.  Nathan Koch is a 30 year old male with a history of depression, anxiety, PTSD and methamphetamine use disorder who presented to [facility] ED requesting detox from methamphetamine and reporting suicidal ideation without plan.  Drug screen in the ED was positive for amphetamines and THC.  On interview with me today, the patient appears tired, reports depressed mood, displays constricted affect but is logical, coherent, cooperative and actively participates in conversation with me. The patient reportedly had been sober for 8 months from amphetamine use and then had a 3-day relapse beginning 09/06/2020 just prior to admission.  He reports symptoms of depressed mood, guilt, sleep disturbance, poor appetite, hopelessness, anxiety and suicidal ideation.  The patient states that he would like help getting back on his medications.  He reports passive wishes not to be alive but denies any current active intent or plan to try to harm himself on the unit.  The patient states that he feels guilty about having thrown away his sobriety.  He reports that since he has been in  the hospital he is eating and sleeping okay.  He denies AI, HI, AH, VH or PI.  The patient reports that he used marijuana prior to admission in addition to methamphetamine but denies use of other drugs and denies use of alcohol.  He would like to return to some form of residential substance abuse treatment program and in particular would like to return to [facility] in [place] where he has been in the past.  If he is unable to return there, the patient would like to get information about [facility] houses in [place] area.    The patient gives a history of prior treatment for anxiety, depression and PTSD.  He reports 2 prior inpatient psychiatric admissions both at [facility].  The first admission occurred from 09/14/2019 until 09/22/2019 and the second admission occurred from 10/10/2019 until 10/17/2019.  Patient was treated for depression and suicidal ideation in the context of methamphetamine use during both of these admissions.  He was started on citalopram during the May 2021 admission and then was treated with a combination of citalopram 20 mg daily, Depakote 500 mg every 12 hours and quetiapine 100 mg nightly in the June 2021 admission.  Patient states that he has not taken psychiatric medications for approximately the last 8 months.  The patient reports a history of prior suicide attempts by jumping in front of a car, by cutting and by trying to overdose on Benadryl and alcohol.  He reports his most recent attempt was 8 months ago.  He gives a history of nonsuicidal self-injurious behavior by cutting and scratching himself to change the way he feels.  He began cutting himself at age 25 and estimates he does so approximately once  per year.    The patient states that his mother has some sort of mental health issues and experiences mood swings.  He has a brother who either committed suicide or took an accidental overdose.  He reports that his mother has issues with alcohol and  substances.   Continued Clinical Symptoms:  Alcohol Use Disorder Identification Test Final Score (AUDIT): 15 The "Alcohol Use Disorders Identification Test", Guidelines for Use in Primary Care, Second Edition.  World Pharmacologist [facility]). Score between 0-7:  no or low risk or alcohol related problems. Score between 8-15:  moderate risk of alcohol related problems. Score between 16-19:  high risk of alcohol related problems. Score 20 or above:  warrants further diagnostic evaluation for alcohol dependence and treatment.   CLINICAL FACTORS:   Severe Anxiety and/or Agitation Depression:   Anhedonia Impulsivity Insomnia Alcohol/Substance Abuse/Dependencies More than one psychiatric diagnosis Previous Psychiatric Diagnoses and Treatments   Musculoskeletal: Strength & Muscle Tone: within normal limits Gait & Station: normal Patient leans: N/A  Psychiatric Specialty Exam:  Presentation  General Appearance: Casual; Other (comment) (Unkempt)  Eye Contact:Good  Speech:Clear and Coherent; Normal Rate  Speech Volume:Normal  Handedness:Right   Mood and Affect  Mood:Depressed; Anxious  Affect:Constricted; Depressed   Thought Process  Thought Processes:Coherent; Goal Directed  Descriptions of Associations:Intact  Orientation:Full (Time, Place and Person)  Thought Content:Logical  History of Schizophrenia/Schizoaffective disorder:No  Duration of Psychotic Symptoms:No data recorded Hallucinations:Hallucinations: None  Ideas of Reference:None  Suicidal Thoughts:Suicidal Thoughts: Yes, Passive SI Active Intent and/or Plan: With Intent; With Plan; With Means to Carry Out  Homicidal Thoughts:Homicidal Thoughts: No   Sensorium  Memory:Immediate Good; Recent Good; Remote Good  Judgment:Impaired  Insight:Shallow   Executive Functions  Concentration:Good  Attention Span:Good  [employer] of Knowledge:Good  Language:Good   Psychomotor Activity   Psychomotor Activity:Psychomotor Activity: Normal   Assets  Assets:Communication Skills; Desire for Improvement; Resilience; Social Support   Sleep  Sleep:Sleep: Fair Number of Hours of Sleep: 5.75    Physical Exam: Physical Exam Vitals and nursing note reviewed.  HENT:     Head: Normocephalic and atraumatic.  Pulmonary:     Effort: Pulmonary effort is normal.  Neurological:     General: No focal deficit present.     Mental Status: He is oriented to person, place, and time.    Review of Systems  Constitutional: Negative for diaphoresis and fever.  HENT: Negative for sore throat.   Respiratory: Negative for cough and shortness of breath.   Cardiovascular: Negative for chest pain and palpitations.  Gastrointestinal: Positive for abdominal pain and diarrhea. Negative for constipation, nausea and vomiting.  Musculoskeletal: Negative.   Neurological: Negative for dizziness, tremors and seizures.  Psychiatric/Behavioral: Positive for depression, substance abuse and suicidal ideas. Negative for hallucinations. The patient is nervous/anxious.    Height 5' 8"  (1.727 m), weight 103.9 kg. Body mass index is 34.82 kg/m.   COGNITIVE FEATURES THAT CONTRIBUTE TO RISK:  None    SUICIDE RISK:   Moderate:  Frequent suicidal ideation with limited intensity, and duration, some specificity in terms of plans, no associated intent, good self-control, limited dysphoria/symptomatology, some risk factors present, and identifiable protective factors, including available and accessible social support.  PLAN OF CARE: The patient has been admitted to the 300 inpatient unit.  We will continue on every 15-minute observation status.  Encouraged participation in group therapy and therapeutic milieu.  Available lab results reviewed.  CMP revealed CO2 of 21, glucose of 115, creatinine of  1.26, BUN of 19, AST 76, ALT of 39, total protein of 9.5 and total bili of 2.1 and was otherwise WNL.  CBC revealed WBC  of 12.0, platelets of 418, neutrophil count of 8.4, monocyte count of 1.1 and was otherwise WNL.  Lipid panel revealed HDL of 29 and was otherwise WNL.  Hemoglobin A1c was 6.2.  TSH was elevated at 8.743.  Free T4 and T3 have been ordered but not yet performed.  A repeat CMP has also been ordered for the a.m. draw.  COVID test was negative.  Urine drug screen was positive for amphetamines and THC.  Patient has been started on Zoloft 50 mg daily for depression and anxiety and quetiapine 100 mg at bedtime for adjunctive treatment of depression and for sleep.  He has also been started on gabapentin 200 mg 3 times daily.  We have restarted his outpatient albuterol inhaler every 6 hours as needed for shortness of breath.  See MAR for additional details.  The patient is requesting referral to residential substance abuse treatment program after discharge in the [place] area if possible.  He will need assistance with a list of [facility] houses if residential substance abuse treatment program cannot be obtained.  Estimated length of stay 5 days.  I certify that inpatient services furnished can reasonably be expected to improve the patient's condition.   Arthor Captain, MD 09/11/2020, 4:13 PM

## 2020-09-11 NOTE — BHH Group Notes (Signed)
Type of Therapy and Topic:  Group Therapy:  Setting Goals   Participation Level:  Active   Description of Group: In this process group, patients discussed using strengths to work toward goals and address challenges.  Patients identified two positive things about themselves and one goal they were working on.  Patients were given the opportunity to share openly and support each other's plan for self-empowerment.  The group discussed the value of gratitude and were encouraged to have a daily reflection of positive characteristics or circumstances.  Patients were encouraged to identify a plan to utilize their strengths to work on current challenges and goals.   Therapeutic Goals 1. Patient will verbalize personal strengths/positive qualities and relate how these can assist with achieving desired personal goals 2. Patients will verbalize affirmation of peers plans for personal change and goal setting 3. Patients will explore the value of gratitude and positive focus as related to successful achievement of goals 4. Patients will verbalize a plan for regular reinforcement of personal positive qualities and circumstances.   Summary of Patient Progress: Patient identified the definition of goals. Patients was given the opportunity to share openly and support other group members' plan for self-empowerment. Patient verbalized personal strength and how they relate to achieving the desired goal. Patient was able to identify positive goals to work towards when she returns home.  

## 2020-09-11 NOTE — Progress Notes (Signed)
Nathan Koch is a 30 y.o. male Voluntary admitted for detox, pt also reported suicide ideation without a plan. Pt stated he has been clean for 8 months from using meth, but relapsed after the wife suggested that they try it one more time, pt has a history of self by cutting, last incident was one year ago. Pt denies SI/HI at this time and contracted for safety. Pt has been cooperative with admission process, alert and oriented x 4. Consents signed, skin/belongings search completed and pt oriented to unit. Pt stable at this time. Pt given the opportunity to express concerns and ask questions. Pt given toiletries. Will continue to monitor.

## 2020-09-11 NOTE — Progress Notes (Signed)
  D: Patient denies SI/HI/AVH. Patient rated anxiety 6/10 and depression 7/10. Patient denied pain and stated that sleep was "pretty good." Pt. Out in open areas and attended group. A:  Patient took scheduled medicine.  Support and encouragement provided Routine safety checks conducted every 15 minutes. Patient  Informed to notify staff with any concerns.   R:  Safety maintained.

## 2020-09-11 NOTE — Tx Team (Signed)
Interdisciplinary Treatment and Diagnostic Plan Update  09/11/2020 Time of Session: 9:00am  Nathan Koch MRN: [id number]  Principal Diagnosis: <principal problem not specified>  Secondary Diagnoses: Active Problems:   MDD (major depressive disorder), recurrent episode, severe ([name])   Current Medications:  Current Facility-Administered Medications  Medication Dose Route Frequency Provider Last Rate Last Admin  . acetaminophen (TYLENOL) tablet 650 mg  650 mg Oral Q6H PRN Revonda Humphrey, NP      . albuterol (VENTOLIN HFA) 108 (90 Base) MCG/ACT inhaler 2 puff  2 puff Inhalation Q6H PRN Sharma Covert, MD      . alum & mag hydroxide-simeth (MAALOX/MYLANTA) 200-200-20 MG/5ML suspension 30 mL  30 mL Oral Q4H PRN Revonda Humphrey, NP      . hydrOXYzine (ATARAX/VISTARIL) tablet 25 mg  25 mg Oral TID PRN Revonda Humphrey, NP      . magnesium hydroxide (MILK OF MAGNESIA) suspension 30 mL  30 mL Oral Daily PRN Revonda Humphrey, NP       PTA Medications: Medications Prior to Admission  Medication Sig Dispense Refill Last Dose  . acetaminophen (TYLENOL) 500 MG tablet Take 500 mg by mouth every 6 (six) hours as needed for headache or moderate pain (back pain, headache).     Marland Kitchen albuterol (VENTOLIN HFA) 108 (90 Base) MCG/ACT inhaler Inhale 2 puffs into the lungs every 6 (six) hours as needed for wheezing or shortness of breath. (Patient not taking: Reported on 09/09/2020) 3 each 1   . amoxicillin (AMOXIL) 500 MG capsule Take 1 capsule (500 mg total) by mouth 3 (three) times daily. (Patient not taking: No sig reported) 30 capsule 0   . QUEtiapine (SEROQUEL) 100 MG tablet Take 1 tablet (100 mg total) by mouth at bedtime. 30 tablet 0     Patient Stressors: Legal issue Marital or family conflict Substance abuse  Patient Strengths: Capable of independent living Supportive family/friends Work skills  Treatment Modalities: Medication Management, Group therapy, Case management,  1 to 1  session with clinician, Psychoeducation, Recreational therapy.   Physician Treatment Plan for Primary Diagnosis: <principal problem not specified> Long Term Goal(s):     Short Term Goals:    Medication Management: Evaluate patient's response, side effects, and tolerance of medication regimen.  Therapeutic Interventions: 1 to 1 sessions, Unit Group sessions and Medication administration.  Evaluation of Outcomes: Not Met  Physician Treatment Plan for Secondary Diagnosis: Active Problems:   MDD (major depressive disorder), recurrent episode, severe ([place])  Long Term Goal(s):     Short Term Goals:       Medication Management: Evaluate patient's response, side effects, and tolerance of medication regimen.  Therapeutic Interventions: 1 to 1 sessions, Unit Group sessions and Medication administration.  Evaluation of Outcomes: Not Met   RN Treatment Plan for Primary Diagnosis: <principal problem not specified> Long Term Goal(s): Knowledge of disease and therapeutic regimen to maintain health will improve  Short Term Goals: Ability to remain free from injury will improve, Ability to participate in decision making will improve, Ability to verbalize feelings will improve, Ability to disclose and discuss suicidal ideas and Ability to identify and develop effective coping behaviors will improve  Medication Management: RN will administer medications as ordered by provider, will assess and evaluate patient's response and provide education to patient for prescribed medication. RN will report any adverse and/or side effects to prescribing provider.  Therapeutic Interventions: 1 on 1 counseling sessions, Psychoeducation, Medication administration, Evaluate responses to treatment, Monitor vital signs and  CBGs as ordered, Perform/monitor CIWA, COWS, AIMS and Fall Risk screenings as ordered, Perform wound care treatments as ordered.  Evaluation of Outcomes: Not Met   LCSW Treatment Plan for Primary  Diagnosis: <principal problem not specified> Long Term Goal(s): Safe transition to appropriate next level of care at discharge, Engage patient in therapeutic group addressing interpersonal concerns.  Short Term Goals: Engage patient in aftercare planning with referrals and resources, Increase social support, Increase emotional regulation, Facilitate acceptance of mental health diagnosis and concerns, Facilitate patient progression through stages of change regarding substance use diagnoses and concerns, Identify triggers associated with mental health/substance abuse issues and Increase skills for wellness and recovery  Therapeutic Interventions: Assess for all discharge needs, 1 to 1 time with Social worker, Explore available resources and support systems, Assess for adequacy in community support network, Educate family and significant other(s) on suicide prevention, Complete Psychosocial Assessment, Interpersonal group therapy.  Evaluation of Outcomes: Not Met   Progress in Treatment: Attending groups: Yes. Participating in groups: Yes. Taking medication as prescribed: Yes. Toleration medication: Yes. Family/Significant other contact made: Yes, individual(s) contacted:  Fiance  Patient understands diagnosis: Yes. Discussing patient identified problems/goals with staff: Yes. Medical problems stabilized or resolved: Yes. Denies suicidal/homicidal ideation: Yes. Issues/concerns per patient self-inventory: No.   New problem(s) identified: No, Describe:  None   New Short Term/Long Term Goal(s): medication stabilization, elimination of SI thoughts, development of comprehensive mental wellness plan.   Patient Goals: "To improve my recovery and the relationships with my family"  Discharge Plan or Barriers: Patient recently admitted. CSW will continue to follow and assess for appropriate referrals and possible discharge planning.   Reason for Continuation of Hospitalization:  Depression Medication stabilization Suicidal ideation Withdrawal symptoms  Estimated Length of Stay: 3 to 5 days   Attendees: Patient: Nathan Koch  09/11/2020   Physician: Myles Lipps, MD 09/11/2020   Nursing:  09/11/2020   RN Care Manager: 09/11/2020   Social Worker: Verdis Frederickson, [place]  09/11/2020   Recreational Therapist:  09/11/2020   Other:  09/11/2020   Other:  09/11/2020   Other: 09/11/2020     Scribe for Treatment Team: Darleen Crocker, [place] 09/11/2020 11:03 AM

## 2020-09-11 NOTE — Progress Notes (Signed)
Recreation Therapy Notes  Date: 5.18.22 Time: 0950 Location: 500 Hall Dayroom  Group Topic: Stress Management  Goal Area(s) Addresses:  Patient will identify positive stress management techniques. Patient will identify benefits of using stress management post d/c.  Behavioral Response:  Attentive  Intervention: Stress Management  Activity: Meditation.  LRT played a meditation that focused on taking on the characteristics of a mountain.  The meditation focused on being unmovable in the face of changes/obstacles that occur in life.     Education:  Stress Management, Discharge Planning.   Education Outcome: Acknowledges Education  Clinical Observations/Feedback: Pt attended and participated in group.    Caroll Rancher, LRT/CTRS       Lillia Abed, Brendi Mccarroll A 09/11/2020 10:45 AM

## 2020-09-11 NOTE — Progress Notes (Signed)
BHH Group Notes:  (Nursing/MHT/Case Management/Adjunct)  Date:  09/11/2020  Time:  2015  Type of Therapy:  wrap up group  Participation Level:  Active  Participation Quality:  Appropriate, Attentive, Sharing and Supportive  Affect:  Appropriate  Cognitive:  Alert  Insight:  Improving  Engagement in Group:  Engaged  Modes of Intervention:  Clarification, Education and Support  Summary of Progress/Problems: Positive thinking and positive change were discussed.   Marcille Buffy 09/11/2020, 10:38 PM

## 2020-09-12 LAB — COMPREHENSIVE METABOLIC PANEL
ALT: 27 U/L (ref 0–44)
AST: 29 U/L (ref 15–41)
Albumin: 4 g/dL (ref 3.5–5.0)
Alkaline Phosphatase: 60 U/L (ref 38–126)
Anion gap: 8 (ref 5–15)
BUN: 14 mg/dL (ref 6–20)
CO2: 27 mmol/L (ref 22–32)
Calcium: 9.2 mg/dL (ref 8.9–10.3)
Chloride: 103 mmol/L (ref 98–111)
Creatinine, Ser: 0.82 mg/dL (ref 0.61–1.24)
GFR, Estimated: >60 mL/min (ref >60)
Glucose, Bld: 107 mg/dL — ABNORMAL HIGH (ref 70–99)
Potassium: 4.1 mmol/L (ref 3.5–5.1)
Sodium: 138 mmol/L (ref 135–145)
Total Bilirubin: 0.5 mg/dL (ref 0.3–1.2)
Total Protein: 7.1 g/dL (ref 6.5–8.1)

## 2020-09-12 LAB — T4, FREE: Free T4: 0.81 ng/dL (ref 0.61–1.12)

## 2020-09-12 NOTE — BHH Counselor (Signed)
CSW spoke with Nathan Koch   At [facility] in [place] who states that the patient spent 8 months at the facility and decided to leave to be with his family.  Mrs. Nathan Koch states that she contacted the patient and asked him to return but he refused.  She states that he also quit his employment and was not completely cooperative with their rules and process at [facility]. Mrs. Nathan Koch states that the patient cannot return to [facility] at this time and states that she believes the patient needs a more supportive placement that can work with him on his recovery.  CSW will inform the patient of this information and discuss other options.

## 2020-09-12 NOTE — BHH Counselor (Signed)
CSW contacted [facility] in  [phone] to see if the patient could return to the facility.  CSW was informed that the patient left the facility less than a week ago and during his time at the facility was placed on discharge warning 4 times.  CSW was told that it is most likely that the director will not let the patient return.  CSW is awaiting a call from the director stating if the patient is allowed to return or not.
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
## 2020-09-12 NOTE — Progress Notes (Signed)
[facility] MD Progress Note  09/12/2020 1:41 PM Nathan Koch  MRN:  [id number]   Reason for admission:  Nathan Koch is a 30 year old male with a history of depression, anxiety, PTSD and methamphetamine use disorder who presented to [facility] ED requesting detox from methamphetamine and reporting suicidal ideation without plan.  Drug screen in the ED was positive for amphetamines and THC.  Objective: Medical record reviewed.  Patient's case discussed in detail with members of the treatment team.  I met with and evaluated the patient today on the unit.  Patient reports that his mood remains low today.  He feels tired this morning and review of MAR indicates that he received PRN trazodone and PRN's Terrel last night at the same time as he took his standing dose quetiapine 100 mg QHS and standing dose Zoloft.  He reports he continues to have passive wishes not to be alive but denies active suicidal ideation intent or plan.  He feels ongoing guilt regarding his recent relapse into substance use.  Appetite is okay.  He denies AI, HI, PI, AH or VH.  He denies any medication side effects other than fatigue.  He denies any physical problems.  The patient appears fatigued during the interview and yawns throughout although he is cooperative and appropriately engaged in our conversation.  Affect is constricted and mood is depressed.  We discussed the plan to discontinue his PRN will to avoid the patient receiving excessive nighttime medications.  Advised patient that if he is feeling anxious he can request that the nursing staff call me or the on-call provider tonight to request a one-time dose.  Vital signs this morning include BP of 107/70, pulse of 72, O2 sat of 100% and temperature of 97.7.  Labs from this morning include CMP with glucose of 107 and otherwise WNL.  Free T4 was WNL at 0.81.  Taking standing dose medications as prescribed.  He did receive PRN Vistaril and PRN trazodone last night offered with  his standing dose bedtime medications.  The patient has been attending groups and participation has been appropriate.  He has been interacting well with peers and staff.  Principal Problem: MDD (major depressive disorder), recurrent episode, severe ([place]) Diagnosis: Principal Problem:   MDD (major depressive disorder), recurrent episode, severe ([name]) Active Problems:   Methamphetamine use disorder, moderate ([place])  Total Time spent with patient: 20 minutes  Past Psychiatric History: The patient gives a history of prior treatment for anxiety, depression and PTSD.  He reports 2 prior inpatient psychiatric admissions both at [facility].  The first admission occurred from 09/14/2019 until 09/22/2019 and the second admission occurred from 10/10/2019 until 10/17/2019.  Patient was treated for depression and suicidal ideation in the context of methamphetamine use during both of these admissions.  He was started on citalopram during the May 2021 admission and then was treated with a combination of citalopram 20 mg daily, Depakote 500 mg every 12 hours and quetiapine 100 mg nightly in the June 2021 admission.  Patient states that he has not taken psychiatric medications for approximately the last 8 months.  The patient reports a history of prior suicide attempts by jumping in front of a car, by cutting and by trying to overdose on Benadryl and alcohol.  He reports his most recent attempt was 8 months ago.  He gives a history of nonsuicidal self-injurious behavior by cutting and scratching himself to change the way he feels.  He began cutting himself at  age 89 and estimates he does so approximately once per year.    Past Medical History:  Past Medical History:  Diagnosis Date  . Anxiety   . Anxiety disorder   . Asthma   . Bipolar 1 disorder ([place])   . Depression   . Methamphetamine abuse ([place])   . PTSD (post-traumatic stress disorder)     Past Surgical History:  Procedure Laterality Date   . CLOSED REDUCTION MANDIBLE N/A 07/02/2018   Procedure: CLOSED REDUCTION MANDIBULAR;  Surgeon: Margaretha Sheffield, MD;  Location: [facility] ORS;  Service: ENT;  Laterality: N/A;   Family History:  Family History  Problem Relation Age of Onset  . Asthma Mother   . Cancer Mother        breast cancer  . Ulcers Mother   . Cancer Father        esophogeal cancer  . Hypertension Father   . Asthma Brother    Family Psychiatric  History: The patient states that his mother has some sort of mental health issues and experiences mood swings.  He has a brother who either committed suicide or took an accidental overdose.  He reports that his mother has issues with alcohol and substances.  Social History:  Social History   Substance and Sexual Activity  Alcohol Use Not Currently  . Alcohol/week: 7.0 standard drinks  . Types: 7 Standard drinks or equivalent per week   Comment: previously heavy drinker 2016. most recent 4 beer/ day drinker and none since 01-11-2020     Social History   Substance and Sexual Activity  Drug Use Yes  . Types: Methamphetamines   Comment: last used 09/07/20    Social History   Socioeconomic History  . Marital status: Single    Spouse name: Not on file  . Number of children: Not on file  . Years of education: Not on file  . Highest education level: Not on file  Occupational History  . Not on file  Tobacco Use  . Smoking status: Current Every Day Smoker    Packs/day: 0.50    Years: 8.00    Pack years: 4.00    Types: Cigarettes  . Smokeless tobacco: Never Used  Vaping Use  . Vaping Use: Every day  Substance and Sexual Activity  . Alcohol use: Not Currently    Alcohol/week: 7.0 standard drinks    Types: 7 Standard drinks or equivalent per week    Comment: previously heavy drinker 2016. most recent 4 beer/ day drinker and none since 01-11-2020  . Drug use: Yes    Types: Methamphetamines    Comment: last used 09/07/20  . Sexual activity: Not on file  Other Topics  Concern  . Not on file  Social History Narrative  . Not on file   Social Determinants of Health   Financial Resource Strain: Not on file  Food Insecurity: Not on file  Transportation Needs: Not on file  Physical Activity: Not on file  Stress: Not on file  Social Connections: Not on file   Additional Social History:    Pain Medications: See MAR Prescriptions: See MAR Over the Counter: See MAR History of alcohol / drug use?: Yes Negative Consequences of Use: Personal relationships,Legal Withdrawal Symptoms: Agitation,Irritability                    Sleep: Good  Appetite:  Fair  Current Medications: Current Facility-Administered Medications  Medication Dose Route Frequency Provider Last Rate Last Admin  . acetaminophen (TYLENOL) tablet  650 mg  650 mg Oral Q6H PRN Revonda Humphrey, NP      . albuterol (VENTOLIN HFA) 108 (90 Base) MCG/ACT inhaler 2 puff  2 puff Inhalation Q6H PRN Sharma Covert, MD   2 puff at 09/11/20 2227  . alum & mag hydroxide-simeth (MAALOX/MYLANTA) 200-200-20 MG/5ML suspension 30 mL  30 mL Oral Q4H PRN Revonda Humphrey, NP      . gabapentin (NEURONTIN) capsule 200 mg  200 mg Oral TID Ethelene Hal, NP   200 mg at 09/12/20 0854  . magnesium hydroxide (MILK OF MAGNESIA) suspension 30 mL  30 mL Oral Daily PRN Revonda Humphrey, NP      . QUEtiapine (SEROQUEL) tablet 100 mg  100 mg Oral QHS Ethelene Hal, NP   100 mg at 09/11/20 2113  . sertraline (ZOLOFT) tablet 50 mg  50 mg Oral QHS Ethelene Hal, NP   50 mg at 09/11/20 2113    Lab Results:  Results for orders placed or performed during the hospital encounter of 09/10/20 (from the past 48 hour(s))  TSH     Status: Abnormal   Collection Time: 09/11/20  6:29 AM  Result Value Ref Range   TSH 8.743 (H) 0.350 - 4.500 uIU/mL    Comment: Performed by a 3rd Generation assay with a functional sensitivity of <=0.01 uIU/mL. Performed at [facility], [place] [address] [place], [place] [address]   Lipid panel     Status: Abnormal   Collection Time: 09/11/20  6:29 AM  Result Value Ref Range   Cholesterol 105 0 - 200 mg/dL   Triglycerides 80 <150 mg/dL   HDL 29 (L) >40 mg/dL   Total CHOL/HDL Ratio 3.6 RATIO   VLDL 16 0 - 40 mg/dL   LDL Cholesterol 60 0 - 99 mg/dL    Comment:        Total Cholesterol/HDL:CHD Risk Coronary Heart Disease Risk Table                     Men   Women  1/2 Average Risk   3.4   3.3  Average Risk       5.0   4.4  2 X Average Risk   9.6   7.1  3 X Average Risk  23.4   11.0        Use the calculated Patient Ratio above and the CHD Risk Table to determine the patient's CHD Risk.        ATP III CLASSIFICATION (LDL):  <100     mg/dL   Optimal  100-129  mg/dL   Near or Above                    Optimal  130-159  mg/dL   Borderline  160-189  mg/dL   High  >190     mg/dL   Very High Performed at [place] [address], [place], [place] [address]   Hemoglobin A1c     Status: Abnormal   Collection Time: 09/11/20  6:29 AM  Result Value Ref Range   Hgb A1c MFr Bld 6.2 (H) 4.8 - 5.6 %    Comment: (NOTE) Pre diabetes:          5.7%-6.4%  Diabetes:              >6.4%  Glycemic control for   <7.0% adults with diabetes    Mean Plasma Glucose 131.24  mg/dL    Comment: Performed at [facility], [place] [address] [place], [place] [address]  T4, free     Status: None   Collection Time: 09/12/20  6:42 AM  Result Value Ref Range   Free T4 0.81 0.61 - 1.12 ng/dL    Comment: (NOTE) Biotin ingestion may interfere with free T4 tests. If the results are inconsistent with the TSH level, previous test results, or the clinical presentation, then consider biotin interference. If needed, order repeat testing after stopping biotin. Performed at [facility], [place] [address], [place], [place] [address]   Comprehensive metabolic panel     Status: Abnormal   Collection Time: 09/12/20  6:42 AM   Result Value Ref Range   Sodium 138 135 - 145 mmol/L   Potassium 4.1 3.5 - 5.1 mmol/L   Chloride 103 98 - 111 mmol/L   CO2 27 22 - 32 mmol/L   Glucose, Bld 107 (H) 70 - 99 mg/dL    Comment: Glucose reference range applies only to samples taken after fasting for at least 8 hours.   BUN 14 6 - 20 mg/dL   Creatinine, Ser 0.82 0.61 - 1.24 mg/dL   Calcium 9.2 8.9 - 10.3 mg/dL   Total Protein 7.1 6.5 - 8.1 g/dL   Albumin 4.0 3.5 - 5.0 g/dL   AST 29 15 - 41 U/L   ALT 27 0 - 44 U/L   Alkaline Phosphatase 60 38 - 126 U/L   Total Bilirubin 0.5 0.3 - 1.2 mg/dL   GFR, Estimated >60 >60 mL/min    Comment: (NOTE) Calculated using the CKD-EPI Creatinine Equation (2021)    Anion gap 8 5 - 15    Comment: Performed at [facility] [place] [address], [place], [place] [address]    Blood Alcohol level:  Lab Results  Component Value Date   ETH <10 09/09/2020   ETH <10 91/47/8295    Metabolic Disorder Labs: Lab Results  Component Value Date   HGBA1C 6.2 (H) 09/11/2020   MPG 131.24 09/11/2020   No results found for: PROLACTIN Lab Results  Component Value Date   CHOL 105 09/11/2020   TRIG 80 09/11/2020   HDL 29 (L) 09/11/2020   CHOLHDL 3.6 09/11/2020   VLDL 16 09/11/2020   LDLCALC 60 09/11/2020   LDLCALC 74 07/11/2014    Physical Findings: AIMS: Facial and Oral Movements Muscles of Facial Expression: None, normal Lips and Perioral Area: None, normal Jaw: None, normal Tongue: None, normal,Extremity Movements Upper (arms, wrists, hands, fingers): None, normal Lower (legs, knees, ankles, toes): None, normal, Trunk Movements Neck, shoulders, hips: None, normal, Overall Severity Severity of abnormal movements (highest score from questions above): None, normal Incapacitation due to abnormal movements: None, normal Patient's awareness of abnormal movements (rate only patient's report): No Awareness, Dental Status Current problems with teeth and/or dentures?: No Does  patient usually wear dentures?: No  CIWA:  CIWA-Ar Total: 1 COWS:  COWS Total Score: 2  Musculoskeletal: Strength & Muscle Tone: within normal limits Gait & Station: normal Patient leans: N/A  Psychiatric Specialty Exam:  Presentation  General Appearance: Casual (Appears tired)  Eye Contact:Good  Speech:Clear and Coherent; Normal Rate  Speech Volume:Normal  Handedness:Right   Mood and Affect  Mood:Depressed  Affect:Constricted   Thought Process  Thought Processes:Coherent; Goal Directed; Linear  Descriptions of Associations:Intact  Orientation:Full (Time, Place and Person)  Thought Content:Logical; Rumination  History of Schizophrenia/Schizoaffective disorder:No  Duration of Psychotic Symptoms:No data  recorded Hallucinations:Hallucinations: None  Ideas of Reference:None  Suicidal Thoughts:Suicidal Thoughts: Yes, Passive SI Passive Intent and/or Plan: Without Intent; Without Plan  Homicidal Thoughts:Homicidal Thoughts: No   Sensorium  Memory:Immediate Good; Recent Good; Remote Good  Judgment:Fair  Insight:Fair   Executive Functions  Concentration:Good  Attention Span:Good  Recall:Good  Fund of Knowledge:Good  Language:Good   Psychomotor Activity  Psychomotor Activity:Psychomotor Activity: Normal   Assets  Assets:Communication Skills; Desire for Improvement; Resilience; Social Support   Sleep  Sleep:Sleep: Good Number of Hours of Sleep: 6.75    Physical Exam: Physical Exam Vitals and nursing note reviewed.  HENT:     Head: Normocephalic and atraumatic.  Pulmonary:     Effort: Pulmonary effort is normal.  Neurological:     General: No focal deficit present.     Mental Status: He is alert and oriented to person, place, and time.    Review of Systems  Constitutional: Positive for malaise/fatigue. Negative for chills, diaphoresis and fever.  Respiratory: Negative.   Cardiovascular: Negative.   Gastrointestinal: Negative.    Neurological: Negative.   Psychiatric/Behavioral: Positive for depression, substance abuse and suicidal ideas. Negative for hallucinations. The patient does not have insomnia.    Blood pressure 107/70, pulse 72, temperature 97.7 F (36.5 C), temperature source Oral, resp. rate 18, height 5' 8"  (1.727 m), weight 103.9 kg, SpO2 100 %. Body mass index is 34.82 kg/m.   Treatment Plan Summary: Daily contact with patient to assess and evaluate symptoms and progress in treatment and Medication management   Continue every 15-minute observation status  Encouraged participation in group therapy and therapeutic milieu.  Depression/anxiety -Continue Zoloft 50 mg daily for depression and anxiety -Continue quetiapine 100 mg QHS for adjunctive treatment of mood and sleep -Continue gabapentin 200 mg 3 times daily for adjunctive treatment of anxiety -Have discontinued hydroxyzine 25 mg 3 times daily PRN Zaidi to reduce likelihood of oversedation with at bedtime as needed medications  Insomnia -Have discontinued PRN trazodone due to patient fatigue this morning -We will continue Seroquel 100 mg QHS for adjunctive treatment of mood and sleep  Substance use disorder -Patient would like to attend residential substance abuse treatment program at [facility] in [place].  Social work is investigating whether patient can be accepted to this program. -Patient is also interested in going to [facility] in the [place] area if he cannot get into residential substance abuse treatment program.  Disposition planning is in progress  I certify that inpatient services furnished can reasonably be expected to improve the patient's condition.  Arthor Captain, MD 09/12/2020, 1:41 PM

## 2020-09-12 NOTE — Progress Notes (Signed)
Patient interacted well with [facility] and staff attending scheduled programming.  Patient c/o anxiety at HS prn Vistaril 25 mg PO given and albuterol 2 puff given at 2227 for wheezing reported effective by 2320. Patient denies SI/HI/A/VH at present support and encouragement provided as needed.

## 2020-09-12 NOTE — Progress Notes (Signed)
   09/12/20 0626  Vital Signs  Temp 97.7 F (36.5 C)  Temp Source Oral  Pulse Rate 69  Resp 18  BP (!) 96/55  BP Location Left Arm  BP Method Automatic  Patient Position (if appropriate) Standing  Oxygen Therapy  SpO2 100 %   D: Patient denies SI/HI/AVH. Patient rated both anxiety and depression 7/10. Patient out in open areas and was social with peers and staff.  A:  Patient took scheduled medicine.  Support and encouragement provided Routine safety checks conducted every 15 minutes. Patient  Informed to notify staff with any concerns.   R: Safety maintained.

## 2020-09-13 DIAGNOSIS — F332 Major depressive disorder, recurrent severe without psychotic features: Principal | ICD-10-CM

## 2020-09-13 DIAGNOSIS — F1520 Other stimulant dependence, uncomplicated: Secondary | ICD-10-CM

## 2020-09-13 LAB — T3: T3, Total: 106 ng/dL (ref 71–180)

## 2020-09-13 NOTE — Progress Notes (Signed)
D:  Patient's self inventory sheet, patient sleeps good, no sleep medication.  Good appetite, normal energy level, good concentration.  Rated depression and hopeless 4, anxiety 5.  Denied withdrawals.  Denied SI.  Denied physical problems.  Denied physical pain.  Goal is be with family.  Plans to discuss discharge.  No discharge plans. A:  Medications administered per MD orders.  Emotional support and encouragement given patient. R:  Denied SI and HI, contracts for safety.  Denied A/V hallucinations.  Safety maintained with 15 minute checks.

## 2020-09-13 NOTE — BHH Counselor (Signed)
CSW placed [facility]: [employer] in pt's chart per his request.   Fredirick Lathe, LCSWA Clinicial Social Worker [employer]

## 2020-09-13 NOTE — Progress Notes (Cosign Needed)
Adult Psychoeducational Group Note  Date:  09/13/2020 Time:  5:04 PM  Group Topic/Focus:  Building Self Esteem:   The Focus of this group is helping patients become aware of the effects of self-esteem on their lives, the things they and others do that enhance or undermine their self-esteem, seeing the relationship between their level of self-esteem and the choices they make and learning ways to enhance self-esteem.  Participation Level:  Active  Participation Quality:  Appropriate  Affect:  Appropriate  Cognitive:  Appropriate  Insight: Appropriate  Engagement in Group:  Engaged  Modes of Intervention:  Discussion  Additional Comments:  Pt attended group and participated in discussion.  Nathan Koch Nathan Koch 09/13/2020, 5:04 PM

## 2020-09-13 NOTE — Plan of Care (Signed)
Nurse discussed coping skills with patient.  

## 2020-09-13 NOTE — [facility] Suicide Risk Assessment (Signed)
[facility] INPATIENT:  Family/Significant Other Suicide Prevention Education  Suicide Prevention Education:  Contact Attempts: Cheri Guppy [phone] (Fiance), (name of family member/significant other) has been identified by the patient as the family member/significant other with whom the patient will be residing, and identified as the person(s) who will aid the patient in the event of a mental health crisis.  With written consent from the patient, two attempts were made to provide suicide prevention education, prior to and/or following the patient's discharge.  We were unsuccessful in providing suicide prevention education.  A suicide education pamphlet was given to the patient to share with family/significant other.  Date and time of first attempt: 09/13/20 11:14am   /  CSW left a HIPAA complaint message.  Date and time of second attempt: CSW will make another attempt at a later time.   Felizardo Hoffmann 09/13/2020, 11:14 AM

## 2020-09-13 NOTE — Progress Notes (Signed)
Recreation Therapy Notes  Date: 09/13/2020 Time: 900a  Topic: Stress Management   Goal Area(s) Addresses:  Patient will review and complete packet supporting identification of stressors and and techniques to combat compounding stress.   Intervention: Independent Workbook   Education: Stress, Time Management, Coping Skills, Lifestyle Changes, Discharge Planning   Comments: LRT provided pt a workbook reviewing stress management concepts and offering an opportunity to create a plan to address stressors. Pt given the option to complete the packet in the dayroom with RN/MHT staff and peers or at their own pace in their room.   Araya Roel, LRT/CTRS 

## 2020-09-13 NOTE — Progress Notes (Signed)
[facility] MD Progress Note  09/13/2020 12:24 PM Nathan Koch  MRN:  [id number]   Subjective: Nathan Koch reports, "I'm feeling great today. I have no withdrawal symptoms. I still feel a little depressed but not so bad. I have no anxiety symptoms. I slept well last night. I'm doing well on medications, no side effects. No withdrawal symptoms. My plan is to move into an [place] after discharge".  Reason for admission:  Nathan Koch is a 30 year old male with a history of depression, anxiety, PTSD and methamphetamine use disorder who presented to [facility] ED requesting detox from methamphetamine and reporting suicidal ideation without plan.  Drug screen in the ED was positive for amphetamines and THC.  Objective: Medical record reviewed.  Patient's case discussed in detail with members of the treatment team.  I met with and evaluated the patient today on the unit. Nathan Koch presents alert, oriented x 3 & aware of situation. He is visible on the unit, attending group sessions. He says he is doing great today. Reports some depressive symptoms, more or less, guilt about his recent relapse on Methamphetamine after 8 months sobriety. He blamed his relapse on his mother inlaw's funky attitude towards him. He says his mother-inlaw is making his life more stressful than it should be. Says she is very hard to deal with. He denies any symptoms of anxiety. Denies any side effects from his medications. Denies any SIHI, AVH, delusional thoughts or paranoia. He does not appear to be responding to any internal stimuli.  Vital signs this morning include BP of 92/60, pulse of 63, O2 sat of 100% and temperature of 98.  Labs from this yesterday morning include CMP with glucose of 107 and otherwise WNL. Free T4 was WNL at 0.81.  Taking standing dose medications as prescribed. The patient has been attending groups and participation has been appropriate.  He has been interacting well with peers and staff. Rated depression #2  today. Wants to know when he could be discharged. Plans on moving into an [place] after discharge.  Principal Problem: MDD (major depressive disorder), recurrent episode, severe ([place]) Diagnosis: Principal Problem:   MDD (major depressive disorder), recurrent episode, severe ([name]) Active Problems:   Methamphetamine use disorder, moderate ([place])  Total Time spent with patient: 15 minutes  Past Psychiatric History: The patient gives a history of prior treatment for anxiety, depression and PTSD.  He reports 2 prior inpatient psychiatric admissions both at [facility].  The first admission occurred from 09/14/2019 until 09/22/2019 and the second admission occurred from 10/10/2019 until 10/17/2019.  Patient was treated for depression and suicidal ideation in the context of methamphetamine use during both of these admissions.  He was started on citalopram during the May 2021 admission and then was treated with a combination of citalopram 20 mg daily, Depakote 500 mg every 12 hours and quetiapine 100 mg nightly in the June 2021 admission.  Patient states that he has not taken psychiatric medications for approximately the last 8 months.  The patient reports a history of prior suicide attempts by jumping in front of a car, by cutting and by trying to overdose on Benadryl and alcohol.  He reports his most recent attempt was 8 months ago.  He gives a history of nonsuicidal self-injurious behavior by cutting and scratching himself to change the way he feels.  He began cutting himself at age 83 and estimates he does so approximately once per year.    Past Medical History:  Past Medical History:  Diagnosis Date  . Anxiety   . Anxiety disorder   . Asthma   . Bipolar 1 disorder ([place])   . Depression   . Methamphetamine abuse ([place])   . PTSD (post-traumatic stress disorder)     Past Surgical History:  Procedure Laterality Date  . CLOSED REDUCTION MANDIBLE N/A 07/02/2018   Procedure: CLOSED  REDUCTION MANDIBULAR;  Surgeon: Margaretha Sheffield, MD;  Location: [facility] ORS;  Service: ENT;  Laterality: N/A;   Family History:  Family History  Problem Relation Age of Onset  . Asthma Mother   . Cancer Mother        breast cancer  . Ulcers Mother   . Cancer Father        esophogeal cancer  . Hypertension Father   . Asthma Brother    Family Psychiatric  History: The patient states that his mother has some sort of mental health issues and experiences mood swings.  He has a brother who either committed suicide or took an accidental overdose.  He reports that his mother has issues with alcohol and substances.  Social History:  Social History   Substance and Sexual Activity  Alcohol Use Not Currently  . Alcohol/week: 7.0 standard drinks  . Types: 7 Standard drinks or equivalent per week   Comment: previously heavy drinker 2016. most recent 4 beer/ day drinker and none since 01-11-2020     Social History   Substance and Sexual Activity  Drug Use Yes  . Types: Methamphetamines   Comment: last used 09/07/20    Social History   Socioeconomic History  . Marital status: Single    Spouse name: Not on file  . Number of children: Not on file  . Years of education: Not on file  . Highest education level: Not on file  Occupational History  . Not on file  Tobacco Use  . Smoking status: Current Every Day Smoker    Packs/day: 0.50    Years: 8.00    Pack years: 4.00    Types: Cigarettes  . Smokeless tobacco: Never Used  Vaping Use  . Vaping Use: Every day  Substance and Sexual Activity  . Alcohol use: Not Currently    Alcohol/week: 7.0 standard drinks    Types: 7 Standard drinks or equivalent per week    Comment: previously heavy drinker 2016. most recent 4 beer/ day drinker and none since 01-11-2020  . Drug use: Yes    Types: Methamphetamines    Comment: last used 09/07/20  . Sexual activity: Not on file  Other Topics Concern  . Not on file  Social History Narrative  . Not on  file   Social Determinants of Health   Financial Resource Strain: Not on file  Food Insecurity: Not on file  Transportation Needs: Not on file  Physical Activity: Not on file  Stress: Not on file  Social Connections: Not on file   Additional Social History:    Pain Medications: See MAR Prescriptions: See MAR Over the Counter: See MAR History of alcohol / drug use?: Yes Negative Consequences of Use: Personal relationships,Legal Withdrawal Symptoms: Agitation,Irritability  Sleep: Good  Appetite:  Fair  Current Medications: Current Facility-Administered Medications  Medication Dose Route Frequency Provider Last Rate Last Admin  . acetaminophen (TYLENOL) tablet 650 mg  650 mg Oral Q6H PRN Thomes Lolling H, NP      . albuterol (VENTOLIN HFA) 108 (90 Base) MCG/ACT inhaler 2 puff  2 puff Inhalation Q6H PRN Myles Lipps  Kellie Simmering, MD   2 puff at 09/12/20 1902  . alum & mag hydroxide-simeth (MAALOX/MYLANTA) 200-200-20 MG/5ML suspension 30 mL  30 mL Oral Q4H PRN Revonda Humphrey, NP      . gabapentin (NEURONTIN) capsule 200 mg  200 mg Oral TID Ethelene Hal, NP   200 mg at 09/13/20 0824  . magnesium hydroxide (MILK OF MAGNESIA) suspension 30 mL  30 mL Oral Daily PRN Revonda Humphrey, NP      . QUEtiapine (SEROQUEL) tablet 100 mg  100 mg Oral QHS Ethelene Hal, NP   100 mg at 09/12/20 2115  . sertraline (ZOLOFT) tablet 50 mg  50 mg Oral QHS Ethelene Hal, NP   50 mg at 09/12/20 2115   Lab Results:  Results for orders placed or performed during the hospital encounter of 09/10/20 (from the past 48 hour(s))  T4, free     Status: None   Collection Time: 09/12/20  6:42 AM  Result Value Ref Range   Free T4 0.81 0.61 - 1.12 ng/dL    Comment: (NOTE) Biotin ingestion may interfere with free T4 tests. If the results are inconsistent with the TSH level, previous test results, or the clinical presentation, then consider biotin interference. If needed, order repeat  testing after stopping biotin. Performed at [facility], [place] [address] [place], [place] [address]   T3     Status: None   Collection Time: 09/12/20  6:42 AM  Result Value Ref Range   T3, Total 106 71 - 180 ng/dL    Comment: (NOTE) Performed At: [facility] [place], [place] [id number] Rush Farmer MD MH:[id number]   Comprehensive metabolic panel     Status: Abnormal   Collection Time: 09/12/20  6:42 AM  Result Value Ref Range   Sodium 138 135 - 145 mmol/L   Potassium 4.1 3.5 - 5.1 mmol/L   Chloride 103 98 - 111 mmol/L   CO2 27 22 - 32 mmol/L   Glucose, Bld 107 (H) 70 - 99 mg/dL    Comment: Glucose reference range applies only to samples taken after fasting for at least 8 hours.   BUN 14 6 - 20 mg/dL   Creatinine, Ser 0.82 0.61 - 1.24 mg/dL   Calcium 9.2 8.9 - 10.3 mg/dL   Total Protein 7.1 6.5 - 8.1 g/dL   Albumin 4.0 3.5 - 5.0 g/dL   AST 29 15 - 41 U/L   ALT 27 0 - 44 U/L   Alkaline Phosphatase 60 38 - 126 U/L   Total Bilirubin 0.5 0.3 - 1.2 mg/dL   GFR, Estimated >60 >60 mL/min    Comment: (NOTE) Calculated using the CKD-EPI Creatinine Equation (2021)    Anion gap 8 5 - 15    Comment: Performed at [facility], [place] [address], [place], [place] [address]   Blood Alcohol level:  Lab Results  Component Value Date   [facility] <10 09/09/2020   ETH <10 59/29/2446   Metabolic Disorder Labs: Lab Results  Component Value Date   HGBA1C 6.2 (H) 09/11/2020   MPG 131.24 09/11/2020   No results found for: PROLACTIN Lab Results  Component Value Date   CHOL 105 09/11/2020   TRIG 80 09/11/2020   HDL 29 (L) 09/11/2020   CHOLHDL 3.6 09/11/2020   VLDL 16 09/11/2020   LDLCALC 60 09/11/2020   LDLCALC 74 07/11/2014   Physical Findings: AIMS: Facial and Oral Movements Muscles of Facial Expression: None,  normal Lips and Perioral Area: None, normal Jaw: None, normal Tongue: None, normal,Extremity Movements Upper (arms, wrists,  hands, fingers): None, normal Lower (legs, knees, ankles, toes): None, normal, Trunk Movements Neck, shoulders, hips: None, normal, Overall Severity Severity of abnormal movements (highest score from questions above): None, normal Incapacitation due to abnormal movements: None, normal Patient's awareness of abnormal movements (rate only patient's report): No Awareness, Dental Status Current problems with teeth and/or dentures?: No Does patient usually wear dentures?: No  CIWA:  CIWA-Ar Total: 1 COWS:  COWS Total Score: 2  Musculoskeletal: Strength & Muscle Tone: within normal limits Gait & Station: normal Patient leans: N/A  Psychiatric Specialty Exam:  Presentation  General Appearance: Casual.  Eye Contact:Good  Speech:Clear and Coherent; Normal Rate  Speech Volume:Normal  Handedness:Right  Mood and Affect  Mood: Improving. Affect: Good/reactive.  Thought Process  Thought Processes:Coherent; Goal Directed; Linear  Descriptions of Associations:Intact  Orientation:Full (Time, Place and Person)  Thought Content:Logical; Rumination  History of Schizophrenia/Schizoaffective disorder:No  Duration of Psychotic Symptoms:No data recorded Hallucinations:Hallucinations: None  Ideas of Reference:None  Suicidal Thoughts:Suicidal Thoughts: Yes, Passive SI Passive Intent and/or Plan: Without Intent; Without Plan  Homicidal Thoughts:Homicidal Thoughts: No  Sensorium  Memory:Immediate Good; Recent Good; Remote Good  Judgment:Fair  Insight:Fair  Executive Functions  Concentration:Good  Attention Span:Good  Recall:Good  Fund of Knowledge:Good  Language:Good  Psychomotor Activity  Psychomotor Activity:Psychomotor Activity: Normal  Assets  Assets:Communication Skills; Desire for Improvement; Resilience; Social Support  Sleep  Sleep:Sleep: Good Number of Hours of Sleep: 6.75  Physical Exam: Physical Exam Vitals and nursing note reviewed.  HENT:      Head: Normocephalic and atraumatic.     Nose: Nose normal.     Mouth/Throat:     Pharynx: Oropharynx is clear.  Eyes:     Pupils: Pupils are equal, round, and reactive to light.  Cardiovascular:     Rate and Rhythm: Normal rate.  Pulmonary:     Effort: Pulmonary effort is normal.  Genitourinary:    Comments: Deferred Musculoskeletal:        General: Normal range of motion.     Cervical back: Normal range of motion.  Skin:    General: Skin is warm and dry.  Neurological:     General: No focal deficit present.     Mental Status: He is alert and oriented to person, place, and time.    Review of Systems  Constitutional: Negative for chills, diaphoresis, fever and malaise/fatigue.  HENT: Negative.   Eyes: Negative.   Respiratory: Negative.   Cardiovascular: Negative.   Gastrointestinal: Negative.   Genitourinary: Negative.   Musculoskeletal: Negative.   Skin: Negative.   Neurological: Negative.   Endo/Heme/Allergies:       Allergies: NKDA  Psychiatric/Behavioral: Positive for depression ("Improving") and substance abuse (Hx. Meth. use disorder). Negative for hallucinations, memory loss and suicidal ideas. The patient is not nervous/anxious and does not have insomnia.    Blood pressure 98/60, pulse 63, temperature 98 F (36.7 C), temperature source Oral, resp. rate 18, height 5' 8"  (1.727 m), weight 103.9 kg, SpO2 98 %. Body mass index is 34.82 kg/m.  Treatment Plan Summary: Daily contact with patient to assess and evaluate symptoms and progress in treatment and Medication management.   Continue inpatient hospitalization.  Will continue today 09/13/2020 plan as below except where it is noted.   Continue every 15-minute observation status  Encouraged participation in group therapy and therapeutic milieu.  Depression/anxiety -Continue Zoloft 50 mg daily for depression  and anxiety -Continue quetiapine 100 mg QHS for adjunctive treatment of mood and sleep -Continue  gabapentin 200 mg 3 times daily for adjunctive treatment of anxiety -Have discontinued hydroxyzine 25 mg 3 times daily PRN Zaidi to reduce likelihood of oversedation with at bedtime as needed medications  Insomnia -Have discontinued PRN trazodone due to patient fatigue this 2 mornings ago. -We will continue Seroquel 100 mg QHS for adjunctive treatment of mood and sleep  Substance use disorder -Patient would like to attend residential substance abuse treatment program at [facility] in [place].  Social work is investigating whether patient can be accepted to this program. -Patient is also interested in going to [facility] in the [place] area if he cannot get into residential substance abuse treatment program.  Disposition planning is in progress  I certify that inpatient services furnished can reasonably be expected to improve the patient's condition.  Nathan Spar, NP 09/13/2020, 12:24 PMPatient ID: Laural Golden, male   DOB: Jul 26, 1990, 30 y.o.   MRN: [id number]

## 2020-09-13 NOTE — [facility] Group Notes (Signed)
Adult Psychoeducational Group Note  Date:  09/13/2020 Time:  2:04 PM  Group Topic/Focus:  Orientation:   The focus of this group is to educate the patient on the purpose and policies of crisis stabilization and provide a format to answer questions about their admission.  The group details unit policies and expectations of patients while admitted.  Participation Level:  Active  Participation Quality:  Appropriate  Affect:  Appropriate  Cognitive:  Alert and Appropriate  Insight: Appropriate and Good  Engagement in Group:  Engaged  Modes of Intervention:  Discussion  Additional Comments:  Pt attended morning goals group.  Nathan Koch 09/13/2020, 2:04 PM

## 2020-09-13 NOTE — [facility] Group Notes (Signed)
Type of Therapy and Topic: Group Therapy: Overcoming Obstacles  Participation Level: Active  Description of Group:  In this group patients will watch a Ted Talk titled, "Why We Should All Try Therapy". In this video, patients explore the benefits of going to therapy and how this choice may effect their life. This video also explores other emotions that may arise by attending therapy. Patients were also given a worksheet titled "Therapy Goals" that allows patients to explore their goals for therapy and how their life may be different once they have completed therapy.    Therapeutic Goals:  1. Patient will identify personal and current emotions as they relate to therapy.  2. Patient will identify barriers that currently interfere with them beginning therapy.  3. Patient will identify two goals they are willing to set for therapy.     Summary of Patient Progress: Pt introduced himself without needing to be prompted and stated that he was happy he has met nice peers at [facility]. Pt was attentive during the TedTalk and but did not participate in the discussion.   Toney Reil, [place] Worker [employer]

## 2020-09-14 MED ORDER — QUETIAPINE FUMARATE 100 MG PO TABS: 100.0000 mg | ORAL_TABLET | Freq: Every day | ORAL | 0 refills | Status: DC

## 2020-09-14 MED ORDER — SERTRALINE HCL 50 MG PO TABS: 50.0000 mg | ORAL_TABLET | Freq: Every day | ORAL | 0 refills | Status: DC

## 2020-09-14 MED ORDER — GABAPENTIN 100 MG PO CAPS: 200.0000 mg | ORAL_CAPSULE | Freq: Three times a day (TID) | ORAL | 0 refills | Status: DC

## 2020-09-14 NOTE — Discharge Summary (Signed)
Physician Discharge Summary Note  Patient:  Nathan Koch is an 30 y.o., male MRN:  [id number] DOB:  1990/11/01 Patient phone:  There is no home phone number on file.  Patient address:   [address]. [place] [address],   Total Time spent with patient: Greater than 30 minutes  Date of Admission:  09/10/2020  Date of Discharge: 09-14-20  Reason for Admission:   Principal Problem: MDD (major depressive disorder), recurrent episode, severe ([name])  Discharge Diagnoses: Principal Problem:   MDD (major depressive disorder), recurrent episode, severe ([name]) Active Problems:   Methamphetamine use disorder, moderate ([name])  Past Psychiatric History: Past history of substance abuse and depression  Past Medical History:  Past Medical History:  Diagnosis Date  . Anxiety   . Anxiety disorder   . Asthma   . Bipolar 1 disorder ([name])   . Depression   . Methamphetamine abuse ([name])   . PTSD (post-traumatic stress disorder)     Past Surgical History:  Procedure Laterality Date  . CLOSED REDUCTION MANDIBLE N/A 07/02/2018   Procedure: CLOSED REDUCTION MANDIBULAR;  Surgeon: Vernie Murders, MD;  Location: [facility] ORS;  Service: ENT;  Laterality: N/A;   Family History:  Family History  Problem Relation Age of Onset  . Asthma Mother   . Cancer Mother        breast cancer  . Ulcers Mother   . Cancer Father        esophogeal cancer  . Hypertension Father   . Asthma Brother    Family Psychiatric  History: See H&P  Social History:  Social History   Substance and Sexual Activity  Alcohol Use Not Currently  . Alcohol/week: 7.0 standard drinks  . Types: 7 Standard drinks or equivalent per week   Comment: previously heavy drinker 2016. most recent 4 beer/ day drinker and none since 01-11-2020     Social History   Substance and Sexual Activity  Drug Use Yes  . Types: Methamphetamines   Comment: last used 09/07/20    Social History   Socioeconomic History  . Marital status:  Single    Spouse name: Not on file  . Number of children: Not on file  . Years of education: Not on file  . Highest education level: Not on file  Occupational History  . Not on file  Tobacco Use  . Smoking status: Current Every Day Smoker    Packs/day: 0.50    Years: 8.00    Pack years: 4.00    Types: Cigarettes  . Smokeless tobacco: Never Used  Vaping Use  . Vaping Use: Every day  Substance and Sexual Activity  . Alcohol use: Not Currently    Alcohol/week: 7.0 standard drinks    Types: 7 Standard drinks or equivalent per week    Comment: previously heavy drinker 2016. most recent 4 beer/ day drinker and none since 01-11-2020  . Drug use: Yes    Types: Methamphetamines    Comment: last used 09/07/20  . Sexual activity: Not on file  Other Topics Concern  . Not on file  Social History Narrative  . Not on file   Social Determinants of Health   Financial Resource Strain: Not on file  Food Insecurity: Not on file  Transportation Needs: Not on file  Physical Activity: Not on file  Stress: Not on file  Social Connections: Not on file   Hospital Course: (Per Md's admission evaluation notes): Nathan Koch is 30 year old male with a past psychiatric history  of depression, anxiety. PTSD, and methamphetamine abuse. This represents the first inpatient admission for this patient. He presented to [facility] with worsening depression and relapse on methamphetamines after 8 months of sobriety.  He lives with his girlfriend/wife and 57 year old daughter. 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 He finished the 9th grade and does not have a GED. He had been at [facility] in [place]. This is a year long program and he left at 8 months because he wanted to go home with his family. His wife also has been clean 1 year from meth. He stated one of his buddies called him and had some meth, both he and his wife used on Friday. His last use was Monday prior to going to the ED. Patient describes his  depressive symptoms as feeling hopeless, worthless, guilt, suicidal ideation, and loss of interest in usual activities. He describes anxiety as feeling restless and worrying about everything. He stated he is sleeping about 9 hours per night but often has difficulty falling and staying asleep. He stated he feels suicidal with a plan to step in front of a bus. He stated he attempted to step into traffic 8 months ago but went to rehab instead. He stated his appetite is good. He denies HI, paranoia, delusions and does not appear to be responding to internal stimuli. He stated his wife and daughter are what he lives for. He has PTSD from witnessing his only brother/sibling die from a heroin overdose in front of him in 08/16/11. He called 911 and attempted CPR. His mother passed away in 08-15-12 from breast cancer, his father is deceased from esophageal cancer in Aug 16, 2007. His maternal aunt and grandmother live in [place], he does not get to see them often but does talk to them. He hopes to be able to go back to [facility] from the hospital and if not he can go home. He has been prescribed Seroquel 100 mg PO qhs for sleep by a provider at [facility].  Gabapentin was added today for amphetamine withdrawal. Will order Zoloft 50 mg qhs for depression.   Prior to this discharge, Nathan Koch was seen & evaluated for mental health stability. The current laboratory findings were reviewed (stable), nurses notes & vital signs were reviewed as well. There are no current mental health or medical issues that should prevent this discharge at this time. Patient is being discharged to continue mental health care as noted below.  After evaluation of his presenting symptoms, it was jointly agreed by the treatment team to recommend [facility] for mood stabilization treatments.The medication regimen targeting those presenting symptoms were discussed & initiated with his consent. He received, stabilized & discharged on the medications as listed below on his  discharge medication lists. He was also enrolled & participated in the group counseling sessions being offered & held on this unit. He learned coping skills. He presented other significant pre-existing medical issues that required treatments & or monitoring. He was resumed & discharged on all his pertinent home medications for those health issues. He tolerated his treatment regimen without any adverse effects or reactions reported.   Nathan Koch symptoms responded well to his treatment regimen warranting this discharge. This is evidenced by his reports of improved mood, absence of suicidal ideations, substance withdrawal symptoms & readiness to be discharged to his home with family. He is currently mentally & medically stable for discharge to continue mental health care as recommended below.    Today upon discharge evaluation with the attending  psychiatrist today, Nathan Koch reports he is doing & feeling a lot better than when first admitted to the hospital. He denies any specific concerns. He is sleeping well. His appetite is good. He denies other physical complaints. He denies SI/HI/AH/VH, delusional thoughts or paranoia. He does not appear to be responding to any internal stimuli. He is tolerating his medications well & in agreement to continue his current regimen as recommended. He will follow up for routine psychiatric care, further substance abuse treatment & medication management as noted below. He is provided with all the necessary information needed to make these appointments without problems.Branndon was able to engage in safety planning including plan to return to [facility] or contact emergency services if he feels unable to maintain his own safety or the safety of others. Pt had no further questions, comments or concerns. He left [facility] with all personal belongings in no apparent distress. Transportation per his family (fiancee).  Physical Findings: AIMS: Facial and Oral Movements Muscles of Facial Expression:  None, normal Lips and Perioral Area: None, normal Jaw: None, normal Tongue: None, normal,Extremity Movements Upper (arms, wrists, hands, fingers): None, normal Lower (legs, knees, ankles, toes): None, normal, Trunk Movements Neck, shoulders, hips: None, normal, Overall Severity Severity of abnormal movements (highest score from questions above): None, normal Incapacitation due to abnormal movements: None, normal Patient's awareness of abnormal movements (rate only patient's report): No Awareness, Dental Status Current problems with teeth and/or dentures?: No Does patient usually wear dentures?: No  CIWA:  CIWA-Ar Total: 1 COWS:  COWS Total Score: 2  Musculoskeletal: Strength & Muscle Tone: within normal limits Gait & Station: normal Patient leans: N/A  Psychiatric Specialty Exam: Physical Exam Constitutional:      Appearance: He is well-developed.  HENT:     Head: Normocephalic and atraumatic.     Mouth/Throat:     Pharynx: Oropharynx is clear.  Eyes:     Pupils: Pupils are equal, round, and reactive to light.  Cardiovascular:     Heart sounds: Normal heart sounds.  Pulmonary:     Effort: Pulmonary effort is normal.  Abdominal:     Palpations: Abdomen is soft.  Genitourinary:    Comments: Deferred Musculoskeletal:        General: Normal range of motion.     Cervical back: Normal range of motion.  Skin:    General: Skin is warm and dry.  Neurological:     General: No focal deficit present.     Mental Status: He is alert and oriented to person, place, and time. Mental status is at baseline.  Psychiatric:        Mood and Affect: Mood normal.     Review of Systems  Constitutional: Negative.   HENT: Negative.   Eyes: Negative.   Respiratory: Negative.   Cardiovascular: Negative.   Gastrointestinal: Negative.   Endocrine: Negative.   Genitourinary: Negative.   Musculoskeletal: Negative.   Skin: Negative.   Allergic/Immunologic:       Allergies: NKDA   Neurological: Negative.   Hematological: Negative.   Psychiatric/Behavioral: Positive for dysphoric mood (Hx. of (stable omn medication)) and sleep disturbance (Hx. of (stable on medication)). Negative for agitation, behavioral problems, confusion, decreased concentration, hallucinations, self-injury and suicidal ideas (hx of (stable)). The patient is not nervous/anxious (Stable upon discharge) and is not hyperactive.     Blood pressure 98/60, pulse 63, temperature 98 F (36.7 C), temperature source Oral, resp. rate 18, height  (1.727 m), weight 103.9 kg, SpO2 98 %.Body mass  index is 34.82 kg/m.  General Appearance: See Md's discharge SRA  Sleep:  Number of Hours: 6.75   Have you used any form of tobacco in the last 30 days? (Cigarettes, Smokeless Tobacco, Cigars, and/or Pipes): Yes  Has this patient used any form of tobacco in the last 30 days? (Cigarettes, Smokeless Tobacco, Cigars, and/or Pipes): N/A  Blood Alcohol level:  Lab Results  Component Value Date   ETH <10 09/09/2020   ETH <10 11/30/2019    Metabolic Disorder Labs:  Lab Results  Component Value Date   HGBA1C 6.2 (H) 09/11/2020   MPG 131.24 09/11/2020   No results found for: PROLACTIN Lab Results  Component Value Date   CHOL 105 09/11/2020   TRIG 80 09/11/2020   HDL 29 (L) 09/11/2020   CHOLHDL 3.6 09/11/2020   VLDL 16 09/11/2020   LDLCALC 60 09/11/2020   LDLCALC 74 07/11/2014    See Psychiatric Specialty Exam and Suicide Risk Assessment completed by Attending Physician prior to discharge.  Discharge destination:  Home  Is patient on multiple antipsychotic therapies at discharge:  No   Has Patient had three or more failed trials of antipsychotic monotherapy by history:  No  Recommended Plan for Multiple Antipsychotic Therapies: NA  Allergies as of 09/14/2020   No Known Allergies     Medication List    STOP taking these medications   amoxicillin 500 MG capsule Commonly known as: AMOXIL      TAKE these medications     Indication  acetaminophen 500 MG tablet Commonly known as: TYLENOL Take 500 mg by mouth every 6 (six) hours as needed for headache or moderate pain (back pain, headache).  Indication: Fever, Pain   albuterol 108 (90 Base) MCG/ACT inhaler Commonly known as: VENTOLIN HFA Inhale 2 puffs into the lungs every 6 (six) hours as needed for wheezing or shortness of breath.  Indication: Chronic Obstructive Lung Disease   gabapentin 100 MG capsule Commonly known as: NEURONTIN Take 2 capsules (200 mg total) by mouth 3 (three) times daily. For agitation  Indication: Agitation, anxiety/amphetamine withdrawal   QUEtiapine 100 MG tablet Commonly known as: SEROQUEL Take 1 tablet (100 mg total) by mouth at bedtime. For mood control What changed: additional instructions  Indication: Major Depressive Disorder, Mood control   sertraline 50 MG tablet Commonly known as: ZOLOFT Take 1 tablet (50 mg total) by mouth at bedtime. For depression  Indication: Major Depressive Disorder       Follow-up Information    Services, [facility]. Go on 09/17/2020.   Why: You have a hospital follow up appointment for therapy (SAIOP) and medication management services on 09/17/20 at 8:00 am.  This appointment will be held in person. Please bring photo ID, insurance card and proof of any income. Contact information: [address] [place] [id number] [phone]              Follow-up recommendations:  Activity:  As tolerated Diet: As recommended by your primary care doctor. Keep all scheduled follow-up appointments as recommended.  Comments: Prescriptions given at discharge.  Patient agreeable to plan.  Given opportunity to ask questions.  Appears to feel comfortable with discharge denies any current suicidal or homicidal thought. Patient is also instructed prior to discharge to: Take all medications as prescribed by his/her mental healthcare provider. Report any  adverse effects and or reactions from the medicines to his/her outpatient provider promptly. Patient has been instructed & cautioned: To not engage in alcohol and or illegal  drug use while on prescription medicines. In the event of worsening symptoms, patient is instructed to call the crisis hotline, 911 and or go to the nearest ED for appropriate evaluation and treatment of symptoms. To follow-up with his/her primary care provider for your other medical issues, concerns and or health care needs.  Signed: Armandina Stammer, NP, pmhnp, fnp-bc 09/14/2020, 9:26 AM

## 2020-09-14 NOTE — [facility] Group Notes (Signed)
.  Psychoeducational Group Note  Date: 09/14/2020 Time: 0900-1000    Goal Setting   Purpose of Group: This group helps to provide patients with the steps of setting a goal that is specific, measurable, attainable, realistic and time specific. A discussion on how we keep ourselves stuck with negative self talk.    Participation Level:  Active  Participation Quality:  Appropriate  Affect:  Appropriate  Cognitive:  Appropriate  Insight:  Improving  Engagement in Group:  Engaged  Additional Comments:  Rates his energy at a 9/10. States he feels as though he is doing a lot better.  Nathan Koch

## 2020-09-14 NOTE — Progress Notes (Signed)
RN met with pt and reviewed pt's discharge instructions.  Pt verbalized understanding of discharge instructions and pt did not have any questions. RN reviewed and provided pt with a copy of SRA, AVS and Transition Record.  RN returned pt's belongings to pt.  Medication  Samples were given to pt.  Pt denied SI/HI/AVH and voiced no concerns.  Pt was appreciative of the care pt received at [facility].  Patient discharged to the lobby without incident.

## 2020-09-14 NOTE — [facility] Suicide Risk Assessment (Signed)
[facility] Discharge Suicide Risk Assessment   Principal Problem: MDD (major depressive disorder), recurrent episode, severe ([name]) Discharge Diagnoses: Principal Problem:   MDD (major depressive disorder), recurrent episode, severe ([name]) Active Problems:   Methamphetamine use disorder, moderate ([name])   Total Time spent with patient: 15 minutes  Musculoskeletal: Strength & Muscle Tone: within normal limits Gait & Station: normal Patient leans: N/A  Psychiatric Specialty Exam  Presentation  General Appearance: Appropriate for Environment  Eye Contact:Good  Speech:Normal Rate; Clear and Coherent  Speech Volume:Normal  Handedness:Right   Mood and Affect  Mood:Euthymic  Duration of Depression Symptoms: Less than two weeks  Affect:Congruent   Thought Process  Thought Processes:Coherent  Descriptions of Associations:Intact  Orientation:Full (Time, Place and Person)  Thought Content:Logical  History of Schizophrenia/Schizoaffective disorder:No  Duration of Psychotic Symptoms:No data recorded Hallucinations:No data recorded Ideas of Reference:None  Suicidal Thoughts:Suicidal Thoughts: No  Homicidal Thoughts:Homicidal Thoughts: No   Sensorium  Memory:Immediate Fair; Recent Fair; Remote Fair  Judgment:Fair  Insight:Fair   Executive Functions  Concentration:Fair  Attention Span:Good  Recall:Good  Fund of Knowledge:Good  Language:Good   Psychomotor Activity  Psychomotor Activity:Psychomotor Activity: Normal   Assets  Assets:Desire for Improvement; Resilience   Sleep  Sleep:Sleep: Good Number of Hours of Sleep: 6.75   Physical Exam: Physical Exam Vitals and nursing note reviewed.  Constitutional:      Appearance: Normal appearance.  HENT:     Head: Normocephalic and atraumatic.  Pulmonary:     Effort: Pulmonary effort is normal.  Neurological:     General: No focal deficit present.     Mental Status: He is alert.    Review of  Systems  All other systems reviewed and are negative.  Blood pressure 98/60, pulse 63, temperature 98 F (36.7 C), temperature source Oral, resp. rate 18, height 5\' 8"  (1.727 m), weight 103.9 kg, SpO2 98 %. Body mass index is 34.82 kg/m.  Mental Status Per Nursing Assessment::   On Admission:  NA  Demographic Factors:  Male, Low socioeconomic status and Unemployed  Loss Factors: Financial problems/change in socioeconomic status  Historical Factors: Impulsivity  Risk Reduction Factors:   Positive social support  Continued Clinical Symptoms:  Depression:   Comorbid alcohol abuse/dependence Impulsivity Alcohol/Substance Abuse/Dependencies  Cognitive Features That Contribute To Risk:  None    Suicide Risk:  Minimal: No identifiable suicidal ideation.  Patients presenting with no risk factors but with morbid ruminations; may be classified as minimal risk based on the severity of the depressive symptoms   Follow-up Information    [facility], [facility]. Go on 09/17/2020.   Why: You have a hospital follow up appointment for therapy (SAIOP) and medication management services on 09/17/20 at 8:00 am.  This appointment will be held in person. Please bring photo ID, insurance card and proof of any income. Contact information: [address] [place] [phone]               Plan Of Care/Follow-up recommendations:  Activity:  ad lib  [phone], MD 09/14/2020, 9:08 AM

## 2020-09-14 NOTE — Progress Notes (Signed)
   09/13/20 1300  Psych Admission Type (Psych Patients Only)  Admission Status Voluntary  Psychosocial Assessment  Patient Complaints Anxiety;Depression  Eye Contact Fair  Facial Expression Anxious  Affect Appropriate to circumstance  Speech Logical/coherent  Interaction Assertive  Motor Activity  (normal)  Appearance/Hygiene In scrubs  Behavior Characteristics Anxious  Mood Anxious;Depressed  Thought Process  Coherency WDL  Content WDL  Delusions None reported or observed  Perception WDL  Hallucination None reported or observed  Judgment Poor  Confusion None  Danger to Self  Current suicidal ideation? Denies  Danger to Others  Danger to Others None reported or observed

## 2020-09-14 NOTE — Progress Notes (Signed)
  [facility] Adult Case Management Discharge Plan :  Will you be returning to the same living situation after discharge:  Yes,  with fiancee At discharge, do you have transportation home?: Yes,  fiancee Do you have the ability to pay for your medications: No.   Needs assistance from community organization  Release of information consent forms completed and emailed to Medical Records, then turned in to Medical Records by CSW.   Patient to Follow up at:  Follow-up Information    [facility]. Go on 09/17/2020.   Why: You have a hospital follow up appointment for therapy (SAIOP) and medication management services on 09/17/20 at 8:00 am.  This appointment will be held in person. Please bring photo ID, insurance card and proof of any income. Contact information: [address] [place] [address] [phone]               Next level of care provider has access to [facility] Link:no  Safety Planning and Suicide Prevention discussed: Yes,  with fiancee  Have you used any form of tobacco in the last 30 days? (Cigarettes, Smokeless Tobacco, Cigars, and/or Pipes): Yes  Has patient been referred to the Quitline?: Patient refused referral  Patient has been referred for addiction treatment: Yes  Lynnell Chad, LCSW 09/14/2020, 9:42 AM

## 2020-09-14 NOTE — [facility] Suicide Risk Assessment (Signed)
[facility] INPATIENT:  Family/Significant Other Suicide Prevention Education  Suicide Prevention Education:  Education Completed; Cheri Guppy [phone] (Fiance),  (name of family member/significant other) has been identified by the patient as the family member/significant other with whom the patient will be residing, and identified as the person(s) who will aid the patient in the event of a mental health crisis (suicidal ideations/suicide attempt).  With written consent from the patient, the family member/significant other has been provided the following suicide prevention education, prior to the and/or following the discharge of the patient.  Steffanie Rainwater states she is familiar with patient's depressive episodes, but she was welcoming of additional information.  She will come pick patient up at 10:00am.  The suicide prevention education provided includes the following:  Suicide risk factors  Suicide prevention and interventions  National Suicide Hotline telephone number  [facility] assessment telephone number  [facility] Emergency Assistance 911  [facility] and/or Residential Mobile Crisis Unit telephone number  Request made of family/significant other to:  Remove weapons (e.g., guns, rifles, knives), all items previously/currently identified as safety concern.    Remove drugs/medications (over-the-counter, prescriptions, illicit drugs), all items previously/currently identified as a safety concern.  The family member/significant other verbalizes understanding of the suicide prevention education information provided.  The family member/significant other agrees to remove the items of safety concern listed above.  Carloyn Jaeger Grossman-Orr 09/14/2020, 9:39 AM

## 2020-09-15 ENCOUNTER — Emergency Department (HOSPITAL_COMMUNITY)
Admission: EM | Admit: 2020-09-15 | Discharge: 2020-09-16 | Disposition: A | Discharge status: Home / Self Care | Payer: Self-pay | Attending: Emergency Medicine | Admitting: Emergency Medicine

## 2020-09-15 ENCOUNTER — Encounter (HOSPITAL_COMMUNITY): Payer: Self-pay | Admitting: Emergency Medicine

## 2020-09-15 ENCOUNTER — Other Ambulatory Visit: Payer: Self-pay

## 2020-09-15 DIAGNOSIS — F17210 Nicotine dependence, cigarettes, uncomplicated: Secondary | ICD-10-CM | POA: Insufficient documentation

## 2020-09-15 DIAGNOSIS — F32A Depression, unspecified: Secondary | ICD-10-CM | POA: Insufficient documentation

## 2020-09-15 DIAGNOSIS — F1590 Other stimulant use, unspecified, uncomplicated: Secondary | ICD-10-CM | POA: Insufficient documentation

## 2020-09-15 DIAGNOSIS — T43625A Adverse effect of amphetamines, initial encounter: Secondary | ICD-10-CM | POA: Insufficient documentation

## 2020-09-15 DIAGNOSIS — J45909 Unspecified asthma, uncomplicated: Secondary | ICD-10-CM | POA: Insufficient documentation

## 2020-09-15 DIAGNOSIS — F1520 Other stimulant dependence, uncomplicated: Secondary | ICD-10-CM | POA: Diagnosis present

## 2020-09-15 DIAGNOSIS — R45851 Suicidal ideations: Secondary | ICD-10-CM | POA: Insufficient documentation

## 2020-09-15 DIAGNOSIS — F332 Major depressive disorder, recurrent severe without psychotic features: Secondary | ICD-10-CM | POA: Diagnosis present

## 2020-09-15 LAB — COMPREHENSIVE METABOLIC PANEL
ALT: 28 U/L (ref 0–44)
AST: 37 U/L (ref 15–41)
Albumin: 4.9 g/dL (ref 3.5–5.0)
Alkaline Phosphatase: 72 U/L (ref 38–126)
Anion gap: 10 (ref 5–15)
BUN: 13 mg/dL (ref 6–20)
CO2: 26 mmol/L (ref 22–32)
Calcium: 9.9 mg/dL (ref 8.9–10.3)
Chloride: 106 mmol/L (ref 98–111)
Creatinine, Ser: 1.11 mg/dL (ref 0.61–1.24)
GFR, Estimated: >60 mL/min (ref >60)
Glucose, Bld: 123 mg/dL — ABNORMAL HIGH (ref 70–99)
Potassium: 4.1 mmol/L (ref 3.5–5.1)
Sodium: 142 mmol/L (ref 135–145)
Total Bilirubin: 1.1 mg/dL (ref 0.3–1.2)
Total Protein: 8.8 g/dL — ABNORMAL HIGH (ref 6.5–8.1)

## 2020-09-15 LAB — CBC WITH DIFFERENTIAL/PLATELET
Abs Immature Granulocytes: 0.05 10*3/uL (ref 0.00–0.07)
Basophils Absolute: 0 10*3/uL (ref 0.0–0.1)
Basophils Relative: 0 %
Eosinophils Absolute: 0 10*3/uL (ref 0.0–0.5)
Eosinophils Relative: 0 %
HCT: 45.8 % (ref 39.0–52.0)
Hemoglobin: 15.2 g/dL (ref 13.0–17.0)
Immature Granulocytes: 0 %
Lymphocytes Relative: 12 %
Lymphs Abs: 1.6 10*3/uL (ref 0.7–4.0)
MCH: 28.5 pg (ref 26.0–34.0)
MCHC: 33.2 g/dL (ref 30.0–36.0)
MCV: 85.8 fL (ref 80.0–100.0)
Monocytes Absolute: 1 10*3/uL (ref 0.1–1.0)
Monocytes Relative: 8 %
Neutro Abs: 10.2 10*3/uL — ABNORMAL HIGH (ref 1.7–7.7)
Neutrophils Relative %: 80 %
Platelets: 432 10*3/uL — ABNORMAL HIGH (ref 150–400)
RBC: 5.34 MIL/uL (ref 4.22–5.81)
RDW: 13.8 % (ref 11.5–15.5)
WBC: 12.8 10*3/uL — ABNORMAL HIGH (ref 4.0–10.5)
nRBC: 0 % (ref 0.0–0.2)

## 2020-09-15 LAB — RAPID URINE DRUG SCREEN, HOSP PERFORMED
Amphetamines: POSITIVE — AB
Barbiturates: NOT DETECTED
Benzodiazepines: NOT DETECTED
Cocaine: NOT DETECTED
Opiates: NOT DETECTED
Tetrahydrocannabinol: NOT DETECTED

## 2020-09-15 LAB — ETHANOL: Alcohol, Ethyl (B): <10 mg/dL (ref <10)

## 2020-09-15 NOTE — ED Notes (Signed)
Patient given meal tray at this time and plan to hold overnight and reevaluate in the morning.

## 2020-09-15 NOTE — ED Notes (Signed)
Patient has on psychiatric scrubs and has been wanded. Patient belongings sent to ED storage room.

## 2020-09-15 NOTE — ED Provider Notes (Signed)
[facility] EMERGENCY DEPARTMENT Provider Note   CSN: [id number] Arrival date & time: 09/15/20  1505     History Chief Complaint  Patient presents with  . Suicidal    Suicidal due to meth use today    Nathan Koch is a 30 y.o. male.  Patient took methamphetamines today and now states he wants to kill himself.  He recently was at behavioral health  The history is provided by the patient and medical records. No language interpreter was used.  Altered Mental Status Presenting symptoms: behavior changes   Severity:  Moderate Most recent episode:  Today Episode history:  Multiple Timing:  Constant Progression:  Worsening Chronicity:  Recurrent Context: not alcohol use   Associated symptoms: no abdominal pain, no hallucinations, no headaches, no rash and no seizures        Past Medical History:  Diagnosis Date  . Anxiety   . Anxiety disorder   . Asthma   . Bipolar 1 disorder ([name])   . Depression   . Methamphetamine abuse ([name])   . PTSD (post-traumatic stress disorder)     Patient Active Problem List   Diagnosis Date Noted  . Amphetamine-induced depressive disorder   . MDD (major depressive disorder), recurrent episode, severe ([name]) 09/11/2020  . MDD (major depressive disorder), recurrent severe, without psychosis ([name]) 09/10/2020  . Suicidal ideation   . 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 Methamphetamine use disorder, moderate ([name]) 09/14/2019  . MDD (major depressive disorder), recurrent, severe, with psychosis ([name]) 09/14/2019  . MDD (major depressive disorder), single episode, severe with psychosis ([name]) 09/14/2019  . Asthma 06/26/2014    Past Surgical History:  Procedure Laterality Date  . CLOSED REDUCTION MANDIBLE N/A 07/02/2018   Procedure: CLOSED REDUCTION MANDIBULAR;  Surgeon: Vernie Murders, MD;  Location: [facility] ORS;  Service: ENT;  Laterality: N/A;       Family History  Problem Relation Age of Onset  . Asthma  Mother   . Cancer Mother        breast cancer  . Ulcers Mother   . Cancer Father        esophogeal cancer  . Hypertension Father   . Asthma Brother     Social History   Tobacco Use  . Smoking status: Current Every Day Smoker    Packs/day: 0.50    Years: 8.00    Pack years: 4.00    Types: Cigarettes  . Smokeless tobacco: Never Used  Vaping Use  . Vaping Use: Every day  . Substances: Nicotine, Flavoring  Substance Use Topics  . Alcohol use: Not Currently    Alcohol/week: 7.0 standard drinks    Types: 7 Standard drinks or equivalent per week    Comment: previously heavy drinker 2016. most recent 4 beer/ day drinker and none since 01-11-2020  . Drug use: Yes    Types: Methamphetamines    Comment: last used 09/15/20    Home Medications Prior to Admission medications   Medication Sig Start Date End Date Taking? Authorizing Provider  gabapentin (NEURONTIN) 100 MG capsule Take 2 capsules (200 mg total) by mouth 3 (three) times daily. For agitation 09/14/20   Armandina Stammer I, NP  QUEtiapine (SEROQUEL) 100 MG tablet Take 1 tablet (100 mg total) by mouth at bedtime. For mood control 09/14/20   Armandina Stammer I, NP  sertraline (ZOLOFT) 50 MG tablet Take 1 tablet (50 mg total) by mouth at bedtime. For depression 09/14/20   Sanjuana Kava, NP  Allergies    Patient has no known allergies.  Review of Systems   Review of Systems  Constitutional: Negative for appetite change and fatigue.  HENT: Negative for congestion, ear discharge and sinus pressure.   Eyes: Negative for discharge.  Respiratory: Negative for cough.   Cardiovascular: Negative for chest pain.  Gastrointestinal: Negative for abdominal pain and diarrhea.  Genitourinary: Negative for frequency and hematuria.  Musculoskeletal: Negative for back pain.  Skin: Negative for rash.  Neurological: Negative for seizures and headaches.  Psychiatric/Behavioral: Negative for hallucinations.       Depressed suicidal    Physical  Exam Updated Vital Signs BP (!) 157/97 (BP Location: Right Arm)   Pulse (!) 116   Temp 98.6 F (37 C) (Oral)   Resp 19   Ht 5\' 9"  (1.753 m)   Wt 95.3 kg   SpO2 97%   BMI 31.01 kg/m   Physical Exam Vitals and nursing note reviewed.  Constitutional:      Appearance: He is well-developed.  HENT:     Head: Normocephalic.     Nose: Nose normal.  Eyes:     General: No scleral icterus.    Conjunctiva/sclera: Conjunctivae normal.  Neck:     Thyroid: No thyromegaly.  Cardiovascular:     Rate and Rhythm: Normal rate and regular rhythm.     Heart sounds: No murmur heard. No friction rub. No gallop.   Pulmonary:     Breath sounds: No stridor. No wheezing or rales.  Chest:     Chest wall: No tenderness.  Abdominal:     General: There is no distension.     Tenderness: There is no abdominal tenderness. There is no rebound.  Musculoskeletal:        General: Normal range of motion.     Cervical back: Neck supple.  Lymphadenopathy:     Cervical: No cervical adenopathy.  Skin:    Findings: No erythema or rash.  Neurological:     Mental Status: He is alert and oriented to person, place, and time.     Motor: No abnormal muscle tone.     Coordination: Coordination normal.  Psychiatric:     Comments: Suicidal     ED Results / Procedures / Treatments   Labs (all labs ordered are listed, but only abnormal results are displayed) Labs Reviewed  CBC WITH DIFFERENTIAL/PLATELET - Abnormal; Notable for the following components:      Result Value   WBC 12.8 (*)    Platelets 432 (*)    Neutro Abs 10.2 (*)    All other components within normal limits  COMPREHENSIVE METABOLIC PANEL - Abnormal; Notable for the following components:   Glucose, Bld 123 (*)    Total Protein 8.8 (*)    All other components within normal limits  RAPID URINE DRUG SCREEN, HOSP PERFORMED - Abnormal; Notable for the following components:   Amphetamines POSITIVE (*)    All other components within normal limits   ETHANOL    EKG None  Radiology No results found.  Procedures Procedures   Medications Ordered in ED Medications - No data to display  ED Course  I have reviewed the triage vital signs and the nursing notes.  Pertinent labs & imaging results that were available during my care of the patient were reviewed by me and considered in my medical decision making (see chart for details).    MDM Rules/Calculators/A&P  Patient with depression and suicidal ideation and methamphetamine use.  He will be evaluated by behavioral health again in the morning Final Clinical Impression(s) / ED Diagnoses Final diagnoses:  None    Rx / DC Orders ED Discharge Orders    None       Bethann Berkshire, MD 09/15/20 2309
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
## 2020-09-15 NOTE — ED Notes (Signed)
Patient in room 17 at this time with telepsych

## 2020-09-15 NOTE — ED Triage Notes (Signed)
Pt to the ED [facility] where the patient was picked up from the side of there road.  Pt states he and his girlfriend had an argument and he snorted 1 gram of meth.  Pt would like help with detox.   Pt states he is SI with no plan.

## 2020-09-16 DIAGNOSIS — F1520 Other stimulant dependence, uncomplicated: Secondary | ICD-10-CM

## 2020-09-16 NOTE — Progress Notes (Signed)
Assessed by psychiatry 09/16/2020 0830 Case consulted with Dr. Lucianne Muss.  Patient seen by this provider. Patient is psychiatrically cleared.

## 2020-09-16 NOTE — ED Provider Notes (Signed)
Cleared by behavioral health for discharge and follow-up with [facility].   Vanetta Mulders, MD 09/16/20 [phone]

## 2020-09-16 NOTE — ED Notes (Signed)
Pt no longer wants to stay. Informed Dr Bebe Shaggy. Pt made aware that he was to be re-evaluated by psych in the am and that if he tried to leave, he would be committed by EDP. Pt agreed to stay voluntarily.

## 2020-09-16 NOTE — Consult Note (Signed)
Telepsych Consultation   Reason for Consult:  Psychiatric evaluation for SI Referring Physician:  Dr. Ruthy DickZachowski Location of [facility]: [facility]  Patient Identification: Nathan RomansMickey L Koch MRN:  [id number] Principal Diagnosis: Methamphetamine use disorder, moderate ([name]) Diagnosis:  Principal Problem:   Methamphetamine use disorder, moderate ([name]) Active Problems:   MDD (major depressive disorder), recurrent episode, severe ([name])   Total Time spent with patient: 20 minutes  Subjective:   Nathan RomansMickey L Koch Koch a 30 y.o. male  who presented to [facility] via local police.  Pt reported to police that he had an argument with his wife, and he ingested a gram of meth.  Pt now endorses suicidal ideation without plan or intent, and he requests inpatient treatment   Nathan RomansMickey L Koch, 30 y.o., male patient seen via tele health by this provider, consulted with Dr. Lucianne MussKumar; and chart reviewed on 09/16/20.    HPI:    During evaluation Nathan Koch in sitting position in no acute distress. He makes good eye contact. He pleasant and engaged. He Koch alert, oriented x 4, calm and cooperative. He reports his mood as "good" with congruent affect. Denies depression. He does not appear to be responding to internal/external stimuli or delusional thoughts.  Patient denies suicidal/self-harm/homicidal ideation, psychosis, and paranoia.  Patient's judgment and insight are good at this time.  Patient Koch impulsive.  Patient answered question appropriately.  Patient reports he has a history of psychiatric admissions. States his latest admission was this month and [facility]  Reports last night he used methamphetamines and got into an argument with his wife. States he did not ingest the methamphetamines to kill himself, he ingested the drug to high.  Patient states it was the drugs talking, "I am not suicidal I do not want to hurt myself". States he  does not remember what he said when he was presented to the [facility].  Patient contracts for safety.  States he has no immediate safety concerns returning home.  Reports he does not have access to any weapons/firearms.  Reports he Koch taking his medications as prescribed.  He follows up with [name] at [facility].  Educated on suicide prevention.   The suicide prevention education provided includes the following:  Suicide risk factors  Suicide prevention and interventions  National Suicide Hotline telephone number  [facility] assessment telephone number  [facility] [address]  County and/or Residential Mobile Crisis Unit telephone number   Remove weapons (e.g., guns, rifles, knives), all items previously/currently identified as safety concern.  Denies access to firearms Remove drugs/medications (over the counter, prescriptions, illicit drugs), all items previously/currently identified as a safety concern.  Past Psychiatric History: anxiety, Bipolar 1, Depression, Methamphetamine Abuse, PTSD  Risk to Self:  pt denies Risk to Others:  pt denies Prior Inpatient Therapy:  yes Prior Outpatient Therapy:  yes  Past Medical History:  Past Medical History:  Diagnosis Date  . Anxiety   . Anxiety disorder   . Asthma   . Bipolar 1 disorder ([name])   . Depression   . Methamphetamine abuse ([name])   . PTSD (post-traumatic stress disorder)     Past Surgical History:  Procedure Laterality Date  . CLOSED REDUCTION MANDIBLE N/A 07/02/2018   Procedure: CLOSED REDUCTION MANDIBULAR;  Surgeon: Vernie MurdersJuengel, Paul, MD;  Location: [facility] ORS;  Service: ENT;  Laterality: N/A;   Family History:  Family History  Problem  Relation Age of Onset  . Asthma Mother   . Cancer Mother        breast cancer  . Ulcers Mother   . Cancer Father        esophogeal cancer  . Hypertension Father   . Asthma Brother    Family Psychiatric  History: unkown Social History:   Social History   Substance and Sexual Activity  Alcohol Use Not Currently  . Alcohol/week: 7.0 standard drinks  . Types: 7 Standard drinks or equivalent per week   Comment: previously heavy drinker 2016. most recent 4 beer/ day drinker and none since 01-11-2020     Social History   Substance and Sexual Activity  Drug Use Yes  . Types: Methamphetamines   Comment: last used 09/15/20    Social History   Socioeconomic History  . Marital status: Single    Spouse name: Not on file  . Number of children: Not on file  . Years of education: Not on file  . Highest education level: Not on file  Occupational History  . Not on file  Tobacco Use  . Smoking status: Current Every Day Smoker    Packs/day: 0.50    Years: 8.00    Pack years: 4.00    Types: Cigarettes  . Smokeless tobacco: Never Used  Vaping Use  . Vaping Use: Every day  . Substances: Nicotine, Flavoring  Substance and Sexual Activity  . Alcohol use: Not Currently    Alcohol/week: 7.0 standard drinks    Types: 7 Standard drinks or equivalent per week    Comment: previously heavy drinker 2016. most recent 4 beer/ day drinker and none since 01-11-2020  . Drug use: Yes    Types: Methamphetamines    Comment: last used 09/15/20  . Sexual activity: Not on file  Other Topics Concern  . Not on file  Social History Narrative  . Not on file   Social Determinants of Health   Financial Resource Strain: Not on file  Food Insecurity: Not on file  Transportation Needs: Not on file  Physical Activity: Not on file  Stress: Not on file  Social Connections: Not on file   Additional Social History:    Allergies:  No Known Allergies  Labs:  Results for orders placed or performed during the hospital encounter of 09/15/20 (from the past 48 hour(s))  Rapid urine drug screen (hospital performed)     Status: Abnormal   Collection Time: 09/15/20  4:59 PM  Result Value Ref Range   Opiates NONE DETECTED NONE DETECTED   Cocaine  NONE DETECTED NONE DETECTED   Benzodiazepines NONE DETECTED NONE DETECTED   Amphetamines POSITIVE (A) NONE DETECTED   Tetrahydrocannabinol NONE DETECTED NONE DETECTED   Barbiturates NONE DETECTED NONE DETECTED    Comment: (NOTE) DRUG SCREEN FOR MEDICAL PURPOSES ONLY.  IF CONFIRMATION Koch NEEDED FOR ANY PURPOSE, NOTIFY LAB WITHIN 5 DAYS.  LOWEST DETECTABLE LIMITS FOR URINE DRUG SCREEN Drug Class                     Cutoff (ng/mL) Amphetamine and metabolites    1000 Barbiturate and metabolites    200 Benzodiazepine                 200 Tricyclics and metabolites     300 Opiates and metabolites        300 Cocaine and metabolites        300 THC  50 Performed at [facility], [address], [place], [place] [address]   CBC with Differential/Platelet     Status: Abnormal   Collection Time: 09/15/20  5:19 PM  Result Value Ref Range   WBC 12.8 (H) 4.0 - 10.5 K/uL   RBC 5.34 4.22 - 5.81 MIL/uL   Hemoglobin 15.2 13.0 - 17.0 g/dL   HCT 47.8 29.5 - 62.1 %   MCV 85.8 80.0 - 100.0 fL   MCH 28.5 26.0 - 34.0 pg   MCHC 33.2 30.0 - 36.0 g/dL   RDW 30.8 65.7 - 84.6 %   Platelets 432 (H) 150 - 400 K/uL   nRBC 0.0 0.0 - 0.2 %   Neutrophils Relative % 80 %   Neutro Abs 10.2 (H) 1.7 - 7.7 K/uL   Lymphocytes Relative 12 %   Lymphs Abs 1.6 0.7 - 4.0 K/uL   Monocytes Relative 8 %   Monocytes Absolute 1.0 0.1 - 1.0 K/uL   Eosinophils Relative 0 %   Eosinophils Absolute 0.0 0.0 - 0.5 K/uL   Basophils Relative 0 %   Basophils Absolute 0.0 0.0 - 0.1 K/uL   Immature Granulocytes 0 %   Abs Immature Granulocytes 0.05 0.00 - 0.07 K/uL    Comment: Performed at [facility], [address], [place], [place] [address]  Comprehensive metabolic panel     Status: Abnormal   Collection Time: 09/15/20  5:19 PM  Result Value Ref Range   Sodium 142 135 - 145 mmol/L   Potassium 4.1 3.5 - 5.1 mmol/L   Chloride 106 98 - 111 mmol/L   CO2 26 22 - 32 mmol/L   Glucose, Bld 123 (H) 70 - 99  mg/dL    Comment: Glucose reference range applies only to samples taken after fasting for at least 8 hours.   BUN 13 6 - 20 mg/dL   Creatinine, Ser 2.84 0.61 - 1.24 mg/dL   Calcium 9.9 8.9 - 13.2 mg/dL   Total Protein 8.8 (H) 6.5 - 8.1 g/dL   Albumin 4.9 3.5 - 5.0 g/dL   AST 37 15 - 41 U/L   ALT 28 0 - 44 U/L   Alkaline Phosphatase 72 38 - 126 U/L   Total Bilirubin 1.1 0.3 - 1.2 mg/dL   GFR, Estimated >44 >01 mL/min    Comment: (NOTE) Calculated using the CKD-EPI Creatinine Equation (2021)    Anion gap 10 5 - 15    Comment: Performed at [facility], [address] [place], [place] [address]  Ethanol     Status: None   Collection Time: 09/15/20  5:19 PM  Result Value Ref Range   Alcohol, Ethyl (B) <10 <10 mg/dL    Comment: (NOTE) Lowest detectable limit for serum alcohol Koch 10 mg/dL.  For medical purposes only. Performed at [facility], [address] [place], [place] [address]     Medications:  No current facility-administered medications for this encounter.   Current Outpatient Medications  Medication Sig Dispense Refill  . gabapentin (NEURONTIN) 100 MG capsule Take 2 capsules (200 mg total) by mouth 3 (three) times daily. For agitation 180 capsule 0  . QUEtiapine (SEROQUEL) 100 MG tablet Take 1 tablet (100 mg total) by mouth at bedtime. For mood control 30 tablet 0  . sertraline (ZOLOFT) 50 MG tablet Take 1 tablet (50 mg total) by mouth at bedtime. For depression 30 tablet 0    Musculoskeletal: Strength & Muscle Tone: within normal limits Gait & Station: normal Patient leans: N/A  Psychiatric Specialty Exam: Physical Exam Vitals reviewed.  HENT:     Head: Normocephalic.     Right Ear: Tympanic membrane normal.     Left Ear: Tympanic membrane normal.     Nose: Nose normal.     Mouth/Throat:     Pharynx: Oropharynx Koch clear.  Eyes:     Conjunctiva/sclera: Conjunctivae normal.  Cardiovascular:     Rate and Rhythm: Normal rate.  Pulmonary:     Effort:  Pulmonary effort Koch normal. No respiratory distress.  Musculoskeletal:        General: Normal range of motion.     Cervical back: Normal range of motion.  Skin:    Capillary Refill: Capillary refill takes less than 2 seconds.     Coloration: Skin Koch not jaundiced or pale.  Neurological:     Mental Status: He Koch alert and oriented to person, place, and time.  Psychiatric:        Attention and Perception: Attention normal.        Mood and Affect: Mood Koch depressed.        Speech: Speech normal.        Behavior: Behavior normal. Behavior Koch cooperative.        Thought Content: Thought content normal. Thought content does not include homicidal or suicidal ideation. Thought content does not include homicidal or suicidal plan.        Cognition and Memory: Cognition normal.        Judgment: Judgment Koch impulsive.     Review of Systems  Constitutional: Negative.   HENT: Negative.   Eyes: Negative.   Respiratory: Negative.   Cardiovascular: Negative.   Gastrointestinal: Negative.   Endocrine: Negative.   Genitourinary: Negative.   Musculoskeletal: Negative.   Skin: Negative.   Allergic/Immunologic: Negative.   Neurological: Negative.   Hematological: Negative.   Psychiatric/Behavioral: Negative.  Negative for hallucinations and suicidal ideas.    Blood pressure 122/74, pulse 79, temperature 98 F (36.7 C), temperature source Oral, resp. rate 19, height 5\' 9"  (1.753 m), weight 95.3 kg, SpO2 100 %.Body mass index Koch 31.01 kg/m.  General Appearance: Fairly Groomed  Eye Contact:  Good  Speech:  Clear and Coherent and Normal Rate  Volume:  Normal  Mood:  Depressed  Affect:  Congruent and Depressed  Thought Process:  Coherent  Orientation:  Full (Time, Place, and Person)  Thought Content:  Logical  Suicidal Thoughts:  No  Homicidal Thoughts:  No  Memory:  Immediate;   Good Recent;   Good Remote;   Good  Judgement:  Fair  Insight:  Fair  Psychomotor Activity:  Normal   Concentration:  Concentration: Good and Attention Span: Good  Recall:  Good  Fund of Knowledge:  Good  Language:  Good  Akathisia:  No  Handed:  Right  AIMS (if indicated):     Assets:  Communication Skills Desire for Improvement Financial Resources/Insurance Housing Intimacy Leisure Time Physical Health Resilience Social Support Transportation Vocational/Educational  ADL's:  Intact  Cognition:  WNL  Sleep:   7 hours     Treatment Plan Summary: Follow up [facility], has appointment at 0800 09/17/2020 for medication management and out patient therapy States he will discuss out patient substance abuse resources with his provider at  [facility].   Disposition: No evidence of imminent risk to self or others at present.   Patient does not meet criteria for psychiatric inpatient admission. Supportive therapy provided about ongoing stressors. Discussed crisis plan, support from social network,  calling 911, coming to the Emergency Department, and calling Suicide Hotline.  This service was provided via telemedicine using a 2-way, interactive audio and video technology.  Names of all persons participating in this telemedicine service and their role in this encounter. Name: Haze Justin  Role: Patient   Name: Vernard Gambles  Role: NP  Name:  Role:   Name:  Role:   EDP Dr. Deretha Emory notified.   Ardis Hughs, NP 09/16/2020 8:46 AM

## 2020-09-16 NOTE — Discharge Instructions (Signed)
Follow-up as per behavioral health.  They have arranged follow-up with [facility] for you.

## 2020-09-16 NOTE — ED Notes (Signed)
Pt on TTS  

## 2020-09-18 ENCOUNTER — Other Ambulatory Visit: Payer: Self-pay

## 2020-09-18 ENCOUNTER — Ambulatory Visit (HOSPITAL_COMMUNITY)
Admission: EM | Admit: 2020-09-18 | Discharge: 2020-09-18 | Disposition: A | Discharge status: Home / Self Care | Payer: No Payment, Other | Attending: Psychiatry | Admitting: Psychiatry

## 2020-09-18 DIAGNOSIS — Z79899 Other long term (current) drug therapy: Secondary | ICD-10-CM | POA: Insufficient documentation

## 2020-09-18 DIAGNOSIS — F1510 Other stimulant abuse, uncomplicated: Secondary | ICD-10-CM | POA: Insufficient documentation

## 2020-09-18 DIAGNOSIS — Z9152 Personal history of nonsuicidal self-harm: Secondary | ICD-10-CM | POA: Insufficient documentation

## 2020-09-18 DIAGNOSIS — F329 Major depressive disorder, single episode, unspecified: Secondary | ICD-10-CM | POA: Insufficient documentation

## 2020-09-18 DIAGNOSIS — F1520 Other stimulant dependence, uncomplicated: Secondary | ICD-10-CM

## 2020-09-18 NOTE — BH Assessment (Addendum)
Triage note- ROUTINE- Pt reports he is "not sure" why he is here. Pt states he needs help in finding some kind of rehab. Denies SI, HI and AVH.

## 2020-09-18 NOTE — ED Notes (Signed)
Pt discharged in no acute distress. Verbalized understanding of discharge instructions reviewed on AVS. Safety maintained. °

## 2020-09-18 NOTE — ED Provider Notes (Signed)
Behavioral Health Urgent Care Medical Screening Exam  Patient Name: Nathan Koch MRN: [id number] Date of Evaluation: 09/18/20 Chief Complaint:   Diagnosis:  Final diagnoses:  Methamphetamine use disorder, moderate ([name])    History of Present illness: Nathan Koch is a 30 y.o. male.  Patient presents voluntarily to [facility] behavioral health for walk-in assessment.  He states "I went to [facility] in [place] today, I am looking for rehab for meth use but  I just left there about 2 or 3 weeks ago and they say it has not been long enough for me to return."  Patient reports current stressors include history of methamphetamine use.  He reports chronic meth use x3 years.  Last use of methamphetamine approximately 5 days ago.  He reports he is seeking rehab program lasting 6 months to 1 year. Upcoming court date scheduled for 11/03/2020.  Nathan Koch has been diagnosed with major depressive disorder, amphetamine induced depressive disorder and methamphetamine use disorder in the past.  He reports he is followed outpatient by Dr. Geanie Cooley at [facility] in [place].  He reports compliance with current medications including Seroquel 100 mg nightly.  Patient is assessed by nurse practitioner.  He is alert and oriented, answers appropriately.  He is pleasant and cooperative during assessment.  He denies suicidal and homicidal ideations.  He contracts verbally for safety with this Clinical research associate.  He denies any history of suicide attempts.  He endorses history of self-harm by cutting, last episode of cutting 1 month ago.  He denies auditory and visual hallucinations.  There is no evidence of delusional thought content and he denies symptoms of paranoia.  He resides in [place] at the days [employer] for approximately 2 weeks.  He reports his wife and children live in [place] but he is not permitted to stay in the home that is owned by his in-laws.  He denies access to weapons.  He is employed at [employer]  in [place].  He endorses average sleep and appetite.  He denies substance use aside from methamphetamine.  Last use of methamphetamine 5 days ago.  Patient offered support and encouragement.  He gives verbal consent to speak with a friend, Nathan Koch phone number [phone].  Spoke with patient's friend who denies concern for patient safety.  Psychiatric Specialty Exam  Presentation  General Appearance:Appropriate for Environment; Casual  Eye Contact:Good  Speech:Clear and Coherent; Normal Rate  Speech Volume:Normal  Handedness:Right   Mood and Affect  Mood:Euthymic  Affect:Appropriate; Congruent   Thought Process  Thought Processes:Coherent; Goal Directed  Descriptions of Associations:Intact  Orientation:Full (Time, Place and Person)  Thought Content:Logical; WDL  Diagnosis of Schizophrenia or Schizoaffective disorder in past: No   Hallucinations:None  Ideas of Reference:None  Suicidal Thoughts:No Without Intent; Without Plan; Without Access to Means Without Intent; Without Plan  Homicidal Thoughts:No   Sensorium  Memory:Immediate Good; Recent Good; Remote Good  Judgment:Good  Insight:Good   Executive Functions  Concentration:Good  Attention Span:Good  Recall:Good  Fund of Knowledge:Good  Language:Good   Psychomotor Activity  Psychomotor Activity:Normal   Assets  Assets:Communication Skills; Desire for Improvement; Housing; Health and safety inspector; Intimacy; Leisure Time; Physical Health; Resilience; Social Support; Talents/Skills; Transportation; Vocational/Educational   Sleep  Sleep:Good  Number of hours: 7   No data recorded  Physical Exam: Physical Exam Vitals and nursing note reviewed.  Constitutional:      Appearance: Normal appearance. He is well-developed.  HENT:     Head: Normocephalic and atraumatic.     Nose: Nose  normal.  Cardiovascular:     Rate and Rhythm: Normal rate.  Pulmonary:     Effort: Pulmonary  effort is normal.  Musculoskeletal:        General: Normal range of motion.     Cervical back: Normal range of motion.  Neurological:     Mental Status: He is alert and oriented to person, place, and time.  Psychiatric:        Attention and Perception: Attention and perception normal. He does not perceive auditory hallucinations.        Mood and Affect: Mood and affect normal.        Speech: Speech normal.        Behavior: Behavior normal. Behavior is cooperative.        Thought Content: Thought content normal.        Cognition and Memory: Cognition and memory normal.        Judgment: Judgment normal.    Review of Systems  Constitutional: Negative.   HENT: Negative.   Eyes: Negative.   Respiratory: Negative.   Cardiovascular: Negative.   Gastrointestinal: Negative.   Genitourinary: Negative.   Musculoskeletal: Negative.   Skin: Negative.   Neurological: Negative.   Endo/Heme/Allergies: Negative.   Psychiatric/Behavioral: Positive for substance abuse.   Blood pressure 126/84, pulse (!) 107, temperature 98.1 F (36.7 C), temperature source Oral, resp. rate 18, height 5\' 8"  (1.727 m), weight 222 lb (100.7 kg), SpO2 99 %. Body mass index is 33.75 kg/m.  Musculoskeletal: Strength & Muscle Tone: within normal limits Gait & Station: normal Patient leans: N/A   [facility] MSE Discharge Disposition for Follow up and Recommendations: Based on my evaluation the patient does not appear to have an emergency medical condition and can be discharged with resources and follow up care in outpatient services for Medication Management and Individual Therapy  Patient reviewed with Dr. . Follow-up with established outpatient psychiatry at [facility]. Follow-up with substance use treatment resources provided.    [facility], FNP 09/18/2020, 6:26 PM

## 2020-09-18 NOTE — Discharge Instructions (Addendum)
Patient is instructed prior to discharge to:  Take all medications as prescribed by his/her mental healthcare provider. Report any adverse effects and or reactions from the medicines to his/her outpatient provider promptly. Keep all scheduled appointments, to ensure that you are getting refills on time and to avoid any interruption in your medication.  If you are unable to keep an appointment call to reschedule.  Be sure to follow-up with resources and follow-up appointments provided.  Patient has been instructed & cautioned: To not engage in alcohol and or illegal drug use while on prescription medicines. In the event of worsening symptoms, patient is instructed to call the crisis hotline, 911 and or go to the nearest ED for appropriate evaluation and treatment of symptoms. To follow-up with his/her primary care provider for your other medical issues, concerns and or health care needs.    Substance Abuse Treatment Resources listed Below:  [facility] - Admissions are currently completed Monday through Friday at 8am; both appointments and walk-ins are accepted.  Any individual that is a [place] resident may present for a substance abuse screening and assessment for admission.  A person may be referred by numerous sources or self-refer.   Potential clients will be screened for medical necessity and appropriateness for the program.  Clients must meet criteria for high-intensity residential treatment services.  If clinically appropriate, a client will continue with the comprehensive clinical assessment and intake process, as well as enrollment in the [facility].  Address: [address] [place], [place] [address] Admin Hours: Mon-Fri 8AM to 5PM Center Hours: 24/7 Phone: [phone] Fax: [phone]  [facility] Address: [address], [place], [place] [address] Behavior[facility] Ur[facility] ([facility]) Hours: 24/7 Phone:  [phone] Fax: [phone]  [facility]): (offers outpatient therapy and intensive outpatient substance abuse therapy).  [address], [place], [place] [address] Phone: [phone]  [facility]: Offers FREE recovery skills classes, support groups, 1:1 Peer Support, and Compeer Classes. [address], [place], [place] [address] Phone: [phone] (Call to complete intake).   [facility] [address] [place], [place] [address] Phone: [phone] ext [phone] [facility] provides food, shelter and other programs and services to the homeless men of [place] through our men's program.  By offering safe shelter, three meals a day, clean clothing, Biblical counseling, financial planning, vocational training, GED/education and employment assistance, we've helped mend the shattered lives of many homeless men since opening in 1974.  We have approximately 267 beds available, with a max of 312 beds including mats for emergency situations and currently house an average of 270 men a night.  Prospective Client Check-In Information Photo ID Required (State/ Out of State/ DOC) - if photo ID is not available, clients are required to have a printout of a police/sheriff's criminal history report. Help out with chores around the Mission. No sex offender of any type (pending, charged, registered and/or any other sex related offenses) will be permitted to check in. Must be willing to abide by all rules, regulations, and policies established by the [facility]. The following will be provided - shelter, food, clothing, and biblical counseling. If you or someone you know is in need of assistance at our men's shelter in [place], [place], please call [phone] ext. [phone].  [facility]-will provide timely access to mental health services for children and adolescents (4-17) and adults    presenting in a mental health crisis. The program is designed for those who need urgent Behavioral Health or Substance Use treatment and are not experiencing a medical crisis that would typically require an emergency room visit.    [address] [place], [place] [address] Phone: [phone] [email or web address]  [facility]: Phone#: [phone]  The [facility] SA Intensive Outpatient Program (SAIOP) means structured individual and group addiction activities and services that are provided at an outpatient program designed to assist adult and adolescent consumers to begin recovery and learn skills for recovery maintenance. The [facility] SAIOP program is offered at least 3 hours a day, 3 days a week.SAIOP services shall include a structured program consisting of, but not limited to, the following services: Individual counseling and support; Group counseling and support; Family counseling, training or support; Biochemical assays to identify recent drug use (e.g., urine drug screens); Strategies for relapse prevention to include community and social support systems in treatment; Life skills; Crisis contingency planning; Disease Management; and Treatment support activities that have been adapted or specifically designed for persons with physical disabilities, or persons with co-occurring disorders of mental illness and substance abuse/dependence or mental retardation/developmental disability and substance abuse/dependence. Phone: [phone]  Address:   [facility] will also offer the following outpatient services: (Monday through Friday 8am-5pm)   Partial Hospitalization Program (PHP) Substance Abuse Intensive Outpatient Program (SA-IOP) Group Therapy Medication Management Peer Living Room We also provide (24/7):  Assessments: Our mental health clinician and providers will conduct a focused mental health evaluation, assessing for immediate  safety concerns and further mental health needs. Referral: Our team will provide resources and help connect to community based mental health treatment, when indicated, including psychotherapy, psychiatry, and other specialized behavioral health or substance use disorder services (for those not already in treatment). Transitional Care: Our team providers in person bridging and/or telephonic follow-up during the patient's transition to outpatient services.  [facility] 24-Hour Call Center: [phone] Behavioral Health Crisis Line: [phone]  

## 2020-10-06 ENCOUNTER — Other Ambulatory Visit: Payer: Self-pay

## 2020-10-06 ENCOUNTER — Encounter (HOSPITAL_COMMUNITY): Payer: Self-pay

## 2020-10-06 ENCOUNTER — Emergency Department (HOSPITAL_COMMUNITY): Payer: Self-pay

## 2020-10-06 ENCOUNTER — Emergency Department (HOSPITAL_COMMUNITY)
Admission: EM | Admit: 2020-10-06 | Discharge: 2020-10-07 | Disposition: A | Discharge status: Home / Self Care | Payer: Self-pay | Attending: Emergency Medicine | Admitting: Emergency Medicine

## 2020-10-06 DIAGNOSIS — F17210 Nicotine dependence, cigarettes, uncomplicated: Secondary | ICD-10-CM | POA: Insufficient documentation

## 2020-10-06 DIAGNOSIS — R45851 Suicidal ideations: Secondary | ICD-10-CM

## 2020-10-06 DIAGNOSIS — Y30XXXA Falling, jumping or pushed from a high place, undetermined intent, initial encounter: Secondary | ICD-10-CM | POA: Insufficient documentation

## 2020-10-06 DIAGNOSIS — S80212A Abrasion, left knee, initial encounter: Secondary | ICD-10-CM | POA: Insufficient documentation

## 2020-10-06 DIAGNOSIS — J45909 Unspecified asthma, uncomplicated: Secondary | ICD-10-CM | POA: Insufficient documentation

## 2020-10-06 DIAGNOSIS — F1520 Other stimulant dependence, uncomplicated: Secondary | ICD-10-CM | POA: Diagnosis present

## 2020-10-06 DIAGNOSIS — F332 Major depressive disorder, recurrent severe without psychotic features: Secondary | ICD-10-CM | POA: Diagnosis present

## 2020-10-06 DIAGNOSIS — Z20822 Contact with and (suspected) exposure to covid-19: Secondary | ICD-10-CM | POA: Insufficient documentation

## 2020-10-06 DIAGNOSIS — F12259 Cannabis dependence with psychotic disorder, unspecified: Secondary | ICD-10-CM | POA: Insufficient documentation

## 2020-10-06 LAB — ACETAMINOPHEN LEVEL: Acetaminophen (Tylenol), Serum: <10 ug/mL — ABNORMAL LOW (ref 10–30)

## 2020-10-06 LAB — CBC WITH DIFFERENTIAL/PLATELET
Abs Immature Granulocytes: 0.02 10*3/uL (ref 0.00–0.07)
Basophils Absolute: 0 10*3/uL (ref 0.0–0.1)
Basophils Relative: 1 %
Eosinophils Absolute: 0.3 10*3/uL (ref 0.0–0.5)
Eosinophils Relative: 4 %
HCT: 40.7 % (ref 39.0–52.0)
Hemoglobin: 13.4 g/dL (ref 13.0–17.0)
Immature Granulocytes: 0 %
Lymphocytes Relative: 21 %
Lymphs Abs: 1.3 10*3/uL (ref 0.7–4.0)
MCH: 28.6 pg (ref 26.0–34.0)
MCHC: 32.9 g/dL (ref 30.0–36.0)
MCV: 86.8 fL (ref 80.0–100.0)
Monocytes Absolute: 0.7 10*3/uL (ref 0.1–1.0)
Monocytes Relative: 10 %
Neutro Abs: 4.1 10*3/uL (ref 1.7–7.7)
Neutrophils Relative %: 64 %
Platelets: 335 10*3/uL (ref 150–400)
RBC: 4.69 MIL/uL (ref 4.22–5.81)
RDW: 13.8 % (ref 11.5–15.5)
WBC: 6.4 10*3/uL (ref 4.0–10.5)
nRBC: 0 % (ref 0.0–0.2)

## 2020-10-06 LAB — COMPREHENSIVE METABOLIC PANEL
ALT: 20 U/L (ref 0–44)
AST: 23 U/L (ref 15–41)
Albumin: 3.7 g/dL (ref 3.5–5.0)
Alkaline Phosphatase: 60 U/L (ref 38–126)
Anion gap: 7 (ref 5–15)
BUN: 7 mg/dL (ref 6–20)
CO2: 29 mmol/L (ref 22–32)
Calcium: 8.8 mg/dL — ABNORMAL LOW (ref 8.9–10.3)
Chloride: 99 mmol/L (ref 98–111)
Creatinine, Ser: 0.89 mg/dL (ref 0.61–1.24)
GFR, Estimated: >60 mL/min (ref >60)
Glucose, Bld: 73 mg/dL (ref 70–99)
Potassium: 3.5 mmol/L (ref 3.5–5.1)
Sodium: 135 mmol/L (ref 135–145)
Total Bilirubin: 1 mg/dL (ref 0.3–1.2)
Total Protein: 7.3 g/dL (ref 6.5–8.1)

## 2020-10-06 LAB — RAPID URINE DRUG SCREEN, HOSP PERFORMED
Amphetamines: POSITIVE — AB
Barbiturates: NOT DETECTED
Benzodiazepines: NOT DETECTED
Cocaine: NOT DETECTED
Opiates: NOT DETECTED
Tetrahydrocannabinol: NOT DETECTED

## 2020-10-06 LAB — ETHANOL: Alcohol, Ethyl (B): <10 mg/dL (ref <10)

## 2020-10-06 LAB — POC SARS CORONAVIRUS 2 AG -  ED: SARSCOV2ONAVIRUS 2 AG: NEGATIVE

## 2020-10-06 LAB — SALICYLATE LEVEL: Salicylate Lvl: <7 mg/dL — ABNORMAL LOW (ref 7.0–30.0)

## 2020-10-06 MED ORDER — GABAPENTIN 100 MG PO CAPS
200.0000 mg | ORAL_CAPSULE | Freq: Three times a day (TID) | ORAL | Status: DC
  Administered 2020-10-06 – 2020-10-07 (×4): 200 mg via ORAL
  Filled 2020-10-06 (×4): qty 2

## 2020-10-06 MED ORDER — QUETIAPINE FUMARATE 100 MG PO TABS
100.0000 mg | ORAL_TABLET | Freq: Every day | ORAL | Status: DC
  Administered 2020-10-06: 100 mg via ORAL
  Filled 2020-10-06: qty 1

## 2020-10-06 MED ORDER — SERTRALINE HCL 50 MG PO TABS
50.0000 mg | ORAL_TABLET | Freq: Every day | ORAL | Status: DC
  Administered 2020-10-06: 50 mg via ORAL
  Filled 2020-10-06: qty 1

## 2020-10-06 MED ORDER — HYDROXYZINE HCL 25 MG PO TABS: 25.0000 mg | ORAL_TABLET | Freq: Three times a day (TID) | ORAL | Status: DC | PRN
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
## 2020-10-06 NOTE — BH Assessment (Signed)
Ophelia Shoulder, PMHNP recommends patient be observed overnight for safety and stabilization.

## 2020-10-06 NOTE — ED Notes (Signed)
TTS in progress 

## 2020-10-06 NOTE — ED Triage Notes (Signed)
Pt presents to ED with SI. Pt states has been going on for approx 2 days and plans to jump in front of traffic. Pt states he has also been having trouble with his meth addiction. Pt c/o of abrasion to left knee.

## 2020-10-06 NOTE — ED Notes (Signed)
Pt states he last used meth yesterday. Pt then states "I would probably just try to overdose instead of jumping in front of a car."

## 2020-10-07 ENCOUNTER — Other Ambulatory Visit: Payer: Self-pay

## 2020-10-07 ENCOUNTER — Encounter (HOSPITAL_COMMUNITY): Payer: Self-pay | Admitting: Family Medicine

## 2020-10-07 ENCOUNTER — Inpatient Hospital Stay (HOSPITAL_COMMUNITY)
Admission: AD | Admit: 2020-10-07 | Discharge: 2020-10-14 | DRG: 885 | Disposition: A | Discharge status: Home / Self Care | Payer: No Typology Code available for payment source | Source: Intra-hospital | Attending: Emergency Medicine | Admitting: Emergency Medicine

## 2020-10-07 DIAGNOSIS — F332 Major depressive disorder, recurrent severe without psychotic features: Secondary | ICD-10-CM | POA: Diagnosis not present

## 2020-10-07 DIAGNOSIS — F4310 Post-traumatic stress disorder, unspecified: Secondary | ICD-10-CM | POA: Diagnosis present

## 2020-10-07 DIAGNOSIS — F319 Bipolar disorder, unspecified: Principal | ICD-10-CM | POA: Diagnosis present

## 2020-10-07 DIAGNOSIS — F17210 Nicotine dependence, cigarettes, uncomplicated: Secondary | ICD-10-CM | POA: Diagnosis present

## 2020-10-07 DIAGNOSIS — T43625A Adverse effect of amphetamines, initial encounter: Secondary | ICD-10-CM | POA: Diagnosis not present

## 2020-10-07 DIAGNOSIS — F1524 Other stimulant dependence with stimulant-induced mood disorder: Secondary | ICD-10-CM | POA: Diagnosis present

## 2020-10-07 DIAGNOSIS — Z79899 Other long term (current) drug therapy: Secondary | ICD-10-CM | POA: Diagnosis not present

## 2020-10-07 DIAGNOSIS — J45909 Unspecified asthma, uncomplicated: Secondary | ICD-10-CM | POA: Diagnosis present

## 2020-10-07 DIAGNOSIS — Z825 Family history of asthma and other chronic lower respiratory diseases: Secondary | ICD-10-CM | POA: Diagnosis not present

## 2020-10-07 DIAGNOSIS — F3289 Other specified depressive episodes: Secondary | ICD-10-CM | POA: Diagnosis not present

## 2020-10-07 DIAGNOSIS — Z5902 Unsheltered homelessness: Secondary | ICD-10-CM | POA: Diagnosis not present

## 2020-10-07 DIAGNOSIS — F1520 Other stimulant dependence, uncomplicated: Secondary | ICD-10-CM | POA: Diagnosis present

## 2020-10-07 DIAGNOSIS — R45851 Suicidal ideations: Secondary | ICD-10-CM | POA: Diagnosis present

## 2020-10-07 DIAGNOSIS — G4710 Hypersomnia, unspecified: Secondary | ICD-10-CM | POA: Diagnosis present

## 2020-10-07 DIAGNOSIS — F32A Depression, unspecified: Secondary | ICD-10-CM

## 2020-10-07 LAB — RESP PANEL BY RT-PCR (FLU A&B, COVID) ARPGX2
Influenza A by PCR: NEGATIVE
Influenza B by PCR: NEGATIVE
SARS Coronavirus 2 by RT PCR: NEGATIVE

## 2020-10-07 MED ORDER — SERTRALINE HCL 50 MG PO TABS
50.0000 mg | ORAL_TABLET | Freq: Every day | ORAL | Status: DC
  Administered 2020-10-07 – 2020-10-13 (×7): 50 mg via ORAL
  Filled 2020-10-07: qty 7
  Filled 2020-10-07 (×2): qty 1
  Filled 2020-10-07 (×3): qty 7
  Filled 2020-10-07 (×4): qty 1
  Filled 2020-10-07: qty 7

## 2020-10-07 MED ORDER — MAGNESIUM HYDROXIDE 400 MG/5ML PO SUSP: 30.0000 mL | Freq: Every day | ORAL | Status: DC | PRN

## 2020-10-07 MED ORDER — ALUM & MAG HYDROXIDE-SIMETH 200-200-20 MG/5ML PO SUSP: 30.0000 mL | ORAL | Status: DC | PRN

## 2020-10-07 MED ORDER — QUETIAPINE FUMARATE 100 MG PO TABS
100.0000 mg | ORAL_TABLET | Freq: Every day | ORAL | Status: DC
  Administered 2020-10-07 – 2020-10-13 (×7): 100 mg via ORAL
  Filled 2020-10-07: qty 1
  Filled 2020-10-07 (×2): qty 7
  Filled 2020-10-07: qty 1
  Filled 2020-10-07: qty 7
  Filled 2020-10-07: qty 1
  Filled 2020-10-07: qty 7
  Filled 2020-10-07: qty 1
  Filled 2020-10-07: qty 7
  Filled 2020-10-07: qty 1

## 2020-10-07 MED ORDER — GABAPENTIN 100 MG PO CAPS
200.0000 mg | ORAL_CAPSULE | Freq: Three times a day (TID) | ORAL | Status: DC
  Administered 2020-10-07 – 2020-10-10 (×8): 200 mg via ORAL
  Filled 2020-10-07 (×13): qty 2

## 2020-10-07 MED ORDER — ACETAMINOPHEN 325 MG PO TABS: 650.0000 mg | ORAL_TABLET | Freq: Four times a day (QID) | ORAL | Status: DC | PRN

## 2020-10-07 MED ORDER — HYDROXYZINE HCL 25 MG PO TABS
25.0000 mg | ORAL_TABLET | Freq: Three times a day (TID) | ORAL | Status: DC | PRN
  Administered 2020-10-09 – 2020-10-13 (×5): 25 mg via ORAL
  Filled 2020-10-07: qty 1
  Filled 2020-10-07: qty 10
  Filled 2020-10-07 (×4): qty 1

## 2020-10-07 NOTE — Progress Notes (Signed)
Psychoeducational Group Note  Date:  10/07/2020 Time:  2029  Group Topic/Focus:  Wrap-Up Group:   The focus of this group is to help patients review their daily goal of treatment and discuss progress on daily workbooks.  Participation Level: Did Not Attend  Participation Quality:  Not Applicable  Affect:  Not Applicable  Cognitive:  Not Applicable  Insight:  Not Applicable  Engagement in Group: Not Applicable  Additional Comments:  The patient did not attend group this evening.   Hazle Coca S 10/07/2020, 8:29 PM

## 2020-10-07 NOTE — ED Notes (Signed)
Report called to [facility] to  [place].

## 2020-10-07 NOTE — Progress Notes (Signed)
CSW spoke with assigned ED RN regarding requested information to send to [facility] for pending bed placement. CSW is continuing to follow up to secure dispositions.     Damita Dunnings, MSW, LCSW-A  2:12 PM 10/07/2020

## 2020-10-07 NOTE — Progress Notes (Signed)
Patient information has been sent to [facility] via secure chat to review for potential admission. Patient meets inpatient criteria per Dorena Bodo, NP.   Situation ongoing, CSW will continue to monitor progress.    Signed:  Damita Dunnings, MSW, LCSW-A  10/07/2020 1:28 PM

## 2020-10-07 NOTE — Plan of Care (Signed)
Nurse discussed alcohol and drug abuse with patient.

## 2020-10-07 NOTE — ED Notes (Signed)
Called [facility] for transport to [facility] 

## 2020-10-07 NOTE — ED Notes (Signed)
Pt was walked out of the ER and put into the [facility] vehicle to be transported to [facility].

## 2020-10-07 NOTE — Progress Notes (Signed)
Pt accepted to [id number]   Patient meets inpatient criteria per Dorena Bodo, NP.   Noel Gerold Mason Jim is the attending provider.    Call report to [phone]    Andrey Spearman, RN @ [facility] notified.     Pt scheduled  to arrive at [facility] at 1500   Damita Dunnings, MSW, LCSW-A  2:58 PM 10/07/2020

## 2020-10-07 NOTE — Progress Notes (Signed)
Patient is 30 yrs old, voluntary, has been pt at [facility] previously.  Works at [employer] in [place].  Lives with girlfriend in [place] with their 28 month old child.  SI thoughts and drug use.  Was off drugs for a month or more.  Girlfriend said "try it one time".  Patient had thoughts to jump into traffic or OD.  Abrasion on L knee.  Covid negative.  Last use methamphetamines yesterday.  UDS positive for amphetamines.  Knee x-ray negative.  Hearing loss R ear.  Has been using too much drugs.  Wants to keep his job.  Rated depression 8, anxiety 10, hopeless 7.  Nicotine 13 yrs.  THC last month, one gram.  Denied eroin and cocaine.  Alcohol since 30 yrs old, 6 pack daily.  Methamphetamines since 30 yrs old, last used two days ago, one gram. High fall risk.

## 2020-10-07 NOTE — Consult Note (Signed)
Telepsych Consultation   Reason for Consult: Psychiatric evaluation Referring Physician:  Dr. Criss Alvine, EDP  Location of Patient: [facility] Location of Provider: [facility]  Patient Identification: KING PINZON MRN:  [id number] Principal Diagnosis: Suicidal ideation Diagnosis:  Principal Problem:   Suicidal ideation Active Problems:   Methamphetamine use disorder, moderate ([name])   MDD (major depressive disorder), recurrent episode, severe ([name])   Total Time spent with patient: 20 minutes  Subjective:   AMIN FORNWALT is a 30 y.o. male patient per 10/06/20 note: "Patient is a 30 year old male presenting voluntarily to [facility] reporting suicidal ideation and substance use. Patient has been seen by [facility] service line multiple times in past month for the same. He states he and his wife having been arguing related to his use, resulting in him being on the street for the past few days. He reports feeling hopeless due to his current situation and reports SI with a plan to overdose. He denies HI. He states he last experienced AH 1 hour ago when a voice told him "you're a piece of crap." He states he does not hear voices when not under the influence. He reports daily meth use intermittently for the past 3 years. He is followed by [facility] in [place] for medication management and is prescribed Seroquel, which he states he is compliant with. He states in the past month he has been in residential substance use treatment at [facility] and inpatient at [facility]. He is requesting [facility] admission at this time".     HPI:  Boruch L. Huberty, 30 y.o. male, seen by this provider via tele psych.  Presented to the [facility] emergency department with suicidal ideation and methamphetamine use.  Alert and oriented to person place and situation, cooperative with interview, eye contact good, however, very drowsy throughout interview.  Patient lying calmly on the emergency room stretcher.  Speech slow, volume soft with  intact thought processes.  Describes his mood as depressed, and tired, with congruent affect.  Does not appear to be responding to internal or external stimuli.  Endorses suicidal ideation with intent and plans to overdose on methamphetamine, with access to methamphetamine.  Last methamphetamine use was 2 days ago.  Unable to contract for safety.  Denies homicidal ideation, denies auditory and visual hallucinations.  Lives with girlfriend, Kennyth Arnold.  Employed with [employer], as a grill cook.  Receives services through [employer] in [place].  Last inpatient stay with [facility]. May 17th for suicidal ideation.  Reports that he is compliant with his medications.  Past Psychiatric History: Depression, anxiety, PTSD, methamphetamine abuse.  Risk to Self:  yes Risk to Others:  no Prior Inpatient Therapy:  yes Prior Outpatient Therapy:  yes  Past Medical History:  Past Medical History:  Diagnosis Date   Anxiety    Anxiety disorder    Asthma    Bipolar 1 disorder ([name])    Depression    Methamphetamine abuse ([name])    PTSD (post-traumatic stress disorder)     Past Surgical History:  Procedure Laterality Date   CLOSED REDUCTION MANDIBLE N/A 07/02/2018   Procedure: CLOSED REDUCTION MANDIBULAR;  Surgeon: Vernie Murders, MD;  Location: [facility] ORS;  Service: ENT;  Laterality: N/A;   Family History:  Family History  Problem Relation Age of Onset   Asthma Mother    Cancer Mother        breast cancer   Ulcers Mother    Cancer Father        esophogeal  cancer   Hypertension Father    Asthma Brother    Family Psychiatric  History: unknown Social History:  Social History   Substance and Sexual Activity  Alcohol Use Not Currently   Alcohol/week: 7.0 standard drinks   Types: 7 Standard drinks or equivalent per week   Comment: previously heavy drinker 2016. most recent 4 beer/ day drinker and none since 01-11-2020     Social History   Substance and Sexual Activity  Drug Use Yes   Types:  Methamphetamines   Comment: last used 09/15/20    Social History   Socioeconomic History   Marital status: Single    Spouse name: Not on file   Number of children: Not on file   Years of education: Not on file   Highest education level: Not on file  Occupational History   Not on file  Tobacco Use   Smoking status: Every Day    Packs/day: 0.50    Years: 8.00    Pack years: 4.00    Types: Cigarettes   Smokeless tobacco: Never  Vaping Use   Vaping Use: Every day   Substances: Nicotine, Flavoring  Substance and Sexual Activity   Alcohol use: Not Currently    Alcohol/week: 7.0 standard drinks    Types: 7 Standard drinks or equivalent per week    Comment: previously heavy drinker 2016. most recent 4 beer/ day drinker and none since 01-11-2020   Drug use: Yes    Types: Methamphetamines    Comment: last used 09/15/20   Sexual activity: Not on file  Other Topics Concern   Not on file  Social History Narrative   Not on file   Social Determinants of Health   Financial Resource Strain: Not on file  Food Insecurity: Not on file  Transportation Needs: Not on file  Physical Activity: Not on file  Stress: Not on file  Social Connections: Not on file   Additional Social History:    Allergies:  No Known Allergies  Labs:  Results for orders placed or performed during the hospital encounter of 10/06/20 (from the past 48 hour(s))  Comprehensive metabolic panel     Status: Abnormal   Collection Time: 10/06/20  9:59 AM  Result Value Ref Range   Sodium 135 135 - 145 mmol/L   Potassium 3.5 3.5 - 5.1 mmol/L   Chloride 99 98 - 111 mmol/L   CO2 29 22 - 32 mmol/L   Glucose, Bld 73 70 - 99 mg/dL    Comment: Glucose reference range applies only to samples taken after fasting for at least 8 hours.   BUN 7 6 - 20 mg/dL   Creatinine, Ser 1.610.89 0.61 - 1.24 mg/dL   Calcium 8.8 (L) 8.9 - 10.3 mg/dL   Total Protein 7.3 6.5 - 8.1 g/dL   Albumin 3.7 3.5 - 5.0 g/dL   AST 23 15 - 41 U/L   ALT  20 0 - 44 U/L   Alkaline Phosphatase 60 38 - 126 U/L   Total Bilirubin 1.0 0.3 - 1.2 mg/dL   GFR, Estimated >09>60 >60>60 mL/min    Comment: (NOTE) Calculated using the CKD-EPI Creatinine Equation (2021)    Anion gap 7 5 - 15    Comment: Performed at [name], [address] [place], [place] [address]  Ethanol     Status: None   Collection Time: 10/06/20  9:59 AM  Result Value Ref Range   Alcohol, Ethyl (B) <10 <10 mg/dL    Comment: (NOTE)  Lowest detectable limit for serum alcohol is 10 mg/dL.  For medical purposes only. Performed at [facility], [address], [place], [place] [address]   CBC with Diff     Status: None   Collection Time: 10/06/20  9:59 AM  Result Value Ref Range   WBC 6.4 4.0 - 10.5 K/uL   RBC 4.69 4.22 - 5.81 MIL/uL   Hemoglobin 13.4 13.0 - 17.0 g/dL   HCT 49.7 02.6 - 37.8 %   MCV 86.8 80.0 - 100.0 fL   MCH 28.6 26.0 - 34.0 pg   MCHC 32.9 30.0 - 36.0 g/dL   RDW 58.8 50.2 - 77.4 %   Platelets 335 150 - 400 K/uL   nRBC 0.0 0.0 - 0.2 %   Neutrophils Relative % 64 %   Neutro Abs 4.1 1.7 - 7.7 K/uL   Lymphocytes Relative 21 %   Lymphs Abs 1.3 0.7 - 4.0 K/uL   Monocytes Relative 10 %   Monocytes Absolute 0.7 0.1 - 1.0 K/uL   Eosinophils Relative 4 %   Eosinophils Absolute 0.3 0.0 - 0.5 K/uL   Basophils Relative 1 %   Basophils Absolute 0.0 0.0 - 0.1 K/uL   Immature Granulocytes 0 %   Abs Immature Granulocytes 0.02 0.00 - 0.07 K/uL    Comment: Performed at [facility], [address], [place], [place] [address]  Salicylate level     Status: Abnormal   Collection Time: 10/06/20  9:59 AM  Result Value Ref Range   Salicylate Lvl <7.0 (L) 7.0 - 30.0 mg/dL    Comment: Performed at [facility], [address] [place], [place] [address]  Acetaminophen level     Status: Abnormal   Collection Time: 10/06/20  9:59 AM  Result Value Ref Range   Acetaminophen (Tylenol), Serum <10 (L) 10 - 30 ug/mL    Comment: (NOTE) Therapeutic concentrations vary significantly.  A range of 10-30 ug/mL  may be an effective concentration for many patients. However, some  are best treated at concentrations outside of this range. Acetaminophen concentrations >150 ug/mL at 4 hours after ingestion  and >50 ug/mL at 12 hours after ingestion are often associated with  toxic reactions.  Performed at [facility], [address], [place], [place] [address]   Urine rapid drug screen (hosp performed)     Status: Abnormal   Collection Time: 10/06/20 10:13 AM  Result Value Ref Range   Opiates NONE DETECTED NONE DETECTED   Cocaine NONE DETECTED NONE DETECTED   Benzodiazepines NONE DETECTED NONE DETECTED   Amphetamines POSITIVE (A) NONE DETECTED   Tetrahydrocannabinol NONE DETECTED NONE DETECTED   Barbiturates NONE DETECTED NONE DETECTED    Comment: (NOTE) DRUG SCREEN FOR MEDICAL PURPOSES ONLY.  IF CONFIRMATION IS NEEDED FOR ANY PURPOSE, NOTIFY LAB WITHIN 5 DAYS.  LOWEST DETECTABLE LIMITS FOR URINE DRUG SCREEN Drug Class                     Cutoff (ng/mL) Amphetamine and metabolites    1000 Barbiturate and metabolites    200 Benzodiazepine                 200 Tricyclics and metabolites     300 Opiates and metabolites        300 Cocaine and metabolites        300 THC  50 Performed at [facility], [address] [place], [place] [address]   POC SARS Coronavirus 2 Ag-ED - Nasal Swab     Status: None   Collection Time: 10/06/20 11:00 AM  Result Value Ref Range   SARSCOV2ONAVIRUS 2 AG NEGATIVE NEGATIVE    Comment: (NOTE) SARS-CoV-2 antigen NOT DETECTED.   Negative results are presumptive.  Negative results do not preclude SARS-CoV-2 infection and should not be used as the sole basis for treatment or other patient management decisions, including infection  control decisions, particularly in the presence of clinical signs and  symptoms consistent with COVID-19, or in those who have been in contact with the virus.  Negative results must be  combined with clinical observations, patient history, and epidemiological information. The expected result is Negative.  Fact Sheet for Patients: https://www.jennings-kim.com/  Fact Sheet for Healthcare Providers: https://alexander-rogers.biz/  This test is not yet approved or cleared by the [place] FDA and  has been authorized for detection and/or diagnosis of SARS-CoV-2 by FDA under an Emergency Use Authorization (EUA).  This EUA will remain in effect (meaning this test can be used) for the duration of  the COV ID-19 declaration under Section 564(b)(1) of the Act, 21 U.S.C. section 360bbb-3(b)(1), unless the authorization is terminated or revoked sooner.      Medications:  Current Facility-Administered Medications  Medication Dose Route Frequency Provider Last Rate Last Admin   gabapentin (NEURONTIN) capsule 200 mg  200 mg Oral TID Ophelia Shoulder E, NP   200 mg at 10/07/20 1022   hydrOXYzine (ATARAX/VISTARIL) tablet 25 mg  25 mg Oral TID PRN Chales Abrahams, NP       QUEtiapine (SEROQUEL) tablet 100 mg  100 mg Oral QHS Ophelia Shoulder E, NP   100 mg at 10/06/20 2132   sertraline (ZOLOFT) tablet 50 mg  50 mg Oral QHS Ophelia Shoulder E, NP   50 mg at 10/06/20 2132   Current Outpatient Medications  Medication Sig Dispense Refill   albuterol (VENTOLIN HFA) 108 (90 Base) MCG/ACT inhaler Inhale 2 puffs into the lungs every 4 (four) hours as needed for wheezing or shortness of breath.     gabapentin (NEURONTIN) 100 MG capsule Take 2 capsules (200 mg total) by mouth 3 (three) times daily. For agitation 180 capsule 0   QUEtiapine (SEROQUEL) 100 MG tablet Take 1 tablet (100 mg total) by mouth at bedtime. For mood control 30 tablet 0   sertraline (ZOLOFT) 50 MG tablet Take 1 tablet (50 mg total) by mouth at bedtime. For depression (Patient not taking: Reported on 10/06/2020) 30 tablet 0    Musculoskeletal: Strength & Muscle Tone:  unable to assess Gait & Station:   unable to assess, lying on ED stretcher Patient leans:  unable to assess          Psychiatric Specialty Exam:  Presentation  General Appearance: Appropriate for Environment  Eye Contact:Good  Speech:Slow  Speech Volume:Decreased  Handedness:Right   Mood and Affect  Mood:Depressed  Affect:Flat   Thought Process  Thought Processes:Coherent  Descriptions of Associations:Intact  Orientation:Full (Time, Place and Person)  Thought Content:Logical  History of Schizophrenia/Schizoaffective disorder:No  Duration of Psychotic Symptoms:Less than six months  Hallucinations:Hallucinations: None Ideas of Reference:None  Suicidal Thoughts:Suicidal Thoughts: Yes, Active SI Active Intent and/or Plan: With Intent; With Plan; With Means to Carry Out Homicidal Thoughts:Homicidal Thoughts: No  Sensorium  Memory:Immediate Fair; Recent Fair; Remote Fair  Judgment:Poor  Insight:Poor   Executive Functions  Concentration:Fair  Attention Span:Fair  Recall:Good  Fund of Knowledge:Fair  Language:Fair   Psychomotor Activity  Psychomotor Activity: Psychomotor Activity: Normal  Assets  Assets:Communication Skills; Desire for Improvement; Housing; Social Support; Vocational/Educational   Sleep  Sleep: Sleep: Good Number of Hours of Sleep: 7   Physical Exam: Physical Exam Vitals and nursing note reviewed.  Constitutional:      General: He is awake.  Cardiovascular:     Rate and Rhythm: Normal rate.  Pulmonary:     Effort: Pulmonary effort is normal.  Neurological:     Mental Status: He is oriented to person, place, and time and easily aroused.  Psychiatric:        Attention and Perception: Attention normal. He does not perceive auditory or visual hallucinations.        Mood and Affect: Mood is depressed. Affect is flat.        Speech: Speech is delayed.        Behavior: Behavior is cooperative.        Thought Content: Thought content is not paranoid  or delusional. Thought content includes suicidal ideation. Thought content does not include homicidal ideation. Thought content includes suicidal plan. Thought content does not include homicidal plan.        Cognition and Memory: Cognition normal.   Review of Systems  Respiratory:  Negative for shortness of breath.   Cardiovascular:  Negative for chest pain.  Neurological:  Negative for headaches.  Psychiatric/Behavioral:  Positive for depression, substance abuse and suicidal ideas. Negative for hallucinations and memory loss. The patient is not nervous/anxious and does not have insomnia.   Blood pressure 124/76, pulse 62, temperature 98.2 F (36.8 C), temperature source Oral, resp. rate 18, height 5\' 9"  (1.753 m), weight 97.1 kg, SpO2 96 %. Body mass index is 31.6 kg/m.  Treatment Plan Summary: Daily contact with patient to assess and evaluate symptoms and progress in treatment and Medication management. Staff to monitor for safety and stabilization.   Disposition: Recommend psychiatric Inpatient admission when medically cleared. Supportive therapy provided about ongoing stressors. EDP and RN, CSW notified of disposition.   This service was provided via telemedicine using a 2-way, interactive audio and video technology.  Names of all persons participating in this telemedicine service and their role in this encounter. Name: Daquan Crapps Role: patient  Name: Haze Justin Role: NP     Dorena Bodo, NP 10/07/2020 12:46 PM

## 2020-10-07 NOTE — ED Provider Notes (Signed)
Emergency Medicine Observation Re-evaluation Note  Nathan Koch is a 29 y.o. male, seen on rounds today.  Pt initially presented to the ED for complaints of V70.1 Currently, the patient is asleep.  Physical Exam  BP (!) 85/52 (BP Location: Right Arm)   Pulse (!) 56   Temp 98.2 F (36.8 C) (Oral)   Resp 16   Ht 5\' 9"  (1.753 m)   Wt 97.1 kg   SpO2 95%   BMI 31.60 kg/m  Physical Exam General: asleep Cardiac: asleep, not assessed Lungs: normal effort Psych: asleep, not assessed  ED Course / MDM  EKG:EKG Interpretation  Date/Time:  Sunday October 06 2020 14:16:50 EDT Ventricular Rate:  59 PR Interval:  164 QRS Duration: 100 QT Interval:  390 QTC Calculation: 386 R Axis:   91 Text Interpretation: Sinus bradycardia with sinus arrhythmia Rightward axis Nonspecific ST abnormality Abnormal ECG since last tracing no significant change Confirmed by 10-12-1980 [phone]) on 10/06/2020 6:22:55 PM  I have reviewed the labs performed to date as well as medications administered while in observation.  Recent changes in the last 24 hours include psychiatric meds placed by psychiatry.  Plan  Current plan is for reassessment by psychiatry. Patient is not under full IVC at this time.   12/06/2020, MD 10/07/20 [phone]

## 2020-10-07 NOTE — Plan of Care (Signed)
Nurse discussed coping skills and anxiety with patient.  

## 2020-10-08 DIAGNOSIS — T43625A Adverse effect of amphetamines, initial encounter: Secondary | ICD-10-CM

## 2020-10-08 DIAGNOSIS — F3289 Other specified depressive episodes: Secondary | ICD-10-CM

## 2020-10-08 LAB — LIPID PANEL
Cholesterol: 95 mg/dL (ref 0–200)
HDL: 26 mg/dL — ABNORMAL LOW (ref >40)
LDL Cholesterol: 49 mg/dL (ref 0–99)
Total CHOL/HDL Ratio: 3.7 RATIO
Triglycerides: 101 mg/dL (ref <150)
VLDL: 20 mg/dL (ref 0–40)

## 2020-10-08 LAB — TSH: TSH: 1.811 u[IU]/mL (ref 0.350–4.500)

## 2020-10-08 MED ORDER — ALBUTEROL SULFATE HFA 108 (90 BASE) MCG/ACT IN AERS: 2.0000 | INHALATION_SPRAY | RESPIRATORY_TRACT | Status: DC | PRN

## 2020-10-08 MED ORDER — NICOTINE 21 MG/24HR TD PT24
21.0000 mg | MEDICATED_PATCH | Freq: Every day | TRANSDERMAL | Status: DC
  Filled 2020-10-08 (×4): qty 1

## 2020-10-08 NOTE — Progress Notes (Signed)
   10/07/20 2226  Psych Admission Type (Psych Patients Only)  Admission Status Voluntary  Psychosocial Assessment  Patient Complaints Anxiety;Depression  Eye Contact Brief  Facial Expression Anxious  Affect Appropriate to circumstance  Speech Logical/coherent  Interaction Assertive  Motor Activity Slow  Appearance/Hygiene Unremarkable  Behavior Characteristics Cooperative;Calm  Mood Depressed;Anxious  Thought Process  Coherency WDL  Content WDL  Delusions None reported or observed  Perception WDL  Hallucination None reported or observed  Judgment WDL  Confusion WDL  Danger to Self  Current suicidal ideation? Denies  Danger to Others  Danger to Others None reported or observed

## 2020-10-08 NOTE — H&P (Signed)
Psychiatric Admission Assessment Adult  Patient Identification: Nathan Koch MRN:  [id number] Date of Evaluation:  10/08/2020 Chief Complaint:  Suicidal ideation [R45.851] Principal Diagnosis: Amphetamine-induced depressive disorder Diagnosis:  Principal Problem:   Amphetamine-induced depressive disorder Active Problems:   Methamphetamine use disorder, moderate ([place])  History of Present Illness: Medical record reviewed.  Patient's case discussed in detail with members of the treatment team.  I met with and evaluated the patient today on the unit.  Nathan Koch is a 30 year old male with past psychiatric diagnoses of methamphetamine use disorder, substance-induced mood disorder, depression, bipolar disorder and PTSD who was admitted from [facility] ED after he presented there voluntarily reporting suicidal ideation with a plan to jump in front of traffic.  Patient also reported struggling with meth addiction and requested treatment.  He reported most recent meth use was on 10/05/2020.  In the ED patient reported stressors at home with his wife which had resulted him living on the streets for the last few days and caused him to become more depressed.  UDS in the ED was positive for amphetamines.  BAL was negative.  He was observed overnight and transferred to [facility] on 10/07/2020 for further treatment and stabilization.  On evaluation with me today, the patient presents with constricted affect and he appears tired.  He states that he came to the hospital to seek help for his addiction and recently he had been prioritizing drug use over his work.  He reports daily use of methamphetamine (both IV use and smoking) of 1 g/day.  His last use was 2 days prior to the emergency department visit.  He denies use of other drugs or alcohol.  While using he reports that he slept less, ate less, lost weight and experienced auditory hallucinations and paranoia.  Since his last use he has been experiencing depressed mood,  fatigue, decreased energy, hypersomnia, increased appetite, irritability, sadness and suicidal ideation.  Patient reports that he continues to experience suicidal thoughts today but denies thoughts of harming himself in the hospital.  He is concerned that if discharged he would harm himself.  He denies AI, HI, PI, AH or VH.  Patient is interested in residential substance abuse treatment program.  Patient states that he was taking his psychiatric medicines approximately every other day since he was last discharged from the hospital.  Medications include Zoloft 50 mg daily, Seroquel 100 mg at bedtime, gabapentin 200 mg 3 times daily.   The patient reports a history of approximately 5 prior inpatient psychiatric admissions typically for suicidal ideation associated with substance use and depression.  He denies any history of suicide attempts.  The patient's most recent admission to [facility] from 09/08/2020 until 09/14/2020 for similar presentation.  He was treated with the above listed medications and referred to [facility] but did not follow-up.   Patient reports family psychiatric history of unknown mental health issues in his mother and brother.  He reports that his brother had a history of drug problems.  He states that his brother committed suicide.   Patient has asthma and is on an albuterol inhaler as needed.  He denies any history of seizure.  He denies any other medical issues.  Associated Signs/Symptoms: Depression Symptoms:  depressed mood, anhedonia, hypersomnia, fatigue, suicidal thoughts with specific plan, increased appetite, Duration of Depression Symptoms: Greater than two weeks  (Hypo) Manic Symptoms:   Denies Anxiety Symptoms:   Denies Psychotic Symptoms:   Denies PTSD Symptoms: Denies current Total Time spent with patient:  30 minutes  Past Psychiatric History: The patient reports a history of approximately 5 prior inpatient psychiatric admissions typically for suicidal ideation  associated with substance use and depression.  He denies any history of suicide attempts.  The patient's most recent admission to [facility] from 09/08/2020 until 09/14/2020 for similar presentation.  He was treated with Zoloft 50 mg daily, Seroquel 100 mg at bedtime, gabapentin 200 mg 3 times daily and referred to [facility] but did not follow-up.  Is the patient at risk to self? Yes.    Has the patient been a risk to self in the past 6 months? Yes.    Has the patient been a risk to self within the distant past? Yes.    Is the patient a risk to others? No.  Has the patient been a risk to others in the past 6 months? No.  Has the patient been a risk to others within the distant past? No.   Prior Inpatient Therapy:   Prior Outpatient Therapy:    Alcohol Screening: 1. How often do you have a drink containing alcohol?: 4 or more times a week 2. How many drinks containing alcohol do you have on a typical day when you are drinking?: 5 or 6 3. How often do you have six or more drinks on one occasion?: Daily or almost daily AUDIT-C Score: 10 4. How often during the last year have you found that you were not able to stop drinking once you had started?: Daily or almost daily 5. How often during the last year have you failed to do what was normally expected from you because of drinking?: Daily or almost daily 6. How often during the last year have you needed a first drink in the morning to get yourself going after a heavy drinking session?: Daily or almost daily 7. How often during the last year have you had a feeling of guilt of remorse after drinking?: Daily or almost daily 8. How often during the last year have you been unable to remember what happened the night before because you had been drinking?: Daily or almost daily 9. Have you or someone else been injured as a result of your drinking?: No 10. Has a relative or friend or a doctor or another health worker been concerned about your drinking or suggested you  cut down?: Yes, during the last year Alcohol Use Disorder Identification Test Final Score (AUDIT): 34 Alcohol Brief Interventions/Follow-up: Alcohol education/Brief advice Substance Abuse History in the last 12 months:  Yes.   Consequences of Substance Abuse: See HPI Previous Psychotropic Medications: Yes  Psychological Evaluations: Yes  Past Medical History:  Past Medical History:  Diagnosis Date   Anxiety    Anxiety disorder    Asthma    Bipolar 1 disorder ([place])    Depression    Methamphetamine abuse ([place])    PTSD (post-traumatic stress disorder)     Past Surgical History:  Procedure Laterality Date   CLOSED REDUCTION MANDIBLE N/A 07/02/2018   Procedure: CLOSED REDUCTION MANDIBULAR;  Surgeon: Margaretha Sheffield, MD;  Location: [facility] ORS;  Service: ENT;  Laterality: N/A;   Family History:  Family History  Problem Relation Age of Onset   Asthma Mother    Cancer Mother        breast cancer   Ulcers Mother    Cancer Father        esophogeal cancer   Hypertension Father    Asthma Brother    Family Psychiatric  History: Patient  reports family psychiatric history of unknown mental health issues in his mother and brother.  He reports that his brother had a history of drug problems.  He states that his brother committed suicide. Tobacco Screening:   Social History:  Social History   Substance and Sexual Activity  Alcohol Use Yes   Alcohol/week: 13.0 standard drinks   Types: 6 Cans of beer, 7 Standard drinks or equivalent per week   Comment: previously heavy drinker 2016. most recent 4 beer/ day drinker and none since 01-11-2020     Social History   Substance and Sexual Activity  Drug Use Yes   Frequency: 7.0 times per week   Types: Methamphetamines, Marijuana   Comment: last used 09/15/20    Additional Social History: Marital status: Long term relationship Long term relationship, how long?: 3 years What types of issues is patient dealing with in the relationship?: No issues  reported. Additional relationship information: No additional information Are you sexually active?: Yes What is your sexual orientation?: "Straight" Does patient have children?: Yes How many children?: 1 How is patient's relationship with their children?: Bonded well, see often. 18 month daughter                         Allergies:  No Known Allergies Lab Results:  Results for orders placed or performed during the hospital encounter of 10/07/20 (from the past 48 hour(s))  TSH     Status: None   Collection Time: 10/08/20  6:20 AM  Result Value Ref Range   TSH 1.811 0.350 - 4.500 uIU/mL    Comment: Performed by a 3rd Generation assay with a functional sensitivity of <=0.01 uIU/mL. Performed at [facility], [place] [address] [place], [place] [address]     Blood Alcohol level:  Lab Results  Component Value Date   [facility] <10 10/06/2020   ETH <10 50/12/3816    Metabolic Disorder Labs:  Lab Results  Component Value Date   HGBA1C 6.2 (H) 09/11/2020   MPG 131.24 09/11/2020   No results found for: PROLACTIN Lab Results  Component Value Date   CHOL 105 09/11/2020   TRIG 80 09/11/2020   HDL 29 (L) 09/11/2020   CHOLHDL 3.6 09/11/2020   VLDL 16 09/11/2020   LDLCALC 60 09/11/2020   LDLCALC 74 07/11/2014    Current Medications: Current Facility-Administered Medications  Medication Dose Route Frequency Provider Last Rate Last Admin   acetaminophen (TYLENOL) tablet 650 mg  650 mg Oral Q6H PRN Chalmers Guest, NP       albuterol (VENTOLIN HFA) 108 (90 Base) MCG/ACT inhaler 2 puff  2 puff Inhalation Q4H PRN Arthor Captain, MD       alum & mag hydroxide-simeth (MAALOX/MYLANTA) 200-200-20 MG/5ML suspension 30 mL  30 mL Oral Q4H PRN Chalmers Guest, NP       gabapentin (NEURONTIN) capsule 200 mg  200 mg Oral TID Chalmers Guest, NP   200 mg at 10/08/20 1258   hydrOXYzine (ATARAX/VISTARIL) tablet 25 mg  25 mg Oral TID PRN Chalmers Guest, NP       magnesium hydroxide  (MILK OF MAGNESIA) suspension 30 mL  30 mL Oral Daily PRN Chalmers Guest, NP       nicotine (NICODERM CQ - dosed in mg/24 hours) patch 21 mg  21 mg Transdermal Daily Arthor Captain, MD       QUEtiapine (SEROQUEL) tablet 100 mg  100 mg Oral QHS  Chalmers Guest, NP   100 mg at 10/07/20 2119   sertraline (ZOLOFT) tablet 50 mg  50 mg Oral QHS Chalmers Guest, NP   50 mg at 10/07/20 2119   PTA Medications: Medications Prior to Admission  Medication Sig Dispense Refill Last Dose   albuterol (VENTOLIN HFA) 108 (90 Base) MCG/ACT inhaler Inhale 2 puffs into the lungs every 4 (four) hours as needed for wheezing or shortness of breath.      gabapentin (NEURONTIN) 100 MG capsule Take 2 capsules (200 mg total) by mouth 3 (three) times daily. For agitation 180 capsule 0    QUEtiapine (SEROQUEL) 100 MG tablet Take 1 tablet (100 mg total) by mouth at bedtime. For mood control 30 tablet 0    sertraline (ZOLOFT) 50 MG tablet Take 1 tablet (50 mg total) by mouth at bedtime. For depression (Patient not taking: Reported on 10/06/2020) 30 tablet 0     Musculoskeletal: Strength & Muscle Tone: within normal limits Gait & Station: normal Patient leans: N/A            Psychiatric Specialty Exam:  Presentation  General Appearance: Casual  Eye Contact:Good  Speech:Clear and Coherent; Normal Rate  Speech Volume:Normal  Handedness:Right   Mood and Affect  Mood:Depressed  Affect:Congruent   Thought Process  Thought Processes:Coherent; Goal Directed  Duration of Psychotic Symptoms: Less than six months  Past Diagnosis of Schizophrenia or Psychoactive disorder: No  Descriptions of Associations:Intact  Orientation:Full (Time, Place and Person)  Thought Content:Logical  Hallucinations:Hallucinations: None  Ideas of Reference:None  Suicidal Thoughts:Suicidal Thoughts: Yes, Passive SI Active Intent and/or Plan: With Intent; With Plan; With Means to Carry Out SI Passive Intent and/or  Plan: Without Intent; Without Plan  Homicidal Thoughts:Homicidal Thoughts: No   Sensorium  Memory:Immediate Fair; Recent Fair; Remote Fair  Judgment:Fair  Insight:Shallow   Executive Functions  Concentration:Fair  Attention Span:Fair  [place]  Language:Good   Psychomotor Activity  Psychomotor Activity:Psychomotor Activity: Normal   Assets  Assets:Communication Skills; Desire for Improvement; Housing; Social Support; Vocational/Educational   Sleep  Sleep:Sleep: Fair Number of Hours of Sleep: 6    Physical Exam: Physical Exam Vitals and nursing note reviewed.  Constitutional:      General: He is not in acute distress. HENT:     Head: Normocephalic and atraumatic.  Pulmonary:     Effort: Pulmonary effort is normal.  Neurological:     General: No focal deficit present.     Mental Status: He is alert and oriented to person, place, and time.   ROS Constitutional:  Negative for chills, diaphoresis and fever. Respiratory:  Negative for cough and shortness of breath.   Cardiovascular:  Negative for chest pain and palpitations. Gastrointestinal:  Negative for constipation, diarrhea, nausea and vomiting. Musculoskeletal:  Negative for back pain. Neurological:  Negative for dizziness, tremors, seizures and headaches. Psychiatric/Behavioral:  Positive for depression, substance abuse and suicidal ideas. Negative for hallucinations. The patient does not have insomnia.    Blood pressure 109/67, pulse 64, temperature 97.9 F (36.6 C), temperature source Oral, resp. rate 18, SpO2 96 %. There is no height or weight on file to calculate BMI.  Treatment Plan Summary: Daily contact with patient to assess and evaluate symptoms and progress in treatment and Medication management  Observation Level/Precautions:  15 minute checks  Laboratory:  CBC Chemistry Profile HbAIC UDS Panel, TSH.   Available lab results reviewed.  CMP revealed calcium  of 8.8 and was otherwise WNL.  CBC and differential were WNL.  TSH was 1.811.  Urine drug screen was positive for amphetamines.   Psychotherapy:    Medications: See MAR  Consultations:    Discharge Concerns:    Estimated LOS: 3 to 5 days  Other:     Physician Treatment Plan for Primary Diagnosis: Amphetamine-induced depressive disorder Long Term Goal(s): Improvement in symptoms so as ready for discharge  Short Term Goals: Ability to identify changes in lifestyle to reduce recurrence of condition will improve, Ability to verbalize feelings will improve, Ability to disclose and discuss suicidal ideas, Ability to demonstrate self-control will improve, Ability to identify and develop effective coping behaviors will improve, Compliance with prescribed medications will improve, and Ability to identify triggers associated with substance abuse/mental health issues will improve  Physician Treatment Plan for Secondary Diagnosis: Principal Problem:   Amphetamine-induced depressive disorder Active Problems:   Methamphetamine use disorder, moderate ([place])  Long Term Goal(s): Improvement in symptoms so as ready for discharge  Short Term Goals: Ability to identify changes in lifestyle to reduce recurrence of condition will improve, Ability to verbalize feelings will improve, Ability to disclose and discuss suicidal ideas, Ability to demonstrate self-control will improve, Ability to identify and develop effective coping behaviors will improve, Compliance with prescribed medications will improve, and Ability to identify triggers associated with substance abuse/mental health issues will improve  I certify that inpatient services furnished can reasonably be expected to improve the patient's condition.    Arthor Captain, MD 6/14/20222:45 PM

## 2020-10-08 NOTE — Progress Notes (Signed)
Recreation Therapy Notes  Animal-Assisted Activity (AAA) Program Checklist/Progress Notes Patient Eligibility Criteria Checklist & Daily Group note for Rec Tx Intervention  Date: 6.14.22 Time: 1430 Location: [address] Dayroom   AAA/T Program Assumption of Risk Form signed by Patient/ or Parent Legal Guardian YES  Patient is free of allergies or severe asthma YES   Patient reports no fear of animals YES   Patient reports no history of cruelty to animals YES  Patient understands his/her participation is voluntary YES  Patient washes hands before animal contact YES  Patient washes hands after animal contact YES   Education: Hand Washing, Appropriate Animal Interaction   Education Outcome: Acknowledges understanding/In group clarification offered/Needs additional education.   Clinical Observations/Feedback: Pt did not attend activity.      [name], LRT/CTRS        Nathan Koch 10/08/2020 3:34 PM 

## 2020-10-08 NOTE — Plan of Care (Signed)
Nurse discussed coping skills with patient.  

## 2020-10-08 NOTE — Progress Notes (Signed)
Adult Psychoeducational Group Note  Date:  10/08/2020 Time:  4:27 PM  Group Topic/Focus:  Healthy Communication:   The focus of this group is to discuss communication, barriers to communication, as well as healthy ways to communicate with others.  Participation Level:  Active  Participation Quality:  Attentive  Affect:  Appropriate  Cognitive:  Alert and Appropriate  Insight: Appropriate and Good  Engagement in Group:  Engaged  Modes of Intervention:  Discussion  Additional Comments:  Pt is focused on making this his "last visit" to [facility] and stop using.   [employer] Dhiya Smits 10/08/2020, 4:27 PM

## 2020-10-08 NOTE — BHH Suicide Risk Assessment (Signed)
[facility] Admission Suicide Risk Assessment   Nursing information obtained from:  Patient Demographic factors:  Male, Low socioeconomic status Current Mental Status:  Suicidal ideation indicated by patient, Self-harm thoughts Loss Factors:  Financial problems / change in socioeconomic status Historical Factors:  Impulsivity Risk Reduction Factors:  Responsible for children under 29 years of age, Sense of responsibility to family, Employed, Living with another person, especially a relative  Total Time spent with patient: 30 minutes Principal Problem: Amphetamine-induced depressive disorder Diagnosis:  Principal Problem:   Amphetamine-induced depressive disorder Active Problems:   Methamphetamine use disorder, moderate ([place])  Subjective Data: Medical record reviewed.  Patient's case discussed in detail with members of the treatment team.  I met with and evaluated the patient today on the unit.  Nathan Koch is a 30 year old male with past psychiatric diagnoses of methamphetamine use disorder, substance-induced mood disorder, depression, bipolar disorder and PTSD who was admitted from [facility] ED after he presented there voluntarily reporting suicidal ideation with a plan to jump in front of traffic.  Patient also reported struggling with meth addiction and requested treatment.  He reported most recent meth use was on 10/05/2020.  In the ED patient reported stressors at home with his wife which had resulted him living on the streets for the last few days and caused him to become more depressed.  UDS in the ED was positive for amphetamines.  BAL was negative.  He was observed overnight and transferred to [facility] on 10/07/2020 for further treatment and stabilization.  On evaluation with me today, the patient presents with constricted affect and he appears tired.  He states that he came to the hospital to seek help for his addiction and recently he had been prioritizing drug use over his work.  He reports daily use of  methamphetamine (both IV use and smoking) of 1 g/day.  His last use was 2 days prior to the emergency department visit.  He denies use of other drugs or alcohol.  While using he reports that he slept less, ate less, lost weight and experienced auditory hallucinations and paranoia.  Since his last use he has been experiencing depressed mood, fatigue, decreased energy, hypersomnia, increased appetite, irritability, sadness and suicidal ideation.  Patient reports that he continues to experience suicidal thoughts today but denies thoughts of harming himself in the hospital.  He is concerned that if discharged he would harm himself.  He denies AI, HI, PI, AH or VH.  Patient is interested in residential substance abuse treatment program.  Patient states that he was taking his psychiatric medicines approximately every other day since he was last discharged from the hospital.  Medications include Zoloft 50 mg daily, Seroquel 100 mg at bedtime, gabapentin 200 mg 3 times daily.  The patient reports a history of approximately 5 prior inpatient psychiatric admissions typically for suicidal ideation associated with substance use and depression.  He denies any history of suicide attempts.  The patient's most recent admission to [facility] from 09/08/2020 until 09/14/2020 for similar presentation.  He was treated with the above listed medications and referred to [facility] but did not follow-up.  Patient reports family psychiatric history of unknown mental health issues in his mother and brother.  He reports that his brother had a history of drug problems.  He states that his brother committed suicide.  Patient has asthma and is on an albuterol inhaler as needed.  He denies any history of seizure.  He denies any other medical issues.  Continued Clinical  Symptoms:  Alcohol Use Disorder Identification Test Final Score (AUDIT): 34 The "Alcohol Use Disorders Identification Test", Guidelines for Use in Primary Care, Second Edition.   World Pharmacologist [facility]). Score between 0-7:  no or low risk or alcohol related problems. Score between 8-15:  moderate risk of alcohol related problems. Score between 16-19:  high risk of alcohol related problems. Score 20 or above:  warrants further diagnostic evaluation for alcohol dependence and treatment.   CLINICAL FACTORS:   Depression:   Anhedonia Alcohol/Substance Abuse/Dependencies Previous Psychiatric Diagnoses and Treatments   Musculoskeletal: Strength & Muscle Tone: within normal limits Gait & Station: normal Patient leans: N/A  Psychiatric Specialty Exam:  Presentation  General Appearance: Casual  Eye Contact:Good  Speech:Clear and Coherent; Normal Rate  Speech Volume:Normal  Handedness:Right   Mood and Affect  Mood:Depressed  Affect:Congruent   Thought Process  Thought Processes:Coherent; Goal Directed  Descriptions of Associations:Intact  Orientation:Full (Time, Place and Person)  Thought Content:Logical  History of Schizophrenia/Schizoaffective disorder:No  Duration of Psychotic Symptoms:Less than six months  Hallucinations:Hallucinations: None  Ideas of Reference:None  Suicidal Thoughts:Suicidal Thoughts: Yes, Passive SI Active Intent and/or Plan: With Intent; With Plan; With Means to Carry Out SI Passive Intent and/or Plan: Without Intent; Without Plan  Homicidal Thoughts:Homicidal Thoughts: No   Sensorium  Memory:Immediate Fair; Recent Fair; Remote Fair  Judgment:Fair  Insight:Shallow   Executive Functions  Concentration:Fair  Attention Span:Fair  [place]  Language:Good   Psychomotor Activity  Psychomotor Activity:Psychomotor Activity: Normal   Assets  Assets:Communication Skills; Desire for Improvement; Housing; Social Support; Vocational/Educational   Sleep  Sleep:Sleep: Fair Number of Hours of Sleep: 6    Physical Exam: Physical Exam Vitals and nursing note  reviewed.  Constitutional:      General: He is not in acute distress.    Appearance: He is not diaphoretic.  HENT:     Head: Normocephalic and atraumatic.  Pulmonary:     Effort: Pulmonary effort is normal.  Neurological:     General: No focal deficit present.     Mental Status: He is alert and oriented to person, place, and time.   Review of Systems  Constitutional:  Negative for chills, diaphoresis and fever.  Respiratory:  Negative for cough and shortness of breath.   Cardiovascular:  Negative for chest pain and palpitations.  Gastrointestinal:  Negative for constipation, diarrhea, nausea and vomiting.  Musculoskeletal:  Negative for back pain.  Neurological:  Negative for dizziness, tremors, seizures and headaches.  Psychiatric/Behavioral:  Positive for depression, substance abuse and suicidal ideas. Negative for hallucinations. The patient does not have insomnia.   Blood pressure 109/67, pulse 64, temperature 97.9 F (36.6 C), temperature source Oral, resp. rate 18, SpO2 96 %. There is no height or weight on file to calculate BMI.   COGNITIVE FEATURES THAT CONTRIBUTE TO RISK:  None    SUICIDE RISK:   Moderate:  Frequent suicidal ideation with limited intensity, and duration, some specificity in terms of plans, no associated intent, good self-control, limited dysphoria/symptomatology, some risk factors present, and identifiable protective factors, including available and accessible social support.  PLAN OF CARE: The patient has been admitted to the 300 inpatient unit at [facility] for safety, stabilization and treatment.  We will continue every 15-minute observation level.  Encouraged participation in group therapy and therapeutic milieu.  Available lab results reviewed.  CMP revealed calcium of 8.8 and was otherwise WNL.  CBC and differential were WNL.  TSH was 1.811.  Urine  drug screen was positive for amphetamines.  We will restart Zoloft 50 mg daily, Seroquel 100 mg at bedtime and  gabapentin 200 mg 3 times daily.  We will also continue albuterol inhaler as needed.  See MAR for additional details.  Patient requests referral to residential substance abuse treatment program upon discharge.  Estimated length of stay 3 to 5 days.  I certify that inpatient services furnished can reasonably be expected to improve the patient's condition.   Arthor Captain, MD 10/08/2020, 2:14 PM

## 2020-10-08 NOTE — Progress Notes (Signed)
Adult Psychoeducational Group Note  Date:  10/08/2020 Time:  2:16 PM  Group Topic/Focus:  Goals Group:   The focus of this group is to help patients establish daily goals to achieve during treatment and discuss how the patient can incorporate goal setting into their daily lives to aide in recovery.  Participation Level:  Did Not Attend   Margaret Pyle 10/08/2020, 2:16 PM
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
## 2020-10-09 LAB — HEMOGLOBIN A1C
Hgb A1c MFr Bld: 6 % — ABNORMAL HIGH (ref 4.8–5.6)
Mean Plasma Glucose: 126 mg/dL

## 2020-10-09 NOTE — Progress Notes (Signed)
Recreation Therapy Notes  Date: 6.15.22 Time: 0930 Location: 300 Hall Dayroom  Group Topic: Stress Management   Goal Area(s) Addresses:  Patient will actively participate in stress management techniques presented during session.  Patient will successfully identify benefit of practicing stress management post d/c.   Intervention: Guided exercise with ambient sound and script  Activity :Guided Imagery  LRT provided education, instruction, and demonstration on practice of visualization via guided imagery. Patient was asked to participate in the technique introduced during session. LRT also debriefed including topics of mindfulness, stress management and specific scenarios each patient could use these techniques. Patients were given suggestions of ways to access scripts post d/c and encouraged to explore Youtube and other apps available on smartphones, tablets, and computers.   Education:  Stress Management, Discharge Planning.   Education Outcome: Acknowledges education  Clinical Observations/Feedback: Patient did not attend group activity.     Caroll Rancher, LRT/CTRS         Caroll Rancher A 10/09/2020 11:41 AM

## 2020-10-09 NOTE — Progress Notes (Signed)
Psychoeducational Group Note  Date:  10/09/2020 Time:  2026  Group Topic/Focus:  Wrap-Up Group:   The focus of this group is to help patients review their daily goal of treatment and discuss progress on daily workbooks.  Participation Level: Did Not Attend  Participation Quality:  Not Applicable  Affect:  Not Applicable  Cognitive:  Not Applicable  Insight:  Not Applicable  Engagement in Group: Not Applicable  Additional Comments:  The patient did not attend group this evening.   Hazle Coca S 10/09/2020, 8:26 PM

## 2020-10-09 NOTE — Progress Notes (Signed)
Adult Psychoeducational Group Note  Date:  10/09/2020 Time:  10:50 AM  Group Topic/Focus:  Goals Group:   The focus of this group is to help patients establish daily goals to achieve during treatment and discuss how the patient can incorporate goal setting into their daily lives to aide in recovery.  Participation Level:  Did Not Attend   Margaret Pyle 10/09/2020, 10:50 AM

## 2020-10-09 NOTE — Plan of Care (Signed)
  Problem: Education: Goal: Knowledge of the prescribed therapeutic regimen will improve Outcome: Progressing   Problem: Education: Goal: Ability to state activities that reduce stress will improve Outcome: Not Progressing   Problem: Education: Goal: Utilization of techniques to improve thought processes will improve Outcome: Not Progressing

## 2020-10-09 NOTE — Progress Notes (Signed)
Pt continue to be isolative, stayed in the room most of the time, stated he had a good day. Pt took all his evening meds with no issues, denies SI/HI and contracted for safety, will continue to monitor.

## 2020-10-09 NOTE — Progress Notes (Signed)
[facility] MD Progress Note  10/09/2020 3:48 PM Nathan Koch  MRN:  [id number] Subjective:  "I am depressed and need help with my drug addiction"   Objective:  Nathan Koch is a 30 year old male with past psychiatric diagnoses of methamphetamine use disorder, substance-induced mood disorder, depression, bipolar disorder and PTSD who was admitted from [facility] after he presented there voluntarily reporting suicidal ideation with a plan to jump in front of traffic.  Patient also reported struggling with meth addiction and requested treatment.  He reported most recent meth use was on 10/05/2020.  In the ED patient reported stressors at home with his wife which had resulted him living on the streets for the last few days and caused him to become more depressed.  UDS in the ED was positive for amphetamines.  BAL was negative.  Evaluation on the unit: Patient was seen and evaluated, chart reviewed and case discussed with the treatment team. Patient stated he stills feels suicidal because he needs help for his drug addiction and depression. Patient reminded that his depression is likely in direct correlation to his drug use. He stated he has been taking his medications since he was discharged, however he has not filled any medications since discharge and reported on admission he is not taking his antidepressant. Patient was admitted to [facility] on 5/17 for a similar problem. At that time he had walked away form a half-way house he had been at for 8 months. He is not allowed to return there until he has been out for 8 months. He wants residential drug treatment and has been provided with phone numbers to call to check on beds. He will be able to return home when discharged and will likely have to call for beds daily as they are in short supply. Patient denies HI/AVH, paranoia and delusions. He reported good sleep, he slept 6.75 hours last night. He stated his appetite is good. He is not attending group therapy. He is staying  in bed most of the day and night. He was overheard arguing with his wife He has been encouraged to attend unit activities and actively make phone calls for substance abuse treatment beds. Patient was advised he will be discharged tomorrow. Vital signs are stable: BP 110/62, pulse 61, temp 97.9 afebrile. Patient is in no apparent distress.  Per staff note this afternoon, patient denied SI/HI/AVH and endorsed anxiety 10/10, he  received Vistaril 25 mg PRN.   Principal Problem: Amphetamine-induced depressive disorder Diagnosis: Principal Problem:   Amphetamine-induced depressive disorder Active Problems:   Methamphetamine use disorder, moderate ([name])  Total Time spent with patient:  25 minutes  Past Psychiatric History: See H&P  Past Medical History:  Past Medical History:  Diagnosis Date   Anxiety    Anxiety disorder    Asthma    Bipolar 1 disorder ([name])    Depression    Methamphetamine abuse ([name])    PTSD (post-traumatic stress disorder)     Past Surgical History:  Procedure Laterality Date   CLOSED REDUCTION MANDIBLE N/A 07/02/2018   Procedure: CLOSED REDUCTION MANDIBULAR;  Surgeon: Vernie Murders, MD;  Location: [facility] ORS;  Service: ENT;  Laterality: N/A;   Family History:  Family History  Problem Relation Age of Onset   Asthma Mother    Cancer Mother        breast cancer   Ulcers Mother    Cancer Father        esophogeal cancer   Hypertension Father  Asthma Brother    Family Psychiatric  History: See H&P Social History:  Social History   Substance and Sexual Activity  Alcohol Use Yes   Alcohol/week: 13.0 standard drinks   Types: 6 Cans of beer, 7 Standard drinks or equivalent per week   Comment: previously heavy drinker 2016. most recent 4 beer/ day drinker and none since 01-11-2020     Social History   Substance and Sexual Activity  Drug Use Yes   Frequency: 7.0 times per week   Types: Methamphetamines, Marijuana   Comment: last used 09/15/20    Social  History   Socioeconomic History   Marital status: Significant Other    Spouse name: Not on file   Number of children: 1   Years of education: 10   Highest education level: 10th grade  Occupational History   Not on file  Tobacco Use   Smoking status: Every Day    Packs/day: 0.50    Years: 8.00    Pack years: 4.00    Types: Cigarettes   Smokeless tobacco: Never  Vaping Use   Vaping Use: Every day   Substances: Nicotine, Flavoring  Substance and Sexual Activity   Alcohol use: Yes    Alcohol/week: 13.0 standard drinks    Types: 6 Cans of beer, 7 Standard drinks or equivalent per week    Comment: previously heavy drinker 2016. most recent 4 beer/ day drinker and none since 01-11-2020   Drug use: Yes    Frequency: 7.0 times per week    Types: Methamphetamines, Marijuana    Comment: last used 09/15/20   Sexual activity: Not on file  Other Topics Concern   Not on file  Social History Narrative   Not on file   Social Determinants of Health   Financial Resource Strain: Not on file  Food Insecurity: Not on file  Transportation Needs: Not on file  Physical Activity: Not on file  Stress: Not on file  Social Connections: Not on file   Additional Social History:   Sleep: Good  Appetite:  Good  Current Medications: Current Facility-Administered Medications  Medication Dose Route Frequency Provider Last Rate Last Admin   acetaminophen (TYLENOL) tablet 650 mg  650 mg Oral Q6H PRN Novella Oliveolby, Karen R, NP       albuterol (VENTOLIN HFA) 108 (90 Base) MCG/ACT inhaler 2 puff  2 puff Inhalation Q4H PRN Claudie ReveringJames, Martha L, MD       alum & mag hydroxide-simeth (MAALOX/MYLANTA) 200-200-20 MG/5ML suspension 30 mL  30 mL Oral Q4H PRN Novella Oliveolby, Karen R, NP       gabapentin (NEURONTIN) capsule 200 mg  200 mg Oral TID Novella Oliveolby, Karen R, NP   200 mg at 10/09/20 1352   hydrOXYzine (ATARAX/VISTARIL) tablet 25 mg  25 mg Oral TID PRN Novella Oliveolby, Karen R, NP   25 mg at 10/09/20 0944   magnesium hydroxide (MILK OF  MAGNESIA) suspension 30 mL  30 mL Oral Daily PRN Novella Oliveolby, Karen R, NP       nicotine (NICODERM CQ - dosed in mg/24 hours) patch 21 mg  21 mg Transdermal Daily Claudie ReveringJames, Martha L, MD       QUEtiapine (SEROQUEL) tablet 100 mg  100 mg Oral QHS Novella Oliveolby, Karen R, NP   100 mg at 10/08/20 2125   sertraline (ZOLOFT) tablet 50 mg  50 mg Oral QHS Novella Oliveolby, Karen R, NP   50 mg at 10/08/20 2125    Lab Results:  Results for orders placed or performed during  the hospital encounter of 10/07/20 (from the past 48 hour(s))  TSH     Status: None   Collection Time: 10/08/20  6:20 AM  Result Value Ref Range   TSH 1.811 0.350 - 4.500 uIU/mL    Comment: Performed by a 3rd Generation assay with a functional sensitivity of <=0.01 uIU/mL. Performed at [facility], [address], [place], [place] [address]   Lipid panel     Status: Abnormal   Collection Time: 10/08/20  6:35 PM  Result Value Ref Range   Cholesterol 95 0 - 200 mg/dL   Triglycerides 017 <510 mg/dL   HDL 26 (L) >25 mg/dL   Total CHOL/HDL Ratio 3.7 RATIO   VLDL 20 0 - 40 mg/dL   LDL Cholesterol 49 0 - 99 mg/dL    Comment:        Total Cholesterol/HDL:CHD Risk Coronary Heart Disease Risk Table                     Men   Women  1/2 Average Risk   3.4   3.3  Average Risk       5.0   4.4  2 X Average Risk   9.6   7.1  3 X Average Risk  23.4   11.0        Use the calculated Patient Ratio above and the CHD Risk Table to determine the patient's CHD Risk.        ATP III CLASSIFICATION (LDL):  <100     mg/dL   Optimal  852-778  mg/dL   Near or Above                    Optimal  130-159  mg/dL   Borderline  242-353  mg/dL   High  >614     mg/dL   Very High Performed at [facility], [address] [place], [place] [address]   Hemoglobin A1c     Status: Abnormal   Collection Time: 10/08/20  6:35 PM  Result Value Ref Range   Hgb A1c MFr Bld 6.0 (H) 4.8 - 5.6 %    Comment: (NOTE)         Prediabetes: 5.7 - 6.4          Diabetes: >6.4         Glycemic control for adults with diabetes: <7.0    Mean Plasma Glucose 126 mg/dL    Comment: (NOTE) Performed At: [facility] [place] [address] [place], [place] [id number] Jolene Schimke MD KD:[id number]     Blood Alcohol level:  Lab Results  Component Value Date   [facility] <10 10/06/2020   ETH <10 09/15/2020    Metabolic Disorder Labs: Lab Results  Component Value Date   HGBA1C 6.0 (H) 10/08/2020   MPG 126 10/08/2020   MPG 131.24 09/11/2020   No results found for: PROLACTIN Lab Results  Component Value Date   CHOL 95 10/08/2020   TRIG 101 10/08/2020   HDL 26 (L) 10/08/2020   CHOLHDL 3.7 10/08/2020   VLDL 20 10/08/2020   LDLCALC 49 10/08/2020   LDLCALC 60 09/11/2020    Physical Findings: AIMS: Facial and Oral Movements Muscles of Facial Expression: None, normal Lips and Perioral Area: None, normal Jaw: None, normal Tongue: None, normal,Extremity Movements Upper (arms, wrists, hands, fingers): None, normal Lower (legs, knees, ankles, toes): None, normal, Trunk Movements Neck, shoulders, hips: None, normal, Overall Severity Severity of abnormal movements (highest  score from questions above): None, normal Incapacitation due to abnormal movements: None, normal Patient's awareness of abnormal movements (rate only patient's report): No Awareness, Dental Status Current problems with teeth and/or dentures?: No Does patient usually wear dentures?: No  CIWA:    COWS:     Musculoskeletal: Strength & Muscle Tone: within normal limits Gait & Station: normal Patient leans: N/A  Psychiatric Specialty Exam:  Presentation  General Appearance: Casual  Eye Contact:Good  Speech:Clear and Coherent; Normal Rate  Speech Volume:Normal  Handedness:Right  Mood and Affect  Mood:Depressed  Affect:Congruent  Thought Process  Thought Processes:Coherent; Goal Directed  Descriptions of Associations:Intact  Orientation:Full (Time, Place and  Person)  Thought Content:Logical  History of Schizophrenia/Schizoaffective disorder:No  Duration of Psychotic Symptoms:Less than six months  Hallucinations:Hallucinations: None  Ideas of Reference:None  Suicidal Thoughts:Suicidal Thoughts: Yes, Passive SI Passive Intent and/or Plan: Without Intent; Without Plan  Homicidal Thoughts:Homicidal Thoughts: No  Sensorium  Memory:Immediate Fair; Recent Fair; Remote Fair  Judgment:Fair  Insight:Shallow  Executive Functions  Concentration:Fair  Attention Span:Fair  Recall:Fair  Fund of Knowledge:Fair  Language:Good  Psychomotor Activity  Psychomotor Activity:Psychomotor Activity: Normal  Assets  Assets:Communication Skills; Desire for Improvement; Housing; Social Support; Vocational/Educational  Sleep  Sleep:Sleep: Fair Number of Hours of Sleep: 6  Physical Exam: Physical Exam Vitals and nursing note reviewed.  HENT:     Head: Normocephalic.  Pulmonary:     Effort: Pulmonary effort is normal.  Musculoskeletal:        General: Normal range of motion.     Cervical back: Normal range of motion.  Neurological:     General: No focal deficit present.     Mental Status: He is alert and oriented to person, place, and time.   ROS Blood pressure (!) 113/52, pulse 74, temperature 97.9 F (36.6 C), resp. rate 20, SpO2 96 %. There is no height or weight on file to calculate BMI.  Treatment Plan Summary: Daily contact with patient to assess and evaluate symptoms and progress in treatment and Medication management  Depression: -Continue Zoloft 50 mg PO daily -Continue Seroquel 100 mg PO at bedtime   Anxiety/Methamphetamine withdrawal:  -Continue Gabapentin 200 mg PO TID -Continue Vistaril 25 mg PO TID PRN   Nicotine Dependence:  -Continue Nicoderm 21 mg patch daily  Continue as needed medications:  -Mylanta 30 mL PO every 4 hrs PRN for indigestion -Tylenol 650 mg PO every 6 hrs PRN for pain -MOM 30 mL PO daily  PRN for constipation -Albuterol inhaler 2 puffs every 4 hrs PRN for asthma  Laveda Abbe, NP 10/09/2020, 3:48 PM

## 2020-10-09 NOTE — Progress Notes (Signed)
D: Patient denies SI/HI/AVH. Pt. Rated anxiety 10/10 and depression 8/10.  A:  Patient took scheduled medicine.  Vistaril 25 mg given for anxiety.  Support and encouragement provided Routine safety checks conducted every 15 minutes. Patient  Informed to notify staff with any concerns.   R:  Safety maintained.

## 2020-10-09 NOTE — Progress Notes (Signed)
  D:  Pt presents with mild anxiety and elevated depression (8/10).  Pt in bed on assessment.  Pt states spoke with his family today.  Pt reports SI, and verbally contracts for safety.  Pt denies HI, and AVH.  A:  Labs/Vital being monitored; Medication education provided; Pt encouraged to communicate concerns.  R:  Pt remains safe on unit with q15 minute checks.  Will continue POC.      10/09/20 2148  Psych Admission Type (Psych Patients Only)  Admission Status Voluntary  Psychosocial Assessment  Patient Complaints Depression;Anxiety  Eye Contact Brief  Facial Expression Anxious  Affect Appropriate to circumstance  Speech Logical/coherent  Interaction Assertive  Motor Activity Slow  Appearance/Hygiene Unremarkable  Behavior Characteristics Anxious  Mood Depressed;Anxious  Thought Process  Coherency WDL  Content WDL  Delusions None reported or observed  Perception WDL  Hallucination None reported or observed  Judgment WDL  Confusion WDL  Danger to Self  Current suicidal ideation? Passive  Self-Injurious Behavior No self-injurious ideation or behavior indicators observed or expressed   Agreement Not to Harm Self Yes  Description of Agreement verbal contract for safety  Danger to Others  Danger to Others None reported or observed

## 2020-10-09 NOTE — Tx Team (Signed)
Interdisciplinary Treatment and Diagnostic Plan Update  10/09/2020 Time of Session: 9:00am  [name] MRN: [id number]  Principal Diagnosis: Amphetamine-induced depressive disorder  Secondary Diagnoses: Principal Problem:   Amphetamine-induced depressive disorder Active Problems:   Methamphetamine use disorder, moderate ([name])   Current Medications:  Current Facility-Administered Medications  Medication Dose Route Frequency Provider Last Rate Last Admin   acetaminophen (TYLENOL) tablet 650 mg  650 mg Oral Q6H PRN Novella Olive, NP       albuterol (VENTOLIN HFA) 108 (90 Base) MCG/ACT inhaler 2 puff  2 puff Inhalation Q4H PRN Claudie Revering, MD       alum & mag hydroxide-simeth (MAALOX/MYLANTA) 200-200-20 MG/5ML suspension 30 mL  30 mL Oral Q4H PRN Novella Olive, NP       gabapentin (NEURONTIN) capsule 200 mg  200 mg Oral TID Novella Olive, NP   200 mg at 10/09/20 0943   hydrOXYzine (ATARAX/VISTARIL) tablet 25 mg  25 mg Oral TID PRN Novella Olive, NP   25 mg at 10/09/20 0944   magnesium hydroxide (MILK OF MAGNESIA) suspension 30 mL  30 mL Oral Daily PRN Novella Olive, NP       nicotine (NICODERM CQ - dosed in mg/24 hours) patch 21 mg  21 mg Transdermal Daily Claudie Revering, MD       QUEtiapine (SEROQUEL) tablet 100 mg  100 mg Oral QHS Novella Olive, NP   100 mg at 10/08/20 2125   sertraline (ZOLOFT) tablet 50 mg  50 mg Oral QHS Novella Olive, NP   50 mg at 10/08/20 2125   PTA Medications: Medications Prior to Admission  Medication Sig Dispense Refill Last Dose   albuterol (VENTOLIN HFA) 108 (90 Base) MCG/ACT inhaler Inhale 2 puffs into the lungs every 4 (four) hours as needed for wheezing or shortness of breath.      gabapentin (NEURONTIN) 100 MG capsule Take 2 capsules (200 mg total) by mouth 3 (three) times daily. For agitation 180 capsule 0    QUEtiapine (SEROQUEL) 100 MG tablet Take 1 tablet (100 mg total) by mouth at bedtime. For mood control 30 tablet 0    sertraline  (ZOLOFT) 50 MG tablet Take 1 tablet (50 mg total) by mouth at bedtime. For depression (Patient not taking: Reported on 10/06/2020) 30 tablet 0     Patient Stressors:    Patient Strengths:    Treatment Modalities: Medication Management, Group therapy, Case management,  1 to 1 session with clinician, Psychoeducation, Recreational therapy.   Physician Treatment Plan for Primary Diagnosis: Amphetamine-induced depressive disorder Long Term Goal(s): Improvement in symptoms so as ready for discharge   Short Term Goals: Ability to identify changes in lifestyle to reduce recurrence of condition will improve Ability to verbalize feelings will improve Ability to disclose and discuss suicidal ideas Ability to demonstrate self-control will improve Ability to identify and develop effective coping behaviors will improve Compliance with prescribed medications will improve Ability to identify triggers associated with substance abuse/mental health issues will improve  Medication Management: Evaluate patient's response, side effects, and tolerance of medication regimen.  Therapeutic Interventions: 1 to 1 sessions, Unit Group sessions and Medication administration.  Evaluation of Outcomes: Progressing  Physician Treatment Plan for Secondary Diagnosis: Principal Problem:   Amphetamine-induced depressive disorder Active Problems:   Methamphetamine use disorder, moderate ([name])  Long Term Goal(s): Improvement in symptoms so as ready for discharge   Short Term Goals: Ability to identify changes in lifestyle to reduce recurrence  of condition will improve Ability to verbalize feelings will improve Ability to disclose and discuss suicidal ideas Ability to demonstrate self-control will improve Ability to identify and develop effective coping behaviors will improve Compliance with prescribed medications will improve Ability to identify triggers associated with substance abuse/mental health issues will  improve     Medication Management: Evaluate patient's response, side effects, and tolerance of medication regimen.  Therapeutic Interventions: 1 to 1 sessions, Unit Group sessions and Medication administration.  Evaluation of Outcomes: Progressing   RN Treatment Plan for Primary Diagnosis: Amphetamine-induced depressive disorder Long Term Goal(s): Knowledge of disease and therapeutic regimen to maintain health will improve  Short Term Goals: Ability to remain free from injury will improve, Ability to participate in decision making will improve, Ability to verbalize feelings will improve, Ability to disclose and discuss suicidal ideas, and Ability to identify and develop effective coping behaviors will improve  Medication Management: RN will administer medications as ordered by provider, will assess and evaluate patient's response and provide education to patient for prescribed medication. RN will report any adverse and/or side effects to prescribing provider.  Therapeutic Interventions: 1 on 1 counseling sessions, Psychoeducation, Medication administration, Evaluate responses to treatment, Monitor vital signs and CBGs as ordered, Perform/monitor CIWA, COWS, AIMS and Fall Risk screenings as ordered, Perform wound care treatments as ordered.  Evaluation of Outcomes: Progressing   LCSW Treatment Plan for Primary Diagnosis: Amphetamine-induced depressive disorder Long Term Goal(s): Safe transition to appropriate next level of care at discharge, Engage patient in therapeutic group addressing interpersonal concerns.  Short Term Goals: Engage patient in aftercare planning with referrals and resources, Increase social support, Facilitate acceptance of mental health diagnosis and concerns, Facilitate patient progression through stages of change regarding substance use diagnoses and concerns, Identify triggers associated with mental health/substance abuse issues, and Increase skills for wellness and  recovery  Therapeutic Interventions: Assess for all discharge needs, 1 to 1 time with Social worker, Explore available resources and support systems, Assess for adequacy in community support network, Educate family and significant other(s) on suicide prevention, Complete Psychosocial Assessment, Interpersonal group therapy.  Evaluation of Outcomes: Progressing   Progress in Treatment: Attending groups: Yes. Participating in groups: Yes. Taking medication as prescribed: Yes. Toleration medication: Yes. Family/Significant other contact made: Yes, individual(s) contacted:  Fiance  Patient understands diagnosis: No. Discussing patient identified problems/goals with staff: Yes. Medical problems stabilized or resolved: Yes. Denies suicidal/homicidal ideation: Yes. Issues/concerns per patient self-inventory: No.   New problem(s) identified: No, Describe:  None   New Short Term/Long Term Goal(s): medication stabilization, elimination of SI thoughts, development of comprehensive mental wellness plan.   Patient Goals: "To get into a long-term substance use treatment center"  Discharge Plan or Barriers: Patient recently admitted. CSW will continue to follow and assess for appropriate referrals and possible discharge planning.   Reason for Continuation of Hospitalization: Depression Medication stabilization Suicidal ideation Withdrawal symptoms  Estimated Length of Stay: 3 to 5 days   Attendees: Patient: Nathan Koch 10/09/2020   Physician: Landry Mellow, MD 10/09/2020   Nursing:  10/09/2020   RN Care Manager: 10/09/2020   Social Worker: Melba Coon, LCSWA 10/09/2020   Recreational Therapist:  10/09/2020   Other:  10/09/2020   Other:  10/09/2020   Other: 10/09/2020     Scribe for Treatment Team: Aram Beecham, LCSWA 10/09/2020 10:52 AM

## 2020-10-10 MED ORDER — TRAZODONE HCL 50 MG PO TABS
25.0000 mg | ORAL_TABLET | Freq: Every evening | ORAL | Status: DC | PRN
  Filled 2020-10-10: qty 4

## 2020-10-10 NOTE — [facility] Group Notes (Signed)
[facility] Group Notes: (Nursing/MHT/Case Management/Adjunct)   Date:  10/10/2020  Time:  9:00 AM   Type of Therapy:  Goals group   Participation Level:  Did Not Attend   Participation Quality:     Affect:     Cognitive:     Insight:     Engagement in Group:     Modes of Intervention:  Discussion and Education   Summary of Progress/Problems:  Did not attend despite staff encouragement.     Neizan Debruhl V Juanitta Earnhardt 10/10/2020 9:00 AM 

## 2020-10-10 NOTE — Progress Notes (Signed)
[facility] MD Progress Note  10/10/2020 3:36 PM Nathan Koch  MRN:  [id number]  Reason for admission: Nathan Koch is a 30 year old male with past psychiatric diagnoses of methamphetamine use disorder, substance-induced mood disorder, depression, bipolar disorder and PTSD who was admitted from [place] ED after he presented there voluntarily reporting suicidal ideation with a plan to jump in front of traffic.  Patient also reported struggling with meth addiction and requested treatment.  He reported most recent meth use was on 10/05/2020.  In the ED patient reported stressors at home with his wife which had resulted him living on the streets for the last few days and caused him to become more depressed.  UDS in the ED was positive for amphetamines.  BAL was negative.  Objective: Medical record reviewed.  Patient's case discussed in detail with members of the treatment team.  I met with and evaluated the patient on the unit for follow-up today.  Patient reports that he continues to have depressed mood and anxiety.  He states he does not feel it is a good idea for him to return home at this time.  Patient states that the relationship with his significant other has been very stressful since he was last discharged from [facility] and that the neighborhood where they live causes him to be more likely to use drugs.  He has made no other plans for housing thus far.  Patient reports continued suicidal ideation and has fleeting thoughts of hanging himself but denies any intent or plan to harm himself in the hospital.  He reports feeling tired during the day and experiencing some mild nausea and headache.  He denies other medication side effects or physical problems.  We discussed discontinuing his gabapentin since he feels it is making him tired.  He is in agreement with this plan.  The patient states he would like to go to residential substance abuse treatment program from the hospital but he he is indecisive about whether he will  go to a long-term residential rehabilitation program.  He presents as depressed with congruent and intermittently tearful affect.  He is ruminating about his life stressors and being away from his children.  The patient slept 6.75 hours last night.  He has spent much of his time in his room during the day and has not been attending groups.  Nursing notes document that he continues to report anxious and depressed mood and suicidal ideation but no plan.  Principal Problem: Amphetamine-induced depressive disorder Diagnosis: Principal Problem:   Amphetamine-induced depressive disorder Active Problems:   Methamphetamine use disorder, moderate ([name])  Total Time spent with patient:  25 minutes  Past Psychiatric History: See admission H&P  Past Medical History:  Past Medical History:  Diagnosis Date   Anxiety    Anxiety disorder    Asthma    Bipolar 1 disorder ([place])    Depression    Methamphetamine abuse ([place])    PTSD (post-traumatic stress disorder)     Past Surgical History:  Procedure Laterality Date   CLOSED REDUCTION MANDIBLE N/A 07/02/2018   Procedure: CLOSED REDUCTION MANDIBULAR;  Surgeon: Nathan Sheffield, MD;  Location: [facility] ORS;  Service: ENT;  Laterality: N/A;   Family History:  Family History  Problem Relation Age of Onset   Asthma Mother    Cancer Mother        breast cancer   Ulcers Mother    Cancer Father        esophogeal cancer   Hypertension Father  Asthma Brother    Family Psychiatric  History: See admission H&P Social History:  Social History   Substance and Sexual Activity  Alcohol Use Yes   Alcohol/week: 13.0 standard drinks   Types: 6 Cans of beer, 7 Standard drinks or equivalent per week   Comment: previously heavy drinker 2016. most recent 4 beer/ day drinker and none since 01-11-2020     Social History   Substance and Sexual Activity  Drug Use Yes   Frequency: 7.0 times per week   Types: Methamphetamines, Marijuana   Comment: last used 09/15/20     Social History   Socioeconomic History   Marital status: Significant Other    Spouse name: Not on file   Number of children: 1   Years of education: 10   Highest education level: 10th grade  Occupational History   Not on file  Tobacco Use   Smoking status: Every Day    Packs/day: 0.50    Years: 8.00    Pack years: 4.00    Types: Cigarettes   Smokeless tobacco: Never  Vaping Use   Vaping Use: Every day   Substances: Nicotine, Flavoring  Substance and Sexual Activity   Alcohol use: Yes    Alcohol/week: 13.0 standard drinks    Types: 6 Cans of beer, 7 Standard drinks or equivalent per week    Comment: previously heavy drinker 2016. most recent 4 beer/ day drinker and none since 01-11-2020   Drug use: Yes    Frequency: 7.0 times per week    Types: Methamphetamines, Marijuana    Comment: last used 09/15/20   Sexual activity: Not on file  Other Topics Concern   Not on file  Social History Narrative   Not on file   Social Determinants of Health   Financial Resource Strain: Not on file  Food Insecurity: Not on file  Transportation Needs: Not on file  Physical Activity: Not on file  Stress: Not on file  Social Connections: Not on file   Additional Social History:                         Sleep: Good  Appetite:  Good  Current Medications: Current Facility-Administered Medications  Medication Dose Route Frequency Provider Last Rate Last Admin   acetaminophen (TYLENOL) tablet 650 mg  650 mg Oral Q6H PRN Chalmers Guest, NP       albuterol (VENTOLIN HFA) 108 (90 Base) MCG/ACT inhaler 2 puff  2 puff Inhalation Q4H PRN Arthor Captain, MD       alum & mag hydroxide-simeth (MAALOX/MYLANTA) 200-200-20 MG/5ML suspension 30 mL  30 mL Oral Q4H PRN Chalmers Guest, NP       hydrOXYzine (ATARAX/VISTARIL) tablet 25 mg  25 mg Oral TID PRN Chalmers Guest, NP   25 mg at 10/09/20 0944   magnesium hydroxide (MILK OF MAGNESIA) suspension 30 mL  30 mL Oral Daily PRN Chalmers Guest, NP       nicotine (NICODERM CQ - dosed in mg/24 hours) patch 21 mg  21 mg Transdermal Daily Arthor Captain, MD       QUEtiapine (SEROQUEL) tablet 100 mg  100 mg Oral QHS Chalmers Guest, NP   100 mg at 10/09/20 2148   sertraline (ZOLOFT) tablet 50 mg  50 mg Oral QHS Chalmers Guest, NP   50 mg at 10/09/20 2148   traZODone (DESYREL) tablet 25 mg  25 mg Oral QHS PRN  Arthor Captain, MD        Lab Results:  Results for orders placed or performed during the hospital encounter of 10/07/20 (from the past 48 hour(s))  Lipid panel     Status: Abnormal   Collection Time: 10/08/20  6:35 PM  Result Value Ref Range   Cholesterol 95 0 - 200 mg/dL   Triglycerides 101 <150 mg/dL   HDL 26 (L) >40 mg/dL   Total CHOL/HDL Ratio 3.7 RATIO   VLDL 20 0 - 40 mg/dL   LDL Cholesterol 49 0 - 99 mg/dL    Comment:        Total Cholesterol/HDL:CHD Risk Coronary Heart Disease Risk Table                     Men   Women  1/2 Average Risk   3.4   3.3  Average Risk       5.0   4.4  2 X Average Risk   9.6   7.1  3 X Average Risk  23.4   11.0        Use the calculated Patient Ratio above and the CHD Risk Table to determine the patient's CHD Risk.        ATP III CLASSIFICATION (LDL):  <100     mg/dL   Optimal  100-129  mg/dL   Near or Above                    Optimal  130-159  mg/dL   Borderline  160-189  mg/dL   High  >190     mg/dL   Very High Performed at [facility] [address] [place], [place] [address]   Hemoglobin A1c     Status: Abnormal   Collection Time: 10/08/20  6:35 PM  Result Value Ref Range   Hgb A1c MFr Bld 6.0 (H) 4.8 - 5.6 %    Comment: (NOTE)         Prediabetes: 5.7 - 6.4         Diabetes: >6.4         Glycemic control for adults with diabetes: <7.0    Mean Plasma Glucose 126 mg/dL    Comment: (NOTE) Performed At: [facility] [place], [place] [id number] Rush Farmer MD QZ:[id number]     Blood Alcohol level:  Lab Results   Component Value Date   [facility] <10 10/06/2020   ETH <10 22/63/3354    Metabolic Disorder Labs: Lab Results  Component Value Date   HGBA1C 6.0 (H) 10/08/2020   MPG 126 10/08/2020   MPG 131.24 09/11/2020   No results found for: PROLACTIN Lab Results  Component Value Date   CHOL 95 10/08/2020   TRIG 101 10/08/2020   HDL 26 (L) 10/08/2020   CHOLHDL 3.7 10/08/2020   VLDL 20 10/08/2020   LDLCALC 49 10/08/2020   LDLCALC 60 09/11/2020    Physical Findings: AIMS: Facial and Oral Movements Muscles of Facial Expression: None, normal Lips and Perioral Area: None, normal Jaw: None, normal Tongue: None, normal,Extremity Movements Upper (arms, wrists, hands, fingers): None, normal Lower (legs, knees, ankles, toes): None, normal, Trunk Movements Neck, shoulders, hips: None, normal, Overall Severity Severity of abnormal movements (highest score from questions above): None, normal Incapacitation due to abnormal movements: None, normal Patient's awareness of abnormal movements (rate only patient's report): No Awareness, Dental Status Current problems with teeth and/or dentures?: No Does patient usually wear dentures?: No  CIWA:    COWS:     Musculoskeletal: Strength & Muscle Tone: within normal limits Gait & Station: normal Patient leans: N/A  Psychiatric Specialty Exam:  Presentation  General Appearance: Appropriate for Environment  Eye Contact:Fair  Speech:Clear and Coherent; Normal Rate  Speech Volume:Normal  Handedness:Right   Mood and Affect  Mood:Depressed; Anxious  Affect:Depressed; Tearful   Thought Process  Thought Processes:Coherent; Goal Directed  Descriptions of Associations:Intact  Orientation:Full (Time, Place and Person)  Thought Content:Rumination  History of Schizophrenia/Schizoaffective disorder:No  Duration of Psychotic Symptoms:Less than six months  Hallucinations:Hallucinations: None  Ideas of Reference:None  Suicidal  Thoughts:Suicidal Thoughts: Yes, Active SI Active Intent and/or Plan: Without Intent  Homicidal Thoughts:Homicidal Thoughts: No   Sensorium  Memory:Immediate Fair; Recent Fair; Remote Fair  Judgment:Fair  Insight:Shallow   Executive Functions  Concentration:Fair  Attention Span:Fair  Bertie  Language:Good   Psychomotor Activity  Psychomotor Activity:Psychomotor Activity: Normal   Assets  Assets:Communication Skills; Desire for Improvement; Social Support; Vocational/Educational   Sleep  Sleep:Sleep: Good Number of Hours of Sleep: 6.75    Physical Exam: Physical Exam Vitals and nursing note reviewed.  Constitutional:      Appearance: He is not diaphoretic.  HENT:     Head: Normocephalic and atraumatic.  Pulmonary:     Effort: Pulmonary effort is normal.  Neurological:     General: No focal deficit present.     Mental Status: He is alert and oriented to person, place, and time.   Review of Systems  Constitutional:  Positive for malaise/fatigue. Negative for chills and fever.  Respiratory:  Negative for cough and shortness of breath.   Cardiovascular:  Negative for chest pain and palpitations.  Gastrointestinal:  Positive for nausea. Negative for constipation, diarrhea and vomiting.  Genitourinary: Negative.   Musculoskeletal: Negative.   Neurological:  Positive for headaches. Negative for dizziness and tremors.  Psychiatric/Behavioral:  Positive for depression, substance abuse and suicidal ideas. Negative for hallucinations. The patient is nervous/anxious.   All other systems reviewed and are negative. Blood pressure (!) 95/55, pulse 79, temperature 97.7 F (36.5 C), temperature source Oral, resp. rate 20, SpO2 96 %. There is no height or weight on file to calculate BMI.   Treatment Plan Summary: Daily contact with patient to assess and evaluate symptoms and progress in treatment and Medication management  Continue every  15-minute safety checks  Encourage participation in group therapy and therapeutic milieu  Depression -Continue Zoloft 50 mg daily -Continue Seroquel 100 mg at bedtime  Anxiety/methamphetamine withdrawal -Discontinue gabapentin 200 mg 3 times daily due to daytime fatigue -Continue Vistaril 25 mg 3 times daily PRN  Methamphetamine use disorder -Have advised patient to strongly consider long-term residential substance abuse treatment program.  Patient is ambivalent at this time.  Insomnia -Start trazodone 25 mg at bedtime PRN  Asthma -Continue albuterol inhaler 2 puffs every 4 hours PRN  Discharge planning in progress.  Housing plan needs to be clarified.   Arthor Captain, MD 10/10/2020, 3:36 PM

## 2020-10-10 NOTE — Progress Notes (Signed)
[facility] Group Notes:  (Nursing/MHT/Case Management/Adjunct)  Date:  10/10/2020  Time:  2015  Type of Therapy:   wrap up group  Participation Level:  Active  Participation Quality:  Appropriate, Attentive, Sharing, and Supportive  Affect:  Appropriate  Cognitive:  Alert  Insight:  Improving  Engagement in Group:  Engaged  Modes of Intervention:  Clarification, Education, and Support  Summary of Progress/Problems: Positive thinking and positive change were discussed.    Marcille Buffy 10/10/2020, 9:29 PM

## 2020-10-10 NOTE — Plan of Care (Signed)
Nurse discussed anxiety, depression and coping skills with patient.  

## 2020-10-10 NOTE — Progress Notes (Signed)
D:  Patient's self inventory sheet, patient stated he does have SI thoughts, no plan, contracts for safety.  Denied HI.  Denied A/V hallucinations.  Patient stated he did not sleep well last night. A:  Medications administered per MD orders.  Emotional support and encouragement given patient. R: Safety maintained with 15 minute checks.

## 2020-10-11 NOTE — [facility] Group Notes (Signed)
[facility] Group Notes:  (Nursing/MHT/Case Management/Adjunct)  Date:  10/11/2020  Time:  9:38 AM  Type of Therapy:   Goals group  Participation Level:  Minimal  Participation Quality:  Attentive  Affect:  Blunted  Cognitive:  Oriented  Insight:  Improving  Engagement in Group:  Improving  Modes of Intervention:  Discussion and Education  Summary of Progress/Problems:  He reported his goal for today is "try to fix things with my family."  He slept ok with with medications.  Nathan Koch 10/11/2020, 9:38 AM

## 2020-10-11 NOTE — Progress Notes (Signed)
Rates his anxiety and depression both 8/10, hopelessness 7/10 with current stressor being "where I'm going when I leave here and family issues". Verbal outburst X 1 earlier this shift related to housing needs for discharge "y'all might as well discharge me then, y'all already put the other guy the fuck out then since the [facility] closed" at the nursing station. Pt received PRN Vistaril 25 mg PO at 0945 and reported relief at 1040 when reassessed.  Endorsed passive SI but verbally contracts for safety. Pt denies HI, AVH and pain at this time. Attended scheduled groups and was engaged in activities on and off unit.  All medications given as ordered with verbal education and effects monitored. Q 15 minutes safety checks maintained without incident. Emotional support and encouragement offered.  Pt tolerates all meals and medications well. Safety maintained on and off unit.

## 2020-10-11 NOTE — [facility] Group Notes (Signed)
[facility] Group Notes:  (Nursing/MHT/Case Management/Adjunct)  Date:  10/11/2020  Time:  12:58 PM  Type of Therapy:  Group Therapy  Participation Level:  Active  Participation Quality:  Appropriate, Attentive, and Sharing  Affect:  Anxious  Cognitive:  Alert and Appropriate  Insight:  Improving  Engagement in Group:  Engaged and Supportive  Modes of Intervention:  Activity and Rapport Building  Summary of Progress/Problems:  Nathan Koch was able to participate with the group activity.  He shared two true things about him and one that is not true.  The group was able to pick what was the untrue statements.   Nathan Koch 10/11/2020, 12:58 PM

## 2020-10-11 NOTE — Progress Notes (Signed)
     10/11/20 0400  Psych Admission Type (Psych Patients Only)  Admission Status Voluntary  Psychosocial Assessment  Patient Complaints Anxiety;Depression  Eye Contact Brief  Facial Expression Anxious  Affect Appropriate to circumstance  Speech Logical/coherent  Interaction Assertive  Motor Activity Slow  Appearance/Hygiene Unremarkable  Behavior Characteristics Anxious  Mood Depressed;Anxious  Thought Process  Coherency WDL  Content WDL  Delusions None reported or observed  Perception WDL  Hallucination None reported or observed  Judgment WDL  Confusion WDL  Danger to Self  Current suicidal ideation? Passive  Self-Injurious Behavior No self-injurious ideation or behavior indicators observed or expressed   Agreement Not to Harm Self Yes  Description of Agreement verbal contract for safety  Danger to Others  Danger to Others None reported or observed

## 2020-10-11 NOTE — Progress Notes (Signed)
Patient attended the evening A.A.meeting and was appropriate. He shared and disclosed in group as well.

## 2020-10-11 NOTE — Progress Notes (Signed)
Recreation Therapy Notes  Date:  6.17.22 Time: 0930 Location: [address] Dayroom  Group Topic: Stress Management  Goal Area(s) Addresses:  Patient will identify positive stress management techniques. Patient will identify benefits of using stress management post d/c.  Intervention: Stress Management  Activity :  Meditation.  LRT played on meditation that focused on looking at each day as a new opportunity to do something new and be productive.  Education:  Stress Management, Discharge Planning.   Education Outcome: Acknowledges Education  Clinical Observations/Feedback:  Pt did not attend group session.    Vandora Jaskulski, LRT/CTRS         Hoover Grewe A 10/11/2020 11:31 AM 

## 2020-10-11 NOTE — [facility] Suicide Risk Assessment (Signed)
[facility] INPATIENT:  Family/Significant Other Suicide Prevention Education  Suicide Prevention Education:  Education Completed; Nathan Koch, Fiance, [phone],  (name of family member/significant other) has been identified by the patient as the family member/significant other with whom the patient will be residing, and identified as the person(s) who will aid the patient in the event of a mental health crisis (suicidal ideations/suicide attempt).  With written consent from the patient, the family member/significant other has been provided the following suicide prevention education, prior to the and/or following the discharge of the patient.  The suicide prevention education provided includes the following: Suicide risk factors Suicide prevention and interventions National Suicide Hotline telephone number [facility] assessment telephone number [facility] Emergency Assistance 911 [facility] and/or Residential Mobile Crisis Unit telephone number  Request made of family/significant other to: Remove weapons (e.g., guns, rifles, knives), all items previously/currently identified as safety concern.   Remove drugs/medications (over-the-counter, prescriptions, illicit drugs), all items previously/currently identified as a safety concern.  The family member/significant other verbalizes understanding of the suicide prevention education information provided.  The family member/significant other agrees to remove the items of safety concern listed above.  CSW connected with pt's fiance regarding any presenting concerns surrounding discharge. Fiance expressed concerns regarding pt trying to get help for substance use concerns, seeking residential services, and specific needs and barriers to securing services, proving receptive to CSW feedback.  Nathan Koch 10/11/2020, 12:06 PM

## 2020-10-11 NOTE — Progress Notes (Signed)
[facility] MD Progress Note  10/11/2020 4:45 PM Nathan Koch  MRN:  [id number]  Reason for admission: Nathan Koch is a 30 year old male with past psychiatric diagnoses of methamphetamine use disorder, substance-induced mood disorder, depression, bipolar disorder and PTSD who was admitted from [place] ED after he presented there voluntarily reporting suicidal ideation with a plan to jump in front of traffic.  Patient also reported struggling with meth addiction and requested treatment.  He reported most recent meth use was on 10/05/2020.  In the ED patient reported stressors at home with his wife which had resulted him living on the streets for the last few days and caused him to become more depressed.  UDS in the ED was positive for amphetamines.  BAL was negative.  Objective: Medical record reviewed.  Patient's case discussed in detail with members of the treatment team.  I met with and evaluated the patient on the unit for follow-up today.  On interview today patient appears improved with good eye contact and brighter affect.  Patient states he is waiting to see if he can go to sober living of [place].  He describes his mood is irritable today.  He endorses middle insomnia last night but good appetite.  He denies SI, AI, AH, VH or PI.  He is experiencing some mild nausea that he attributes to his medication but denies other side effects from his medication or physical problems.  Patient reports that his wife is looking for his ID and Social Security card so that he can go to [facility] [place] but so far wife cannot find it.  He states he has no other plan for housing.  I encouraged him to speak with the social worker and call housing referrals since anticipate this will be a brief admission.  Patient stated understanding.  Patient slept 6.25 hours last night.  He has been attending groups.  Patient is observed on the unit making frequent phone calls.  He has stated conflicting plans to different staff  members about where he wants to go after discharge.  Principal Problem: Amphetamine-induced depressive disorder Diagnosis: Principal Problem:   Amphetamine-induced depressive disorder Active Problems:   Methamphetamine use disorder, moderate ([name])  Total Time spent with patient:  15 minutes  Past Psychiatric History: See admission H&P  Past Medical History:  Past Medical History:  Diagnosis Date   Anxiety    Anxiety disorder    Asthma    Bipolar 1 disorder ([place])    Depression    Methamphetamine abuse ([place])    PTSD (post-traumatic stress disorder)     Past Surgical History:  Procedure Laterality Date   CLOSED REDUCTION MANDIBLE N/A 07/02/2018   Procedure: CLOSED REDUCTION MANDIBULAR;  Surgeon: Margaretha Sheffield, MD;  Location: [facility] ORS;  Service: ENT;  Laterality: N/A;   Family History:  Family History  Problem Relation Age of Onset   Asthma Mother    Cancer Mother        breast cancer   Ulcers Mother    Cancer Father        esophogeal cancer   Hypertension Father    Asthma Brother    Family Psychiatric  History: See admission H&P Social History:  Social History   Substance and Sexual Activity  Alcohol Use Yes   Alcohol/week: 13.0 standard drinks   Types: 6 Cans of beer, 7 Standard drinks or equivalent per week   Comment: previously heavy drinker 2016. most recent 4 beer/ day drinker and none since 01-11-2020  Social History   Substance and Sexual Activity  Drug Use Yes   Frequency: 7.0 times per week   Types: Methamphetamines, Marijuana   Comment: last used 09/15/20    Social History   Socioeconomic History   Marital status: Significant Other    Spouse name: Not on file   Number of children: 1   Years of education: 10   Highest education level: 10th grade  Occupational History   Not on file  Tobacco Use   Smoking status: Every Day    Packs/day: 0.50    Years: 8.00    Pack years: 4.00    Types: Cigarettes   Smokeless tobacco: Never  Vaping Use    Vaping Use: Every day   Substances: Nicotine, Flavoring  Substance and Sexual Activity   Alcohol use: Yes    Alcohol/week: 13.0 standard drinks    Types: 6 Cans of beer, 7 Standard drinks or equivalent per week    Comment: previously heavy drinker 2016. most recent 4 beer/ day drinker and none since 01-11-2020   Drug use: Yes    Frequency: 7.0 times per week    Types: Methamphetamines, Marijuana    Comment: last used 09/15/20   Sexual activity: Not on file  Other Topics Concern   Not on file  Social History Narrative   Not on file   Social Determinants of Health   Financial Resource Strain: Not on file  Food Insecurity: Not on file  Transportation Needs: Not on file  Physical Activity: Not on file  Stress: Not on file  Social Connections: Not on file   Additional Social History:                         Sleep: Fair  Appetite:  Good  Current Medications: Current Facility-Administered Medications  Medication Dose Route Frequency Provider Last Rate Last Admin   acetaminophen (TYLENOL) tablet 650 mg  650 mg Oral Q6H PRN Chalmers Guest, NP       albuterol (VENTOLIN HFA) 108 (90 Base) MCG/ACT inhaler 2 puff  2 puff Inhalation Q4H PRN Arthor Captain, MD       alum & mag hydroxide-simeth (MAALOX/MYLANTA) 200-200-20 MG/5ML suspension 30 mL  30 mL Oral Q4H PRN Chalmers Guest, NP       hydrOXYzine (ATARAX/VISTARIL) tablet 25 mg  25 mg Oral TID PRN Chalmers Guest, NP   25 mg at 10/11/20 0945   magnesium hydroxide (MILK OF MAGNESIA) suspension 30 mL  30 mL Oral Daily PRN Chalmers Guest, NP       QUEtiapine (SEROQUEL) tablet 100 mg  100 mg Oral QHS Chalmers Guest, NP   100 mg at 10/10/20 2100   sertraline (ZOLOFT) tablet 50 mg  50 mg Oral QHS Chalmers Guest, NP   50 mg at 10/10/20 2100   traZODone (DESYREL) tablet 25 mg  25 mg Oral QHS PRN Arthor Captain, MD        Lab Results:  No results found for this or any previous visit (from the past 48 hour(s)).   Blood Alcohol  level:  Lab Results  Component Value Date   [facility] <10 10/06/2020   ETH <10 00/93/8182    Metabolic Disorder Labs: Lab Results  Component Value Date   HGBA1C 6.0 (H) 10/08/2020   MPG 126 10/08/2020   MPG 131.24 09/11/2020   No results found for: PROLACTIN Lab Results  Component Value Date   CHOL  95 10/08/2020   TRIG 101 10/08/2020   HDL 26 (L) 10/08/2020   CHOLHDL 3.7 10/08/2020   VLDL 20 10/08/2020   LDLCALC 49 10/08/2020   LDLCALC 60 09/11/2020    Physical Findings: AIMS: Facial and Oral Movements Muscles of Facial Expression: None, normal Lips and Perioral Area: None, normal Jaw: None, normal Tongue: None, normal,Extremity Movements Upper (arms, wrists, hands, fingers): None, normal Lower (legs, knees, ankles, toes): None, normal, Trunk Movements Neck, shoulders, hips: None, normal, Overall Severity Severity of abnormal movements (highest score from questions above): None, normal Incapacitation due to abnormal movements: None, normal Patient's awareness of abnormal movements (rate only patient's report): No Awareness, Dental Status Current problems with teeth and/or dentures?: No Does patient usually wear dentures?: No  CIWA:    COWS:     Musculoskeletal: Strength & Muscle Tone: within normal limits Gait & Station: normal Patient leans: N/A  Psychiatric Specialty Exam:  Presentation  General Appearance: Appropriate for Environment  Eye Contact:Good  Speech:Clear and Coherent  Speech Volume:Normal  Handedness:Right   Mood and Affect  Mood:Irritable  Affect:Congruent   Thought Process  Thought Processes:Coherent; Goal Directed  Descriptions of Associations:Intact  Orientation:Full (Time, Place and Person)  Thought Content:Logical  History of Schizophrenia/Schizoaffective disorder:No  Duration of Psychotic Symptoms:Less than six months  Hallucinations:Hallucinations: None  Ideas of Reference:None  Suicidal Thoughts:Suicidal Thoughts:  No SI Active Intent and/or Plan: Without Intent  Homicidal Thoughts:Homicidal Thoughts: No   Sensorium  Memory:Immediate Fair; Recent Fair; Remote Fair  Judgment:Fair  Insight:Shallow   Executive Functions  Concentration:Fair  Attention Span:Fair  [place]  Language:Good   Psychomotor Activity  Psychomotor Activity:Psychomotor Activity: Normal   Assets  Assets:Communication Skills; Desire for Improvement; Social Support; Vocational/Educational   Sleep  Sleep:Sleep: Fair Number of Hours of Sleep: 6.25    Physical Exam: Physical Exam Vitals and nursing note reviewed.  Constitutional:      General: He is not in acute distress.    Appearance: He is not diaphoretic.  HENT:     Head: Normocephalic and atraumatic.  Pulmonary:     Effort: Pulmonary effort is normal.  Neurological:     General: No focal deficit present.     Mental Status: He is alert and oriented to person, place, and time.   Review of Systems  Constitutional:  Positive for malaise/fatigue. Negative for chills and fever.  Respiratory:  Negative for cough and shortness of breath.   Cardiovascular:  Negative for chest pain and palpitations.  Gastrointestinal:  Positive for nausea. Negative for constipation, diarrhea and vomiting.  Genitourinary: Negative.   Musculoskeletal: Negative.   Neurological:  Negative for dizziness, tremors and headaches.  Psychiatric/Behavioral:  Positive for substance abuse. Negative for hallucinations and suicidal ideas. The patient has insomnia.   All other systems reviewed and are negative. Blood pressure (!) 110/56, pulse 60, temperature 97.7 F (36.5 C), temperature source Oral, resp. rate 20, SpO2 96 %. There is no height or weight on file to calculate BMI.   Treatment Plan Summary: Daily contact with patient to assess and evaluate symptoms and progress in treatment and Medication management  Continue every 15-minute safety  checks  Encourage participation in group therapy and therapeutic milieu  Depression -Continue Zoloft 50 mg daily -Continue Seroquel 100 mg at bedtime  Anxiety  -Continue Vistaril 25 mg 3 times daily PRN  Methamphetamine use disorder -Have advised patient to strongly consider long-term residential substance abuse treatment program.  Patient remains ambivalent.  Insomnia -Continue trazodone  25 mg at bedtime PRN  Asthma -Continue albuterol inhaler 2 puffs every 4 hours PRN  Discharge planning in progress.  Residential rehab versus housing plan needs to be clarified.   Arthor Captain, MD 10/11/2020, 4:45 PM

## 2020-10-12 NOTE — [facility] Group Notes (Signed)
  [facility] LCSW Group Therapy Note  Date/Time:  10/12/2020 110-11am  Type of Therapy and Topic:  Group Therapy:  Feelings About Hospitalization  Participation Level:  Active   Description of Group This process group involved patients discussing their feelings related to being hospitalized, as well as the benefits they see to being in the hospital.  These feelings and benefits were itemized.  The group then brainstormed specific ways in which they could seek those same benefits when they discharge and return home.  Therapeutic Goals Patient will identify and describe positive and negative feelings related to hospitalization Patient will verbalize benefits of hospitalization to themselves personally Patients will brainstorm together ways they can obtain similar benefits in the outpatient setting, identify barriers to wellness and possible solutions  Summary of Patient Progress:  The patient expressed his primary feelings about being hospitalized are that it is safe and the structure is good for him.  He does not know what he is going to do at discharge to continue to have the same types of positive things in his life as are available here in the hospital.  Therapeutic Modalities Cognitive Behavioral Therapy Motivational Interviewing    Ambrose Mantle, LCSW 10/12/2020, 1:40 PM

## 2020-10-12 NOTE — Progress Notes (Signed)
[facility] MD Progress Note  10/12/2020 12:22 PM HAWTHORNE DAY  MRN:  [id number]  Nathan Koch reports, "It is going okay, just some depression going on due to my family situation. I did not sleep well last night because I was up worrying about where to go from here. My bad situation with my family is due to my substance use. I'm still feeling like hurting myself but not anyone else".   Reason for admission: Nathan Koch is a 30 year old male with past psychiatric diagnoses of methamphetamine use disorder, substance-induced mood disorder, depression, bipolar disorder and PTSD who was admitted from [place] ED after he presented there voluntarily reporting suicidal ideation with a plan to jump in front of traffic.  Patient also reported struggling with meth addiction and requested treatment.  He reported most recent meth use was on 10/05/2020.  In the ED patient reported stressors at home with his wife which had resulted him living on the streets for the last few days and caused him to become more depressed.  UDS in the ED was positive for amphetamines.  BAL was negative.  Objective: Medical record reviewed.  Patient's case discussed in detail with members of the treatment team.  I met with and evaluated the patient on the unit for follow-up today.  On interview today patient appears improved with good eye contact and brighter affect.  Patient stated he is waiting to see if he can go to sober living [facility] [place], but at this time, this has not been a reality.  He describes his mood as depressed today due to his family situation. He endorses insomnia last night because he was worrying about where to go from here after discharge.  He endorses SI with no plans or intent, but denies AI, AH, VH or PI. He is taking & tolerating his treatment regimen, denies any side effects. Patient reported that his wife was looking for his ID and Social Security card so that he can go to sober living [facility] [place] but so far wife cannot find it.   He states he has no other plan for housing.  I encouraged him to speak with the social worker and call housing referrals since anticipate this will be a brief admission.The SW talked with patient's wife today, wife indicated that patient may still come around the house, but will not be allowed to stay. Patient slept for 6.75 hours last night, although he reported that he did not sleep well.  He has been attending groups.  Patient is visible on the unit, no behavioral problems noted.  He has stated conflicting plans to different staff members about where he wants to go after discharge.  Principal Problem: Amphetamine-induced depressive disorder Diagnosis: Principal Problem:   Amphetamine-induced depressive disorder Active Problems:   Methamphetamine use disorder, moderate ([name])  Total Time spent with patient:  15 minutes  Past Psychiatric History: See admission H&P  Past Medical History:  Past Medical History:  Diagnosis Date   Anxiety    Anxiety disorder    Asthma    Bipolar 1 disorder ([place])    Depression    Methamphetamine abuse ([place])    PTSD (post-traumatic stress disorder)     Past Surgical History:  Procedure Laterality Date   CLOSED REDUCTION MANDIBLE N/A 07/02/2018   Procedure: CLOSED REDUCTION MANDIBULAR;  Surgeon: Margaretha Sheffield, MD;  Location: [facility] ORS;  Service: ENT;  Laterality: N/A;   Family History:  Family History  Problem Relation Age of Onset   Asthma Mother  Cancer Mother        breast cancer   Ulcers Mother    Cancer Father        esophogeal cancer   Hypertension Father    Asthma Brother    Family Psychiatric  History: See admission H&P Social History:  Social History   Substance and Sexual Activity  Alcohol Use Yes   Alcohol/week: 13.0 standard drinks   Types: 6 Cans of beer, 7 Standard drinks or equivalent per week   Comment: previously heavy drinker 2016. most recent 4 beer/ day drinker and none since 01-11-2020     Social History   Substance  and Sexual Activity  Drug Use Yes   Frequency: 7.0 times per week   Types: Methamphetamines, Marijuana   Comment: last used 09/15/20    Social History   Socioeconomic History   Marital status: Significant Other    Spouse name: Not on file   Number of children: 1   Years of education: 10   Highest education level: 10th grade  Occupational History   Not on file  Tobacco Use   Smoking status: Every Day    Packs/day: 0.50    Years: 8.00    Pack years: 4.00    Types: Cigarettes   Smokeless tobacco: Never  Vaping Use   Vaping Use: Every day   Substances: Nicotine, Flavoring  Substance and Sexual Activity   Alcohol use: Yes    Alcohol/week: 13.0 standard drinks    Types: 6 Cans of beer, 7 Standard drinks or equivalent per week    Comment: previously heavy drinker 2016. most recent 4 beer/ day drinker and none since 01-11-2020   Drug use: Yes    Frequency: 7.0 times per week    Types: Methamphetamines, Marijuana    Comment: last used 09/15/20   Sexual activity: Not on file  Other Topics Concern   Not on file  Social History Narrative   Not on file   Social Determinants of Health   Financial Resource Strain: Not on file  Food Insecurity: Not on file  Transportation Needs: Not on file  Physical Activity: Not on file  Stress: Not on file  Social Connections: Not on file   Additional Social History:                         Sleep: Fair  Appetite:  Good  Current Medications: Current Facility-Administered Medications  Medication Dose Route Frequency Provider Last Rate Last Admin   acetaminophen (TYLENOL) tablet 650 mg  650 mg Oral Q6H PRN Chalmers Guest, NP       albuterol (VENTOLIN HFA) 108 (90 Base) MCG/ACT inhaler 2 puff  2 puff Inhalation Q4H PRN Arthor Captain, MD       alum & mag hydroxide-simeth (MAALOX/MYLANTA) 200-200-20 MG/5ML suspension 30 mL  30 mL Oral Q4H PRN Chalmers Guest, NP       hydrOXYzine (ATARAX/VISTARIL) tablet 25 mg  25 mg Oral TID PRN  Chalmers Guest, NP   25 mg at 10/12/20 1200   magnesium hydroxide (MILK OF MAGNESIA) suspension 30 mL  30 mL Oral Daily PRN Chalmers Guest, NP       QUEtiapine (SEROQUEL) tablet 100 mg  100 mg Oral QHS Chalmers Guest, NP   100 mg at 10/11/20 2101   sertraline (ZOLOFT) tablet 50 mg  50 mg Oral QHS Chalmers Guest, NP   50 mg at 10/11/20 2101   traZODone (  DESYREL) tablet 25 mg  25 mg Oral QHS PRN Arthor Captain, MD        Lab Results:  No results found for this or any previous visit (from the past 48 hour(s)).   Blood Alcohol level:  Lab Results  Component Value Date   ETH <10 10/06/2020   ETH <10 60/01/9322    Metabolic Disorder Labs: Lab Results  Component Value Date   HGBA1C 6.0 (H) 10/08/2020   MPG 126 10/08/2020   MPG 131.24 09/11/2020   No results found for: PROLACTIN Lab Results  Component Value Date   CHOL 95 10/08/2020   TRIG 101 10/08/2020   HDL 26 (L) 10/08/2020   CHOLHDL 3.7 10/08/2020   VLDL 20 10/08/2020   LDLCALC 49 10/08/2020   LDLCALC 60 09/11/2020    Physical Findings: AIMS: Facial and Oral Movements Muscles of Facial Expression: None, normal Lips and Perioral Area: None, normal Jaw: None, normal Tongue: None, normal,Extremity Movements Upper (arms, wrists, hands, fingers): None, normal Lower (legs, knees, ankles, toes): None, normal, Trunk Movements Neck, shoulders, hips: None, normal, Overall Severity Severity of abnormal movements (highest score from questions above): None, normal Incapacitation due to abnormal movements: None, normal Patient's awareness of abnormal movements (rate only patient's report): No Awareness, Dental Status Current problems with teeth and/or dentures?: No Does patient usually wear dentures?: No  CIWA:    COWS:     Musculoskeletal: Strength & Muscle Tone: within normal limits Gait & Station: normal Patient leans: N/A  Psychiatric Specialty Exam:  Presentation  General Appearance: Appropriate for  Environment  Eye Contact:Good  Speech:Clear and Coherent  Speech Volume:Normal  Handedness:Right   Mood and Affect  Mood:Irritable  Affect:Congruent   Thought Process  Thought Processes:Coherent; Goal Directed  Descriptions of Associations:Intact  Orientation:Full (Time, Place and Person)  Thought Content:Logical  History of Schizophrenia/Schizoaffective disorder:No  Duration of Psychotic Symptoms:Less than six months  Hallucinations:Hallucinations: None  Ideas of Reference:None  Suicidal Thoughts:Suicidal Thoughts: No  Homicidal Thoughts:Homicidal Thoughts: No   Sensorium  Memory:Immediate Fair; Recent Fair; Remote Fair  Judgment:Fair  Insight:Shallow   Executive Functions  Concentration:Fair  Attention Span:Fair  [place]  Language:Good   Psychomotor Activity  Psychomotor Activity:Psychomotor Activity: Normal   Assets  Assets:Communication Skills; Desire for Improvement; Social Support; Vocational/Educational   Sleep  Sleep:Sleep: Fair Number of Hours of Sleep: 6.75  Physical Exam: Physical Exam Vitals and nursing note reviewed.  Constitutional:      General: He is not in acute distress.    Appearance: He is not diaphoretic.  HENT:     Head: Normocephalic and atraumatic.  Pulmonary:     Effort: Pulmonary effort is normal.  Neurological:     General: No focal deficit present.     Mental Status: He is alert and oriented to person, place, and time.   Review of Systems  Constitutional:  Positive for malaise/fatigue. Negative for chills and fever.  Respiratory:  Negative for cough and shortness of breath.   Cardiovascular:  Negative for chest pain and palpitations.  Gastrointestinal:  Positive for nausea. Negative for constipation, diarrhea and vomiting.  Genitourinary: Negative.   Musculoskeletal: Negative.   Neurological:  Negative for dizziness, tremors and headaches.  Psychiatric/Behavioral:   Positive for substance abuse. Negative for hallucinations and suicidal ideas. The patient has insomnia.   All other systems reviewed and are negative. Blood pressure 113/63, pulse 70, temperature 98.1 F (36.7 C), temperature source Oral, resp. rate 20, SpO2 100 %. There  is no height or weight on file to calculate BMI.  Treatment Plan Summary: Daily contact with patient to assess and evaluate symptoms and progress in treatment and Medication management.   Continue inpatient hospitalization.  Will continue today 10/12/2020 plan as below except where it is noted.   Continue every 15-minute safety checks Encourage participation in group therapy and therapeutic milieu  Depression -Continue Zoloft 50 mg daily -Continue Seroquel 100 mg at bedtime  Anxiety  -Continue Vistaril 25 mg 3 times daily PRN  Methamphetamine use disorder -Have advised patient to strongly consider long-term residential substance abuse treatment program.  Patient remains ambivalent.  Insomnia -Continue trazodone 25 mg at bedtime PRN  Asthma -Continue albuterol inhaler 2 puffs every 4 hours PRN  Discharge planning in progress.  Residential rehab versus housing plan needs to be clarified.  Lindell Spar, NP, pmhnp, [id number] 10/12/2020, 12:22 PM

## 2020-10-12 NOTE — Progress Notes (Signed)
   10/12/20 2353  Psych Admission Type (Psych Patients Only)  Admission Status Voluntary  Psychosocial Assessment  Patient Complaints None  Eye Contact Fair  Facial Expression Animated  Affect Appropriate to circumstance  Speech Logical/coherent  Interaction Assertive  Motor Activity Other (Comment) (WDL)  Behavior Characteristics Appropriate to situation  Mood Pleasant  Thought Process  Coherency WDL  Content WDL  Delusions None reported or observed  Perception WDL  Hallucination None reported or observed  Judgment Impaired  Confusion None  Danger to Self  Current suicidal ideation? Denies  Self-Injurious Behavior No self-injurious ideation or behavior indicators observed or expressed   Agreement Not to Harm Self Yes  Description of Agreement verbal contract for safety  Danger to Others  Danger to Others None reported or observed

## 2020-10-12 NOTE — [facility] Group Notes (Signed)
[facility] Group Notes:  (Nursing/MHT/Case Management/Adjunct)  Date:  10/12/2020  Time:  9:50 AM  Type of Therapy:  Goals group  Participation Level:  Did Not Attend  Participation Quality:     Affect:    Cognitive:    Insight:    Engagement in Group:    Modes of Intervention:  Discussion and Education  Summary of Progress/Problems:   Did not attend despite staff encouragement.  Nathan Koch V [name] 10/12/2020, 9:50 AM  

## 2020-10-12 NOTE — Progress Notes (Signed)
Pt has been observed interacting well with peers. Attended AA group and liked it. Became loud over the phone with a family. Took evening meds with no problems, denied SI/HI, will continue to monitor.

## 2020-10-12 NOTE — [facility] Counselor (Signed)
Clinical Social Work Note  At doctor request, CSW called patient's fiancee Cheri Guppy [phone], to clarify the issues surrounding patient's ID and social security card.  She stated he has looked for these previously, she has looked for them, and neither of them can find these items.  She believes he lost them at some point.  She stated she is not worried about the patient possibly being discharged today or tomorrow.  If he comes to the house, her mother will call the police.  He will not be allowed to stay there.  She feels bad about him being homeless, but recognizes it is because of his own actions.  CSW shared his plan and reminded her that he is in charge of calling [facility] himself.  We also discussed how he will need to apply for a new ID and social security card and this will take some time, but he can then use them to get into treatment.  Ambrose Mantle, LCSW 10/12/2020, 9:17 AM

## 2020-10-12 NOTE — Progress Notes (Signed)
[facility] Group Notes:  (Nursing/MHT/Case Management/Adjunct)  Date:  10/12/2020  Time:  2015 Type of Therapy:   wrap up group  Participation Level:  Active  Participation Quality:  Appropriate, Attentive, Sharing, and Supportive  Affect:  Appropriate  Cognitive:  Alert  Insight:  Improving  Engagement in Group:  Engaged  Modes of Intervention:  Clarification, Education, and Support  Summary of Progress/Problems: Positive thinking and self-care were discussed.   Marcille Buffy 10/12/2020, 8:55 PM

## 2020-10-13 NOTE — Progress Notes (Signed)
   10/13/20 2315  Psych Admission Type (Psych Patients Only)  Admission Status Voluntary  Psychosocial Assessment  Patient Complaints None  Eye Contact Fair  Facial Expression Animated  Affect Appropriate to circumstance  Speech Logical/coherent  Interaction Assertive  Motor Activity Other (Comment) (WDL)  Appearance/Hygiene Unremarkable  Behavior Characteristics Appropriate to situation  Mood Pleasant  Thought Process  Coherency WDL  Content WDL  Delusions None reported or observed  Perception WDL  Hallucination None reported or observed  Judgment Impaired  Confusion WDL  Danger to Self  Current suicidal ideation? Denies  Self-Injurious Behavior No self-injurious ideation or behavior indicators observed or expressed   Agreement Not to Harm Self Yes  Description of Agreement verbal contract for safety  Danger to Others  Danger to Others None reported or observed

## 2020-10-13 NOTE — [facility] Group Notes (Signed)
Patient did not attend morning orientation and goal setting group, although he was made aware of the group.  

## 2020-10-13 NOTE — Progress Notes (Signed)
[facility] Group Notes:  (Nursing/MHT/Case Management/Adjunct)  Date:  10/13/2020  Time:  2015  Type of Therapy:   wrap up gruop  Participation Level:  Active  Participation Quality:  Appropriate and Sharing  Affect:  Appropriate  Cognitive:  Alert  Insight:  Improving  Engagement in Group:  Engaged  Modes of Intervention:  Clarification, Education, and Support  Summary of Progress/Problems: Positive thinking and self-care were discussed.   N[facility] 10/13/2020, 9:26 PM

## 2020-10-13 NOTE — Progress Notes (Signed)
   10/13/20 1100  Psych Admission Type (Psych Patients Only)  Admission Status Voluntary  Psychosocial Assessment  Patient Complaints Anxiety;Depression;Sleep disturbance  Eye Contact Fair  Facial Expression Sad  Affect Appropriate to circumstance  Speech Logical/coherent  Interaction Assertive  Motor Activity Other (Comment) (WDL)  Appearance/Hygiene Unremarkable  Behavior Characteristics Appropriate to situation  Mood Pleasant  Thought Process  Coherency WDL  Content WDL  Delusions None reported or observed  Perception WDL  Hallucination None reported or observed  Judgment Impaired  Confusion None  Danger to Self  Current suicidal ideation? Denies  Self-Injurious Behavior No self-injurious ideation or behavior indicators observed or expressed   Agreement Not to Harm Self Yes  Description of Agreement verbal contract for safety  Danger to Others  Danger to Others None reported or observed

## 2020-10-13 NOTE — [facility] Group Notes (Signed)
[facility] LCSW Group Therapy Note  10/13/2020  10:00-11:00AM  Type of Therapy and Topic:  Group Therapy:  Acknowledging and Resolving Issues with Fathers  Participation Level:  Active   Description of Group:   Patients in this group were asked to briefly describe their experience with the father figure(s) in their lives, both in childhood and adulthood.  Different types of support provided by these individuals were identified.   Patients were then encouraged to determine whether their father figure was or is a healthy or unhealthy support.  The manner in which that early relationship has shaped patient's feelings and life decisions was pointed out and acknowledged.  Group members gave support to each other.  CSW led a discussion on how helpful it can be to resolve past issues, and how this can be done whether the father figure is now alive or already deceased.  An emphasis was placed on continuing to work with a therapist on these issues  when patients leave the hospital in order to be able to focus on the future instead of the past, to continue becoming healthier and happier.   Therapeutic Goals: 1)  discuss the possibility of father figure(s) being positive and/or negative in one's life, normalizing that some people never had positive experiences with "paternal" persons  2)  describe patient's specific example of father figure(s), allowing time to vent  3)  identify the patient's current need for resolution in the relationship with the aforementioned person  4)  elicit commitments to work on resolving feelings about father figure(s) in order to move forward in life and wellness   Summary of Patient Progress:  The patient expressed full comprehension of the concepts presented, and he was in agreement with many points that other patients throughout the groups.  He stated that the topic is "touchy" for him and he had to go to therapy for it, because he never really knew his father who was not "there" for  him, although he knew who his father was.  He stated there was somebody who took care of him during his life like a father might, but it was only after his mother, this other man, and his brother were deceased that father came back into his life trying to be a part of his life.  He stated he knows he needs to exercise forgiveness, not for father but for himself, but it is difficult.   Therapeutic Modalities:   Processing Brief Solution-Focused Therapy  Lynnell Chad

## 2020-10-13 NOTE — Progress Notes (Signed)
[facility] MD Progress Note  10/13/2020 1:47 PM [facility] Koch  MRN:  [id number] Subjective: Patient is a 30 year old male with a past psychiatric history significant for methamphetamine use disorder/dependence, substance-induced mood disorder, depression, bipolar disorder as well as posttraumatic stress disorder who was admitted from the [facility] emergency department after he presented there voluntarily with suicidal ideation.  Objective: Patient is seen and examined.  Patient is a 30 year old male with the above-stated past psychiatric history who is seen in follow-up.  He had no complaints today.  He stated he was told that his wife found his ID, and that apparently she photocopied and faxed that here.  We discussed the fact that he would be able to be discharged tomorrow to the [facility], and obtain resources for his housing issues.  He denied any suicidal ideation.  He denied any psychotic symptoms.  He had no other complaints.  His vital signs are stable, he is afebrile.  He slept 6.5 hours.  He denied any side effects to his medications.  No new laboratories.  Principal Problem: Amphetamine-induced depressive disorder Diagnosis: Principal Problem:   Amphetamine-induced depressive disorder Active Problems:   Methamphetamine use disorder, moderate ([name])  Total Time spent with patient: 15 minutes  Past Psychiatric History: See admission H&P  Past Medical History:  Past Medical History:  Diagnosis Date   Anxiety    Anxiety disorder    Asthma    Bipolar 1 disorder ([name])    Depression    Methamphetamine abuse ([name])    PTSD (post-traumatic stress disorder)     Past Surgical History:  Procedure Laterality Date   CLOSED REDUCTION MANDIBLE N/A 07/02/2018   Procedure: CLOSED REDUCTION MANDIBULAR;  Surgeon: Vernie Murders, MD;  Location: [facility] ORS;  Service: ENT;  Laterality: N/A;   Family History:  Family History  Problem Relation Age of Onset   Asthma Mother    Cancer Mother        breast cancer    Ulcers Mother    Cancer Father        esophogeal cancer   Hypertension Father    Asthma Brother    Family Psychiatric  History: See admission H&P Social History:  Social History   Substance and Sexual Activity  Alcohol Use Yes   Alcohol/week: 13.0 standard drinks   Types: 6 Cans of beer, 7 Standard drinks or equivalent per week   Comment: previously heavy drinker 2016. most recent 4 beer/ day drinker and none since 01-11-2020     Social History   Substance and Sexual Activity  Drug Use Yes   Frequency: 7.0 times per week   Types: Methamphetamines, Marijuana   Comment: last used 09/15/20    Social History   Socioeconomic History   Marital status: Significant Other    Spouse name: Not on file   Number of children: 1   Years of education: 10   Highest education level: 10th grade  Occupational History   Not on file  Tobacco Use   Smoking status: Every Day    Packs/day: 0.50    Years: 8.00    Pack years: 4.00    Types: Cigarettes   Smokeless tobacco: Never  Vaping Use   Vaping Use: Every day   Substances: Nicotine, Flavoring  Substance and Sexual Activity   Alcohol use: Yes    Alcohol/week: 13.0 standard drinks    Types: 6 Cans of beer, 7 Standard drinks or equivalent per week    Comment: previously heavy drinker 2016. most recent  4 beer/ day drinker and none since 01-11-2020   Drug use: Yes    Frequency: 7.0 times per week    Types: Methamphetamines, Marijuana    Comment: last used 09/15/20   Sexual activity: Not on file  Other Topics Concern   Not on file  Social History Narrative   Not on file   Social Determinants of Health   Financial Resource Strain: Not on file  Food Insecurity: Not on file  Transportation Needs: Not on file  Physical Activity: Not on file  Stress: Not on file  Social Connections: Not on file   Additional Social History:                         Sleep: Good  Appetite:  Good  Current Medications: Current  Facility-Administered Medications  Medication Dose Route Frequency Provider Last Rate Last Admin   acetaminophen (TYLENOL) tablet 650 mg  650 mg Oral Q6H PRN Novella Olive, NP       albuterol (VENTOLIN HFA) 108 (90 Base) MCG/ACT inhaler 2 puff  2 puff Inhalation Q4H PRN Claudie Revering, MD       alum & mag hydroxide-simeth (MAALOX/MYLANTA) 200-200-20 MG/5ML suspension 30 mL  30 mL Oral Q4H PRN Novella Olive, NP       hydrOXYzine (ATARAX/VISTARIL) tablet 25 mg  25 mg Oral TID PRN Novella Olive, NP   25 mg at 10/12/20 2113   magnesium hydroxide (MILK OF MAGNESIA) suspension 30 mL  30 mL Oral Daily PRN Novella Olive, NP       QUEtiapine (SEROQUEL) tablet 100 mg  100 mg Oral QHS Novella Olive, NP   100 mg at 10/12/20 2113   sertraline (ZOLOFT) tablet 50 mg  50 mg Oral QHS Novella Olive, NP   50 mg at 10/12/20 2114   traZODone (DESYREL) tablet 25 mg  25 mg Oral QHS PRN Claudie Revering, MD        Lab Results: No results found for this or any previous visit (from the past 48 hour(s)).  Blood Alcohol level:  Lab Results  Component Value Date   ETH <10 10/06/2020   ETH <10 09/15/2020    Metabolic Disorder Labs: Lab Results  Component Value Date   HGBA1C 6.0 (H) 10/08/2020   MPG 126 10/08/2020   MPG 131.24 09/11/2020   No results found for: PROLACTIN Lab Results  Component Value Date   CHOL 95 10/08/2020   TRIG 101 10/08/2020   HDL 26 (L) 10/08/2020   CHOLHDL 3.7 10/08/2020   VLDL 20 10/08/2020   LDLCALC 49 10/08/2020   LDLCALC 60 09/11/2020    Physical Findings: AIMS: Facial and Oral Movements Muscles of Facial Expression: None, normal Lips and Perioral Area: None, normal Jaw: None, normal Tongue: None, normal,Extremity Movements Upper (arms, wrists, hands, fingers): None, normal Lower (legs, knees, ankles, toes): None, normal, Trunk Movements Neck, shoulders, hips: None, normal, Overall Severity Severity of abnormal movements (highest score from questions above): None,  normal Incapacitation due to abnormal movements: None, normal Patient's awareness of abnormal movements (rate only patient's report): No Awareness, Dental Status Current problems with teeth and/or dentures?: No Does patient usually wear dentures?: No  CIWA:    COWS:     Musculoskeletal: Strength & Muscle Tone: within normal limits Gait & Station: normal Patient leans: N/A  Psychiatric Specialty Exam:  Presentation  General Appearance: Appropriate for Environment  Eye Contact:Good  Speech:Clear and Coherent  Speech Volume:Normal  Handedness:Right   Mood and Affect  Mood:Irritable  Affect:Congruent   Thought Process  Thought Processes:Coherent; Goal Directed  Descriptions of Associations:Intact  Orientation:Full (Time, Place and Person)  Thought Content:Logical  History of Schizophrenia/Schizoaffective disorder:No  Duration of Psychotic Symptoms:Less than six months  Hallucinations:No data recorded Ideas of Reference:None  Suicidal Thoughts:No data recorded Homicidal Thoughts:No data recorded  Sensorium  Memory:Immediate Fair; Recent Fair; Remote Fair  Judgment:Fair  Insight:Shallow   Executive Functions  Concentration:Fair  Attention Span:Fair  Recall:Fair  Fund of Knowledge:Fair  Language:Good   Psychomotor Activity  Psychomotor Activity: No data recorded  Assets  Assets:Communication Skills; Desire for Improvement; Social Support; Vocational/Educational   Sleep  Sleep: No data recorded   Physical Exam: Physical Exam Vitals and nursing note reviewed.  Constitutional:      Appearance: Normal appearance.  HENT:     Head: Normocephalic and atraumatic.  Pulmonary:     Effort: Pulmonary effort is normal.  Neurological:     General: No focal deficit present.     Mental Status: He is alert and oriented to person, place, and time.   Review of Systems  All other systems reviewed and are negative. Blood pressure 100/69, pulse  67, temperature 98 F (36.7 C), temperature source Oral, resp. rate 20, SpO2 100 %. There is no height or weight on file to calculate BMI.   Treatment Plan Summary: Daily contact with patient to assess and evaluate symptoms and progress in treatment, Medication management, and Plan patient is seen and examined.  Patient is a 30 year old male with the above-stated past psychiatric history who is seen in follow-up.  Diagnosis: 1.  Substance-induced mood disorder versus major depression versus bipolar disorder; depressed 2.  Unspecified anxiety.  Pertinent findings on examination today: 1.  No suicidal or homicidal ideation. 2.  Mood is euthymic. 3.  Patient is in agreement with discharge tomorrow to go to the [facility] for resources.  Plan: 1.  Continue albuterol 2 puffs every 4 hours as needed wheezing. 2.  Continue hydroxyzine 25 mg p.o. 3 times daily as needed anxiety. 3.  Continue Seroquel 100 mg p.o. nightly for mood stability and sleep. 4.  Continue Zoloft 50 mg p.o. nightly for anxiety and depression. 5.  Continue trazodone 25 mg p.o. nightly as needed insomnia. 6.  Disposition planning-in progress, plan discharge tomorrow morning.  Antonieta Pert, MD 10/13/2020, 1:47 PM

## 2020-10-14 DIAGNOSIS — F332 Major depressive disorder, recurrent severe without psychotic features: Secondary | ICD-10-CM

## 2020-10-14 MED ORDER — HYDROXYZINE HCL 25 MG PO TABS: 25.0000 mg | ORAL_TABLET | Freq: Three times a day (TID) | ORAL | 0 refills | Status: DC | PRN

## 2020-10-14 MED ORDER — SERTRALINE HCL 50 MG PO TABS: 50.0000 mg | ORAL_TABLET | Freq: Every day | ORAL | 0 refills | Status: DC

## 2020-10-14 MED ORDER — TRAZODONE HCL 50 MG PO TABS: 25.0000 mg | ORAL_TABLET | Freq: Every evening | ORAL | 0 refills | Status: DC | PRN

## 2020-10-14 MED ORDER — QUETIAPINE FUMARATE 100 MG PO TABS: 100.0000 mg | ORAL_TABLET | Freq: Every day | ORAL | 0 refills | Status: DC

## 2020-10-14 NOTE — [facility] Counselor (Signed)
CSW spoke with [facility] about possible discharge and discussed living options with him.  [name] stated that he wants to go to [facility] but will need an ID and Social Security Card to go.  He states he has these items at home but his girlfriend will not bring them to him.  He also states that he can get these items at the [facility] but does not want to "get stuck in [facility]" because he does not know the town and does not like being in [facility].  CSW stated that for any treatment center he will need an ID and Social Security Card.  CSW will continue to speak with [facility] about his options and help him choose one that will fit his needs.

## 2020-10-14 NOTE — Progress Notes (Signed)
D: Pt A & O X 4. Denies SI, HI, AVH and pain at this time. D/C home as ordered. Picked up in front of facility.  A: D/C instructions reviewed with pt including prescriptions, medication samples and follow up appointment; compliance encouraged. All belongings from locker 37 given to pt at time of departure. Scheduled medications given with verbal education and effects monitored. Safety checks maintained without incident till time of d/c.  R: Pt receptive to care. Compliant with medications when offered. Denies adverse drug reactions when assessed. Verbalized understanding related to d/c instructions. Signed belonging sheet in agreement with items received from locker. Ambulatory with a steady gait. Appears to be in no physical distress at time of departure.

## 2020-10-14 NOTE — [facility] Suicide Risk Assessment (Signed)
[facility] Discharge Suicide Risk Assessment   Principal Problem: MDD (major depressive disorder), recurrent severe, without psychosis ([name]) Discharge Diagnoses: Principal Problem:   MDD (major depressive disorder), recurrent severe, without psychosis ([name]) Active Problems:   Methamphetamine use disorder, moderate ([name])   Total Time spent with patient: 15 minutes  Musculoskeletal: Strength & Muscle Tone: within normal limits Gait & Station: normal Patient leans: N/A  Psychiatric Specialty Exam  Presentation  General Appearance: Appropriate for Environment  Eye Contact:Good  Speech:Clear and Coherent; Normal Rate  Speech Volume:Normal  Handedness:Right   Mood and Affect  Mood:Euthymic  Duration of Depression Symptoms: Greater than two weeks  Affect:Congruent; Full Range   Thought Process  Thought Processes:Coherent; Goal Directed; Linear  Descriptions of Associations:Intact  Orientation:Full (Time, Place and Person)  Thought Content:Logical  History of Schizophrenia/Schizoaffective disorder:No  Duration of Psychotic Symptoms:N/A  Hallucinations:Hallucinations: None  Ideas of Reference:None  Suicidal Thoughts:Suicidal Thoughts: No  Homicidal Thoughts:Homicidal Thoughts: No   Sensorium  Memory:Immediate Good; Recent Good; Remote Good  Judgment:Fair  Insight:Fair   Executive Functions  Concentration:Fair  Attention Span:Fair  Recall:Good  Fund of Knowledge:Fair  Language:Good   Psychomotor Activity  Psychomotor Activity:Psychomotor Activity: Normal   Assets  Assets:Communication Skills; Desire for Improvement; Social Support; Vocational/Educational   Sleep  Sleep:Sleep: Good Number of Hours of Sleep: 5.75   Physical Exam: Physical Exam Vitals and nursing note reviewed.  Constitutional:      General: He is not in acute distress.    Appearance: Normal appearance.  HENT:     Head: Normocephalic and atraumatic.  Pulmonary:      Effort: Pulmonary effort is normal.  Neurological:     General: No focal deficit present.     Mental Status: He is alert and oriented to person, place, and time.   Review of Systems  Constitutional: Negative.   HENT: Negative.    Respiratory: Negative.    Cardiovascular: Negative.   Gastrointestinal: Negative.   Musculoskeletal: Negative.   Neurological: Negative.   Psychiatric/Behavioral:  Negative for hallucinations and suicidal ideas. The patient does not have insomnia.   Blood pressure 100/69, pulse 67, temperature 98 F (36.7 C), temperature source Oral, resp. rate 20, SpO2 100 %. There is no height or weight on file to calculate BMI.  Mental Status Per Nursing Assessment::   On Admission:  Suicidal ideation indicated by patient, Self-harm thoughts  Demographic Factors:  Male and Low socioeconomic status  Loss Factors: Financial problems/change in socioeconomic status  Historical Factors: Family history of mental illness or substance abuse and Impulsivity  Risk Reduction Factors:   Responsible for children under 83 years of age, Sense of responsibility to family, Employed, and Positive social support  Continued Clinical Symptoms:  Depression - improved Alcohol/Substance Abuse/Dependencies Previous Psychiatric Diagnoses and Treatments  Cognitive Features That Contribute To Risk:  None    Suicide Risk:  Minimal: No identifiable suicidal ideation.  Patients presenting with no risk factors but with morbid ruminations; may be classified as minimal risk based on the severity of the depressive symptoms   Follow-up Information     [facility] Follow up.   Specialty: Addiction Medicine Why: Referral made Contact information: [address] [place] [address] [phone]         Services, [facility]. Go on 10/15/2020.   Why: You have a hospital follow up appointment for medication management services on 10/15/20 at 9:30 am.   This appointment will be held in person. Therapy is also available. *  PLEASE CANCEL IF YOU NO LONGER RESIDE IN [facility]. Contact information: [address] [place] [address] [phone]         [facility] Follow up.   Why: Please call the provider to see if you are eligible to return to the group home for residential substance use services, and if there is a current opening.  [facility] records will not be shared.) Contact information: [address] [place] [address] [phone]         [facility] Follow up.   Specialty: Behavioral Health Why: You may go to this provider for therapy and medication management services on a walk in basis.  Walk in hours are Monday through Wednesday from 8:00 am to 11:00 am.  You must live in [facility]. Contact information: [address] [place] [address] [phone]                Plan Of Care/Follow-up recommendations:  Activity:  As tolerated  Other:   -Take medications as prescribed.   -Do not drink alcohol.  Do not use marijuana or other drugs.   -Attend residential substance abuse treatment program and 12-step groups.   -Keep mental health outpatient follow-up appointments with therapist and psychiatrist.  -See primary care provider for treatment of medical conditions.  Claudie Revering, MD 10/14/2020, 9:39 AM

## 2020-10-14 NOTE — Discharge Summary (Addendum)
Physician Discharge Summary Note  Patient:  Nathan Koch is an 30 y.o., male MRN:  [id number] DOB:  Feb 10, 1991 Patient phone:  [phone] (home)  Patient address:   [address] [place] [address],  Total Time spent with patient: 30 minutes  Date of Admission:  10/07/2020 Date of Discharge: 0620/2022  Reason for Admission:  Nathan Koch is a 30 year old male who was admitted to the [facility] from [facility] ED, where he presented for suicidal ideation in the context of relapse of substance abuse and discord with his wife.   Principal Problem: MDD (major depressive disorder), recurrent severe, without psychosis ([name]) Discharge Diagnoses: Principal Problem:   MDD (major depressive disorder), recurrent severe, without psychosis ([name]) Active Problems:   Methamphetamine use disorder, moderate ([name])   Past Psychiatric History: Depression, anxiety, PTSD, methamphetamine abuse  Past Medical History:  Past Medical History:  Diagnosis Date   Anxiety    Anxiety disorder    Asthma    Bipolar 1 disorder ([name])    Depression    Methamphetamine abuse ([name])    PTSD (post-traumatic stress disorder)     Past Surgical History:  Procedure Laterality Date   CLOSED REDUCTION MANDIBLE N/A 07/02/2018   Procedure: CLOSED REDUCTION MANDIBULAR;  Surgeon: Vernie Murders, MD;  Location: [facility] ORS;  Service: ENT;  Laterality: N/A;   Family History:  Family History  Problem Relation Age of Onset   Asthma Mother    Cancer Mother        breast cancer   Ulcers Mother    Cancer Father        esophogeal cancer   Hypertension Father    Asthma Brother    Family Psychiatric  History: Unknown Social History:  Social History   Substance and Sexual Activity  Alcohol Use Yes   Alcohol/week: 13.0 standard drinks   Types: 6 Cans of beer, 7 Standard drinks or equivalent per week   Comment: previously heavy drinker 2016. most recent 4 beer/ day drinker and none since 01-11-2020     Social History    Substance and Sexual Activity  Drug Use Yes   Frequency: 7.0 times per week   Types: Methamphetamines, Marijuana   Comment: last used 09/15/20    Social History   Socioeconomic History   Marital status: Single    Spouse name: Not on file   Number of children: 1   Years of education: 10   Highest education level: 10th grade  Occupational History   Not on file  Tobacco Use   Smoking status: Every Day    Packs/day: 0.50    Years: 8.00    Pack years: 4.00    Types: Cigarettes   Smokeless tobacco: Never  Vaping Use   Vaping Use: Every day   Substances: Nicotine, Flavoring  Substance and Sexual Activity   Alcohol use: Yes    Alcohol/week: 13.0 standard drinks    Types: 6 Cans of beer, 7 Standard drinks or equivalent per week    Comment: previously heavy drinker 2016. most recent 4 beer/ day drinker and none since 01-11-2020   Drug use: Yes    Frequency: 7.0 times per week    Types: Methamphetamines, Marijuana    Comment: last used 09/15/20   Sexual activity: Not on file  Other Topics Concern   Not on file  Social History Narrative   Not on file   Social Determinants of Health   Financial Resource Strain: Not on file  Food Insecurity: Not on  file  Transportation Needs: Not on file  Physical Activity: Not on file  Stress: Not on file  Social Connections: Not on file    Hospital Course:   Evaluation On Admission: Nathan Koch is a 30 year old male with past psychiatric diagnoses of methamphetamine use disorder, substance-induced mood disorder, depression, bipolar disorder and PTSD who was admitted from [facility] after he presented there voluntarily reporting suicidal ideation with a plan to jump in front of traffic.  Patient also reported struggling with meth addiction and requested treatment.  He reported most recent meth use was on 10/05/2020.  In the ED patient reported stressors at home with his wife which had resulted him living on the streets for the last few days  and caused him to become more depressed.  UDS in the ED was positive for amphetamines.  BAL was negative.  He was observed overnight and [facility] for further treatment and stabilization.  [facility], the patient presents with constricted affect and he appears tired.  He states that he came to the hospital to seek help for his addiction and recently he had been prioritizing drug use over his work.  He reports daily use of methamphetamine (both IV use and smoking) of 1 g/day.  His last use was 2 days prior to the emergency department visit.  He denies use of other drugs or alcohol.  While using he reports that he slept less, ate less, lost weight and experienced auditory hallucinations and paranoia.  Since his last use he has been experiencing depressed mood, fatigue, decreased energy, hypersomnia, increased appetite, irritability, sadness and suicidal ideation.  Patient reports that he continues to experience suicidal thoughts today but denies thoughts of harming himself in the hospital.  He is concerned that if discharged he would harm himself.  He denies AI, HI, PI, AH or VH.  Patient is interested in residential substance abuse treatment program.  Patient states that he was taking his psychiatric medicines approximately every other day since he was last discharged from the hospital.  Medications include Zoloft 50 mg daily, Seroquel 100 mg at bedtime, gabapentin 200 mg 3 times daily.   The patient reports a history of approximately 5 prior inpatient psychiatric admissions typically for suicidal ideation associated with substance use and depression.  He denies any history of suicide attempts.  The patient's [facility] until 09/14/2020 for similar presentation.  He was treated with the above listed medications and referred to [facility] but did not follow-up.  After the above admission evaluation: The [facility]  medication management and treatment for his symptoms of depression, and suicidal ideation.  Symptoms on admission were consistent with major depressive disorder exacerbated by recent methamphetamine use.  To treat his symptoms, the patient was started on a combination of Zoloft and Seroquel which had worked well for him in the past.  Patient tolerated these medications well without significant side effects and with good effect. during patient's hospital course there were no instances of behavior that required restraints or immediate intervention. Patient remained safe on the unit. There were no instances of self-harming behavior. Patient remained cooperative, participated in group sessions and interacted with staff and patients appropriately. Seroquel 100 mg and Zoloft 50 mg were home medications that were continued during this hospitalization. Trazadone 50 mg was initiated at night for sleep, and hydroxyzine 25 mg as needed was given for anxiety. Over the course  of her hospitalization, the patient's symptoms of anxiety and depression have improved. He denies thoughts of self-harm and suicidal ideation. He expresses that he has learned better coping strategies to alleviate his symptoms. Patient expresses readiness for discharge and future-oriented thinking. Patient encouraged to keep all psychiatric appointments and continue with follow-up.      Physical Findings: AIMS: Facial and Oral Movements Muscles of Facial Expression: None, normal Lips and Perioral Area: None, normal Jaw: None, normal Tongue: None, normal,Extremity Movements Upper (arms, wrists, hands, fingers): None, normal Lower (legs, knees, ankles, toes): None, normal, Trunk Movements Neck, shoulders, hips: None, normal, Overall Severity Severity of abnormal movements (highest score from questions above): None, normal Incapacitation due to abnormal movements: None, normal Patient's awareness of abnormal movements (rate only patient's report):  No Awareness, Dental Status Current problems with teeth and/or dentures?: No Does patient usually wear dentures?: No  CIWA:    COWS:     Musculoskeletal: Strength & Muscle Tone: within normal limits Gait & Station: normal Patient leans: N/A   Psychiatric Specialty Exam: SEE MD'S DISCHARGE SRA      Physical Exam: Physical Exam Vitals and nursing note reviewed.  HENT:     Head: Normocephalic.     Nose: No congestion or rhinorrhea.  Eyes:     General:        Right eye: No discharge.        Left eye: No discharge.  Pulmonary:     Effort: Pulmonary effort is normal.  Musculoskeletal:        General: Normal range of motion.     Cervical back: Normal range of motion.  Neurological:     Mental Status: He is alert and oriented to person, place, and time.   Review of Systems  Psychiatric/Behavioral:  Negative for depression (hx of. Stable for discharge), hallucinations, memory loss, substance abuse and suicidal ideas. The patient is not nervous/anxious and does not have insomnia.   Blood pressure 100/69, pulse 67, temperature 98 F (36.7 C), temperature source Oral, resp. rate 20, SpO2 100 %. There is no height or weight on file to calculate BMI.   Social History   Tobacco Use  Smoking Status Every Day   Packs/day: 0.50   Years: 8.00   Pack years: 4.00   Types: Cigarettes  Smokeless Tobacco Never   Tobacco Cessation:  N/A, patient does not currently use tobacco products   Blood Alcohol level:  Lab Results  Component Value Date   ETH <10 10/28/2020   ETH <10 10/22/2020    Metabolic Disorder Labs:  Lab Results  Component Value Date   HGBA1C 6.0 (H) 10/08/2020   MPG 126 10/08/2020   MPG 131.24 09/11/2020   No results found for: PROLACTIN Lab Results  Component Value Date   CHOL 95 10/08/2020   TRIG 101 10/08/2020   HDL 26 (L) 10/08/2020   CHOLHDL 3.7 10/08/2020   VLDL 20 10/08/2020   LDLCALC 49 10/08/2020   LDLCALC 60 09/11/2020    See Psychiatric  Specialty Exam and Suicide Risk Assessment completed by Attending Physician prior to discharge.  Discharge destination:  Other:  [facility]  Is patient on multiple antipsychotic therapies at discharge:  No   Has Patient had three or more failed trials of antipsychotic monotherapy by history:  No  Recommended Plan for Multiple Antipsychotic Therapies: NA  Discharge Instructions     Increase activity slowly   Complete by: As directed       Allergies as of  10/14/2020   No Known Allergies      Medication List     STOP taking these medications    gabapentin 100 MG capsule Commonly known as: NEURONTIN       TAKE these medications      Indication  albuterol 108 (90 Base) MCG/ACT inhaler Commonly known as: VENTOLIN HFA Inhale 2 puffs into the lungs every 4 (four) hours as needed for wheezing or shortness of breath.  Indication: Spasm of Lung Air Passages   hydrOXYzine 25 MG tablet Commonly known as: ATARAX/VISTARIL Take 1 tablet (25 mg total) by mouth 3 (three) times daily as needed for anxiety.  Indication: Feeling Anxious   QUEtiapine 100 MG tablet Commonly known as: SEROQUEL Take 1 tablet (100 mg total) by mouth at bedtime. What changed: additional instructions  Indication: Major Depressive Disorder   sertraline 50 MG tablet Commonly known as: ZOLOFT Take 1 tablet (50 mg total) by mouth at bedtime. What changed: additional instructions  Indication: Major Depressive Disorder   traZODone 50 MG tablet Commonly known as: DESYREL Take 0.5 tablets (25 mg total) by mouth at bedtime as needed (may give at least 1 hour after bedtime Seroquel dose given if needed for sleep).  Indication: Major Depressive Disorder        Follow-up Information     [facility] Call.   Specialty: Addiction Medicine Why: A referral has been made on your behalf. Please call this facility daily to check for open beds. Contact information: [address] [place] [address] [phone]         [facility]. Go on 10/15/2020.   Why: You have a hospital follow up appointment for medication management services on 10/15/20 at 9:30 am.  This appointment will be held in person. Therapy is also available. *PLEASE CANCEL IF YOU NO LONGER RESIDE IN [facility]. Contact information: [address] [place] [address] [phone]         [facility] Follow up.   Why: Please call the provider to see if you are eligible to return to the group home for residential substance use services, and if there is a current opening.  [facility] records will not be shared.) Contact information: [address] [place] [address] [phone]         [facility] Follow up.   Specialty: Behavioral Health Why: You may go to this provider for therapy and medication management services on a walk in basis.  Walk in hours are Monday through Wednesday from 8:00 am to 11:00 am.  You must live in [facility]. Contact information: [address] [place] [address] [phone]        [facility] Call.   Why: Please call this agency to complete an intake and to arrange for an available bed on 10/17/20.  You will need to arrive there at 7:30am. Contact information: [address] [place] [address] [phone]                 Follow-up recommendations:  Follow up with outpatient provider for all your medical care needs. Activity as tolerated. Diet as recommended by your outpatient provider.   Comments:  Prescriptions sent electronically to [facility] in [place] at discharge per patient request. Patient agreeable to plan.  Given opportunity to ask questions.  Voices readiness for discharge and denies any current suicidal or homicidal thoughts, paranoia, auditory or visual hallucinations. Patient is also instructed  prior to discharge to: Take all  medications as prescribed until follow up appointment by outpatient provider. Report any adverse effects and or reactions from the medicines to outpatient provider promptly. Patient has been instructed & cautioned to not engage in alcohol and or illegal drug use while on prescription medicines. In the event of worsening symptoms, patient is instructed to call the mobile crisis hotline [phone], National Suicide prevention Lifeline (984)482-3139, 911 and/or go to the nearest ED for appropriate evaluation and treatment of symptoms.  Follow-up with his primary care provider for your other medical issues, concerns and or health care needs.   Signed: Claudie Revering, MD 11/07/2020, 6:17 PM

## 2020-10-14 NOTE — Progress Notes (Signed)
  [facility] Adult Case Management Discharge Plan :  Will you be returning to the same living situation after discharge:  Yes,  [facility]  At discharge, do you have transportation home?: Yes,  Safe Transport  Do you have the ability to pay for your medications: Yes,  Family and Community   Release of information consent forms completed and in the chart;  Patient's signature needed at discharge.  Patient to Follow up at:  Follow-up Information     [facility] Call.   Specialty: Addiction Medicine Why: A referral has been made on your behalf. Please call this facility daily to check for open beds. Contact information: [address] [place] [address] [phone]         [facility] Go on 10/15/2020.   Why: You have a hospital follow up appointment for medication management services on 10/15/20 at 9:30 am.  This appointment will be held in person. Therapy is also available. *PLEASE CANCEL IF YOU NO LONGER RESIDE IN [facility]. Contact information: [address] [place] [address] [phone]         [facility] Follow up.   Why: Please call the provider to see if you are eligible to return to the group home for residential substance use services, and if there is a current opening.  [facility] records will not be shared.) Contact information: [address] [place] [address] [phone]         [facility] Follow up.   Specialty: Behavioral Health Why: You may go to this provider for therapy and medication management services on a walk in basis.  Walk in hours are Monday through Wednesday from 8:00 am to 11:00 am.  [facility] must [facility] in [facility]. Contact information: [address] [place] [address] [phone]        [facility] Call.   Why: Please call this agency to complete an intake and to arrange for an available bed on 10/17/20.   You will need to arrive there at 7:30am. Contact information: [address] [place] [address] [phone]                 Next level of care provider has access to [facility] Link:yes  Safety Planning and Suicide Prevention discussed: Yes,  with patient and Fiance      Has patient been referred to the Quitline?: Patient refused referral  Patient has been referred for addiction treatment: Yes  Aram Beecham, LCSWA 10/14/2020, 11:10 AM

## 2020-10-14 NOTE — Tx Team (Signed)
Interdisciplinary Treatment and Diagnostic Plan Update  10/14/2020 Time of Session: 9:00am  Nathan Koch MRN: [id number]  Principal Diagnosis: MDD (major depressive disorder), recurrent severe, without psychosis ([name])  Secondary Diagnoses: Principal Problem:   MDD (major depressive disorder), recurrent severe, without psychosis ([name]) Active Problems:   Methamphetamine use disorder, moderate ([name])   Current Medications:  Current Facility-Administered Medications  Medication Dose Route Frequency Provider Last Rate Last Admin   acetaminophen (TYLENOL) tablet 650 mg  650 mg Oral Q6H PRN Novella Olive, NP       albuterol (VENTOLIN HFA) 108 (90 Base) MCG/ACT inhaler 2 puff  2 puff Inhalation Q4H PRN Claudie Revering, MD       alum & mag hydroxide-simeth (MAALOX/MYLANTA) 200-200-20 MG/5ML suspension 30 mL  30 mL Oral Q4H PRN Novella Olive, NP       hydrOXYzine (ATARAX/VISTARIL) tablet 25 mg  25 mg Oral TID PRN Novella Olive, NP   25 mg at 10/13/20 2103   magnesium hydroxide (MILK OF MAGNESIA) suspension 30 mL  30 mL Oral Daily PRN Novella Olive, NP       QUEtiapine (SEROQUEL) tablet 100 mg  100 mg Oral QHS Novella Olive, NP   100 mg at 10/13/20 2103   sertraline (ZOLOFT) tablet 50 mg  50 mg Oral QHS Novella Olive, NP   50 mg at 10/13/20 2103   traZODone (DESYREL) tablet 25 mg  25 mg Oral QHS PRN Claudie Revering, MD       PTA Medications: Medications Prior to Admission  Medication Sig Dispense Refill Last Dose   albuterol (VENTOLIN HFA) 108 (90 Base) MCG/ACT inhaler Inhale 2 puffs into the lungs every 4 (four) hours as needed for wheezing or shortness of breath.      gabapentin (NEURONTIN) 100 MG capsule Take 2 capsules (200 mg total) by mouth 3 (three) times daily. For agitation 180 capsule 0    QUEtiapine (SEROQUEL) 100 MG tablet Take 1 tablet (100 mg total) by mouth at bedtime. For mood control 30 tablet 0    sertraline (ZOLOFT) 50 MG tablet Take 1 tablet (50 mg total) by mouth at  bedtime. For depression (Patient not taking: Reported on 10/06/2020) 30 tablet 0     Patient Stressors:    Patient Strengths:    Treatment Modalities: Medication Management, Group therapy, Case management,  1 to 1 session with clinician, Psychoeducation, Recreational therapy.   Physician Treatment Plan for Primary Diagnosis: MDD (major depressive disorder), recurrent severe, without psychosis ([name]) Long Term Goal(s): Improvement in symptoms so as ready for discharge   Short Term Goals: Ability to identify changes in lifestyle to reduce recurrence of condition will improve Ability to verbalize feelings will improve Ability to disclose and discuss suicidal ideas Ability to demonstrate self-control will improve Ability to identify and develop effective coping behaviors will improve Compliance with prescribed medications will improve Ability to identify triggers associated with substance abuse/mental health issues will improve  Medication Management: Evaluate patient's response, side effects, and tolerance of medication regimen.  Therapeutic Interventions: 1 to 1 sessions, Unit Group sessions and Medication administration.  Evaluation of Outcomes: Adequate for Discharge  Physician Treatment Plan for Secondary Diagnosis: Principal Problem:   MDD (major depressive disorder), recurrent severe, without psychosis ([name]) Active Problems:   Methamphetamine use disorder, moderate ([name])  Long Term Goal(s): Improvement in symptoms so as ready for discharge   Short Term Goals: Ability to identify changes in lifestyle to reduce recurrence of condition  will improve Ability to verbalize feelings will improve Ability to disclose and discuss suicidal ideas Ability to demonstrate self-control will improve Ability to identify and develop effective coping behaviors will improve Compliance with prescribed medications will improve Ability to identify triggers associated with substance abuse/mental  health issues will improve     Medication Management: Evaluate patient's response, side effects, and tolerance of medication regimen.  Therapeutic Interventions: 1 to 1 sessions, Unit Group sessions and Medication administration.  Evaluation of Outcomes: Adequate for Discharge   RN Treatment Plan for Primary Diagnosis: MDD (major depressive disorder), recurrent severe, without psychosis ([name]) Long Term Goal(s): Knowledge of disease and therapeutic regimen to maintain health will improve  Short Term Goals: Ability to remain free from injury will improve, Ability to participate in decision making will improve, Ability to verbalize feelings will improve, Ability to disclose and discuss suicidal ideas, and Ability to identify and develop effective coping behaviors will improve  Medication Management: RN will administer medications as ordered by provider, will assess and evaluate patient's response and provide education to patient for prescribed medication. RN will report any adverse and/or side effects to prescribing provider.  Therapeutic Interventions: 1 on 1 counseling sessions, Psychoeducation, Medication administration, Evaluate responses to treatment, Monitor vital signs and CBGs as ordered, Perform/monitor CIWA, COWS, AIMS and Fall Risk screenings as ordered, Perform wound care treatments as ordered.  Evaluation of Outcomes: Adequate for Discharge   LCSW Treatment Plan for Primary Diagnosis: MDD (major depressive disorder), recurrent severe, without psychosis ([name]) Long Term Goal(s): Safe transition to appropriate next level of care at discharge, Engage patient in therapeutic group addressing interpersonal concerns.  Short Term Goals: Engage patient in aftercare planning with referrals and resources, Increase social support, Facilitate acceptance of mental health diagnosis and concerns, Facilitate patient progression through stages of change regarding substance use diagnoses and concerns,  Identify triggers associated with mental health/substance abuse issues, and Increase skills for wellness and recovery  Therapeutic Interventions: Assess for all discharge needs, 1 to 1 time with Social worker, Explore available resources and support systems, Assess for adequacy in community support network, Educate family and significant other(s) on suicide prevention, Complete Psychosocial Assessment, Interpersonal group therapy.  Evaluation of Outcomes: Adequate for Discharge   Progress in Treatment: Attending groups: Yes. Participating in groups: Yes. Taking medication as prescribed: Yes. Toleration medication: Yes. Family/Significant other contact made: Yes, individual(s) contacted:  Fiance  Patient understands diagnosis: No. Discussing patient identified problems/goals with staff: Yes. Medical problems stabilized or resolved: Yes. Denies suicidal/homicidal ideation: Yes. Issues/concerns per patient self-inventory: No.   New problem(s) identified: No, Describe:  None   New Short Term/Long Term Goal(s): medication stabilization, elimination of SI thoughts, development of comprehensive mental wellness plan.   Patient Goals: "To get into a long-term substance use treatment center"  Discharge Plan or Barriers: Patient recently admitted. CSW will continue to follow and assess for appropriate referrals and possible discharge planning.   Reason for Continuation of Hospitalization: Depression Medication stabilization Suicidal ideation Withdrawal symptoms  Estimated Length of Stay: 3 to 5 days   Attendees: Patient: Nathan Koch 10/14/2020   Physician: Landry Mellow, MD 10/14/2020   Nursing:  10/14/2020   RN Care Manager: 10/14/2020   Social Worker: Melba Coon, LCSWA 10/14/2020   Recreational Therapist:  10/14/2020   Other:  10/14/2020   Other:  10/14/2020   Other: 10/14/2020     Scribe for Treatment Team: Felizardo Hoffmann, Theresia Majors 10/14/2020 10:42 AM

## 2020-10-14 NOTE — Progress Notes (Signed)
Recreation Therapy Notes  Date:  6.20.22 Time: 0930 Location: [address] Dayroom   Group Topic: Stress Management   Goal Area(s) Addresses: Patient will identify positive stress management techniques. Patient will identify benefits of using stress management post d/c.   Behavioral Response: Attentive   Intervention: Stress Management   Activity :  Meditation.  LRT played a meditation that focused on taking note of any tension that may be in the body or any sensations that may be felt such as cool, warmth, tingles or any slight sensations on the body.   Education:  Stress Management, Discharge Planning.   Education Outcome: Acknowledges Education   Clinical Observations/Feedback: Pt attended and participated.  Pt had no questions and expressed no concerns.       Caroll Rancher, LRT/CTRS         Caroll Rancher A 10/14/2020 12:37 PM

## 2020-10-14 NOTE — BHH Group Notes (Signed)
ADULT GRIEF GROUP NOTE:   Spiritual care group on grief and loss facilitated by chaplain Dyanne Carrel, [facility]   Group Goal:   Support / Education around grief and loss   Members engage in facilitated group support and psycho-social education.   Group Description:   Following introductions and group rules, group members engaged in facilitated group dialog and support around topic of loss, with particular support around experiences of loss in their lives. Group Identified types of loss (relationships / self / things) and identified patterns, circumstances, and changes that precipitate losses. Reflected on thoughts / feelings around loss, normalized grief responses, and recognized variety in grief experience. Group noted Worden's four tasks of grief in discussion.   Group drew on Adlerian / Rogerian, narrative, MI,   Patient Progress: Patient came in partway through the group.  He was engaged in the conversation, though he did not share much verbally.  He showed signs of resonating with things others were sharing and he was an attentive listener.    Chaplain Dyanne Carrel, Bcc Pager, [phone] 12:34 PM

## 2020-10-14 NOTE — BHH Counselor (Signed)
Nathan Koch states that he wants to be discharged to [facility] [place] to work on getting his ID and Social Security Card from his home.  He states that he can receive help from the [facility] [employer] for shelter and his girlfriend is going to help him with money and other items.

## 2020-10-14 NOTE — Progress Notes (Signed)
  [facility] Adult Case Management Discharge Plan :  Will you be returning to the same living situation after discharge:  No. [facility] in [place] At discharge, do you have transportation home?: Yes,  Safe Transport  Do you have the ability to pay for your medications: Yes,  Family and Community   Release of information consent forms completed and in the chart;  Patient's signature needed at discharge.  Patient to Follow up at:  Follow-up Information     [facility] Call.   Specialty: Addiction Medicine Why: A referral has been made on your behalf. Please call this facility daily to check for open beds. Contact information: [address] [place] [address] [phone]         [facility]. Go on 10/15/2020.   Why: You have a hospital follow up appointment for medication management services on 10/15/20 at 9:30 am.  This appointment will be held in person. Therapy is also available. *PLEASE CANCEL IF YOU NO LONGER RESIDE IN [facility] Contact information: [address] [place] [address] [phone]         [facility] Follow up.   Why: Please call the provider to see if you are eligible to return to the group home for residential substance use services, and if there is a current opening.  [facility] records will not be shared.) Contact information: [address] [place] [address] [phone]         [facility] Follow up.   Specialty: Behavioral Health Why: You may go to this provider for therapy and medication management services on a walk in basis.  Walk in hours are Monday through Wednesday from 8:00 am to 11:00 am.  You must live in [facility]. Contact information: [address] [place] [address] [phone]                Next level of care provider has access to [facility] Link:yes  Safety Planning and Suicide Prevention discussed: Yes,   with patient and Fiance      Has patient been referred to the Quitline?: Patient refused referral  Patient has been referred for addiction treatment: Yes  Aram Beecham, LCSWA 10/14/2020, 10:25 AM

## 2020-10-22 ENCOUNTER — Other Ambulatory Visit: Payer: Self-pay

## 2020-10-22 ENCOUNTER — Emergency Department (HOSPITAL_COMMUNITY)
Admission: EM | Admit: 2020-10-22 | Discharge: 2020-10-25 | Disposition: A | Discharge status: Home / Self Care | Payer: Self-pay | Attending: Emergency Medicine | Admitting: Emergency Medicine

## 2020-10-22 ENCOUNTER — Encounter (HOSPITAL_COMMUNITY): Payer: Self-pay | Admitting: *Deleted

## 2020-10-22 DIAGNOSIS — F1510 Other stimulant abuse, uncomplicated: Secondary | ICD-10-CM

## 2020-10-22 DIAGNOSIS — F12259 Cannabis dependence with psychotic disorder, unspecified: Secondary | ICD-10-CM | POA: Insufficient documentation

## 2020-10-22 DIAGNOSIS — F332 Major depressive disorder, recurrent severe without psychotic features: Secondary | ICD-10-CM | POA: Diagnosis present

## 2020-10-22 DIAGNOSIS — R45851 Suicidal ideations: Secondary | ICD-10-CM

## 2020-10-22 DIAGNOSIS — F1520 Other stimulant dependence, uncomplicated: Secondary | ICD-10-CM | POA: Diagnosis present

## 2020-10-22 LAB — CBC WITH DIFFERENTIAL/PLATELET
Abs Immature Granulocytes: 0.01 10*3/uL (ref 0.00–0.07)
Basophils Absolute: 0 10*3/uL (ref 0.0–0.1)
Basophils Relative: 0 %
Eosinophils Absolute: 0.3 10*3/uL (ref 0.0–0.5)
Eosinophils Relative: 3 %
HCT: 43.3 % (ref 39.0–52.0)
Hemoglobin: 14.2 g/dL (ref 13.0–17.0)
Immature Granulocytes: 0 %
Lymphocytes Relative: 26 %
Lymphs Abs: 1.9 10*3/uL (ref 0.7–4.0)
MCH: 28.5 pg (ref 26.0–34.0)
MCHC: 32.8 g/dL (ref 30.0–36.0)
MCV: 86.9 fL (ref 80.0–100.0)
Monocytes Absolute: 0.4 10*3/uL (ref 0.1–1.0)
Monocytes Relative: 5 %
Neutro Abs: 4.8 10*3/uL (ref 1.7–7.7)
Neutrophils Relative %: 66 %
Platelets: 283 10*3/uL (ref 150–400)
RBC: 4.98 MIL/uL (ref 4.22–5.81)
RDW: 13.4 % (ref 11.5–15.5)
WBC: 7.3 10*3/uL (ref 4.0–10.5)
nRBC: 0 % (ref 0.0–0.2)

## 2020-10-22 LAB — ACETAMINOPHEN LEVEL: Acetaminophen (Tylenol), Serum: <10 ug/mL — ABNORMAL LOW (ref 10–30)

## 2020-10-22 LAB — URINALYSIS, ROUTINE W REFLEX MICROSCOPIC
Bacteria, UA: NONE SEEN
Bilirubin Urine: NEGATIVE
Glucose, UA: NEGATIVE mg/dL
Ketones, ur: NEGATIVE mg/dL
Leukocytes,Ua: NEGATIVE
Nitrite: NEGATIVE
Protein, ur: NEGATIVE mg/dL
Specific Gravity, Urine: 1.015 (ref 1.005–1.030)
pH: 6 (ref 5.0–8.0)

## 2020-10-22 LAB — RAPID URINE DRUG SCREEN, HOSP PERFORMED
Amphetamines: POSITIVE — AB
Barbiturates: NOT DETECTED
Benzodiazepines: NOT DETECTED
Cocaine: NOT DETECTED
Opiates: NOT DETECTED
Tetrahydrocannabinol: NOT DETECTED

## 2020-10-22 LAB — COMPREHENSIVE METABOLIC PANEL
ALT: 29 U/L (ref 0–44)
AST: 19 U/L (ref 15–41)
Albumin: 3.7 g/dL (ref 3.5–5.0)
Alkaline Phosphatase: 72 U/L (ref 38–126)
Anion gap: 6 (ref 5–15)
BUN: 12 mg/dL (ref 6–20)
CO2: 29 mmol/L (ref 22–32)
Calcium: 8.7 mg/dL — ABNORMAL LOW (ref 8.9–10.3)
Chloride: 100 mmol/L (ref 98–111)
Creatinine, Ser: 0.79 mg/dL (ref 0.61–1.24)
GFR, Estimated: >60 mL/min (ref >60)
Glucose, Bld: 144 mg/dL — ABNORMAL HIGH (ref 70–99)
Potassium: 3.9 mmol/L (ref 3.5–5.1)
Sodium: 135 mmol/L (ref 135–145)
Total Bilirubin: 1.1 mg/dL (ref 0.3–1.2)
Total Protein: 7.2 g/dL (ref 6.5–8.1)

## 2020-10-22 LAB — ETHANOL: Alcohol, Ethyl (B): <10 mg/dL (ref <10)

## 2020-10-22 LAB — SALICYLATE LEVEL: Salicylate Lvl: <7 mg/dL — ABNORMAL LOW (ref 7.0–30.0)

## 2020-10-22 MED ORDER — SERTRALINE HCL 50 MG PO TABS
50.0000 mg | ORAL_TABLET | Freq: Every day | ORAL | Status: DC
  Administered 2020-10-22 – 2020-10-24 (×3): 50 mg via ORAL
  Filled 2020-10-22 (×3): qty 1

## 2020-10-22 MED ORDER — QUETIAPINE FUMARATE 100 MG PO TABS
100.0000 mg | ORAL_TABLET | Freq: Every day | ORAL | Status: DC
  Administered 2020-10-22 – 2020-10-24 (×3): 100 mg via ORAL
  Filled 2020-10-22 (×3): qty 1

## 2020-10-22 MED ORDER — HYDROXYZINE HCL 25 MG PO TABS: 25.0000 mg | ORAL_TABLET | Freq: Three times a day (TID) | ORAL | Status: DC | PRN

## 2020-10-22 MED ORDER — ALBUTEROL SULFATE HFA 108 (90 BASE) MCG/ACT IN AERS: 2.0000 | INHALATION_SPRAY | RESPIRATORY_TRACT | Status: DC | PRN

## 2020-10-22 MED ORDER — TRAZODONE HCL 50 MG PO TABS
25.0000 mg | ORAL_TABLET | Freq: Every evening | ORAL | Status: DC | PRN
  Administered 2020-10-22 – 2020-10-24 (×3): 25 mg via ORAL
  Filled 2020-10-22 (×3): qty 1
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
## 2020-10-22 NOTE — ED Triage Notes (Signed)
States the last few time he has been here he doesn't feel like he received any help and that is why he returns

## 2020-10-22 NOTE — ED Notes (Signed)
Pt dressed out and wanded by security.  

## 2020-10-22 NOTE — ED Triage Notes (Signed)
States he is suicidal, plans to overdose. Feeling suicidal x 3 days

## 2020-10-22 NOTE — ED Notes (Signed)
Pt received lunch 

## 2020-10-22 NOTE — ED Notes (Addendum)
All possession placed in bags in locker room locked up. Pt dressed out in scrubs.

## 2020-10-23 DIAGNOSIS — R45851 Suicidal ideations: Secondary | ICD-10-CM

## 2020-10-23 NOTE — ED Notes (Signed)
Patient used the phone at this time.

## 2020-10-23 NOTE — ED Notes (Signed)
Pt awake. Per request drink given. Pt adjusted in bed and blankets straightened. Pt calm and corroperative will continue top monitor

## 2020-10-23 NOTE — Progress Notes (Signed)
Patient has been faxed out due to no bed availability at [facility]. Patient meets inpatient criteria per Earleen Reaper. Patient referred to the following facilities:  [facility]  [address] [place] [address] [phone]  [facility]  [address] [place] [address] [phone]  [facility]  [address], [place] [address] [phone]  [facility]  [address] [place] [address] [phone]  [facility]  [address] [place] [address] [phone]  [facility]  [address] [place] [address] [phone]  [facility]  [address] [place] [address] [phone]  [facility]  [address] [place], [place] [address] [phone]  [name]  [address] [place], [place] [address] [phone]  [facility]  [address] [place] [address] [phone]  [facility]  [address], [place] [address] [phone]  [facility]  [address] [place] [address] [phone]    CSW will continue to monitor disposition.    Damita Dunnings, MSW, LCSW-A  2:38 PM 10/23/2020

## 2020-10-23 NOTE — ED Provider Notes (Signed)
Emergency Medicine Observation Re-evaluation Note  Nathan Koch is a 30 y.o. male, seen on rounds today.  Pt initially presented to the ED for complaints of V70.1 Currently, the patient is resting comfortably, awaiting TTS evaluation  Physical Exam  BP 124/74   Pulse 64   Temp 98.5 F (36.9 C) (Oral)   Resp 16   SpO2 100%  Physical Exam General: NAD Cardiac: Regular heart rate Lungs: No respiratory distress Psych: Stable  ED Course / MDM  EKG:   I have reviewed the labs performed to date as well as medications administered while in observation.    Plan  Current plan is for TTS evaluation.  Patient is medically cleared. Patient is not under full IVC at this time.   Terald Sleeper, MD 10/23/20 [phone]

## 2020-10-23 NOTE — ED Notes (Signed)
Pt breakfast is at bedside °

## 2020-10-23 NOTE — ED Notes (Signed)
Patient resting in bed with sheet over head. Respirations even and unlabored. NAD noted.

## 2020-10-23 NOTE — ED Notes (Signed)
Patient being telepsyched at this time in family waiting. Sitter in view
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
## 2020-10-24 NOTE — Progress Notes (Signed)
Patient has been faxed out due to no bed availability at [facility]. Patient meets inpatient criteria per [facility]. Patient referred to the following facilities:  [facility]  [address] [place] [address] [phone]  [facility]  [address] [place] [address] [phone]  [facility]  [address] [name] [address] [phone]  [facility]  [address], [place] [address] [phone]  [facility]  [address], [place] [address] [phone]  [facility]  [address] [place] [address] [phone]  [facility]  [address] [place] [address] [phone]  [facility]  [address] [place], [place] [address] [phone]  [facility]  [address] [place], [place] [address] [phone]  [facility]  [address] [place] [address] [phone]  [facility]  [address] [place] [address] [phone]  [facility]  [address], [place] [address] [phone]    CSW will continue to monitor disposition.     Damita Dunnings, MSW, LCSW-A  9:35 AM 10/24/2020

## 2020-10-24 NOTE — ED Notes (Signed)
Patient assessed by TTS.

## 2020-10-24 NOTE — BH Assessment (Signed)
TTS reassessment: Patient presents alert and oriented x 4. He is pleasant during re-assessment. He states, "I'm feeling about the same. I've been struggling with drug addiction. I relapsed after 9 months sober. I just feel like I can't get any help." Patient continues to endorse SI. He denies HI/AVH.   Patient continues to meet in patient care criteria.

## 2020-10-24 NOTE — ED Provider Notes (Signed)
Emergency Medicine Observation Re-evaluation Note  Nathan Koch is a 30 y.o. male, seen on rounds today.  Pt initially presented to the ED for complaints of V70.1 Currently, the patient is awaiting inpatient psych placement.  Physical Exam  BP 110/69   Pulse 62   Temp 98.3 F (36.8 C) (Oral)   Resp 16   Ht 5\' 9"  (1.753 m)   Wt 97 kg   SpO2 96%   BMI 31.58 kg/m  Physical Exam General: Calm and cooperative.  Cardiac: Well perfused.  Lungs: Even, unlabored respirations.  Psych: Calm and cooperative.   ED Course / MDM  EKG:   I have reviewed the labs performed to date as well as medications administered while in observation.  Recent changes in the last 24 hours include TTS evaluation and recommendation for inpatient placement with active SI and patient being unable to contract for safety.   Plan  Current plan is for inpatient psych placement. Patient is not under full IVC at this time.   , MD 10/24/20 1538

## 2020-10-24 NOTE — ED Notes (Signed)
Pt dinner is at bedside

## 2020-10-25 DIAGNOSIS — F4310 Post-traumatic stress disorder, unspecified: Secondary | ICD-10-CM

## 2020-10-25 DIAGNOSIS — F1520 Other stimulant dependence, uncomplicated: Secondary | ICD-10-CM

## 2020-10-25 DIAGNOSIS — F332 Major depressive disorder, recurrent severe without psychotic features: Secondary | ICD-10-CM

## 2020-10-25 DIAGNOSIS — R45851 Suicidal ideations: Secondary | ICD-10-CM

## 2020-10-25 NOTE — ED Notes (Signed)
PT requesting to leave. MD made aware. MD at bedside to talk with patient.

## 2020-10-25 NOTE — Discharge Instructions (Addendum)
You have been cleared for discharge home by behavioral health.  Follow-up with your counselors.  Return for any new or worse symptoms.  Return for any recurrent suicidal thoughts.

## 2020-10-25 NOTE — ED Provider Notes (Addendum)
Patient was expressing interest in going home.  He feels as if he is not suicidal anymore.  He wants to talk to behavioral health again.  Informed him that we need to get behavioral health to clear him since they were planning on inpatient admission.  Patient is willing to stay for reinterview.  He knows if he tries to leave we will IVC him.  He states that that is not necessary.   Vanetta Mulders, MD 10/25/20 1224 Patient reinterviewed by behavioral health no longer suicidal they have cleared him for discharge home.  Outpatient follow-up.   Vanetta Mulders, MD 10/25/20 1400

## 2020-10-25 NOTE — Progress Notes (Signed)
Patient has been faxed out due to no bed availability at [facility]. Patient meets inpatient criteria per Earleen Reaper. Patient referred to the following facilities:  [facility]  [address] [place] [address] [phone]  [facility]  [address] [place] [address] [phone]  [facility]  [address] [place] [address] [phone]  [facility]  [address], [place] [address] [phone]  [facility]  [address], [place] [address] [phone]  [facility]  [address] [place] [address] [phone]  [facility]  [address] [place] [address] [phone]  [facility]  [address] [place], [place] [address] [phone]  [facility]  [address] [place], [place] [address] [phone]  [facility]  [address] [place] [address] [phone]  [facility]  [address], [place] [address] [phone]  [facility]  [address], [place] [address] [phone]     CSW will continue to monitor disposition.     Damita Dunnings, MSW, LCSW-A  8:23 AM 10/25/2020

## 2020-10-25 NOTE — ED Notes (Signed)
TTS at this time. 

## 2020-10-25 NOTE — ED Notes (Signed)
MD spoke to pt. Explained to pt that he must have a re-eval with [facility] d/t to the patient showing signs of SI on last TTS. Pt verbalized understanding and agreed to plan of care. TTS consult placed.

## 2020-10-25 NOTE — Consult Note (Signed)
Telepsych Consultation   Reason for Consult: Psychiatry reassessment Referring Physician:   Location of [facility]: [facility] Location of Provider: Behavioral Health TTS Department  Patient Identification: Nathan Koch MRN:  [id number] Principal Diagnosis: Suicidal ideation Diagnosis:  Principal Problem:   Suicidal ideation Active Problems:   Methamphetamine use disorder, moderate ([name])   MDD (major depressive disorder), recurrent episode, severe ([name])   Total Time spent with patient: 30 minutes  Subjective:   Nathan Koch is a 30 y.o. male patient.  Patient states "I have been laying here for 3 days, I have had time to collect my thoughts and I talked to my wife and my sponsor for NA (Narcotics Anonymous.)  I am building my network back up.  I was ashamed to reach out because I had relapsed but I am glad I did it."  HPI:   Patient is reassessed by nurse practitioner.  He is alert and oriented, pleasant and cooperative during assessment.  He answers appropriately.  He is seated on stretcher in hospital room during assessment.  Patient reports recent stressors include "I relapsed on methamphetamine, after 9 months sober, 2 weeks ago."  Reports last use of methamphetamine approximately 1 week ago.  He reports readiness to stop using this substance.  Patient is insightful regarding mental health as well as substance use disorder.   He reports prior to arrival at the emergency department he felt suicidal for approximately 30 days, he endorses a fleeting plan to jump out into traffic.  He denies history of suicide attempts.  He states "I had no real attempts."  He endorses history of self-harm by cutting, reports last episode of cutting 1 month ago.  He denies suicidal and homicidal ideations today, he contracts verbally for safety with this Clinical research associate.  He denies auditory and visual hallucinations.  There is no evidence of delusional thought content he denies symptoms of  paranoia.  Jeris reports he has been diagnosed with PTSD, depression and anxiety.  He is followed outpatient by [facility].  He reports psychiatrist, Dr.Lay, prescribes Seroquel 100 mg nightly.  He reports compliance with his medication.  He reports plan to follow-up with established outpatient psychiatry at [facility].  He resides in [place] with his wife and 12-month-old daughter.  He denies access to weapons.  He reports he is employed but has recently not been working states "my boss understands my situation and is holding my job for me."  He is typically employed in [employer].  He endorses average sleep and appetite.  He denies substance use aside from methamphetamine.  He denies alcohol use.  Patient offered support and encouragement.  He gives verbal consent to speak with his wife, Marland Mcalpine phone number [phone].  Spoke with patient's wife who denies concern for patient safety.  She confirms no access to weapons to her knowledge, also reports she is aware that Verne is engaged with NA sponsor.    Past Psychiatric History: Suicidal ideation, methamphetamine use disorder, major depressive disorder, recurrent severe  Risk to Self: Denies Risk to Others: Denies Prior Inpatient Therapy:   Prior Outpatient Therapy:   [facility] in [place]  Past Medical History:  Past Medical History:  Diagnosis Date   Anxiety    Anxiety disorder    Asthma    Bipolar 1 disorder ([name])    Depression    Methamphetamine abuse ([name])    PTSD (post-traumatic stress disorder)     Past Surgical History:  Procedure Laterality Date  CLOSED REDUCTION MANDIBLE N/A 07/02/2018   Procedure: CLOSED REDUCTION MANDIBULAR;  Surgeon: Vernie Murders, MD;  Location: [facility] ORS;  Service: ENT;  Laterality: N/A;   Family History:  Family History  Problem Relation Age of Onset   Asthma Mother    Cancer Mother        breast cancer   Ulcers Mother    Cancer Father        esophogeal cancer    Hypertension Father    Asthma Brother    Family Psychiatric  History: None reported Social History:  Social History   Substance and Sexual Activity  Alcohol Use Yes   Alcohol/week: 13.0 standard drinks   Types: 6 Cans of beer, 7 Standard drinks or equivalent per week   Comment: previously heavy drinker 2016. most recent 4 beer/ day drinker and none since 01-11-2020     Social History   Substance and Sexual Activity  Drug Use Yes   Frequency: 7.0 times per week   Types: Methamphetamines, Marijuana   Comment: last used 09/15/20    Social History   Socioeconomic History   Marital status: Single    Spouse name: Not on file   Number of children: 1   Years of education: 10   Highest education level: 10th grade  Occupational History   Not on file  Tobacco Use   Smoking status: Every Day    Packs/day: 0.50    Years: 8.00    Pack years: 4.00    Types: Cigarettes   Smokeless tobacco: Never  Vaping Use   Vaping Use: Every day   Substances: Nicotine, Flavoring  Substance and Sexual Activity   Alcohol use: Yes    Alcohol/week: 13.0 standard drinks    Types: 6 Cans of beer, 7 Standard drinks or equivalent per week    Comment: previously heavy drinker 2016. most recent 4 beer/ day drinker and none since 01-11-2020   Drug use: Yes    Frequency: 7.0 times per week    Types: Methamphetamines, Marijuana    Comment: last used 09/15/20   Sexual activity: Not on file  Other Topics Concern   Not on file  Social History Narrative   Not on file   Social Determinants of Health   Financial Resource Strain: Not on file  Food Insecurity: Not on file  Transportation Needs: Not on file  Physical Activity: Not on file  Stress: Not on file  Social Connections: Not on file   Additional Social History:    Allergies:  No Known Allergies  Labs: No results found for this or any previous visit (from the past 48 hour(s)).  Medications:  Current Facility-Administered Medications   Medication Dose Route Frequency Provider Last Rate Last Admin   albuterol (VENTOLIN HFA) 108 (90 Base) MCG/ACT inhaler 2 puff  2 puff Inhalation Q4H PRN Cristina Gong, PA-C       hydrOXYzine (ATARAX/VISTARIL) tablet 25 mg  25 mg Oral TID PRN Cristina Gong, PA-C       QUEtiapine (SEROQUEL) tablet 100 mg  100 mg Oral QHS Lyndel Safe W, PA-C   100 mg at 10/24/20 2111   sertraline (ZOLOFT) tablet 50 mg  50 mg Oral QHS Cristina Gong, [place]   50 mg at 10/24/20 2111   traZODone (DESYREL) tablet 25 mg  25 mg Oral QHS PRN Cristina Gong, PA-C   25 mg at 10/24/20 2111   Current Outpatient Medications  Medication Sig Dispense Refill   albuterol (VENTOLIN HFA)  108 (90 Base) MCG/ACT inhaler Inhale 2 puffs into the lungs every 4 (four) hours as needed for wheezing or shortness of breath.     QUEtiapine (SEROQUEL) 100 MG tablet Take 1 tablet (100 mg total) by mouth at bedtime. 30 tablet 0   hydrOXYzine (ATARAX/VISTARIL) 25 MG tablet Take 1 tablet (25 mg total) by mouth 3 (three) times daily as needed for anxiety. (Patient not taking: No sig reported) 30 tablet 0   sertraline (ZOLOFT) 50 MG tablet Take 1 tablet (50 mg total) by mouth at bedtime. (Patient not taking: No sig reported) 30 tablet 0   traZODone (DESYREL) 50 MG tablet Take 0.5 tablets (25 mg total) by mouth at bedtime as needed (may give at least 1 hour after bedtime Seroquel dose given if needed for sleep). (Patient not taking: No sig reported) 30 tablet 0    Musculoskeletal: Strength & Muscle Tone: within normal limits Gait & Station: normal Patient leans: Right          Psychiatric Specialty Exam:  Presentation  General Appearance: Appropriate for Environment  Eye Contact:Fair  Speech:Clear and Coherent  Speech Volume:Normal  Handedness:Right   Mood and Affect  Mood:Depressed; Irritable; Hopeless  Affect:Flat   Thought Process  Thought Processes:Coherent; Goal Directed  Descriptions  of Associations:Intact  Orientation:Full (Time, Place and Person)  Thought Content:Logical  History of Schizophrenia/Schizoaffective disorder:No  Duration of Psychotic Symptoms:N/A  Hallucinations:No data recorded Ideas of Reference:None  Suicidal Thoughts:No data recorded Homicidal Thoughts:No data recorded  Sensorium  Memory:Immediate Good; Recent Good; Remote Good  Judgment:Fair  Insight:Fair   Executive Functions  Concentration:Fair  Attention Span:Fair  Recall:Good  Fund of Knowledge:Good  Language:Good   Psychomotor Activity  Psychomotor Activity: No data recorded  Assets  Assets:Communication Skills; Desire for Improvement; Housing; Intimacy; Physical Health; Social Support   Sleep  Sleep: No data recorded   Physical Exam: Physical Exam Vitals and nursing note reviewed.  Constitutional:      Appearance: Normal appearance. He is normal weight.  HENT:     Head: Normocephalic and atraumatic.     Nose: Nose normal.  Cardiovascular:     Rate and Rhythm: Normal rate.  Pulmonary:     Effort: Pulmonary effort is normal.  Musculoskeletal:        General: Normal range of motion.     Cervical back: Normal range of motion.  Neurological:     Mental Status: He is alert and oriented to person, place, and time.  Psychiatric:        Attention and Perception: Attention and perception normal.        Mood and Affect: Mood and affect normal.        Speech: Speech normal.        Behavior: Behavior normal. Behavior is cooperative.        Thought Content: Thought content normal.        Cognition and Memory: Cognition and memory normal.        Judgment: Judgment normal.   Review of Systems  Constitutional: Negative.   HENT: Negative.    Eyes: Negative.   Respiratory: Negative.    Cardiovascular: Negative.   Gastrointestinal: Negative.   Genitourinary: Negative.   Musculoskeletal: Negative.   Skin: Negative.   Neurological: Negative.    Endo/Heme/Allergies: Negative.   Psychiatric/Behavioral:  Positive for substance abuse.   Blood pressure 104/63, pulse 63, temperature 98.7 F (37.1 C), temperature source Oral, resp. rate 20, height  (1.753 m), weight 97 kg, SpO2 100 %.  Body mass index is 31.58 kg/m.  Treatment Plan Summary: Patient reviewed with Dr. Bronwen Betters. Social work consult initiated to provide substance use treatment resources. Patient cleared by psychiatry. Follow-up with established outpatient psychiatry, continue current medications. Follow-up with substance use treatment resources provided.   Disposition: No evidence of imminent risk to self or others at present.   Patient does not meet criteria for psychiatric inpatient admission. Supportive therapy provided about ongoing stressors. Discussed crisis plan, support from social network, calling 911, coming to the Emergency Department, and calling Suicide Hotline.  This service was provided via telemedicine using a 2-way, interactive audio and video technology.  Names of all persons participating in this telemedicine service and their role in this encounter. Name: Haze Justin Role: Patient  Name: Festus Holts telephone Role: Patient's wife  Name: Dr Bronwen Betters Role: Psychiatry    Lenard Lance, FNP 10/25/2020 1:28 PM

## 2020-10-25 NOTE — ED Notes (Signed)
Breakfast tray placed on bedside table. Resting at this time. 

## 2020-10-25 NOTE — [facility] Counselor (Addendum)
TTS re-evaluation: Patient presents alert and oriented x 4. He states "I've been here 3 days and I've had a lot of time to think I think I'm ready to go." Patient states he spoke with his wife, Kennyth Arnold, and his NA sponsor who want to take him to a treatment facility today. He denies SI/HI/AVH.   This counselor discussed with Doran Heater, NP who states she will see patient for discharge.

## 2020-10-28 ENCOUNTER — Encounter (HOSPITAL_COMMUNITY): Payer: Self-pay | Admitting: *Deleted

## 2020-10-28 ENCOUNTER — Emergency Department (HOSPITAL_COMMUNITY)
Admission: EM | Admit: 2020-10-28 | Discharge: 2020-10-30 | Disposition: A | Discharge status: Other Acute Inpatient Hospital | Payer: Self-pay | Attending: Emergency Medicine | Admitting: Emergency Medicine

## 2020-10-28 ENCOUNTER — Other Ambulatory Visit: Payer: Self-pay

## 2020-10-28 ENCOUNTER — Emergency Department (HOSPITAL_COMMUNITY): Payer: Self-pay

## 2020-10-28 DIAGNOSIS — F1524 Other stimulant dependence with stimulant-induced mood disorder: Secondary | ICD-10-CM | POA: Insufficient documentation

## 2020-10-28 DIAGNOSIS — R319 Hematuria, unspecified: Secondary | ICD-10-CM | POA: Insufficient documentation

## 2020-10-28 DIAGNOSIS — R197 Diarrhea, unspecified: Secondary | ICD-10-CM | POA: Insufficient documentation

## 2020-10-28 DIAGNOSIS — F332 Major depressive disorder, recurrent severe without psychotic features: Secondary | ICD-10-CM | POA: Diagnosis present

## 2020-10-28 DIAGNOSIS — R1013 Epigastric pain: Secondary | ICD-10-CM | POA: Insufficient documentation

## 2020-10-28 DIAGNOSIS — F1510 Other stimulant abuse, uncomplicated: Secondary | ICD-10-CM

## 2020-10-28 DIAGNOSIS — Z20822 Contact with and (suspected) exposure to covid-19: Secondary | ICD-10-CM | POA: Insufficient documentation

## 2020-10-28 DIAGNOSIS — R45851 Suicidal ideations: Secondary | ICD-10-CM

## 2020-10-28 DIAGNOSIS — F17210 Nicotine dependence, cigarettes, uncomplicated: Secondary | ICD-10-CM | POA: Insufficient documentation

## 2020-10-28 DIAGNOSIS — X838XXA Intentional self-harm by other specified means, initial encounter: Secondary | ICD-10-CM | POA: Insufficient documentation

## 2020-10-28 DIAGNOSIS — S41112A Laceration without foreign body of left upper arm, initial encounter: Secondary | ICD-10-CM | POA: Insufficient documentation

## 2020-10-28 DIAGNOSIS — R110 Nausea: Secondary | ICD-10-CM | POA: Insufficient documentation

## 2020-10-28 DIAGNOSIS — F1520 Other stimulant dependence, uncomplicated: Secondary | ICD-10-CM | POA: Diagnosis present

## 2020-10-28 DIAGNOSIS — J45909 Unspecified asthma, uncomplicated: Secondary | ICD-10-CM | POA: Insufficient documentation

## 2020-10-28 LAB — CBC WITH DIFFERENTIAL/PLATELET
Abs Immature Granulocytes: 0.02 10*3/uL (ref 0.00–0.07)
Basophils Absolute: 0 10*3/uL (ref 0.0–0.1)
Basophils Relative: 0 %
Eosinophils Absolute: 0.5 10*3/uL (ref 0.0–0.5)
Eosinophils Relative: 5 %
HCT: 44 % (ref 39.0–52.0)
Hemoglobin: 14.6 g/dL (ref 13.0–17.0)
Immature Granulocytes: 0 %
Lymphocytes Relative: 22 %
Lymphs Abs: 2.2 10*3/uL (ref 0.7–4.0)
MCH: 28.7 pg (ref 26.0–34.0)
MCHC: 33.2 g/dL (ref 30.0–36.0)
MCV: 86.6 fL (ref 80.0–100.0)
Monocytes Absolute: 0.9 10*3/uL (ref 0.1–1.0)
Monocytes Relative: 9 %
Neutro Abs: 6.4 10*3/uL (ref 1.7–7.7)
Neutrophils Relative %: 64 %
Platelets: 333 10*3/uL (ref 150–400)
RBC: 5.08 MIL/uL (ref 4.22–5.81)
RDW: 13.8 % (ref 11.5–15.5)
WBC: 10.2 10*3/uL (ref 4.0–10.5)
nRBC: 0 % (ref 0.0–0.2)

## 2020-10-28 LAB — URINALYSIS, ROUTINE W REFLEX MICROSCOPIC
Bilirubin Urine: NEGATIVE
Glucose, UA: NEGATIVE mg/dL
Ketones, ur: 20 mg/dL — AB
Leukocytes,Ua: NEGATIVE
Nitrite: NEGATIVE
Protein, ur: NEGATIVE mg/dL
Specific Gravity, Urine: 1.023 (ref 1.005–1.030)
pH: 5 (ref 5.0–8.0)

## 2020-10-28 LAB — COMPREHENSIVE METABOLIC PANEL
ALT: 24 U/L (ref 0–44)
AST: 26 U/L (ref 15–41)
Albumin: 4.4 g/dL (ref 3.5–5.0)
Alkaline Phosphatase: 65 U/L (ref 38–126)
Anion gap: 11 (ref 5–15)
BUN: 17 mg/dL (ref 6–20)
CO2: 27 mmol/L (ref 22–32)
Calcium: 9.7 mg/dL (ref 8.9–10.3)
Chloride: 104 mmol/L (ref 98–111)
Creatinine, Ser: 1.14 mg/dL (ref 0.61–1.24)
GFR, Estimated: >60 mL/min (ref >60)
Glucose, Bld: 103 mg/dL — ABNORMAL HIGH (ref 70–99)
Potassium: 4.5 mmol/L (ref 3.5–5.1)
Sodium: 142 mmol/L (ref 135–145)
Total Bilirubin: 1.9 mg/dL — ABNORMAL HIGH (ref 0.3–1.2)
Total Protein: 8.3 g/dL — ABNORMAL HIGH (ref 6.5–8.1)

## 2020-10-28 LAB — RAPID URINE DRUG SCREEN, HOSP PERFORMED
Amphetamines: POSITIVE — AB
Barbiturates: NOT DETECTED
Benzodiazepines: NOT DETECTED
Cocaine: NOT DETECTED
Opiates: NOT DETECTED
Tetrahydrocannabinol: NOT DETECTED

## 2020-10-28 LAB — ETHANOL: Alcohol, Ethyl (B): <10 mg/dL (ref <10)

## 2020-10-28 LAB — LIPASE, BLOOD: Lipase: 25 U/L (ref 11–51)

## 2020-10-28 MED ORDER — QUETIAPINE FUMARATE 100 MG PO TABS
100.0000 mg | ORAL_TABLET | Freq: Every day | ORAL | Status: DC
  Administered 2020-10-28 – 2020-10-29 (×2): 100 mg via ORAL
  Filled 2020-10-28 (×2): qty 1

## 2020-10-28 MED ORDER — SODIUM CHLORIDE 0.9 % IV SOLN
1000.0000 mL | Freq: Once | INTRAVENOUS | Status: AC
  Administered 2020-10-28: 1000 mL via INTRAVENOUS

## 2020-10-28 MED ORDER — ONDANSETRON HCL 4 MG/2ML IJ SOLN
4.0000 mg | Freq: Once | INTRAMUSCULAR | Status: AC
  Administered 2020-10-28: 4 mg via INTRAVENOUS
  Filled 2020-10-28: qty 2

## 2020-10-28 NOTE — ED Notes (Signed)
Pt in bed, pt changed into hospital scrubs, pt states that he has been feeling si for the past bit, states that his plan was to overdose, states that he uses recreational drugs, states that he just has a lot going on and doesn't have a lot of supports between friends or family.  Pt requests something to drink and a shower
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
## 2020-10-28 NOTE — ED Provider Notes (Addendum)
[facility] EMERGENCY DEPARTMENT Provider Note   CSN: [id number] Arrival date & time: 10/28/20  1709     History Chief Complaint  Patient presents with   V70.1    Nathan Koch is a 30 y.o. male.  HPI  Patient presents with suicidal ideation and 4 days of abdominal pain.  Patient reports feeling suicidal today.  He has inflicted superficial cuts to his left arm.  Has past attempts, has plan of overdosing on methamphetamines.  He uses meth every day including today.  Has not taken any of his psychiatric medicine in a while.  Denies any homicidal ideations, hallucinations, auditory hallucinations.  The abdominal pain started 4 days ago acutely, pain is constant described as both sharp and dull and achy.  It is worse in the epigastric area.  He is having nausea and diarrhea, but no vomiting.  He also endorses red urine x3 days without dysuria. Denies exacerbating or alleviating factors. he denies any fevers, bloody stool, constipation.    No history of abdominal surgeries.  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   Methamphetamine use disorder, moderate ([name]) 09/14/2019   MDD (major depressive disorder), recurrent, severe, with psychosis ([name]) 09/14/2019   MDD (major depressive disorder), single episode, severe with psychosis ([name]) 09/14/2019   Asthma 06/26/2014    Past Surgical History:  Procedure Laterality Date   CLOSED REDUCTION MANDIBLE N/A 07/02/2018   Procedure: CLOSED REDUCTION MANDIBULAR;  Surgeon:  Vernie Murders, MD;  Location: [facility] ORS;  Service: ENT;  Laterality: N/A;       Family History  Problem Relation Age of Onset   Asthma Mother    Cancer Mother        breast cancer   Ulcers Mother    Cancer Father        esophogeal cancer   Hypertension Father    Asthma Brother     Social History   Tobacco Use   Smoking status: Every Day    Packs/day: 0.50    Years: 8.00    Pack years: 4.00    Types: Cigarettes   Smokeless tobacco: Never  Vaping Use   Vaping Use: Every day   Substances: Nicotine, Flavoring  Substance Use Topics   Alcohol use: Yes    Alcohol/week: 13.0 standard drinks    Types: 6 Cans of beer, 7 Standard drinks or equivalent per week    Comment: previously heavy drinker 2016. most recent 4 beer/ day drinker and none since 01-11-2020   Drug use: Yes    Frequency: 7.0 times per week    Types: Methamphetamines, Marijuana    Comment: last used 09/15/20    Home Medications Prior to Admission medications   Medication Sig Start Date End Date Taking? Authorizing Provider  albuterol (VENTOLIN HFA) 108 (90 Base) MCG/ACT inhaler Inhale 2 puffs into the lungs every 4 (four) hours as needed for wheezing or shortness of breath.    [provider]  hydrOXYzine (ATARAX/VISTARIL) 25 MG tablet Take 1 tablet (  25 mg total) by mouth 3 (three) times daily as needed for anxiety. Patient not taking: No sig reported 10/14/20   Vanetta Mulders, NP  QUEtiapine (SEROQUEL) 100 MG tablet Take 1 tablet (100 mg total) by mouth at bedtime. 10/14/20   Vanetta Mulders, NP  sertraline (ZOLOFT) 50 MG tablet Take 1 tablet (50 mg total) by mouth at bedtime. Patient not taking: No sig reported 10/14/20   Vanetta Mulders, NP  traZODone (DESYREL) 50 MG tablet Take 0.5 tablets (25 mg total) by mouth at bedtime as needed (may give at least 1 hour after bedtime Seroquel dose given if needed for sleep). Patient not taking: No sig reported 10/14/20   Vanetta Mulders, NP     Allergies    Patient has no known allergies.  Review of Systems   Review of Systems  Constitutional:  Negative for chills and fever.  HENT:  Negative for ear pain and sore throat.   Eyes:  Negative for pain and visual disturbance.  Respiratory:  Negative for cough and shortness of breath.   Cardiovascular:  Negative for chest pain and palpitations.  Gastrointestinal:  Positive for abdominal pain, diarrhea and nausea. Negative for vomiting.  Genitourinary:  Negative for dysuria and hematuria.  Musculoskeletal:  Negative for arthralgias and back pain.  Skin:  Negative for color change and rash.  Neurological:  Negative for seizures and syncope.  Psychiatric/Behavioral:  Positive for self-injury and suicidal ideas.   All other systems reviewed and are negative.  Physical Exam Updated Vital Signs BP 118/70   Pulse 91   Temp 98.4 F (36.9 C)   Resp 20   SpO2 96%   Physical Exam Vitals and nursing note reviewed. Exam conducted with a chaperone present.  Constitutional:      Appearance: Normal appearance.  HENT:     Head: Normocephalic and atraumatic.  Eyes:     General: No scleral icterus.       Right eye: No discharge.        Left eye: No discharge.     Extraocular Movements: Extraocular movements intact.     Pupils: Pupils are equal, round, and reactive to light.  Cardiovascular:     Rate and Rhythm: Normal rate and regular rhythm.     Pulses: Normal pulses.     Heart sounds: Normal heart sounds. No murmur heard.   No friction rub. No gallop.  Pulmonary:     Effort: Pulmonary effort is normal. No respiratory distress.     Breath sounds: Normal breath sounds.  Abdominal:     General: Abdomen is flat. Bowel sounds are normal. There is no distension.     Palpations: Abdomen is soft.     Tenderness: There is no abdominal tenderness.     Comments: No abdominal tenderness to palpation  Musculoskeletal:     Comments: Superficial lacerations to the left arm.  Skin:     General: Skin is warm and dry.     Coloration: Skin is not jaundiced.  Neurological:     Mental Status: He is alert. Mental status is at baseline.     Coordination: Coordination normal.    ED Results / Procedures / Treatments   Labs (all labs ordered are listed, but only abnormal results are displayed) Labs Reviewed - No data to display  EKG None  Radiology No results found.  Procedures Procedures   Medications Ordered in ED Medications - No data to display  ED Course  I have reviewed  the triage vital signs and the nursing notes.  Pertinent labs & imaging results that were available during my care of the patient were reviewed by me and considered in my medical decision making (see chart for details).  Clinical Course as of 10/28/20 2135  Mon Oct 28, 2020  2010 Comprehensive metabolic panel(!) No electrolyte derangement, AKI, elevated liver enzymes concerning for hepatitis or biliary disease. [HS]  2010 Lipase, blood Not consistent with pancreatitis. [HS]  2010 CBC with Differential No anemia, no elevated white blood cell count concerning for infectious or inflammatory systemic infection. [HS]  2130 Urinalysis, Routine w reflex microscopic Urine, Clean Catch(!) Not consistent with UTI. Suspect ketones are due to dehydration [HS]  2130 CT Renal Stone Study No kidney stones, no hydronephrosis [HS]    Clinical Course User Index [HS] Theron Arista, PA-C   MDM Rules/Calculators/A&P                          Patient has a history of methamphetamine use disorder, MDD, frequent hospitalizations for suicidal ideation.  He has bipolar 1, but does not take his medicine.  Will check abdominal labs and treat nausea symptomatically.    He is not tender on exam so I have a low suspicion for anything surgical.  Additionally, no vomiting or association with food making gallbladder disease significantly less likely.  Denies any blood in the stool.  Diverticulitis less likely.  He has  not been vomiting so I have low suspicion for electrolyte derangement.  Will order CMP to check for that as well as to check for AKI or liver dysfunction.  UTI is also a consideration given the hematuria.  However, he is not having any flank pain or dysuria.  Kidney stone is possible, the patient does not have history of kidney stones or flank pain.   Please see ED course for interpretation of labs and imaging.  Reevaluation: Patient is feeling fine, although suicidal.  States his nausea is improved.  Requesting food and water.  Patient has stable vitals and is nontoxic and appearing. I suspect his abdominal pain and diarrhea is likely some type of gastritis or reaction from drug abuse.  He is not febrile or with vomiting, so I doubt gastroenteritis at this time.  Based on this workup, I do not suspect he is having any emergent GI issue at this time.    From a medical perspective he is cleared.  We will consult to TTS.  Final Clinical Impression(s) / ED Diagnoses Final diagnoses:  None    Rx / DC Orders ED Discharge Orders     None        Theron Arista, Cordelia Poche 10/28/20 2132    Theron Arista, PA-C 10/28/20 2134    Theron Arista, PA-C 10/28/20 2137    Bethann Berkshire, MD 10/29/20 [phone]

## 2020-10-28 NOTE — ED Notes (Signed)
Patient transported to CT 

## 2020-10-28 NOTE — ED Triage Notes (Signed)
Suicidal thoughts for the past 30 days

## 2020-10-28 NOTE — ED Notes (Signed)
Pt being assessed by TTS 

## 2020-10-29 DIAGNOSIS — R45851 Suicidal ideations: Secondary | ICD-10-CM

## 2020-10-29 NOTE — ED Notes (Signed)
TTS with pt  

## 2020-10-29 NOTE — Progress Notes (Signed)
CSW contacted by TTS requesting assistance in possibly helping pt get to the [facility]. CSW reached out to [facility] who provided CSW with questions to ask pt. CSW spoke with pt in ED about interest in going to [facility], pt then asked if CSW wanted honesty, pt states he does not want to go to [facility] and is not interested. CSW then explained that was pts choice. CSW inquired about resources for substance use, pt was interested. CSW provided pt with resources for substance use, homelessness, and [facility]. Pt then asked if we were planning to d/c him. CSW stated possibly but CSW did not have answer. Pt requesting to speak with TTS at this time. CSW provided updates to [employer], LCSW and Dorena Bodo, NP. TOC signing off.

## 2020-10-29 NOTE — ED Provider Notes (Signed)
Emergency Medicine Observation Re-evaluation Note  Nathan Koch is a 30 y.o. male, seen on rounds today.  Pt initially presented to the ED for complaints of V70.1 Currently, the patient is cooperative.  Physical Exam  BP 122/68 (BP Location: Right Arm)   Pulse 72   Temp 97.6 F (36.4 C) (Oral)   Resp 18   SpO2 100%  Physical Exam General: No acute distress Cardiac: Regular rate Lungs: No respiratory distress Psych: Cooperative, alert  ED Course / MDM  EKG:EKG Interpretation  Date/Time:  Monday October 28 2020 20:02:05 EDT Ventricular Rate:  62 PR Interval:  142 QRS Duration: 82 QT Interval:  440 QTC Calculation: 446 R Axis:   90 Text Interpretation: Normal sinus rhythm with sinus arrhythmia Confirmed by Alvester Chou [phone]) on 10/29/2020 10:00:35 AM  I have reviewed the labs performed to date as well as medications administered while in observation.  Recent changes in the last 24 hours include none.  Plan  Current plan is for patient to be likely placed for inpatient bed per psychiatry team.   Derwood Kaplan, MD 10/29/20 1236

## 2020-10-29 NOTE — ED Notes (Signed)
Report given to telesitter.

## 2020-10-29 NOTE — Consult Note (Addendum)
Telepsych Consultation   Reason for Consult:  psychiatric evaluation Referring Physician:  Dr. Rhunette CroftNanavati, EDP Location of Patient: [facility]  Patient Identification: Nathan RomansMickey L Koch MRN:  [id number] Principal Diagnosis: Suicidal ideation Diagnosis:  Principal Problem:   Suicidal ideation Active Problems:   Methamphetamine use disorder, moderate ([name])   MDD (major depressive disorder), recurrent severe, without psychosis ([name])   Total Time spent with patient: 30 minutes  Subjective:   Nathan RomansMickey L Koch is a 30 y.o. male patient admitted with per admit note:   Patient reports feeling suicidal today.  He has inflicted superficial cuts to his left arm.  Has past attempts, has plan of overdosing on methamphetamines.  He uses meth every day including today.  Has not taken any of his psychiatric medicine in a while.  Denies any homicidal ideations, hallucinations, auditory hallucinations.  Marland Kitchen.  HPI:  Nathan Koch, is 30 y.o male, seen by this provider via tele psych.  Patient reports "I was feeling better than I felt worse "I have started self harming and suicidal with intention to die".  Reports that I am going to overdose on meth" reports that patient had taken a suggestion from last week and stayed as inpatient. Endorses there is one person who he needs to avoid in order to stay off of meth.  He is alert and oriented to person place time and situation, eye contact is fair throughout interview, he is sitting calmly in the chair in the emergency department.  Describes his mood as depressed and tired, affect congruent.  Speech normal, volume normal.  Denies auditory and visual hallucinations, reports that yesterday he was hearing voices, no commands.  Endorses suicidal ideation with intent to die and plan to overdose on meth.  Has access to methamphetamine. Unable to contract for safety.  Denies homicidal ideation.  Endorses meth use, last time yesterday.   Reports that [facility], reports [facility].  This patient has had [facility], once in May and once in June.  Endorses that he needs detox for his methamphetamine problem.  Reports that he is homeless.  Reports that CPS in is involved.  Reports he has a court date on July 22, does not feel that he will get in a lot of trouble, would not elaborate on the details of this court date, reports he is not trying to avoid court.  Reports that his support system is his wife, states that she is only supportive on Monday Tuesday and Wednesdays other times "she does not give a shit". Reports he has not been taking his psychiatric medications.   Past Psychiatric History:   Risk to Self:  yes Risk to Others:  no Prior Inpatient Therapy:  yes Prior Outpatient Therapy:  yes  Past Medical History:  Past Medical History:  Diagnosis Date   Anxiety    Anxiety disorder    Asthma    Bipolar 1 disorder ([name])    Depression    Methamphetamine abuse ([name])    PTSD (post-traumatic stress disorder)     Past Surgical History:  Procedure Laterality Date   CLOSED REDUCTION MANDIBLE N/A 07/02/2018   Procedure: CLOSED REDUCTION MANDIBULAR;  Surgeon: Vernie MurdersJuengel, Paul, MD;  Location: [facility] ORS;  Service: ENT;  Laterality: N/A;   Family History:  Family History  Problem Relation Age of Onset   Asthma Mother    Cancer Mother  breast cancer   Ulcers Mother    Cancer Father        esophogeal cancer   Hypertension Father    Asthma Brother    Family Psychiatric  History:  Social History:  Social History   Substance and Sexual Activity  Alcohol Use Yes   Alcohol/week: 13.0 standard drinks   Types: 6 Cans of beer, 7 Standard drinks or equivalent per week   Comment: previously heavy drinker 2016. most recent 4 beer/ day drinker and none since 01-11-2020     Social History   Substance and Sexual Activity  Drug Use Yes    Frequency: 7.0 times per week   Types: Methamphetamines, Marijuana   Comment: last used 09/15/20    Social History   Socioeconomic History   Marital status: Single    Spouse name: Not on file   Number of children: 1   Years of education: 10   Highest education level: 10th grade  Occupational History   Not on file  Tobacco Use   Smoking status: Every Day    Packs/day: 0.50    Years: 8.00    Pack years: 4.00    Types: Cigarettes   Smokeless tobacco: Never  Vaping Use   Vaping Use: Every day   Substances: Nicotine, Flavoring  Substance and Sexual Activity   Alcohol use: Yes    Alcohol/week: 13.0 standard drinks    Types: 6 Cans of beer, 7 Standard drinks or equivalent per week    Comment: previously heavy drinker 2016. most recent 4 beer/ day drinker and none since 01-11-2020   Drug use: Yes    Frequency: 7.0 times per week    Types: Methamphetamines, Marijuana    Comment: last used 09/15/20   Sexual activity: Not on file  Other Topics Concern   Not on file  Social History Narrative   Not on file   Social Determinants of Health   Financial Resource Strain: Not on file  Food Insecurity: Not on file  Transportation Needs: Not on file  Physical Activity: Not on file  Stress: Not on file  Social Connections: Not on file   Additional Social History:    Allergies:  No Known Allergies  Labs:  Results for orders placed or performed during the hospital encounter of 10/28/20 (from the past 48 hour(s))  CBC with Differential     Status: None   Collection Time: 10/28/20  6:58 PM  Result Value Ref Range   WBC 10.2 4.0 - 10.5 K/uL   RBC 5.08 4.22 - 5.81 MIL/uL   Hemoglobin 14.6 13.0 - 17.0 g/dL   HCT 16.1 09.6 - 04.5 %   MCV 86.6 80.0 - 100.0 fL   MCH 28.7 26.0 - 34.0 pg   MCHC 33.2 30.0 - 36.0 g/dL   RDW 40.9 81.1 - 91.4 %   Platelets 333 150 - 400 K/uL   nRBC 0.0 0.0 - 0.2 %   Neutrophils Relative % 64 %   Neutro Abs 6.4 1.7 - 7.7 K/uL   Lymphocytes Relative 22 %    Lymphs Abs 2.2 0.7 - 4.0 K/uL   Monocytes Relative 9 %   Monocytes Absolute 0.9 0.1 - 1.0 K/uL   Eosinophils Relative 5 %   Eosinophils Absolute 0.5 0.0 - 0.5 K/uL   Basophils Relative 0 %   Basophils Absolute 0.0 0.0 - 0.1 K/uL   Immature Granulocytes 0 %   Abs Immature Granulocytes 0.02 0.00 - 0.07 K/uL    Comment: Performed  at [facility], [address], [place], [place] [address]  Comprehensive metabolic panel     Status: Abnormal   Collection Time: 10/28/20  6:58 PM  Result Value Ref Range   Sodium 142 135 - 145 mmol/L   Potassium 4.5 3.5 - 5.1 mmol/L   Chloride 104 98 - 111 mmol/L   CO2 27 22 - 32 mmol/L   Glucose, Bld 103 (H) 70 - 99 mg/dL    Comment: Glucose reference range applies only to samples taken after fasting for at least 8 hours.   BUN 17 6 - 20 mg/dL   Creatinine, Ser 2.42 0.61 - 1.24 mg/dL   Calcium 9.7 8.9 - 35.3 mg/dL   Total Protein 8.3 (H) 6.5 - 8.1 g/dL   Albumin 4.4 3.5 - 5.0 g/dL   AST 26 15 - 41 U/L   ALT 24 0 - 44 U/L   Alkaline Phosphatase 65 38 - 126 U/L   Total Bilirubin 1.9 (H) 0.3 - 1.2 mg/dL   GFR, Estimated >61 >44 mL/min    Comment: (NOTE) Calculated using the CKD-EPI Creatinine Equation (2021)    Anion gap 11 5 - 15    Comment: Performed at [facility], [address], [place], [place] [address]  Lipase, blood     Status: None   Collection Time: 10/28/20  6:58 PM  Result Value Ref Range   Lipase 25 11 - 51 U/L    Comment: Performed at [facility], [address], [place], [place] [address]  Urine rapid drug screen (hosp performed)     Status: Abnormal   Collection Time: 10/28/20  7:30 PM  Result Value Ref Range   Opiates NONE DETECTED NONE DETECTED   Cocaine NONE DETECTED NONE DETECTED   Benzodiazepines NONE DETECTED NONE DETECTED   Amphetamines POSITIVE (A) NONE DETECTED   Tetrahydrocannabinol NONE DETECTED NONE DETECTED   Barbiturates NONE DETECTED NONE DETECTED    Comment: (NOTE) DRUG SCREEN FOR MEDICAL PURPOSES ONLY.  IF  CONFIRMATION IS NEEDED FOR ANY PURPOSE, NOTIFY LAB WITHIN 5 DAYS.  LOWEST DETECTABLE LIMITS FOR URINE DRUG SCREEN Drug Class                     Cutoff (ng/mL) Amphetamine and metabolites    1000 Barbiturate and metabolites    200 Benzodiazepine                 200 Tricyclics and metabolites     300 Opiates and metabolites        300 Cocaine and metabolites        300 THC                            50 Performed at [facility], [address], [place], [place] [address]   Urinalysis, Routine w reflex microscopic Urine, Clean Catch     Status: Abnormal   Collection Time: 10/28/20  7:36 PM  Result Value Ref Range   Color, Urine YELLOW YELLOW   APPearance CLEAR CLEAR   Specific Gravity, Urine 1.023 1.005 - 1.030   pH 5.0 5.0 - 8.0   Glucose, UA NEGATIVE NEGATIVE mg/dL   Hgb urine dipstick SMALL (A) NEGATIVE   Bilirubin Urine NEGATIVE NEGATIVE   Ketones, ur 20 (A) NEGATIVE mg/dL   Protein, ur NEGATIVE NEGATIVE mg/dL   Nitrite NEGATIVE NEGATIVE   Leukocytes,Ua NEGATIVE NEGATIVE   WBC, UA 0-5 0 - 5 WBC/hpf  Bacteria, UA RARE (A) NONE SEEN   Squamous Epithelial / LPF 0-5 0 - 5   Mucus PRESENT    Hyaline Casts, UA PRESENT     Comment: Performed at [facility], [address], [place], [place] [address]  Ethanol     Status: None   Collection Time: 10/28/20  7:51 PM  Result Value Ref Range   Alcohol, Ethyl (B) <10 <10 mg/dL    Comment: (NOTE) Lowest detectable limit for serum alcohol is 10 mg/dL.  For medical purposes only. Performed at [facility], [address], [place], [place] [address]     Medications:  Current Facility-Administered Medications  Medication Dose Route Frequency Provider Last Rate Last Admin   QUEtiapine (SEROQUEL) tablet 100 mg  100 mg Oral QHS Theron Arista, PA-C   100 mg at 10/28/20 2240   Current Outpatient Medications  Medication Sig Dispense Refill   albuterol (VENTOLIN HFA) 108 (90 Base) MCG/ACT inhaler Inhale 2 puffs into the lungs every 4  (four) hours as needed for wheezing or shortness of breath.     QUEtiapine (SEROQUEL) 100 MG tablet Take 1 tablet (100 mg total) by mouth at bedtime. 30 tablet 0   hydrOXYzine (ATARAX/VISTARIL) 25 MG tablet Take 1 tablet (25 mg total) by mouth 3 (three) times daily as needed for anxiety. (Patient not taking: No sig reported) 30 tablet 0   sertraline (ZOLOFT) 50 MG tablet Take 1 tablet (50 mg total) by mouth at bedtime. (Patient not taking: No sig reported) 30 tablet 0   traZODone (DESYREL) 50 MG tablet Take 0.5 tablets (25 mg total) by mouth at bedtime as needed (may give at least 1 hour after bedtime Seroquel dose given if needed for sleep). (Patient not taking: No sig reported) 30 tablet 0    Musculoskeletal: Strength & Muscle Tone: within normal limits Gait & Station: normal Patient leans: N/A   Psychiatric Specialty Exam:  Presentation  General Appearance: Appropriate for Environment  Eye Contact:Fair  Speech:Clear and Coherent; Normal Rate  Speech Volume:Normal  Handedness:Right   Mood and Affect  Mood:Depressed; Hopeless  Affect:Congruent   Thought Process  Thought Processes:Coherent; Goal Directed  Descriptions of Associations:Intact  Orientation:Full (Time, Place and Person)  Thought Content:Logical  History of Schizophrenia/Schizoaffective disorder:No  Duration of Psychotic Symptoms:N/A  Hallucinations:Hallucinations: Auditory Description of Auditory Hallucinations: yesterday, none today. Ideas of Reference:None  Suicidal Thoughts:Suicidal Thoughts: Yes, Active SI Active Intent and/or Plan: With Intent; With Plan Homicidal Thoughts:Homicidal Thoughts: No  Sensorium  Memory:Immediate Good; Recent Good; Remote Good  Judgment:Poor  Insight:Fair   Executive Functions  Concentration:Fair  Attention Span:Fair  Recall:Fair  Fund of Knowledge:Fair  Language:Good   Psychomotor Activity  Psychomotor Activity: Psychomotor Activity:  Normal  Assets  Assets:Communication Skills; Desire for Improvement; Physical Health   Sleep  Sleep: Sleep: Poor   Physical Exam: Physical Exam Pulmonary:     Effort: Pulmonary effort is normal.  Neurological:     Mental Status: He is alert and oriented to person, place, and time.  Psychiatric:        Attention and Perception: He is inattentive (kept looking down at floor).        Mood and Affect: Mood is depressed. Affect is flat.        Speech: Speech normal.        Behavior: Behavior is withdrawn. Behavior is cooperative.        Thought Content: Thought content is not paranoid or delusional. Thought content includes suicidal ideation. Thought content does  not include homicidal ideation. Thought content includes suicidal plan. Thought content does not include homicidal plan.        Cognition and Memory: Cognition normal.   Review of Systems  Constitutional:  Negative for chills and fever.  Respiratory:  Negative for shortness of breath.   Cardiovascular:  Negative for chest pain.  Gastrointestinal:  Negative for abdominal pain, nausea and vomiting.  Neurological:  Negative for headaches.  Psychiatric/Behavioral:  Positive for hallucinations (yesterday, none today), substance abuse and suicidal ideas.   Blood pressure 122/68, pulse 72, temperature 97.6 F (36.4 C), temperature source Oral, resp. rate 18, SpO2 100 %. There is no height or weight on file to calculate BMI.  Treatment Plan Summary: Daily contact with patient to assess and evaluate symptoms and progress in treatment and Medication management  Disposition: Recommend psychiatric Inpatient admission when medically cleared. Staff to monitor for safety and stabilization while awaiting bed placement.   This service was provided via telemedicine using a 2-way, interactive audio and video technology.  Names of all persons participating in this telemedicine service and their role in this encounter. Name: Feliberto Stockley  Role: patient  Name: Dorena Bodo Role: NP   EDP,  ED staff, and CSW notified of disposition via secure chat.  Novella Olive, NP 10/29/2020 11:19 AM

## 2020-10-29 NOTE — Progress Notes (Signed)
Patient has been faxed out due to no bed availability at [facility]. Patient meets inpatient criteria per Earleen Reaper. Patient referred to the following facilities:  [facility]  [address] [place] [address] [phone]  [facility]  [address] [place] [address] [phone]  [facility]  [address], [place] [address] [phone]  [facility]  [address] [place] [address] [phone]  [facility]  [address] [place] [address] [phone]  [facility]  [address] [place], [place] [address] [phone]  [facility]  [address] [place], [place] [address] [phone]  [facility]  [address] [place] [address] [phone]  [facility]  [address] [place] [address] [phone]  [facility]  [address], [place] [address] [phone]     CSW will continue to monitor disposition.    Damita Dunnings, MSW, LCSW-A  1:16 PM 10/29/2020

## 2020-10-30 LAB — RESP PANEL BY RT-PCR (FLU A&B, COVID) ARPGX2
Influenza A by PCR: NEGATIVE
Influenza B by PCR: NEGATIVE
SARS Coronavirus 2 by RT PCR: NEGATIVE

## 2020-10-30 NOTE — Progress Notes (Signed)
Patient has been referred out due to no bed available at [facility]. Patient meets inpatient criteria per Earleen Reaper. Patient referred to the following facilities:  [facility]  [address] [place] [address] [phone]  [facility]  [address] [place] [address] [phone]  [facility]  [address], [place] [address] [phone]  [facility]  [address] [place] [address] [phone]  [facility]  [address] [place] [address] [phone]  [facility]  [address] [place],  [place] [address] [phone]  [facility]  [address] [place], [place] [address] [phone]  [facility]  [address] [place] [address] [phone]  [facility]  [address], [place] [address] [phone]  [facility]  [address] [place] [address] [phone]    CSW will continue to monitor disposition.    Damita Dunnings, MSW, LCSW-A  9:51 AM 10/30/2020

## 2020-10-30 NOTE — ED Provider Notes (Signed)
Emergency Medicine Observation Re-evaluation Note    Nathan Koch is a 30 y.o. male, seen on rounds today.   Nathan Koch is a 30 y.o. male, seen on rounds today. Currently, the patient is cooperative. Physical Exam  BP 120/62   Pulse 69   Temp 98 F (36.7 C) (Oral)   Resp 18   SpO2 100%  Physical Exam General: No acute distress Cardiac: Regular rate Lungs: No respiratory distress Psych: Cooperative, alert  ED Course / MDM  EKG:EKG Interpretation  Date/Time:  Monday October 28 2020 20:02:05 EDT Ventricular Rate:  62 PR Interval:  142 QRS Duration: 82 QT Interval:  440 QTC Calculation: 446 R Axis:   90 Text Interpretation: Normal sinus rhythm with sinus arrhythmia Confirmed by Alvester Chou [phone]) on 10/29/2020 10:00:35 AM  I have reviewed the labs performed to date as well as medications administered while in observation.  Recent changes in the last 24 hours include : none.  Plan  Current plan is for transfer to outside hospital.   Pt accepted to [facility], Nathan Genta, MD 10/30/20 [phone]

## 2020-10-30 NOTE — ED Notes (Signed)
Patient awake and lying in bed. Respirations even and unlabored. NAD noted. Report received from ginger, RN

## 2020-10-30 NOTE — Progress Notes (Signed)
Pt accepted to [facility]     Patient meets inpatient criteria per Dorena Bodo, NP.     Serafina Mitchell Marlan Palau is the attending provider.    Call report to [phone]   Viann Fish, RN @ [facility] ED notified.     Pt scheduled  to arrive at [facility] today by 1500   Damita Dunnings, MSW, LCSW-A  10:48 AM 10/30/2020

## 2020-11-08 ENCOUNTER — Other Ambulatory Visit: Payer: Self-pay

## 2020-11-08 ENCOUNTER — Emergency Department (HOSPITAL_COMMUNITY)
Admission: EM | Admit: 2020-11-08 | Discharge: 2020-11-09 | Disposition: A | Discharge status: Home / Self Care | Payer: Self-pay | Attending: Emergency Medicine | Admitting: Emergency Medicine

## 2020-11-08 ENCOUNTER — Encounter (HOSPITAL_COMMUNITY): Payer: Self-pay | Admitting: Emergency Medicine

## 2020-11-08 DIAGNOSIS — R45851 Suicidal ideations: Secondary | ICD-10-CM | POA: Insufficient documentation

## 2020-11-08 DIAGNOSIS — F333 Major depressive disorder, recurrent, severe with psychotic symptoms: Secondary | ICD-10-CM | POA: Insufficient documentation

## 2020-11-08 DIAGNOSIS — F17210 Nicotine dependence, cigarettes, uncomplicated: Secondary | ICD-10-CM | POA: Insufficient documentation

## 2020-11-08 DIAGNOSIS — F1510 Other stimulant abuse, uncomplicated: Secondary | ICD-10-CM | POA: Insufficient documentation

## 2020-11-08 DIAGNOSIS — J45909 Unspecified asthma, uncomplicated: Secondary | ICD-10-CM | POA: Insufficient documentation

## 2020-11-08 LAB — COMPREHENSIVE METABOLIC PANEL
ALT: 34 U/L (ref 0–44)
AST: 26 U/L (ref 15–41)
Albumin: 4.5 g/dL (ref 3.5–5.0)
Alkaline Phosphatase: 75 U/L (ref 38–126)
Anion gap: 11 (ref 5–15)
BUN: 15 mg/dL (ref 6–20)
CO2: 27 mmol/L (ref 22–32)
Calcium: 9.4 mg/dL (ref 8.9–10.3)
Chloride: 100 mmol/L (ref 98–111)
Creatinine, Ser: 1.08 mg/dL (ref 0.61–1.24)
GFR, Estimated: >60 mL/min (ref >60)
Glucose, Bld: 117 mg/dL — ABNORMAL HIGH (ref 70–99)
Potassium: 4 mmol/L (ref 3.5–5.1)
Sodium: 138 mmol/L (ref 135–145)
Total Bilirubin: 1.9 mg/dL — ABNORMAL HIGH (ref 0.3–1.2)
Total Protein: 8.1 g/dL (ref 6.5–8.1)

## 2020-11-08 LAB — ETHANOL: Alcohol, Ethyl (B): <10 mg/dL (ref <10)

## 2020-11-08 LAB — CBC
HCT: 44.5 % (ref 39.0–52.0)
Hemoglobin: 14.9 g/dL (ref 13.0–17.0)
MCH: 28.9 pg (ref 26.0–34.0)
MCHC: 33.5 g/dL (ref 30.0–36.0)
MCV: 86.2 fL (ref 80.0–100.0)
Platelets: 368 10*3/uL (ref 150–400)
RBC: 5.16 MIL/uL (ref 4.22–5.81)
RDW: 14.3 % (ref 11.5–15.5)
WBC: 11.1 10*3/uL — ABNORMAL HIGH (ref 4.0–10.5)
nRBC: 0 % (ref 0.0–0.2)

## 2020-11-08 LAB — ACETAMINOPHEN LEVEL: Acetaminophen (Tylenol), Serum: <10 ug/mL — ABNORMAL LOW (ref 10–30)

## 2020-11-08 LAB — RAPID URINE DRUG SCREEN, HOSP PERFORMED
Amphetamines: POSITIVE — AB
Barbiturates: NOT DETECTED
Benzodiazepines: NOT DETECTED
Cocaine: NOT DETECTED
Opiates: NOT DETECTED
Tetrahydrocannabinol: NOT DETECTED

## 2020-11-08 LAB — SALICYLATE LEVEL: Salicylate Lvl: <7 mg/dL — ABNORMAL LOW (ref 7.0–30.0)

## 2020-11-08 NOTE — BH Assessment (Addendum)
Comprehensive Clinical Assessment (CCA) Screening, Triage and Referral Note  11/09/2020 Nathan Koch [id number] Disposition: Clinician discussed patient care with Nathan Bering, NP.  She recommended patient be observed in the ED and seen by psychiatry later on 07/16.  Clinician notified RN Tressie Ellis and Dr. Glynn Octave of recommendation via secure messaging.  Patient presents with a flat affect.  He is oriented x4.  He does not appear to be responding to internal stimuli.  Pt does not evidence any delusonal thought problem.  Pt does not evidence any delusional thought process.  Pt reports appetite and sleep WNL.  Pt gave verbal permission to contact his fiance, Nathan Koch but there was no answer when clinician called.   Pt has outpatient services through [facility] in [place].  Pt was at [facility] on May 17-21, '22.     Chief Complaint:  Chief Complaint  Patient presents with   Suicidal   Visit Diagnosis: MDD recurrent, severe w/ psychotic features; Amphetamine use d/o severe  Patient Reported Information How did you hear about [place]? Self (Pt says he walked to [facility].)  What Is the Reason for Your Visit/Call Today? Pt was at home when he felt like he wanted to kill himself.  He walked to [facility] because he felt like he wanted to stab himself to death.  Patient is guarded about what may have happened prior to him walking to [facility].  He says "I  really don't want to talk about it."  Patient was pressed about whether it was a reaction to an arguement and he says "kinda.".  Patinet hears voices that tell him to hurt himself and he says he can tune them out "just a little bit."  Patinent denies any HI.  Pt will see "random shapes or people who are not really there."  Pt uses  methamphetamine and used some around 10 hours ago; pt smokes about 1 gram a day of meth; smoking at that rate for the last month.  Pt gave permission to call his fiance Nathan Koch [phone].  Pt has services with  [facility] in [place] but has not been there in about 3 months.  He cites transportation issues as a barrier to treatment.  How Long Has This Been Causing You Problems? > than 6 months  What Do You Feel Would Help You the Most Today? Treatment for Depression or other mood problem   Have You Recently Had Any Thoughts About Hurting Yourself? Yes  Are You Planning to Commit Suicide/Harm Yourself At This time? Yes   Have you Recently Had Thoughts About Hurting Someone Nathan Koch? No  Are You Planning to Harm Someone at This Time? No  Explanation: No data recorded  Have You Used Any Alcohol or Drugs in the Past 24 Hours? Yes  How Long Ago Did You Use Drugs or Alcohol? No data recorded What Did You Use and How Much? Smoked meth around 10 hours ago.  Pt uses meth regularly.   Do You Currently Have a Therapist/Psychiatrist? Yes  Name of Therapist/Psychiatrist: Dr. [name] at [facility] in [place].  Pt has not been in the last 3 months.   Have You Been Recently Discharged From Any Office Practice or Programs? No  Explanation of Discharge From Practice/Program: No data recorded   CCA Screening Triage Referral Assessment Type of Contact: Tele-Assessment  Telemedicine Service Delivery:   Is this Initial or Reassessment? Initial Assessment  Date Telepsych consult ordered in [facility]:  11/08/20  Time Telepsych consult ordered in [facility]:  2315  Location of Assessment: [facility] ED  Provider Location: [facility]   Collateral Involvement: Nathan Koch [phone].  She is pt's  fiance.   Does Patient Have a Automotive engineer Guardian? No data recorded Name and Contact of Legal Guardian: Self  If Minor and Not Living with Parent(s), Who has Custody? N/A  Is CPS involved or ever been involved? Never  Is APS involved or ever been involved? Never   Patient Determined To Be At Risk for Harm To Self or Others Based on Review of Patient Reported Information or Presenting  Complaint? Yes, for Self-Harm  Method: No data recorded Availability of Means: No data recorded Intent: No data recorded Notification Required: No data recorded Additional Information for Danger to Others Potential: No data recorded Additional Comments for Danger to Others Potential: No data recorded Are There Guns or Other Weapons in Your Home? No data recorded Types of Guns/Weapons: No data recorded Are These Weapons Safely Secured?                            No data recorded Who Could Verify You Are Able To Have These Secured: No data recorded Do You Have any Outstanding Charges, Pending Court Dates, Parole/Probation? No data recorded Contacted To Inform of Risk of Harm To Self or Others: Other: Comment (Pt cannot curently contract for safety.)   Does Patient Present under Involuntary Commitment? No  IVC Papers Initial File Date: No data recorded  [place] of Residence: [place]   Patient Currently Receiving the Following Services: Medication Management   Determination of Need: Urgent (48 hours)   Options For Referral: Other: Comment (Observe overnight and have psychiatry assess.)   Discharge Disposition:     [name], LCAS

## 2020-11-08 NOTE — ED Notes (Signed)
Patient in restroom getting urine specimen and changing into burgundy scrubs at this time. Belongings placed in bag and locked up at this time also.

## 2020-11-08 NOTE — ED Triage Notes (Addendum)
Pt states he is having suicidal thoughts, states he was thinking about stabbing himself.  Used meth x 6 hours ago

## 2020-11-08 NOTE — ED Notes (Signed)
Patient wand by Security at this time. Patient being brought back to Hallway 8

## 2020-11-08 NOTE — ED Notes (Signed)
Pt provided drink and meal tray. Pt calm and cooperative at this time.

## 2020-11-08 NOTE — ED Provider Notes (Signed)
[facility] EMERGENCY DEPARTMENT Provider Note  CSN: [id number] Arrival date & time: 11/08/20 1945    History Chief Complaint  Patient presents with   Suicidal    Nathan Koch is a 30 y.o. male with history of methamphetamine use and frequent ED visits for suicidal ideation reports he has been feeling suicidal since earlier today with a plan to stab himself. He is hearing voices but is aware that they aren't real and can tune them out. Continues to use meth. No medical complaints.    Past Medical History:  Diagnosis Date   Anxiety    Anxiety disorder    Asthma    Bipolar 1 disorder ([name])    Depression    Methamphetamine abuse ([name])    PTSD (post-traumatic stress disorder)     Past Surgical History:  Procedure Laterality Date   CLOSED REDUCTION MANDIBLE N/A 07/02/2018   Procedure: CLOSED REDUCTION MANDIBULAR;  Surgeon: Vernie Murders, MD;  Location: [facility] ORS;  Service: ENT;  Laterality: N/A;    Family History  Problem Relation Age of Onset   Asthma Mother    Cancer Mother        breast cancer   Ulcers Mother    Cancer Father        esophogeal cancer   Hypertension Father    Asthma Brother     Social History   Tobacco Use   Smoking status: Every Day    Packs/day: 0.50    Years: 8.00    Pack years: 4.00    Types: Cigarettes   Smokeless tobacco: Never  Vaping Use   Vaping Use: Every day   Substances: Nicotine, Flavoring  Substance Use Topics   Alcohol use: Yes    Alcohol/week: 13.0 standard drinks    Types: 6 Cans of beer, 7 Standard drinks or equivalent per week    Comment: previously heavy drinker 2016. most recent 4 beer/ day drinker and none since 01-11-2020   Drug use: Yes    Frequency: 7.0 times per week    Types: Methamphetamines, Marijuana    Comment: used meth x 6 hours ago     Home Medications Prior to Admission medications   Medication Sig Start Date End Date Taking? Authorizing Provider  albuterol (VENTOLIN HFA) 108 (90 Base) MCG/ACT  inhaler Inhale 2 puffs into the lungs every 4 (four) hours as needed for wheezing or shortness of breath.   Yes [provider]  QUEtiapine (SEROQUEL) 100 MG tablet Take 1 tablet (100 mg total) by mouth at bedtime. 10/14/20  Yes Vanetta Mulders, NP  sertraline (ZOLOFT) 50 MG tablet Take 1 tablet (50 mg total) by mouth at bedtime. 10/14/20  Yes Vanetta Mulders, NP  traZODone (DESYREL) 50 MG tablet Take 0.5 tablets (25 mg total) by mouth at bedtime as needed (may give at least 1 hour after bedtime Seroquel dose given if needed for sleep). 10/14/20  Yes Vanetta Mulders, NP  hydrOXYzine (ATARAX/VISTARIL) 25 MG tablet Take 1 tablet (25 mg total) by mouth 3 (three) times daily as needed for anxiety. Patient not taking: No sig reported 10/14/20   Vanetta Mulders, NP     Allergies    Patient has no known allergies.   Review of Systems   Review of Systems A comprehensive review of systems was completed and negative except as noted in HPI.    Physical Exam BP 121/73 (BP Location: Right Arm)   Pulse 70   Temp 98.5 F (36.9 C) (Oral)  Resp 14   Ht 5\' 9"  (1.753 m)   Wt 96.6 kg   SpO2 98%   BMI 31.45 kg/m   Physical Exam Vitals and nursing note reviewed.  Constitutional:      Appearance: Normal appearance.  HENT:     Head: Normocephalic and atraumatic.     Nose: Nose normal.     Mouth/Throat:     Mouth: Mucous membranes are moist.  Eyes:     Extraocular Movements: Extraocular movements intact.     Conjunctiva/sclera: Conjunctivae normal.  Cardiovascular:     Rate and Rhythm: Normal rate.  Pulmonary:     Effort: Pulmonary effort is normal.     Breath sounds: Normal breath sounds.  Abdominal:     General: Abdomen is flat.     Palpations: Abdomen is soft.     Tenderness: There is no abdominal tenderness.  Musculoskeletal:        General: No swelling. Normal range of motion.     Cervical back: Neck supple.  Skin:    General: Skin is warm and dry.  Neurological:      General: No focal deficit present.     Mental Status: He is alert.  Psychiatric:        Mood and Affect: Mood normal.     Comments: Calm and cooperative     ED Results / Procedures / Treatments   Labs (all labs ordered are listed, but only abnormal results are displayed) Labs Reviewed  RAPID URINE DRUG SCREEN, HOSP PERFORMED - Abnormal; Notable for the following components:      Result Value   Amphetamines POSITIVE (*)    All other components within normal limits  CBC - Abnormal; Notable for the following components:   WBC 11.1 (*)    All other components within normal limits  COMPREHENSIVE METABOLIC PANEL  ETHANOL  SALICYLATE LEVEL  ACETAMINOPHEN LEVEL    EKG None  Radiology No results found.  Procedures Procedures  Medications Ordered in the ED Medications - No data to display   MDM Rules/Calculators/A&P MDM Patient with have labs for medical clearance and if neg, plan TTS evaluation.   ED Course  I have reviewed the triage vital signs and the nursing notes.  Pertinent labs & imaging results that were available during my care of the patient were reviewed by me and considered in my medical decision making (see chart for details).     Final Clinical Impression(s) / ED Diagnoses Final diagnoses:  Suicidal ideation  Methamphetamine abuse [facility])    Rx / DC Orders ED Discharge Orders     None        [facility], MD 11/08/20 2257

## 2020-11-09 NOTE — Discharge Instructions (Addendum)
Follow-up as instructed by behavioral health 

## 2020-11-09 NOTE — ED Notes (Addendum)
attempted to d/c pt.  Pt states that he is "not in a place to be discharged." states that he is still having suicidal thoughts.  Secured chat sent to  T. Lewis NP.

## 2020-11-09 NOTE — Consult Note (Signed)
Telepsych Consultation   Reason for Consult:  Suicidal  Referring Physician:  Emergency Room Provider  Location of Patient: [facility] Location of Provider: [facility]  Patient Identification: Nathan Koch MRN:  [id number] Principal Diagnosis: MDD (major depressive disorder), recurrent, severe, with psychosis ([name]) Diagnosis:  Principal Problem:   MDD (major depressive disorder), recurrent, severe, with psychosis ([name])   Total Time spent with patient: 15 minutes  Subjective:   Nathan Koch is a 30 y.o. male was seen and evaluated via tele-assessment.  Patient is well-known to this service.  Chart review multiple emergency room visits for suicidal ideations and methamphetamine abuse.  Today he reports " feeling bad" states that he is currently followed by psychiatry at [facility] Dr. Leona Singleton where he is prescribed Zoloft Seroquel and trazodone.  States he has not seen his outpatient provider in the past 3 months.  States he recently completed residential treatment with [facility] 9 months prior.  Reports he is hopeful to find residential treatment closer to [facility] or [facility]. CSW to follow up with outpatient resources. Case staffed with MD Lucianne Muss.  Support, encouragement and reassurances was provided.   HPI:  Per admission assessment note: Pt was at home when he felt like he wanted to kill himself.  He walked to [facility] because he felt like he wanted to stab himself to death.  Patient is guarded about what may have happened prior to him walking to [facility].  He says "I  really don't want to talk about it."  Patient was pressed about whether it was a reaction to an arguement and he says "kinda.".  Patinet hears voices that tell him to hurt himself and he says he can tune them out "just a little bit."  Patinent denies any HI.  Pt will see "random shapes or people who are not really there."  Pt uses  methamphetamine and used some around 10 hours ago; pt smokes about 1 gram a day of meth; smoking  at that rate for the last month.  Pt gave permission to call his fiance Nathan Koch [phone].  Pt has services with [facility] in [place] but has not been there in about 3 months.  He cites transportation issues as a barrier to treatment.  Past Psychiatric History:   Risk to Self:   Risk to Others:   Prior Inpatient Therapy:   Prior Outpatient Therapy:    Past Medical History:  Past Medical History:  Diagnosis Date   Anxiety    Anxiety disorder    Asthma    Bipolar 1 disorder ([name])    Depression    Methamphetamine abuse ([name])    PTSD (post-traumatic stress disorder)     Past Surgical History:  Procedure Laterality Date   CLOSED REDUCTION MANDIBLE N/A 07/02/2018   Procedure: CLOSED REDUCTION MANDIBULAR;  Surgeon: Vernie Murders, MD;  Location: [facility] ORS;  Service: ENT;  Laterality: N/A;   Family History:  Family History  Problem Relation Age of Onset   Asthma Mother    Cancer Mother        breast cancer   Ulcers Mother    Cancer Father        esophogeal cancer   Hypertension Father    Asthma Brother    Family Psychiatric  History:  Social History:  Social History   Substance and Sexual Activity  Alcohol Use Yes   Alcohol/week: 13.0 standard drinks   Types: 6 Cans of beer, 7 Standard drinks or equivalent per week  Comment: previously heavy drinker 2016. most recent 4 beer/ day drinker and none since 01-11-2020     Social History   Substance and Sexual Activity  Drug Use Yes   Frequency: 7.0 times per week   Types: Methamphetamines, Marijuana   Comment: used meth x 6 hours ago    Social History   Socioeconomic History   Marital status: Single    Spouse name: Not on file   Number of children: 1   Years of education: 10   Highest education level: 10th grade  Occupational History   Not on file  Tobacco Use   Smoking status: Every Day    Packs/day: 0.50    Years: 8.00    Pack years: 4.00    Types: Cigarettes   Smokeless tobacco: Never  Vaping Use    Vaping Use: Every day   Substances: Nicotine, Flavoring  Substance and Sexual Activity   Alcohol use: Yes    Alcohol/week: 13.0 standard drinks    Types: 6 Cans of beer, 7 Standard drinks or equivalent per week    Comment: previously heavy drinker 2016. most recent 4 beer/ day drinker and none since 01-11-2020   Drug use: Yes    Frequency: 7.0 times per week    Types: Methamphetamines, Marijuana    Comment: used meth x 6 hours ago   Sexual activity: Not on file  Other Topics Concern   Not on file  Social History Narrative   Not on file   Social Determinants of Health   Financial Resource Strain: Not on file  Food Insecurity: Not on file  Transportation Needs: Not on file  Physical Activity: Not on file  Stress: Not on file  Social Connections: Not on file   Additional Social History:    Allergies:  No Known Allergies  Labs:  Results for orders placed or performed during the hospital encounter of 11/08/20 (from the past 48 hour(s))  Rapid urine drug screen (hospital performed)     Status: Abnormal   Collection Time: 11/08/20  8:53 PM  Result Value Ref Range   Opiates NONE DETECTED NONE DETECTED   Cocaine NONE DETECTED NONE DETECTED   Benzodiazepines NONE DETECTED NONE DETECTED   Amphetamines POSITIVE (A) NONE DETECTED   Tetrahydrocannabinol NONE DETECTED NONE DETECTED   Barbiturates NONE DETECTED NONE DETECTED    Comment: (NOTE) DRUG SCREEN FOR MEDICAL PURPOSES ONLY.  IF CONFIRMATION IS NEEDED FOR ANY PURPOSE, NOTIFY LAB WITHIN 5 DAYS.  LOWEST DETECTABLE LIMITS FOR URINE DRUG SCREEN Drug Class                     Cutoff (ng/mL) Amphetamine and metabolites    1000 Barbiturate and metabolites    200 Benzodiazepine                 200 Tricyclics and metabolites     300 Opiates and metabolites        300 Cocaine and metabolites        300 THC                            50 Performed at [facility], [address] [place], [place] [address]   Comprehensive  metabolic panel     Status: Abnormal   Collection Time: 11/08/20 10:15 PM  Result Value Ref Range   Sodium 138 135 - 145 mmol/L   Potassium 4.0 3.5 - 5.1 mmol/L   Chloride  100 98 - 111 mmol/L   CO2 27 22 - 32 mmol/L   Glucose, Bld 117 (H) 70 - 99 mg/dL    Comment: Glucose reference range applies only to samples taken after fasting for at least 8 hours.   BUN 15 6 - 20 mg/dL   Creatinine, Ser 5.09 0.61 - 1.24 mg/dL   Calcium 9.4 8.9 - 32.6 mg/dL   Total Protein 8.1 6.5 - 8.1 g/dL   Albumin 4.5 3.5 - 5.0 g/dL   AST 26 15 - 41 U/L   ALT 34 0 - 44 U/L   Alkaline Phosphatase 75 38 - 126 U/L   Total Bilirubin 1.9 (H) 0.3 - 1.2 mg/dL   GFR, Estimated >71 >24 mL/min    Comment: (NOTE) Calculated using the CKD-EPI Creatinine Equation (2021)    Anion gap 11 5 - 15    Comment: Performed at [facility], [address], [place], [place] [address]  Ethanol     Status: None   Collection Time: 11/08/20 10:15 PM  Result Value Ref Range   Alcohol, Ethyl (B) <10 <10 mg/dL    Comment: (NOTE) Lowest detectable limit for serum alcohol is 10 mg/dL.  For medical purposes only. Performed at [facility], [address], [place], [place] [address]   Salicylate level     Status: Abnormal   Collection Time: 11/08/20 10:15 PM  Result Value Ref Range   Salicylate Lvl <7.0 (L) 7.0 - 30.0 mg/dL    Comment: Performed at [facility], [address], [place], [place] [address]  Acetaminophen level     Status: Abnormal   Collection Time: 11/08/20 10:15 PM  Result Value Ref Range   Acetaminophen (Tylenol), Serum <10 (L) 10 - 30 ug/mL    Comment: (NOTE) Therapeutic concentrations vary significantly. A range of 10-30 ug/mL  may be an effective concentration for many patients. However, some  are best treated at concentrations outside of this range. Acetaminophen concentrations >150 ug/mL at 4 hours after ingestion  and >50 ug/mL at 12 hours after ingestion are often associated with  toxic  reactions.  Performed at [facility], [address], [place], [place] [address]   cbc     Status: Abnormal   Collection Time: 11/08/20 10:15 PM  Result Value Ref Range   WBC 11.1 (H) 4.0 - 10.5 K/uL   RBC 5.16 4.22 - 5.81 MIL/uL   Hemoglobin 14.9 13.0 - 17.0 g/dL   HCT 19.3 79.0 - 24.0 %   MCV 86.2 80.0 - 100.0 fL   MCH 28.9 26.0 - 34.0 pg   MCHC 33.5 30.0 - 36.0 g/dL   RDW 97.3 53.2 - 99.2 %   Platelets 368 150 - 400 K/uL   nRBC 0.0 0.0 - 0.2 %    Comment: Performed at [facility], [address], [place], [place] [address]    Medications:  No current facility-administered medications for this encounter.   Current Outpatient Medications  Medication Sig Dispense Refill   albuterol (VENTOLIN HFA) 108 (90 Base) MCG/ACT inhaler Inhale 2 puffs into the lungs every 4 (four) hours as needed for wheezing or shortness of breath.     QUEtiapine (SEROQUEL) 100 MG tablet Take 1 tablet (100 mg total) by mouth at bedtime. 30 tablet 0   sertraline (ZOLOFT) 50 MG tablet Take 1 tablet (50 mg total) by mouth at bedtime. 30 tablet 0   traZODone (DESYREL) 50 MG tablet Take 0.5 tablets (25 mg total) by mouth at bedtime as needed (  may give at least 1 hour after bedtime Seroquel dose given if needed for sleep). 30 tablet 0   hydrOXYzine (ATARAX/VISTARIL) 25 MG tablet Take 1 tablet (25 mg total) by mouth 3 (three) times daily as needed for anxiety. (Patient not taking: No sig reported) 30 tablet 0    Musculoskeletal: Strength & Muscle Tone: within normal limits Gait & Station: normal Patient leans: N/A          Psychiatric Specialty Exam:  Presentation  General Appearance: Appropriate for Environment  Eye Contact:Fair  Speech:Clear and Coherent; Normal Rate  Speech Volume:Normal  Handedness:Right   Mood and Affect  Mood:Depressed; Hopeless  Affect:Congruent   Thought Process  Thought Processes:Coherent; Goal Directed  Descriptions of  Associations:Intact  Orientation:Full (Time, Place and Person)  Thought Content:Logical  History of Schizophrenia/Schizoaffective disorder:No  Duration of Psychotic Symptoms:N/A  Hallucinations:No data recorded Ideas of Reference:None  Suicidal Thoughts:No data recorded Homicidal Thoughts:No data recorded  Sensorium  Memory:Immediate Good; Recent Good; Remote Good  Judgment:Poor  Insight:Fair   Executive Functions  Concentration:Fair  Attention Span:Fair  Recall:Fair  Fund of Knowledge:Fair  Language:Good   Psychomotor Activity  Psychomotor Activity: No data recorded  Assets  Assets:Communication Skills; Desire for Improvement; Physical Health   Sleep  Sleep: No data recorded   Physical Exam: Physical Exam Vitals and nursing note reviewed.  Cardiovascular:     Rate and Rhythm: Normal rate.  Neurological:     Mental Status: He is alert.  Psychiatric:        Attention and Perception: Attention normal.        Mood and Affect: Mood normal.        Speech: Speech normal.        Behavior: Behavior normal.        Thought Content: Thought content normal.        Judgment: Judgment normal.   Review of Systems  Psychiatric/Behavioral:  Positive for depression and substance abuse. Negative for suicidal ideas. The patient is nervous/anxious.   All other systems reviewed and are negative. Blood pressure 105/68, pulse 74, temperature 97.8 F (36.6 C), temperature source Oral, resp. rate 17, height 5\' 9"  (1.753 m), weight 96.6 kg, SpO2 99 %. Body mass index is 31.45 kg/m.   Disposition: No evidence of imminent risk to self or others at present.   Patient does not meet criteria for psychiatric inpatient admission. Refer to IOP. Discussed crisis plan, support from social network, calling 911, coming to the Emergency Department, and calling Suicide Hotline.  This service was provided via telemedicine using a 2-way, interactive audio and video  technology.  Names of all persons participating in this telemedicine service and their role in this encounter. Name: Haze JustinMickey Koch  Role: patient   Name: Nathan Koch Role: NP   Name:  Role:   Name:  Role:     Oneta Rackanika N Lanissa Cashen, NP 11/09/2020 11:18 AM

## 2020-11-10 ENCOUNTER — Other Ambulatory Visit: Payer: Self-pay

## 2020-11-10 ENCOUNTER — Emergency Department (HOSPITAL_COMMUNITY)
Admission: EM | Admit: 2020-11-10 | Discharge: 2020-11-10 | Disposition: A | Discharge status: Home / Self Care | Payer: Self-pay | Attending: Emergency Medicine | Admitting: Emergency Medicine

## 2020-11-10 DIAGNOSIS — F332 Major depressive disorder, recurrent severe without psychotic features: Secondary | ICD-10-CM | POA: Insufficient documentation

## 2020-11-10 DIAGNOSIS — F1510 Other stimulant abuse, uncomplicated: Secondary | ICD-10-CM | POA: Insufficient documentation

## 2020-11-10 DIAGNOSIS — J45909 Unspecified asthma, uncomplicated: Secondary | ICD-10-CM | POA: Insufficient documentation

## 2020-11-10 DIAGNOSIS — F17210 Nicotine dependence, cigarettes, uncomplicated: Secondary | ICD-10-CM | POA: Insufficient documentation

## 2020-11-10 DIAGNOSIS — R45851 Suicidal ideations: Secondary | ICD-10-CM | POA: Insufficient documentation

## 2020-11-10 DIAGNOSIS — F1910 Other psychoactive substance abuse, uncomplicated: Secondary | ICD-10-CM | POA: Insufficient documentation

## 2020-11-10 NOTE — Discharge Instructions (Addendum)
See the resource list below to help follow-up, there is a list of homeless shelters and psychiatric resources as well as locations of help for substance abuse  Substance Abuse Treatment Programs  Intensive Outpatient Programs [facility]     [address]      [place], [place]                   [phone]       [facility] [address] [place] [address] [place], [place] [phone]  [facility]     (Inpatient and outpatient)     [address]           [phone]    [facility] [phone] (Suboxone and Methadone)  [address]      [place], [place] [address]      [phone]       [address] [place], [place] [phone]  [facility] (Outpatient/Inpatient, Chemical)    (insurance only) [phone]             [facility] (Groups & Residential) [place], [place] [phone]     [facility]     [address]     [place], [place]      [phone]       [facility] (for caregivers and family) [phone] [address] Laurell Joseph[address]. [address] [place] [place], [place] [phone]      Residential Treatment Programs [facility]      [address] [place], [place] [address]  [phone]       [facility] [address] [place], [place] [address] [phone]  [facility] [place]        [phone]       [facility] [phone]  [facility] ([facility]             [address]                                         [place], [place]                                                [phone] or [phone]                               [facility] [address] [place], [address] [phone]  [facility]    [address]      [place], [place]     [phone]       [facility] [address] [place], [place] [phone]  [facility]   [address]  [place], [place] [address]     [phone]      Admissions: 8am-3pm M-F  [facility] ([facility]) [address] [place], [place] [phone]  [facility]: Residential Program [phone] Days)   [place], [place]      [phone] or [phone]     [facility]: [facility] [place], [place] (Walk in Hours over the weekend or by referral)  [facility] [address] [place], [place], [place] [address] [phone]  Crisis Mobile: Therapeutic  Alternatives:  [phone] (for crisis response 24 hours a day) [facility] Hotline:      [phone] Outpatient Psychiatry and Counseling  [facility]: Mobile Crisis Management 24 hours:  [phone]  [facility] of the [employer] sliding scale fee and walk in schedule: M-F 8am-12pm/1pm-3pm [address]  [place], [place] [address] [phone]  [facility] [address] [place], [place] [address] [phone]  [facility] (Formerly known as [facility] [employer])- new patient walk-in appointments available Monday - Friday 8am -3pm.          [address] [place], [place] [address] [phone] or crisis line- [phone]  [facility]/ Intensive Outpatient Therapy Program [address] [place], [place] [address] [phone]  [facility]                  Crisis Services      [phone] [address]     [place], [place] [address]                 [facility]   [facility] [phone]. [address] [place], [place] [address]   [facility] [facility]          [address]  [place], [place] [address]       [phone]  [facility] [address] [place], [place] [address] [phone]  [facility]    [address]    [place], [place]  [address]     [phone]       Andee Poles, MD     [address]     [place] [address]     [phone]       [facility] [address] [place] [address]  [facility]     [address] [place], [place]      [phone]       [facility] Eulogio Ditch, MD [address] [place], [place] [address] [phone]  [facility]     [address]     [place], [place] [address]     [phone]       [facility] [address] [place], [place] [address] [phone] Resources for Temporary Residential Assistance/Crisis Centers  [facility]) M-F 8am-3pm   [address] [place], [place] [address]   [phone] Services include: laundry, barbering, support groups, case management, phone  & computer access, showers, AA/NA mtgs, mental health/substance abuse nurse, job skills class, disability information, VA assistance, spiritual classes, etc.   HOMELESS SHELTERS  [facility]     [facility]   [address], [place]     [phone]              [employer] (women and children)       [address] [place], [place] [address] [phone] [email or web address] for application and process Application Required  [facility]   [address]    [place] [address]     [phone]                    [facility] [place] [address] [place], [place] [address] [phone] application appt.) Application Required  [employer] (women only)    [address]     [place], [place] [address]     [phone]      Intake starts 6pm daily Need valid ID, SSC, & Police report [employer] [address] [place], [place] [phone] Application Required  [employer] (men only)     [address]       [place], [place]     [phone]       [facility] (Pregnant women only) [address] [place], [place] [phone]  [facility]      [address]      [place], [place] [address]     [phone]             [facility] [address] [place], [place] [phone] 90 day commitment/SA/Application process  [facility](men only)     [address]     [place], [place]     [phone]       Check-in at [facility] [address] [place], [place] [address] [phone] Men/Women/Women and Children must be there by 7 pm  [facility] [place], [place] [phone]

## 2020-11-10 NOTE — ED Notes (Signed)
Pt given sprite 

## 2020-11-10 NOTE — ED Triage Notes (Signed)
Pt placed in vertical treatment area per Dr. Hyacinth Meeker. Security notified to come search pt.

## 2020-11-10 NOTE — ED Provider Notes (Addendum)
[facility] EMERGENCY DEPARTMENT Provider Note   CSN: [id number] Arrival date & time: 11/10/20  1001     History Chief Complaint  Patient presents with   Suicidal   Drug Problem    Nathan Koch is a 30 y.o. male.   Drug Problem   This patient is a 30 year old male, he has a known history of anxiety bipolar disorder methamphetamine abuse as well as a history of depression.  He presents to the hospital today stating that he is suicidal, states that he tried to walk into traffic yesterday but because it did not work he went to sleep in an abandoned house, he comes in today requesting another attempt at assistance.  He reports that his biggest concerns are that he is homeless he does not have a job and he wants help getting clean off of methamphetamine.  He was given resources yesterday when he was seen in the emergency department within the last 48 hours and states that he left the papers on his bed he did not call or even read them.  The patient denies active hallucinations but states that he does hear voices from time to time.  He was recently kicked out of his house by his fiance because of methamphetamine use and actually states that it was his fianc's mother.  He states his fiance also uses methamphetamine and is frustrated by the double standard and iron he of that situation.  He reports that he does not want to hurt himself because he has a child and does not want to do that to his child, but states he has been thinking about suicide because he is tired of dealing with his substance abuse.  The patient denies any physical complaints at this time.  Past Medical History:  Diagnosis Date   Anxiety    Anxiety disorder    Asthma    Bipolar 1 disorder ([name])    Depression    Methamphetamine abuse ([name])    PTSD (post-traumatic stress disorder)     Patient Active Problem List   Diagnosis Date Noted   MDD (major depressive disorder), recurrent episode, severe ([name]) 09/11/2020    MDD (major depressive disorder), recurrent severe, without psychosis ([name]) 09/10/2020   Suicidal ideation    Depression, major, recurrent, 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   Methamphetamine use disorder, moderate ([name]) 09/14/2019   MDD (major depressive disorder), recurrent, severe, with psychosis ([name]) 09/14/2019   MDD (major depressive disorder), single episode, severe with psychosis ([name]) 09/14/2019   Asthma 06/26/2014    Past Surgical History:  Procedure Laterality Date   CLOSED REDUCTION MANDIBLE N/A 07/02/2018   Procedure: CLOSED REDUCTION MANDIBULAR;  Surgeon: Vernie Murders, MD;  Location: [facility] ORS;  Service: ENT;  Laterality: N/A;       Family History  Problem Relation Age of Onset   Asthma Mother    Cancer Mother        breast cancer   Ulcers Mother    Cancer Father        esophogeal cancer   Hypertension Father    Asthma Brother     Social History   Tobacco Use   Smoking status: Every Day    Packs/day: 0.50    Years: 8.00    Pack years: 4.00    Types: Cigarettes   Smokeless tobacco: Never  Vaping Use   Vaping Use: Every day   Substances: Nicotine, Flavoring  Substance Use Topics   Alcohol use: Yes    Alcohol/week: 13.0  standard drinks    Types: 6 Cans of beer, 7 Standard drinks or equivalent per week    Comment: previously heavy drinker 2016. most recent 4 beer/ day drinker and none since 01-11-2020   Drug use: Yes    Frequency: 7.0 times per week    Types: Methamphetamines, Marijuana    Comment: used meth x 6 hours ago    Home Medications Prior to Admission medications   Medication Sig Start Date End Date Taking? Authorizing Provider  albuterol (VENTOLIN HFA) 108 (90 Base) MCG/ACT inhaler Inhale 2 puffs into the lungs every 4 (four) hours as needed for wheezing or shortness of breath.    [provider]  hydrOXYzine (ATARAX/VISTARIL) 25 MG tablet Take 1 tablet (25 mg total) by mouth 3 (three) times daily as needed for  anxiety. Patient not taking: No sig reported 10/14/20   Vanetta Mulders, NP  QUEtiapine (SEROQUEL) 100 MG tablet Take 1 tablet (100 mg total) by mouth at bedtime. 10/14/20   Vanetta Mulders, NP  sertraline (ZOLOFT) 50 MG tablet Take 1 tablet (50 mg total) by mouth at bedtime. 10/14/20   Vanetta Mulders, NP  traZODone (DESYREL) 50 MG tablet Take 0.5 tablets (25 mg total) by mouth at bedtime as needed (may give at least 1 hour after bedtime Seroquel dose given if needed for sleep). 10/14/20   Vanetta Mulders, NP    Allergies    Patient has no known allergies.  Review of Systems   Review of Systems  All other systems reviewed and are negative.  Physical Exam Updated Vital Signs BP 133/82   Pulse 79   Temp 98.4 F (36.9 C) (Oral)   Resp 16   Ht 1.753 m (5\' 9" )   Wt 96.6 kg   SpO2 95%   BMI 31.45 kg/m   Physical Exam Vitals and nursing note reviewed.  Constitutional:      General: He is not in acute distress.    Appearance: He is well-developed.  HENT:     Head: Normocephalic and atraumatic.     Mouth/Throat:     Pharynx: No oropharyngeal exudate.  Eyes:     General: No scleral icterus.       Right eye: No discharge.        Left eye: No discharge.     Conjunctiva/sclera: Conjunctivae normal.     Pupils: Pupils are equal, round, and reactive to light.  Neck:     Thyroid: No thyromegaly.     Vascular: No JVD.  Cardiovascular:     Rate and Rhythm: Normal rate and regular rhythm.     Heart sounds: Normal heart sounds. No murmur heard.   No friction rub. No gallop.  Pulmonary:     Effort: Pulmonary effort is normal. No respiratory distress.     Breath sounds: Normal breath sounds. No wheezing or rales.  Abdominal:     General: Bowel sounds are normal. There is no distension.     Palpations: Abdomen is soft. There is no mass.     Tenderness: There is no abdominal tenderness.  Musculoskeletal:        General: No tenderness. Normal range of motion.     Cervical  back: Normal range of motion and neck supple.  Lymphadenopathy:     Cervical: No cervical adenopathy.  Skin:    General: Skin is warm and dry.     Findings: No erythema or rash.  Neurological:     Mental Status: He is  alert.     Coordination: Coordination normal.  Psychiatric:        Behavior: Behavior normal.     Comments: The patient's affect does not match his comments.  He has a bizarre inappropriate smile when he talks, he does not appear overtly depressed or suicidal and is appearance, he is slightly disheveled.  He is not responding to internal stimuli at this time    ED Results / Procedures / Treatments   Labs (all labs ordered are listed, but only abnormal results are displayed) Labs Reviewed - No data to display  EKG None  Radiology No results found.  Procedures Procedures   Medications Ordered in ED Medications - No data to display  ED Course  I have reviewed the triage vital signs and the nursing notes.  Pertinent labs & imaging results that were available during my care of the patient were reviewed by me and considered in my medical decision making (see chart for details).    MDM Rules/Calculators/A&P                          Vital signs are unremarkable, this patient is chronically struggling with substance abuse and depression, claims that he is still suicidal and claims that he tried to walk into traffic yesterday, will obtain TTS consultation.  He is at this time medically cleared for evaluation and does not appear to be intoxicated to any degree.  Will hold on labs unless TTS feels he needs to be admitted.  This patient has been seen by the psychiatric consult service who agreed that this patient has secondary gain due to his homelessness, they do not believe he needs to be admitted to the hospital, I think this is reasonable, the patient does not give me a sense of needing to be placed for suicidality.  Final Clinical Impression(s) / ED Diagnoses Final  diagnoses:  Suicidal ideation  Polysubstance abuse ([name])     Eber Hong, MD 11/10/20 1147    Eber Hong, MD 11/10/20 [phone]
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
## 2020-11-10 NOTE — ED Triage Notes (Signed)
Pt presents today continues to complains of SI and drug use. Pt uses amphetamines, states last time he used was 4 days ago. Wants to go to a treatment center. Was seen here previously and d/c with plan to f/u at [facility] but pt states he doesn't have transportation. Pt reports SI, plan is to step out in front of traffic. Pt states he is homeless, hungry, thirsty, and tired.

## 2020-11-10 NOTE — ED Triage Notes (Signed)
Called from waiting room ,pt outside smoking. Did not respond

## 2020-11-10 NOTE — ED Notes (Signed)
Wanded/searched by security. Security bagged and labeled 2 open packs of cigarettes, a lighter, a comb,a ring with skull on it, silver coin from recovery meeting,  and a small plush animal. Placed in ED lockers

## 2020-11-10 NOTE — ED Notes (Signed)
Pt finished TTS assessment

## 2020-11-13 ENCOUNTER — Other Ambulatory Visit: Payer: Self-pay

## 2020-11-13 ENCOUNTER — Emergency Department
Admission: EM | Admit: 2020-11-13 | Discharge: 2020-11-15 | Disposition: A | Discharge status: Home / Self Care | Payer: No Payment, Other | Attending: Emergency Medicine | Admitting: Emergency Medicine

## 2020-11-13 DIAGNOSIS — R45851 Suicidal ideations: Secondary | ICD-10-CM | POA: Insufficient documentation

## 2020-11-13 DIAGNOSIS — J45909 Unspecified asthma, uncomplicated: Secondary | ICD-10-CM | POA: Insufficient documentation

## 2020-11-13 DIAGNOSIS — F332 Major depressive disorder, recurrent severe without psychotic features: Secondary | ICD-10-CM | POA: Diagnosis not present

## 2020-11-13 DIAGNOSIS — F323 Major depressive disorder, single episode, severe with psychotic features: Secondary | ICD-10-CM | POA: Insufficient documentation

## 2020-11-13 DIAGNOSIS — Z20822 Contact with and (suspected) exposure to covid-19: Secondary | ICD-10-CM | POA: Insufficient documentation

## 2020-11-13 DIAGNOSIS — F1520 Other stimulant dependence, uncomplicated: Secondary | ICD-10-CM | POA: Diagnosis present

## 2020-11-13 DIAGNOSIS — F17210 Nicotine dependence, cigarettes, uncomplicated: Secondary | ICD-10-CM | POA: Insufficient documentation

## 2020-11-13 DIAGNOSIS — Y900 Blood alcohol level of less than 20 mg/100 ml: Secondary | ICD-10-CM | POA: Insufficient documentation

## 2020-11-13 LAB — COMPREHENSIVE METABOLIC PANEL
ALT: 16 U/L (ref 0–44)
AST: 18 U/L (ref 15–41)
Albumin: 4 g/dL (ref 3.5–5.0)
Alkaline Phosphatase: 56 U/L (ref 38–126)
Anion gap: 8 (ref 5–15)
BUN: 7 mg/dL (ref 6–20)
CO2: 26 mmol/L (ref 22–32)
Calcium: 9.2 mg/dL (ref 8.9–10.3)
Chloride: 105 mmol/L (ref 98–111)
Creatinine, Ser: 0.81 mg/dL (ref 0.61–1.24)
GFR, Estimated: >60 mL/min (ref >60)
Glucose, Bld: 74 mg/dL (ref 70–99)
Potassium: 3.6 mmol/L (ref 3.5–5.1)
Sodium: 139 mmol/L (ref 135–145)
Total Bilirubin: 0.9 mg/dL (ref 0.3–1.2)
Total Protein: 7.1 g/dL (ref 6.5–8.1)

## 2020-11-13 LAB — CBC WITH DIFFERENTIAL/PLATELET
Abs Immature Granulocytes: 0.02 10*3/uL (ref 0.00–0.07)
Basophils Absolute: 0 10*3/uL (ref 0.0–0.1)
Basophils Relative: 0 %
Eosinophils Absolute: 0.2 10*3/uL (ref 0.0–0.5)
Eosinophils Relative: 3 %
HCT: 37.3 % — ABNORMAL LOW (ref 39.0–52.0)
Hemoglobin: 12.3 g/dL — ABNORMAL LOW (ref 13.0–17.0)
Immature Granulocytes: 0 %
Lymphocytes Relative: 26 %
Lymphs Abs: 2.2 10*3/uL (ref 0.7–4.0)
MCH: 28.3 pg (ref 26.0–34.0)
MCHC: 33 g/dL (ref 30.0–36.0)
MCV: 85.9 fL (ref 80.0–100.0)
Monocytes Absolute: 0.6 10*3/uL (ref 0.1–1.0)
Monocytes Relative: 7 %
Neutro Abs: 5.4 10*3/uL (ref 1.7–7.7)
Neutrophils Relative %: 64 %
Platelets: 286 10*3/uL (ref 150–400)
RBC: 4.34 MIL/uL (ref 4.22–5.81)
RDW: 14 % (ref 11.5–15.5)
WBC: 8.4 10*3/uL (ref 4.0–10.5)
nRBC: 0 % (ref 0.0–0.2)

## 2020-11-13 LAB — ETHANOL: Alcohol, Ethyl (B): <10 mg/dL (ref <10)

## 2020-11-13 MED ORDER — QUETIAPINE FUMARATE 25 MG PO TABS
100.0000 mg | ORAL_TABLET | Freq: Every day | ORAL | Status: DC
  Administered 2020-11-14 (×2): 100 mg via ORAL
  Filled 2020-11-13 (×2): qty 4

## 2020-11-13 MED ORDER — ALBUTEROL SULFATE HFA 108 (90 BASE) MCG/ACT IN AERS
2.0000 | INHALATION_SPRAY | RESPIRATORY_TRACT | Status: DC | PRN
Start: 2020-11-13 — End: 2020-11-15
  Administered 2020-11-14 (×3): 2 via RESPIRATORY_TRACT
  Filled 2020-11-13 (×2): qty 6.7

## 2020-11-13 MED ORDER — TRAZODONE HCL 50 MG PO TABS: 25.0000 mg | ORAL_TABLET | Freq: Every evening | ORAL | Status: DC | PRN

## 2020-11-13 MED ORDER — HYDROXYZINE HCL 25 MG PO TABS: 25.0000 mg | ORAL_TABLET | Freq: Three times a day (TID) | ORAL | Status: DC | PRN

## 2020-11-13 MED ORDER — SERTRALINE HCL 50 MG PO TABS
50.0000 mg | ORAL_TABLET | Freq: Every day | ORAL | Status: DC
Start: 2020-11-14 — End: 2020-11-15
  Administered 2020-11-14 (×2): 50 mg via ORAL
  Filled 2020-11-13 (×2): qty 1

## 2020-11-13 NOTE — ED Triage Notes (Signed)
Arrives from [facility] for Ed evaluation.  Patient states he is suicidal.  States he has a plan to cut his throat.  Has not collected a knife yet.  Patient is calm and cooperative.  NAD.  Placed in the center of Triage for safety.

## 2020-11-13 NOTE — ED Notes (Signed)
Belongings:  Silver ring, brown hat, grey shorts, black shirt, black sunglasses, green bracelet, and black phone.  Grey and black backpack also collected and labeled.

## 2020-11-13 NOTE — ED Provider Notes (Signed)
[facility] [place] [facility] Department Provider Note  ____________________________________________   Event Date/Time   First MD Initiated Contact with Patient 11/13/20 1934     (approximate)  I have reviewed the triage vital signs and the nursing notes.   HISTORY  Chief Complaint Suicidal    HPI Nathan Koch is a 30 y.o. male with bipolar, substance abuse who comes in with concerns from [facility] for being suicidal with a plan to cut his throat.  Has not actually collected a knife.  Patient states that he started having his feelings today, constant, nothing makes it better, nothing makes it worse.  Reports that he came to [place] to try to get help with his substance abuse but he has a court date on Friday so they were not able to take him to the substance abuse center and that made him really upset that he is here trying to get help.  Reports that he feels like he should not be left alone due to the concern for SI.  He reports last using 8 days ago.  States that he uses meth.  Denies any other medical concerns.          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   Methamphetamine use disorder, moderate ([name]) 09/14/2019   MDD (major depressive disorder), recurrent, severe, with psychosis ([name]) 09/14/2019   MDD (major depressive disorder), single episode, severe with psychosis ([name]) 09/14/2019   Asthma 06/26/2014    Past Surgical History:  Procedure Laterality Date   CLOSED REDUCTION MANDIBLE N/A  07/02/2018   Procedure: CLOSED REDUCTION MANDIBULAR;  Surgeon: Vernie Murders, MD;  Location: [facility] ORS;  Service: ENT;  Laterality: N/A;    Prior to Admission medications   Medication Sig Start Date End Date Taking? Authorizing Provider  albuterol (VENTOLIN HFA) 108 (90 Base) MCG/ACT inhaler Inhale 2 puffs into the lungs every 4 (four) hours as needed for wheezing or shortness of breath.    [provider]  hydrOXYzine (ATARAX/VISTARIL) 25 MG tablet Take 1 tablet (25 mg total) by mouth 3 (three) times daily as needed for anxiety. Patient not taking: No sig reported 10/14/20   Vanetta Mulders, NP  QUEtiapine (SEROQUEL) 100 MG tablet Take 1 tablet (100 mg total) by mouth at bedtime. 10/14/20   Vanetta Mulders, NP  sertraline (ZOLOFT) 50 MG tablet Take 1 tablet (50 mg total) by mouth at bedtime. 10/14/20   Vanetta Mulders, NP  traZODone (DESYREL) 50 MG tablet Take 0.5 tablets (25 mg total) by mouth at bedtime as needed (may give at least 1 hour after bedtime Seroquel dose given if needed for sleep). 10/14/20   Vanetta Mulders, NP    Allergies Patient has no known allergies.  Family History  Problem Relation Age of Onset   Asthma Mother    Cancer Mother        breast cancer   Ulcers Mother    Cancer Father        esophogeal  cancer   Hypertension Father    Asthma Brother     Social History Social History   Tobacco Use   Smoking status: Every Day    Packs/day: 0.50    Years: 8.00    Pack years: 4.00    Types: Cigarettes   Smokeless tobacco: Never  Vaping Use   Vaping Use: Every day   Substances: Nicotine, Flavoring  Substance Use Topics   Alcohol use: Yes    Alcohol/week: 13.0 standard drinks    Types: 6 Cans of beer, 7 Standard drinks or equivalent per week    Comment: previously heavy drinker 2016. most recent 4 beer/ day drinker and none since 01-11-2020   Drug use: Yes    Frequency: 7.0 times per week    Types: Methamphetamines, Marijuana    Comment: used  meth x 6 hours ago      Review of Systems Constitutional: No fever/chills Eyes: No visual changes. ENT: No sore throat. Cardiovascular: Denies chest pain. Respiratory: Denies shortness of breath. Gastrointestinal: No abdominal pain.  No nausea, no vomiting.  No diarrhea.  No constipation. Genitourinary: Negative for dysuria. Musculoskeletal: Negative for back pain. Skin: Negative for rash. Neurological: Negative for headaches, focal weakness or numbness. Psych: SI, substance abuse All other ROS negative ____________________________________________   PHYSICAL EXAM:  VITAL SIGNS: ED Triage Vitals  Enc Vitals Group     BP 11/13/20 1906 126/85     Pulse Rate 11/13/20 1906 75     Resp 11/13/20 1906 16     Temp 11/13/20 1906 98.6 F (37 C)     Temp Source 11/13/20 1906 Oral     SpO2 11/13/20 1906 99 %     Weight 11/13/20 1902 210 lb (95.3 kg)     Height 11/13/20 1902 5\' 9"  (1.753 m)     Head Circumference --      Peak Flow --      Pain Score 11/13/20 1902 0     Pain Loc --      Pain Edu? --      Excl. in GC? --     Constitutional: Alert and oriented. Well appearing and in no acute distress. Eyes: Conjunctivae are normal. No swelling around eyes Head: Atraumatic. Nose: No congestion/rhinnorhea. Mouth/Throat: Mucous membranes are moist.   Neck: No stridor. Trachea Midline. FROM Cardiovascular: Normal rate, no swelling noted Respiratory: No increased wob, no stridor Gastrointestinal: Soft and nontender. No distention. No abdominal bruits.  Musculoskeletal: No lower extremity tenderness nor edema.  No joint effusions. Neurologic:  Normal speech and language. No gross focal neurologic deficits are appreciated.  Skin:  Skin is warm, dry and intact. No rash noted. Psychiatric: Positive SI, no HI, hallucinations GU: Deferred   ____________________________________________   LABS (all labs ordered are listed, but only abnormal results are displayed)  Labs Reviewed  CBC  WITH DIFFERENTIAL/PLATELET - Abnormal; Notable for the following components:      Result Value   Hemoglobin 12.3 (*)    HCT 37.3 (*)    All other components within normal limits  RESP PANEL BY RT-PCR (FLU A&B, COVID) ARPGX2  COMPREHENSIVE METABOLIC PANEL  ETHANOL  URINE DRUG SCREEN, QUALITATIVE ([facility] ONLY)   ____________________________________________   PROCEDURES  Procedure(s) performed (including Critical Care):  Procedures   ____________________________________________   INITIAL IMPRESSION / ASSESSMENT AND PLAN / ED COURSE  Nathan Koch was evaluated in Emergency Department on 11/13/2020 for the symptoms described in the history of present illness. He was evaluated in  the context of the global COVID-19 pandemic, which necessitated consideration that the patient might be at risk for infection with the SARS-CoV-2 virus that causes COVID-19. Institutional protocols and algorithms that pertain to the evaluation of patients at risk for COVID-19 are in a state of rapid change based on information released by regulatory bodies including the CDC and federal and state organizations. These policies and algorithms were followed during the patient's care in the ED.    Pt is without any acute medical complaints. No exam findings to suggest medical cause of current presentation. Will order psychiatric screening labs and discuss further w/ psychiatric service.  Given patient has SI with a plan will IVC patient    D/d includes but is not limited to psychiatric disease, behavioral/personality disorder, inadequate socioeconomic support, medical.  Based on HPI, exam, unremarkable labs, no concern for acute medical problem at this time. No rigidity, clonus, hyperthermia, focal neurologic deficit, diaphoresis, tachycardia, meningismus, ataxia, gait abnormality or other finding to suggest this visit represents a non-psychiatric problem. Screening labs reviewed.    Given this, pt medically cleared,  to be dispositioned per Psych.    The patient has been placed in psychiatric observation due to the need to provide a safe environment for the patient while obtaining psychiatric consultation and evaluation, as well as ongoing medical and medication management to treat the patient's condition.  The patient has been placed under full IVC at this time.        ____________________________________________   FINAL CLINICAL IMPRESSION(S) / ED DIAGNOSES   Final diagnoses:  Suicidal ideation      MEDICATIONS GIVEN DURING THIS VISIT:  Medications - No data to display   ED Discharge Orders     None        Note:  This document was prepared using Dragon voice recognition software and may include unintentional dictation errors.    Concha Se, MD 11/13/20 2037

## 2020-11-13 NOTE — ED Notes (Signed)
Pt given cup of gingerale °

## 2020-11-13 NOTE — ED Notes (Signed)
Patient belongings removed and bagged by Kara Mead, ED NT.  Patient placed in hospital provided scrubs and socks. Patient calm, cooperative in triage.

## 2020-11-13 NOTE — ED Notes (Signed)
Lab at bedside

## 2020-11-13 NOTE — ED Notes (Signed)
Black shoes also placed in belongings bag.

## 2020-11-14 LAB — URINE DRUG SCREEN, QUALITATIVE (ARMC ONLY)
Amphetamines, Ur Screen: NOT DETECTED
Barbiturates, Ur Screen: NOT DETECTED
Benzodiazepine, Ur Scrn: NOT DETECTED
Cannabinoid 50 Ng, Ur ~~LOC~~: NOT DETECTED
Cocaine Metabolite,Ur ~~LOC~~: NOT DETECTED
MDMA (Ecstasy)Ur Screen: NOT DETECTED
Methadone Scn, Ur: NOT DETECTED
Opiate, Ur Screen: NOT DETECTED
Phencyclidine (PCP) Ur S: NOT DETECTED
Tricyclic, Ur Screen: NOT DETECTED

## 2020-11-14 LAB — SALICYLATE LEVEL: Salicylate Lvl: <7 mg/dL — ABNORMAL LOW (ref 7.0–30.0)

## 2020-11-14 LAB — ACETAMINOPHEN LEVEL: Acetaminophen (Tylenol), Serum: <10 ug/mL — ABNORMAL LOW (ref 10–30)

## 2020-11-14 LAB — RESP PANEL BY RT-PCR (FLU A&B, COVID) ARPGX2
Influenza A by PCR: NEGATIVE
Influenza B by PCR: NEGATIVE
SARS Coronavirus 2 by RT PCR: NEGATIVE

## 2020-11-14 NOTE — ED Notes (Signed)
Pt provided with dinner tray at this time.

## 2020-11-14 NOTE — BH Assessment (Signed)
PATIENT BED AVAILABLE AFTER 8AM ON 11/15/20  Patient has been accepted to [facility].  Patient assigned to [facility] Accepting physician is Dr. Estill Cotta.  Call report to [phone].  Representative was Pax.   ER Staff is aware of it:  [facility] ER Secretary  Dr. Larinda Buttery, ER MD  Elana Patient's Nurse

## 2020-11-14 NOTE — ED Notes (Signed)
Pt returned from shower and eating lunch.

## 2020-11-14 NOTE — Consult Note (Signed)
[facility] Face-to-Face Psychiatry Consult   Reason for Consult: Consult for 30 year old man with a history of substance abuse and depression who came to the emergency room with suicidal ideation Referring Physician: Roxan Hockey Patient Identification: Nathan Koch MRN:  [id number] Principal Diagnosis: MDD (major depressive disorder), recurrent severe, without psychosis ([name]) Diagnosis:  Principal Problem:   MDD (major depressive disorder), recurrent severe, without psychosis ([name]) Active Problems:   Methamphetamine use disorder, moderate ([name])   Suicidal ideation   MDD (major depressive disorder), recurrent episode, severe ([name])   Total Time spent with patient: 1 hour  Subjective:   Nathan Koch is a 30 y.o. male patient admitted with "suicidal".  HPI: Patient seen chart reviewed.  Patient seen in previous admissions as well.  30 year old man with a history of amphetamine abuse and depression.  He says he is now 8 days clean without any drug use.  He had come to town [facility] but [facility] him away when they found out he had a court date coming up Friday.  [facility].  Still feeling very depressed.  Active suicidal ideation.  Thought about walking into traffic.  Denies hallucinations or current psychotic symptoms.  Says he has been compliant with prescribed medicine.  Past Psychiatric History: Past history of presentations for substance abuse usually with amphetamines and for depression.  Prior diagnoses have been considered of possible bipolar disorder although mostly looks like depression when he is not on drugs  Risk to Self:   Risk to Others:   Prior Inpatient Therapy:   Prior Outpatient Therapy:    Past Medical History:  Past Medical History:  Diagnosis Date   Anxiety    Anxiety disorder    Asthma    Bipolar 1 disorder ([name])    Depression    Methamphetamine abuse ([name])    PTSD (post-traumatic stress  disorder)     Past Surgical History:  Procedure Laterality Date   CLOSED REDUCTION MANDIBLE N/A 07/02/2018   Procedure: CLOSED REDUCTION MANDIBULAR;  Surgeon: Vernie Murders, MD;  Location: [facility] ORS;  Service: ENT;  Laterality: N/A;   Family History:  Family History  Problem Relation Age of Onset   Asthma Mother    Cancer Mother        breast cancer   Ulcers Mother    Cancer Father        esophogeal cancer   Hypertension Father    Asthma Brother    Family Psychiatric  History: Previous Social History:  Social History   Substance and Sexual Activity  Alcohol Use Yes   Alcohol/week: 13.0 standard drinks   Types: 6 Cans of beer, 7 Standard drinks or equivalent per week   Comment: previously heavy drinker 2016. most recent 4 beer/ day drinker and none since 01-11-2020     Social History   Substance and Sexual Activity  Drug Use Yes   Frequency: 7.0 times per week   Types: Methamphetamines, Marijuana   Comment: used meth x 6 hours ago    Social History   Socioeconomic History   Marital status: Single    Spouse name: Not on file   Number of children: 1   Years of education: 10   Highest education level: 10th grade  Occupational History   Not on file  Tobacco Use   Smoking status: Every Day    Packs/day: 0.50    Years: 8.00    Pack years: 4.00  Types: Cigarettes   Smokeless tobacco: Never  Vaping Use   Vaping Use: Every day   Substances: Nicotine, Flavoring  Substance and Sexual Activity   Alcohol use: Yes    Alcohol/week: 13.0 standard drinks    Types: 6 Cans of beer, 7 Standard drinks or equivalent per week    Comment: previously heavy drinker 2016. most recent 4 beer/ day drinker and none since 01-11-2020   Drug use: Yes    Frequency: 7.0 times per week    Types: Methamphetamines, Marijuana    Comment: used meth x 6 hours ago   Sexual activity: Not on file  Other Topics Concern   Not on file  Social History Narrative   Not on file   Social  Determinants of Health   Financial Resource Strain: Not on file  Food Insecurity: Not on file  Transportation Needs: Not on file  Physical Activity: Not on file  Stress: Not on file  Social Connections: Not on file   Additional Social History:    Allergies:  No Known Allergies  Labs:  Results for orders placed or performed during the hospital encounter of 11/13/20 (from the past 48 hour(s))  Resp Panel by RT-PCR (Flu A&B, Covid) Nasopharyngeal Swab     Status: None   Collection Time: 11/13/20  7:11 PM   Specimen: Nasopharyngeal Swab; Nasopharyngeal(NP) swabs in vial transport medium  Result Value Ref Range   SARS Coronavirus 2 by RT PCR NEGATIVE NEGATIVE    Comment: (NOTE) SARS-CoV-2 target nucleic acids are NOT DETECTED.  The SARS-CoV-2 RNA is generally detectable in upper respiratory specimens during the acute phase of infection. The lowest concentration of SARS-CoV-2 viral copies this assay can detect is 138 copies/mL. A negative result does not preclude SARS-Cov-2 infection and should not be used as the sole basis for treatment or other patient management decisions. A negative result may occur with  improper specimen collection/handling, submission of specimen other than nasopharyngeal swab, presence of viral mutation(s) within the areas targeted by this assay, and inadequate number of viral copies(<138 copies/mL). A negative result must be combined with clinical observations, patient history, and epidemiological information. The expected result is Negative.  Fact Sheet for Patients:  BloggerCourse.com  Fact Sheet for Healthcare Providers:  SeriousBroker.it  This test is no t yet approved or cleared by the [place] FDA and  has been authorized for detection and/or diagnosis of SARS-CoV-2 by FDA under an Emergency Use Authorization (EUA). This EUA will remain  in effect (meaning this test can be used) for the  duration of the COVID-19 declaration under Section 564(b)(1) of the Act, 21 U.S.C.section 360bbb-3(b)(1), unless the authorization is terminated  or revoked sooner.       Influenza A by PCR NEGATIVE NEGATIVE   Influenza B by PCR NEGATIVE NEGATIVE    Comment: (NOTE) The Xpert Xpress SARS-CoV-2/FLU/RSV plus assay is intended as an aid in the diagnosis of influenza from Nasopharyngeal swab specimens and should not be used as a sole basis for treatment. Nasal washings and aspirates are unacceptable for Xpert Xpress SARS-CoV-2/FLU/RSV testing.  Fact Sheet for Patients: BloggerCourse.com  Fact Sheet for Healthcare Providers: SeriousBroker.it  This test is not yet approved or cleared by the [place] FDA and has been authorized for detection and/or diagnosis of SARS-CoV-2 by FDA under an Emergency Use Authorization (EUA). This EUA will remain in effect (meaning this test can be used) for the duration of the COVID-19 declaration under Section 564(b)(1) of the Act, 21 U.S.C.  section 360bbb-3(b)(1), unless the authorization is terminated or revoked.  Performed at [facility], [address] [place], [place] [address]   Comprehensive metabolic panel     Status: None   Collection Time: 11/13/20  7:20 PM  Result Value Ref Range   Sodium 139 135 - 145 mmol/L   Potassium 3.6 3.5 - 5.1 mmol/L   Chloride 105 98 - 111 mmol/L   CO2 26 22 - 32 mmol/L   Glucose, Bld 74 70 - 99 mg/dL    Comment: Glucose reference range applies only to samples taken after fasting for at least 8 hours.   BUN 7 6 - 20 mg/dL   Creatinine, Ser 6.040.81 0.61 - 1.24 mg/dL   Calcium 9.2 8.9 - 54.010.3 mg/dL   Total Protein 7.1 6.5 - 8.1 g/dL   Albumin 4.0 3.5 - 5.0 g/dL   AST 18 15 - 41 U/L   ALT 16 0 - 44 U/L   Alkaline Phosphatase 56 38 - 126 U/L   Total Bilirubin 0.9 0.3 - 1.2 mg/dL   GFR, Estimated >98>60 >11>60 mL/min    Comment: (NOTE) Calculated using  the CKD-EPI Creatinine Equation (2021)    Anion gap 8 5 - 15    Comment: Performed at [name], [address] [place], [place] [address]  Ethanol     Status: None   Collection Time: 11/13/20  7:20 PM  Result Value Ref Range   Alcohol, Ethyl (B) <10 <10 mg/dL    Comment: (NOTE) Lowest detectable limit for serum alcohol is 10 mg/dL.  For medical purposes only. Performed at [facility], [address] [place], [place] [address]   CBC with Diff     Status: Abnormal   Collection Time: 11/13/20  7:20 PM  Result Value Ref Range   WBC 8.4 4.0 - 10.5 K/uL   RBC 4.34 4.22 - 5.81 MIL/uL   Hemoglobin 12.3 (L) 13.0 - 17.0 g/dL   HCT 13.037.3 (L) 86.539.0 - 78.452.0 %   MCV 85.9 80.0 - 100.0 fL   MCH 28.3 26.0 - 34.0 pg   MCHC 33.0 30.0 - 36.0 g/dL   RDW 69.614.0 29.511.5 - 28.415.5 %   Platelets 286 150 - 400 K/uL   nRBC 0.0 0.0 - 0.2 %   Neutrophils Relative % 64 %   Neutro Abs 5.4 1.7 - 7.7 K/uL   Lymphocytes Relative 26 %   Lymphs Abs 2.2 0.7 - 4.0 K/uL   Monocytes Relative 7 %   Monocytes Absolute 0.6 0.1 - 1.0 K/uL   Eosinophils Relative 3 %   Eosinophils Absolute 0.2 0.0 - 0.5 K/uL   Basophils Relative 0 %   Basophils Absolute 0.0 0.0 - 0.1 K/uL   Immature Granulocytes 0 %   Abs Immature Granulocytes 0.02 0.00 - 0.07 K/uL    Comment: Performed at [facility], [address] [place], [place] [address]  Salicylate level     Status: Abnormal   Collection Time: 11/13/20  7:20 PM  Result Value Ref Range   Salicylate Lvl <7.0 (L) 7.0 - 30.0 mg/dL    Comment: Performed at [facility], [address] [place], [place] [address]  Acetaminophen level     Status: Abnormal   Collection Time: 11/13/20  7:20 PM  Result Value Ref Range   Acetaminophen (Tylenol), Serum <10 (L) 10 - 30 ug/mL    Comment: (NOTE) Therapeutic concentrations vary significantly. A range of 10-30 ug/mL  may be an  effective concentration for many patients. However, some  are best  treated at concentrations outside of this range. Acetaminophen concentrations >150 ug/mL at 4 hours after ingestion  and >50 ug/mL at 12 hours after ingestion are often associated with  toxic reactions.  Performed at [facility], [address] [place], [place] [address]   Urine Drug Screen, Qualitative     Status: None   Collection Time: 11/14/20  1:30 AM  Result Value Ref Range   Tricyclic, Ur Screen NONE DETECTED NONE DETECTED   Amphetamines, Ur Screen NONE DETECTED NONE DETECTED   MDMA (Ecstasy)Ur Screen NONE DETECTED NONE DETECTED   Cocaine Metabolite,Ur [place] NONE DETECTED NONE DETECTED   Opiate, Ur Screen NONE DETECTED NONE DETECTED   Phencyclidine (PCP) Ur S NONE DETECTED NONE DETECTED   Cannabinoid 50 Ng, Ur [place] NONE DETECTED NONE DETECTED   Barbiturates, Ur Screen NONE DETECTED NONE DETECTED   Benzodiazepine, Ur Scrn NONE DETECTED NONE DETECTED   Methadone Scn, Ur NONE DETECTED NONE DETECTED    Comment: (NOTE) Tricyclics + metabolites, urine    Cutoff 1000 ng/mL Amphetamines + metabolites, urine  Cutoff 1000 ng/mL MDMA (Ecstasy), urine              Cutoff 500 ng/mL Cocaine Metabolite, urine          Cutoff 300 ng/mL Opiate + metabolites, urine        Cutoff 300 ng/mL Phencyclidine (PCP), urine         Cutoff 25 ng/mL Cannabinoid, urine                 Cutoff 50 ng/mL Barbiturates + metabolites, urine  Cutoff 200 ng/mL Benzodiazepine, urine              Cutoff 200 ng/mL Methadone, urine                   Cutoff 300 ng/mL  The urine drug screen provides only a preliminary, unconfirmed analytical test result and should not be used for non-medical purposes. Clinical consideration and professional judgment should be applied to any positive drug screen result due to possible interfering substances. A more specific alternate chemical method must be used in order to obtain a confirmed analytical result. Gas chromatography / mass spectrometry (GC/MS) is the  preferred confirm atory method. Performed at [facility] [address] [place], [place] [address]     Current Facility-Administered Medications  Medication Dose Route Frequency Provider Last Rate Last Admin   albuterol (VENTOLIN HFA) 108 (90 Base) MCG/ACT inhaler 2 puff  2 puff Inhalation Q4H PRN Ward, Kristen N, DO   2 puff at 11/14/20 1101   hydrOXYzine (ATARAX/VISTARIL) tablet 25 mg  25 mg Oral TID PRN Ward, Layla Maw, DO       QUEtiapine (SEROQUEL) tablet 100 mg  100 mg Oral QHS Ward, Kristen N, DO   100 mg at 11/14/20 0125   sertraline (ZOLOFT) tablet 50 mg  50 mg Oral QHS Ward, Kristen N, DO   50 mg at 11/14/20 0126   traZODone (DESYREL) tablet 25 mg  25 mg Oral QHS PRN Ward, Layla Maw, DO       Current Outpatient Medications  Medication Sig Dispense Refill   albuterol (VENTOLIN HFA) 108 (90 Base) MCG/ACT inhaler Inhale 2 puffs into the lungs every 4 (four) hours as needed for wheezing or shortness of breath.     QUEtiapine (SEROQUEL) 100 MG tablet Take 1 tablet (100 mg total) by mouth  at bedtime. 30 tablet 0   sertraline (ZOLOFT) 50 MG tablet Take 1 tablet (50 mg total) by mouth at bedtime. 30 tablet 0   traZODone (DESYREL) 50 MG tablet Take 0.5 tablets (25 mg total) by mouth at bedtime as needed (may give at least 1 hour after bedtime Seroquel dose given if needed for sleep). 30 tablet 0   hydrOXYzine (ATARAX/VISTARIL) 25 MG tablet Take 1 tablet (25 mg total) by mouth 3 (three) times daily as needed for anxiety. (Patient not taking: No sig reported) 30 tablet 0    Musculoskeletal: Strength & Muscle Tone: within normal limits Gait & Station: normal Patient leans: N/A            Psychiatric Specialty Exam:  Presentation  General Appearance: Appropriate for Environment  Eye Contact:Fair  Speech:Clear and Coherent  Speech Volume:Normal  Handedness:Right   Mood and Affect  Mood:Depressed; Anxious; Irritable  Affect:Congruent   Thought  Process  Thought Processes:Coherent; Goal Directed  Descriptions of Associations:Intact  Orientation:Full (Time, Place and Person)  Thought Content:Logical  History of Schizophrenia/Schizoaffective disorder:No  Duration of Psychotic Symptoms:N/A  Hallucinations:Hallucinations: None  Ideas of Reference:None  Suicidal Thoughts:Suicidal Thoughts: Yes, Passive SI Passive Intent and/or Plan: With Intent; With Plan  Homicidal Thoughts:Homicidal Thoughts: No   Sensorium  Memory:Immediate Good; Recent Good; Remote Good  Judgment:Fair  Insight:Fair   Executive Functions  Concentration:Good  Attention Span:Good  Recall:Good  Fund of Knowledge:Good  Language:Good   Psychomotor Activity  Psychomotor Activity:Psychomotor Activity: Normal   Assets  Assets:Communication Skills; Desire for Improvement; Housing; Health and safety inspector; Resilience; Social Support   Sleep  Sleep:Sleep: Poor Number of Hours of Sleep: 4   Physical Exam: Physical Exam Vitals and nursing note reviewed.  Constitutional:      Appearance: Normal appearance.  HENT:     Head: Normocephalic and atraumatic.     Mouth/Throat:     Pharynx: Oropharynx is clear.  Eyes:     Pupils: Pupils are equal, round, and reactive to light.  Cardiovascular:     Rate and Rhythm: Normal rate and regular rhythm.  Pulmonary:     Effort: Pulmonary effort is normal.     Breath sounds: Normal breath sounds.  Abdominal:     General: Abdomen is flat.     Palpations: Abdomen is soft.  Musculoskeletal:        General: Normal range of motion.  Skin:    General: Skin is warm and dry.  Neurological:     General: No focal deficit present.     Mental Status: He is alert. Mental status is at baseline.  Psychiatric:        Mood and Affect: Mood is depressed.        Speech: Speech is delayed.        Behavior: Behavior is slowed.        Thought Content: Thought content includes suicidal ideation. Thought  content includes suicidal plan.   Review of Systems  Constitutional: Negative.   HENT: Negative.    Eyes: Negative.   Respiratory: Negative.    Cardiovascular: Negative.   Gastrointestinal: Negative.   Musculoskeletal: Negative.   Skin: Negative.   Neurological: Negative.   Psychiatric/Behavioral:  Positive for depression and suicidal ideas.   Blood pressure 118/74, pulse (!) 56, temperature 97.7 F (36.5 C), temperature source Oral, resp. rate 18, height 5\' 9"  (1.753 m), weight 95.3 kg, SpO2 97 %. Body mass index is 31.01 kg/m.  Treatment Plan Summary: Medication management and Plan 30 year old man  with a history of major depression.  Still on Zoloft Seroquel and trazodone.  Suicidal ideation with thoughts of walking out into traffic.  Patient appears depressed and withdrawn.  Drug screen is negative tending to support his history of staying sober.  Patient meets criteria for inpatient hospitalization.  No beds available currently.  Patient will be referred out to other hospitals for possible admission and continued on current medicine.  Case reviewed with TTS and ER physician  Disposition: Recommend psychiatric Inpatient admission when medically cleared. Supportive therapy provided about ongoing stressors.  Mordecai Rasmussen, MD 11/14/2020 12:30 PM

## 2020-11-14 NOTE — BH Assessment (Signed)
Comprehensive Clinical Assessment (CCA) Note  11/14/2020 ADALID BECKMANN [id number] Recommendations for Services/Supports/Treatments: Psych NP Annice Pih T. pt is recommended for overnight observation and reassessment.  Pt seen with "I'm suicidal today." Pt presented with linear, coherent speech. Pt's thoughts were relevant and goal oriented. Pt was not responding to internal/external stimuli. Pt avoided making eye contact. Pt had a depressed mood and an anxious affect. Pt reported that he'd not used substances in eight days. Pt was expansive on feeling despondent about being homeless. Pt reported that he is not allowed to stay in the shelter in [place] and the hospitals are not helping him. Pt stated, "Every time I want help, something up and happens." Pt explained that his thoughts of SI were intensified today. Pt was noted to become agitated and visibly anxious when describing his plight. Pt was unable to identify a support system. Pt admitted that he has a court date on Friday 11/15/20.  Pt had fair insight and judgement. The pt expressed a motivation to remain clean, however he needs help finding sober-living. The pt endorsed SI with a plan to use a razor blade to cut his throat. The patient denied current HI or AV/H.    Chief Complaint:  Chief Complaint  Patient presents with   Suicidal   Visit Diagnosis: Methamphetamine use disorder, moderate ([name])   MDD (major depressive disorder), recurrent, severe, with psychosis ([name])   MDD (major depressive disorder), single episode, severe with psychosis ([name])   Depression, major, recurrent, severe with psychosis ([name])   Suicidal ideation   MDD (major depressive disorder), recurrent severe, without psychosis ([name])   MDD (major depressive disorder), recurrent episode, severe ([name])    CCA Screening, Triage and Referral (STR)  Patient Reported Information How did you hear about [place]? Self  Referral name: Transported to [facility] by police  Referral  phone number: 0 (N/A)   Whom do you see for routine medical problems? I don't have a doctor  Practice/Facility Name: No data recorded Practice/Facility Phone Number: No data recorded Name of Contact: No data recorded Contact Number: No data recorded Contact Fax Number: No data recorded Prescriber Name: No data recorded Prescriber Address (if known): No data recorded  What Is the Reason for Your Visit/Call Today? Continued SI, despondency, homelessness  How Long Has This Been Causing You Problems? > than 6 months  What Do You Feel Would Help You the Most Today? Treatment for Depression or other mood problem   Have You Recently Been in Any Inpatient Treatment (Hospital/Detox/Crisis Center/28-Day Program)? Yes  Name/Location of Program/Hospital:[facility]  How Long Were You There? 5 days  When Were You Discharged? 09/14/20   Have You Ever Received Services From [employer] Before? Yes  Who Do You See at [facility]? Various providers at [facility], [facility], [facility]   Have You Recently Had Any Thoughts About Hurting Yourself? Yes  Are You Planning to Commit Suicide/Harm Yourself At This time? Yes   Have you Recently Had Thoughts About Hurting Someone [name]? No  Explanation: No data recorded  Have You Used Any Alcohol or Drugs in the Past 24 Hours? No  How Long Ago Did You Use Drugs or Alcohol? No data recorded What Did You Use and How Much? Pt used meth four days ago per report.   Do You Currently Have a Therapist/Psychiatrist? No  Name of Therapist/Psychiatrist: Pt previously seen at [facility]; has not been there in three months; referred two days ago   Have You Been Recently Discharged  From Any Public relations account executive or Programs? No  Explanation of Discharge From Practice/Program: No data recorded    CCA Screening Triage Referral Assessment Type of Contact: Face-to-Face  Is this Initial or Reassessment? Initial Assessment  Date Telepsych consult ordered in  [facility]:  11/10/20  Time Telepsych consult ordered in [facility]:  2315   Patient Reported Information Reviewed? Yes  Patient Left Without Being Seen? No data recorded Reason for Not Completing Assessment: No data recorded  Collateral Involvement: None   Does Patient Have a Court Appointed Legal Guardian? No data recorded Name and Contact of Legal Guardian: Self  If Minor and Not Living with Parent(s), Who has Custody? N/A  Is CPS involved or ever been involved? Never  Is APS involved or ever been involved? Never   Patient Determined To Be At Risk for Harm To Self or Others Based on Review of Patient Reported Information or Presenting Complaint? No  Method: No data recorded Availability of Means: No data recorded Intent: No data recorded Notification Required: No data recorded Additional Information for Danger to Others Potential: No data recorded Additional Comments for Danger to Others Potential: No data recorded Are There Guns or Other Weapons in Your Home? No data recorded Types of Guns/Weapons: No data recorded Are These Weapons Safely Secured?                            No data recorded Who Could Verify You Are Able To Have These Secured: No data recorded Do You Have any Outstanding Charges, Pending Court Dates, Parole/Probation? No data recorded Contacted To Inform of Risk of Harm To Self or Others: Other: Comment (Pt cannot curently contract for safety.)   Location of Assessment: [facility]   Does Patient Present under Involuntary Commitment? Yes  IVC Papers Initial File Date: 11/13/20   [place] of Residence: [place]   Patient Currently Receiving the Following Services: Medication Management   Determination of Need: Emergent (2 hours)   Options For Referral: -- (Overnight observation)     CCA Biopsychosocial Intake/Chief Complaint:  Pt was found by police today passed out.  He endorsed suicidal ideation and current use of meth.  Current Symptoms/Problems:  Pt endorsed suicidal ideation and use of 1 gram of meth today   Patient Reported Schizophrenia/Schizoaffective Diagnosis in Past: No   Strengths: Self-awareness  Preferences: Not assessed  Abilities: Not assessed   Type of Services Patient Feels are Needed: Pt requested inpatient treatment   Initial Clinical Notes/Concerns: Pt endorsed current SI without specific plan or intent; recent substance use.  Pt denied homicidal ideation, hallucination, self-injurious behavior (said he cut about a month ago).   Mental Health Symptoms Depression:   Hopelessness; Worthlessness; Increase/decrease in appetite; Fatigue   Duration of Depressive symptoms:  Greater than two weeks   Mania:   None   Anxiety:    Worrying   Psychosis:   None (Pt has endorsed a history of hallucinations)   Duration of Psychotic symptoms:  N/A   Trauma:   None   Obsessions:   None   Compulsions:   None   Inattention:   None   Hyperactivity/Impulsivity:   N/A   Oppositional/Defiant Behaviors:   None   Emotional Irregularity:   Potentially harmful impulsivity; Mood lability; Recurrent suicidal behaviors/gestures/threats; Intense/unstable relationships   Other Mood/Personality Symptoms:   None noted    Mental Status Exam Appearance and self-care  Stature:   Average   Weight:  Average weight   Clothing:   Casual   Grooming:   Normal   Cosmetic use:   None   Posture/gait:   Normal   Motor activity:   Not Remarkable   Sensorium  Attention:   Normal   Concentration:   Normal   Orientation:   X5   Recall/memory:   Normal   Affect and Mood  Affect:   Appropriate   Mood:   Depressed   Relating  Eye contact:   Normal   Facial expression:   Responsive   Attitude toward examiner:   Cooperative   Thought and Language  Speech flow:  Clear and Coherent   Thought content:   Appropriate to Mood and Circumstances   Preoccupation:   Suicide    Hallucinations:   None   Organization:  No data recorded  [employer] of Knowledge:   Average   Intelligence:   Average   Abstraction:   Normal   Judgement:   Poor   Reality Testing:   Realistic   Insight:   Fair   Decision Making:   Impulsive   Social Functioning  Social Maturity:   Impulsive   Social Judgement:   "Street Smart"   Stress  Stressors:   Armed forces operational officer; Housing; Grief/losses; Relationship   Coping Ability:   Overwhelmed   Skill Deficits:   None   Supports:   Support needed     Religion:    Leisure/Recreation:    Exercise/Diet:     CCA Employment/Education Employment/Work Situation:    Education:     CCA Family/Childhood History Family and Relationship History:    Childhood History:     Child/Adolescent Assessment:     CCA Substance Use Alcohol/Drug Use:                           ASAM's:  Six Dimensions of Multidimensional Assessment  Dimension 1:  Acute Intoxication and/or Withdrawal Potential:      Dimension 2:  Biomedical Conditions and Complications:      Dimension 3:  Emotional, Behavioral, or Cognitive Conditions and Complications:     Dimension 4:  Readiness to Change:     Dimension 5:  Relapse, Continued use, or Continued Problem Potential:     Dimension 6:  Recovery/Living Environment:     ASAM Severity Score:    ASAM Recommended Level of Treatment:     Substance use Disorder (SUD)    Recommendations for Services/Supports/Treatments:    DSM5 Diagnoses: Patient Active Problem List   Diagnosis Date Noted   MDD (major depressive disorder), recurrent episode, severe ([name]) 09/11/2020   MDD (major depressive disorder), recurrent severe, without psychosis ([name]) 09/10/2020   Suicidal ideation    Depression, major, recurrent, 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   Methamphetamine use disorder, moderate ([name]) 09/14/2019   MDD (major depressive disorder), recurrent, severe,  with psychosis ([name]) 09/14/2019   MDD (major depressive disorder), single episode, severe with psychosis ([name]) 09/14/2019   Asthma 06/26/2014   Kaisley Stiverson R Daleena Rotter, LCAS

## 2020-11-14 NOTE — ED Notes (Signed)
INVOLUNTARY continues to await placement °

## 2020-11-14 NOTE — ED Notes (Signed)
Pt given shower supplies and lunch tray.

## 2020-11-14 NOTE — BH Assessment (Signed)
Referral information for Psychiatric Hospitalization faxed to:  [facility] ([phone]-or- [phone]),   [facility] ([phone]---[phone]),  [facility] ([phone], [phone], [phone] or [phone]),   [place] ([phone] or [phone])  [facility] ([phone]),   [facility] ([phone] -or- [phone]),   [facility] ([phone]).  [facility] ([phone])  

## 2020-11-14 NOTE — ED Notes (Signed)
Pt requesting to use phone. Pt provided with phone and reminded of phone call hours. Pt verbalizes understanding that he will have to give phone back in 10 minutes.

## 2020-11-14 NOTE — ED Provider Notes (Signed)
Emergency Medicine Observation Re-evaluation Note  Nathan Koch is a 30 y.o. male, seen on rounds today.  Pt initially presented to the ED for complaints of Suicidal Currently, the patient is sleeping.  Physical Exam  BP 126/85   Pulse 75   Temp 98.6 F (37 C) (Oral)   Resp 16   Ht 5\' 9"  (1.753 m)   Wt 95.3 kg   SpO2 99%   BMI 31.01 kg/m  Physical Exam Gen: No acute distress  Resp: Normal rise and fall of chest Neuro: Moving all four extremities Psych: Resting currently, calm and cooperative when awake    ED Course / MDM  EKG:   I have reviewed the labs performed to date as well as medications administered while in observation.  Recent changes in the last 24 hours include no acute events overnight.  Plan  Current plan is for psychiatric evaluation in the morning for further disposition. is under involuntary commitment.      Genevie Elman, [name], DO 11/14/20 [phone]

## 2020-11-14 NOTE — ED Notes (Signed)
Pt given breakfast tray and juice at this time. Pt asked for extra tray, informed pt there were no extra trays at this time. No other needs voiced.

## 2020-11-14 NOTE — ED Notes (Signed)
Pt requesting ordered PRN inhaler. Requested med from pharmacy.

## 2020-11-14 NOTE — ED Notes (Signed)
Pt given cup of gingerale °

## 2020-11-14 NOTE — ED Notes (Signed)
Pt returned phone without difficulty. Pt back to room without incident.

## 2020-11-14 NOTE — Consult Note (Signed)
[facility] Face-to-Face Psychiatry Consult   Reason for Consult:Suicidal   Referring Physician: Dr. Fuller Plan Patient Identification: [name] MRN:  [id number] Principal Diagnosis: <principal problem not specified> Diagnosis:  Active Problems:   Methamphetamine use disorder, moderate ([name])   MDD (major depressive disorder), recurrent, severe, with psychosis ([name])   MDD (major depressive disorder), single episode, severe with psychosis ([name])   Depression, major, recurrent, severe with psychosis ([name])   Suicidal ideation   MDD (major depressive disorder), recurrent severe, without psychosis ([name])   MDD (major depressive disorder), recurrent episode, severe ([name])   Total Time spent with patient: 1 hour  Subjective: "I have been clean for 8 days.  I just needs some help." Nathan Koch is a 30 y.o. male patient presented to [facility] ED via [facility] voluntarily initially, and Dr. Fuller Plan placed the patient under involuntary commitment status (IVC). On this visit, the patient states, "I have been trying to get help, but no one is willing to help me in [facility]."  The patient shared, "did not give an F about no one."  From the last visit at [facility], he shared that he is currently followed by psychiatry at [facility] Dr. Leona Singleton, where he is prescribed Zoloft Seroquel and trazodone.  States he has not seen his outpatient provider in the past three months. He stated he recently completed residential treatment with [facility] 9 months prior. Reports he is hopeful to find residential treatment closer to [facility] or [facility]. The patient was seen face-to-face by this provider; the chart was reviewed and consulted with Dr. Fuller Plan on 11/13/2020 due to the patient's care. It was discussed with the EDP that the patient remained under observation overnight and will be reassessed in the a.m. to determine if he meets the criteria for psychiatric inpatient admission; he could be discharged home. On evaluation, the patient is  alert and oriented x 4, calm, but gets upset when he discusses not being able to find services. He remains cooperative and mood-congruent with affect.  The patient does not appear to be responding to internal or external stimuli. Neither is the patient presenting with any delusional thinking. The patient denies auditory or visual hallucinations. The patient admits to suicidal ideations to cut his "neck with a razor blade." He denies homicidal or self-harm ideations. The patient is not presenting with any psychotic or paranoid behaviors. During an encounter with the patient, he could answer questions appropriately.  HPI: Per Dr. Fuller Plan, Nathan Koch is a 30 y.o. male with bipolar, substance abuse who comes in with concerns from [facility] for being suicidal with a plan to cut his throat.  Has not actually collected a knife.  Patient states that he started having his feelings today, constant, nothing makes it better, nothing makes it worse.  Reports that he came to [place] to try to get help with his substance abuse but he has a court date on Friday so they were not able to take him to the substance abuse center and that made him really upset that he is here trying to get help.  Reports that he feels like he should not be left alone due to the concern for SI.  He reports last using 8 days ago.  States that he uses meth.  Denies any other medical concerns.  Past Psychiatric History:   Anxiety    Anxiety disorder     Bipolar 1 disorder ([name])   Depression   Methamphetamine abuse ([name])   PTSD (post-traumatic stress disorder)  Risk to Self:   Risk to Others:   Prior Inpatient Therapy:   Prior Outpatient Therapy:    Past Medical History:  Past Medical History:  Diagnosis Date   Anxiety    Anxiety disorder    Asthma    Bipolar 1 disorder ([name])    Depression    Methamphetamine abuse ([name])    PTSD (post-traumatic stress disorder)     Past Surgical History:  Procedure Laterality Date   CLOSED  REDUCTION MANDIBLE N/A 07/02/2018   Procedure: CLOSED REDUCTION MANDIBULAR;  Surgeon: Vernie Murders, MD;  Location: [facility] ORS;  Service: ENT;  Laterality: N/A;   Family History:  Family History  Problem Relation Age of Onset   Asthma Mother    Cancer Mother        breast cancer   Ulcers Mother    Cancer Father        esophogeal cancer   Hypertension Father    Asthma Brother    Family Psychiatric  History:  Social History:  Social History   Substance and Sexual Activity  Alcohol Use Yes   Alcohol/week: 13.0 standard drinks   Types: 6 Cans of beer, 7 Standard drinks or equivalent per week   Comment: previously heavy drinker 2016. most recent 4 beer/ day drinker and none since 01-11-2020     Social History   Substance and Sexual Activity  Drug Use Yes   Frequency: 7.0 times per week   Types: Methamphetamines, Marijuana   Comment: used meth x 6 hours ago    Social History   Socioeconomic History   Marital status: Single    Spouse name: Not on file   Number of children: 1   Years of education: 10   Highest education level: 10th grade  Occupational History   Not on file  Tobacco Use   Smoking status: Every Day    Packs/day: 0.50    Years: 8.00    Pack years: 4.00    Types: Cigarettes   Smokeless tobacco: Never  Vaping Use   Vaping Use: Every day   Substances: Nicotine, Flavoring  Substance and Sexual Activity   Alcohol use: Yes    Alcohol/week: 13.0 standard drinks    Types: 6 Cans of beer, 7 Standard drinks or equivalent per week    Comment: previously heavy drinker 2016. most recent 4 beer/ day drinker and none since 01-11-2020   Drug use: Yes    Frequency: 7.0 times per week    Types: Methamphetamines, Marijuana    Comment: used meth x 6 hours ago   Sexual activity: Not on file  Other Topics Concern   Not on file  Social History Narrative   Not on file   Social Determinants of Health   Financial Resource Strain: Not on file  Food Insecurity: Not on  file  Transportation Needs: Not on file  Physical Activity: Not on file  Stress: Not on file  Social Connections: Not on file   Additional Social History:    Allergies:  No Known Allergies  Labs:  Results for orders placed or performed during the hospital encounter of 11/13/20 (from the past 48 hour(s))  Comprehensive metabolic panel     Status: None   Collection Time: 11/13/20  7:20 PM  Result Value Ref Range   Sodium 139 135 - 145 mmol/L   Potassium 3.6 3.5 - 5.1 mmol/L   Chloride 105 98 - 111 mmol/L   CO2 26 22 - 32 mmol/L   Glucose, Bld  74 70 - 99 mg/dL    Comment: Glucose reference range applies only to samples taken after fasting for at least 8 hours.   BUN 7 6 - 20 mg/dL   Creatinine, Ser 1.610.81 0.61 - 1.24 mg/dL   Calcium 9.2 8.9 - 09.610.3 mg/dL   Total Protein 7.1 6.5 - 8.1 g/dL   Albumin 4.0 3.5 - 5.0 g/dL   AST 18 15 - 41 U/L   ALT 16 0 - 44 U/L   Alkaline Phosphatase 56 38 - 126 U/L   Total Bilirubin 0.9 0.3 - 1.2 mg/dL   GFR, Estimated >04>60 >54>60 mL/min    Comment: (NOTE) Calculated using the CKD-EPI Creatinine Equation (2021)    Anion gap 8 5 - 15    Comment: Performed at [facility], [address] [place], [place] [address]  Ethanol     Status: None   Collection Time: 11/13/20  7:20 PM  Result Value Ref Range   Alcohol, Ethyl (B) <10 <10 mg/dL    Comment: (NOTE) Lowest detectable limit for serum alcohol is 10 mg/dL.  For medical purposes only. Performed at [facility], [address] [place], [place] [address]   CBC with Diff     Status: Abnormal   Collection Time: 11/13/20  7:20 PM  Result Value Ref Range   WBC 8.4 4.0 - 10.5 K/uL   RBC 4.34 4.22 - 5.81 MIL/uL   Hemoglobin 12.3 (L) 13.0 - 17.0 g/dL   HCT 29.537.3 (L) 62.139.0 - 30.852.0 %   MCV 85.9 80.0 - 100.0 fL   MCH 28.3 26.0 - 34.0 pg   MCHC 33.0 30.0 - 36.0 g/dL   RDW 65.714.0 84.611.5 - 96.215.5 %   Platelets 286 150 - 400 K/uL   nRBC 0.0 0.0 - 0.2 %   Neutrophils Relative % 64 %   Neutro  Abs 5.4 1.7 - 7.7 K/uL   Lymphocytes Relative 26 %   Lymphs Abs 2.2 0.7 - 4.0 K/uL   Monocytes Relative 7 %   Monocytes Absolute 0.6 0.1 - 1.0 K/uL   Eosinophils Relative 3 %   Eosinophils Absolute 0.2 0.0 - 0.5 K/uL   Basophils Relative 0 %   Basophils Absolute 0.0 0.0 - 0.1 K/uL   Immature Granulocytes 0 %   Abs Immature Granulocytes 0.02 0.00 - 0.07 K/uL    Comment: Performed at [facility], [address] [place], [place] [address]    Current Facility-Administered Medications  Medication Dose Route Frequency Provider Last Rate Last Admin   albuterol (VENTOLIN HFA) 108 (90 Base) MCG/ACT inhaler 2 puff  2 puff Inhalation Q4H PRN Ward, Kristen N, DO       hydrOXYzine (ATARAX/VISTARIL) tablet 25 mg  25 mg Oral TID PRN Ward, Kristen N, DO       QUEtiapine (SEROQUEL) tablet 100 mg  100 mg Oral QHS Ward, Kristen N, DO       sertraline (ZOLOFT) tablet 50 mg  50 mg Oral QHS Ward, Kristen N, DO       traZODone (DESYREL) tablet 25 mg  25 mg Oral QHS PRN Ward, Layla MawKristen N, DO       Current Outpatient Medications  Medication Sig Dispense Refill   albuterol (VENTOLIN HFA) 108 (90 Base) MCG/ACT inhaler Inhale 2 puffs into the lungs every 4 (four) hours as needed for wheezing or shortness of breath.     QUEtiapine (SEROQUEL) 100 MG tablet Take 1 tablet (100 mg total) by mouth at bedtime.  30 tablet 0   sertraline (ZOLOFT) 50 MG tablet Take 1 tablet (50 mg total) by mouth at bedtime. 30 tablet 0   traZODone (DESYREL) 50 MG tablet Take 0.5 tablets (25 mg total) by mouth at bedtime as needed (may give at least 1 hour after bedtime Seroquel dose given if needed for sleep). 30 tablet 0   hydrOXYzine (ATARAX/VISTARIL) 25 MG tablet Take 1 tablet (25 mg total) by mouth 3 (three) times daily as needed for anxiety. (Patient not taking: No sig reported) 30 tablet 0    Musculoskeletal: Strength & Muscle Tone: within normal limits Gait & Station: normal Patient leans: N/A  Psychiatric Specialty  Exam:  Presentation  General Appearance: Appropriate for Environment  Eye Contact:Fair  Speech:Clear and Coherent  Speech Volume:Normal  Handedness:Right   Mood and Affect  Mood:Depressed; Anxious; Irritable  Affect:Congruent   Thought Process  Thought Processes:Coherent; Goal Directed  Descriptions of Associations:Intact  Orientation:Full (Time, Place and Person)  Thought Content:Logical  History of Schizophrenia/Schizoaffective disorder:No  Duration of Psychotic Symptoms:N/A  Hallucinations:Hallucinations: None  Ideas of Reference:None  Suicidal Thoughts:Suicidal Thoughts: Yes, Passive SI Passive Intent and/or Plan: With Intent; With Plan  Homicidal Thoughts:Homicidal Thoughts: No   Sensorium  Memory:Immediate Good; Recent Good; Remote Good  Judgment:Fair  Insight:Fair   Executive Functions  Concentration:Good  Attention Span:Good  Recall:Good  Fund of Knowledge:Good  Language:Good   Psychomotor Activity  Psychomotor Activity:Psychomotor Activity: Normal   Assets  Assets:Communication Skills; Desire for Improvement; Housing; Health and safety inspector; Resilience; Social Support   Sleep  Sleep:Sleep: Poor Number of Hours of Sleep: 4   Physical Exam: Physical Exam Vitals and nursing note reviewed.  Constitutional:      Appearance: Normal appearance. He is normal weight.  HENT:     Head: Normocephalic and atraumatic.     Nose: Nose normal.  Cardiovascular:     Rate and Rhythm: Normal rate.     Pulses: Normal pulses.  Pulmonary:     Effort: Pulmonary effort is normal.  Musculoskeletal:        General: Normal range of motion.     Cervical back: Normal range of motion and neck supple.  Neurological:     General: No focal deficit present.     Mental Status: He is alert and oriented to person, place, and time. Mental status is at baseline.  Psychiatric:        Attention and Perception: Attention and perception normal.         Mood and Affect: Mood is anxious and depressed.        Speech: Speech normal.        Behavior: Behavior normal. Behavior is cooperative.        Thought Content: Thought content normal.        Cognition and Memory: Cognition and memory normal.        Judgment: Judgment normal.   Review of Systems  Psychiatric/Behavioral:  Positive for depression and suicidal ideas. The patient is nervous/anxious and has insomnia.   All other systems reviewed and are negative. Blood pressure 126/85, pulse 75, temperature 98.6 F (37 C), temperature source Oral, resp. rate 16, height  (1.753 m), weight 95.3 kg, SpO2 99 %. Body mass index is 31.01 kg/m.  Treatment Plan Summary: Daily contact with patient to assess and evaluate symptoms and progress in treatment and Plan The patient remained under observation overnight and will be reassessed in the a.m. to determine if he meets the criteria for psychiatric inpatient admission; he  could be discharged home.  Disposition: No evidence of imminent risk to self or others at present.   Supportive therapy provided about ongoing stressors. Refer to IOP.  Gillermo Murdoch, NP 11/14/2020 12:35 AM

## 2020-11-14 NOTE — ED Notes (Signed)
Graham crackers and ginger ale provided to pt at this time.

## 2020-11-15 MED ORDER — QUETIAPINE FUMARATE 25 MG PO TABS: 100.0000 mg | ORAL_TABLET | Freq: Every day | ORAL | Status: DC

## 2020-11-15 NOTE — ED Notes (Signed)
IVC/ Accepted to [facility] after 8 am

## 2020-11-15 NOTE — ED Notes (Signed)
Pt provided with ginger ale at this time.

## 2020-11-15 NOTE — ED Notes (Signed)
Pt ambulated to BR with steady gait.

## 2020-11-15 NOTE — ED Notes (Signed)
Report received from Elana, RN. Pt resting quietly in recliner with eyes closed. Respirations even and unlabored. No S/S of distress noted.

## 2020-11-15 NOTE — ED Notes (Signed)
EMTALA reviewed by this RN.  

## 2020-11-30 ENCOUNTER — Encounter (HOSPITAL_COMMUNITY): Payer: Self-pay | Admitting: *Deleted

## 2020-11-30 ENCOUNTER — Emergency Department (HOSPITAL_COMMUNITY)
Admission: EM | Admit: 2020-11-30 | Discharge: 2020-12-01 | Disposition: A | Discharge status: Home / Self Care | Payer: Self-pay | Attending: Emergency Medicine | Admitting: Emergency Medicine

## 2020-11-30 ENCOUNTER — Other Ambulatory Visit: Payer: Self-pay

## 2020-11-30 DIAGNOSIS — Y900 Blood alcohol level of less than 20 mg/100 ml: Secondary | ICD-10-CM | POA: Insufficient documentation

## 2020-11-30 DIAGNOSIS — F419 Anxiety disorder, unspecified: Secondary | ICD-10-CM | POA: Insufficient documentation

## 2020-11-30 DIAGNOSIS — J45909 Unspecified asthma, uncomplicated: Secondary | ICD-10-CM | POA: Insufficient documentation

## 2020-11-30 DIAGNOSIS — F17210 Nicotine dependence, cigarettes, uncomplicated: Secondary | ICD-10-CM | POA: Insufficient documentation

## 2020-11-30 DIAGNOSIS — F1510 Other stimulant abuse, uncomplicated: Secondary | ICD-10-CM | POA: Insufficient documentation

## 2020-11-30 DIAGNOSIS — R45851 Suicidal ideations: Secondary | ICD-10-CM

## 2020-11-30 DIAGNOSIS — F332 Major depressive disorder, recurrent severe without psychotic features: Secondary | ICD-10-CM | POA: Diagnosis present

## 2020-11-30 DIAGNOSIS — F1520 Other stimulant dependence, uncomplicated: Secondary | ICD-10-CM | POA: Diagnosis present

## 2020-11-30 LAB — CBC WITH DIFFERENTIAL/PLATELET
Abs Immature Granulocytes: 0.02 10*3/uL (ref 0.00–0.07)
Basophils Absolute: 0 10*3/uL (ref 0.0–0.1)
Basophils Relative: 0 %
Eosinophils Absolute: 0.2 10*3/uL (ref 0.0–0.5)
Eosinophils Relative: 2 %
HCT: 38 % — ABNORMAL LOW (ref 39.0–52.0)
Hemoglobin: 12.7 g/dL — ABNORMAL LOW (ref 13.0–17.0)
Immature Granulocytes: 0 %
Lymphocytes Relative: 25 %
Lymphs Abs: 2.2 10*3/uL (ref 0.7–4.0)
MCH: 28.9 pg (ref 26.0–34.0)
MCHC: 33.4 g/dL (ref 30.0–36.0)
MCV: 86.4 fL (ref 80.0–100.0)
Monocytes Absolute: 0.6 10*3/uL (ref 0.1–1.0)
Monocytes Relative: 7 %
Neutro Abs: 5.8 10*3/uL (ref 1.7–7.7)
Neutrophils Relative %: 66 %
Platelets: 276 10*3/uL (ref 150–400)
RBC: 4.4 MIL/uL (ref 4.22–5.81)
RDW: 14.2 % (ref 11.5–15.5)
WBC: 8.8 10*3/uL (ref 4.0–10.5)
nRBC: 0 % (ref 0.0–0.2)

## 2020-11-30 LAB — RAPID URINE DRUG SCREEN, HOSP PERFORMED
Amphetamines: NOT DETECTED
Barbiturates: NOT DETECTED
Benzodiazepines: NOT DETECTED
Cocaine: NOT DETECTED
Opiates: NOT DETECTED
Tetrahydrocannabinol: NOT DETECTED

## 2020-11-30 LAB — URINALYSIS, ROUTINE W REFLEX MICROSCOPIC
Bilirubin Urine: NEGATIVE
Glucose, UA: NEGATIVE mg/dL
Hgb urine dipstick: NEGATIVE
Ketones, ur: NEGATIVE mg/dL
Leukocytes,Ua: NEGATIVE
Nitrite: NEGATIVE
Protein, ur: NEGATIVE mg/dL
Specific Gravity, Urine: 1.016 (ref 1.005–1.030)
pH: 7 (ref 5.0–8.0)

## 2020-11-30 LAB — ETHANOL: Alcohol, Ethyl (B): <10 mg/dL (ref <10)

## 2020-11-30 LAB — COMPREHENSIVE METABOLIC PANEL
ALT: 18 U/L (ref 0–44)
AST: 15 U/L (ref 15–41)
Albumin: 4 g/dL (ref 3.5–5.0)
Alkaline Phosphatase: 69 U/L (ref 38–126)
Anion gap: 6 (ref 5–15)
BUN: 8 mg/dL (ref 6–20)
CO2: 26 mmol/L (ref 22–32)
Calcium: 8.7 mg/dL — ABNORMAL LOW (ref 8.9–10.3)
Chloride: 106 mmol/L (ref 98–111)
Creatinine, Ser: 0.78 mg/dL (ref 0.61–1.24)
GFR, Estimated: >60 mL/min (ref >60)
Glucose, Bld: 99 mg/dL (ref 70–99)
Potassium: 3.8 mmol/L (ref 3.5–5.1)
Sodium: 138 mmol/L (ref 135–145)
Total Bilirubin: 0.9 mg/dL (ref 0.3–1.2)
Total Protein: 7.1 g/dL (ref 6.5–8.1)

## 2020-11-30 LAB — SALICYLATE LEVEL: Salicylate Lvl: <7 mg/dL — ABNORMAL LOW (ref 7.0–30.0)

## 2020-11-30 LAB — ACETAMINOPHEN LEVEL: Acetaminophen (Tylenol), Serum: <10 ug/mL — ABNORMAL LOW (ref 10–30)

## 2020-11-30 MED ORDER — TRAZODONE HCL 50 MG PO TABS
25.0000 mg | ORAL_TABLET | Freq: Every evening | ORAL | Status: DC | PRN
  Administered 2020-11-30: 25 mg via ORAL
  Filled 2020-11-30: qty 1

## 2020-11-30 MED ORDER — SERTRALINE HCL 50 MG PO TABS
50.0000 mg | ORAL_TABLET | Freq: Every day | ORAL | Status: DC
  Administered 2020-11-30: 50 mg via ORAL
  Filled 2020-11-30: qty 1

## 2020-11-30 MED ORDER — QUETIAPINE FUMARATE 100 MG PO TABS
100.0000 mg | ORAL_TABLET | Freq: Every day | ORAL | Status: DC
Start: 2020-11-30 — End: 2020-12-01
  Administered 2020-11-30: 100 mg via ORAL
  Filled 2020-11-30: qty 1

## 2020-11-30 MED ORDER — ALBUTEROL SULFATE HFA 108 (90 BASE) MCG/ACT IN AERS
2.0000 | INHALATION_SPRAY | Freq: Once | RESPIRATORY_TRACT | Status: AC
  Administered 2020-11-30: 2 via RESPIRATORY_TRACT
  Filled 2020-11-30: qty 6.7

## 2020-11-30 NOTE — ED Notes (Signed)
Pt changed into burgundy scrubs and belongings placed in locker.

## 2020-11-30 NOTE — ED Notes (Signed)
[facility] made aware pt is medically cleared and ready for eval

## 2020-11-30 NOTE — ED Triage Notes (Signed)
Pt with SI x 2 days with no plan.  Denies HI.  Pt states 23 days clean from meth.  Denies ETOH use.  Pt upset in triage, wife and pt not getting along and not able to see his son much recently.  Pt states he is homeless currently.  Pt had to leave the house where wife lives. Wife is encouraging pt to come here and pt admits to not wanting to come today.
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
## 2020-11-30 NOTE — BH Assessment (Signed)
TTS will complete pt assessment once he is medically cleared.

## 2020-11-30 NOTE — ED Notes (Signed)
Per [facility] pt is to stay over night and be reevaluated tomorrow

## 2020-11-30 NOTE — ED Provider Notes (Signed)
[facility] EMERGENCY DEPARTMENT Provider Note   CSN: [id number] Arrival date & time: 11/30/20  1343     History Chief Complaint  Patient presents with   V70.1    Nathan Koch is a 30 y.o. male.  HPI      Nathan Koch is a 30 y.o. male with past medical history of anxiety, depression, bipolar disorder, methamphetamine abuse and PTSD.  He presents to the Emergency Department complaining of suicidal ideation.  He states that he has been 23 days clean from meth.  He is currently homeless.  He states that he is not getting along currently with his wife and that he is here because his wife was concerned that he may harm himself.  He admits to walking out into the street today.  He has been here multiple times recently for suicidal ideation.  He states he feels like he is not getting the care that he needs.  He denies any recent drug use, alcohol use, hallucinations or homicidal ideation.    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   Methamphetamine use disorder, moderate ([name]) 09/14/2019   MDD (major depressive disorder), recurrent, severe, with psychosis ([name]) 09/14/2019   MDD (major depressive disorder), single episode, severe with psychosis ([name]) 09/14/2019   Asthma 06/26/2014    Past Surgical History:  Procedure Laterality Date   CLOSED REDUCTION MANDIBLE N/A 07/02/2018   Procedure: CLOSED REDUCTION MANDIBULAR;  Surgeon: Vernie Murders, MD;  Location: [facility] ORS;  Service: ENT;  Laterality: N/A;        Family History  Problem Relation Age of Onset   Asthma Mother    Cancer Mother        breast cancer   Ulcers Mother    Cancer Father        esophogeal cancer   Hypertension Father    Asthma Brother     Social History   Tobacco Use   Smoking status: Every Day    Packs/day: 0.50    Years: 8.00    Pack years: 4.00    Types: Cigarettes   Smokeless tobacco: Never  Vaping Use   Vaping Use: Every day   Substances: Nicotine, Flavoring  Substance Use Topics   Alcohol use: Not Currently    Alcohol/week: 13.0 standard drinks    Types: 6 Cans of beer, 7 Standard drinks or equivalent per week    Comment: previously heavy drinker 2016. most recent 4 beer/ day drinker and none since 01-11-2020   Drug use: Not Currently    Types: Methamphetamines, Marijuana    Comment: last used 23 days ago per pt    Home Medications Prior to Admission medications   Medication Sig Start Date End Date Taking? Authorizing Provider  albuterol (VENTOLIN HFA) 108 (90 Base) MCG/ACT inhaler Inhale 2 puffs into the lungs every 4 (four) hours as needed for wheezing or shortness of breath.    [provider]  hydrOXYzine (ATARAX/VISTARIL) 25 MG tablet Take 1 tablet (25 mg total)  by mouth 3 (three) times daily as needed for anxiety. Patient not taking: No sig reported 10/14/20   Vanetta Mulders, NP  QUEtiapine (SEROQUEL) 100 MG tablet Take 1 tablet (100 mg total) by mouth at bedtime. 10/14/20   Vanetta Mulders, NP  sertraline (ZOLOFT) 50 MG tablet Take 1 tablet (50 mg total) by mouth at bedtime. 10/14/20   Vanetta Mulders, NP  traZODone (DESYREL) 50 MG tablet Take 0.5 tablets (25 mg total) by mouth at bedtime as needed (may give at least 1 hour after bedtime Seroquel dose given if needed for sleep). 10/14/20   Vanetta Mulders, NP    Allergies    Patient has no known allergies.  Review of Systems   Review of Systems  Constitutional:  Negative for chills, fatigue and fever.  Eyes:   Negative for visual disturbance.  Cardiovascular:  Negative for chest pain and palpitations.  Gastrointestinal:  Negative for abdominal pain, nausea and vomiting.  Musculoskeletal:  Negative for arthralgias, myalgias, neck pain and neck stiffness.  Skin:  Negative for rash.  Neurological:  Negative for dizziness, weakness, numbness and headaches.  Hematological:  Does not bruise/bleed easily.  Psychiatric/Behavioral:  Positive for agitation and suicidal ideas. Negative for confusion and self-injury. The patient is nervous/anxious.    Physical Exam Updated Vital Signs BP (!) 149/90   Pulse 80   Temp 98.2 F (36.8 C) (Oral)   Resp 16   Ht 5\' 9"  (1.753 m)   Wt 96.2 kg   SpO2 98%   BMI 31.31 kg/m   Physical Exam Vitals and nursing note reviewed.  Constitutional:      Appearance: Normal appearance.  HENT:     Mouth/Throat:     Mouth: Mucous membranes are moist.  Eyes:     Conjunctiva/sclera: Conjunctivae normal.     Pupils: Pupils are equal, round, and reactive to light.  Cardiovascular:     Rate and Rhythm: Normal rate and regular rhythm.     Pulses: Normal pulses.  Pulmonary:     Effort: Pulmonary effort is normal.     Breath sounds: Normal breath sounds.  Musculoskeletal:        General: No tenderness.     Right lower leg: No edema.     Left lower leg: No edema.  Skin:    General: Skin is warm.     Capillary Refill: Capillary refill takes less than 2 seconds.     Findings: No rash.  Neurological:     General: No focal deficit present.     Mental Status: He is alert.     Sensory: No sensory deficit.     Motor: No weakness.  Psychiatric:        Attention and Perception: He is attentive. He does not perceive visual hallucinations.        Mood and Affect: Mood is anxious.        Speech: Speech normal.        Behavior: Behavior is cooperative.        Thought Content: Thought content is not paranoid. Thought content includes suicidal ideation.     Comments: Patient is  cooperative, appears very anxious.  Does not appear to be interacting with internal stimuli.    ED Results / Procedures / Treatments   Labs (all labs ordered are listed, but only abnormal results are displayed) Labs Reviewed  COMPREHENSIVE METABOLIC PANEL - Abnormal; Notable for the following components:      Result Value  Calcium 8.7 (*)    All other components within normal limits  CBC WITH DIFFERENTIAL/PLATELET - Abnormal; Notable for the following components:   Hemoglobin 12.7 (*)    HCT 38.0 (*)    All other components within normal limits  ACETAMINOPHEN LEVEL - Abnormal; Notable for the following components:   Acetaminophen (Tylenol), Serum <10 (*)    All other components within normal limits  SALICYLATE LEVEL - Abnormal; Notable for the following components:   Salicylate Lvl <7.0 (*)    All other components within normal limits  ETHANOL  URINALYSIS, ROUTINE W REFLEX MICROSCOPIC  RAPID URINE DRUG SCREEN, HOSP PERFORMED    EKG None  Radiology No results found.  Procedures Procedures   Medications Ordered in ED Medications  albuterol (VENTOLIN HFA) 108 (90 Base) MCG/ACT inhaler 2 puff (2 puffs Inhalation Given 11/30/20 1637)    ED Course  I have reviewed the triage vital signs and the nursing notes.  Pertinent labs & imaging results that were available during my care of the patient were reviewed by me and considered in my medical decision making (see chart for details).    MDM Rules/Calculators/A&P                          Patient here with suicidal ideation.  States that he was walking out into the road earlier today.  He has been seen here multiple times for same.  Denies any recent substance abuse.  Currently homeless.  Cooperative.  Patient is here voluntarily.  He is medically clear at this time.  Awaiting disposition from TTS.  TTS consult completed.  Brunetta Jeans NP has recommended patient for overnight observation.  Nighttime medication has been  ordered.  Patient continues to be cooperative.  If becomes agitated.  He would need IVC.  Final Clinical Impression(s) / ED Diagnoses Final diagnoses:  Suicidal ideation    Rx / DC Orders ED Discharge Orders     None        Rosey Bath 11/30/20 2004    Zammit, Joseph, MD 12/01/20 [phone]

## 2020-12-01 DIAGNOSIS — F332 Major depressive disorder, recurrent severe without psychotic features: Secondary | ICD-10-CM

## 2020-12-01 MED ORDER — ALBUTEROL SULFATE HFA 108 (90 BASE) MCG/ACT IN AERS
2.0000 | INHALATION_SPRAY | Freq: Once | RESPIRATORY_TRACT | Status: AC
  Administered 2020-12-01: 2 via RESPIRATORY_TRACT

## 2020-12-01 NOTE — ED Provider Notes (Signed)
Emergency Medicine Observation Re-evaluation Note  Nathan Koch is a 30 y.o. male, seen on rounds today.  Pt initially presented to the ED for complaints of V70.1 Currently, the patient is calm, not suicidal.  Physical Exam  BP (!) 101/56 (BP Location: Left Arm)   Pulse (!) 50   Temp 97.7 F (36.5 C) (Oral)   Resp 16   Ht 1.753 m (5\' 9" )   Wt 96.2 kg   SpO2 100%   BMI 31.31 kg/m  Physical Exam General: Well-appearing Cardiac: No tachycardia Lungs: Clear Psych: No agitation, no severe depression  ED Course / MDM  EKG:   I have reviewed the labs performed to date as well as medications administered while in observation.  Recent changes in the last 24 hours include none, improved.  Plan  Current plan is for discharge according to psychiatry.  is not under involuntary commitment.     [name], MD 12/01/20 [id number]

## 2020-12-01 NOTE — Discharge Instructions (Addendum)
Please Follow-up with Outpatient Provider:   [facility] - [facility]  Address: [address] [place], [place], [place] [address] Phone: [phone] Substance Abuse Outpatient Treatment Mental Health Outpatient Treatment Psychiatric Services/ Medical Services Advanced Access/Walk-In Clinic Mobile Crisis Management Services ACTT (Assertive Community Treatment Team) Dialectic Behavioral Therapy Critical Time Intervention Psychosocial Rehabilitation [facility]) Medication Assisted Treatment -24 Hour Crisis Hotline: [phone]  Substance Abuse Resources   [facility] - Admissions are currently completed Monday through Friday at 8am; both appointments and walk-ins are accepted.  Any individual that is a [facility] resident may present for a substance abuse screening and assessment for admission.  A person may be referred by numerous sources or self-refer.   Potential clients will be screened for medical necessity and appropriateness for the program.  Clients must meet criteria for high-intensity residential treatment services.  If clinically appropriate, a client will continue with the comprehensive clinical assessment and intake process, as well as enrollment in the [facility].  Address: [address] [place], [place] [address] Admin Hours: Mon-Fri 8AM to [facility] Center Hours: 24/7 Phone: [phone] Fax: [phone]  [facility] (Detox) Facility Based Crisis:  These are 3 locations for services: Please call before arrival:    [facility] Recovery Facility Based Crisis [facility])  Address: [address] [place], [place] [address] Phone: [phone]  [facility] Recovery Facility Based Crisis [facility]) Address: [address] [place] [address] [place], [place] [address] Phone#: [phone]  [facility] Recovery Facility Based Crisis [facility]) Address: [address] [place], [place], [place] [address] Phone#: [phone]   -The [employer] provides food, shelter and other programs and services to the homeless men of -[place] through our [employer].  By offering safe shelter, three meals a day, clean clothing, Biblical counseling, financial planning, vocational training, GED/education and employment assistance, we've helped mend the shattered lives of many homeless men since opening in 1974.  We have approximately 267 beds available, with a max of 312 beds including mats for emergency situations and currently house an average of 270 men a night.  Prospective Client Check-In Information Photo ID Required (State/ Out of State/ [facility]) - if photo ID is not available, clients are required to have a printout of a police/sheriff's criminal history report. Help out with chores around the [place]. No sex offender of any type (pending, charged, registered and/or any other sex related offenses) will be permitted to check in. Must be willing to abide by all rules, regulations, and policies established by the [employer]. The following will be provided - shelter, food, clothing, and biblical counseling. If you or someone you know is in need of assistance at our [facility] shelter in [place], [place], please call [phone] ext. [phone].  [facility] [employer] hours are Monday-Friday only.   [facility]:   Phone: [phone]  The [facility] Intensive Outpatient Program [facility]) means structured individual and group addiction activities and services that are provided at an outpatient program designed to assist adult and adolescent consumers to begin recovery and learn skills for recovery maintenance. The [employer] SAIOP program is offered at least 3 hours a day, 3 days a week. SAIOP services shall include a structured program consisting of, but not limited to, the following services: Individual counseling and support; Group counseling and  support; Family counseling, training or support; Biochemical assays to identify recent drug use (e.g., urine drug screens); Strategies  for relapse prevention to include community and social support systems in treatment; Life skills; Crisis contingency planning; Disease Management; and Treatment support activities that have been adapted or specifically designed for persons with physical disabilities, or persons with co-occurring disorders of mental illness and substance abuse/dependence or mental retardation/developmental disability and substance abuse/dependence.  Phone: [phone]   [facility])  Address: [address] [place], [place], [place] [address] Phone: [phone]   [facility] Address: [address] [place], [place] [address] Phone: [phone]  - a combination of group and individual sessions to meet the participants needs. This allows participants to engage in treatment and remain involved in their home and work life. - Transitional housing places program participants in a supportive living environment while they complete a treatment program and work to secure independent housing. - The Substance Abuse Intensive Outpatient Treatment Program at [employer] consists of structured group sessions and individual sessions that are designed to teach participants early recovery and relapse prevention skills. -[facility] works with the [employer] to provide a housing and treatment program for homeless veterans.     The [facility] [place] 24-Hour Call Center: [phone] Behavioral Health Crisis Line: [phone]

## 2020-12-01 NOTE — Consult Note (Addendum)
Telepsych Consultation   Reason for Consult:  Psychiatric Reassessment Referring Physician:  Bethena Roys Location of Patient:   [place] [facility] Location of Provider: Other: Virtual home office  Patient Identification: OCIE TINO MRN:  [id number] Principal Diagnosis: MDD (major depressive disorder), recurrent severe, without psychosis ([place]) Diagnosis:  Principal Problem:   MDD (major depressive disorder), recurrent severe, without psychosis ([place]) Active Problems:   Methamphetamine use disorder, moderate ([place])   Suicidal ideation   Total Time spent with patient: 30 minutes  Subjective:   ALBARAA SWINGLE is a 30 y.o. male patient seen by psychiatry for suicidal ideations, recommended for overnight observations and re-evaluation today.  Patient states, "my drug addiction and homelessness makes it difficult for me."  Patient seen via telepsych by this provider; chart reviewed and consulted with Dr. Darleene Cleaver on 12/01/20.  On evaluation AZEKIEL CREMER reports  most of what was previously captured in TTS and ED admission assessments.  Endorses suicidal ideations, he's pretty vague regarding a plan or intent but states, "it's probably not a good idea to let me go."  Patient has been seen by this facility 6x within the past 2 months with similar presentation.  Has a history for methamphetamine use, and MDD but communicated a plan to TTS worker to OD on heroine at the time of admission.  States last met-amphetamine use was approximately 24 days ago; current UDS is negative-collaborates this. He is connected with [facility] for outpatient treatment but has not been seen there for several months.  This is incongruent with information he previously shared during admission assessment.  Per records patient previously stated he wa enrollment with [facility], goes 4x a week, last visit was earlier this week.    He is prescribed zoloft 71m po daily and quetiapine 2064mpo qhs (given 10050mo qhs in ED)  for depression.  Does not think sertraline works for him, but states he does not consistently take antidepressant or psychotropic meds. Initially told this wriProbation officerat homelessness was a trigger for him, now states he can return home to stay with his wife and cites his drug use as primary trigger for depressive symptoms.  Historically, no methamphetamine use, and he has no desire to use today He has a history for misdemeanor charges but currently denies pending court dates. Patient's presentation is very suspicious for secondary gain.  He is guarded and it is difficult to parse this information out.     HPI:  Per EDP Admission Assessment 11/30/2020 Chief Complaint  Patient presents with   V70.1      MicMARKEEM NOREEN a 30 95o. male.   HPI    MicDALLON DACOSTA a [age]o. male with past medical history of anxiety, depression, bipolar disorder, methamphetamine abuse and PTSD.  He presents to the Emergency Department complaining of suicidal ideation.  He states that he has been 23 days clean from meth.  He is currently homeless.  He states that he is not getting along currently with his wife and that he is here because his wife was concerned that he may harm himself.  He admits to walking out into the street today.  He has been here multiple times recently for suicidal ideation.  He states he feels like he is not getting the care that he needs.  He denies any recent drug use, alcohol use, hallucinations or homicidal ideation. Past Psychiatric History: as outlined below  Risk to Self:  no Risk to Others:   no  Prior Inpatient Therapy:   yes Prior Outpatient Therapy:   yes, currently enrolled at [facility] 4x weekly  Past Medical History:  Past Medical History:  Diagnosis Date   Anxiety    Anxiety disorder    Asthma    Bipolar 1 disorder ([place])    Depression    Methamphetamine abuse ([place])    PTSD (post-traumatic stress disorder)     Past Surgical History:  Procedure Laterality Date   CLOSED  REDUCTION MANDIBLE N/A 07/02/2018   Procedure: CLOSED REDUCTION MANDIBULAR;  Surgeon: Margaretha Sheffield, MD;  Location: [facility] ORS;  Service: ENT;  Laterality: N/A;   Family History:  Family History  Problem Relation Age of Onset   Asthma Mother    Cancer Mother        breast cancer   Ulcers Mother    Cancer Father        esophogeal cancer   Hypertension Father    Asthma Brother    Family Psychiatric  History: unknwon Social History:  Social History   Substance and Sexual Activity  Alcohol Use Not Currently   Alcohol/week: 13.0 standard drinks   Types: 6 Cans of beer, 7 Standard drinks or equivalent per week   Comment: previously heavy drinker 2016. most recent 4 beer/ day drinker and none since 01-11-2020     Social History   Substance and Sexual Activity  Drug Use Not Currently   Types: Methamphetamines, Marijuana   Comment: last used 23 days ago per pt    Social History   Socioeconomic History   Marital status: Single    Spouse name: Not on file   Number of children: 1   Years of education: 10   Highest education level: 10th grade  Occupational History   Not on file  Tobacco Use   Smoking status: Every Day    Packs/day: 0.50    Years: 8.00    Pack years: 4.00    Types: Cigarettes   Smokeless tobacco: Never  Vaping Use   Vaping Use: Every day   Substances: Nicotine, Flavoring  Substance and Sexual Activity   Alcohol use: Not Currently    Alcohol/week: 13.0 standard drinks    Types: 6 Cans of beer, 7 Standard drinks or equivalent per week    Comment: previously heavy drinker 2016. most recent 4 beer/ day drinker and none since 01-11-2020   Drug use: Not Currently    Types: Methamphetamines, Marijuana    Comment: last used 23 days ago per pt   Sexual activity: Not on file  Other Topics Concern   Not on file  Social History Narrative   Not on file   Social Determinants of Health   Financial Resource Strain: Not on file  Food Insecurity: Not on file   Transportation Needs: Not on file  Physical Activity: Not on file  Stress: Not on file  Social Connections: Not on file   Additional Social History:    Allergies:  No Known Allergies  Labs:  Results for orders placed or performed during the hospital encounter of 11/30/20 (from the past 48 hour(s))  Comprehensive metabolic panel     Status: Abnormal   Collection Time: 11/30/20  2:59 PM  Result Value Ref Range   Sodium 138 135 - 145 mmol/L   Potassium 3.8 3.5 - 5.1 mmol/L   Chloride 106 98 - 111 mmol/L   CO2 26 22 - 32 mmol/L   Glucose, Bld 99 70 - 99 mg/dL    Comment: Glucose  reference range applies only to samples taken after fasting for at least 8 hours.   BUN 8 6 - 20 mg/dL   Creatinine, Ser 0.78 0.61 - 1.24 mg/dL   Calcium 8.7 (L) 8.9 - 10.3 mg/dL   Total Protein 7.1 6.5 - 8.1 g/dL   Albumin 4.0 3.5 - 5.0 g/dL   AST 15 15 - 41 U/L   ALT 18 0 - 44 U/L   Alkaline Phosphatase 69 38 - 126 U/L   Total Bilirubin 0.9 0.3 - 1.2 mg/dL   GFR, Estimated >60 >60 mL/min    Comment: (NOTE) Calculated using the CKD-EPI Creatinine Equation (2021)    Anion gap 6 5 - 15    Comment: Performed at [facility], [address], [place], [place] [address]  Ethanol     Status: None   Collection Time: 11/30/20  2:59 PM  Result Value Ref Range   Alcohol, Ethyl (B) <10 <10 mg/dL    Comment: (NOTE) Lowest detectable limit for serum alcohol is 10 mg/dL.  For medical purposes only. Performed at [facility], [address], [place], [place] [address]   CBC with Differential     Status: Abnormal   Collection Time: 11/30/20  2:59 PM  Result Value Ref Range   WBC 8.8 4.0 - 10.5 K/uL   RBC 4.40 4.22 - 5.81 MIL/uL   Hemoglobin 12.7 (L) 13.0 - 17.0 g/dL   HCT 38.0 (L) 39.0 - 52.0 %   MCV 86.4 80.0 - 100.0 fL   MCH 28.9 26.0 - 34.0 pg   MCHC 33.4 30.0 - 36.0 g/dL   RDW 14.2 11.5 - 15.5 %   Platelets 276 150 - 400 K/uL   nRBC 0.0 0.0 - 0.2 %   Neutrophils Relative % 66 %   Neutro Abs 5.8 1.7  - 7.7 K/uL   Lymphocytes Relative 25 %   Lymphs Abs 2.2 0.7 - 4.0 K/uL   Monocytes Relative 7 %   Monocytes Absolute 0.6 0.1 - 1.0 K/uL   Eosinophils Relative 2 %   Eosinophils Absolute 0.2 0.0 - 0.5 K/uL   Basophils Relative 0 %   Basophils Absolute 0.0 0.0 - 0.1 K/uL   Immature Granulocytes 0 %   Abs Immature Granulocytes 0.02 0.00 - 0.07 K/uL    Comment: Performed at [facility], [address], [place], [place] [address]  Acetaminophen level     Status: Abnormal   Collection Time: 11/30/20  2:59 PM  Result Value Ref Range   Acetaminophen (Tylenol), Serum <10 (L) 10 - 30 ug/mL    Comment: (NOTE) Therapeutic concentrations vary significantly. A range of 10-30 ug/mL  may be an effective concentration for many patients. However, some  are best treated at concentrations outside of this range. Acetaminophen concentrations >150 ug/mL at 4 hours after ingestion  and >50 ug/mL at 12 hours after ingestion are often associated with  toxic reactions.  Performed at [facility], [address], [place], [place] [address]   Salicylate level     Status: Abnormal   Collection Time: 11/30/20  2:59 PM  Result Value Ref Range   Salicylate Lvl <2.6 (L) 7.0 - 30.0 mg/dL    Comment: Performed at [facility], [address], [place], [place] [address]  Urinalysis, Routine w reflex microscopic     Status: None   Collection Time: 11/30/20  5:47 PM  Result Value Ref Range   Color, Urine YELLOW YELLOW   APPearance CLEAR CLEAR   Specific Gravity,  Urine 1.016 1.005 - 1.030   pH 7.0 5.0 - 8.0   Glucose, UA NEGATIVE NEGATIVE mg/dL   Hgb urine dipstick NEGATIVE NEGATIVE   Bilirubin Urine NEGATIVE NEGATIVE   Ketones, ur NEGATIVE NEGATIVE mg/dL   Protein, ur NEGATIVE NEGATIVE mg/dL   Nitrite NEGATIVE NEGATIVE   Leukocytes,Ua NEGATIVE NEGATIVE    Comment: Performed at [facility], [address] [place], [place] [address]  Urine rapid drug screen (hosp performed)     Status: None   Collection Time:  11/30/20  5:47 PM  Result Value Ref Range   Opiates NONE DETECTED NONE DETECTED   Cocaine NONE DETECTED NONE DETECTED   Benzodiazepines NONE DETECTED NONE DETECTED   Amphetamines NONE DETECTED NONE DETECTED   Tetrahydrocannabinol NONE DETECTED NONE DETECTED   Barbiturates NONE DETECTED NONE DETECTED    Comment: (NOTE) DRUG SCREEN FOR MEDICAL PURPOSES ONLY.  IF CONFIRMATION IS NEEDED FOR ANY PURPOSE, NOTIFY LAB WITHIN 5 DAYS.  LOWEST DETECTABLE LIMITS FOR URINE DRUG SCREEN Drug Class                     Cutoff (ng/mL) Amphetamine and metabolites    1000 Barbiturate and metabolites    200 Benzodiazepine                 979 Tricyclics and metabolites     300 Opiates and metabolites        300 Cocaine and metabolites        300 THC                            50 Performed at [facility], [address] [place], [place] [address]     Medications:  Current Facility-Administered Medications  Medication Dose Route Frequency Provider Last Rate Last Admin   QUEtiapine (SEROQUEL) tablet 100 mg  100 mg Oral QHS Triplett, Tammy, PA-C   100 mg at 11/30/20 2121   sertraline (ZOLOFT) tablet 50 mg  50 mg Oral QHS Triplett, Tammy, PA-C   50 mg at 11/30/20 2120   traZODone (DESYREL) tablet 25 mg  25 mg Oral QHS PRN Triplett, Tammy, PA-C   25 mg at 11/30/20 2120   Current Outpatient Medications  Medication Sig Dispense Refill   albuterol (VENTOLIN HFA) 108 (90 Base) MCG/ACT inhaler Inhale 2 puffs into the lungs every 4 (four) hours as needed for wheezing or shortness of breath.     LORazepam (ATIVAN) 2 MG tablet Take 2 mg by mouth daily as needed for anxiety.     QUEtiapine (SEROQUEL) 100 MG tablet Take 1 tablet (100 mg total) by mouth at bedtime. (Patient taking differently: Take 200 mg by mouth at bedtime.) 30 tablet 0   hydrOXYzine (ATARAX/VISTARIL) 25 MG tablet Take 1 tablet (25 mg total) by mouth 3 (three) times daily as needed for anxiety. (Patient not taking: No sig reported) 30 tablet 0    sertraline (ZOLOFT) 50 MG tablet Take 1 tablet (50 mg total) by mouth at bedtime. (Patient not taking: No sig reported) 30 tablet 0   traZODone (DESYREL) 50 MG tablet Take 0.5 tablets (25 mg total) by mouth at bedtime as needed (may give at least 1 hour after bedtime Seroquel dose given if needed for sleep). (Patient not taking: No sig reported) 30 tablet 0    Musculoskeletal: Strength & Muscle Tone: within normal limits Gait & Station:  patient sitting on bed during assessment; independently able to adjust  Patient leans: N/A   Psychiatric Specialty Exam:  Presentation  General Appearance: Neat; Casual  Eye Contact:Good  Speech:Clear and Coherent; Normal Rate  Speech Volume:Normal  Handedness:Right   Mood and Affect  Mood:Depressed  Affect:Congruent   Thought Process  Thought Processes:Coherent; Goal Directed  Descriptions of Associations:Intact  Orientation:Full (Time, Place and Person)  Thought Content:Logical  History of Schizophrenia/Schizoaffective disorder:No  Duration of Psychotic Symptoms:N/A  Hallucinations:Hallucinations: None  Ideas of Reference:None  Suicidal Thoughts:Suicidal Thoughts: Yes, Passive SI Active Intent and/or Plan: Without Plan; Without Intent SI Passive Intent and/or Plan: Without Intent; Without Plan  Homicidal Thoughts:Homicidal Thoughts: No   Sensorium  Memory:Immediate Good; Recent Good; Remote Good  Judgment:Fair (appears baseline in the setting of chronic meth-amphetamine abuse)  Insight:Fair   Executive Functions  Concentration:Good  Attention Span:Good  [place]  Language:Good   Psychomotor Activity  Psychomotor Activity:Psychomotor Activity: Normal   Assets  Assets:Communication Skills; Financial Resources/Insurance   Sleep  Sleep:Sleep: Good Number of Hours of Sleep: 6    Physical Exam: Physical Exam Constitutional:      Appearance: Normal appearance.  HENT:      Head: Normocephalic.  Cardiovascular:     Rate and Rhythm: Normal rate.     Pulses: Normal pulses.  Pulmonary:     Effort: Pulmonary effort is normal.  Musculoskeletal:        General: Normal range of motion.     Cervical back: Normal range of motion.  Neurological:     General: No focal deficit present.     Mental Status: He is alert and oriented to person, place, and time.  Psychiatric:        Attention and Perception: Attention normal.        Mood and Affect: Mood normal.        Speech: Speech normal.        Behavior: Behavior normal.        Thought Content: Thought content is not paranoid or delusional. Thought content includes suicidal (no plan or intent communicated today) ideation. Thought content does not include homicidal ideation. Thought content does not include homicidal or suicidal plan.        Cognition and Memory: Cognition normal.        Judgment: Judgment is impulsive (with drug use; at baseline appears fair).   Review of Systems  Constitutional: Negative.   HENT: Negative.    Eyes: Negative.   Respiratory: Negative.    Cardiovascular: Negative.   Gastrointestinal: Negative.   Genitourinary: Negative.   Musculoskeletal: Negative.   Skin: Negative.   Neurological: Negative.   Endo/Heme/Allergies: Negative.   Psychiatric/Behavioral:  Positive for suicidal ideas (chronic, no longer endorses a plan today). Negative for hallucinations and memory loss. The patient is not nervous/anxious and does not have insomnia.   Blood pressure (!) 101/56, pulse (!) 50, temperature 97.7 F (36.5 C), temperature source Oral, resp. rate 16, height 5' 9"  (1.753 m), weight 96.2 kg, SpO2 100 %. Body mass index is 31.31 kg/m.  Treatment Plan Summary: Plan- As per above assessment, there are no current grounds for involuntary commitment. Patient with chronic suicidal ideations, without plan or intent today.  He is already enrolled at [facility] IOP 4  days per week.  Recommend he  return to [facility] tomorrow and continue to work with [facility] for medication adjustments.    We have reviewed importance of substance abuse abstinence, potential negative impact substance abuse can have on his relationships and level of functioning, and  importance of medication compliance.  Above discussed with Glennie Isle, LCSW who ensure patient has [facility] contact information added to his AVS disposition.   Disposition: Patient is psych cleared; Refer to IOP.   This service was provided via telemedicine using a 2-way, interactive audio and video technology.  Names of all persons participating in this telemedicine service and their role in this encounter. Name: Laural Golden Role: Patient  Name: Merlyn Lot Role: PMHNP    Mallie Darting, NP 12/01/2020 10:08 AM

## 2020-12-01 NOTE — Progress Notes (Signed)
CSW provided the following resources via AVS for the patient to utilize upon discharge listed below:   [facility] - [facility]  Address: [address], [place], [place] [address] Phone: [phone] Substance Abuse Outpatient Treatment Mental Health Outpatient Treatment Psychiatric Services/ Medical Services Advanced Access/Walk-In Clinic Mobile Crisis Management Services ACTT (Assertive Community Treatment Team) Dialectic Behavioral Therapy Critical Time Intervention Psychosocial Rehabilitation [facility]) Medication Assisted Treatment -24 Hour Crisis Hotline: [phone]  Substance Abuse Resources   [facility] - Admissions are currently completed Monday through Friday at 8am; both appointments and walk-ins are accepted.  Any individual that is a [facility] resident may present for a substance abuse screening and assessment for admission.  A person may be referred by numerous sources or self-refer.   Potential clients will be screened for medical necessity and appropriateness for the program.  Clients must meet criteria for high-intensity residential treatment services.  If clinically appropriate, a client will continue with the comprehensive clinical assessment and intake process, as well as enrollment in the [facility].  Address: [address] [place], [place] [address] Admin Hours: Mon-Fri 8AM to [facility] Center Hours: 24/7 Phone: [phone] Fax: [phone]  [facility] (Detox) Facility Based Crisis:  These are 3 locations for services: Please call before arrival:    [facility] Recovery Facility Based Crisis [facility])  Address: [address] [place], [place] [address] Phone: [phone]  [facility] Recovery Facility Based Crisis [facility]) Address: [address] [place], [place] [address] Phone#: [phone]  [facility] Recovery Facility Based Crisis [facility]) Address: [address] [place],  [place], [place] [address] Phone#: [phone]   -The [employer] provides food, shelter and other programs and services to the homeless men of [place] through our [employer].  By offering safe shelter, three meals a day, clean clothing, Biblical counseling, financial planning, vocational training, GED/education and employment assistance, we've helped mend the shattered lives of many homeless men since opening in 1974.  We have approximately 267 beds available, with a max of 312 beds including mats for emergency situations and currently house an average of 270 men a night.  Prospective Client Check-In Information Photo ID Required (State/ Out of State/ [facility]) - if photo ID is not available, clients are required to have a printout of a police/sheriff's criminal history report. Help out with chores around the [facility]. No sex offender of any type (pending, charged, registered and/or any other sex related offenses) will be permitted to check in. Must be willing to abide by all rules, regulations, and policies established by the [employer]. The following will be provided - shelter, food, clothing, and biblical counseling. If you or someone you know is in need of assistance at our [facility] shelter in [place], [place], please call [phone] ext. [phone].  [facility] for [employer] hours are Monday-Friday only.   [facility]:   Phone: [phone]  The [facility] Intensive Outpatient Program [facility]) means structured individual and group addiction activities and services that are provided at an outpatient program designed to assist adult and adolescent consumers to begin recovery and learn skills for recovery maintenance. The [employer] SAIOP program is offered at least 3 hours a day, 3 days a week. SAIOP services shall include a structured program consisting of, but not limited to, the following  services: Individual counseling and support; Group counseling and support; Family counseling, training or support; Biochemical  assays to identify recent drug use (e.g., urine drug screens); Strategies for relapse prevention to include community and social support systems in treatment; Life skills; Crisis contingency planning; Disease Management; and Treatment support activities that have been adapted or specifically designed for persons with physical disabilities, or persons with co-occurring disorders of mental illness and substance abuse/dependence or mental retardation/developmental disability and substance abuse/dependence.  Phone: [phone]   [facility])  Address: [address] [place], [place], [place] [address] Phone: [phone]   [facility] Address: [address], [place], [place] [address] Phone: [phone]  - a combination of group and individual sessions to meet the participants needs. This allows participants to engage in treatment and remain involved in their home and work life. - Transitional housing places program participants in a supportive living environment while they complete a treatment program and work to secure independent housing. - The Substance Abuse Intensive Outpatient Treatment Program at [employer] consists of structured group sessions and individual sessions that are designed to teach participants early recovery and relapse prevention skills. -[facility] works with the [employer] to provide a housing and treatment program for homeless veterans.     [facility] 24-Hour Call Center: [phone] Behavioral Health Crisis Line: [phone]  Crissie Reese, MSW, LCSW-A, [place] Phone: [phone] Disposition/TOC

## 2020-12-01 NOTE — ED Notes (Signed)
Pt's belongings in locker returned to pt.

## 2020-12-03 ENCOUNTER — Other Ambulatory Visit: Payer: Self-pay

## 2020-12-03 ENCOUNTER — Emergency Department (HOSPITAL_COMMUNITY)
Admission: EM | Admit: 2020-12-03 | Discharge: 2020-12-03 | Disposition: A | Discharge status: Home / Self Care | Payer: Self-pay | Attending: Emergency Medicine | Admitting: Emergency Medicine

## 2020-12-03 ENCOUNTER — Emergency Department (HOSPITAL_COMMUNITY): Payer: Self-pay

## 2020-12-03 DIAGNOSIS — F332 Major depressive disorder, recurrent severe without psychotic features: Secondary | ICD-10-CM | POA: Insufficient documentation

## 2020-12-03 DIAGNOSIS — J4521 Mild intermittent asthma with (acute) exacerbation: Secondary | ICD-10-CM | POA: Insufficient documentation

## 2020-12-03 DIAGNOSIS — F1910 Other psychoactive substance abuse, uncomplicated: Secondary | ICD-10-CM | POA: Insufficient documentation

## 2020-12-03 DIAGNOSIS — F17210 Nicotine dependence, cigarettes, uncomplicated: Secondary | ICD-10-CM | POA: Insufficient documentation

## 2020-12-03 DIAGNOSIS — F419 Anxiety disorder, unspecified: Secondary | ICD-10-CM | POA: Insufficient documentation

## 2020-12-03 DIAGNOSIS — R45851 Suicidal ideations: Secondary | ICD-10-CM | POA: Insufficient documentation

## 2020-12-03 LAB — CBC
HCT: 36.8 % — ABNORMAL LOW (ref 39.0–52.0)
Hemoglobin: 12.3 g/dL — ABNORMAL LOW (ref 13.0–17.0)
MCH: 29.4 pg (ref 26.0–34.0)
MCHC: 33.4 g/dL (ref 30.0–36.0)
MCV: 87.8 fL (ref 80.0–100.0)
Platelets: 253 10*3/uL (ref 150–400)
RBC: 4.19 MIL/uL — ABNORMAL LOW (ref 4.22–5.81)
RDW: 14.6 % (ref 11.5–15.5)
WBC: 8 10*3/uL (ref 4.0–10.5)
nRBC: 0 % (ref 0.0–0.2)

## 2020-12-03 LAB — COMPREHENSIVE METABOLIC PANEL
ALT: 14 U/L (ref 0–44)
AST: 13 U/L — ABNORMAL LOW (ref 15–41)
Albumin: 4 g/dL (ref 3.5–5.0)
Alkaline Phosphatase: 61 U/L (ref 38–126)
Anion gap: 6 (ref 5–15)
BUN: 16 mg/dL (ref 6–20)
CO2: 24 mmol/L (ref 22–32)
Calcium: 8.7 mg/dL — ABNORMAL LOW (ref 8.9–10.3)
Chloride: 107 mmol/L (ref 98–111)
Creatinine, Ser: 0.91 mg/dL (ref 0.61–1.24)
GFR, Estimated: >60 mL/min (ref >60)
Glucose, Bld: 96 mg/dL (ref 70–99)
Potassium: 4 mmol/L (ref 3.5–5.1)
Sodium: 137 mmol/L (ref 135–145)
Total Bilirubin: 0.8 mg/dL (ref 0.3–1.2)
Total Protein: 6.9 g/dL (ref 6.5–8.1)

## 2020-12-03 LAB — RAPID URINE DRUG SCREEN, HOSP PERFORMED
Amphetamines: NOT DETECTED
Barbiturates: NOT DETECTED
Benzodiazepines: NOT DETECTED
Cocaine: NOT DETECTED
Opiates: NOT DETECTED
Tetrahydrocannabinol: NOT DETECTED

## 2020-12-03 LAB — TROPONIN I (HIGH SENSITIVITY)
Troponin I (High Sensitivity): <2 ng/L (ref <18)
Troponin I (High Sensitivity): 2 ng/L (ref <18)

## 2020-12-03 LAB — ETHANOL: Alcohol, Ethyl (B): <10 mg/dL (ref <10)

## 2020-12-03 MED ORDER — ALBUTEROL SULFATE HFA 108 (90 BASE) MCG/ACT IN AERS: 2.0000 | INHALATION_SPRAY | RESPIRATORY_TRACT | Status: DC | PRN

## 2020-12-03 MED ORDER — PREDNISONE 20 MG PO TABS
40.0000 mg | ORAL_TABLET | Freq: Every day | ORAL | Status: DC
Start: 2020-12-03 — End: 2020-12-03
  Administered 2020-12-03: 40 mg via ORAL
  Filled 2020-12-03: qty 2

## 2020-12-03 MED ORDER — SERTRALINE HCL 50 MG PO TABS
50.0000 mg | ORAL_TABLET | Freq: Every day | ORAL | Status: DC
Start: 2020-12-03 — End: 2020-12-03

## 2020-12-03 MED ORDER — LORAZEPAM 1 MG PO TABS: 2.0000 mg | ORAL_TABLET | Freq: Every day | ORAL | Status: DC | PRN

## 2020-12-03 MED ORDER — QUETIAPINE FUMARATE 100 MG PO TABS: 100.0000 mg | ORAL_TABLET | Freq: Every day | ORAL | Status: DC

## 2020-12-03 MED ORDER — TRAZODONE HCL 50 MG PO TABS: 25.0000 mg | ORAL_TABLET | Freq: Every evening | ORAL | Status: DC | PRN

## 2020-12-03 NOTE — BH Assessment (Signed)
Called the Emergency Dept and spoke to nursing secretary Aggie Cosier), requested machine to be set up for patient to complete his initial assessment.

## 2020-12-03 NOTE — ED Notes (Signed)
Patient educated on plan of care at this time. Currently awaiting telepsych.

## 2020-12-03 NOTE — Discharge Instructions (Addendum)
Follow-up as instructed by behavioral health 

## 2020-12-03 NOTE — ED Notes (Signed)
Patient transported to X-ray 

## 2020-12-03 NOTE — ED Notes (Signed)
Patient's personal belongings given back to patient at this time to change into street clothes at this time.

## 2020-12-03 NOTE — ED Provider Notes (Addendum)
Emergency Medicine Observation Re-evaluation Note  Nathan Koch is a 30 y.o. male, seen on rounds today.  Pt initially presented to the ED for complaints of Suicidal Currently, the patient is sleeping.  Physical Exam  BP 123/77 (BP Location: Right Arm)   Pulse 82   Temp 98 F (36.7 C) (Oral)   Resp 20   Ht 5\' 9"  (1.753 m)   Wt 97 kg   SpO2 100%   BMI 31.58 kg/m  Physical Exam General: no distress Lungs: Resp even and unlabored Psych: Sleeping soundly  ED Course / MDM  EKG:EKG Interpretation  Date/Time:  Tuesday December 03 2020 03:27:46 EDT Ventricular Rate:  66 PR Interval:  166 QRS Duration: 88 QT Interval:  390 QTC Calculation: 408 R Axis:   87 Text Interpretation: Normal sinus rhythm with sinus arrhythmia Normal ECG Confirmed by 1971 [phone]) on 12/03/2020 3:29:23 AM  I have reviewed the labs performed to date as well as medications administered while in observation.  Recent changes in the last 24 hours include none.  Plan  Current plan is for TTS evaluation.  02/02/2021 is not under involuntary commitment.  9:34 AM Patient cleared for discharge.     [name], MD 12/03/20 [phone]

## 2020-12-03 NOTE — BH Assessment (Addendum)
Requested Asher Muir, RN and Merry Proud, RN, to place the TTS machine in patient's room for for his initial assessment, via secure chat.

## 2020-12-03 NOTE — BH Assessment (Signed)
Disposition:   TTS completed. Clinician discussed clinicals with [facility] provider, Caryn Bee, DNP. Per [facility] provider, patient is psych cleared and may discharge. Patient recommended to follow up with current mental health provider, [facility] in [place]. AVS updated with referral information.Marland Kitchen  He may also benefit from homeless shelter referrals for [facility] from Social Work.    Patient's nurse Asher Muir, RN, provided disposition updates.

## 2020-12-03 NOTE — ED Notes (Signed)
Pt and belongings wanded. Belongings secured in locker.

## 2020-12-03 NOTE — ED Provider Notes (Signed)
[facility] EMERGENCY DEPARTMENT Provider Note   CSN: [id number] Arrival date & time: 12/03/20  0300     History Chief Complaint  Patient presents with   Suicidal    Nathan Koch is a 30 y.o. male.  Patient presents to the emergency department for evaluation of suicidal ideation.  Patient also reports that he has had to use his inhaler more frequently than usual for his asthma.      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   Methamphetamine use disorder, moderate ([name]) 09/14/2019   MDD (major depressive disorder), recurrent, severe, with psychosis ([name]) 09/14/2019   MDD (major depressive disorder), single episode, severe with psychosis ([name]) 09/14/2019   Asthma 06/26/2014    Past Surgical History:  Procedure Laterality Date   CLOSED REDUCTION MANDIBLE N/A 07/02/2018   Procedure: CLOSED REDUCTION MANDIBULAR;  Surgeon: Vernie Murders, MD;  Location: [facility] ORS;  Service: ENT;  Laterality: N/A;       Family History  Problem Relation Age of Onset   Asthma Mother    Cancer Mother        breast cancer   Ulcers Mother    Cancer Father        esophogeal cancer   Hypertension Father    Asthma Brother     Social History   Tobacco Use   Smoking status: Every Day    Packs/day: 0.50    Years: 8.00    Pack years: 4.00    Types: Cigarettes   Smokeless tobacco: Never  Vaping Use   Vaping Use: Every day   Substances: Nicotine, Flavoring  Substance Use Topics   Alcohol use: Not Currently    Alcohol/week:  13.0 standard drinks    Types: 6 Cans of beer, 7 Standard drinks or equivalent per week    Comment: previously heavy drinker 2016. most recent 4 beer/ day drinker and none since 01-11-2020   Drug use: Not Currently    Types: Methamphetamines, Marijuana    Comment: last used 23 days ago per pt    Home Medications Prior to Admission medications   Medication Sig Start Date End Date Taking? Authorizing Provider  albuterol (VENTOLIN HFA) 108 (90 Base) MCG/ACT inhaler Inhale 2 puffs into the lungs every 4 (four) hours as needed for wheezing or shortness of breath.    [provider]  hydrOXYzine (ATARAX/VISTARIL) 25 MG tablet Take 1 tablet (25 mg total) by mouth 3 (three) times daily as needed for anxiety. Patient not taking: No sig reported 10/14/20   Vanetta Mulders, NP  LORazepam (ATIVAN) 2 MG tablet Take 2 mg by mouth daily as needed for anxiety.    [provider]  QUEtiapine (SEROQUEL) 100 MG tablet Take 1 tablet (100 mg total) by mouth at bedtime. Patient taking differently: Take 200 mg by mouth at bedtime. 10/14/20   Vanetta Mulders, NP  sertraline (ZOLOFT) 50 MG tablet Take 1 tablet (50 mg total) by mouth at bedtime. Patient not taking: No sig reported  10/14/20   Vanetta Mulders, NP  traZODone (DESYREL) 50 MG tablet Take 0.5 tablets (25 mg total) by mouth at bedtime as needed (may give at least 1 hour after bedtime Seroquel dose given if needed for sleep). Patient not taking: No sig reported 10/14/20   Vanetta Mulders, NP    Allergies    Patient has no known allergies.  Review of Systems   Review of Systems  Respiratory:  Positive for wheezing.   Psychiatric/Behavioral:  Positive for dysphoric mood and suicidal ideas.   All other systems reviewed and are negative.  Physical Exam Updated Vital Signs BP 123/77 (BP Location: Right Arm)   Pulse 82   Temp 98 F (36.7 C) (Oral)   Resp 20   SpO2 100%   Physical Exam Vitals and nursing note reviewed.   Constitutional:      General: He is not in acute distress.    Appearance: Normal appearance. He is well-developed.  HENT:     Head: Normocephalic and atraumatic.     Right Ear: Hearing normal.     Left Ear: Hearing normal.     Nose: Nose normal.  Eyes:     Conjunctiva/sclera: Conjunctivae normal.     Pupils: Pupils are equal, round, and reactive to light.  Cardiovascular:     Rate and Rhythm: Regular rhythm.     Heart sounds: S1 normal and S2 normal. No murmur heard.   No friction rub. No gallop.  Pulmonary:     Effort: Pulmonary effort is normal. No respiratory distress.     Breath sounds: Normal breath sounds.  Chest:     Chest wall: No tenderness.  Abdominal:     General: Bowel sounds are normal.     Palpations: Abdomen is soft.     Tenderness: There is no abdominal tenderness. There is no guarding or rebound. Negative signs include Murphy's sign and McBurney's sign.     Hernia: No hernia is present.  Musculoskeletal:        General: Normal range of motion.     Cervical back: Normal range of motion and neck supple.  Skin:    General: Skin is warm and dry.     Findings: No rash.  Neurological:     Mental Status: He is alert and oriented to person, place, and time.     GCS: GCS eye subscore is 4. GCS verbal subscore is 5. GCS motor subscore is 6.     Cranial Nerves: No cranial nerve deficit.     Sensory: No sensory deficit.     Coordination: Coordination normal.  Psychiatric:        Speech: Speech normal.        Behavior: Behavior normal.        Thought Content: Thought content normal.    ED Results / Procedures / Treatments   Labs (all labs ordered are listed, but only abnormal results are displayed) Labs Reviewed  CBC - Abnormal; Notable for the following components:      Result Value   RBC 4.19 (*)    Hemoglobin 12.3 (*)    HCT 36.8 (*)    All other components within normal limits  COMPREHENSIVE METABOLIC PANEL - Abnormal; Notable for the following  components:   Calcium 8.7 (*)    AST 13 (*)    All other components within normal limits  RESP PANEL BY RT-PCR (FLU A&B, COVID) ARPGX2  ETHANOL  RAPID URINE DRUG SCREEN, HOSP PERFORMED  TROPONIN I (HIGH  SENSITIVITY)  TROPONIN I (HIGH SENSITIVITY)    EKG EKG Interpretation  Date/Time:  Tuesday December 03 2020 03:27:46 EDT Ventricular Rate:  66 PR Interval:  166 QRS Duration: 88 QT Interval:  390 QTC Calculation: 408 R Axis:   87 Text Interpretation: Normal sinus rhythm with sinus arrhythmia Normal ECG Confirmed by Gilda Crease [phone]) on 12/03/2020 3:29:23 AM  Radiology DG Chest Port 1 View  Result Date: 12/03/2020 CLINICAL DATA:  Shortness of breath for 5 days, history of asthma, use of inhaler with no relief EXAM: PORTABLE CHEST 1 VIEW COMPARISON:  Radiograph 11/13/2019 FINDINGS: Some mild bronchitic changes and coarsened interstitial opacities when compared to priors. No pneumothorax or visible effusion. No consolidative process. Cardiomediastinal contours are unremarkable. No acute osseous or soft tissue abnormality. IMPRESSION: Coarsened interstitial changes and bronchitic features, compatible with an acute exacerbation of reactive airways disease. Electronically Signed   By: Kreg Shropshire M.D.   On: 12/03/2020 03:56    Procedures Procedures   Medications Ordered in ED Medications  albuterol (VENTOLIN HFA) 108 (90 Base) MCG/ACT inhaler 2 puff (has no administration in time range)  LORazepam (ATIVAN) tablet 2 mg (has no administration in time range)  QUEtiapine (SEROQUEL) tablet 100 mg (has no administration in time range)  sertraline (ZOLOFT) tablet 50 mg (has no administration in time range)  traZODone (DESYREL) tablet 25 mg (has no administration in time range)    ED Course  I have reviewed the triage vital signs and the nursing notes.  Pertinent labs & imaging results that were available during my care of the patient were reviewed by me and considered in my  medical decision making (see chart for details).    MDM Rules/Calculators/A&P                           Presents with suicidal ideation.  He also reports that he has been having increased asthma symptoms.  This includes wheezing and tightness in his chest.  EKG is unremarkable.  Chest x-ray does not show any pneumonia.  We will treat with bronchodilator therapy.  Patient medically clear for psychiatric evaluation.  Final Clinical Impression(s) / ED Diagnoses Final diagnoses:  Suicidal ideations  Mild intermittent asthma with acute exacerbation    Rx / DC Orders ED Discharge Orders     None        Jaymie Misch, Canary Brim, MD 12/03/20 [phone]

## 2020-12-03 NOTE — ED Triage Notes (Signed)
Pt BIB [facility] for complaints of SI with plan. Denies HI. No AVH reported. Plan to use "knife or you know".
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
## 2020-12-03 NOTE — ED Notes (Signed)
PT. Just spoke with TTS, pt is back in the bed at this time with water by his bedside.

## 2021-01-09 ENCOUNTER — Encounter (HOSPITAL_COMMUNITY): Payer: Self-pay | Admitting: *Deleted

## 2021-01-09 ENCOUNTER — Emergency Department (HOSPITAL_COMMUNITY)
Admission: EM | Admit: 2021-01-09 | Discharge: 2021-01-09 | Disposition: A | Discharge status: Home / Self Care | Payer: Self-pay | Attending: Emergency Medicine | Admitting: Emergency Medicine

## 2021-01-09 ENCOUNTER — Other Ambulatory Visit: Payer: Self-pay

## 2021-01-09 DIAGNOSIS — Y9301 Activity, walking, marching and hiking: Secondary | ICD-10-CM | POA: Insufficient documentation

## 2021-01-09 DIAGNOSIS — F17210 Nicotine dependence, cigarettes, uncomplicated: Secondary | ICD-10-CM | POA: Insufficient documentation

## 2021-01-09 DIAGNOSIS — S90424A Blister (nonthermal), right lesser toe(s), initial encounter: Secondary | ICD-10-CM | POA: Insufficient documentation

## 2021-01-09 DIAGNOSIS — X58XXXA Exposure to other specified factors, initial encounter: Secondary | ICD-10-CM | POA: Insufficient documentation

## 2021-01-09 DIAGNOSIS — Z79899 Other long term (current) drug therapy: Secondary | ICD-10-CM | POA: Insufficient documentation

## 2021-01-09 DIAGNOSIS — J45909 Unspecified asthma, uncomplicated: Secondary | ICD-10-CM | POA: Insufficient documentation

## 2021-01-09 DIAGNOSIS — S90412A Abrasion, left great toe, initial encounter: Secondary | ICD-10-CM | POA: Insufficient documentation

## 2021-01-09 DIAGNOSIS — S80829A Blister (nonthermal), unspecified lower leg, initial encounter: Secondary | ICD-10-CM

## 2021-01-09 MED ORDER — BACITRACIN ZINC 500 UNIT/GM EX OINT
TOPICAL_OINTMENT | Freq: Once | CUTANEOUS | Status: AC
  Filled 2021-01-09: qty 0.9

## 2021-01-09 NOTE — ED Triage Notes (Signed)
Abrasions to great toe bilaterally. Patient does a lot of walking and does not wear socks. States feet have been bothering him for over a week

## 2021-01-09 NOTE — Discharge Instructions (Addendum)
Keep your feet clean and dry, apply the antibiotic given to your wound site twice daily, keep the site covered and wear socks to avoid further friction injury to your skin.

## 2021-01-09 NOTE — ED Provider Notes (Signed)
[facility] EMERGENCY DEPARTMENT Provider Note   CSN: [id number] Arrival date & time: 01/09/21  1529     History Chief Complaint  Patient presents with   Wound Check    Nathan Koch is a 30 y.o. male presenting for evaluation of several wounds on his bilateral toes.  Patient is currently homeless and has spent a great bit of time walking muse does not currently have socks and has several blisters which have popped and are now painful.  There has been no further drainage from the site once the blisters dried out.  There is some mild erythema around the blister site at his right dorsal great toe.  He denies purulent drainage or radiation of pain.  He has had no treatment for this condition prior to arrival.  The history is provided by the patient.      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   Methamphetamine use disorder, moderate ([name]) 09/14/2019   MDD (major depressive disorder), recurrent, severe, with psychosis ([name]) 09/14/2019   MDD (major depressive disorder), single episode, severe with psychosis ([name]) 09/14/2019   Asthma 06/26/2014    Past Surgical History:  Procedure Laterality Date   CLOSED REDUCTION MANDIBLE N/A 07/02/2018   Procedure: CLOSED REDUCTION MANDIBULAR;  Surgeon: Vernie Murders, MD;  Location: [facility] ORS;  Service: ENT;  Laterality: N/A;       Family History  Problem Relation Age of Onset   Asthma Mother    Cancer Mother        breast cancer   Ulcers Mother    Cancer Father         esophogeal cancer   Hypertension Father    Asthma Brother     Social History   Tobacco Use   Smoking status: Every Day    Packs/day: 0.50    Years: 8.00    Pack years: 4.00    Types: Cigarettes   Smokeless tobacco: Never  Vaping Use   Vaping Use: Every day   Substances: Nicotine, Flavoring  Substance Use Topics   Alcohol use: Not Currently    Alcohol/week: 13.0 standard drinks    Types: 6 Cans of beer, 7 Standard drinks or equivalent per week    Comment: previously heavy drinker 2016. most recent 4 beer/ day drinker and none since 01-11-2020   Drug use: Not Currently    Types: Methamphetamines, Marijuana    Comment: last used 23 days ago per pt    Home Medications Prior to Admission medications   Medication Sig Start Date End Date Taking? Authorizing Provider  albuterol (VENTOLIN HFA) 108 (90 Base) MCG/ACT inhaler Inhale 2 puffs into the lungs every 4 (four) hours as needed for wheezing or shortness of breath.    [provider]  hydrOXYzine (ATARAX/VISTARIL) 25 MG tablet Take 1 tablet (25 mg total) by mouth 3 (three) times daily as needed for anxiety. Patient not taking: No sig reported 10/14/20   Vanetta Mulders, NP  LORazepam (ATIVAN) 2 MG tablet  Take 2 mg by mouth daily as needed for anxiety.    [provider]  QUEtiapine (SEROQUEL) 100 MG tablet Take 1 tablet (100 mg total) by mouth at bedtime. Patient taking differently: Take 200 mg by mouth at bedtime. 10/14/20   Vanetta Mulders, NP  sertraline (ZOLOFT) 50 MG tablet Take 1 tablet (50 mg total) by mouth at bedtime. Patient not taking: No sig reported 10/14/20   Vanetta Mulders, NP  traZODone (DESYREL) 50 MG tablet Take 0.5 tablets (25 mg total) by mouth at bedtime as needed (may give at least 1 hour after bedtime Seroquel dose given if needed for sleep). Patient not taking: No sig reported 10/14/20   Vanetta Mulders, NP    Allergies    Patient has no known allergies.  Review of Systems    Review of Systems  Constitutional:  Negative for chills and fever.  Respiratory:  Negative for shortness of breath and wheezing.   Skin:  Positive for color change and wound.  Neurological:  Negative for numbness.   Physical Exam Updated Vital Signs BP 128/82 (BP Location: Right Arm)   Pulse 91   Temp (!) 96.8 F (36 C) (Axillary)   Resp 18   SpO2 98%   Physical Exam Constitutional:      Appearance: He is well-developed.  HENT:     Head: Normocephalic.  Cardiovascular:     Rate and Rhythm: Normal rate.  Pulmonary:     Effort: Pulmonary effort is normal.  Musculoskeletal:        General: Tenderness present.  Skin:    Comments: 1 cm denuded blister right dorsal great toe, the base is dry, there is dried blood surrounding the edges of this wound.  There is a 1 cm halo of erythema around this blister.  There is a very small abrasion at the left dorsal great toe as well.  He has an intact fluid-filled blister also at his left lateral heel.  No red streaking.  No induration or increased warmth.  Neurological:     Mental Status: He is alert and oriented to person, place, and time.     Sensory: No sensory deficit.    ED Results / Procedures / Treatments   Labs (all labs ordered are listed, but only abnormal results are displayed) Labs Reviewed - No data to display  EKG None  Radiology No results found.  Procedures Procedures   Medications Ordered in ED Medications  bacitracin ointment (has no administration in time range)    ED Course  I have reviewed the triage vital signs and the nursing notes.  Pertinent labs & imaging results that were available during my care of the patient were reviewed by me and considered in my medical decision making (see chart for details).    MDM Rules/Calculators/A&P                           Patient with several blisters on his feet, from friction of walking without socks.  There is mild erythema surrounding the blister on his  right dorsal toe which appears more inflammatory, doubt this represents a cellulitis.  He was given a tube however of bacitracin for continued treatment of the sites, dressings were applied.  He was also given several pairs of socks to help prevent further friction rub wounds.  He was given information about the [facility] for further medical care as needed, advised to return here for  any worsening symptoms. Final Clinical Impression(s) / ED Diagnoses Final diagnoses:  Friction blister of lower extremity, unspecified laterality, initial encounter    Rx / DC Orders ED Discharge Orders     None        Victoriano Lain 01/09/21 1836    Vanetta Mulders, MD 01/17/21 [phone]

## 2021-01-14 ENCOUNTER — Other Ambulatory Visit: Payer: Self-pay

## 2021-01-14 ENCOUNTER — Emergency Department (HOSPITAL_COMMUNITY)
Admission: EM | Admit: 2021-01-14 | Discharge: 2021-01-15 | Disposition: A | Discharge status: Home / Self Care | Payer: Self-pay | Attending: Emergency Medicine | Admitting: Emergency Medicine

## 2021-01-14 ENCOUNTER — Encounter (HOSPITAL_COMMUNITY): Payer: Self-pay | Admitting: *Deleted

## 2021-01-14 DIAGNOSIS — S51812A Laceration without foreign body of left forearm, initial encounter: Secondary | ICD-10-CM | POA: Insufficient documentation

## 2021-01-14 DIAGNOSIS — J45909 Unspecified asthma, uncomplicated: Secondary | ICD-10-CM | POA: Insufficient documentation

## 2021-01-14 DIAGNOSIS — X789XXA Intentional self-harm by unspecified sharp object, initial encounter: Secondary | ICD-10-CM | POA: Insufficient documentation

## 2021-01-14 DIAGNOSIS — F17210 Nicotine dependence, cigarettes, uncomplicated: Secondary | ICD-10-CM | POA: Insufficient documentation

## 2021-01-14 DIAGNOSIS — F339 Major depressive disorder, recurrent, unspecified: Secondary | ICD-10-CM

## 2021-01-14 DIAGNOSIS — Z79899 Other long term (current) drug therapy: Secondary | ICD-10-CM | POA: Insufficient documentation

## 2021-01-14 DIAGNOSIS — R45851 Suicidal ideations: Secondary | ICD-10-CM | POA: Insufficient documentation

## 2021-01-14 DIAGNOSIS — F332 Major depressive disorder, recurrent severe without psychotic features: Secondary | ICD-10-CM | POA: Insufficient documentation

## 2021-01-14 LAB — CBC WITH DIFFERENTIAL/PLATELET
Abs Immature Granulocytes: 0.03 10*3/uL (ref 0.00–0.07)
Basophils Absolute: 0.1 10*3/uL (ref 0.0–0.1)
Basophils Relative: 1 %
Eosinophils Absolute: 0.5 10*3/uL (ref 0.0–0.5)
Eosinophils Relative: 5 %
HCT: 42 % (ref 39.0–52.0)
Hemoglobin: 14.1 g/dL (ref 13.0–17.0)
Immature Granulocytes: 0 %
Lymphocytes Relative: 19 %
Lymphs Abs: 2 10*3/uL (ref 0.7–4.0)
MCH: 28.9 pg (ref 26.0–34.0)
MCHC: 33.6 g/dL (ref 30.0–36.0)
MCV: 86.1 fL (ref 80.0–100.0)
Monocytes Absolute: 0.8 10*3/uL (ref 0.1–1.0)
Monocytes Relative: 8 %
Neutro Abs: 7.1 10*3/uL (ref 1.7–7.7)
Neutrophils Relative %: 67 %
Platelets: 389 10*3/uL (ref 150–400)
RBC: 4.88 MIL/uL (ref 4.22–5.81)
RDW: 13.6 % (ref 11.5–15.5)
WBC: 10.6 10*3/uL — ABNORMAL HIGH (ref 4.0–10.5)
nRBC: 0 % (ref 0.0–0.2)

## 2021-01-14 LAB — COMPREHENSIVE METABOLIC PANEL
ALT: 23 U/L (ref 0–44)
AST: 30 U/L (ref 15–41)
Albumin: 4.2 g/dL (ref 3.5–5.0)
Alkaline Phosphatase: 65 U/L (ref 38–126)
Anion gap: 8 (ref 5–15)
BUN: 13 mg/dL (ref 6–20)
CO2: 29 mmol/L (ref 22–32)
Calcium: 9 mg/dL (ref 8.9–10.3)
Chloride: 97 mmol/L — ABNORMAL LOW (ref 98–111)
Creatinine, Ser: 0.93 mg/dL (ref 0.61–1.24)
GFR, Estimated: >60 mL/min (ref >60)
Glucose, Bld: 86 mg/dL (ref 70–99)
Potassium: 4 mmol/L (ref 3.5–5.1)
Sodium: 134 mmol/L — ABNORMAL LOW (ref 135–145)
Total Bilirubin: 0.9 mg/dL (ref 0.3–1.2)
Total Protein: 7.9 g/dL (ref 6.5–8.1)

## 2021-01-14 LAB — ETHANOL: Alcohol, Ethyl (B): <10 mg/dL (ref <10)

## 2021-01-14 NOTE — ED Notes (Addendum)
Pt. Has a 1 in laceration to left forearm. This nurse cleaned and re-dressed wound. Pt. States this is the first time they have ever cut themselves. Pt. Has three other scratches done by the pt. With a razor blade. The three others are not bleeding at this time. Pt. Also has a quarter size wound to their right big toe.
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
## 2021-01-14 NOTE — ED Provider Notes (Addendum)
[facility] EMERGENCY DEPARTMENT Provider Note   CSN: [id number] Arrival date & time: 01/14/21  1426     History Chief Complaint  Patient presents with   V70.1    Nathan Koch is a 30 y.o. male.  Pt reports he decided he does not want to be here anymore.  Pt states he is depressed due to relationship with his wife and the street where he lives.  Pt reports he cut himself because he wanted to kill himself.  Pt states he just wants someone to kill him.  Pt reports he has a bad relationship with his family.   The history is provided by the patient. No language interpreter was used.  Mental Health Problem Presenting symptoms: aggressive behavior, depression, self-mutilation, suicidal thoughts and suicide attempt   Degree of incapacity (severity):  Moderate Treatment compliance:  Untreated Relieved by:  Nothing Associated symptoms: poor judgment   Risk factors: family hx of mental illness and hx of suicide attempts       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   Methamphetamine use disorder, moderate ([name]) 09/14/2019   MDD (major depressive disorder), recurrent, severe, with psychosis ([name]) 09/14/2019   MDD (major depressive disorder), single episode, severe with psychosis ([name]) 09/14/2019   Asthma 06/26/2014    Past Surgical History:  Procedure Laterality Date   CLOSED REDUCTION MANDIBLE N/A 07/02/2018   Procedure: CLOSED REDUCTION MANDIBULAR;  Surgeon: Vernie Murders, MD;  Location: [facility] ORS;  Service: ENT;   Laterality: N/A;       Family History  Problem Relation Age of Onset   Asthma Mother    Cancer Mother        breast cancer   Ulcers Mother    Cancer Father        esophogeal cancer   Hypertension Father    Asthma Brother     Social History   Tobacco Use   Smoking status: Every Day    Packs/day: 0.50    Years: 8.00    Pack years: 4.00    Types: Cigarettes   Smokeless tobacco: Never  Vaping Use   Vaping Use: Every day   Substances: Nicotine, Flavoring  Substance Use Topics   Alcohol use: Not Currently    Alcohol/week: 13.0 standard drinks    Types: 6 Cans of beer, 7 Standard drinks or equivalent per week    Comment: previously heavy drinker 2016. most recent 4 beer/ day drinker and none since 01-11-2020   Drug use: Not Currently    Types: Methamphetamines, Marijuana    Comment: last used 23 days ago per pt    Home Medications Prior to Admission medications   Medication Sig Start Date End Date Taking? Authorizing Provider  albuterol (VENTOLIN HFA) 108 (90 Base) MCG/ACT inhaler Inhale 2 puffs into the lungs every 4 (four) hours as needed for wheezing or shortness of breath.    [provider]  hydrOXYzine (ATARAX/VISTARIL) 25 MG tablet Take 1 tablet (25 mg total) by mouth 3 (  three) times daily as needed for anxiety. Patient not taking: No sig reported 10/14/20   Vanetta Mulders, NP  LORazepam (ATIVAN) 2 MG tablet Take 2 mg by mouth daily as needed for anxiety.    [provider]  QUEtiapine (SEROQUEL) 100 MG tablet Take 1 tablet (100 mg total) by mouth at bedtime. Patient taking differently: Take 200 mg by mouth at bedtime. 10/14/20   Vanetta Mulders, NP  sertraline (ZOLOFT) 50 MG tablet Take 1 tablet (50 mg total) by mouth at bedtime. Patient not taking: No sig reported 10/14/20   Vanetta Mulders, NP  traZODone (DESYREL) 50 MG tablet Take 0.5 tablets (25 mg total) by mouth at bedtime as needed (may give at least 1 hour after bedtime Seroquel dose  given if needed for sleep). Patient not taking: No sig reported 10/14/20   Vanetta Mulders, NP    Allergies    Patient has no known allergies.  Review of Systems   Review of Systems  Psychiatric/Behavioral:  Positive for self-injury and suicidal ideas.   All other systems reviewed and are negative.  Physical Exam Updated Vital Signs BP 127/64   Pulse 87   Temp 98.2 F (36.8 C)   Resp 20   SpO2 100%   Physical Exam Vitals and nursing note reviewed.  Constitutional:      Appearance: He is well-developed.  HENT:     Head: Normocephalic and atraumatic.     Comments: Pt sobbing,  tearful   Eyes:     Conjunctiva/sclera: Conjunctivae normal.  Cardiovascular:     Rate and Rhythm: Normal rate and regular rhythm.     Heart sounds: No murmur heard. Pulmonary:     Effort: Pulmonary effort is normal. No respiratory distress.     Breath sounds: Normal breath sounds.  Abdominal:     Palpations: Abdomen is soft.     Tenderness: There is no abdominal tenderness.  Musculoskeletal:     Cervical back: Neck supple.     Comments: 56mm laceration left upper arm    Skin:    General: Skin is warm and dry.  Neurological:     Mental Status: He is alert.    ED Results / Procedures / Treatments   Labs (all labs ordered are listed, but only abnormal results are displayed) Labs Reviewed  CBC WITH DIFFERENTIAL/PLATELET  COMPREHENSIVE METABOLIC PANEL  RAPID URINE DRUG SCREEN, HOSP PERFORMED  ETHANOL    EKG None  Radiology No results found.  Procedures Procedures   Medications Ordered in ED Medications - No data to display  ED Course  I have reviewed the triage vital signs and the nursing notes.  Pertinent labs & imaging results that were available during my care of the patient were reviewed by me and considered in my medical decision making (see chart for details).    MDM Rules/Calculators/A&P                           MDM:  Wound bandaged.  Pt given food and fluids.   Labs ordered  TTS consult when labs are obtained  Final Clinical Impression(s) / ED Diagnoses Final diagnoses:  Laceration of left forearm, initial encounter  Episode of recurrent major depressive disorder, unspecified depression episode severity [facility])    Rx / DC Orders ED Discharge Orders     None        Osie Cheeks 01/14/21 1716    Cheron Schaumann  K, PA-C 01/14/21 1844    Benjiman Core, MD 01/14/21 2358

## 2021-01-15 DIAGNOSIS — F319 Bipolar disorder, unspecified: Secondary | ICD-10-CM | POA: Insufficient documentation

## 2021-01-15 LAB — RAPID URINE DRUG SCREEN, HOSP PERFORMED
Amphetamines: POSITIVE — AB
Barbiturates: NOT DETECTED
Benzodiazepines: NOT DETECTED
Cocaine: NOT DETECTED
Opiates: NOT DETECTED
Tetrahydrocannabinol: NOT DETECTED

## 2021-01-15 MED ORDER — POVIDONE-IODINE 10 % EX SOLN
CUTANEOUS | Status: AC
  Filled 2021-01-15: qty 15

## 2021-01-15 NOTE — ED Notes (Signed)
Pt bathroom is at bedside

## 2021-01-15 NOTE — ED Notes (Signed)
Lunch tray given. 

## 2021-01-15 NOTE — ED Notes (Signed)
Pt allowed phone call   
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
## 2021-01-15 NOTE — ED Notes (Signed)
Pt states that they are not mentally stable enough to go home

## 2021-01-15 NOTE — ED Notes (Signed)
Pt with TTS call 

## 2021-01-15 NOTE — Discharge Instructions (Signed)
The wound of your left arm should heal in a few days.  The glue will fall off on its own.  Use the information we gave you to help you find a therapist to see for further discussion and treatment of your thoughts and concerns.  Return here, if needed.
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
## 2021-01-15 NOTE — ED Notes (Signed)
Pt ambulated to the bathroom unassisted.  

## 2021-01-15 NOTE — ED Notes (Signed)
Breakfast tray given. °

## 2021-01-15 NOTE — ED Notes (Signed)
Lunch is at bedside. 

## 2021-01-16 ENCOUNTER — Emergency Department
Admission: EM | Admit: 2021-01-16 | Discharge: 2021-01-16 | Disposition: A | Discharge status: Home / Self Care | Payer: No Payment, Other | Attending: Student in an Organized Health Care Education/Training Program | Admitting: Student in an Organized Health Care Education/Training Program

## 2021-01-16 ENCOUNTER — Inpatient Hospital Stay
Admission: RE | Admit: 2021-01-16 | Discharge: 2021-01-24 | DRG: 885 | Disposition: A | Discharge status: Home / Self Care | Payer: 59 | Source: Intra-hospital | Attending: Behavioral Health | Admitting: Behavioral Health

## 2021-01-16 ENCOUNTER — Encounter: Payer: Self-pay | Admitting: Psychiatry

## 2021-01-16 ENCOUNTER — Other Ambulatory Visit: Payer: Self-pay

## 2021-01-16 DIAGNOSIS — R45851 Suicidal ideations: Secondary | ICD-10-CM

## 2021-01-16 DIAGNOSIS — Z23 Encounter for immunization: Secondary | ICD-10-CM

## 2021-01-16 DIAGNOSIS — F319 Bipolar disorder, unspecified: Secondary | ICD-10-CM | POA: Diagnosis not present

## 2021-01-16 DIAGNOSIS — J45909 Unspecified asthma, uncomplicated: Secondary | ICD-10-CM | POA: Diagnosis present

## 2021-01-16 DIAGNOSIS — F329 Major depressive disorder, single episode, unspecified: Secondary | ICD-10-CM | POA: Diagnosis not present

## 2021-01-16 DIAGNOSIS — Z79899 Other long term (current) drug therapy: Secondary | ICD-10-CM | POA: Diagnosis not present

## 2021-01-16 DIAGNOSIS — Z20822 Contact with and (suspected) exposure to covid-19: Secondary | ICD-10-CM | POA: Insufficient documentation

## 2021-01-16 DIAGNOSIS — F1520 Other stimulant dependence, uncomplicated: Secondary | ICD-10-CM | POA: Diagnosis present

## 2021-01-16 DIAGNOSIS — S61512A Laceration without foreign body of left wrist, initial encounter: Secondary | ICD-10-CM | POA: Diagnosis present

## 2021-01-16 DIAGNOSIS — F17210 Nicotine dependence, cigarettes, uncomplicated: Secondary | ICD-10-CM | POA: Diagnosis present

## 2021-01-16 DIAGNOSIS — Y900 Blood alcohol level of less than 20 mg/100 ml: Secondary | ICD-10-CM | POA: Insufficient documentation

## 2021-01-16 DIAGNOSIS — F3131 Bipolar disorder, current episode depressed, mild: Secondary | ICD-10-CM | POA: Insufficient documentation

## 2021-01-16 DIAGNOSIS — F4310 Post-traumatic stress disorder, unspecified: Secondary | ICD-10-CM | POA: Diagnosis present

## 2021-01-16 DIAGNOSIS — T1490XA Injury, unspecified, initial encounter: Secondary | ICD-10-CM

## 2021-01-16 DIAGNOSIS — R41843 Psychomotor deficit: Secondary | ICD-10-CM | POA: Diagnosis present

## 2021-01-16 DIAGNOSIS — F332 Major depressive disorder, recurrent severe without psychotic features: Principal | ICD-10-CM | POA: Diagnosis present

## 2021-01-16 DIAGNOSIS — Z9151 Personal history of suicidal behavior: Secondary | ICD-10-CM

## 2021-01-16 DIAGNOSIS — X789XXA Intentional self-harm by unspecified sharp object, initial encounter: Secondary | ICD-10-CM | POA: Diagnosis present

## 2021-01-16 LAB — COMPREHENSIVE METABOLIC PANEL
ALT: 18 U/L (ref 0–44)
AST: 22 U/L (ref 15–41)
Albumin: 4 g/dL (ref 3.5–5.0)
Alkaline Phosphatase: 58 U/L (ref 38–126)
Anion gap: 11 (ref 5–15)
BUN: 8 mg/dL (ref 6–20)
CO2: 27 mmol/L (ref 22–32)
Calcium: 9.3 mg/dL (ref 8.9–10.3)
Chloride: 96 mmol/L — ABNORMAL LOW (ref 98–111)
Creatinine, Ser: 0.83 mg/dL (ref 0.61–1.24)
GFR, Estimated: >60 mL/min (ref >60)
Glucose, Bld: 79 mg/dL (ref 70–99)
Potassium: 3.8 mmol/L (ref 3.5–5.1)
Sodium: 134 mmol/L — ABNORMAL LOW (ref 135–145)
Total Bilirubin: 0.8 mg/dL (ref 0.3–1.2)
Total Protein: 7.6 g/dL (ref 6.5–8.1)

## 2021-01-16 LAB — URINE DRUG SCREEN, QUALITATIVE (ARMC ONLY)
Amphetamines, Ur Screen: POSITIVE — AB
Barbiturates, Ur Screen: NOT DETECTED
Benzodiazepine, Ur Scrn: NOT DETECTED
Cannabinoid 50 Ng, Ur ~~LOC~~: NOT DETECTED
Cocaine Metabolite,Ur ~~LOC~~: NOT DETECTED
MDMA (Ecstasy)Ur Screen: NOT DETECTED
Methadone Scn, Ur: NOT DETECTED
Opiate, Ur Screen: NOT DETECTED
Phencyclidine (PCP) Ur S: NOT DETECTED
Tricyclic, Ur Screen: NOT DETECTED

## 2021-01-16 LAB — CBC
HCT: 41.7 % (ref 39.0–52.0)
Hemoglobin: 14.4 g/dL (ref 13.0–17.0)
MCH: 29.4 pg (ref 26.0–34.0)
MCHC: 34.5 g/dL (ref 30.0–36.0)
MCV: 85.3 fL (ref 80.0–100.0)
Platelets: 402 10*3/uL — ABNORMAL HIGH (ref 150–400)
RBC: 4.89 MIL/uL (ref 4.22–5.81)
RDW: 13.5 % (ref 11.5–15.5)
WBC: 8.8 10*3/uL (ref 4.0–10.5)
nRBC: 0 % (ref 0.0–0.2)

## 2021-01-16 LAB — ACETAMINOPHEN LEVEL: Acetaminophen (Tylenol), Serum: <10 ug/mL — ABNORMAL LOW (ref 10–30)

## 2021-01-16 LAB — ETHANOL: Alcohol, Ethyl (B): <10 mg/dL (ref <10)

## 2021-01-16 LAB — RESP PANEL BY RT-PCR (FLU A&B, COVID) ARPGX2
Influenza A by PCR: NEGATIVE
Influenza B by PCR: NEGATIVE
SARS Coronavirus 2 by RT PCR: NEGATIVE

## 2021-01-16 LAB — SALICYLATE LEVEL: Salicylate Lvl: <7 mg/dL — ABNORMAL LOW (ref 7.0–30.0)

## 2021-01-16 MED ORDER — SERTRALINE HCL 25 MG PO TABS
50.0000 mg | ORAL_TABLET | Freq: Every day | ORAL | Status: DC
  Administered 2021-01-17 – 2021-01-23 (×7): 50 mg via ORAL
  Filled 2021-01-16 (×9): qty 2

## 2021-01-16 MED ORDER — HYDROXYZINE HCL 25 MG PO TABS
25.0000 mg | ORAL_TABLET | Freq: Three times a day (TID) | ORAL | Status: DC | PRN
  Administered 2021-01-19 – 2021-01-22 (×3): 25 mg via ORAL
  Filled 2021-01-16 (×3): qty 1

## 2021-01-16 MED ORDER — QUETIAPINE FUMARATE 100 MG PO TABS
100.0000 mg | ORAL_TABLET | Freq: Every day | ORAL | Status: DC
  Administered 2021-01-17 – 2021-01-19 (×3): 100 mg via ORAL
  Filled 2021-01-16 (×3): qty 1

## 2021-01-16 MED ORDER — NICOTINE 14 MG/24HR TD PT24
14.0000 mg | MEDICATED_PATCH | Freq: Every day | TRANSDERMAL | Status: DC
  Filled 2021-01-16 (×4): qty 1

## 2021-01-16 MED ORDER — INFLUENZA VAC SPLIT QUAD 0.5 ML IM SUSY
0.5000 mL | PREFILLED_SYRINGE | INTRAMUSCULAR | Status: AC
  Administered 2021-01-17: 0.5 mL via INTRAMUSCULAR
  Filled 2021-01-16 (×2): qty 0.5

## 2021-01-16 MED ORDER — MAGNESIUM HYDROXIDE 400 MG/5ML PO SUSP: 30.0000 mL | Freq: Every day | ORAL | Status: DC | PRN

## 2021-01-16 MED ORDER — ACETAMINOPHEN 325 MG PO TABS
650.0000 mg | ORAL_TABLET | Freq: Four times a day (QID) | ORAL | Status: DC | PRN
  Administered 2021-01-20: 650 mg via ORAL
  Filled 2021-01-16: qty 2

## 2021-01-16 MED ORDER — ALBUTEROL SULFATE HFA 108 (90 BASE) MCG/ACT IN AERS
2.0000 | INHALATION_SPRAY | RESPIRATORY_TRACT | Status: DC | PRN
  Administered 2021-01-17 – 2021-01-24 (×15): 2 via RESPIRATORY_TRACT
  Filled 2021-01-16: qty 6.7

## 2021-01-16 MED ORDER — ALUM & MAG HYDROXIDE-SIMETH 200-200-20 MG/5ML PO SUSP: 30.0000 mL | ORAL | Status: DC | PRN

## 2021-01-16 MED ORDER — TRAZODONE HCL 50 MG PO TABS
25.0000 mg | ORAL_TABLET | Freq: Every evening | ORAL | Status: DC | PRN
  Administered 2021-01-17 – 2021-01-23 (×5): 25 mg via ORAL
  Filled 2021-01-16 (×5): qty 1

## 2021-01-16 NOTE — ED Provider Notes (Addendum)
[facility] Emergency Department Provider Note    Event Date/Time   First MD Initiated Contact with Patient 01/16/21 [phone]     (approximate)  I have reviewed the triage vital signs and the nursing notes.   HISTORY  Chief Complaint Suicidal    HPI JEREMIA GROOT is a 30 y.o. male with below listed past medical history presents to the ER for evaluation of thoughts of harming himself.  Has had multiple ER visits same in the past few months.  States he was just seen at [facility] and was released which made him feel more depressed.  States he is not homeless.  Undergoing some stress with his significant other.  Denies any recent meth use.  Past Medical History:  Diagnosis Date   Anxiety    Anxiety disorder    Asthma    Bipolar 1 disorder ([name])    Depression    Methamphetamine abuse ([name])    PTSD (post-traumatic stress disorder)    Family History  Problem Relation Age of Onset   Asthma Mother    Cancer Mother        breast cancer   Ulcers Mother    Cancer Father        esophogeal cancer   Hypertension Father    Asthma Brother    Past Surgical History:  Procedure Laterality Date   CLOSED REDUCTION MANDIBLE N/A 07/02/2018   Procedure: CLOSED REDUCTION MANDIBULAR;  Surgeon: Vernie Murders, MD;  Location: [facility] ORS;  Service: ENT;  Laterality: N/A;   Patient Active Problem List   Diagnosis Date Noted   Bipolar 1 disorder, depressed ([name])    MDD (major depressive disorder), recurrent episode, severe ([name]) 09/11/2020   MDD (major depressive disorder), recurrent severe, without psychosis ([name]) 09/10/2020   Suicidal ideation    Depression, major, recurrent, 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   Methamphetamine use disorder, moderate ([name]) 09/14/2019   MDD (major depressive disorder), recurrent, severe, with psychosis ([name]) 09/14/2019   MDD (major depressive disorder), single episode, severe with psychosis ([name]) 09/14/2019   Asthma 06/26/2014       Prior to Admission medications   Medication Sig Start Date End Date Taking? Authorizing Provider  albuterol (VENTOLIN HFA) 108 (90 Base) MCG/ACT inhaler Inhale 2 puffs into the lungs every 4 (four) hours as needed for wheezing or shortness of breath.    [provider]  hydrOXYzine (ATARAX/VISTARIL) 25 MG tablet Take 1 tablet (25 mg total) by mouth 3 (three) times daily as needed for anxiety. Patient not taking: No sig reported 10/14/20   Vanetta Mulders, NP  LORazepam (ATIVAN) 2 MG tablet Take 2 mg by mouth daily as needed for anxiety.    [provider]  QUEtiapine (SEROQUEL) 100 MG tablet Take 1 tablet (100 mg total) by mouth at bedtime. Patient taking differently: Take 200 mg by mouth at bedtime. 10/14/20   Vanetta Mulders, NP  sertraline (ZOLOFT) 50 MG tablet Take 1 tablet (50 mg total) by mouth at bedtime. Patient not taking: No sig reported 10/14/20   Vanetta Mulders, NP  traZODone (DESYREL) 50 MG tablet Take 0.5 tablets (25 mg total) by mouth at bedtime as needed (may give at least 1 hour after bedtime Seroquel dose given if needed for sleep). Patient not taking: No sig reported 10/14/20   Vanetta Mulders, NP    Allergies Patient has no known allergies.    Social History Social History   Tobacco Use   Smoking  status: Every Day    Packs/day: 0.50    Years: 8.00    Pack years: 4.00    Types: Cigarettes   Smokeless tobacco: Never  Vaping Use   Vaping Use: Every day   Substances: Nicotine, Flavoring  Substance Use Topics   Alcohol use: Not Currently    Alcohol/week: 13.0 standard drinks    Types: 6 Cans of beer, 7 Standard drinks or equivalent per week    Comment: previously heavy drinker 2016. most recent 4 beer/ day drinker and none since 01-11-2020   Drug use: Not Currently    Types: Methamphetamines, Marijuana    Comment: last used 23 days ago per pt    Review of Systems Patient denies headaches, rhinorrhea, blurry vision,  numbness, shortness of breath, chest pain, edema, cough, abdominal pain, nausea, vomiting, diarrhea, dysuria, fevers, rashes or hallucinations unless otherwise stated above in HPI. ____________________________________________   PHYSICAL EXAM:  VITAL SIGNS: Vitals:   01/16/21 0842  BP: 114/64  Pulse: 81  Resp: 18  Temp: 98.6 F (37 C)  SpO2: 99%    Constitutional: Alert and oriented.  Eyes: Conjunctivae are normal.  Head: Atraumatic. Nose: No congestion/rhinnorhea. Mouth/Throat: Mucous membranes are moist.   Neck: No stridor. Painless ROM.  Cardiovascular: Normal rate, regular rhythm. Grossly normal heart sounds.  Good peripheral circulation. Respiratory: Normal respiratory effort.  No retractions. Lungs CTAB. Gastrointestinal: Soft and nontender. No distention. No abdominal bruits. No CVA tenderness. Genitourinary:  Musculoskeletal: No lower extremity tenderness nor edema.  No joint effusions. Neurologic:  Normal speech and language. No gross focal neurologic deficits are appreciated. No facial droop Skin:  Skin is warm, dry and intact. No rash noted. Psychiatric: Mood and affect are normal. Speech and behavior are normal.  ____________________________________________   LABS (all labs ordered are listed, but only abnormal results are displayed)  Results for orders placed or performed during the hospital encounter of 01/16/21 (from the past 24 hour(s))  Comprehensive metabolic panel     Status: Abnormal   Collection Time: 01/16/21  8:47 AM  Result Value Ref Range   Sodium 134 (L) 135 - 145 mmol/L   Potassium 3.8 3.5 - 5.1 mmol/L   Chloride 96 (L) 98 - 111 mmol/L   CO2 27 22 - 32 mmol/L   Glucose, Bld 79 70 - 99 mg/dL   BUN 8 6 - 20 mg/dL   Creatinine, Ser 9.62 0.61 - 1.24 mg/dL   Calcium 9.3 8.9 - 95.2 mg/dL   Total Protein 7.6 6.5 - 8.1 g/dL   Albumin 4.0 3.5 - 5.0 g/dL   AST 22 15 - 41 U/L   ALT 18 0 - 44 U/L   Alkaline Phosphatase 58 38 - 126 U/L   Total  Bilirubin 0.8 0.3 - 1.2 mg/dL   GFR, Estimated >84 >13 mL/min   Anion gap 11 5 - 15  Ethanol     Status: None   Collection Time: 01/16/21  8:47 AM  Result Value Ref Range   Alcohol, Ethyl (B) <10 <10 mg/dL  Salicylate level     Status: Abnormal   Collection Time: 01/16/21  8:47 AM  Result Value Ref Range   Salicylate Lvl <7.0 (L) 7.0 - 30.0 mg/dL  Acetaminophen level     Status: Abnormal   Collection Time: 01/16/21  8:47 AM  Result Value Ref Range   Acetaminophen (Tylenol), Serum <10 (L) 10 - 30 ug/mL  cbc     Status: Abnormal   Collection Time: 01/16/21  8:47 AM  Result Value Ref Range   WBC 8.8 4.0 - 10.5 K/uL   RBC 4.89 4.22 - 5.81 MIL/uL   Hemoglobin 14.4 13.0 - 17.0 g/dL   HCT 98.3 38.2 - 50.5 %   MCV 85.3 80.0 - 100.0 fL   MCH 29.4 26.0 - 34.0 pg   MCHC 34.5 30.0 - 36.0 g/dL   RDW 39.7 67.3 - 41.9 %   Platelets 402 (H) 150 - 400 K/uL   nRBC 0.0 0.0 - 0.2 %   ____________________________________________ ____________________________________________  RADIOLOGY  I personally reviewed all radiographic images ordered to evaluate for the above acute complaints and reviewed radiology reports and findings.  These findings were personally discussed with the patient.  Please see medical record for radiology report.  ____________________________________________   PROCEDURES  Procedure(s) performed:  Procedures    Critical Care performed: no ____________________________________________   INITIAL IMPRESSION / ASSESSMENT AND PLAN / ED COURSE  Pertinent labs & imaging results that were available during my care of the patient were reviewed by me and considered in my medical decision making (see chart for details).   DDX: Psychosis, delirium, medication effect, noncompliance, polysubstance abuse, Si, Hi, depression   Jamien L Dalzell is a 30 y.o. who presents to the ED with for evaluation of suicidal thoughts.  No specific plan at this time.  Patient has psych history of  anxiety and bipolar.  Laboratory testing was ordered to evaluation for underlying electrolyte derangement or signs of underlying organic pathology to explain today's presentation.  Based on history and physical and laboratory evaluation, it appears that the patient's presentation is 2/2 underlying psychiatric disorder and will require further evaluation and management by inpatient psychiatry. Patient is here voluntary.  Disposition pending psychiatric evaluation.    The patient has been placed in psychiatric observation due to the need to provide a safe environment for the patient while obtaining psychiatric consultation and evaluation, as well as ongoing medical and medication management to treat the patient's condition.  The patient has not been placed under full IVC at this time.   The patient was evaluated in Emergency Department today for the symptoms described in the history of present illness. He/she was evaluated in the context of the global COVID-19 pandemic, which necessitated consideration that the patient might be at risk for infection with the SARS-CoV-2 virus that causes COVID-19. Institutional protocols and algorithms that pertain to the evaluation of patients at risk for COVID-19 are in a state of rapid change based on information released by regulatory bodies including the CDC and federal and state organizations. These policies and algorithms were followed during the patient's care in the ED.  As part of my medical decision making, I reviewed the following data within the electronic MEDICAL RECORD NUMBER Nursing notes reviewed and incorporated, Labs reviewed, notes from prior ED visits and [place] Controlled Substance Database   ____________________________________________   FINAL CLINICAL IMPRESSION(S) / ED DIAGNOSES  Final diagnoses:  Suicidal thoughts      NEW MEDICATIONS STARTED DURING THIS VISIT:  New Prescriptions   No medications on file     Note:  This document was  prepared using Dragon voice recognition software and may include unintentional dictation errors.    Willy Eddy, MD 01/16/21 1004    Willy Eddy, MD 01/16/21 [phone]

## 2021-01-16 NOTE — ED Notes (Signed)
Pt discharged to BMU.  Pt is voluntary.  VS stable.

## 2021-01-16 NOTE — ED Notes (Signed)
Pt dressed out at this time with this RN and Faith EDT. Pt belongings include: Tan shorts Black t-shirt Black sneakers Green belt [employer]- pt placed phone and other personal items inside zipper

## 2021-01-16 NOTE — Consult Note (Addendum)
[facility] Face-to-Face Psychiatry Consult   Reason for Consult:  Patient presents to ED with suicidal ideation  Referring Physician:  Roxan Hockey  Patient Identification: Nathan Koch MRN:  [id number] Principal Diagnosis: Bipolar 1 disorder, depressed ([name]) Diagnosis:  Principal Problem:   Bipolar 1 disorder, depressed ([name]) Active Problems:   Suicidal ideation   Total Time spent with patient: 1 hour  Subjective:  "I'm ready to get help now. I will jump on the opportunity." Nathan Koch is a 30 y.o. male patient admitted with worsening suicidal thoughts. HPI:  Patient seen and chart reviewed.  Patient presented to the ED this morning, stating that he has been having worsening suicidal thoughts for 3 days in the context of stress and arguments with his wife.  On interview patient tells Clinical research associate, "well the poor boy is harming himself why would you send him out the door to the street?"  Patient is referring to his presentation at [facility], ED yesterday. "If I leave I will cut myself enough to bleed to death."  Patient agrees when confronted with the number of times he has presented to the ED with chronic suicidal thoughts.  He states that he is ready at this time to get the help that he needs and is asking for psychiatric admission. "I want to get back on medicine and get into rehab."   It appears as though he has limited support at this time, stating "my family will not help me.  I beg for help and no one cares."  Patient states that he is homeless, and he and his wife are separated.  Patient states that he and his wife were living with his mother in law and his mother-in-law will not allow him to come back.  Patient identifies his 21-year-old daughter as "maybe" a reason for living.  He states he took his medications, Seroquel and Zoloft, about a week ago but ran out and he cannot afford to buy them.  Patient endorses suicidal ideation.  Denies auditory or visual hallucinations or paranoia.  Patient is  pleasant, cooperative, mood congruent with stated depression.  Patient admits to amphetamine use. Denies alcohol, marijuana, or other illicit drugs. UDS positive for Amphetamines only. BAL <10.  Past Psychiatric History: Patient has presented to the emergency department several times, with thoughts of suicide.  He has had 2 admissions to [facility] in May 2022 and June 2022.Marland Kitchen  Several other hospitalizations in various hospitals prior to that.  Mostly hospitalized for chronic depression and substance use disorder.  From a hospitalization in June 2022, he was discharged on hydroxyzine 25 mg 3 times daily as needed for anxiety, quetiapine 100 mg at bedtime, sertraline 50 mg daily, trazodone 25 mg at bedtime as needed.  Patient has been referred in the past to [facility], [place], and addiction recovery care.  He states he was at [facility] for several months.   Risk to Self:   Risk to Others:   Prior Inpatient Therapy:   Prior Outpatient Therapy:    Past Medical History:  Past Medical History:  Diagnosis Date   Anxiety    Anxiety disorder    Asthma    Bipolar 1 disorder ([name])    Depression    Methamphetamine abuse ([name])    PTSD (post-traumatic stress disorder)     Past Surgical History:  Procedure Laterality Date   CLOSED REDUCTION MANDIBLE N/A 07/02/2018   Procedure: CLOSED REDUCTION MANDIBULAR;  Surgeon: Vernie Murders, MD;  Location: [facility] ORS;  Service: ENT;  Laterality: N/A;   Family History:  Family History  Problem Relation Age of Onset   Asthma Mother    Cancer Mother        breast cancer   Ulcers Mother    Cancer Father        esophogeal cancer   Hypertension Father    Asthma Brother    Family Psychiatric  History:  Social History:  Social History   Substance and Sexual Activity  Alcohol Use Not Currently   Alcohol/week: 13.0 standard drinks   Types: 6 Cans of beer, 7 Standard drinks or equivalent per week   Comment: previously heavy drinker 2016. most recent 4 beer/  day drinker and none since 01-11-2020     Social History   Substance and Sexual Activity  Drug Use Not Currently   Types: Methamphetamines, Marijuana   Comment: last used 23 days ago per pt    Social History   Socioeconomic History   Marital status: Single    Spouse name: Not on file   Number of children: 1   Years of education: 10   Highest education level: 10th grade  Occupational History   Not on file  Tobacco Use   Smoking status: Every Day    Packs/day: 0.50    Years: 8.00    Pack years: 4.00    Types: Cigarettes   Smokeless tobacco: Never  Vaping Use   Vaping Use: Every day   Substances: Nicotine, Flavoring  Substance and Sexual Activity   Alcohol use: Not Currently    Alcohol/week: 13.0 standard drinks    Types: 6 Cans of beer, 7 Standard drinks or equivalent per week    Comment: previously heavy drinker 2016. most recent 4 beer/ day drinker and none since 01-11-2020   Drug use: Not Currently    Types: Methamphetamines, Marijuana    Comment: last used 23 days ago per pt   Sexual activity: Not on file  Other Topics Concern   Not on file  Social History Narrative   Not on file   Social Determinants of Health   Financial Resource Strain: Not on file  Food Insecurity: Not on file  Transportation Needs: Not on file  Physical Activity: Not on file  Stress: Not on file  Social Connections: Not on file   Additional Social History:    Allergies:  No Known Allergies  Labs:  Results for orders placed or performed during the hospital encounter of 01/16/21 (from the past 48 hour(s))  Comprehensive metabolic panel     Status: Abnormal   Collection Time: 01/16/21  8:47 AM  Result Value Ref Range   Sodium 134 (L) 135 - 145 mmol/L   Potassium 3.8 3.5 - 5.1 mmol/L   Chloride 96 (L) 98 - 111 mmol/L   CO2 27 22 - 32 mmol/L   Glucose, Bld 79 70 - 99 mg/dL    Comment: Glucose reference range applies only to samples taken after fasting for at least 8 hours.   BUN 8  6 - 20 mg/dL   Creatinine, Ser 2.37 0.61 - 1.24 mg/dL   Calcium 9.3 8.9 - 62.8 mg/dL   Total Protein 7.6 6.5 - 8.1 g/dL   Albumin 4.0 3.5 - 5.0 g/dL   AST 22 15 - 41 U/L   ALT 18 0 - 44 U/L   Alkaline Phosphatase 58 38 - 126 U/L   Total Bilirubin 0.8 0.3 - 1.2 mg/dL   GFR, Estimated >31 >51 mL/min    Comment: (  NOTE) Calculated using the CKD-EPI Creatinine Equation (2021)    Anion gap 11 5 - 15    Comment: Performed at [facility], [address] [place], [place] [address]  Ethanol     Status: None   Collection Time: 01/16/21  8:47 AM  Result Value Ref Range   Alcohol, Ethyl (B) <10 <10 mg/dL    Comment: (NOTE) Lowest detectable limit for serum alcohol is 10 mg/dL.  For medical purposes only. Performed at [facility], [address] [place], [place] [address]   Salicylate level     Status: Abnormal   Collection Time: 01/16/21  8:47 AM  Result Value Ref Range   Salicylate Lvl <7.0 (L) 7.0 - 30.0 mg/dL    Comment: Performed at [facility], [address] [place], [place] [address]  Acetaminophen level     Status: Abnormal   Collection Time: 01/16/21  8:47 AM  Result Value Ref Range   Acetaminophen (Tylenol), Serum <10 (L) 10 - 30 ug/mL    Comment: (NOTE) Therapeutic concentrations vary significantly. A range of 10-30 ug/mL  may be an effective concentration for many patients. However, some  are best treated at concentrations outside of this range. Acetaminophen concentrations >150 ug/mL at 4 hours after ingestion  and >50 ug/mL at 12 hours after ingestion are often associated with  toxic reactions.  Performed at [facility], [address] [place], [place] [address]   cbc     Status: Abnormal   Collection Time: 01/16/21  8:47 AM  Result Value Ref Range   WBC 8.8 4.0 - 10.5 K/uL   RBC 4.89 4.22 - 5.81 MIL/uL   Hemoglobin 14.4 13.0 - 17.0 g/dL   HCT 38.1 82.9 - 93.7 %   MCV 85.3 80.0 - 100.0 fL   MCH 29.4 26.0 - 34.0 pg    MCHC 34.5 30.0 - 36.0 g/dL   RDW 16.9 67.8 - 93.8 %   Platelets 402 (H) 150 - 400 K/uL   nRBC 0.0 0.0 - 0.2 %    Comment: Performed at [facility], [address] [place], [place] [address]  Urine Drug Screen, Qualitative     Status: Abnormal   Collection Time: 01/16/21  8:47 AM  Result Value Ref Range   Tricyclic, Ur Screen NONE DETECTED NONE DETECTED   Amphetamines, Ur Screen POSITIVE (A) NONE DETECTED   MDMA (Ecstasy)Ur Screen NONE DETECTED NONE DETECTED   Cocaine Metabolite,Ur [place] NONE DETECTED NONE DETECTED   Opiate, Ur Screen NONE DETECTED NONE DETECTED   Phencyclidine (PCP) Ur S NONE DETECTED NONE DETECTED   Cannabinoid 50 Ng, Ur [place] NONE DETECTED NONE DETECTED   Barbiturates, Ur Screen NONE DETECTED NONE DETECTED   Benzodiazepine, Ur Scrn NONE DETECTED NONE DETECTED   Methadone Scn, Ur NONE DETECTED NONE DETECTED    Comment: (NOTE) Tricyclics + metabolites, urine    Cutoff 1000 ng/mL Amphetamines + metabolites, urine  Cutoff 1000 ng/mL MDMA (Ecstasy), urine              Cutoff 500 ng/mL Cocaine Metabolite, urine          Cutoff 300 ng/mL Opiate + metabolites, urine        Cutoff 300 ng/mL Phencyclidine (PCP), urine         Cutoff 25 ng/mL Cannabinoid, urine                 Cutoff 50 ng/mL Barbiturates + metabolites, urine  Cutoff 200 ng/mL Benzodiazepine,  urine              Cutoff 200 ng/mL Methadone, urine                   Cutoff 300 ng/mL  The urine drug screen provides only a preliminary, unconfirmed analytical test result and should not be used for non-medical purposes. Clinical consideration and professional judgment should be applied to any positive drug screen result due to possible interfering substances. A more specific alternate chemical method must be used in order to obtain a confirmed analytical result. Gas chromatography / mass spectrometry (GC/MS) is the preferred confirm atory method. Performed at [facility], [address] [place], [place] [address]     No current facility-administered medications for this encounter.   Current Outpatient Medications  Medication Sig Dispense Refill   albuterol (VENTOLIN HFA) 108 (90 Base) MCG/ACT inhaler Inhale 2 puffs into the lungs every 4 (four) hours as needed for wheezing or shortness of breath.     hydrOXYzine (ATARAX/VISTARIL) 25 MG tablet Take 1 tablet (25 mg total) by mouth 3 (three) times daily as needed for anxiety. (Patient not taking: No sig reported) 30 tablet 0   LORazepam (ATIVAN) 2 MG tablet Take 2 mg by mouth daily as needed for anxiety.     QUEtiapine (SEROQUEL) 100 MG tablet Take 1 tablet (100 mg total) by mouth at bedtime. (Patient taking differently: Take 200 mg by mouth at bedtime.) 30 tablet 0   sertraline (ZOLOFT) 50 MG tablet Take 1 tablet (50 mg total) by mouth at bedtime. (Patient not taking: No sig reported) 30 tablet 0   traZODone (DESYREL) 50 MG tablet Take 0.5 tablets (25 mg total) by mouth at bedtime as needed (may give at least 1 hour after bedtime Seroquel dose given if needed for sleep). (Patient not taking: No sig reported) 30 tablet 0    Musculoskeletal: Strength & Muscle Tone: within normal limits Gait & Station: normal Patient leans: N/A    Psychiatric Specialty Exam:  Presentation  General Appearance: Appropriate for Environment  Eye Contact:Fair  Speech:Clear and Coherent  Speech Volume:Normal  Handedness:Right   Mood and Affect  Mood:Depressed  Affect:Blunt   Thought Process  Thought Processes:Coherent  Descriptions of Associations:Intact  Orientation:Full (Time, Place and Person)  Thought Content:Logical  History of Schizophrenia/Schizoaffective disorder:No  Duration of Psychotic Symptoms:N/A  Hallucinations:Hallucinations: None Ideas of Reference:None  Suicidal Thoughts:Suicidal Thoughts: Yes, Active SI Active Intent and/or Plan: With Intent; With Plan; Without Means to Carry Out Homicidal  Thoughts:Homicidal Thoughts: No  Sensorium  Memory:Immediate Good  Judgment:Impaired  Insight:Poor   Executive Functions  Concentration:Fair  Attention Span:Fair  Recall:Fair  Fund of Knowledge:Fair  Language:Fair   Psychomotor Activity  Psychomotor Activity: Psychomotor Activity: Normal  Assets  Assets:Resilience; Desire for Improvement   Sleep  Sleep: Sleep: Fair  Physical Exam: Physical Exam Vitals and nursing note reviewed.  HENT:     Head: Normocephalic.     Nose: No congestion or rhinorrhea.  Eyes:     General:        Right eye: No discharge.        Left eye: No discharge.  Cardiovascular:     Rate and Rhythm: Normal rate.  Pulmonary:     Effort: Pulmonary effort is normal.  Musculoskeletal:        General: Normal range of motion.     Cervical back: Normal range of motion.  Skin:    General: Skin is dry.  Neurological:  Mental Status: He is alert and oriented to person, place, and time.   Review of Systems  Skin:        Left forearm is bandaged d/t cutting himself a few days ago.   Psychiatric/Behavioral:  Positive for depression and suicidal ideas.   All other systems reviewed and are negative. Blood pressure 114/64, pulse 81, temperature 98.6 F (37 C), temperature source Oral, resp. rate 18, height 5\' 9"  (1.753 m), SpO2 99 %. Body mass index is 31.58 kg/m.  Treatment Plan Summary: 30 year old male with chronic suicidal ideations, worsening the past 3 days to the point of self harming behavior.  Patient also has substance use disorder. Plan: Admit patient to inpatient unit for treatment and stabilization.  .  Disposition: Recommend psychiatric Inpatient admission when medically cleared.  Vanetta Mulders, NP 01/16/2021 10:56 AM

## 2021-01-16 NOTE — ED Triage Notes (Addendum)
Pt to ER via [facility] with complaints of suicidal ideation, reports feeling "done". Reports plan to cut himself "in the right place". States he has been feeling suicidal with an intent to act on his feelings for the last three days.   Large blister present to right first toe and left heel. Reports he has been walking a lot outside.   Reports previously being seen yesterday at [facility] for the same.

## 2021-01-16 NOTE — Tx Team (Signed)
Initial Treatment Plan 01/16/2021 6:35 PM Nathan Koch KGY:[id number]   PATIENT STRESSORS: Financial difficulties   Legal issue   Medication change or noncompliance   Substance abuse     PATIENT STRENGTHS: Printmaker for treatment/growth    PATIENT IDENTIFIED PROBLEMS: Homelessness  Lack of support  Substance abuse                 DISCHARGE CRITERIA:  Ability to meet basic life and health needs Improved stabilization in mood, thinking, and/or behavior Need for constant or close observation no longer present  PRELIMINARY DISCHARGE PLAN: Return to previous living arrangement  PATIENT/FAMILY INVOLVEMENT: This treatment plan has been presented to and reviewed with the patient, Nathan Koch.  The patient has been given the opportunity to ask questions and make suggestions.  Celene Kras, RN 01/16/2021, 6:35 PM

## 2021-01-16 NOTE — Progress Notes (Signed)
New admission for the psych ED. Pt ambulatory to the unit with ED staff, as a voluntary patient. Chief compliant  on admission depression with suicidal thoughts and gesture. Pt cut his lt forearm and it is wrapped with a gauze. Pt is able to use this extremity and no signs of swelling, full range of motion and cap refill less than 3 sec at finger tips. Pt states he has been living on the street and house to house, since he left his mother-in laws house. He has also been using snorting/methamphetamine 1 gram every other day. His wife is still living with her mother, but he refuses to live there, because "my mother in-law doesn't like." Pt states he has not had any alcohol for 10 months.. Pt's affect sad, hopeless and helpless. Pt reports he stop taking medication, because " I don't need it". He continued to take his Seroquel. Pt has sores on feet and ankles from walking and poor sneakers. He legal charges pending in [facility] for carrying a concealed weapon. Lungs clear and he denies any asthma symptoms. Pt  report still having suicidal thought, no plan and he was able to contract for safety. He denies HI and AV hallucinations. Pt states he has court on 03/11/21. Pt has on open sore on his rt big toe, a healing sore on rt ankle, healing sore on lt big toe, plus a healing sore on his lt ankle and sore on his lt heel. Pt reports he might have some knives in his book bag, and or belonging bag. His backpack and belongings bag still needs to be checked by security. Pt has been on the phone and has received a phone call from his wife.

## 2021-01-17 DIAGNOSIS — F332 Major depressive disorder, recurrent severe without psychotic features: Principal | ICD-10-CM

## 2021-01-17 DIAGNOSIS — X789XXA Intentional self-harm by unspecified sharp object, initial encounter: Secondary | ICD-10-CM | POA: Diagnosis present

## 2021-01-17 DIAGNOSIS — S61512A Laceration without foreign body of left wrist, initial encounter: Secondary | ICD-10-CM | POA: Diagnosis present

## 2021-01-17 LAB — HEMOGLOBIN A1C
Hgb A1c MFr Bld: 6 % — ABNORMAL HIGH (ref 4.8–5.6)
Mean Plasma Glucose: 126 mg/dL

## 2021-01-17 LAB — LIPID PANEL
Cholesterol: 94 mg/dL (ref 0–200)
HDL: 28 mg/dL — ABNORMAL LOW (ref >40)
LDL Cholesterol: 46 mg/dL (ref 0–99)
Total CHOL/HDL Ratio: 3.4 RATIO
Triglycerides: 100 mg/dL (ref <150)
VLDL: 20 mg/dL (ref 0–40)

## 2021-01-17 MED ORDER — ENSURE ENLIVE PO LIQD
237.0000 mL | Freq: Two times a day (BID) | ORAL | Status: DC
  Administered 2021-01-17 – 2021-01-24 (×11): 237 mL via ORAL

## 2021-01-17 MED ORDER — ADULT MULTIVITAMIN W/MINERALS CH
1.0000 | ORAL_TABLET | Freq: Every day | ORAL | Status: DC
  Administered 2021-01-17 – 2021-01-24 (×7): 1 via ORAL
  Filled 2021-01-17 (×8): qty 1

## 2021-01-17 NOTE — Progress Notes (Signed)
Recreation Therapy Notes  INPATIENT RECREATION TR PLAN  Patient Details Name: Nathan Koch MRN: [id number] DOB: 02/10/1991 Today's Date: 01/17/2021  Rec Therapy Plan Is patient appropriate for Therapeutic Recreation?: Yes Treatment times per week: at least 3 Estimated Length of Stay: 5-7 days TR Treatment/Interventions: Group participation (Comment)  Discharge Criteria Pt will be discharged from therapy if:: Discharged Treatment plan/goals/alternatives discussed and agreed upon by:: Patient/family  Discharge Summary     Farida Mcreynolds 01/17/2021, 3:19 PM

## 2021-01-17 NOTE — Progress Notes (Addendum)
Recreation Therapy Notes  Date: 01/17/2021  Time: 10:00 am   Location: Craft room   Behavioral response: N/A   Intervention Topic: Relaxation    Discussion/Intervention: Patient did not attend group.   Clinical Observations/Feedback:  Patient did not attend group.   Render Marley LRT/CTRS        Keante Urizar 01/17/2021 12:27 PM

## 2021-01-17 NOTE — H&P (Signed)
Psychiatric Admission Assessment Adult  Patient Identification: Nathan Koch MRN:  [id number] Date of Evaluation:  01/17/2021 Chief Complaint:  Severe recurrent major depression without psychotic features ([name]) [F33.2] Principal Diagnosis: Severe recurrent major depression without psychotic features ([name]) Diagnosis:  Principal Problem:   Severe recurrent major depression without psychotic features ([name]) Active Problems:   Methamphetamine use disorder, moderate ([name])   Suicidal ideation   Self-inflicted laceration of left wrist ([name])  History of Present Illness: Patient seen and chart reviewed.  30 year old man with a history of recurrent depression and substance abuse came to the hospital reporting suicidal ideation.  He tells me his mood has been bad for a couple of months.  He had been staying sober in a long-term program for most of a year but then dropped out.  Since then has been on the street for the most part.  Family not able to help him.  Using drugs intermittently.  Usually not taking medication.  Feeling depressed and negative most of the time.  For the last few days he has been having active suicidal thoughts.  Cut himself on the forearm and required some repair in the emergency room.  Patient denied to me that he was using any drugs but his drug screen is positive for amphetamines which is usually his drug of choice.  He tells me that he still occasionally takes Seroquel and Zoloft but it sounds like it is pretty intermittent.  He denies any auditory or visual hallucinations.  Denies homicidal ideation. Associated Signs/Symptoms: Depression Symptoms:  depressed mood, anhedonia, psychomotor retardation, fatigue, feelings of worthlessness/guilt, suicidal thoughts without plan, anxiety, Duration of Depression Symptoms: Greater than two weeks  (Hypo) Manic Symptoms:  Impulsivity, Anxiety Symptoms:  Excessive Worry, Psychotic Symptoms:   None reported PTSD  Symptoms: Negative Total Time spent with patient: 1 hour  Past Psychiatric History: Patient has had several prior hospitalizations and multiple visits to the ER.  Mood symptoms always complicated by substance abuse.  Has done reasonably well when he was in long-term recovery programs but back on the street has relapsed.  Positive for past cutting behavior quite a bit and for past suicidal behavior  Is the patient at risk to self? Yes.    Has the patient been a risk to self in the past 6 months? Yes.    Has the patient been a risk to self within the distant past? Yes.    Is the patient a risk to others? No.  Has the patient been a risk to others in the past 6 months? No.  Has the patient been a risk to others within the distant past? No.   Prior Inpatient Therapy:   Prior Outpatient Therapy:    Alcohol Screening: 1. How often do you have a drink containing alcohol?: Monthly or less 2. How many drinks containing alcohol do you have on a typical day when you are drinking?: 1 or 2 3. How often do you have six or more drinks on one occasion?: Never AUDIT-C Score: 1 4. How often during the last year have you found that you were not able to stop drinking once you had started?: Never 5. How often during the last year have you failed to do what was normally expected from you because of drinking?: Never 6. How often during the last year have you needed a first drink in the morning to get yourself going after a heavy drinking session?: Never 7. How often during the last year have you had a  feeling of guilt of remorse after drinking?: Never 8. How often during the last year have you been unable to remember what happened the night before because you had been drinking?: Never 9. Have you or someone else been injured as a result of your drinking?: No 10. Has a relative or friend or a doctor or another health worker been concerned about your drinking or suggested you cut down?: No Alcohol Use Disorder  Identification Test Final Score (AUDIT): 1 Substance Abuse History in the last 12 months:  Yes.   Consequences of Substance Abuse: Worsening depression and impulsive behavior and self-injury Previous Psychotropic Medications: Yes  Psychological Evaluations: Yes  Past Medical History:  Past Medical History:  Diagnosis Date   Anxiety    Anxiety disorder    Asthma    Bipolar 1 disorder ([name])    Depression    Methamphetamine abuse ([name])    PTSD (post-traumatic stress disorder)     Past Surgical History:  Procedure Laterality Date   CLOSED REDUCTION MANDIBLE N/A 07/02/2018   Procedure: CLOSED REDUCTION MANDIBULAR;  Surgeon: Vernie Murders, MD;  Location: [facility] ORS;  Service: ENT;  Laterality: N/A;   Family History:  Family History  Problem Relation Age of Onset   Asthma Mother    Cancer Mother        breast cancer   Ulcers Mother    Cancer Father        esophogeal cancer   Hypertension Father    Asthma Brother    Family Psychiatric  History: None reported Tobacco Screening:   Social History:  Social History   Substance and Sexual Activity  Alcohol Use Not Currently   Alcohol/week: 13.0 standard drinks   Types: 6 Cans of beer, 7 Standard drinks or equivalent per week   Comment: previously heavy drinker 2016. most recent 4 beer/ day drinker and none since 01-11-2020     Social History   Substance and Sexual Activity  Drug Use Yes   Types: Methamphetamines   Comment: states he uses 1 g every other day    Additional Social History:                           Allergies:  No Known Allergies Lab Results:  Results for orders placed or performed during the hospital encounter of 01/16/21 (from the past 48 hour(s))  Lipid panel     Status: Abnormal   Collection Time: 01/17/21  8:23 AM  Result Value Ref Range   Cholesterol 94 0 - 200 mg/dL   Triglycerides 660 <630 mg/dL   HDL 28 (L) >16 mg/dL   Total CHOL/HDL Ratio 3.4 RATIO   VLDL 20 0 - 40 mg/dL   LDL  Cholesterol 46 0 - 99 mg/dL    Comment:        Total Cholesterol/HDL:CHD Risk Coronary Heart Disease Risk Table                     Men   Women  1/2 Average Risk   3.4   3.3  Average Risk       5.0   4.4  2 X Average Risk   9.6   7.1  3 X Average Risk  23.4   11.0        Use the calculated Patient Ratio above and the CHD Risk Table to determine the patient's CHD Risk.        ATP III CLASSIFICATION (LDL):  <  100     mg/dL   Optimal  383-291  mg/dL   Near or Above                    Optimal  130-159  mg/dL   Borderline  916-606  mg/dL   High  >004     mg/dL   Very High Performed at [facility], [address] [place], [place] [address]     Blood Alcohol level:  Lab Results  Component Value Date   [facility] <10 01/16/2021   ETH <10 01/14/2021    Metabolic Disorder Labs:  Lab Results  Component Value Date   HGBA1C 6.0 (H) 10/08/2020   MPG 126 10/08/2020   MPG 131.24 09/11/2020   No results found for: PROLACTIN Lab Results  Component Value Date   CHOL 94 01/17/2021   TRIG 100 01/17/2021   HDL 28 (L) 01/17/2021   CHOLHDL 3.4 01/17/2021   VLDL 20 01/17/2021   LDLCALC 46 01/17/2021   LDLCALC 49 10/08/2020    Current Medications: Current Facility-Administered Medications  Medication Dose Route Frequency Provider Last Rate Last Admin   acetaminophen (TYLENOL) tablet 650 mg  650 mg Oral Q6H PRN Dalton Molesworth, Jackquline Denmark, MD       albuterol (VENTOLIN HFA) 108 (90 Base) MCG/ACT inhaler 2 puff  2 puff Inhalation Q4H PRN Naleyah Ohlinger, Jackquline Denmark, MD   2 puff at 01/17/21 1045   alum & mag hydroxide-simeth (MAALOX/MYLANTA) 200-200-20 MG/5ML suspension 30 mL  30 mL Oral Q4H PRN Marylu Dudenhoeffer, Jackquline Denmark, MD       feeding supplement (ENSURE ENLIVE / ENSURE PLUS) liquid 237 mL  237 mL Oral BID BM Pooja Camuso T, MD   237 mL at 01/17/21 1044   hydrOXYzine (ATARAX/VISTARIL) tablet 25 mg  25 mg Oral TID PRN Revia Nghiem, Jackquline Denmark, MD       magnesium hydroxide (MILK OF MAGNESIA) suspension 30 mL  30 mL Oral  Daily PRN Robertson Colclough, Jackquline Denmark, MD       multivitamin with minerals tablet 1 tablet  1 tablet Oral Daily Takota Cahalan, Jackquline Denmark, MD   1 tablet at 01/17/21 1044   nicotine (NICODERM CQ - dosed in mg/24 hours) patch 14 mg  14 mg Transdermal Daily Katti Pelle T, MD       QUEtiapine (SEROQUEL) tablet 100 mg  100 mg Oral QHS Montana Fassnacht T, MD       sertraline (ZOLOFT) tablet 50 mg  50 mg Oral QHS Kennetha Pearman, Jackquline Denmark, MD       traZODone (DESYREL) tablet 25 mg  25 mg Oral QHS PRN Rondal Vandevelde, Jackquline Denmark, MD       PTA Medications: Medications Prior to Admission  Medication Sig Dispense Refill Last Dose   albuterol (VENTOLIN HFA) 108 (90 Base) MCG/ACT inhaler Inhale 2 puffs into the lungs every 4 (four) hours as needed for wheezing or shortness of breath.      hydrOXYzine (ATARAX/VISTARIL) 25 MG tablet Take 1 tablet (25 mg total) by mouth 3 (three) times daily as needed for anxiety. (Patient not taking: No sig reported) 30 tablet 0    LORazepam (ATIVAN) 2 MG tablet Take 2 mg by mouth daily as needed for anxiety.      QUEtiapine (SEROQUEL) 100 MG tablet Take 1 tablet (100 mg total) by mouth at bedtime. (Patient taking differently: Take 200 mg by mouth at bedtime.) 30 tablet 0    sertraline (ZOLOFT) 50 MG tablet Take 1 tablet (50 mg total)  by mouth at bedtime. (Patient not taking: No sig reported) 30 tablet 0    traZODone (DESYREL) 50 MG tablet Take 0.5 tablets (25 mg total) by mouth at bedtime as needed (may give at least 1 hour after bedtime Seroquel dose given if needed for sleep). (Patient not taking: No sig reported) 30 tablet 0     Musculoskeletal: Strength & Muscle Tone: within normal limits Gait & Station: normal Patient leans: N/A            Psychiatric Specialty Exam:  Presentation  General Appearance: Appropriate for Environment  Eye Contact:Fair  Speech:Clear and Coherent  Speech Volume:Normal  Handedness:Right   Mood and Affect  Mood:Depressed  Affect:Blunt   Thought Process   Thought Processes:Coherent  Duration of Psychotic Symptoms: N/A  Past Diagnosis of Schizophrenia or Psychoactive disorder: No  Descriptions of Associations:Intact  Orientation:Full (Time, Place and Person)  Thought Content:Logical  Hallucinations:Hallucinations: None  Ideas of Reference:None  Suicidal Thoughts:Suicidal Thoughts: Yes, Active SI Active Intent and/or Plan: With Intent; With Plan; Without Means to Carry Out  Homicidal Thoughts:Homicidal Thoughts: No   Sensorium  Memory:Immediate Good  Judgment:Impaired  Insight:Poor   Executive Functions  Concentration:Fair  Attention Span:Fair  Recall:Fair  Fund of Knowledge:Fair  Language:Fair   Psychomotor Activity  Psychomotor Activity:Psychomotor Activity: Normal   Assets  Assets:Resilience; Desire for Improvement   Sleep  Sleep:Sleep: Fair    Physical Exam: Physical Exam Vitals and nursing note reviewed.  Constitutional:      Appearance: Normal appearance.  HENT:     Head: Normocephalic and atraumatic.     Mouth/Throat:     Pharynx: Oropharynx is clear.  Eyes:     Pupils: Pupils are equal, round, and reactive to light.  Cardiovascular:     Rate and Rhythm: Normal rate and regular rhythm.  Pulmonary:     Effort: Pulmonary effort is normal.     Breath sounds: Normal breath sounds.  Abdominal:     General: Abdomen is flat.     Palpations: Abdomen is soft.  Musculoskeletal:        General: Normal range of motion.  Skin:    General: Skin is warm and dry.  Neurological:     General: No focal deficit present.     Mental Status: He is alert. Mental status is at baseline.  Psychiatric:        Attention and Perception: He is inattentive.        Mood and Affect: Mood is depressed. Affect is blunt.        Speech: Speech is delayed.        Behavior: Behavior is slowed.        Thought Content: Thought content includes suicidal ideation.        Cognition and Memory: Memory is impaired.         Judgment: Judgment is impulsive.   Review of Systems  Constitutional:  Positive for malaise/fatigue.  HENT: Negative.    Eyes: Negative.   Respiratory: Negative.    Cardiovascular: Negative.   Gastrointestinal: Negative.   Musculoskeletal: Negative.   Skin: Negative.   Neurological: Negative.   Psychiatric/Behavioral:  Positive for depression, substance abuse and suicidal ideas. The patient is nervous/anxious and has insomnia.   Blood pressure 105/65, pulse 74, temperature 97.9 F (36.6 C), temperature source Oral, resp. rate 18, height 5\' 9"  (1.753 m), weight 94.3 kg, SpO2 100 %. Body mass index is 30.72 kg/m.  Treatment Plan Summary: Plan continue outpatient medications of modest dose  Seroquel and Zoloft.  Orders will be placed for wound management.  Engage in individual and group therapy.  Follow up with assessment by social work and discuss options for treatment plan including rehab.  Ongoing assessment of suicidal ideation  Observation Level/Precautions:  15 minute checks  Laboratory:  UDS  Psychotherapy:    Medications:    Consultations:    Discharge Concerns:    Estimated LOS:  Other:     Physician Treatment Plan for Primary Diagnosis: Severe recurrent major depression without psychotic features ([name]) Long Term Goal(s): Improvement in symptoms so as ready for discharge  Short Term Goals: Ability to disclose and discuss suicidal ideas and Ability to demonstrate self-control will improve  Physician Treatment Plan for Secondary Diagnosis: Principal Problem:   Severe recurrent major depression without psychotic features ([name]) Active Problems:   Methamphetamine use disorder, moderate ([name])   Suicidal ideation   Self-inflicted laceration of left wrist ([name])  Long Term Goal(s): Improvement in symptoms so as ready for discharge  Short Term Goals: Ability to identify and develop effective coping behaviors will improve and Ability to maintain clinical measurements within  normal limits will improve  I certify that inpatient services furnished can reasonably be expected to improve the patient's condition.    Mordecai Rasmussen, MD 9/23/20224:02 PM

## 2021-01-17 NOTE — Progress Notes (Signed)
Patient is calm and cooperative with assessment. He denies SI, HI, and AVH. Patient denies pain or other physical concerns. Patient interacts appropriately with staff and other patients on the unit. Support and encouragement given. Patient remains safe on the unit at this time.
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
## 2021-01-17 NOTE — Progress Notes (Signed)
NUTRITION ASSESSMENT  Pt identified as at risk on the Malnutrition Screen Tool  INTERVENTION: 1. Supplements: Ensure Enlive po BID, each supplement provides 350 kcal and 20 grams of protein , MVI with minerals daily  NUTRITION DIAGNOSIS: Unintentional weight loss related to sub-optimal intake as evidenced by pt report.   Goal: Pt to meet >/= 90% of their estimated nutrition needs.  Monitor:  PO intake  Assessment:  Pt admitted with suicidal ideations.   Per chart review, pt is homeless. He and his wife are separated and he was recently living with his mother in law, who does not welcome him back in to her home. He has not been taking his medications secondary to financial restraints.   Pt takes 1 gram of methamphetamine every other day per nursing notes.   Suspect diet of poor nutritional quality PTA secondary to complex social situation.   Reviewed wt hx; pt has experienced a 3% wt loss over the past 3 months, which is not significant for time frame.   30 y.o. male  Height: Ht Readings from Last 1 Encounters:  01/16/21 5\' 9"  (1.753 m)    Weight: Wt Readings from Last 1 Encounters:  01/16/21 94.3 kg    Weight Hx: Wt Readings from Last 10 Encounters:  01/16/21 94.3 kg  12/03/20 97 kg  11/30/20 96.2 kg  11/13/20 95.3 kg  11/10/20 96.6 kg  11/08/20 96.6 kg  10/23/20 97 kg  10/06/20 97.1 kg  09/18/20 100.7 kg  09/15/20 95.3 kg    BMI:  Body mass index is 30.72 kg/m. Pt meets criteria for obesity, class I based on current BMI. Obesity is a complex, chronic medical condition that is optimally managed by a multidisciplinary care team. Weight loss is not an ideal goal for an acute inpatient hospitalization. However, if further work-up for obesity is warranted, consider outpatient referral to outpatient bariatric service and/or [facility].    Estimated Nutritional Needs: Kcal: 25-30 kcal/kg Protein: > 1 gram  protein/kg Fluid: 1 ml/kcal  Diet Order:  Diet Order             Diet regular Room service appropriate? Yes; Fluid consistency: Thin  Diet effective now                  Pt is also offered choice of unit snacks mid-morning and mid-afternoon.  Pt is eating as desired.   Lab results and medications reviewed.   09/17/20, RD, LDN, CDCES Registered Dietitian II Certified Diabetes Care and Education Specialist Please refer to [facility] for RD and/or RD on-call/weekend/after hours pager

## 2021-01-17 NOTE — BH IP Treatment Plan (Signed)
Interdisciplinary Treatment and Diagnostic Plan Update  01/17/2021 Time of Session: 9:00AM Nathan Koch MRN: [id number]  Principal Diagnosis: <principal problem not specified>  Secondary Diagnoses: Active Problems:   Severe recurrent major depression without psychotic features ([name])   Current Medications:  Current Facility-Administered Medications  Medication Dose Route Frequency Provider Last Rate Last Admin   acetaminophen (TYLENOL) tablet 650 mg  650 mg Oral Q6H PRN Clapacs, [name], MD       albuterol (VENTOLIN HFA) 108 (90 Base) MCG/ACT inhaler 2 puff  2 puff Inhalation Q4H PRN Clapacs, [name], MD       alum & mag hydroxide-simeth (MAALOX/MYLANTA) 200-200-20 MG/5ML suspension 30 mL  30 mL Oral Q4H PRN Clapacs, [name], MD       feeding supplement (ENSURE ENLIVE / ENSURE PLUS) liquid 237 mL  237 mL Oral BID BM Clapacs, [name], MD       hydrOXYzine (ATARAX/VISTARIL) tablet 25 mg  25 mg Oral TID PRN Clapacs, [name], MD       influenza vac split quadrivalent PF (FLUARIX) injection 0.5 mL  0.5 mL Intramuscular Tomorrow-1000 Clapacs, [name], MD       magnesium hydroxide (MILK OF MAGNESIA) suspension 30 mL  30 mL Oral Daily PRN Clapacs, [name], MD       multivitamin with minerals tablet 1 tablet  1 tablet Oral Daily Clapacs, [name], MD       nicotine (NICODERM CQ - dosed in mg/24 hours) patch 14 mg  14 mg Transdermal Daily Clapacs, [name], MD       QUEtiapine (SEROQUEL) tablet 100 mg  100 mg Oral QHS Clapacs, [name], MD       sertraline (ZOLOFT) tablet 50 mg  50 mg Oral QHS Clapacs, [name], MD       traZODone (DESYREL) tablet 25 mg  25 mg Oral QHS PRN Clapacs, [name], MD       PTA Medications: Medications Prior to Admission  Medication Sig Dispense Refill Last Dose   albuterol (VENTOLIN HFA) 108 (90 Base) MCG/ACT inhaler Inhale 2 puffs into the lungs every 4 (four) hours as needed for wheezing or shortness of breath.      hydrOXYzine (ATARAX/VISTARIL) 25 MG tablet Take 1 tablet (25 mg  total) by mouth 3 (three) times daily as needed for anxiety. (Patient not taking: No sig reported) 30 tablet 0    LORazepam (ATIVAN) 2 MG tablet Take 2 mg by mouth daily as needed for anxiety.      QUEtiapine (SEROQUEL) 100 MG tablet Take 1 tablet (100 mg total) by mouth at bedtime. (Patient taking differently: Take 200 mg by mouth at bedtime.) 30 tablet 0    sertraline (ZOLOFT) 50 MG tablet Take 1 tablet (50 mg total) by mouth at bedtime. (Patient not taking: No sig reported) 30 tablet 0    traZODone (DESYREL) 50 MG tablet Take 0.5 tablets (25 mg total) by mouth at bedtime as needed (may give at least 1 hour after bedtime Seroquel dose given if needed for sleep). (Patient not taking: No sig reported) 30 tablet 0     Patient Stressors: Financial difficulties   Legal issue   Medication change or noncompliance   Substance abuse    Patient Strengths: Hydrographic surveyor for treatment/growth   Treatment Modalities: Medication Management, Group therapy, Case management,  1 to 1 session with clinician, Psychoeducation, Recreational therapy.   Physician Treatment Plan for Primary Diagnosis: <principal problem not specified> Long  Term Goal(s):     Short Term Goals:    Medication Management: Evaluate patient's response, side effects, and tolerance of medication regimen.  Therapeutic Interventions: 1 to 1 sessions, Unit Group sessions and Medication administration.  Evaluation of Outcomes: Not Met  Physician Treatment Plan for Secondary Diagnosis: Active Problems:   Severe recurrent major depression without psychotic features ([place])  Long Term Goal(s):     Short Term Goals:       Medication Management: Evaluate patient's response, side effects, and tolerance of medication regimen.  Therapeutic Interventions: 1 to 1 sessions, Unit Group sessions and Medication administration.  Evaluation of Outcomes: Not Met   RN Treatment Plan for Primary Diagnosis: <principal problem  not specified> Long Term Goal(s): Knowledge of disease and therapeutic regimen to maintain health will improve  Short Term Goals: Ability to remain free from injury will improve, Ability to demonstrate self-control, Ability to verbalize feelings will improve, Ability to disclose and discuss suicidal ideas, Ability to identify and develop effective coping behaviors will improve, and Compliance with prescribed medications will improve  Medication Management: RN will administer medications as ordered by provider, will assess and evaluate patient's response and provide education to patient for prescribed medication. RN will report any adverse and/or side effects to prescribing provider.  Therapeutic Interventions: 1 on 1 counseling sessions, Psychoeducation, Medication administration, Evaluate responses to treatment, Monitor vital signs and CBGs as ordered, Perform/monitor CIWA, COWS, AIMS and Fall Risk screenings as ordered, Perform wound care treatments as ordered.  Evaluation of Outcomes: Not Met   LCSW Treatment Plan for Primary Diagnosis: <principal problem not specified> Long Term Goal(s): Safe transition to appropriate next level of care at discharge, Engage patient in therapeutic group addressing interpersonal concerns.  Short Term Goals: Engage patient in aftercare planning with referrals and resources, Increase social support, Increase ability to appropriately verbalize feelings, Increase emotional regulation, Facilitate acceptance of mental health diagnosis and concerns, Facilitate patient progression through stages of change regarding substance use diagnoses and concerns, Identify triggers associated with mental health/substance abuse issues, and Increase skills for wellness and recovery  Therapeutic Interventions: Assess for all discharge needs, 1 to 1 time with Social worker, Explore available resources and support systems, Assess for adequacy in community support network, Educate family  and significant other(s) on suicide prevention, Complete Psychosocial Assessment, Interpersonal group therapy.  Evaluation of Outcomes: Not Met   Progress in Treatment: Attending groups: No. Participating in groups: No. Taking medication as prescribed: Yes. Toleration medication: Yes. Family/Significant other contact made: No, will contact:  when given permission. Patient understands diagnosis: Yes. Discussing patient identified problems/goals with staff: Yes. Medical problems stabilized or resolved: Yes. Denies suicidal/homicidal ideation: Yes. Issues/concerns per patient self-inventory: No. Other: None.  New problem(s) identified: No, Describe:  none.  New Short Term/Long Term Goal(s): detox, medication management for mood stabilization; elimination of SI thoughts; development of comprehensive mental wellness/sobriety plan.  Patient Goals: "Just to get my mind right, get myself out of negative thinking, and get back into rehab."   Discharge Plan or Barriers: CSW will assist pt with development of an appropriate aftercare/discharge plan. Pt voices interest in rehab.  Reason for Continuation of Hospitalization: Depression Medication stabilization Suicidal ideation  Estimated Length of Stay: 1-7 days   Scribe for Treatment Team: Shirl Harris, LCSW 01/17/2021 9:44 AM

## 2021-01-17 NOTE — [facility] Suicide Risk Assessment (Signed)
[facility] INPATIENT:  Family/Significant Other Suicide Prevention Education  Suicide Prevention Education:  Education Completed; Nathan Koch/girlfriend [phone]), has been identified by the patient as the family member/significant other with whom the patient will be residing, and identified as the person(s) who will aid the patient in the event of a mental health crisis (suicidal ideations/suicide attempt).  With written consent from the patient, the family member/significant other has been provided the following suicide prevention education, prior to the and/or following the discharge of the patient.  The suicide prevention education provided includes the following: Suicide risk factors Suicide prevention and interventions National Suicide Hotline telephone number [facility] assessment telephone number [facility] Emergency Assistance 911 [facility] and/or Residential Mobile Crisis Unit telephone number  Request made of family/significant other to: Remove weapons (e.g., guns, rifles, knives), all items previously/currently identified as safety concern.   Remove drugs/medications (over-the-counter, prescriptions, illicit drugs), all items previously/currently identified as a safety concern.  The family member/significant other verbalizes understanding of the suicide prevention education information provided.  The family member/significant other agrees to remove the items of safety concern listed above.  Nathan Koch stated that she does not know what brought pt into the hospital and that she just thinks that he deals with addiction. She denied feeling that pt was a danger to himself or anyone else. Nathan Koch stated that she was uncertain of whether he had access to any weapons or not. No concerns expressed. Contact ended without incident.   Nathan Koch 01/17/2021, 2:00 PM

## 2021-01-17 NOTE — Progress Notes (Signed)
Recreation Therapy Notes  INPATIENT RECREATION THERAPY ASSESSMENT  Patient Details Name: Nathan Koch MRN: [id number] DOB: 05-26-1990 Today's Date: 01/17/2021       Information Obtained From: Patient  Able to Participate in Assessment/Interview: Yes  Patient Presentation: Responsive  Reason for Admission (Per Patient): Active Symptoms, Suicidal Ideation, Suicide Attempt, Substance Abuse  Patient Stressors: Relationship, Work  Coping Skills:   Substance Abuse, Music  Leisure Interests (2+):  Sports - Conservation officer, historic buildings of Recreation/Participation: Chief Executive Officer of Community Resources:  Yes  Community Resources:  [employer]  Current Use: Yes  If no, Barriers?:    Expressed Interest in [employer] Information: Yes  County of Residence:  [place]  Patient Main Form of Transportation: Walk  Patient Strengths:  Good father  Patient Identified Areas of Improvement:  My mental health  Patient Goal for Hospitalization:  Get into rehab  Current SI (including self-harm):  Yes (No plan)  Current HI:  No  Current AVH: No  Staff Intervention Plan: Group Attendance, Collaborate with Interdisciplinary Treatment Team  Consent to Intern Participation: N/A  Cherrise Occhipinti 01/17/2021, 3:17 PM

## 2021-01-17 NOTE — Progress Notes (Signed)
Patient alert and oriented x 4 no distress noted, interacting appropriately with peers and staff, he denies SI/HI/AVH. Patient was fast asleep and didn't take nighttime medication. 15 minutes safety checks maintained will continue to monitor.

## 2021-01-17 NOTE — Progress Notes (Signed)
Patient alert and oriented x 4, affect is blunted, thoughts are organized and coherent, she denies SI/HI/AVH he is interacting appropriately with peers and staff complaint with medication regimen. 15 minutes safety checks maintained will continue to monitor.
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
## 2021-01-17 NOTE — Group Note (Signed)
[facility] LCSW Group Therapy Note   Group Date: 01/17/2021 Start Time: 1300 End Time: 1400  Type of Therapy and Topic:  Group Therapy:  Feelings around Relapse and Recovery  Participation Level:  Did Not Attend   Mood:  Description of Group:    Patients in this group will discuss emotions they experience before and after a relapse. They will process how experiencing these feelings, or avoidance of experiencing them, relates to having a relapse. Facilitator will guide patients to explore emotions they have related to recovery. Patients will be encouraged to process which emotions are more powerful. They will be guided to discuss the emotional reaction significant others in their lives may have to patients' relapse or recovery. Patients will be assisted in exploring ways to respond to the emotions of others without this contributing to a relapse.  Therapeutic Goals: Patient will identify two or more emotions that lead to relapse for them:  Patient will identify two emotions that result when they relapse:  Patient will identify two emotions related to recovery:  Patient will demonstrate ability to communicate their needs through discussion and/or role plays.   Summary of Patient Progress: Patient did not attend group despite encouraged participation.    Therapeutic Modalities:   Cognitive Behavioral Therapy Solution-Focused Therapy Assertiveness Training Relapse Prevention Therapy   Nathan Koch W Nathan Koch, LCSWA 

## 2021-01-18 DIAGNOSIS — F329 Major depressive disorder, single episode, unspecified: Secondary | ICD-10-CM | POA: Diagnosis not present

## 2021-01-18 NOTE — Group Note (Signed)
[facility] LCSW Group Therapy Note   Group Date: 01/18/2021 Start Time: 1300 End Time: 1400   Type of Therapy/Topic:  Group Therapy:  Balance in Life  Participation Level:  Did Not Attend   Description of Group:    This group will address the concept of balance and how it feels and looks when one is unbalanced. Patients will be encouraged to process areas in their lives that are out of balance, and identify reasons for remaining unbalanced. Facilitators will guide patients utilizing problem- solving interventions to address and correct the stressor making their life unbalanced. Understanding and applying boundaries will be explored and addressed for obtaining  and maintaining a balanced life. Patients will be encouraged to explore ways to assertively make their unbalanced needs known to significant others in their lives, using other group members and facilitator for support and feedback.  Therapeutic Goals: Patient will identify two or more emotions or situations they have that consume much of in their lives. Patient will identify signs/triggers that life has become out of balance:  Patient will identify two ways to set boundaries in order to achieve balance in their lives:  Patient will demonstrate ability to communicate their needs through discussion and/or role plays  Summary of Patient Progress: Patient did not attend group despite encouraged participation.       Therapeutic Modalities:   Cognitive Behavioral Therapy Solution-Focused Therapy Assertiveness Training   Norberto Sorenson, Theresia Majors 01/18/2021 3:58PM

## 2021-01-18 NOTE — Progress Notes (Signed)
Patient is calm and cooperative with assessment. He denies SI, HI, and AVH. Patient denies pain or any other physical problems. Patient states he is tired and would like to rest in his room for the morning. Patient refused his nicotine patch and says he is not having any nicotine cravings. Patient also stated that he would take his multivitamin pill later in the day. Patient remains safe on the unit at this time.

## 2021-01-18 NOTE — Progress Notes (Signed)
[facility] MD Progress Note  01/18/2021 10:10 AM Nathan Koch  MRN:  [id number] Subjective:  Patient evaluated in his room this morning; he has been asleep since breakfast. He reports feeling " pretty much the same" with depressed mood, anxiety and occasional suicidal ideations. He took his medicines last night, slept well and has improved appetite. He is reporting mild irritability and is worried about his housing post DC. He is denying any side effects from the medicines.  Nursing Report : Patient alert and oriented x 4, affect is blunted, thoughts are organized and coherent, she denies SI/HI/AVH he is interacting appropriately with peers and staff complaint with medication regimen. Principal Problem: Severe recurrent major depression without psychotic features ([name]) Diagnosis: Principal Problem:   Severe recurrent major depression without psychotic features ([name]) Active Problems:   Methamphetamine use disorder, moderate ([name])   Suicidal ideation   Self-inflicted laceration of left wrist ([name])  Total Time spent with patient: 20 minutes  Past Psychiatric History: several prior hospitalizations ; past cutting behavior quite a bit and for past suicidal behavior  Past Medical History:  Past Medical History:  Diagnosis Date   Anxiety    Anxiety disorder    Asthma    Bipolar 1 disorder ([name])    Depression    Methamphetamine abuse ([name])    PTSD (post-traumatic stress disorder)     Past Surgical History:  Procedure Laterality Date   CLOSED REDUCTION MANDIBLE N/A 07/02/2018   Procedure: CLOSED REDUCTION MANDIBULAR;  Surgeon: Vernie Murders, MD;  Location: [facility] ORS;  Service: ENT;  Laterality: N/A;   Family History:  Family History  Problem Relation Age of Onset   Asthma Mother    Cancer Mother        breast cancer   Ulcers Mother    Cancer Father        esophogeal cancer   Hypertension Father    Asthma Brother    Family Psychiatric  History: None Social History:  Social History    Substance and Sexual Activity  Alcohol Use Not Currently   Alcohol/week: 13.0 standard drinks   Types: 6 Cans of beer, 7 Standard drinks or equivalent per week   Comment: previously heavy drinker 2016. most recent 4 beer/ day drinker and none since 01-11-2020     Social History   Substance and Sexual Activity  Drug Use Yes   Types: Methamphetamines   Comment: states he uses 1 g every other day    Social History   Socioeconomic History   Marital status: Single    Spouse name: Not on file   Number of children: 1   Years of education: 10   Highest education level: 10th grade  Occupational History   Not on file  Tobacco Use   Smoking status: Every Day    Packs/day: 0.50    Years: 8.00    Pack years: 4.00    Types: Cigarettes   Smokeless tobacco: Never  Vaping Use   Vaping Use: Every day   Substances: Nicotine, Flavoring  Substance and Sexual Activity   Alcohol use: Not Currently    Alcohol/week: 13.0 standard drinks    Types: 6 Cans of beer, 7 Standard drinks or equivalent per week    Comment: previously heavy drinker 2016. most recent 4 beer/ day drinker and none since 01-11-2020   Drug use: Yes    Types: Methamphetamines    Comment: states he uses 1 g every other day   Sexual activity: Not on file  Other Topics Concern   Not on file  Social History Narrative   Not on file   Social Determinants of Health   Financial Resource Strain: Not on file  Food Insecurity: Not on file  Transportation Needs: Not on file  Physical Activity: Not on file  Stress: Not on file  Social Connections: Not on file   Additional Social History:                         Sleep: Good  Appetite:  Fair  Current Medications: Current Facility-Administered Medications  Medication Dose Route Frequency Provider Last Rate Last Admin   acetaminophen (TYLENOL) tablet 650 mg  650 mg Oral Q6H PRN Clapacs, Jackquline Denmark, MD       albuterol (VENTOLIN HFA) 108 (90 Base) MCG/ACT inhaler 2  puff  2 puff Inhalation Q4H PRN Clapacs, Jackquline Denmark, MD   2 puff at 01/17/21 2125   alum & mag hydroxide-simeth (MAALOX/MYLANTA) 200-200-20 MG/5ML suspension 30 mL  30 mL Oral Q4H PRN Clapacs, Jackquline Denmark, MD       feeding supplement (ENSURE ENLIVE / ENSURE PLUS) liquid 237 mL  237 mL Oral BID BM Clapacs, [name], MD   237 mL at 01/17/21 1044   hydrOXYzine (ATARAX/VISTARIL) tablet 25 mg  25 mg Oral TID PRN Clapacs, Jackquline Denmark, MD       magnesium hydroxide (MILK OF MAGNESIA) suspension 30 mL  30 mL Oral Daily PRN Clapacs, Jackquline Denmark, MD       multivitamin with minerals tablet 1 tablet  1 tablet Oral Daily Clapacs, Jackquline Denmark, MD   1 tablet at 01/17/21 1044   nicotine (NICODERM CQ - dosed in mg/24 hours) patch 14 mg  14 mg Transdermal Daily Clapacs, Jackquline Denmark, MD       QUEtiapine (SEROQUEL) tablet 100 mg  100 mg Oral QHS Clapacs, [name], MD   100 mg at 01/17/21 2126   sertraline (ZOLOFT) tablet 50 mg  50 mg Oral QHS Clapacs, [name], MD   50 mg at 01/17/21 2125   traZODone (DESYREL) tablet 25 mg  25 mg Oral QHS PRN Clapacs, Jackquline Denmark, MD   25 mg at 01/17/21 2125    Lab Results:  Results for orders placed or performed during the hospital encounter of 01/16/21 (from the past 48 hour(s))  Lipid panel     Status: Abnormal   Collection Time: 01/17/21  8:23 AM  Result Value Ref Range   Cholesterol 94 0 - 200 mg/dL   Triglycerides 093 <818 mg/dL   HDL 28 (L) >29 mg/dL   Total CHOL/HDL Ratio 3.4 RATIO   VLDL 20 0 - 40 mg/dL   LDL Cholesterol 46 0 - 99 mg/dL    Comment:        Total Cholesterol/HDL:CHD Risk Coronary Heart Disease Risk Table                     Men   Women  1/2 Average Risk   3.4   3.3  Average Risk       5.0   4.4  2 X Average Risk   9.6   7.1  3 X Average Risk  23.4   11.0        Use the calculated Patient Ratio above and the CHD Risk Table to determine the patient's CHD Risk.        ATP III CLASSIFICATION (LDL):  <100  mg/dL   Optimal  315-400  mg/dL   Near or Above                    Optimal   130-159  mg/dL   Borderline  867-619  mg/dL   High  >509     mg/dL   Very High Performed at [facility], [address] [place], [place] [address]   Hemoglobin A1c     Status: Abnormal   Collection Time: 01/17/21  8:23 AM  Result Value Ref Range   Hgb A1c MFr Bld 6.0 (H) 4.8 - 5.6 %    Comment: (NOTE)         Prediabetes: 5.7 - 6.4         Diabetes: >6.4         Glycemic control for adults with diabetes: <7.0    Mean Plasma Glucose 126 mg/dL    Comment: (NOTE) Performed At: [facility] [address] [place], [place] [id number] Jolene Schimke MD JA:[id number]     Blood Alcohol level:  Lab Results  Component Value Date   [facility] <10 01/16/2021   ETH <10 01/14/2021    Metabolic Disorder Labs: Lab Results  Component Value Date   HGBA1C 6.0 (H) 01/17/2021   MPG 126 01/17/2021   MPG 126 10/08/2020   No results found for: PROLACTIN Lab Results  Component Value Date   CHOL 94 01/17/2021   TRIG 100 01/17/2021   HDL 28 (L) 01/17/2021   CHOLHDL 3.4 01/17/2021   VLDL 20 01/17/2021   LDLCALC 46 01/17/2021   LDLCALC 49 10/08/2020    Physical Findings: AIMS:  , ,  ,  ,    CIWA:    COWS:     Musculoskeletal: Strength & Muscle Tone: within normal limits Gait & Station: normal Patient leans: N/A  Psychiatric Specialty Exam:  Presentation  General Appearance: Appropriate for Environment  Eye Contact:Fair  Speech:Clear and Coherent  Speech Volume:Normal  Handedness:Right   Mood and Affect  Mood:Depressed  Affect:Blunt   Thought Process  Thought Processes:Coherent  Descriptions of Associations:Intact  Orientation:Full (Time, Place and Person)  Thought Content:Logical  History of Schizophrenia/Schizoaffective disorder:No  Duration of Psychotic Symptoms:N/A  Hallucinations:No data recorded Ideas of Reference:None  Suicidal Thoughts:Passive SI.  Homicidal Thoughts:Deneis Memory:Immediate  Good  Judgment:Impaired  Insight:Poor   Executive Functions  Concentration:Fair  Attention Span:Fair  Recall:Fair  Fund of Knowledge:Fair  Language:Fair   Psychomotor Activity  Psychomotor Activity: No data recorded  Assets  Assets:Resilience; Desire for Improvement   Sleep  Sleep: No data recorded   Physical Exam: Physical Exam Vitals and nursing note reviewed.  HENT:     Head: Normocephalic and atraumatic.     Nose: Nose normal.  Eyes:     Extraocular Movements: Extraocular movements intact.     Conjunctiva/sclera: Conjunctivae normal.     Pupils: Pupils are equal, round, and reactive to light.  Cardiovascular:     Rate and Rhythm: Normal rate and regular rhythm.  Pulmonary:     Effort: Pulmonary effort is normal.     Breath sounds: Normal breath sounds.  Abdominal:     General: Abdomen is flat. Bowel sounds are normal.     Palpations: Abdomen is soft.  Musculoskeletal:        General: Normal range of motion.     Cervical back: Normal range of motion and neck supple.  Skin:    General: Skin is warm and dry.  Neurological:  General: No focal deficit present.     Mental Status: He is alert and oriented to person, place, and time.   Review of Systems  Constitutional: Negative.   HENT: Negative.    Eyes: Negative.   Respiratory: Negative.    Cardiovascular: Negative.   Gastrointestinal: Negative.   Genitourinary: Negative.   Musculoskeletal: Negative.   Skin: Negative.   Neurological: Negative.   Endo/Heme/Allergies: Negative.   Psychiatric/Behavioral:  Positive for depression, substance abuse and suicidal ideas. The patient is nervous/anxious.   Blood pressure 104/61, pulse 64, temperature 98 F (36.7 C), temperature source Oral, resp. rate 18, height 5\' 9"  (1.753 m), weight 94.3 kg, SpO2 94 %. Body mass index is 30.72 kg/m.   Treatment Plan Summary: Daily contact with patient to assess and evaluate symptoms and progress in treatment,  Medication management, and Plan : Continue Zoloft and Seroquel   Nathan Koch 01/18/2021, 10:10 AM

## 2021-01-18 NOTE — Progress Notes (Signed)
Awake, alert, oriented x 4. No acute distress noted. Pleasant affect; cooperative with care and able to verbalize needs. Denies SI/HI/AVH. Ambulates to medication room for scheduled medications and also requests trazodone for sleep. Remains safe on the unit with q15 min safety checks.

## 2021-01-19 DIAGNOSIS — F329 Major depressive disorder, single episode, unspecified: Secondary | ICD-10-CM | POA: Diagnosis not present

## 2021-01-19 NOTE — Group Note (Signed)
LCSW Group Therapy Note  Group Date: 01/19/2021 Start Time: 1300 End Time: 1400   Type of Therapy and Topic:  Group Therapy - How To Cope with Nervousness about Discharge   Participation Level:  Did Not Attend   Description of Group This process group involved identification of patients' feelings about discharge. Some of them are scheduled to be discharged soon, while others are new admissions, but each of them was asked to share thoughts and feelings surrounding discharge from the hospital. One common theme was that they are excited at the prospect of going home, while another was that many of them are apprehensive about sharing why they were hospitalized. Patients were given the opportunity to discuss these feelings with their peers in preparation for discharge.  Therapeutic Goals  Patient will identify their overall feelings about pending discharge. Patient will think about how they might proactively address issues that they believe will once again arise once they get home (i.e. with parents). Patients will participate in discussion about having hope for change.   Summary of Patient Progress: Patient did not attend group despite encouraged participation.   Therapeutic Modalities Cognitive Behavioral Therapy   Marletta Lor 01/19/2021  2:53 PM

## 2021-01-19 NOTE — Progress Notes (Signed)
Pt denies SI/HI/AVH, isolates in his room. Will continue to monitor pt per Q15 minute face checks and monitor for safety and progress.

## 2021-01-19 NOTE — Progress Notes (Signed)
Awake, alert and oriented x 4. No acute distress. Denies SI/HI/AVH. Denies pain. Client ambulates from room to med room for scheduled medications. Reports anxiety and requests medication to help with sleep (See MAR). Remains safe on the unit with q15 min safety checks.

## 2021-01-19 NOTE — Progress Notes (Addendum)
[facility] MD Progress Note  01/19/2021 9:50 AM Nathan Koch  MRN:  [id number] Subjective:  Patient evaluated in his room this morning; he has been compliant with his medicines and reports feeling " tired". His mood remains depressed and anxious with a congruent affect and psychomotor retardation. He is denying current SI, is eating better and is mostly confined to his room. I have encouraged him to attend groups and be more visible on the unit.  Nursing Note : Awake, alert, oriented x 4. No acute distress noted. Pleasant affect; cooperative with care and able to verbalize needs. Denies SI/HI/AVH. Ambulates to medication room for scheduled medications and also requests trazodone for sleep.Patient did not attend group despite encouraged participation.   Principal Problem: Severe recurrent major depression without psychotic features ([name]) Diagnosis: Principal Problem:   Severe recurrent major depression without psychotic features ([name]) Active Problems:   Methamphetamine use disorder, moderate ([name])   Suicidal ideation   Self-inflicted laceration of left wrist ([name])  Total Time spent with patient: 20 minutes  Past Psychiatric History: several prior hospitalizations ; past cutting behavior quite a bit and for past suicidal behavior  Past Medical History:  Past Medical History:  Diagnosis Date   Anxiety    Anxiety disorder    Asthma    Bipolar 1 disorder ([name])    Depression    Methamphetamine abuse ([name])    PTSD (post-traumatic stress disorder)     Past Surgical History:  Procedure Laterality Date   CLOSED REDUCTION MANDIBLE N/A 07/02/2018   Procedure: CLOSED REDUCTION MANDIBULAR;  Surgeon: Vernie Murders, MD;  Location: [facility] ORS;  Service: ENT;  Laterality: N/A;   Family History:  Family History  Problem Relation Age of Onset   Asthma Mother    Cancer Mother        breast cancer   Ulcers Mother    Cancer Father        esophogeal cancer   Hypertension Father    Asthma Brother     Family Psychiatric  History: None Social History:  Social History   Substance and Sexual Activity  Alcohol Use Not Currently   Alcohol/week: 13.0 standard drinks   Types: 6 Cans of beer, 7 Standard drinks or equivalent per week   Comment: previously heavy drinker 2016. most recent 4 beer/ day drinker and none since 01-11-2020     Social History   Substance and Sexual Activity  Drug Use Yes   Types: Methamphetamines   Comment: states he uses 1 g every other day    Social History   Socioeconomic History   Marital status: Single    Spouse name: Not on file   Number of children: 1   Years of education: 10   Highest education level: 10th grade  Occupational History   Not on file  Tobacco Use   Smoking status: Every Day    Packs/day: 0.50    Years: 8.00    Pack years: 4.00    Types: Cigarettes   Smokeless tobacco: Never  Vaping Use   Vaping Use: Every day   Substances: Nicotine, Flavoring  Substance and Sexual Activity   Alcohol use: Not Currently    Alcohol/week: 13.0 standard drinks    Types: 6 Cans of beer, 7 Standard drinks or equivalent per week    Comment: previously heavy drinker 2016. most recent 4 beer/ day drinker and none since 01-11-2020   Drug use: Yes    Types: Methamphetamines    Comment: states he uses 1  g every other day   Sexual activity: Not on file  Other Topics Concern   Not on file  Social History Narrative   Not on file   Social Determinants of Health   Financial Resource Strain: Not on file  Food Insecurity: Not on file  Transportation Needs: Not on file  Physical Activity: Not on file  Stress: Not on file  Social Connections: Not on file   Additional Social History:                         Sleep: Good  Appetite:  Fair  Current Medications: Current Facility-Administered Medications  Medication Dose Route Frequency Provider Last Rate Last Admin   acetaminophen (TYLENOL) tablet 650 mg  650 mg Oral Q6H PRN Clapacs,  [name], MD       albuterol (VENTOLIN HFA) 108 (90 Base) MCG/ACT inhaler 2 puff  2 puff Inhalation Q4H PRN Clapacs, Jackquline Denmark, MD   2 puff at 01/18/21 1807   alum & mag hydroxide-simeth (MAALOX/MYLANTA) 200-200-20 MG/5ML suspension 30 mL  30 mL Oral Q4H PRN Clapacs, Jackquline Denmark, MD       feeding supplement (ENSURE ENLIVE / ENSURE PLUS) liquid 237 mL  237 mL Oral BID BM Clapacs, [name], MD   237 mL at 01/17/21 1044   hydrOXYzine (ATARAX/VISTARIL) tablet 25 mg  25 mg Oral TID PRN Clapacs, Jackquline Denmark, MD       magnesium hydroxide (MILK OF MAGNESIA) suspension 30 mL  30 mL Oral Daily PRN Clapacs, Jackquline Denmark, MD       multivitamin with minerals tablet 1 tablet  1 tablet Oral Daily Clapacs, [name], MD   1 tablet at 01/18/21 1023   nicotine (NICODERM CQ - dosed in mg/24 hours) patch 14 mg  14 mg Transdermal Daily Clapacs, [name], MD       QUEtiapine (SEROQUEL) tablet 100 mg  100 mg Oral QHS Clapacs, [name], MD   100 mg at 01/18/21 2114   sertraline (ZOLOFT) tablet 50 mg  50 mg Oral QHS Clapacs, [name], MD   50 mg at 01/18/21 2114   traZODone (DESYREL) tablet 25 mg  25 mg Oral QHS PRN Clapacs, Jackquline Denmark, MD   25 mg at 01/18/21 2114    Lab Results: No results found for this or any previous visit (from the past 48 hour(s)).  Blood Alcohol level:  Lab Results  Component Value Date   ETH <10 01/16/2021   ETH <10 01/14/2021    Metabolic Disorder Labs: Lab Results  Component Value Date   HGBA1C 6.0 (H) 01/17/2021   MPG 126 01/17/2021   MPG 126 10/08/2020   No results found for: PROLACTIN Lab Results  Component Value Date   CHOL 94 01/17/2021   TRIG 100 01/17/2021   HDL 28 (L) 01/17/2021   CHOLHDL 3.4 01/17/2021   VLDL 20 01/17/2021   LDLCALC 46 01/17/2021   LDLCALC 49 10/08/2020    Physical Findings: AIMS:  , ,  ,  ,    CIWA:    COWS:     Musculoskeletal: Strength & Muscle Tone: within normal limits Gait & Station: normal Patient leans: N/A  Psychiatric Specialty Exam:  Presentation  General  Appearance: Appropriate for Environment  Eye Contact:Fair  Speech:Clear and Coherent  Speech Volume:Normal  Handedness:Right   Mood and Affect  Mood:Depressed  Affect: Flat Thought Process  Thought Processes:Coherent  Descriptions of Associations:Intact  Orientation:Full (Time, Place and  Person)  Thought Content:Logical  History of Schizophrenia/Schizoaffective disorder:No  Duration of Psychotic Symptoms:N/A  Hallucinations:Denies Ideas of Reference:None  Suicidal Thoughts:Denies current SI Homicidal Thoughts:Denies  Sensorium  Memory:Immediate Good  Judgment:Impaired  Insight:Poor   Executive Functions  Concentration:Fair  Attention Span:Fair  Recall:Fair  Fund of Knowledge:Fair  Language:Fair   Psychomotor Activity  Psychomotor Activity: Reduced.  Assets  Assets:Resilience; Desire for Improvement   Sleep  Sleep: No data recorded   Physical Exam: Physical Exam Constitutional:      Appearance: Normal appearance. He is normal weight.  HENT:     Head: Normocephalic and atraumatic.     Nose: Nose normal.  Eyes:     Extraocular Movements: Extraocular movements intact.     Conjunctiva/sclera: Conjunctivae normal.     Pupils: Pupils are equal, round, and reactive to light.  Cardiovascular:     Rate and Rhythm: Normal rate and regular rhythm.     Pulses: Normal pulses.     Heart sounds: Normal heart sounds.  Pulmonary:     Effort: Pulmonary effort is normal.     Breath sounds: Normal breath sounds.  Abdominal:     General: Abdomen is flat. Bowel sounds are normal.     Palpations: Abdomen is soft.  Musculoskeletal:        General: Normal range of motion.     Cervical back: Normal range of motion and neck supple.  Skin:    General: Skin is warm and dry.  Neurological:     General: No focal deficit present.     Mental Status: He is alert.  Psychiatric:        Behavior: Behavior normal.   Review of Systems  Constitutional:  Negative.   HENT: Negative.    Eyes: Negative.   Respiratory: Negative.    Cardiovascular: Negative.   Gastrointestinal: Negative.   Genitourinary: Negative.   Musculoskeletal: Negative.   Skin: Negative.   Neurological: Negative.   Endo/Heme/Allergies: Negative.   Psychiatric/Behavioral:  Positive for depression and suicidal ideas. The patient is nervous/anxious.   Blood pressure (!) 108/58, pulse 76, temperature 98.1 F (36.7 C), temperature source Oral, resp. rate 18, height 5\' 9"  (1.753 m), weight 94.3 kg, SpO2 99 %. Body mass index is 30.72 kg/m.   Treatment Plan Summary: Daily contact with patient to assess and evaluate symptoms and progress in treatment, Medication management, and Plan : Continue current doses of Zoloft and Seroquel.   01/19/2021, 9:50 AM

## 2021-01-20 DIAGNOSIS — F332 Major depressive disorder, recurrent severe without psychotic features: Secondary | ICD-10-CM | POA: Diagnosis not present

## 2021-01-20 MED ORDER — QUETIAPINE FUMARATE 25 MG PO TABS
150.0000 mg | ORAL_TABLET | Freq: Every day | ORAL | Status: DC
  Administered 2021-01-20 – 2021-01-23 (×4): 150 mg via ORAL
  Filled 2021-01-20 (×4): qty 1

## 2021-01-20 MED ORDER — LOPERAMIDE HCL 2 MG PO CAPS
4.0000 mg | ORAL_CAPSULE | ORAL | Status: DC | PRN
  Administered 2021-01-20 – 2021-01-23 (×2): 4 mg via ORAL
  Filled 2021-01-20 (×2): qty 2

## 2021-01-20 NOTE — Progress Notes (Signed)
Recreation Therapy Notes  Date: 01/20/2021  Time: 10:00 am   Location: Courtyard    Behavioral response: N/A   Intervention Topic: Wellness   Discussion/Intervention: Patient did not attend group.   Clinical Observations/Feedback:  Patient did not attend group.   Tai Syfert LRT/CTRS       Jasir Rother 01/20/2021 11:38 AM

## 2021-01-20 NOTE — B[facility] Counselor (Addendum)
CSW completed referrals for pt to the following facilities:  [facility]  [facility]  CSW faxed referrals to [facility] and confirmed receipt of fax. They stated they currently don't have bed availability but would continue with referral process. CSW will follow up regarding status within the next few days.  CSW will fax [facility] referral expediently upon completion of additional form requirements.   Meaghan Whistler [place], MSW, LCSW-A 9/26/20223:50 PM

## 2021-01-20 NOTE — Progress Notes (Signed)
[facility] MD Progress Note  01/20/2021 1:17 PM Nathan Koch  MRN:  [id number]  CC "Couldn't sleep last night."  Subjective:  30 year old man with a history of recurrent depression and substance abuse came to the hospital reporting suicidal ideation. No acute events overnight, medication compliant, attending to ADLs. Patient seen one-on-one this morning. He reports he has a hard time falling asleep and staying asleep overnight secondary to anxiety and racing thoughts. He reports he typically takes Seroquel 150 mg at home, and requests increase. This has been done. Otherwise continues to feel depressed, and feels hopeless about marriage and living situation. He was previously 9 months sober with [facility] program, and regrets leaving early. He is hoping to reenter a residential substance abuse treatment program. Several applications have been submitted by CSW team.  Principal Problem: Severe recurrent major depression without psychotic features ([name]) Diagnosis: Principal Problem:   Severe recurrent major depression without psychotic features ([name]) Active Problems:   Methamphetamine use disorder, moderate ([name])   Suicidal ideation   Self-inflicted laceration of left wrist ([name])  Total Time spent with patient: 30 minutes  Past Psychiatric History: See H&P  Past Medical History:  Past Medical History:  Diagnosis Date   Anxiety    Anxiety disorder    Asthma    Bipolar 1 disorder ([name])    Depression    Methamphetamine abuse ([name])    PTSD (post-traumatic stress disorder)     Past Surgical History:  Procedure Laterality Date   CLOSED REDUCTION MANDIBLE N/A 07/02/2018   Procedure: CLOSED REDUCTION MANDIBULAR;  Surgeon: Vernie Murders, MD;  Location: [facility] ORS;  Service: ENT;  Laterality: N/A;   Family History:  Family History  Problem Relation Age of Onset   Asthma Mother    Cancer Mother        breast cancer   Ulcers Mother    Cancer Father        esophogeal cancer   Hypertension Father     Asthma Brother    Family Psychiatric  History: See H&P Social History:  Social History   Substance and Sexual Activity  Alcohol Use Not Currently   Alcohol/week: 13.0 standard drinks   Types: 6 Cans of beer, 7 Standard drinks or equivalent per week   Comment: previously heavy drinker 2016. most recent 4 beer/ day drinker and none since 01-11-2020     Social History   Substance and Sexual Activity  Drug Use Yes   Types: Methamphetamines   Comment: states he uses 1 g every other day    Social History   Socioeconomic History   Marital status: Single    Spouse name: Not on file   Number of children: 1   Years of education: 10   Highest education level: 10th grade  Occupational History   Not on file  Tobacco Use   Smoking status: Every Day    Packs/day: 0.50    Years: 8.00    Pack years: 4.00    Types: Cigarettes   Smokeless tobacco: Never  Vaping Use   Vaping Use: Every day   Substances: Nicotine, Flavoring  Substance and Sexual Activity   Alcohol use: Not Currently    Alcohol/week: 13.0 standard drinks    Types: 6 Cans of beer, 7 Standard drinks or equivalent per week    Comment: previously heavy drinker 2016. most recent 4 beer/ day drinker and none since 01-11-2020   Drug use: Yes    Types: Methamphetamines    Comment: states he uses  1 g every other day   Sexual activity: Not on file  Other Topics Concern   Not on file  Social History Narrative   Not on file   Social Determinants of Health   Financial Resource Strain: Not on file  Food Insecurity: Not on file  Transportation Needs: Not on file  Physical Activity: Not on file  Stress: Not on file  Social Connections: Not on file   Additional Social History:                         Sleep: Fair  Appetite:  Fair  Current Medications: Current Facility-Administered Medications  Medication Dose Route Frequency Provider Last Rate Last Admin   acetaminophen (TYLENOL) tablet 650 mg  650 mg Oral  Q6H PRN Clapacs, [name], MD       albuterol (VENTOLIN HFA) 108 (90 Base) MCG/ACT inhaler 2 puff  2 puff Inhalation Q4H PRN Clapacs, [name], MD   2 puff at 01/20/21 0801   alum & mag hydroxide-simeth (MAALOX/MYLANTA) 200-200-20 MG/5ML suspension 30 mL  30 mL Oral Q4H PRN Clapacs, [name], MD       feeding supplement (ENSURE ENLIVE / ENSURE PLUS) liquid 237 mL  237 mL Oral BID BM Clapacs, [name], MD   237 mL at 01/19/21 1702   hydrOXYzine (ATARAX/VISTARIL) tablet 25 mg  25 mg Oral TID PRN Clapacs, [name], MD   25 mg at 01/19/21 2008   magnesium hydroxide (MILK OF MAGNESIA) suspension 30 mL  30 mL Oral Daily PRN Clapacs, Jackquline Denmark, MD       multivitamin with minerals tablet 1 tablet  1 tablet Oral Daily Clapacs, Jackquline Denmark, MD   1 tablet at 01/20/21 0801   nicotine (NICODERM CQ - dosed in mg/24 hours) patch 14 mg  14 mg Transdermal Daily Clapacs, [name], MD       QUEtiapine (SEROQUEL) tablet 150 mg  150 mg Oral QHS Jesse Sans, MD       sertraline (ZOLOFT) tablet 50 mg  50 mg Oral QHS Clapacs, [name], MD   50 mg at 01/19/21 2009   traZODone (DESYREL) tablet 25 mg  25 mg Oral QHS PRN Clapacs, Jackquline Denmark, MD   25 mg at 01/19/21 2009    Lab Results: No results found for this or any previous visit (from the past 48 hour(s)).  Blood Alcohol level:  Lab Results  Component Value Date   ETH <10 01/16/2021   ETH <10 01/14/2021    Metabolic Disorder Labs: Lab Results  Component Value Date   HGBA1C 6.0 (H) 01/17/2021   MPG 126 01/17/2021   MPG 126 10/08/2020   No results found for: PROLACTIN Lab Results  Component Value Date   CHOL 94 01/17/2021   TRIG 100 01/17/2021   HDL 28 (L) 01/17/2021   CHOLHDL 3.4 01/17/2021   VLDL 20 01/17/2021   LDLCALC 46 01/17/2021   LDLCALC 49 10/08/2020    Physical Findings: AIMS:  , ,  ,  ,    CIWA:    COWS:     Musculoskeletal: Strength & Muscle Tone: within normal limits Gait & Station: normal Patient leans: N/A  Psychiatric Specialty  Exam:  Presentation  General Appearance: Casual; Appropriate for Environment  Eye Contact:Fair  Speech:Clear and Coherent  Speech Volume:Normal  Handedness:Right   Mood and Affect  Mood:Anxious; Depressed  Affect:Congruent   Thought Process  Thought Processes:Coherent  Descriptions of Associations:Intact  Orientation:Full (Time, Place and Person)  Thought Content:Logical  History of Schizophrenia/Schizoaffective disorder:No  Duration of Psychotic Symptoms:N/A  Hallucinations:Hallucinations: None  Ideas of Reference:None  Suicidal Thoughts:Suicidal Thoughts: Yes, Passive  Homicidal Thoughts:Homicidal Thoughts: No   Sensorium  Memory:Immediate Good; Recent Good; Remote Good  Judgment:Intact  Insight:Present   Executive Functions  Concentration:Fair  Attention Span:Fair  Recall:Fair  Fund of Knowledge:Fair  Language:Fair   Psychomotor Activity  Psychomotor Activity:Psychomotor Activity: Normal   Assets  Assets:Communication Skills; Desire for Improvement; Physical Health; Resilience   Sleep  Sleep:Sleep: Good Number of Hours of Sleep: 8    Physical Exam: Physical Exam ROS Blood pressure (!) 101/54, pulse 63, temperature 97.8 F (36.6 C), temperature source Oral, resp. rate 18, height 5\' 9"  (1.753 m), weight 94.3 kg, SpO2 95 %. Body mass index is 30.72 kg/m.   Treatment Plan Summary: Daily contact with patient to assess and evaluate symptoms and progress in treatment and Medication management 30 year old man with a history of recurrent depression and substance abuse came to the hospital reporting suicidal ideation. Continue Zoloft 50 mg daily, increase Seroquel 150 mg nightly. Patient desire residential SA tx at discharge, applications have been submitted by CSW team.   37, MD 01/20/2021, 1:17 PM

## 2021-01-20 NOTE — Group Note (Signed)
[facility] LCSW Group Therapy Note    Group Date: 01/20/2021 Start Time: 1306 End Time: 1400  Type of Therapy and Topic:  Group Therapy:  Overcoming Obstacles  Participation Level:  [facility] PARTICIPATION LEVEL: Minimal  Mood:  Description of Group:   In this group patients will be encouraged to explore what they see as obstacles to their own wellness and recovery. They will be guided to discuss their thoughts, feelings, and behaviors related to these obstacles. The group will process together ways to cope with barriers, with attention given to specific choices patients can make. Each patient will be challenged to identify changes they are motivated to make in order to overcome their obstacles. This group will be process-oriented, with patients participating in exploration of their own experiences as well as giving and receiving support and challenge from other group members.  Therapeutic Goals: 1. Patient will identify personal and current obstacles as they relate to admission. 2. Patient will identify barriers that currently interfere with their wellness or overcoming obstacles.  3. Patient will identify feelings, thought process and behaviors related to these barriers. 4. Patient will identify two changes they are willing to make to overcome these obstacles:    Summary of Patient Progress Pt was present for the entirety of the group. He shared his personal obstacle of uncertainty. Outside of this he did not share much but did appear to listen/attend to the conversation.   Therapeutic Modalities:   Cognitive Behavioral Therapy Solution Focused Therapy Motivational Interviewing Relapse Prevention Therapy   Glenis Smoker, LCSW

## 2021-01-20 NOTE — Plan of Care (Signed)
Patient stated that his anxiety is the same but he does not know any triggering reason for it. Patient in & out of room. Pleasant and cooperative on approach. Stated that he could not sleep last night. Denies SI,HI and AVH.Appeite and energy level good. ADLs maintained. Attended groups. Support and encouragement given.

## 2021-01-21 ENCOUNTER — Inpatient Hospital Stay: Payer: 59

## 2021-01-21 DIAGNOSIS — F332 Major depressive disorder, recurrent severe without psychotic features: Secondary | ICD-10-CM | POA: Diagnosis not present

## 2021-01-21 MED ORDER — BACITRACIN-NEOMYCIN-POLYMYXIN 400-5-5000 EX OINT
TOPICAL_OINTMENT | Freq: Two times a day (BID) | CUTANEOUS | Status: DC
  Filled 2021-01-21 (×7): qty 1

## 2021-01-21 NOTE — Progress Notes (Signed)
Recreation Therapy Notes   Date: 01/21/2021  Time: 10:00 am   Location: Courtyard    Behavioral response: N/A   Intervention Topic: Leisure    Discussion/Intervention: Patient did not attend group.   Clinical Observations/Feedback:  Patient did not attend group.   Zalyn Amend LRT/CTRS         Almyra Birman 01/21/2021 11:57 AM

## 2021-01-21 NOTE — [facility] Counselor (Signed)
CSW completed fax referral to [facility].  CSW spoke with Joe at intake, [phone], who stated that he received the referral and would let CSW know as early as tomorrow afternoon regarding pt's admission.   He also stated that they had some male beds available.   CSW will follow up tomorrow afternoon regarding pt referral status.   Nathan Koch, MSW, LCSW-A 9/27/20222:09 PM

## 2021-01-21 NOTE — Plan of Care (Signed)
  Problem: Education: Goal: Knowledge of [facility] Education information/materials will improve Outcome: Progressing Goal: Emotional status will improve Outcome: Progressing Goal: Mental status will improve Outcome: Progressing Goal: Verbalization of understanding the information provided will improve Outcome: Progressing   Problem: Activity: Goal: Interest or engagement in activities will improve Outcome: Progressing Goal: Sleeping patterns will improve Outcome: Progressing   Problem: Coping: Goal: Ability to verbalize frustrations and anger appropriately will improve Outcome: Progressing Goal: Ability to demonstrate self-control will improve Outcome: Progressing   Problem: Health Behavior/Discharge Planning: Goal: Identification of resources available to assist in meeting health care needs will improve Outcome: Progressing Goal: Compliance with treatment plan for underlying cause of condition will improve Outcome: Progressing   Problem: Physical Regulation: Goal: Ability to maintain clinical measurements within normal limits will improve Outcome: Progressing   Problem: Safety: Goal: Periods of time without injury will increase Outcome: Progressing   Problem: Education: Goal: Utilization of techniques to improve thought processes will improve Outcome: Progressing Goal: Knowledge of the prescribed therapeutic regimen will improve Outcome: Progressing   Problem: Activity: Goal: Interest or engagement in leisure activities will improve Outcome: Progressing Goal: Imbalance in normal sleep/wake cycle will improve Outcome: Progressing   Problem: Coping: Goal: Coping ability will improve Outcome: Progressing Goal: Will verbalize feelings Outcome: Progressing   Problem: Health Behavior/Discharge Planning: Goal: Ability to make decisions will improve Outcome: Progressing Goal: Compliance with therapeutic regimen will improve Outcome: Progressing    Problem: Role Relationship: Goal: Will demonstrate positive changes in social behaviors and relationships Outcome: Progressing   Problem: Safety: Goal: Ability to disclose and discuss suicidal ideas will improve Outcome: Progressing Goal: Ability to identify and utilize support systems that promote safety will improve Outcome: Progressing   Problem: Self-Concept: Goal: Will verbalize positive feelings about self Outcome: Progressing Goal: Level of anxiety will decrease Outcome: Progressing   

## 2021-01-21 NOTE — Progress Notes (Signed)
Patient has been pleasant and talkative. Expressed displeasure with the way he was treated at [facility] and said he felt like he had received good care while here. Denies SI, HI and AVH. Wanting to get into a rehab program after discharge

## 2021-01-21 NOTE — Progress Notes (Signed)
[facility] MD Progress Note  01/21/2021 11:22 AM Nathan Koch  MRN:  [id number]  CC "Got an injury to my foot."  Subjective:  30 year old man with a history of recurrent depression and substance abuse came to the hospital reporting suicidal ideation. No acute events overnight, medication compliant, attending to ADLs. Patient seen one-on-one today. He was reporting some continued difficult sleeping, but felt it was improved. He continues to have some passive SI. Denies any HI/AH/VH. He also brings up that he has an injury to his right great toe from walking a lot prior to admission. On physical exam there is an abrasion with scab and bruising. He is having pain with ambulation. Will order right foot xray, and provide neosporin to apply topically.  Principal Problem: Severe recurrent major depression without psychotic features ([name]) Diagnosis: Principal Problem:   Severe recurrent major depression without psychotic features ([name]) Active Problems:   Methamphetamine use disorder, moderate ([name])   Suicidal ideation   Self-inflicted laceration of left wrist ([name])  Total Time spent with patient: 30 minutes  Past Psychiatric History: See H&P  Past Medical History:  Past Medical History:  Diagnosis Date   Anxiety    Anxiety disorder    Asthma    Bipolar 1 disorder ([name])    Depression    Methamphetamine abuse ([name])    PTSD (post-traumatic stress disorder)     Past Surgical History:  Procedure Laterality Date   CLOSED REDUCTION MANDIBLE N/A 07/02/2018   Procedure: CLOSED REDUCTION MANDIBULAR;  Surgeon: Vernie Murders, MD;  Location: [facility] ORS;  Service: ENT;  Laterality: N/A;   Family History:  Family History  Problem Relation Age of Onset   Asthma Mother    Cancer Mother        breast cancer   Ulcers Mother    Cancer Father        esophogeal cancer   Hypertension Father    Asthma Brother    Family Psychiatric  History: See H&P Social History:  Social History   Substance and Sexual  Activity  Alcohol Use Not Currently   Alcohol/week: 13.0 standard drinks   Types: 6 Cans of beer, 7 Standard drinks or equivalent per week   Comment: previously heavy drinker 2016. most recent 4 beer/ day drinker and none since 01-11-2020     Social History   Substance and Sexual Activity  Drug Use Yes   Types: Methamphetamines   Comment: states he uses 1 g every other day    Social History   Socioeconomic History   Marital status: Single    Spouse name: Not on file   Number of children: 1   Years of education: 10   Highest education level: 10th grade  Occupational History   Not on file  Tobacco Use   Smoking status: Every Day    Packs/day: 0.50    Years: 8.00    Pack years: 4.00    Types: Cigarettes   Smokeless tobacco: Never  Vaping Use   Vaping Use: Every day   Substances: Nicotine, Flavoring  Substance and Sexual Activity   Alcohol use: Not Currently    Alcohol/week: 13.0 standard drinks    Types: 6 Cans of beer, 7 Standard drinks or equivalent per week    Comment: previously heavy drinker 2016. most recent 4 beer/ day drinker and none since 01-11-2020   Drug use: Yes    Types: Methamphetamines    Comment: states he uses 1 g every other day   Sexual activity:  Not on file  Other Topics Concern   Not on file  Social History Narrative   Not on file   Social Determinants of Health   Financial Resource Strain: Not on file  Food Insecurity: Not on file  Transportation Needs: Not on file  Physical Activity: Not on file  Stress: Not on file  Social Connections: Not on file   Additional Social History:                         Sleep: Fair  Appetite:  Fair  Current Medications: Current Facility-Administered Medications  Medication Dose Route Frequency Provider Last Rate Last Admin   acetaminophen (TYLENOL) tablet 650 mg  650 mg Oral Q6H PRN Clapacs, [name], MD   650 mg at 01/20/21 2133   albuterol (VENTOLIN HFA) 108 (90 Base) MCG/ACT inhaler 2  puff  2 puff Inhalation Q4H PRN Clapacs, [name], MD   2 puff at 01/21/21 1119   alum & mag hydroxide-simeth (MAALOX/MYLANTA) 200-200-20 MG/5ML suspension 30 mL  30 mL Oral Q4H PRN Clapacs, [name], MD       feeding supplement (ENSURE ENLIVE / ENSURE PLUS) liquid 237 mL  237 mL Oral BID BM Clapacs, [name], MD   237 mL at 01/21/21 1019   hydrOXYzine (ATARAX/VISTARIL) tablet 25 mg  25 mg Oral TID PRN Clapacs, Jackquline Denmark, MD   25 mg at 01/19/21 2008   loperamide (IMODIUM) capsule 4 mg  4 mg Oral Q3H PRN Jesse Sans, MD   4 mg at 01/20/21 1846   magnesium hydroxide (MILK OF MAGNESIA) suspension 30 mL  30 mL Oral Daily PRN Clapacs, Jackquline Denmark, MD       multivitamin with minerals tablet 1 tablet  1 tablet Oral Daily Clapacs, Jackquline Denmark, MD   1 tablet at 01/21/21 4481   neomycin-bacitracin-polymyxin (NEOSPORIN) ointment packet   Topical BID Jesse Sans, MD       nicotine (NICODERM CQ - dosed in mg/24 hours) patch 14 mg  14 mg Transdermal Daily Clapacs, [name], MD       QUEtiapine (SEROQUEL) tablet 150 mg  150 mg Oral QHS Jesse Sans, MD   150 mg at 01/20/21 2104   sertraline (ZOLOFT) tablet 50 mg  50 mg Oral QHS Clapacs, [name], MD   50 mg at 01/20/21 2105   traZODone (DESYREL) tablet 25 mg  25 mg Oral QHS PRN Clapacs, Jackquline Denmark, MD   25 mg at 01/19/21 2009    Lab Results: No results found for this or any previous visit (from the past 48 hour(s)).  Blood Alcohol level:  Lab Results  Component Value Date   ETH <10 01/16/2021   ETH <10 01/14/2021    Metabolic Disorder Labs: Lab Results  Component Value Date   HGBA1C 6.0 (H) 01/17/2021   MPG 126 01/17/2021   MPG 126 10/08/2020   No results found for: PROLACTIN Lab Results  Component Value Date   CHOL 94 01/17/2021   TRIG 100 01/17/2021   HDL 28 (L) 01/17/2021   CHOLHDL 3.4 01/17/2021   VLDL 20 01/17/2021   LDLCALC 46 01/17/2021   LDLCALC 49 10/08/2020    Physical Findings: AIMS:  , ,  ,  ,    CIWA:    COWS:      Musculoskeletal: Strength & Muscle Tone: within normal limits Gait & Station: normal Patient leans: N/A  Psychiatric Specialty Exam:  Presentation  General  Appearance: Casual; Appropriate for Environment  Eye Contact:Fair  Speech:Clear and Coherent  Speech Volume:Normal  Handedness:Right   Mood and Affect  Mood:Anxious; Depressed  Affect:Congruent   Thought Process  Thought Processes:Coherent  Descriptions of Associations:Intact  Orientation:Full (Time, Place and Person)  Thought Content:Logical  History of Schizophrenia/Schizoaffective disorder:No  Duration of Psychotic Symptoms:N/A  Hallucinations:Hallucinations: None  Ideas of Reference:None  Suicidal Thoughts:Suicidal Thoughts: Yes, Passive  Homicidal Thoughts:Homicidal Thoughts: No   Sensorium  Memory:Immediate Good; Recent Good; Remote Good  Judgment:Intact  Insight:Present   Executive Functions  Concentration:Fair  Attention Span:Fair  Recall:Fair  Fund of Knowledge:Fair  Language:Fair   Psychomotor Activity  Psychomotor Activity:Psychomotor Activity: Normal   Assets  Assets:Communication Skills; Desire for Improvement; Physical Health; Resilience   Sleep  Sleep:Sleep: Good Number of Hours of Sleep: 8    Physical Exam: Physical Exam Musculoskeletal:       Feet:  Feet:     Right foot:     Skin integrity: Ulcer, skin breakdown and dry skin present.   ROS Blood pressure 102/64, pulse 65, temperature 97.6 F (36.4 C), temperature source Oral, resp. rate 18, height 5\' 9"  (1.753 m), weight 94.3 kg, SpO2 100 %. Body mass index is 30.72 kg/m.   Treatment Plan Summary: Daily contact with patient to assess and evaluate symptoms and progress in treatment and Medication management 30 year old man with a history of recurrent depression and substance abuse came to the hospital reporting suicidal ideation. Continue Zoloft 50 mg daily,  Seroquel 150 mg nightly. Patient desire  residential SA tx at discharge, applications have been submitted by CSW team. Order right foot x-ray and provide topical neosporin to scabbed area.   37, MD 01/21/2021, 11:22 AM

## 2021-01-21 NOTE — Plan of Care (Signed)
Patient stated that he slept better last night.Patient more visible in the milieu.Patient states " my anxiety is a chronic one. I don't need any medicine for that." Denies SI,HI and AVH.Looking forward to go to the inpatient rehab program. Cleaned and Neosporin put on the right toe. Appetite and energy level good. Support and encouragement given.

## 2021-01-22 DIAGNOSIS — F332 Major depressive disorder, recurrent severe without psychotic features: Secondary | ICD-10-CM | POA: Diagnosis not present

## 2021-01-22 NOTE — Progress Notes (Signed)
Patient is pleasant and cooperative. Denies SI, HI and AVH. Says the seroquel is helping him sleep. No complaints. Still desiring long-term rehab

## 2021-01-22 NOTE — Group Note (Signed)
LCSW Group Therapy Note  Patient on unit tested positive for COVID, group not held as precaution. Awaiting guidance from infection prevention. Situation ongoing, CSW will continue to monitor and update note as more information becomes available.   Nathan Koch W Denny Mccree, LCSWA 01/22/2021  1:31 PM    

## 2021-01-22 NOTE — [facility] Counselor (Signed)
Patient provided with contact information for [facility] and residential substance use treatment facilities. CSW encouraged patient to call and follow up. CSW team will continue to pursue bed placement. Situation ongoing, CSW will continue to monitor and update note as more information becomes available.   Signed:  Corky Crafts, MSW, [place], LCASA 01/22/2021 12:11 PM

## 2021-01-22 NOTE — Progress Notes (Signed)
Recreation Therapy Notes  Date: 01/22/2021  Time: 10:00 am   Location: Craft room   Behavioral response: N/A   Intervention Topic: Time Management    Discussion/Intervention: Patient did not attend group.   Clinical Observations/Feedback:  Patient did not attend group.   Kennadee Walthour LRT/CTRS         Kylyn Sookram 01/22/2021 11:05 AM

## 2021-01-22 NOTE — Progress Notes (Signed)
D: Pt alert and oriented. Pt denies experiencing any anxiety/depression at this time. Pt reports experiencing any pain at this time. Pt denies experiencing any SI/HI, or AVH at this time.    A: Scheduled medications administered to pt, per MD orders. Support and encouragement provided. Frequent verbal contact made. Routine safety checks conducted q15 minutes.   R: No adverse drug reactions noted. Pt verbally contracts for safety at this time. Pt complaint with medications and treatment plan. Pt interacts well with others on the unit. Pt remains safe at this time. Will continue to monitor.

## 2021-01-22 NOTE — BH IP Treatment Plan (Signed)
Interdisciplinary Treatment and Diagnostic Plan Update  01/22/2021 Time of Session: 8:30AM Nathan Koch MRN: [id number]  Principal Diagnosis: Severe recurrent major depression without psychotic features ([name])  Secondary Diagnoses: Principal Problem:   Severe recurrent major depression without psychotic features ([name]) Active Problems:   Methamphetamine use disorder, moderate ([name])   Suicidal ideation   Self-inflicted laceration of left wrist ([name])   Current Medications:  Current Facility-Administered Medications  Medication Dose Route Frequency Provider Last Rate Last Admin   acetaminophen (TYLENOL) tablet 650 mg  650 mg Oral Q6H PRN Clapacs, [name], MD   650 mg at 01/20/21 2133   albuterol (VENTOLIN HFA) 108 (90 Base) MCG/ACT inhaler 2 puff  2 puff Inhalation Q4H PRN Clapacs, [name], MD   2 puff at 01/22/21 0811   alum & mag hydroxide-simeth (MAALOX/MYLANTA) 200-200-20 MG/5ML suspension 30 mL  30 mL Oral Q4H PRN Clapacs, [name], MD       feeding supplement (ENSURE ENLIVE / ENSURE PLUS) liquid 237 mL  237 mL Oral BID BM Clapacs, [name], MD   237 mL at 01/21/21 1423   hydrOXYzine (ATARAX/VISTARIL) tablet 25 mg  25 mg Oral TID PRN Clapacs, Jackquline Denmark, MD   25 mg at 01/21/21 1843   loperamide (IMODIUM) capsule 4 mg  4 mg Oral Q3H PRN Jesse Sans, MD   4 mg at 01/20/21 1846   magnesium hydroxide (MILK OF MAGNESIA) suspension 30 mL  30 mL Oral Daily PRN Clapacs, Jackquline Denmark, MD       multivitamin with minerals tablet 1 tablet  1 tablet Oral Daily Clapacs, Jackquline Denmark, MD   1 tablet at 01/22/21 0810   neomycin-bacitracin-polymyxin (NEOSPORIN) ointment packet   Topical BID Jesse Sans, MD   Given at 01/21/21 1723   nicotine (NICODERM CQ - dosed in mg/24 hours) patch 14 mg  14 mg Transdermal Daily Clapacs, [name], MD       QUEtiapine (SEROQUEL) tablet 150 mg  150 mg Oral QHS Jesse Sans, MD   150 mg at 01/21/21 2112   sertraline (ZOLOFT) tablet 50 mg  50 mg Oral QHS Clapacs, [name], MD   50  mg at 01/21/21 2111   traZODone (DESYREL) tablet 25 mg  25 mg Oral QHS PRN Clapacs, Jackquline Denmark, MD   25 mg at 01/19/21 2009   PTA Medications: Medications Prior to Admission  Medication Sig Dispense Refill Last Dose   albuterol (VENTOLIN HFA) 108 (90 Base) MCG/ACT inhaler Inhale 2 puffs into the lungs every 4 (four) hours as needed for wheezing or shortness of breath.      hydrOXYzine (ATARAX/VISTARIL) 25 MG tablet Take 1 tablet (25 mg total) by mouth 3 (three) times daily as needed for anxiety. (Patient not taking: No sig reported) 30 tablet 0    LORazepam (ATIVAN) 2 MG tablet Take 2 mg by mouth daily as needed for anxiety.      QUEtiapine (SEROQUEL) 100 MG tablet Take 1 tablet (100 mg total) by mouth at bedtime. (Patient taking differently: Take 200 mg by mouth at bedtime.) 30 tablet 0    sertraline (ZOLOFT) 50 MG tablet Take 1 tablet (50 mg total) by mouth at bedtime. (Patient not taking: No sig reported) 30 tablet 0    traZODone (DESYREL) 50 MG tablet Take 0.5 tablets (25 mg total) by mouth at bedtime as needed (may give at least 1 hour after bedtime Seroquel dose given if needed for sleep). (Patient not taking: No sig reported)  30 tablet 0     Patient Stressors: Neurosurgeon issue   Medication change or noncompliance   Substance abuse    Patient Strengths: Printmaker for treatment/growth   Treatment Modalities: Medication Management, Group therapy, Case management,  1 to 1 session with clinician, Psychoeducation, Recreational therapy.   Physician Treatment Plan for Primary Diagnosis: Severe recurrent major depression without psychotic features ([name]) Long Term Goal(s): Improvement in symptoms so as ready for discharge   Short Term Goals: Ability to identify and develop effective coping behaviors will improve Ability to maintain clinical measurements within normal limits will improve Ability to disclose and discuss suicidal ideas Ability to  demonstrate self-control will improve  Medication Management: Evaluate patient's response, side effects, and tolerance of medication regimen.  Therapeutic Interventions: 1 to 1 sessions, Unit Group sessions and Medication administration.  Evaluation of Outcomes: Progressing  Physician Treatment Plan for Secondary Diagnosis: Principal Problem:   Severe recurrent major depression without psychotic features ([name]) Active Problems:   Methamphetamine use disorder, moderate ([name])   Suicidal ideation   Self-inflicted laceration of left wrist ([name])  Long Term Goal(s): Improvement in symptoms so as ready for discharge   Short Term Goals: Ability to identify and develop effective coping behaviors will improve Ability to maintain clinical measurements within normal limits will improve Ability to disclose and discuss suicidal ideas Ability to demonstrate self-control will improve     Medication Management: Evaluate patient's response, side effects, and tolerance of medication regimen.  Therapeutic Interventions: 1 to 1 sessions, Unit Group sessions and Medication administration.  Evaluation of Outcomes: Progressing   RN Treatment Plan for Primary Diagnosis: Severe recurrent major depression without psychotic features ([name]) Long Term Goal(s): Knowledge of disease and therapeutic regimen to maintain health will improve  Short Term Goals: Ability to demonstrate self-control, Ability to participate in decision making will improve, Ability to verbalize feelings will improve, Ability to disclose and discuss suicidal ideas, Ability to identify and develop effective coping behaviors will improve, and Compliance with prescribed medications will improve  Medication Management: RN will administer medications as ordered by provider, will assess and evaluate patient's response and provide education to patient for prescribed medication. RN will report any adverse and/or side effects to prescribing  provider.  Therapeutic Interventions: 1 on 1 counseling sessions, Psychoeducation, Medication administration, Evaluate responses to treatment, Monitor vital signs and CBGs as ordered, Perform/monitor CIWA, COWS, AIMS and Fall Risk screenings as ordered, Perform wound care treatments as ordered.  Evaluation of Outcomes: Progressing   LCSW Treatment Plan for Primary Diagnosis: Severe recurrent major depression without psychotic features ([name]) Long Term Goal(s): Safe transition to appropriate next level of care at discharge, Engage patient in therapeutic group addressing interpersonal concerns.  Short Term Goals: Engage patient in aftercare planning with referrals and resources, Increase social support, Increase ability to appropriately verbalize feelings, Increase emotional regulation, Facilitate acceptance of mental health diagnosis and concerns, and Increase skills for wellness and recovery  Therapeutic Interventions: Assess for all discharge needs, 1 to 1 time with Social worker, Explore available resources and support systems, Assess for adequacy in community support network, Educate family and significant other(s) on suicide prevention, Complete Psychosocial Assessment, Interpersonal group therapy.  Evaluation of Outcomes: Progressing   Progress in Treatment: Attending groups: Yes. Participating in groups: Yes. Taking medication as prescribed: Yes. Toleration medication: Yes. Family/Significant other contact made: Yes, individual(s) contacted:  SPE completed with the patients girlfriend.  Patient understands diagnosis: Yes. Discussing patient identified problems/goals  with staff: Yes. Medical problems stabilized or resolved: Yes. Denies suicidal/homicidal ideation: Yes. Issues/concerns per patient self-inventory: No. Other: none  New problem(s) identified: No, Describe:  none.   New Short Term/Long Term Goal(s): detox, medication management for mood stabilization; elimination of SI  thoughts; development of comprehensive mental wellness/sobriety plan.  Update 01/22/2021: No changes at this time.    Patient Goals: "Just to get my mind right, get myself out of negative thinking, and get back into rehab." Update 01/22/2021: No changes at this time.    Discharge Plan or Barriers: CSW will assist pt with development of an appropriate aftercare/discharge plan. Pt voices interest in rehab.   Reason for Continuation of Hospitalization: Anxiety Depression Medication stabilization  Estimated Length of Stay:  TBD   Scribe for Treatment Team: Harden Mo, Alexander Mt 01/22/2021 9:41 AM

## 2021-01-22 NOTE — BHH Counselor (Signed)
CSW called intake coordinator Joe at [facility] to inform him regarding pt medication. CSW left VM. CSW will follow up tomorrow regarding pt's admission.   Nathan Koch, MSW, LCSW-A 9/28/20222:38 PM

## 2021-01-22 NOTE — Progress Notes (Signed)
[facility] MD Progress Note  01/22/2021 2:06 PM Nathan Koch  MRN:  [id number]  CC "I'm okay"  Subjective:  30 year old man with a history of recurrent depression and substance abuse came to the hospital reporting suicidal ideation. No acute events overnight, medication compliant, attending to ADLs. Patient seen one-on-one today. He reports sleeping better and appetite improving. He denies any SI/HI/AH/VH. He remains highly motivated to secure substance abuse treatment. Call completed with [facility] who does have male bed availability. Applications also completed for [facility] and [facility]. Patient also with list of phone numbers to facilities and [facility] as well. Xray results from yesterday discussed. Foot examined again today with healing blister intact without signs of infection.  Principal Problem: Severe recurrent major depression without psychotic features ([name]) Diagnosis: Principal Problem:   Severe recurrent major depression without psychotic features ([name]) Active Problems:   Methamphetamine use disorder, moderate ([name])   Suicidal ideation   Self-inflicted laceration of left wrist ([name])  Total Time spent with patient: 30 minutes  Past Psychiatric History: See H&P  Past Medical History:  Past Medical History:  Diagnosis Date   Anxiety    Anxiety disorder    Asthma    Bipolar 1 disorder ([name])    Depression    Methamphetamine abuse ([name])    PTSD (post-traumatic stress disorder)     Past Surgical History:  Procedure Laterality Date   CLOSED REDUCTION MANDIBLE N/A 07/02/2018   Procedure: CLOSED REDUCTION MANDIBULAR;  Surgeon: Vernie Murders, MD;  Location: [facility] ORS;  Service: ENT;  Laterality: N/A;   Family History:  Family History  Problem Relation Age of Onset   Asthma Mother    Cancer Mother        breast cancer   Ulcers Mother    Cancer Father        esophogeal cancer   Hypertension Father    Asthma Brother    Family Psychiatric  History: See H&P Social History:  Social  History   Substance and Sexual Activity  Alcohol Use Not Currently   Alcohol/week: 13.0 standard drinks   Types: 6 Cans of beer, 7 Standard drinks or equivalent per week   Comment: previously heavy drinker 2016. most recent 4 beer/ day drinker and none since 01-11-2020     Social History   Substance and Sexual Activity  Drug Use Yes   Types: Methamphetamines   Comment: states he uses 1 g every other day    Social History   Socioeconomic History   Marital status: Single    Spouse name: Not on file   Number of children: 1   Years of education: 10   Highest education level: 10th grade  Occupational History   Not on file  Tobacco Use   Smoking status: Every Day    Packs/day: 0.50    Years: 8.00    Pack years: 4.00    Types: Cigarettes   Smokeless tobacco: Never  Vaping Use   Vaping Use: Every day   Substances: Nicotine, Flavoring  Substance and Sexual Activity   Alcohol use: Not Currently    Alcohol/week: 13.0 standard drinks    Types: 6 Cans of beer, 7 Standard drinks or equivalent per week    Comment: previously heavy drinker 2016. most recent 4 beer/ day drinker and none since 01-11-2020   Drug use: Yes    Types: Methamphetamines    Comment: states he uses 1 g every other day   Sexual activity: Not on file  Other Topics Concern  Not on file  Social History Narrative   Not on file   Social Determinants of Health   Financial Resource Strain: Not on file  Food Insecurity: Not on file  Transportation Needs: Not on file  Physical Activity: Not on file  Stress: Not on file  Social Connections: Not on file   Additional Social History:                         Sleep: Fair  Appetite:  Fair  Current Medications: Current Facility-Administered Medications  Medication Dose Route Frequency Provider Last Rate Last Admin   acetaminophen (TYLENOL) tablet 650 mg  650 mg Oral Q6H PRN Clapacs, [name], MD   650 mg at 01/20/21 2133   albuterol (VENTOLIN HFA)  108 (90 Base) MCG/ACT inhaler 2 puff  2 puff Inhalation Q4H PRN Clapacs, [name], MD   2 puff at 01/22/21 0811   alum & mag hydroxide-simeth (MAALOX/MYLANTA) 200-200-20 MG/5ML suspension 30 mL  30 mL Oral Q4H PRN Clapacs, [name], MD       feeding supplement (ENSURE ENLIVE / ENSURE PLUS) liquid 237 mL  237 mL Oral BID BM Clapacs, [name], MD   237 mL at 01/21/21 1423   hydrOXYzine (ATARAX/VISTARIL) tablet 25 mg  25 mg Oral TID PRN Clapacs, Jackquline Denmark, MD   25 mg at 01/21/21 1843   loperamide (IMODIUM) capsule 4 mg  4 mg Oral Q3H PRN Jesse Sans, MD   4 mg at 01/20/21 1846   magnesium hydroxide (MILK OF MAGNESIA) suspension 30 mL  30 mL Oral Daily PRN Clapacs, Jackquline Denmark, MD       multivitamin with minerals tablet 1 tablet  1 tablet Oral Daily Clapacs, Jackquline Denmark, MD   1 tablet at 01/22/21 0810   neomycin-bacitracin-polymyxin (NEOSPORIN) ointment packet   Topical BID Jesse Sans, MD   Given at 01/21/21 1723   nicotine (NICODERM CQ - dosed in mg/24 hours) patch 14 mg  14 mg Transdermal Daily Clapacs, [name], MD       QUEtiapine (SEROQUEL) tablet 150 mg  150 mg Oral QHS Jesse Sans, MD   150 mg at 01/21/21 2112   sertraline (ZOLOFT) tablet 50 mg  50 mg Oral QHS Clapacs, Jackquline Denmark, MD   50 mg at 01/21/21 2111   traZODone (DESYREL) tablet 25 mg  25 mg Oral QHS PRN Clapacs, Jackquline Denmark, MD   25 mg at 01/19/21 2009    Lab Results: No results found for this or any previous visit (from the past 48 hour(s)).  Blood Alcohol level:  Lab Results  Component Value Date   ETH <10 01/16/2021   ETH <10 01/14/2021    Metabolic Disorder Labs: Lab Results  Component Value Date   HGBA1C 6.0 (H) 01/17/2021   MPG 126 01/17/2021   MPG 126 10/08/2020   No results found for: PROLACTIN Lab Results  Component Value Date   CHOL 94 01/17/2021   TRIG 100 01/17/2021   HDL 28 (L) 01/17/2021   CHOLHDL 3.4 01/17/2021   VLDL 20 01/17/2021   LDLCALC 46 01/17/2021   LDLCALC 49 10/08/2020    Physical Findings: AIMS:  , ,   ,  ,    CIWA:    COWS:     Musculoskeletal: Strength & Muscle Tone: within normal limits Gait & Station: normal Patient leans: N/A  Psychiatric Specialty Exam:  Presentation  General Appearance: Casual; Appropriate for Environment  Eye  Contact:Fair  Speech:Clear and Coherent  Speech Volume:Normal  Handedness:Right   Mood and Affect  Mood:Anxious; Depressed  Affect:Congruent   Thought Process  Thought Processes:Coherent  Descriptions of Associations:Intact  Orientation:Full (Time, Place and Person)  Thought Content:Logical  History of Schizophrenia/Schizoaffective disorder:No  Duration of Psychotic Symptoms:N/A  Hallucinations:Denies  Ideas of Reference:None  Suicidal Thoughts:Denies  Homicidal Thoughts:Denies   Sensorium  Memory:Immediate Good; Recent Good; Remote Good  Judgment:Intact  Insight:Present   Executive Functions  Concentration:Fair  Attention Span:Fair  Recall:Fair  Fund of Knowledge:Fair  Language:Fair   Psychomotor Activity  Psychomotor Activity:Normal   Assets  Assets:Communication Skills; Desire for Improvement; Physical Health; Resilience   Sleep  Sleep:Fair 7.25    Physical Exam:  ROS Blood pressure 100/60, pulse 62, temperature 97.9 F (36.6 C), temperature source Oral, resp. rate 18, height 5\' 9"  (1.753 m), weight 94.3 kg, SpO2 98 %. Body mass index is 30.72 kg/m.   Treatment Plan Summary: Daily contact with patient to assess and evaluate symptoms and progress in treatment and Medication management 30 year old man with a history of recurrent depression and substance abuse came to the hospital reporting suicidal ideation. Continue Zoloft 50 mg daily,  Seroquel 150 mg nightly. Patient desires residential SA tx at discharge, applications have been submitted by CSW team. Continue topical neosporin to scabbed area on right big toe. Xray negative.   38, MD 01/22/2021, 2:06 PM

## 2021-01-22 NOTE — Plan of Care (Signed)
  Problem: Education: Goal: Knowledge of [facility] Education information/materials will improve Outcome: Progressing Goal: Emotional status will improve Outcome: Progressing Goal: Mental status will improve Outcome: Progressing Goal: Verbalization of understanding the information provided will improve Outcome: Progressing   Problem: Activity: Goal: Interest or engagement in activities will improve Outcome: Progressing Goal: Sleeping patterns will improve Outcome: Progressing   Problem: Coping: Goal: Ability to verbalize frustrations and anger appropriately will improve Outcome: Progressing Goal: Ability to demonstrate self-control will improve Outcome: Progressing   Problem: Education: Goal: Utilization of techniques to improve thought processes will improve Outcome: Progressing Goal: Knowledge of the prescribed therapeutic regimen will improve Outcome: Progressing   

## 2021-01-22 NOTE — Progress Notes (Signed)
Patient is cooperative and pleasant. He is med compliant. He endorses anxiety and depression after speaking with wife on the phone. Reports that she is a Financial trader for him. Was better after receiving PRN anxiety medication.  He is active on the unit and visible in the dayroom interacting well others. He denies si/hi/avh. Will continue to monitor with q15 minute safety checks.    Cleo Butler-Nicholson, LPN

## 2021-01-22 NOTE — Plan of Care (Signed)
  Problem: Education: Goal: Knowledge of [facility] Education information/materials will improve Outcome: Progressing Goal: Emotional status will improve Outcome: Progressing Goal: Mental status will improve Outcome: Progressing Goal: Verbalization of understanding the information provided will improve Outcome: Progressing   Problem: Activity: Goal: Interest or engagement in activities will improve Outcome: Progressing Goal: Sleeping patterns will improve Outcome: Progressing   Problem: Coping: Goal: Ability to verbalize frustrations and anger appropriately will improve Outcome: Progressing Goal: Ability to demonstrate self-control will improve Outcome: Progressing   Problem: Health Behavior/Discharge Planning: Goal: Identification of resources available to assist in meeting health care needs will improve Outcome: Progressing Goal: Compliance with treatment plan for underlying cause of condition will improve Outcome: Progressing   Problem: Physical Regulation: Goal: Ability to maintain clinical measurements within normal limits will improve Outcome: Progressing   Problem: Safety: Goal: Periods of time without injury will increase Outcome: Progressing   Problem: Education: Goal: Utilization of techniques to improve thought processes will improve Outcome: Progressing Goal: Knowledge of the prescribed therapeutic regimen will improve Outcome: Progressing   Problem: Activity: Goal: Interest or engagement in leisure activities will improve Outcome: Progressing Goal: Imbalance in normal sleep/wake cycle will improve Outcome: Progressing   Problem: Coping: Goal: Coping ability will improve Outcome: Progressing Goal: Will verbalize feelings Outcome: Progressing   Problem: Health Behavior/Discharge Planning: Goal: Ability to make decisions will improve Outcome: Progressing Goal: Compliance with therapeutic regimen will improve Outcome: Progressing    Problem: Role Relationship: Goal: Will demonstrate positive changes in social behaviors and relationships Outcome: Progressing   Problem: Safety: Goal: Ability to disclose and discuss suicidal ideas will improve Outcome: Progressing Goal: Ability to identify and utilize support systems that promote safety will improve Outcome: Progressing   Problem: Self-Concept: Goal: Will verbalize positive feelings about self Outcome: Progressing Goal: Level of anxiety will decrease Outcome: Progressing   

## 2021-01-22 NOTE — Plan of Care (Signed)
°  Problem: Coping Skills °Goal: STG - Patient will identify 3 positive coping skills strategies to use post d/c within 5 recreation therapy group sessions °Description: STG - Patient will identify 3 positive coping skills strategies to use post d/c within 5 recreation therapy group sessions °Outcome: Not Progressing °  °

## 2021-01-22 NOTE — BHH Counselor (Signed)
CSW faxed pt referral to [facility].  CSW confirmed the receipt of the fax. [facility] staff stated that male beds would be available within the week or next week.  CSW will have patient contact [facility] and do a prescreen for admission.   Dazaria Macneill [place], MSW, LCSW-A 9/28/20223:28 PM

## 2021-01-23 ENCOUNTER — Other Ambulatory Visit: Payer: Self-pay

## 2021-01-23 ENCOUNTER — Ambulatory Visit: Payer: Self-pay | Admitting: Physician Assistant

## 2021-01-23 DIAGNOSIS — F332 Major depressive disorder, recurrent severe without psychotic features: Secondary | ICD-10-CM | POA: Diagnosis not present

## 2021-01-23 MED ORDER — SERTRALINE HCL 50 MG PO TABS: 50.0000 mg | ORAL_TABLET | Freq: Every day | ORAL | 1 refills | Status: DC

## 2021-01-23 MED ORDER — QUETIAPINE FUMARATE 50 MG PO TABS
150.0000 mg | ORAL_TABLET | Freq: Every day | ORAL | 0 refills | Status: DC
  Filled 2021-01-23: qty 21, 7d supply, fill #0

## 2021-01-23 MED ORDER — TRAZODONE HCL 50 MG PO TABS: 25.0000 mg | ORAL_TABLET | Freq: Every evening | ORAL | 1 refills | Status: DC | PRN

## 2021-01-23 MED ORDER — ALBUTEROL SULFATE HFA 108 (90 BASE) MCG/ACT IN AERS: 2.0000 | INHALATION_SPRAY | RESPIRATORY_TRACT | 1 refills | Status: DC | PRN

## 2021-01-23 MED ORDER — HYDROXYZINE HCL 25 MG PO TABS: 25.0000 mg | ORAL_TABLET | Freq: Three times a day (TID) | ORAL | 1 refills | Status: DC | PRN

## 2021-01-23 MED ORDER — SERTRALINE HCL 50 MG PO TABS
50.0000 mg | ORAL_TABLET | Freq: Every day | ORAL | 0 refills | Status: DC
  Filled 2021-01-23: qty 7, 7d supply, fill #0

## 2021-01-23 MED ORDER — HYDROXYZINE HCL 25 MG PO TABS
25.0000 mg | ORAL_TABLET | Freq: Three times a day (TID) | ORAL | 0 refills | Status: DC | PRN
  Filled 2021-01-23: qty 21, 7d supply, fill #0

## 2021-01-23 MED ORDER — TRAZODONE HCL 50 MG PO TABS
25.0000 mg | ORAL_TABLET | Freq: Every evening | ORAL | 0 refills | Status: DC | PRN
  Filled 2021-01-23: qty 4, 8d supply, fill #0

## 2021-01-23 MED ORDER — QUETIAPINE FUMARATE 150 MG PO TABS: 150.0000 mg | ORAL_TABLET | Freq: Every day | ORAL | 1 refills | Status: DC

## 2021-01-23 NOTE — Progress Notes (Signed)
D: Pt alert and oriented. Pt rates depression 7/10, hopelessness 0/10, and anxiety 7/10. Pt goal: "My recovery." Pt reports energy level as low and concentration as being good. Pt reports sleep last night as being good. Pt did receive medications for sleep and did find them helpful. Pt denies experiencing any pain at this time. Pt denies experiencing any SI/HI, or AVH at this time.   Pt has been able to be calm while on phone with wife today as well as use coping skills. Pt voices excitement in regards to discharging tomorrow. Pt has bags of clothing brought in to go with him upon discharge as well as printed prescription, medication supply, a key to the items in safe upstairs, and belongings that are locked up. Pt is aware of all that is needed for discharge.  A: Scheduled medications administered to pt, per MD orders. Support and encouragement provided. Frequent verbal contact made. Routine safety checks conducted q15 minutes.   R: No adverse drug reactions noted. Pt verbally contracts for safety at this time. Pt complaint with medications. Pt interacts well with others on the unit. Pt remains safe at this time. Will continue to monitor.

## 2021-01-23 NOTE — BHH Counselor (Signed)
CSW spoke with pt. He informed CSW that he was accepted to an [address] in [place], [employer].   Contact Information: [address] [place], [place] [address]. [phone]  CSW followed up with [facility] and confirmed that pt was accepted at facility. Staff member stated that he would put pt's name down and hold his bed for tomorrow. Discharge date tentatively scheduled for 01/24/21, morning discharge.   Jaiyana Canale [place], MSW, LCSW-A 9/29/20223:23 PM

## 2021-01-23 NOTE — BHH Counselor (Signed)
CSW spoke with Nathan Koch at admissions from [facility]. He stated that pt was declined admission to program. He stated that the medical and clinical staff felt that the program was not structured enough for pt.   Pt stated that he is still interested in [facility] and CSW provided their phone number so he could speak with admissions staff.   Pt said that he is open to [place] [facility] as well if [facility] declines admission.   Sahith Nurse [place], MSW, LCSW-A 9/29/202211:36 AM

## 2021-01-23 NOTE — Progress Notes (Signed)
[facility] MD Progress Note  01/23/2021 11:17 AM Nathan Koch  MRN:  [id number]  CC "I'm good."  Subjective:  30 year old man with a history of recurrent depression and substance abuse came to the hospital reporting suicidal ideation. No acute events overnight, medication compliant, attending to ADLs. Patient seen one-on-one today. He reports sleeping well and eating well. He denies SI/HI/AH/VH. He has a positive phone call with [facility] this morning and felt he was going to be admitted there.   Principal Problem: Severe recurrent major depression without psychotic features ([name]) Diagnosis: Principal Problem:   Severe recurrent major depression without psychotic features ([name]) Active Problems:   Methamphetamine use disorder, moderate ([name])   Suicidal ideation   Self-inflicted laceration of left wrist ([name])  Total Time spent with patient: 30 minutes  Past Psychiatric History: See H&P  Past Medical History:  Past Medical History:  Diagnosis Date   Anxiety    Anxiety disorder    Asthma    Bipolar 1 disorder ([name])    Depression    Methamphetamine abuse ([name])    PTSD (post-traumatic stress disorder)     Past Surgical History:  Procedure Laterality Date   CLOSED REDUCTION MANDIBLE N/A 07/02/2018   Procedure: CLOSED REDUCTION MANDIBULAR;  Surgeon: Vernie Murders, MD;  Location: [facility] ORS;  Service: ENT;  Laterality: N/A;   Family History:  Family History  Problem Relation Age of Onset   Asthma Mother    Cancer Mother        breast cancer   Ulcers Mother    Cancer Father        esophogeal cancer   Hypertension Father    Asthma Brother    Family Psychiatric  History: See H&P Social History:  Social History   Substance and Sexual Activity  Alcohol Use Not Currently   Alcohol/week: 13.0 standard drinks   Types: 6 Cans of beer, 7 Standard drinks or equivalent per week   Comment: previously heavy drinker 2016. most recent 4 beer/ day drinker and none since 01-11-2020     Social  History   Substance and Sexual Activity  Drug Use Yes   Types: Methamphetamines   Comment: states he uses 1 g every other day    Social History   Socioeconomic History   Marital status: Single    Spouse name: Not on file   Number of children: 1   Years of education: 10   Highest education level: 10th grade  Occupational History   Not on file  Tobacco Use   Smoking status: Every Day    Packs/day: 0.50    Years: 8.00    Pack years: 4.00    Types: Cigarettes   Smokeless tobacco: Never  Vaping Use   Vaping Use: Every day   Substances: Nicotine, Flavoring  Substance and Sexual Activity   Alcohol use: Not Currently    Alcohol/week: 13.0 standard drinks    Types: 6 Cans of beer, 7 Standard drinks or equivalent per week    Comment: previously heavy drinker 2016. most recent 4 beer/ day drinker and none since 01-11-2020   Drug use: Yes    Types: Methamphetamines    Comment: states he uses 1 g every other day   Sexual activity: Not on file  Other Topics Concern   Not on file  Social History Narrative   Not on file   Social Determinants of Health   Financial Resource Strain: Not on file  Food Insecurity: Not on file  Transportation Needs: Not  on file  Physical Activity: Not on file  Stress: Not on file  Social Connections: Not on file   Additional Social History:      Sleep: Fair  Appetite:  Fair  Current Medications: Current Facility-Administered Medications  Medication Dose Route Frequency Provider Last Rate Last Admin   acetaminophen (TYLENOL) tablet 650 mg  650 mg Oral Q6H PRN Clapacs, [name], MD   650 mg at 01/20/21 2133   albuterol (VENTOLIN HFA) 108 (90 Base) MCG/ACT inhaler 2 puff  2 puff Inhalation Q4H PRN Clapacs, [name], MD   2 puff at 01/23/21 0636   alum & mag hydroxide-simeth (MAALOX/MYLANTA) 200-200-20 MG/5ML suspension 30 mL  30 mL Oral Q4H PRN Clapacs, [name], MD       feeding supplement (ENSURE ENLIVE / ENSURE PLUS) liquid 237 mL  237 mL Oral BID  BM Clapacs, [name], MD   237 mL at 01/23/21 1027   hydrOXYzine (ATARAX/VISTARIL) tablet 25 mg  25 mg Oral TID PRN Clapacs, Jackquline Denmark, MD   25 mg at 01/22/21 1949   loperamide (IMODIUM) capsule 4 mg  4 mg Oral Q3H PRN Jesse Sans, MD   4 mg at 01/23/21 0810   magnesium hydroxide (MILK OF MAGNESIA) suspension 30 mL  30 mL Oral Daily PRN Clapacs, Jackquline Denmark, MD       multivitamin with minerals tablet 1 tablet  1 tablet Oral Daily Clapacs, Jackquline Denmark, MD   1 tablet at 01/23/21 0810   neomycin-bacitracin-polymyxin (NEOSPORIN) ointment packet   Topical BID Jesse Sans, MD   Given at 01/23/21 [phone]   nicotine (NICODERM CQ - dosed in mg/24 hours) patch 14 mg  14 mg Transdermal Daily Clapacs, [name], MD       QUEtiapine (SEROQUEL) tablet 150 mg  150 mg Oral QHS Jesse Sans, MD   150 mg at 01/22/21 2109   sertraline (ZOLOFT) tablet 50 mg  50 mg Oral QHS Clapacs, Jackquline Denmark, MD   50 mg at 01/22/21 2109   traZODone (DESYREL) tablet 25 mg  25 mg Oral QHS PRN Clapacs, Jackquline Denmark, MD   25 mg at 01/22/21 2109    Lab Results: No results found for this or any previous visit (from the past 48 hour(s)).  Blood Alcohol level:  Lab Results  Component Value Date   ETH <10 01/16/2021   ETH <10 01/14/2021    Metabolic Disorder Labs: Lab Results  Component Value Date   HGBA1C 6.0 (H) 01/17/2021   MPG 126 01/17/2021   MPG 126 10/08/2020   No results found for: PROLACTIN Lab Results  Component Value Date   CHOL 94 01/17/2021   TRIG 100 01/17/2021   HDL 28 (L) 01/17/2021   CHOLHDL 3.4 01/17/2021   VLDL 20 01/17/2021   LDLCALC 46 01/17/2021   LDLCALC 49 10/08/2020    Physical Findings: AIMS:  , ,  ,  ,    CIWA:    COWS:     Musculoskeletal: Strength & Muscle Tone: within normal limits Gait & Station: normal Patient leans: N/A  Psychiatric Specialty Exam:  Presentation  General Appearance: Casual; Appropriate for Environment  Eye Contact:Fair  Speech:Clear and Coherent  Speech  Volume:Normal  Handedness:Right   Mood and Affect  Mood:Anxious; Depressed  Affect:Congruent   Thought Process  Thought Processes:Coherent  Descriptions of Associations:Intact  Orientation:Full (Time, Place and Person)  Thought Content:Logical  History of Schizophrenia/Schizoaffective disorder:No  Duration of Psychotic Symptoms:N/A  Hallucinations:Denies  Ideas of  Reference:None  Suicidal Thoughts:Denies  Homicidal Thoughts:Denies   Sensorium  Memory:Immediate Good; Recent Good; Remote Good  Judgment:Intact  Insight:Present   Executive Functions  Concentration:Fair  Attention Span:Fair  Recall:Fair  Fund of Knowledge:Fair  Language:Fair   Psychomotor Activity  Psychomotor Activity:Normal   Assets  Assets:Communication Skills; Desire for Improvement; Physical Health; Resilience   Sleep  Sleep:Fair 7.5    Physical Exam:  ROS Blood pressure 100/63, pulse 77, temperature 97.8 F (36.6 C), temperature source Oral, resp. rate 18, height 5\' 9"  (1.753 m), weight 94.3 kg, SpO2 98 %. Body mass index is 30.72 kg/m.   Treatment Plan Summary: Daily contact with patient to assess and evaluate symptoms and progress in treatment and Medication management 30 year old man with a history of recurrent depression and substance abuse came to the hospital reporting suicidal ideation. Continue Zoloft 50 mg daily,  Seroquel 150 mg nightly. Patient desires residential SA tx at discharge, applications have been submitted by CSW team. Continue topical neosporin to scabbed area on right big toe. Xray negative.   01/23/21: Psychiatric exam above reviewed and remains accurate. Assessment and plan above reviewed and updated.    01/25/21, MD 01/23/2021, 11:17 AM  Physical Exam

## 2021-01-23 NOTE — Progress Notes (Signed)
Recreation Therapy Notes   Date: 01/23/2021  Time: 10:00 am   Location: Craft room  Behavioral response: Appropriate  Intervention Topic: Goals   Discussion/Intervention:  Group content on today was focused on goals. Patients described what goals are and how they define goals. Individuals expressed how they go about setting goals and reaching them. The group identified how important goals are and if they make short term goals to reach long term goals. Patients described how many goals they work on at a time and what affects them not reaching their goal. Individuals described how much time they put into planning and obtaining their goals. The group participated in the intervention "My Goal Board" and made personal goal boards to help them achieve their goal. Clinical Observations/Feedback: Patient came to group late and was focused on what staff and peers had to say about setting goals. Individual was social with peers and staff while participating in the intervention. Kaydan Wilhoite LRT/CTRS         Gaelyn Tukes 01/23/2021 12:20 PM

## 2021-01-23 NOTE — BHH Counselor (Signed)
CSW spoke with Alinda Money at [facility], [phone], who stated that they have two beds available first come first served.   He said that pt must do the interview in person and agree to the rules and regulations of the program to enable admittance.   CSW will follow up with pt to determine interest and will follow up with PRM once plan is confirmed.   Teshara Moree [place], MSW, LCSW-A 9/29/20221:27 PM

## 2021-01-23 NOTE — BHH Counselor (Signed)
CSW followed up with [facility]. They stated that pt's admission process is being finalized.   CSW faxed progress notes to [facility] admissions so that facility would have the most updated information on pt.   CSW will follow up before end of day.   Nathan Koch, MSW, LCSW-A 9/29/20221:50 PM

## 2021-01-23 NOTE — Group Note (Signed)
[facility] LCSW Group Therapy Note   Group Date: 01/23/2021 Start Time: 1300 End Time: 1400   Type of Therapy/Topic:  Group Therapy:  Balance in Life  Participation Level:  Active   Description of Group:    This group will address the concept of balance and how it feels and looks when one is unbalanced. Patients will be encouraged to process areas in their lives that are out of balance, and identify reasons for remaining unbalanced. Facilitators will guide patients utilizing problem- solving interventions to address and correct the stressor making their life unbalanced. Understanding and applying boundaries will be explored and addressed for obtaining  and maintaining a balanced life. Patients will be encouraged to explore ways to assertively make their unbalanced needs known to significant others in their lives, using other group members and facilitator for support and feedback.  Therapeutic Goals: Patient will identify two or more emotions or situations they have that consume much of in their lives. Patient will identify signs/triggers that life has become out of balance:  Patient will identify two ways to set boundaries in order to achieve balance in their lives:  Patient will demonstrate ability to communicate their needs through discussion and/or role plays  Summary of Patient Progress: Patient was present and active in group.  Patient was supportive of other group members.  Patient shared that he feels "shaky, nervous, depressed and overwhelmed" when he feels that thinks are unbalanced.  He reports that he was triggered earlier in the day when he found on that he could not go to a desired residential facility.  Patient was able to identify the skills he used to deescalate.  CSW informed patient that the skill being used was Radical Acceptance.   Therapeutic Modalities:   Cognitive Behavioral Therapy Solution-Focused Therapy Assertiveness Training   Harden Mo, LCSW

## 2021-01-24 DIAGNOSIS — F332 Major depressive disorder, recurrent severe without psychotic features: Secondary | ICD-10-CM | POA: Diagnosis not present

## 2021-01-24 NOTE — Discharge Summary (Signed)
Physician Discharge Summary Note  Patient:  Nathan Koch is an 30 y.o., male MRN:  [id number] DOB:  1991/02/15 Patient phone:  [phone] (home)  Patient address:   [address] [place] [address],  Total Time spent with patient: 35 minutes- 25 minutes face-to-face contact with patient, 10 minutes documentation, coordination of care, scripts   Date of Admission:  01/16/2021 Date of Discharge: 01/24/2021  Reason for Admission:  30 year old man with a history of recurrent depression and substance abuse came to the hospital reporting suicidal ideation.   Principal Problem: Severe recurrent major depression without psychotic features [facility]) Discharge Diagnoses: Principal Problem:   Severe recurrent major depression without psychotic features ([name]) Active Problems:   Methamphetamine use disorder, moderate ([name])   Past Psychiatric History: Patient has had several prior hospitalizations and multiple visits to the ER.  Mood symptoms always complicated by substance abuse.  Has done reasonably well when he was in long-term recovery programs but back on the street has relapsed.  Positive for past cutting behavior quite a bit and for past suicidal behavior.   Past Medical History:  Past Medical History:  Diagnosis Date   Anxiety    Anxiety disorder    Asthma    Bipolar 1 disorder ([name])    Depression    Methamphetamine abuse ([name])    PTSD (post-traumatic stress disorder)     Past Surgical History:  Procedure Laterality Date   CLOSED REDUCTION MANDIBLE N/A 07/02/2018   Procedure: CLOSED REDUCTION MANDIBULAR;  Surgeon: Vernie Murders, MD;  Location: [facility] ORS;  Service: ENT;  Laterality: N/A;   Family History:  Family History  Problem Relation Age of Onset   Asthma Mother    Cancer Mother        breast cancer   Ulcers Mother    Cancer Father        esophogeal cancer   Hypertension Father    Asthma Brother    Family Psychiatric  History: None reported Social History:  Social  History   Substance and Sexual Activity  Alcohol Use Not Currently   Alcohol/week: 13.0 standard drinks   Types: 6 Cans of beer, 7 Standard drinks or equivalent per week   Comment: previously heavy drinker 2016. most recent 4 beer/ day drinker and none since 01-11-2020     Social History   Substance and Sexual Activity  Drug Use Yes   Types: Methamphetamines   Comment: states he uses 1 g every other day    Social History   Socioeconomic History   Marital status: Single    Spouse name: Not on file   Number of children: 1   Years of education: 10   Highest education level: 10th grade  Occupational History   Not on file  Tobacco Use   Smoking status: Every Day    Packs/day: 0.50    Years: 8.00    Pack years: 4.00    Types: Cigarettes   Smokeless tobacco: Never  Vaping Use   Vaping Use: Every day   Substances: Nicotine, Flavoring  Substance and Sexual Activity   Alcohol use: Not Currently    Alcohol/week: 13.0 standard drinks    Types: 6 Cans of beer, 7 Standard drinks or equivalent per week    Comment: previously heavy drinker 2016. most recent 4 beer/ day drinker and none since 01-11-2020   Drug use: Yes    Types: Methamphetamines    Comment: states he uses 1 g every other day   Sexual activity: Not  on file  Other Topics Concern   Not on file  Social History Narrative   Not on file   Social Determinants of Health   Financial Resource Strain: Not on file  Food Insecurity: Not on file  Transportation Needs: Not on file  Physical Activity: Not on file  Stress: Not on file  Social Connections: Not on file    Hospital Course:  30 year old man with a history of recurrent depression and substance abuse came to the hospital reporting suicidal ideation. We restarted sertraline 50 mg daily and also restarted quetiapine and increased to 150 mg daily. He completed detox without complications. He denies suicidal ideations, homicidal ideations, visual hallucinations,  auditory hallucinations. He plans to discharge to [facility] and continued outpatient mental health treatment.   Physical Findings: AIMS: Facial and Oral Movements Muscles of Facial Expression: None, normal Lips and Perioral Area: None, normal Jaw: None, normal Tongue: None, normal,Extremity Movements Upper (arms, wrists, hands, fingers): None, normal Lower (legs, knees, ankles, toes): None, normal, Trunk Movements Neck, shoulders, hips: None, normal, Overall Severity Severity of abnormal movements (highest score from questions above): None, normal Incapacitation due to abnormal movements: None, normal Patient's awareness of abnormal movements (rate only patient's report): No Awareness, Dental Status Current problems with teeth and/or dentures?: No Does patient usually wear dentures?: No  CIWA:    COWS:     Musculoskeletal: Strength & Muscle Tone: within normal limits Gait & Station: normal Patient leans: N/A   Psychiatric Specialty Exam:  Presentation  General Appearance: Appropriate for Environment; Casual  Eye Contact:Good  Speech:Clear and Coherent; Normal Rate  Speech Volume:Normal  Handedness:Right   Mood and Affect  Mood:Euthymic  Affect:Congruent   Thought Process  Thought Processes:Coherent; Goal Directed  Descriptions of Associations:Intact  Orientation:Full (Time, Place and Person)  Thought Content:Logical  History of Schizophrenia/Schizoaffective disorder:No  Duration of Psychotic Symptoms:N/A  Hallucinations:Hallucinations: None  Ideas of Reference:None  Suicidal Thoughts:Suicidal Thoughts: No  Homicidal Thoughts:Homicidal Thoughts: No   Sensorium  Memory:Immediate Good; Recent Good; Remote Good  Judgment:Intact  Insight:Present   Executive Functions  Concentration:Fair  Attention Span:Fair  Recall:Fair  Fund of Knowledge:Fair  Language:Fair   Psychomotor Activity  Psychomotor Activity:Psychomotor Activity:  Normal   Assets  Assets:Communication Skills; Desire for Improvement; Housing; Physical Health; Resilience; Social Support; Talents/Skills   Sleep  Sleep:Sleep: Good Number of Hours of Sleep: 7.5    Physical Exam: Physical Exam ROS Blood pressure 114/63, pulse 78, temperature 97.8 F (36.6 C), temperature source Oral, resp. rate 18, height 5\' 9"  (1.753 m), weight 94.3 kg, SpO2 99 %. Body mass index is 30.72 kg/m.   Social History   Tobacco Use  Smoking Status Every Day   Packs/day: 0.50   Years: 8.00   Pack years: 4.00   Types: Cigarettes  Smokeless Tobacco Never   Tobacco Cessation:  N/A, patient does not currently use tobacco products   Blood Alcohol level:  Lab Results  Component Value Date   ETH <10 01/16/2021   ETH <10 01/14/2021    Metabolic Disorder Labs:  Lab Results  Component Value Date   HGBA1C 6.0 (H) 01/17/2021   MPG 126 01/17/2021   MPG 126 10/08/2020   No results found for: PROLACTIN Lab Results  Component Value Date   CHOL 94 01/17/2021   TRIG 100 01/17/2021   HDL 28 (L) 01/17/2021   CHOLHDL 3.4 01/17/2021   VLDL 20 01/17/2021   LDLCALC 46 01/17/2021   LDLCALC 49 10/08/2020    See Psychiatric  Specialty Exam and Suicide Risk Assessment completed by Attending Physician prior to discharge.  Discharge destination:  Other:  [facility]  Is patient on multiple antipsychotic therapies at discharge:  No   Has Patient had three or more failed trials of antipsychotic monotherapy by history:  No  Recommended Plan for Multiple Antipsychotic Therapies: NA  Discharge Instructions     Diet general   Complete by: As directed    Increase activity slowly   Complete by: As directed       Allergies as of 01/24/2021   No Known Allergies      Medication List     STOP taking these medications    LORazepam 2 MG tablet Commonly known as: ATIVAN       TAKE these medications      Indication  albuterol 108 (90 Base) MCG/ACT  inhaler Commonly known as: VENTOLIN HFA Inhale 2 puffs into the lungs every 4 (four) hours as needed for wheezing or shortness of breath.  Indication: Spasm of Lung Air Passages   hydrOXYzine 25 MG tablet Commonly known as: ATARAX/VISTARIL Take 1 tablet (25 mg total) by mouth 3 (three) times daily as needed for anxiety.  Indication: Feeling Anxious   QUEtiapine 50 MG tablet Commonly known as: SEROQUEL Take 3 tablets (150 mg total) by mouth at bedtime. What changed:  medication strength how much to take  Indication: Major Depressive Disorder   sertraline 50 MG tablet Commonly known as: ZOLOFT Take 1 tablet (50 mg total) by mouth at bedtime.  Indication: Major Depressive Disorder   traZODone 50 MG tablet Commonly known as: DESYREL Take 1/2 tablet (25 mg) by mouth at bedtime as needed (may give at leat 1 hour after bedtime Quetiapine dose given if needed for sleep). (Take 0.5 tablets (25 mg total) by mouth at bedtime as needed (may give at least 1 hour after bedtime Seroquel dose given if needed for sleep).)  Indication: Major Depressive Disorder        Follow-up Information     [facility] Follow up.   Why: Here is an additional resource for outpatient mental health therapy. If needed, Please attend walk-in hours to scheduled your appointment for outpatient mental health services. Walk-in hours are Monday-Friday, 8am-3pm. Contact information: [address]  [address] [place] [address] [phone]         [facility] Follow up.   Specialty: [facility] information: [address] [place] [address] [phone]                 Follow-up recommendations:  Activity:  as tolerated Diet:  regular diet  Comments:  7-day supply of free medications, and we printed 30-day scripts with a refill. If suicidal ideations recur call 911 or proceed to nearest emergency room.   Signed: Jesse Sans, MD 01/24/2021,  9:27 AM

## 2021-01-24 NOTE — Progress Notes (Signed)
  [facility] Adult Case Management Discharge Plan :  Will you be returning to the same living situation after discharge:  No. At discharge, do you have transportation home?: Yes,  CSW to arrange transportation to [employer] Do you have the ability to pay for your medications: No.  Release of information consent forms completed and in the chart;  Patient's signature needed at discharge.  Patient to Follow up at:  Follow-up Information     [facility] Follow up.   Why: Here is an additional resource for outpatient mental health therapy. If needed, Please attend walk-in hours to scheduled your appointment for outpatient mental health services. Walk-in hours are Monday-Friday, 8am-3pm. Contact information: [address]  [address] [place] [address] [phone]         [facility] Follow up.   Specialty: Behavioral Health Why: Initial appointment is walk-in based. Their walk-in hourse are Monday-Friday, 6am-10:30am. Thanks! Contact information: [address] [place] [address] [phone]                 Next level of care provider has access to [facility] Link:no  Safety Planning and Suicide Prevention discussed: Yes,  SPE completed with Cheri Guppy, girlfriend.     Has patient been referred to the Quitline?: Patient refused referral  Patient has been referred for addiction treatment: Yes  Glenis Smoker, LCSW 01/24/2021, 9:54 AM

## 2021-01-24 NOTE — Progress Notes (Addendum)
Patient ID: Nathan Koch, male   DOB: 1990/12/03, 30 y.o.   MRN: [id number] RN reviewed discharge paperwork (AVS, BH Transition Record and Suicide Risk Assessment with patient. Explained all appointments and reviewed medication and patient verbalized understadning. Patient had all belongings at discharge which includes sample prescriptions and and 5 bags brought in from home. Discharged via safe transport to [facility].

## 2021-01-24 NOTE — Plan of Care (Signed)
  Problem: Education: Goal: Knowledge of [facility] Education information/materials will improve Outcome: Progressing Goal: Emotional status will improve Outcome: Progressing Goal: Mental status will improve Outcome: Progressing Goal: Verbalization of understanding the information provided will improve Outcome: Progressing   Problem: Activity: Goal: Interest or engagement in activities will improve Outcome: Progressing Goal: Sleeping patterns will improve Outcome: Progressing   Problem: Coping: Goal: Ability to verbalize frustrations and anger appropriately will improve Outcome: Progressing Goal: Ability to demonstrate self-control will improve Outcome: Progressing   Problem: Health Behavior/Discharge Planning: Goal: Identification of resources available to assist in meeting health care needs will improve Outcome: Progressing   Problem: Health Behavior/Discharge Planning: Goal: Identification of resources available to assist in meeting health care needs will improve Outcome: Progressing Goal: Compliance with treatment plan for underlying cause of condition will improve Outcome: Progressing   Problem: Health Behavior/Discharge Planning: Goal: Identification of resources available to assist in meeting health care needs will improve Outcome: Progressing   Problem: Physical Regulation: Goal: Ability to maintain clinical measurements within normal limits will improve Outcome: Progressing

## 2021-01-24 NOTE — Progress Notes (Signed)
Recreation Therapy Notes   Date: 01/24/2021  Time: 10:00 am   Location: Craft room   Behavioral response: N/A   Intervention Topic: Anger Management    Discussion/Intervention: Patient did not attend group.   Clinical Observations/Feedback:  Patient did not attend group.   [name] LRT/CTRS        [name] 01/24/2021 11:56 AM

## 2021-01-24 NOTE — Progress Notes (Signed)
Recreation Therapy Notes  INPATIENT RECREATION TR PLAN  Patient Details Name: Nathan Koch MRN: [id number] DOB: 15-Jun-1990 Today's Date: 01/24/2021  Rec Therapy Plan Is patient appropriate for Therapeutic Recreation?: Yes Treatment times per week: at least 3 Estimated Length of Stay: 5-7 days TR Treatment/Interventions: Group participation (Comment)  Discharge Criteria Pt will be discharged from therapy if:: Discharged Treatment plan/goals/alternatives discussed and agreed upon by:: Patient/family  Discharge Summary Short term goals set: Patient will identify 3 positive coping skills strategies to use post d/c within 5 recreation therapy group sessions Short term goals met: Not met Progress toward goals comments: Groups attended Which groups?: Goal setting Reason goals not met: Patient spent most of his time in his room Therapeutic equipment acquired: N/A Reason patient discharged from therapy: Discharge from hospital Pt/family agrees with progress & goals achieved: Yes Date patient discharged from therapy: 01/24/21   Bee Marchiano 01/24/2021, 12:30 PM

## 2021-01-24 NOTE — Progress Notes (Signed)
Patient is pleasant and cooperative.  He is looking forward to discharging and reports having some anxiety related to his leave.  He denies si/hi/avh.  He is med compliant and receives his meds without incident. He is active on the unit and gets along well with others on the unit.  Will continue to monitor with Q15 minute safety rounds.     Cleo Butler-Nicholson, LPN

## 2021-01-24 NOTE — Plan of Care (Signed)
  Problem: Coping Skills Goal: STG - Patient will identify 3 positive coping skills strategies to use post d/c within 5 recreation therapy group sessions Description: STG - Patient will identify 3 positive coping skills strategies to use post d/c within 5 recreation therapy group sessions 01/24/2021 1228 by Ernest Haber, LRT Outcome: Not Applicable [id number] by Ernest Haber, LRT Outcome: Not Met (add Reason) Note: Patient spent most of his time in his room.

## 2021-01-24 NOTE — [facility] Suicide Risk Assessment (Signed)
[facility] Discharge Suicide Risk Assessment   Principal Problem: Severe recurrent major depression without psychotic features [facility]) Discharge Diagnoses: Principal Problem:   Severe recurrent major depression without psychotic features ([name]) Active Problems:   Methamphetamine use disorder, moderate ([name])   Suicidal ideation   Self-inflicted laceration of left wrist ([name])   Total Time spent with patient: 35 minutes- 25 minutes face-to-face contact with patient, 10 minutes documentation, coordination of care, scripts   Musculoskeletal: Strength & Muscle Tone: within normal limits Gait & Station: normal Patient leans: N/A  Psychiatric Specialty Exam  Presentation  General Appearance: Appropriate for Environment; Casual  Eye Contact:Good  Speech:Clear and Coherent; Normal Rate  Speech Volume:Normal  Handedness:Right   Mood and Affect  Mood:Euthymic  Duration of Depression Symptoms: Greater than two weeks  Affect:Congruent   Thought Process  Thought Processes:Coherent; Goal Directed  Descriptions of Associations:Intact  Orientation:Full (Time, Place and Person)  Thought Content:Logical  History of Schizophrenia/Schizoaffective disorder:No  Duration of Psychotic Symptoms:N/A  Hallucinations:Hallucinations: None  Ideas of Reference:None  Suicidal Thoughts:Suicidal Thoughts: No  Homicidal Thoughts:Homicidal Thoughts: No   Sensorium  Memory:Immediate Good; Recent Good; Remote Good  Judgment:Intact  Insight:Present   Executive Functions  Concentration:Fair  Attention Span:Fair  Recall:Fair  Fund of Knowledge:Fair  Language:Fair   Psychomotor Activity  Psychomotor Activity:Psychomotor Activity: Normal   Assets  Assets:Communication Skills; Desire for Improvement; Housing; Physical Health; Resilience; Social Support; Talents/Skills   Sleep  Sleep:Sleep: Good Number of Hours of Sleep: 7.5   Physical Exam: Physical Exam ROS Blood pressure  114/63, pulse 78, temperature 97.8 F (36.6 C), temperature source Oral, resp. rate 18, height 5\' 9"  (1.753 m), weight 94.3 kg, SpO2 99 %. Body mass index is 30.72 kg/m.  Mental Status Per Nursing Assessment::   On Admission:  Suicidal ideation indicated by patient  Demographic Factors:  Male and Unemployed  Loss Factors: NA  Historical Factors: Prior suicide attempts  Risk Reduction Factors:   Sense of responsibility to family, Positive social support, Positive therapeutic relationship, and Positive coping skills or problem solving skills  Continued Clinical Symptoms:  Depression:   Recent sense of peace/wellbeing Alcohol/Substance Abuse/Dependencies Previous Psychiatric Diagnoses and Treatments  Cognitive Features That Contribute To Risk:  None    Suicide Risk:  Minimal: No identifiable suicidal ideation.  Patients presenting with no risk factors but with morbid ruminations; may be classified as minimal risk based on the severity of the depressive symptoms   Follow-up Information     [facility] Follow up.   Why: Here is an additional resource for outpatient mental health therapy. If needed, Please attend walk-in hours to scheduled your appointment for outpatient mental health services. Walk-in hours are Monday-Friday, 8am-3pm. Contact information: [address]  [address] [place] [phone]         Services, [facility] Follow up.   Specialty: [facility] information: [address] [place] [phone]                 Plan Of Care/Follow-up recommendations:  Activity:  as tolerated Diet:  regular diet  [phone], MD 01/24/2021, 9:22 AM

## 2021-02-04 ENCOUNTER — Emergency Department (HOSPITAL_COMMUNITY)
Admission: EM | Admit: 2021-02-04 | Discharge: 2021-02-04 | Disposition: A | Discharge status: Home / Self Care | Payer: Self-pay | Attending: Emergency Medicine | Admitting: Emergency Medicine

## 2021-02-04 ENCOUNTER — Other Ambulatory Visit: Payer: Self-pay

## 2021-02-04 ENCOUNTER — Encounter (HOSPITAL_COMMUNITY): Payer: Self-pay | Admitting: Emergency Medicine

## 2021-02-04 DIAGNOSIS — R112 Nausea with vomiting, unspecified: Secondary | ICD-10-CM | POA: Insufficient documentation

## 2021-02-04 DIAGNOSIS — F17210 Nicotine dependence, cigarettes, uncomplicated: Secondary | ICD-10-CM | POA: Insufficient documentation

## 2021-02-04 DIAGNOSIS — Z20822 Contact with and (suspected) exposure to covid-19: Secondary | ICD-10-CM | POA: Insufficient documentation

## 2021-02-04 DIAGNOSIS — J069 Acute upper respiratory infection, unspecified: Secondary | ICD-10-CM | POA: Insufficient documentation

## 2021-02-04 DIAGNOSIS — J45909 Unspecified asthma, uncomplicated: Secondary | ICD-10-CM | POA: Insufficient documentation

## 2021-02-04 LAB — RESP PANEL BY RT-PCR (FLU A&B, COVID) ARPGX2
Influenza A by PCR: NEGATIVE
Influenza B by PCR: NEGATIVE
SARS Coronavirus 2 by RT PCR: NEGATIVE

## 2021-02-04 MED ORDER — ALBUTEROL SULFATE HFA 108 (90 BASE) MCG/ACT IN AERS
2.0000 | INHALATION_SPRAY | RESPIRATORY_TRACT | Status: DC | PRN
  Administered 2021-02-04: 2 via RESPIRATORY_TRACT
  Filled 2021-02-04: qty 6.7

## 2021-02-04 NOTE — ED Triage Notes (Signed)
Patient reports generalized body aches with fatigue and back pain for several days .

## 2021-02-04 NOTE — Discharge Instructions (Addendum)
Recommend Tylenol and/or ibuprofen for any chills and aches.   The result of your COVID test will be available in MyChart in 2-4 hours.

## 2021-02-04 NOTE — ED Provider Notes (Signed)
MSE was initiated and I personally evaluated the patient and placed orders (if any) at  1:47 AM on February 04, 2021.  Symptoms of cough, chest tightness, aches, chills, nausea w/o vomiting x 3 days. No known COVID contacts.   Today's Vitals   02/04/21 0137  BP: 105/65  Pulse: 61  Resp: 16  Temp: 97.6 F (36.4 C)  TempSrc: Oral  SpO2: 100%   There is no height or weight on file to calculate BMI.  Appears in NAD, well appearing Lungs clear RRR No hypoxia   The patient appears stable so that the remainder of the MSE may be completed by another provider.   Elpidio Anis, PA-C 02/04/21 0148    Shon Baton, MD 02/04/21 [phone]

## 2021-02-04 NOTE — ED Provider Notes (Signed)
[facility] EMERGENCY DEPARTMENT Provider Note   CSN: [id number] Arrival date & time: 02/04/21  0000     History No chief complaint on file.   Nathan Koch is a 30 y.o. male.  Symptoms of cough, chest tightness, aches, chills, nausea w/o vomiting x 3 days. No known COVID contacts  The history is provided by the patient. No language interpreter was used.      Past Medical History:  Diagnosis Date   Anxiety    Anxiety disorder    Asthma    Bipolar 1 disorder ([name])    Depression    Methamphetamine abuse ([name])    PTSD (post-traumatic stress disorder)     Patient Active Problem List   Diagnosis Date Noted   Severe recurrent major depression without psychotic features ([name]) 01/16/2021   Bipolar 1 disorder, depressed ([name])    MDD (major depressive disorder), recurrent episode, severe ([name]) 09/11/2020   MDD (major depressive disorder), recurrent severe, without psychosis ([name]) 09/10/2020   Depression, major, recurrent, 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   Methamphetamine use disorder, moderate ([name]) 09/14/2019   MDD (major depressive disorder), recurrent, severe, with psychosis ([name]) 09/14/2019   MDD (major depressive disorder), single episode, severe with psychosis ([name]) 09/14/2019   Tobacco use disorder 02/21/2019   PTSD (post-traumatic stress disorder) 02/21/2019   Benzodiazepine abuse ([name]) 11/15/2017   Asthma 06/26/2014    Past Surgical History:  Procedure Laterality Date   CLOSED REDUCTION MANDIBLE N/A 07/02/2018   Procedure: CLOSED REDUCTION MANDIBULAR;  Surgeon: Vernie Murders, MD;  Location: [facility] ORS;  Service: ENT;  Laterality: N/A;       Family History  Problem Relation Age of Onset   Asthma Mother    Cancer Mother        breast cancer   Ulcers Mother    Cancer Father        esophogeal cancer   Hypertension Father    Asthma Brother     Social History   Tobacco Use   Smoking status: Every Day    Packs/day: 0.50    Years:  8.00    Pack years: 4.00    Types: Cigarettes   Smokeless tobacco: Never  Vaping Use   Vaping Use: Every day   Substances: Nicotine, Flavoring  Substance Use Topics   Alcohol use: Not Currently    Alcohol/week: 13.0 standard drinks    Types: 6 Cans of beer, 7 Standard drinks or equivalent per week    Comment: previously heavy drinker 2016. most recent 4 beer/ day drinker and none since 01-11-2020   Drug use: Yes    Types: Methamphetamines    Comment: states he uses 1 g every other day    Home Medications Prior to Admission medications   Medication Sig Start Date End Date Taking? Authorizing Provider  albuterol (VENTOLIN HFA) 108 (90 Base) MCG/ACT inhaler Inhale 2 puffs into the lungs every 4 (four) hours as needed for wheezing or shortness of breath. 01/23/21   Jesse Sans, MD  hydrOXYzine (ATARAX/VISTARIL) 25 MG tablet Take 1 tablet (25 mg total) by mouth 3 (three) times daily as needed for anxiety. 01/23/21   Jesse Sans, MD  QUEtiapine (SEROQUEL) 50 MG tablet Take 3 tablets (150 mg total) by mouth at bedtime. 01/23/21   Jesse Sans, MD  sertraline (ZOLOFT) 50 MG tablet Take 1 tablet (50 mg total) by mouth at bedtime. 01/23/21   Jesse Sans, MD  traZODone (DESYREL) 50 MG tablet Take  0.5 tablets (25 mg total) by mouth at bedtime as needed (may give at least 1 hour after bedtime Seroquel dose given if needed for sleep). 01/23/21   Jesse Sans, MD    Allergies    Patient has no known allergies.  Review of Systems   Review of Systems  Constitutional:  Positive for chills and fatigue. Negative for fever.  HENT: Negative.    Respiratory:  Positive for cough and chest tightness.   Cardiovascular: Negative.   Gastrointestinal:  Positive for nausea. Negative for vomiting.  Musculoskeletal:  Positive for myalgias.  Skin: Negative.   Neurological: Negative.    Physical Exam Updated Vital Signs BP 105/65   Pulse 61   Temp 97.6 F (36.4 C) (Oral)   Resp 16    SpO2 100%   Physical Exam Vitals and nursing note reviewed.  Constitutional:      Appearance: He is well-developed.  HENT:     Head: Normocephalic.     Mouth/Throat:     Mouth: Mucous membranes are moist.  Cardiovascular:     Rate and Rhythm: Normal rate and regular rhythm.     Heart sounds: No murmur heard. Pulmonary:     Effort: Pulmonary effort is normal.     Breath sounds: Normal breath sounds. No wheezing, rhonchi or rales.  Abdominal:     General: Bowel sounds are normal.     Palpations: Abdomen is soft.     Tenderness: There is no abdominal tenderness. There is no guarding or rebound.  Musculoskeletal:        General: Normal range of motion.     Cervical back: Normal range of motion and neck supple.  Skin:    General: Skin is warm and dry.  Neurological:     General: No focal deficit present.     Mental Status: He is alert and oriented to person, place, and time.    ED Results / Procedures / Treatments   Labs (all labs ordered are listed, but only abnormal results are displayed) Labs Reviewed  RESP PANEL BY RT-PCR (FLU A&B, COVID) [id number]    EKG None  Radiology No results found.  Procedures Procedures   Medications Ordered in ED Medications - No data to display  ED Course  I have reviewed the triage vital signs and the nursing notes.  Pertinent labs & imaging results that were available during my care of the patient were reviewed by me and considered in my medical decision making (see chart for details).    MDM Rules/Calculators/A&P                           Patient to ED with ss/sxs as per HPI.  Well appearing. No hypoxia, SOB. Lungs clear.   COVID/flu pending. Patient can be discharged home. MyChart access discussed.   Final Clinical Impression(s) / ED Diagnoses Final diagnoses:  None   URI  Rx / DC Orders ED Discharge Orders     None        Elpidio Anis, PA-C 02/04/21 0152    Shon Baton, MD 02/04/21 [phone]

## 2021-02-05 ENCOUNTER — Ambulatory Visit (HOSPITAL_COMMUNITY)
Admission: EM | Admit: 2021-02-05 | Discharge: 2021-02-05 | Disposition: A | Discharge status: Home / Self Care | Payer: No Payment, Other | Attending: Family | Admitting: Family

## 2021-02-05 DIAGNOSIS — F319 Bipolar disorder, unspecified: Secondary | ICD-10-CM | POA: Diagnosis not present

## 2021-02-05 DIAGNOSIS — F17210 Nicotine dependence, cigarettes, uncomplicated: Secondary | ICD-10-CM | POA: Diagnosis not present

## 2021-02-05 DIAGNOSIS — F332 Major depressive disorder, recurrent severe without psychotic features: Secondary | ICD-10-CM

## 2021-02-05 DIAGNOSIS — F4310 Post-traumatic stress disorder, unspecified: Secondary | ICD-10-CM | POA: Insufficient documentation

## 2021-02-05 DIAGNOSIS — R45851 Suicidal ideations: Secondary | ICD-10-CM | POA: Diagnosis not present

## 2021-02-05 DIAGNOSIS — Z20822 Contact with and (suspected) exposure to covid-19: Secondary | ICD-10-CM | POA: Insufficient documentation

## 2021-02-05 LAB — POCT URINE DRUG SCREEN - MANUAL ENTRY (I-SCREEN)
POC Amphetamine UR: NOT DETECTED
POC Buprenorphine (BUP): NOT DETECTED
POC Cocaine UR: NOT DETECTED
POC Marijuana UR: NOT DETECTED
POC Methadone UR: NOT DETECTED
POC Methamphetamine UR: NOT DETECTED
POC Morphine: NOT DETECTED
POC Oxazepam (BZO): NOT DETECTED
POC Oxycodone UR: NOT DETECTED
POC Secobarbital (BAR): NOT DETECTED

## 2021-02-05 LAB — CBC WITH DIFFERENTIAL/PLATELET
Abs Immature Granulocytes: 0.03 10*3/uL (ref 0.00–0.07)
Basophils Absolute: 0 10*3/uL (ref 0.0–0.1)
Basophils Relative: 0 %
Eosinophils Absolute: 0.5 10*3/uL (ref 0.0–0.5)
Eosinophils Relative: 5 %
HCT: 43.1 % (ref 39.0–52.0)
Hemoglobin: 14.4 g/dL (ref 13.0–17.0)
Immature Granulocytes: 0 %
Lymphocytes Relative: 29 %
Lymphs Abs: 2.7 10*3/uL (ref 0.7–4.0)
MCH: 28.5 pg (ref 26.0–34.0)
MCHC: 33.4 g/dL (ref 30.0–36.0)
MCV: 85.3 fL (ref 80.0–100.0)
Monocytes Absolute: 0.7 10*3/uL (ref 0.1–1.0)
Monocytes Relative: 7 %
Neutro Abs: 5.3 10*3/uL (ref 1.7–7.7)
Neutrophils Relative %: 59 %
Platelets: 346 10*3/uL (ref 150–400)
RBC: 5.05 MIL/uL (ref 4.22–5.81)
RDW: 14 % (ref 11.5–15.5)
WBC: 9.2 10*3/uL (ref 4.0–10.5)
nRBC: 0 % (ref 0.0–0.2)

## 2021-02-05 LAB — COMPREHENSIVE METABOLIC PANEL
ALT: 16 U/L (ref 0–44)
AST: 21 U/L (ref 15–41)
Albumin: 4.3 g/dL (ref 3.5–5.0)
Alkaline Phosphatase: 76 U/L (ref 38–126)
Anion gap: 11 (ref 5–15)
BUN: 15 mg/dL (ref 6–20)
CO2: 25 mmol/L (ref 22–32)
Calcium: 9.8 mg/dL (ref 8.9–10.3)
Chloride: 102 mmol/L (ref 98–111)
Creatinine, Ser: 0.76 mg/dL (ref 0.61–1.24)
GFR, Estimated: >60 mL/min (ref >60)
Glucose, Bld: 88 mg/dL (ref 70–99)
Potassium: 4 mmol/L (ref 3.5–5.1)
Sodium: 138 mmol/L (ref 135–145)
Total Bilirubin: 1 mg/dL (ref 0.3–1.2)
Total Protein: 7.5 g/dL (ref 6.5–8.1)

## 2021-02-05 LAB — T4, FREE: Free T4: 0.8 ng/dL (ref 0.61–1.12)

## 2021-02-05 LAB — RESP PANEL BY RT-PCR (FLU A&B, COVID) ARPGX2
Influenza A by PCR: NEGATIVE
Influenza B by PCR: NEGATIVE
SARS Coronavirus 2 by RT PCR: NEGATIVE

## 2021-02-05 LAB — ETHANOL: Alcohol, Ethyl (B): <10 mg/dL (ref <10)

## 2021-02-05 LAB — POC SARS CORONAVIRUS 2 AG: SARSCOV2ONAVIRUS 2 AG: NEGATIVE

## 2021-02-05 LAB — POC SARS CORONAVIRUS 2 AG -  ED: SARS Coronavirus 2 Ag: NEGATIVE

## 2021-02-05 LAB — TSH: TSH: 5.75 u[IU]/mL — ABNORMAL HIGH (ref 0.350–4.500)

## 2021-02-05 MED ORDER — HYDROXYZINE HCL 25 MG PO TABS
25.0000 mg | ORAL_TABLET | Freq: Three times a day (TID) | ORAL | Status: DC | PRN
Start: 2021-02-05 — End: 2021-02-05

## 2021-02-05 MED ORDER — ALUM & MAG HYDROXIDE-SIMETH 200-200-20 MG/5ML PO SUSP: 30.0000 mL | ORAL | Status: DC | PRN

## 2021-02-05 MED ORDER — SERTRALINE HCL 50 MG PO TABS: 50.0000 mg | ORAL_TABLET | Freq: Every day | ORAL | 0 refills | Status: DC

## 2021-02-05 MED ORDER — QUETIAPINE FUMARATE 50 MG PO TABS
50.0000 mg | ORAL_TABLET | Freq: Every day | ORAL | Status: DC
  Filled 2021-02-05: qty 7

## 2021-02-05 MED ORDER — QUETIAPINE FUMARATE 50 MG PO TABS: 150.0000 mg | ORAL_TABLET | Freq: Every day | ORAL | 0 refills | Status: DC

## 2021-02-05 MED ORDER — SERTRALINE HCL 50 MG PO TABS
50.0000 mg | ORAL_TABLET | Freq: Every day | ORAL | Status: DC
  Filled 2021-02-05: qty 7

## 2021-02-05 MED ORDER — TRAZODONE HCL 50 MG PO TABS: 50.0000 mg | ORAL_TABLET | Freq: Every evening | ORAL | Status: DC | PRN

## 2021-02-05 MED ORDER — ALBUTEROL SULFATE HFA 108 (90 BASE) MCG/ACT IN AERS: 2.0000 | INHALATION_SPRAY | RESPIRATORY_TRACT | Status: DC | PRN

## 2021-02-05 MED ORDER — MAGNESIUM HYDROXIDE 400 MG/5ML PO SUSP: 30.0000 mL | Freq: Every day | ORAL | Status: DC | PRN

## 2021-02-05 MED ORDER — SERTRALINE HCL 50 MG PO TABS: 50.0000 mg | ORAL_TABLET | Freq: Every day | ORAL | 2 refills | Status: DC

## 2021-02-05 MED ORDER — ACETAMINOPHEN 325 MG PO TABS
650.0000 mg | ORAL_TABLET | Freq: Four times a day (QID) | ORAL | Status: DC | PRN
Start: 2021-02-05 — End: 2021-02-05

## 2021-02-05 MED ORDER — QUETIAPINE FUMARATE 50 MG PO TABS: 50.0000 mg | ORAL_TABLET | Freq: Every day | ORAL | 0 refills | Status: DC

## 2021-02-05 NOTE — ED Provider Notes (Signed)
FBC/OBS ASAP Discharge Summary  Date and Time: 02/05/2021 1:25 PM  Name: Nathan Koch  MRN:  [id number]   Discharge Diagnoses:  Final diagnoses:  Severe episode of recurrent major depressive disorder, without psychotic features ([name])  Suicidal ideation    Subjective: [name] was seen and evaluated face-to-face.  He is denying suicidal or homicidal ideations.  He reports he had a chance to speak with the [place] house owner.  States he will be able to be moved to a different house on Friday.  Patient is requesting medication samples due to financial concerns.  Denies auditory or visual hallucinations.  Patient to discharge and follow-up with outpatient resources.  Stay Summary: per admission assessment note: "Nathan Koch is a 30 year old is a 30 year old male with documented past psychiatric history significant for MDD, bipolar 1 disorder, PTSD, tobacco use disorder, benzodiazepine abuse, anxiety, and methamphetamine use disorder, as well as documented past medical history of asthma, who presents to the [facility] behavioral health urgent care [facility]) as a voluntary walk-in via [employer] examiner.  Patient states that he called the police prior to him being brought to the [facility] due to SI.  When patient is asked why he called the police, patient states "my mental health is all jacked up at the moment. I've been having bad thoughts and when I started having bad thoughts, I usually try to act on them".  Patient states that he has been living at the [place] house in [place] for the past 22 days.  He reports that recently, the house manager/treasurer of [place] house approached him and asked him to acquire drugs for him.  Patient reports that when he refused this request, the house manager became angry and has now been mistreating him and threatening to get him kicked out of the [place] house."   Total Time spent with patient: 15 minutes  Past Psychiatric History:  Past Medical History:  Past Medical  History:  Diagnosis Date   Anxiety    Anxiety disorder    Asthma    Bipolar 1 disorder ([name])    Depression    Methamphetamine abuse ([name])    PTSD (post-traumatic stress disorder)     Past Surgical History:  Procedure Laterality Date   CLOSED REDUCTION MANDIBLE N/A 07/02/2018   Procedure: CLOSED REDUCTION MANDIBULAR;  Surgeon: Vernie Murders, MD;  Location: [facility] ORS;  Service: ENT;  Laterality: N/A;   Family History:  Family History  Problem Relation Age of Onset   Asthma Mother    Cancer Mother        breast cancer   Ulcers Mother    Cancer Father        esophogeal cancer   Hypertension Father    Asthma Brother    Family Psychiatric History:  Social History:  Social History   Substance and Sexual Activity  Alcohol Use Not Currently   Alcohol/week: 13.0 standard drinks   Types: 6 Cans of beer, 7 Standard drinks or equivalent per week   Comment: previously heavy drinker 2016. most recent 4 beer/ day drinker and none since 01-11-2020     Social History   Substance and Sexual Activity  Drug Use Yes   Types: Methamphetamines   Comment: states he uses 1 g every other day    Social History   Socioeconomic History   Marital status: Single    Spouse name: Not on file   Number of children: 1   Years of education: 10   Highest education level: 10th grade  Occupational History   Not on file  Tobacco Use   Smoking status: Every Day    Packs/day: 0.50    Years: 8.00    Pack years: 4.00    Types: Cigarettes   Smokeless tobacco: Never  Vaping Use   Vaping Use: Every day   Substances: Nicotine, Flavoring  Substance and Sexual Activity   Alcohol use: Not Currently    Alcohol/week: 13.0 standard drinks    Types: 6 Cans of beer, 7 Standard drinks or equivalent per week    Comment: previously heavy drinker 2016. most recent 4 beer/ day drinker and none since 01-11-2020   Drug use: Yes    Types: Methamphetamines    Comment: states he uses 1 g every other day   Sexual  activity: Not on file  Other Topics Concern   Not on file  Social History Narrative   Not on file   Social Determinants of Health   Financial Resource Strain: Not on file  Food Insecurity: Not on file  Transportation Needs: Not on file  Physical Activity: Not on file  Stress: Not on file  Social Connections: Not on file   SDOH:  SDOH Screenings   Alcohol Screen: Low Risk    Last Alcohol Screening Score (AUDIT): 1  Depression (PHQ2-9): Medium Risk   PHQ-2 Score: 9  Financial Resource Strain: Not on file  Food Insecurity: Not on file  Housing: Not on file  Physical Activity: Not on file  Social Connections: Not on file  Stress: Not on file  Tobacco Use: High Risk   Smoking Tobacco Use: Every Day   Smokeless Tobacco Use: Never  Transportation Needs: Not on file    Tobacco Cessation:  N/A, patient does not currently use tobacco products  Current Medications:  Current Facility-Administered Medications  Medication Dose Route Frequency Provider Last Rate Last Admin   acetaminophen (TYLENOL) tablet 650 mg  650 mg Oral Q6H PRN Ladona Ridgel, Cody W, PA-C       albuterol (VENTOLIN HFA) 108 (90 Base) MCG/ACT inhaler 2 puff  2 puff Inhalation Q4H PRN Melbourne Abts W, PA-C       alum & mag hydroxide-simeth (MAALOX/MYLANTA) 200-200-20 MG/5ML suspension 30 mL  30 mL Oral Q4H PRN Melbourne Abts W, PA-C       hydrOXYzine (ATARAX/VISTARIL) tablet 25 mg  25 mg Oral TID PRN Melbourne Abts W, PA-C       magnesium hydroxide (MILK OF MAGNESIA) suspension 30 mL  30 mL Oral Daily PRN Ladona Ridgel, Cody W, PA-C       QUEtiapine (SEROQUEL) tablet 50 mg  50 mg Oral QHS Ladona Ridgel, Cody W, PA-C       sertraline (ZOLOFT) tablet 50 mg  50 mg Oral QHS Ladona Ridgel, Cody W, PA-C       traZODone (DESYREL) tablet 50 mg  50 mg Oral QHS PRN Jaclyn Shaggy, PA-C       Current Outpatient Medications  Medication Sig Dispense Refill   albuterol (VENTOLIN HFA) 108 (90 Base) MCG/ACT inhaler Inhale 2 puffs into the lungs every 4 (four)  hours as needed for wheezing or shortness of breath. 6.7 g 1   QUEtiapine (SEROQUEL) 50 MG tablet Take 1 tablet (50 mg total) by mouth at bedtime. 30 tablet 0   sertraline (ZOLOFT) 50 MG tablet Take 1 tablet (50 mg total) by mouth at bedtime. 7 tablet 0    PTA Medications: (Not in a hospital admission)   Musculoskeletal  Strength & Muscle Tone: within normal  limits Gait & Station: normal Patient leans: N/A  Psychiatric Specialty Exam  Presentation  General Appearance: Appropriate for Environment; Well Groomed  Eye Contact:Good  Speech:Clear and Coherent; Normal Rate  Speech Volume:Normal  Handedness:Right   Mood and Affect  Mood:Depressed  Affect:Congruent   Thought Process  Thought Processes:Coherent; Goal Directed; Linear  Descriptions of Associations:Intact  Orientation:Full (Time, Place and Person)  Thought Content:Logical; WDL  Diagnosis of Schizophrenia or Schizoaffective disorder in past: No    Hallucinations:Hallucinations: None  Ideas of Reference:None  Suicidal Thoughts:Suicidal Thoughts: Yes, Active SI Active Intent and/or Plan: With Intent; With Plan; With Means to Carry Out; With Access to Means  Homicidal Thoughts:Homicidal Thoughts: No   Sensorium  Memory:Immediate Good; Recent Good; Remote Good  Judgment:Fair  Insight:Present   Executive Functions  Concentration:Good  Attention Span:Good  Recall:Good  Fund of Knowledge:Good  Language:Good   Psychomotor Activity  Psychomotor Activity:Psychomotor Activity: Normal   Assets  Assets:Communication Skills; Desire for Improvement; Housing; Leisure Time; Physical Health; Resilience; Social Support; Vocational/Educational; Transportation   Sleep  Sleep:Sleep: Poor Number of Hours of Sleep: 2   Nutritional Assessment (For OBS and FBC admissions only) Has the patient had a weight loss or gain of 10 pounds or more in the last 3 months?: No Has the patient had a decrease in food  intake/or appetite?: No Does the patient have dental problems?: No Does the patient have eating habits or behaviors that may be indicators of an eating disorder including binging or inducing vomiting?: No Has the patient recently lost weight without trying?: 0 Has the patient been eating poorly because of a decreased appetite?: 0 Malnutrition Screening Tool Score: 0   Physical Exam  Physical Exam Vitals reviewed.  Cardiovascular:     Rate and Rhythm: Normal rate and regular rhythm.  Pulmonary:     Effort: Pulmonary effort is normal.  Neurological:     Mental Status: He is alert and oriented to person, place, and time.  Psychiatric:        Attention and Perception: Attention normal.        Mood and Affect: Mood normal.        Speech: Speech normal.        Behavior: Behavior normal.        Thought Content: Thought content normal. Thought content does not include suicidal ideation.        Cognition and Memory: Cognition and memory normal.        Judgment: Judgment normal.   Review of Systems  Eyes: Negative.   Cardiovascular: Negative.   Musculoskeletal: Negative.   Skin: Negative.   Psychiatric/Behavioral:  Positive for depression. Negative for suicidal ideas. The patient is nervous/anxious.   All other systems reviewed and are negative. Blood pressure 111/60, pulse 68, temperature 98.5 F (36.9 C), temperature source Oral, resp. rate 16, SpO2 98 %. There is no height or weight on file to calculate BMI.  Demographic Factors:  Male and Low socioeconomic status  Loss Factors: Loss of significant relationship and Financial problems/change in socioeconomic status  Historical Factors: Family history of mental illness or substance abuse  Risk Reduction Factors:   Positive social support, Positive therapeutic relationship, and Positive coping skills or problem solving skills  Continued Clinical Symptoms:  Severe Anxiety and/or Agitation Depression:   Comorbid alcohol  abuse/dependence Hopelessness  Cognitive Features That Contribute To Risk:  Closed-mindedness    Suicide Risk:  Minimal: No identifiable suicidal ideation.  Patients presenting with no risk factors but with morbid  ruminations; may be classified as minimal risk based on the severity of the depressive symptoms  Plan Of Care/Follow-up recommendations:  Activity:  as tolerated Diet:  heart healthy  Disposition: Take all medications as prescribed. Keep all follow-up appointments as scheduled.  Do not consume alcohol or use illegal drugs while on prescription medications. Report any adverse effects from your medications to your primary care provider promptly.  In the event of recurrent symptoms or worsening symptoms, call 911, a crisis hotline, or go to the nearest emergency department for evaluation.    Oneta Rack, NP 02/05/2021, 1:25 PM

## 2021-02-05 NOTE — ED Provider Notes (Signed)
Behavioral Health Admission H&P [facility] & OBS)  Date: 02/05/21 Patient Name: Nathan Koch MRN: [id number] Chief Complaint:  Chief Complaint  Patient presents with   Suicidal      Diagnoses:  Final diagnoses:  Severe episode of recurrent major depressive disorder, without psychotic features ([name])  Suicidal ideation    HPI: Nathan Koch is a 30 year old is a 30 year old male with documented past psychiatric history significant for MDD, bipolar 1 disorder, PTSD, tobacco use disorder, benzodiazepine abuse, anxiety, and methamphetamine use disorder, as well as documented past medical history of asthma, who presents to the [facility] behavioral health urgent care [facility]) as a voluntary walk-in via [employer].  Patient states that he called the police prior to him being brought to the [facility] due to SI.  When patient is asked why he called the police, patient states "my mental health is all jacked up at the moment. I've been having bad thoughts and when I started having bad thoughts, I usually try to act on them".  Patient states that he has been living at the [place] house in [place] for the past 22 days.  He reports that recently, the house manager/treasurer of [facility] approached him and asked him to acquire drugs for him.  Patient reports that when he refused this request, the house manager became angry and has now been mistreating him and threatening to get him kicked out of the [facility] house.  Patient states that the stress from this incident/mistreatment by the house manager has led him to develop feelings of worsening depression and suicidal thoughts.  Patient states that last night on 02/04/2021 when he was at work, he developed active SI with intent and plan to attempt suicide by cutting himself with a knife that he had with him.  Patient states that he did not actually act on this SI or attempt to stab himself.  Patient endorses active SI currently on exam with a plan to cut his neck  with the same knife that he thought about cutting himself with earlier last night.  Patient reports that he attempted suicide 22 days ago by intentionally cutting his left arm with a knife (the same knife that he currently owns and has had thoughts about cutting himself again with as another suicide attempt).  Patient denies history of any additional suicide attempts.  Patient states that prior to that suicide attempt that occurred 22 days ago mentioned above, the last time he had cut himself was 2 months prior to medication.  He denies history of intentionally burning himself.  He denies homicidal ideation.  He denies auditory or visual hallucinations.  He denies paranoia or delusions.   Patient describes his sleep as poor, about 2 hours per night.  He endorses intermittent feelings of anhedonia and he endorses feelings of guilt, hopelessness, and worthlessness.  He describes his energy as "very poor".  He denies concentration changes, appetite changes, or weight changes. Per chart review, patient was recently psychiatrically hospitalized at [facility] from 01/16/2021 to 01/24/2021 and was discharged on Vistaril 25 mg p.o. 3 times daily as needed for anxiety, Seroquel 150 mg p.o. at bedtime, Zoloft 50 mg p.o. at bedtime, and trazodone 25 mg p.o. at bedtime as needed for sleep.  Patient states that he has not taken any of these medications since he was discharged from [facility] on 01/24/2021 because he cannot afford these medications.  Patient denies taking any psychotropic medications at this time.  He reports that since he was discharged from  [facility] on 01/24/2021, he has not followed up with an outpatient psychiatrist or therapist.  He denies being psychiatrically hospitalized again since the recent September 2022 [facility] admission noted above.  Patient reports that he has been sober from alcohol and all substances for 25 days.  He reports that prior to this 25-day period of sobriety, he was smoking/snorting methamphetamine daily  and was using about 3 g of methamphetamine per week.  Patient denies using alcohol or any other illicit substances prior to this period of sobriety.  Patient endorses vaping nicotine daily and smoking less than 0.5 packs/day of cigarettes daily.  Patient denies any alcohol or substance use at this time.  Patient denies history of seizures.  He reports that he is currently homeless but then states that he is still living at the [facility] house in [place].  He denies access to firearms or weapons.  Patient states that he is currently employed at [employer] and works third shift.  On exam, patient is sitting comfortably, in no acute distress.  His mood is depressed with congruent affect.  He is alert and oriented x4, cooperative, and answers all questions appropriately during the evaluation.  No indication that patient is responding to internal or external stimuli at this time.  Patient unable to contract for safety at this time.  PHQ 2-9:  Flowsheet Row ED from 02/05/2021 in [facility] ED from 01/14/2021 in [place] EMERGENCY DEPARTMENT ED from 11/10/2020 in [facility] EMERGENCY DEPARTMENT  Thoughts that you would be better off dead, or of hurting yourself in some way More than half the days More than half the days More than half the days  PHQ-9 Total Score Flowsheet Row ED from 02/05/2021 in [facility] ED from 02/04/2021 in [facility] EMERGENCY DEPARTMENT Admission (Discharged) from 01/16/2021 in [facility] INPATIENT BEHAVIORAL MEDICINE  C-SSRS RISK CATEGORY High Risk Error: Question 6 not populated Low Risk        Total Time spent with patient: 30 minutes  Musculoskeletal  Strength & Muscle Tone: within normal limits Gait & Station: normal Patient leans: N/A  Psychiatric Specialty Exam  Presentation General Appearance: Appropriate for Environment; Well Groomed  Eye Contact:Good  Speech:Clear and  Coherent; Normal Rate  Speech Volume:Normal  Handedness:Right   Mood and Affect  Mood:Depressed  Affect:Congruent   Thought Process  Thought Processes:Coherent; Goal Directed; Linear  Descriptions of Associations:Intact  Orientation:Full (Time, Place and Person)  Thought Content:Logical; WDL  Diagnosis of Schizophrenia or Schizoaffective disorder in past: No   Hallucinations:Hallucinations: None  Ideas of Reference:None  Suicidal Thoughts:Suicidal Thoughts: Yes, Active SI Active Intent and/or Plan: With Intent; With Plan; With Means to Carry Out; With Access to Means  Homicidal Thoughts:Homicidal Thoughts: No   Sensorium  Memory:Immediate Good; Recent Good; Remote Good  Judgment:Fair  Insight:Present   Executive Functions  Concentration:Good  Attention Span:Good  Recall:Good  Fund of Knowledge:Good  Language:Good   Psychomotor Activity  Psychomotor Activity:Psychomotor Activity: Normal   Assets  Assets:Communication Skills; Desire for Improvement; Housing; Leisure Time; Physical Health; Resilience; Social Support; Vocational/Educational; Transportation   Sleep  Sleep:Sleep: Poor Number of Hours of Sleep: 2   Nutritional Assessment (For OBS and [facility] admissions only) Has the patient had a weight loss or gain of 10 pounds or more in the last 3 months?: No Has the patient had a decrease in food intake/or appetite?: No Does the patient have dental  problems?: No Does the patient have eating habits or behaviors that may be indicators of an eating disorder including binging or inducing vomiting?: No Has the patient recently lost weight without trying?: 0 Has the patient been eating poorly because of a decreased appetite?: 0 Malnutrition Screening Tool Score: 0   Physical Exam Vitals reviewed.  Constitutional:      General: He is not in acute distress.    Appearance: Normal appearance. He is not ill-appearing, toxic-appearing or diaphoretic.   HENT:     Head: Normocephalic and atraumatic.     Right Ear: External ear normal.     Left Ear: External ear normal.     Nose: Nose normal.  Eyes:     General:        Right eye: No discharge.        Left eye: No discharge.     Conjunctiva/sclera: Conjunctivae normal.  Cardiovascular:     Rate and Rhythm: Normal rate.  Pulmonary:     Effort: Pulmonary effort is normal. No respiratory distress.  Musculoskeletal:        General: Normal range of motion.     Cervical back: Normal range of motion.  Skin:    Comments: Multiple well-healing appearing scarlike lesions noted on patient's left arm/forearm with no signs of apparent inflammation, drainage, or infection noted.  Neurological:     General: No focal deficit present.     Mental Status: He is alert and oriented to person, place, and time.     Comments: No tremor noted.   Psychiatric:        Attention and Perception: Attention and perception normal. He does not perceive auditory or visual hallucinations.        Mood and Affect: Mood is depressed.        Speech: Speech normal.        Behavior: Behavior normal. Behavior is not agitated, slowed, aggressive, withdrawn, hyperactive or combative. Behavior is cooperative.        Thought Content: Thought content is not paranoid or delusional. Thought content includes suicidal ideation. Thought content does not include homicidal ideation. Thought content includes suicidal plan.     Comments: Affect mood congruent.    Review of Systems  Constitutional:  Positive for malaise/fatigue. Negative for chills, diaphoresis, fever and weight loss.  HENT:  Negative for congestion.   Respiratory:  Negative for cough and shortness of breath.   Cardiovascular:  Negative for chest pain and palpitations.  Gastrointestinal:  Negative for abdominal pain, constipation, diarrhea, nausea and vomiting.  Musculoskeletal:  Negative for joint pain and myalgias.  Neurological:  Negative for dizziness, seizures and  headaches.  Psychiatric/Behavioral:  Positive for depression and suicidal ideas. Negative for hallucinations, memory loss and substance abuse. The patient has insomnia. The patient is not nervous/anxious.   All other systems reviewed and are negative.  Vitals: Blood pressure 111/60, pulse 68, temperature 98.5 F (36.9 C), temperature source Oral, resp. rate 16, SpO2 98 %. There is no height or weight on file to calculate BMI.  Past Psychiatric History: Past psychiatric history significant for MDD, bipolar 1 disorder, PTSD, tobacco use disorder, benzodiazepine abuse, anxiety, and methamphetamine use disorder.  Please see HPI for additional details regarding patient's past psychiatric history.  Is the patient at risk to self? Yes  Has the patient been a risk to self in the past 6 months? Yes .    Has the patient been a risk to self within the distant past? Yes  Is the patient a risk to others? No   Has the patient been a risk to others in the past 6 months? No   Has the patient been a risk to others within the distant past? No   Past Medical History:  Past Medical History:  Diagnosis Date   Anxiety    Anxiety disorder    Asthma    Bipolar 1 disorder ([name])    Depression    Methamphetamine abuse ([name])    PTSD (post-traumatic stress disorder)     Past Surgical History:  Procedure Laterality Date   CLOSED REDUCTION MANDIBLE N/A 07/02/2018   Procedure: CLOSED REDUCTION MANDIBULAR;  Surgeon: Vernie Murders, MD;  Location: [facility] ORS;  Service: ENT;  Laterality: N/A;    Family History:  Family History  Problem Relation Age of Onset   Asthma Mother    Cancer Mother        breast cancer   Ulcers Mother    Cancer Father        esophogeal cancer   Hypertension Father    Asthma Brother     Social History:  Social History   Socioeconomic History   Marital status: Single    Spouse name: Not on file   Number of children: 1   Years of education: 10   Highest education level: 10th  grade  Occupational History   Not on file  Tobacco Use   Smoking status: Every Day    Packs/day: 0.50    Years: 8.00    Pack years: 4.00    Types: Cigarettes   Smokeless tobacco: Never  Vaping Use   Vaping Use: Every day   Substances: Nicotine, Flavoring  Substance and Sexual Activity   Alcohol use: Not Currently    Alcohol/week: 13.0 standard drinks    Types: 6 Cans of beer, 7 Standard drinks or equivalent per week    Comment: previously heavy drinker 2016. most recent 4 beer/ day drinker and none since 01-11-2020   Drug use: Yes    Types: Methamphetamines    Comment: states he uses 1 g every other day   Sexual activity: Not on file  Other Topics Concern   Not on file  Social History Narrative   Not on file   Social Determinants of Health   Financial Resource Strain: Not on file  Food Insecurity: Not on file  Transportation Needs: Not on file  Physical Activity: Not on file  Stress: Not on file  Social Connections: Not on file  Intimate Partner Violence: Not on file    SDOH:  SDOH Screenings   Alcohol Screen: Low Risk    Last Alcohol Screening Score (AUDIT): 1  Depression (PHQ2-9): Medium Risk   PHQ-2 Score: 9  Financial Resource Strain: Not on file  Food Insecurity: Not on file  Housing: Not on file  Physical Activity: Not on file  Social Connections: Not on file  Stress: Not on file  Tobacco Use: High Risk   Smoking Tobacco Use: Every Day   Smokeless Tobacco Use: Never  Transportation Needs: Not on file    Last Labs:  Admission on 02/05/2021  Component Date Value Ref Range Status   SARS Coronavirus 2 by RT PCR 02/05/2021 NEGATIVE  NEGATIVE Final   Comment: (NOTE) SARS-CoV-2 target nucleic acids are NOT DETECTED.  The SARS-CoV-2 RNA is generally detectable in upper respiratory specimens during the acute phase of infection. The lowest concentration of SARS-CoV-2 viral copies this assay can detect is 138 copies/mL. A negative  result does not preclude  SARS-Cov-2 infection and should not be used as the sole basis for treatment or other patient management decisions. A negative result may occur with  improper specimen collection/handling, submission of specimen other than nasopharyngeal swab, presence of viral mutation(s) within the areas targeted by this assay, and inadequate number of viral copies(<138 copies/mL). A negative result must be combined with clinical observations, patient history, and epidemiological information. The expected result is Negative.  Fact Sheet for Patients:  BloggerCourse.com  Fact Sheet for Healthcare Providers:  SeriousBroker.it  This test is no                          t yet approved or cleared by the [place] FDA and  has been authorized for detection and/or diagnosis of SARS-CoV-2 by FDA under an Emergency Use Authorization (EUA). This EUA will remain  in effect (meaning this test can be used) for the duration of the COVID-19 declaration under Section 564(b)(1) of the Act, 21 U.S.C.section 360bbb-3(b)(1), unless the authorization is terminated  or revoked sooner.       Influenza A by PCR 02/05/2021 NEGATIVE  NEGATIVE Final   Influenza B by PCR 02/05/2021 NEGATIVE  NEGATIVE Final   Comment: (NOTE) The Xpert Xpress SARS-CoV-2/FLU/RSV plus assay is intended as an aid in the diagnosis of influenza from Nasopharyngeal swab specimens and should not be used as a sole basis for treatment. Nasal washings and aspirates are unacceptable for Xpert Xpress SARS-CoV-2/FLU/RSV testing.  Fact Sheet for Patients: BloggerCourse.com  Fact Sheet for Healthcare Providers: SeriousBroker.it  This test is not yet approved or cleared by the [place] FDA and has been authorized for detection and/or diagnosis of SARS-CoV-2 by FDA under an Emergency Use Authorization (EUA). This EUA will remain in effect  (meaning this test can be used) for the duration of the COVID-19 declaration under Section 564(b)(1) of the Act, 21 U.S.C. section 360bbb-3(b)(1), unless the authorization is terminated or revoked.  Performed at [facility], [address] [place], [place] [address]    SARS Coronavirus 2 Ag 02/05/2021 Negative  Negative Preliminary   WBC 02/05/2021 9.2  4.0 - 10.5 K/uL Final   RBC 02/05/2021 5.05  4.22 - 5.81 MIL/uL Final   Hemoglobin 02/05/2021 14.4  13.0 - 17.0 g/dL Final   HCT 52/77/8242 43.1  39.0 - 52.0 % Final   MCV 02/05/2021 85.3  80.0 - 100.0 fL Final   MCH 02/05/2021 28.5  26.0 - 34.0 pg Final   MCHC 02/05/2021 33.4  30.0 - 36.0 g/dL Final   RDW 35/36/1443 14.0  11.5 - 15.5 % Final   Platelets 02/05/2021 346  150 - 400 K/uL Final   nRBC 02/05/2021 0.0  0.0 - 0.2 % Final   Neutrophils Relative % 02/05/2021 59  % Final   Neutro Abs 02/05/2021 5.3  1.7 - 7.7 K/uL Final   Lymphocytes Relative 02/05/2021 29  % Final   Lymphs Abs 02/05/2021 2.7  0.7 - 4.0 K/uL Final   Monocytes Relative 02/05/2021 7  % Final   Monocytes Absolute 02/05/2021 0.7  0.1 - 1.0 K/uL Final   Eosinophils Relative 02/05/2021 5  % Final   Eosinophils Absolute 02/05/2021 0.5  0.0 - 0.5 K/uL Final   Basophils Relative 02/05/2021 0  % Final   Basophils Absolute 02/05/2021 0.0  0.0 - 0.1 K/uL Final   Immature Granulocytes 02/05/2021 0  % Final   Abs Immature Granulocytes 02/05/2021  0.03  0.00 - 0.07 K/uL Final   Performed at [facility], [address], [place], [place] [address]   Sodium 02/05/2021 138  135 - 145 mmol/L Final   Potassium 02/05/2021 4.0  3.5 - 5.1 mmol/L Final   Chloride 02/05/2021 102  98 - 111 mmol/L Final   CO2 02/05/2021 25  22 - 32 mmol/L Final   Glucose, Bld 02/05/2021 88  70 - 99 mg/dL Final   Glucose reference range applies only to samples taken after fasting for at least 8 hours.   BUN 02/05/2021 15  6 - 20 mg/dL Final   Creatinine, Ser 02/05/2021 0.76  0.61 - 1.24  mg/dL Final   Calcium 60/45/4098 9.8  8.9 - 10.3 mg/dL Final   Total Protein 11/91/4782 7.5  6.5 - 8.1 g/dL Final   Albumin 95/62/1308 4.3  3.5 - 5.0 g/dL Final   AST 65/78/4696 21  15 - 41 U/L Final   ALT 02/05/2021 16  0 - 44 U/L Final   Alkaline Phosphatase 02/05/2021 76  38 - 126 U/L Final   Total Bilirubin 02/05/2021 1.0  0.3 - 1.2 mg/dL Final   GFR, Estimated 02/05/2021 >60  >60 mL/min Final   Comment: (NOTE) Calculated using the CKD-EPI Creatinine Equation (2021)    Anion gap 02/05/2021 11  5 - 15 Final   Performed at [facility], [address], [place], [place] [address]   Alcohol, Ethyl (B) 02/05/2021 <10  <10 mg/dL Final   Comment: (NOTE) Lowest detectable limit for serum alcohol is 10 mg/dL.  For medical purposes only. Performed at [facility], [address], [place], [place] [address]    POC Amphetamine UR 02/05/2021 None Detected  NONE DETECTED (Cut Off Level 1000 ng/mL) Preliminary   POC Secobarbital (BAR) 02/05/2021 None Detected  NONE DETECTED (Cut Off Level 300 ng/mL) Preliminary   POC Buprenorphine (BUP) 02/05/2021 None Detected  NONE DETECTED (Cut Off Level 10 ng/mL) Preliminary   POC Oxazepam (BZO) 02/05/2021 None Detected  NONE DETECTED (Cut Off Level 300 ng/mL) Preliminary   POC Cocaine UR 02/05/2021 None Detected  NONE DETECTED (Cut Off Level 300 ng/mL) Preliminary   POC Methamphetamine UR 02/05/2021 None Detected  NONE DETECTED (Cut Off Level 1000 ng/mL) Preliminary   POC Morphine 02/05/2021 None Detected  NONE DETECTED (Cut Off Level 300 ng/mL) Preliminary   POC Oxycodone UR 02/05/2021 None Detected  NONE DETECTED (Cut Off Level 100 ng/mL) Preliminary   POC Methadone UR 02/05/2021 None Detected  NONE DETECTED (Cut Off Level 300 ng/mL) Preliminary   POC Marijuana UR 02/05/2021 None Detected  NONE DETECTED (Cut Off Level 50 ng/mL) Preliminary   TSH 02/05/2021 5.750 (A) 0.350 - 4.500 uIU/mL Final   Comment: Performed by a 3rd Generation assay  with a functional sensitivity of <=0.01 uIU/mL. Performed at [facility], [address], [place], [place] [address]    SARSCOV2ONAVIRUS 2 AG 02/05/2021 NEGATIVE  NEGATIVE Final   Comment: (NOTE) SARS-CoV-2 antigen NOT DETECTED.   Negative results are presumptive.  Negative results do not preclude SARS-CoV-2 infection and should not be used as the sole basis for treatment or other patient management decisions, including infection  control decisions, particularly in the presence of clinical signs and  symptoms consistent with COVID-19, or in those who have been in contact with the virus.  Negative results must be combined with clinical observations, patient history, and epidemiological information. The expected result is Negative.  Fact Sheet  for Patients: https://www.jennings-kim.com/  Fact Sheet for Healthcare Providers: https://alexander-rogers.biz/  This test is not yet approved or cleared by the [place] FDA and  has been authorized for detection and/or diagnosis of SARS-CoV-2 by FDA under an Emergency Use Authorization (EUA).  This EUA will remain in effect (meaning this test can be used) for the duration of  the COV                          ID-19 declaration under Section 564(b)(1) of the Act, 21 U.S.C. section 360bbb-3(b)(1), unless the authorization is terminated or revoked sooner.    Admission on 02/04/2021, Discharged on 02/04/2021  Component Date Value Ref Range Status   SARS Coronavirus 2 by RT PCR 02/04/2021 NEGATIVE  NEGATIVE Final   Comment: (NOTE) SARS-CoV-2 target nucleic acids are NOT DETECTED.  The SARS-CoV-2 RNA is generally detectable in upper respiratory specimens during the acute phase of infection. The lowest concentration of SARS-CoV-2 viral copies this assay can detect is 138 copies/mL. A negative result does not preclude SARS-Cov-2 infection and should not be used as the sole basis for treatment or other patient  management decisions. A negative result may occur with  improper specimen collection/handling, submission of specimen other than nasopharyngeal swab, presence of viral mutation(s) within the areas targeted by this assay, and inadequate number of viral copies(<138 copies/mL). A negative result must be combined with clinical observations, patient history, and epidemiological information. The expected result is Negative.  Fact Sheet for Patients:  BloggerCourse.com  Fact Sheet for Healthcare Providers:  SeriousBroker.it  This test is no                          t yet approved or cleared by the [place] FDA and  has been authorized for detection and/or diagnosis of SARS-CoV-2 by FDA under an Emergency Use Authorization (EUA). This EUA will remain  in effect (meaning this test can be used) for the duration of the COVID-19 declaration under Section 564(b)(1) of the Act, 21 U.S.C.section 360bbb-3(b)(1), unless the authorization is terminated  or revoked sooner.       Influenza A by PCR 02/04/2021 NEGATIVE  NEGATIVE Final   Influenza B by PCR 02/04/2021 NEGATIVE  NEGATIVE Final   Comment: (NOTE) The Xpert Xpress SARS-CoV-2/FLU/RSV plus assay is intended as an aid in the diagnosis of influenza from Nasopharyngeal swab specimens and should not be used as a sole basis for treatment. Nasal washings and aspirates are unacceptable for Xpert Xpress SARS-CoV-2/FLU/RSV testing.  Fact Sheet for Patients: BloggerCourse.com  Fact Sheet for Healthcare Providers: SeriousBroker.it  This test is not yet approved or cleared by the [place] FDA and has been authorized for detection and/or diagnosis of SARS-CoV-2 by FDA under an Emergency Use Authorization (EUA). This EUA will remain in effect (meaning this test can be used) for the duration of the COVID-19 declaration under Section 564(b)(1)  of the Act, 21 U.S.C. section 360bbb-3(b)(1), unless the authorization is terminated or revoked.  Performed at [facility], [address] [place], [place] [address]   Admission on 01/16/2021, Discharged on 01/24/2021  Component Date Value Ref Range Status   Cholesterol 01/17/2021 94  0 - 200 mg/dL Final   Triglycerides 60/45/4098 100  <150 mg/dL Final   HDL 11/91/4782 28 (A) >40 mg/dL Final   Total CHOL/HDL Ratio 01/17/2021 3.4  RATIO Final   VLDL 01/17/2021 20  0 -  40 mg/dL Final   LDL Cholesterol 01/17/2021 46  0 - 99 mg/dL Final   Comment:        Total Cholesterol/HDL:CHD Risk Coronary Heart Disease Risk Table                     Men   Women  1/2 Average Risk   3.4   3.3  Average Risk       5.0   4.4  2 X Average Risk   9.6   7.1  3 X Average Risk  23.4   11.0        Use the calculated Patient Ratio above and the CHD Risk Table to determine the patient's CHD Risk.        ATP III CLASSIFICATION (LDL):  <100     mg/dL   Optimal  161-096  mg/dL   Near or Above                    Optimal  130-159  mg/dL   Borderline  045-409  mg/dL   High  >811     mg/dL   Very High Performed at [facility], [address] [place] [address] [place], [place] [address]    Hgb A1c MFr Bld 01/17/2021 6.0 (A) 4.8 - 5.6 % Final   Comment: (NOTE)         Prediabetes: 5.7 - 6.4         Diabetes: >6.4         Glycemic control for adults with diabetes: <7.0    Mean Plasma Glucose 01/17/2021 126  mg/dL Final   Comment: (NOTE) Performed At: [facility] [address] [place], [place] [id number] Jolene Schimke MD MV:[id number]   Admission on 01/16/2021, Discharged on 01/16/2021  Component Date Value Ref Range Status   Sodium 01/16/2021 134 (A) 135 - 145 mmol/L Final   Potassium 01/16/2021 3.8  3.5 - 5.1 mmol/L Final   Chloride 01/16/2021 96 (A) 98 - 111 mmol/L Final   CO2 01/16/2021 27  22 - 32 mmol/L Final   Glucose, Bld 01/16/2021 79  70 - 99 mg/dL Final   Glucose reference  range applies only to samples taken after fasting for at least 8 hours.   BUN 01/16/2021 8  6 - 20 mg/dL Final   Creatinine, Ser 01/16/2021 0.83  0.61 - 1.24 mg/dL Final   Calcium 84/13/2440 9.3  8.9 - 10.3 mg/dL Final   Total Protein 02/21/2535 7.6  6.5 - 8.1 g/dL Final   Albumin 64/40/3474 4.0  3.5 - 5.0 g/dL Final   AST 25/95/6387 22  15 - 41 U/L Final   ALT 01/16/2021 18  0 - 44 U/L Final   Alkaline Phosphatase 01/16/2021 58  38 - 126 U/L Final   Total Bilirubin 01/16/2021 0.8  0.3 - 1.2 mg/dL Final   GFR, Estimated 01/16/2021 >60  >60 mL/min Final   Comment: (NOTE) Calculated using the CKD-EPI Creatinine Equation (2021)    Anion gap 01/16/2021 11  5 - 15 Final   Performed at [facility] [address] [place], [place] [address]   Alcohol, Ethyl (B) 01/16/2021 <10  <10 mg/dL Final   Comment: (NOTE) Lowest detectable limit for serum alcohol is 10 mg/dL.  For medical purposes only. Performed at [facility], [address] [place] [address] [place], [place] [address]    Salicylate Lvl 01/16/2021 <7.0 (A) 7.0 - 30.0 mg/dL Final   Performed at [facility], [address] [place], [place] [address]   Acetaminophen (Tylenol), Serum 01/16/2021 <10 (A) 10 - 30 ug/mL Final   Comment: (NOTE) Therapeutic concentrations vary significantly. A range of 10-30 ug/mL  may be an effective concentration for many patients. However, some  are best treated at concentrations outside of this range. Acetaminophen concentrations >150 ug/mL at 4 hours after ingestion  and >50 ug/mL at 12 hours after ingestion are often associated with  toxic reactions.  Performed at [facility], [address] [place], [place] [address]    WBC 01/16/2021 8.8  4.0 - 10.5 K/uL Final   RBC 01/16/2021 4.89  4.22 - 5.81 MIL/uL Final   Hemoglobin 01/16/2021 14.4  13.0 - 17.0 g/dL Final   HCT 23/53/6144 41.7  39.0 - 52.0 % Final   MCV 01/16/2021 85.3  80.0 - 100.0 fL Final   MCH 01/16/2021  29.4  26.0 - 34.0 pg Final   MCHC 01/16/2021 34.5  30.0 - 36.0 g/dL Final   RDW 31/54/0086 13.5  11.5 - 15.5 % Final   Platelets 01/16/2021 402 (A) 150 - 400 K/uL Final   nRBC 01/16/2021 0.0  0.0 - 0.2 % Final   Performed at [facility] [address] [place], [place] [address]   Tricyclic, Ur Screen 01/16/2021 NONE DETECTED  NONE DETECTED Final   Amphetamines, Ur Screen 01/16/2021 POSITIVE (A) NONE DETECTED Final   MDMA (Ecstasy)Ur Screen 01/16/2021 NONE DETECTED  NONE DETECTED Final   Cocaine Metabolite,Ur [place] 01/16/2021 NONE DETECTED  NONE DETECTED Final   Opiate, Ur Screen 01/16/2021 NONE DETECTED  NONE DETECTED Final   Phencyclidine (PCP) Ur S 01/16/2021 NONE DETECTED  NONE DETECTED Final   Cannabinoid 50 Ng, Ur [place] 01/16/2021 NONE DETECTED  NONE DETECTED Final   Barbiturates, Ur Screen 01/16/2021 NONE DETECTED  NONE DETECTED Final   Benzodiazepine, Ur Scrn 01/16/2021 NONE DETECTED  NONE DETECTED Final   Methadone Scn, Ur 01/16/2021 NONE DETECTED  NONE DETECTED Final   Comment: (NOTE) Tricyclics + metabolites, urine    Cutoff 1000 ng/mL Amphetamines + metabolites, urine  Cutoff 1000 ng/mL MDMA (Ecstasy), urine              Cutoff 500 ng/mL Cocaine Metabolite, urine          Cutoff 300 ng/mL Opiate + metabolites, urine        Cutoff 300 ng/mL Phencyclidine (PCP), urine         Cutoff 25 ng/mL Cannabinoid, urine                 Cutoff 50 ng/mL Barbiturates + metabolites, urine  Cutoff 200 ng/mL Benzodiazepine, urine              Cutoff 200 ng/mL Methadone, urine                   Cutoff 300 ng/mL  The urine drug screen provides only a preliminary, unconfirmed analytical test result and should not be used for non-medical purposes. Clinical consideration and professional judgment should be applied to any positive drug screen result due to possible interfering substances. A more specific alternate chemical method must be used in order to obtain a confirmed analytical  result. Gas chromatography / mass spectrometry (GC/MS) is the preferred confirm                          atory method. Performed at [facility], [address] [place], [place] [address]  SARS Coronavirus 2 by RT PCR 01/16/2021 NEGATIVE  NEGATIVE Final   Comment: (NOTE) SARS-CoV-2 target nucleic acids are NOT DETECTED.  The SARS-CoV-2 RNA is generally detectable in upper respiratory specimens during the acute phase of infection. The lowest concentration of SARS-CoV-2 viral copies this assay can detect is 138 copies/mL. A negative result does not preclude SARS-Cov-2 infection and should not be used as the sole basis for treatment or other patient management decisions. A negative result may occur with  improper specimen collection/handling, submission of specimen other than nasopharyngeal swab, presence of viral mutation(s) within the areas targeted by this assay, and inadequate number of viral copies(<138 copies/mL). A negative result must be combined with clinical observations, patient history, and epidemiological information. The expected result is Negative.  Fact Sheet for Patients:  BloggerCourse.com  Fact Sheet for Healthcare Providers:  SeriousBroker.it  This test is no                          t yet approved or cleared by the [place] FDA and  has been authorized for detection and/or diagnosis of SARS-CoV-2 by FDA under an Emergency Use Authorization (EUA). This EUA will remain  in effect (meaning this test can be used) for the duration of the COVID-19 declaration under Section 564(b)(1) of the Act, 21 U.S.C.section 360bbb-3(b)(1), unless the authorization is terminated  or revoked sooner.       Influenza A by PCR 01/16/2021 NEGATIVE  NEGATIVE Final   Influenza B by PCR 01/16/2021 NEGATIVE  NEGATIVE Final   Comment: (NOTE) The Xpert Xpress SARS-CoV-2/FLU/RSV plus assay is intended as an aid in the  diagnosis of influenza from Nasopharyngeal swab specimens and should not be used as a sole basis for treatment. Nasal washings and aspirates are unacceptable for Xpert Xpress SARS-CoV-2/FLU/RSV testing.  Fact Sheet for Patients: BloggerCourse.com  Fact Sheet for Healthcare Providers: SeriousBroker.it  This test is not yet approved or cleared by the [place] FDA and has been authorized for detection and/or diagnosis of SARS-CoV-2 by FDA under an Emergency Use Authorization (EUA). This EUA will remain in effect (meaning this test can be used) for the duration of the COVID-19 declaration under Section 564(b)(1) of the Act, 21 U.S.C. section 360bbb-3(b)(1), unless the authorization is terminated or revoked.  Performed at [facility], [address] [place], [place] [address]   Admission on 01/14/2021, Discharged on 01/15/2021  Component Date Value Ref Range Status   WBC 01/14/2021 10.6 (A) 4.0 - 10.5 K/uL Final   RBC 01/14/2021 4.88  4.22 - 5.81 MIL/uL Final   Hemoglobin 01/14/2021 14.1  13.0 - 17.0 g/dL Final   HCT 60/45/4098 42.0  39.0 - 52.0 % Final   MCV 01/14/2021 86.1  80.0 - 100.0 fL Final   MCH 01/14/2021 28.9  26.0 - 34.0 pg Final   MCHC 01/14/2021 33.6  30.0 - 36.0 g/dL Final   RDW 11/91/4782 13.6  11.5 - 15.5 % Final   Platelets 01/14/2021 389  150 - 400 K/uL Final   nRBC 01/14/2021 0.0  0.0 - 0.2 % Final   Neutrophils Relative % 01/14/2021 67  % Final   Neutro Abs 01/14/2021 7.1  1.7 - 7.7 K/uL Final   Lymphocytes Relative 01/14/2021 19  % Final   Lymphs Abs 01/14/2021 2.0  0.7 - 4.0 K/uL Final   Monocytes Relative 01/14/2021 8  % Final   Monocytes Absolute 01/14/2021 0.8  0.1 - 1.0 K/uL Final  Eosinophils Relative 01/14/2021 5  % Final   Eosinophils Absolute 01/14/2021 0.5  0.0 - 0.5 K/uL Final   Basophils Relative 01/14/2021 1  % Final   Basophils Absolute 01/14/2021 0.1  0.0 - 0.1 K/uL Final    Immature Granulocytes 01/14/2021 0  % Final   Abs Immature Granulocytes 01/14/2021 0.03  0.00 - 0.07 K/uL Final   Performed at [facility], [address], [place], [place] [address]   Sodium 01/14/2021 134 (A) 135 - 145 mmol/L Final   Potassium 01/14/2021 4.0  3.5 - 5.1 mmol/L Final   Chloride 01/14/2021 97 (A) 98 - 111 mmol/L Final   CO2 01/14/2021 29  22 - 32 mmol/L Final   Glucose, Bld 01/14/2021 86  70 - 99 mg/dL Final   Glucose reference range applies only to samples taken after fasting for at least 8 hours.   BUN 01/14/2021 13  6 - 20 mg/dL Final   Creatinine, Ser 01/14/2021 0.93  0.61 - 1.24 mg/dL Final   Calcium 60/45/4098 9.0  8.9 - 10.3 mg/dL Final   Total Protein 11/91/4782 7.9  6.5 - 8.1 g/dL Final   Albumin 95/62/1308 4.2  3.5 - 5.0 g/dL Final   AST 65/78/4696 30  15 - 41 U/L Final   ALT 01/14/2021 23  0 - 44 U/L Final   Alkaline Phosphatase 01/14/2021 65  38 - 126 U/L Final   Total Bilirubin 01/14/2021 0.9  0.3 - 1.2 mg/dL Final   GFR, Estimated 01/14/2021 >60  >60 mL/min Final   Comment: (NOTE) Calculated using the CKD-EPI Creatinine Equation (2021)    Anion gap 01/14/2021 8  5 - 15 Final   Performed at [facility], [address], [place], [place] [address]   Opiates 01/15/2021 NONE DETECTED  NONE DETECTED Final   Cocaine 01/15/2021 NONE DETECTED  NONE DETECTED Final   Benzodiazepines 01/15/2021 NONE DETECTED  NONE DETECTED Final   Amphetamines 01/15/2021 POSITIVE (A) NONE DETECTED Final   Tetrahydrocannabinol 01/15/2021 NONE DETECTED  NONE DETECTED Final   Barbiturates 01/15/2021 NONE DETECTED  NONE DETECTED Final   Comment: (NOTE) DRUG SCREEN FOR MEDICAL PURPOSES ONLY.  IF CONFIRMATION IS NEEDED FOR ANY PURPOSE, NOTIFY LAB WITHIN 5 DAYS.  LOWEST DETECTABLE LIMITS FOR URINE DRUG SCREEN Drug Class                     Cutoff (ng/mL) Amphetamine and metabolites    1000 Barbiturate and metabolites    200 Benzodiazepine                 200 Tricyclics and  metabolites     300 Opiates and metabolites        300 Cocaine and metabolites        300 THC                            50 Performed at [facility], [address] [place], [place] [address]    Alcohol, Ethyl (B) 01/14/2021 <10  <10 mg/dL Final   Comment: (NOTE) Lowest detectable limit for serum alcohol is 10 mg/dL.  For medical purposes only. Performed at [facility], [address], [place], [place] [address]   Admission on 12/03/2020, Discharged on 12/03/2020  Component Date Value Ref Range Status   WBC 12/03/2020 8.0  4.0 - 10.5 K/uL Final   RBC 12/03/2020 4.19 (A) 4.22 - 5.81 MIL/uL Final   Hemoglobin 12/03/2020 12.3 (A) 13.0 -  17.0 g/dL Final   HCT 16/01/9603 36.8 (A) 39.0 - 52.0 % Final   MCV 12/03/2020 87.8  80.0 - 100.0 fL Final   MCH 12/03/2020 29.4  26.0 - 34.0 pg Final   MCHC 12/03/2020 33.4  30.0 - 36.0 g/dL Final   RDW 54/12/8117 14.6  11.5 - 15.5 % Final   Platelets 12/03/2020 253  150 - 400 K/uL Final   nRBC 12/03/2020 0.0  0.0 - 0.2 % Final   Performed at [facility], [address], [place], [place] [address]   Sodium 12/03/2020 137  135 - 145 mmol/L Final   Potassium 12/03/2020 4.0  3.5 - 5.1 mmol/L Final   Chloride 12/03/2020 107  98 - 111 mmol/L Final   CO2 12/03/2020 24  22 - 32 mmol/L Final   Glucose, Bld 12/03/2020 96  70 - 99 mg/dL Final   Glucose reference range applies only to samples taken after fasting for at least 8 hours.   BUN 12/03/2020 16  6 - 20 mg/dL Final   Creatinine, Ser 12/03/2020 0.91  0.61 - 1.24 mg/dL Final   Calcium 95/62/1308 8.7 (A) 8.9 - 10.3 mg/dL Final   Total Protein 65/78/4696 6.9  6.5 - 8.1 g/dL Final   Albumin 29/52/8413 4.0  3.5 - 5.0 g/dL Final   AST 24/40/1027 13 (A) 15 - 41 U/L Final   ALT 12/03/2020 14  0 - 44 U/L Final   Alkaline Phosphatase 12/03/2020 61  38 - 126 U/L Final   Total Bilirubin 12/03/2020 0.8  0.3 - 1.2 mg/dL Final   GFR, Estimated 12/03/2020 >60  >60 mL/min Final   Comment: (NOTE) Calculated using  the CKD-EPI Creatinine Equation (2021)    Anion gap 12/03/2020 6  5 - 15 Final   Performed at [facility], [address] [place], [place] [address]   Troponin I (High Sensitivity) 12/03/2020 <2  <18 ng/L Final   Comment: (NOTE) Elevated high sensitivity troponin I (hsTnI) values and significant  changes across serial measurements may suggest ACS but many other  chronic and acute conditions are known to elevate hsTnI results.  Refer to the "Links" section for chest pain algorithms and additional  guidance. Performed at [facility], [address], [place], [place] [address]    Alcohol, Ethyl (B) 12/03/2020 <10  <10 mg/dL Final   Comment: (NOTE) Lowest detectable limit for serum alcohol is 10 mg/dL.  For medical purposes only. Performed at [facility], [address], [place], [place] [address]    Opiates 12/03/2020 NONE DETECTED  NONE DETECTED Final   Cocaine 12/03/2020 NONE DETECTED  NONE DETECTED Final   Benzodiazepines 12/03/2020 NONE DETECTED  NONE DETECTED Final   Amphetamines 12/03/2020 NONE DETECTED  NONE DETECTED Final   Tetrahydrocannabinol 12/03/2020 NONE DETECTED  NONE DETECTED Final   Barbiturates 12/03/2020 NONE DETECTED  NONE DETECTED Final   Comment: (NOTE) DRUG SCREEN FOR MEDICAL PURPOSES ONLY.  IF CONFIRMATION IS NEEDED FOR ANY PURPOSE, NOTIFY LAB WITHIN 5 DAYS.  LOWEST DETECTABLE LIMITS FOR URINE DRUG SCREEN Drug Class                     Cutoff (ng/mL) Amphetamine and metabolites    1000 Barbiturate and metabolites    200 Benzodiazepine                 200 Tricyclics and metabolites     300 Opiates and metabolites        300 Cocaine and metabolites  300 THC                            50 Performed at [facility], [address], [place], [place] [address]    Troponin I (High Sensitivity) 12/03/2020 2  <18 ng/L Final   Comment: (NOTE) Elevated high sensitivity troponin I (hsTnI) values and significant  changes across serial measurements may  suggest ACS but many other  chronic and acute conditions are known to elevate hsTnI results.  Refer to the "Links" section for chest pain algorithms and additional  guidance. Performed at [facility], [address], [place], [place] [address]   Admission on 11/30/2020, Discharged on 12/01/2020  Component Date Value Ref Range Status   Sodium 11/30/2020 138  135 - 145 mmol/L Final   Potassium 11/30/2020 3.8  3.5 - 5.1 mmol/L Final   Chloride 11/30/2020 106  98 - 111 mmol/L Final   CO2 11/30/2020 26  22 - 32 mmol/L Final   Glucose, Bld 11/30/2020 99  70 - 99 mg/dL Final   Glucose reference range applies only to samples taken after fasting for at least 8 hours.   BUN 11/30/2020 8  6 - 20 mg/dL Final   Creatinine, Ser 11/30/2020 0.78  0.61 - 1.24 mg/dL Final   Calcium 99/83/3825 8.7 (A) 8.9 - 10.3 mg/dL Final   Total Protein 05/39/7673 7.1  6.5 - 8.1 g/dL Final   Albumin 41/93/7902 4.0  3.5 - 5.0 g/dL Final   AST 40/97/3532 15  15 - 41 U/L Final   ALT 11/30/2020 18  0 - 44 U/L Final   Alkaline Phosphatase 11/30/2020 69  38 - 126 U/L Final   Total Bilirubin 11/30/2020 0.9  0.3 - 1.2 mg/dL Final   GFR, Estimated 11/30/2020 >60  >60 mL/min Final   Comment: (NOTE) Calculated using the CKD-EPI Creatinine Equation (2021)    Anion gap 11/30/2020 6  5 - 15 Final   Performed at [facility], [address], [place], [place] [address]   Alcohol, Ethyl (B) 11/30/2020 <10  <10 mg/dL Final   Comment: (NOTE) Lowest detectable limit for serum alcohol is 10 mg/dL.  For medical purposes only. Performed at [facility], [address] [place], [place] [address]    WBC 11/30/2020 8.8  4.0 - 10.5 K/uL Final   RBC 11/30/2020 4.40  4.22 - 5.81 MIL/uL Final   Hemoglobin 11/30/2020 12.7 (A) 13.0 - 17.0 g/dL Final   HCT 96/22/2979 38.0 (A) 39.0 - 52.0 % Final   MCV 11/30/2020 86.4  80.0 - 100.0 fL Final   MCH 11/30/2020 28.9  26.0 - 34.0 pg Final   MCHC 11/30/2020 33.4  30.0 - 36.0 g/dL Final   RDW  89/21/1941 14.2  11.5 - 15.5 % Final   Platelets 11/30/2020 276  150 - 400 K/uL Final   nRBC 11/30/2020 0.0  0.0 - 0.2 % Final   Neutrophils Relative % 11/30/2020 66  % Final   Neutro Abs 11/30/2020 5.8  1.7 - 7.7 K/uL Final   Lymphocytes Relative 11/30/2020 25  % Final   Lymphs Abs 11/30/2020 2.2  0.7 - 4.0 K/uL Final   Monocytes Relative 11/30/2020 7  % Final   Monocytes Absolute 11/30/2020 0.6  0.1 - 1.0 K/uL Final   Eosinophils Relative 11/30/2020 2  % Final   Eosinophils Absolute 11/30/2020 0.2  0.0 - 0.5 K/uL Final   Basophils Relative 11/30/2020 0  % Final  Basophils Absolute 11/30/2020 0.0  0.0 - 0.1 K/uL Final   Immature Granulocytes 11/30/2020 0  % Final   Abs Immature Granulocytes 11/30/2020 0.02  0.00 - 0.07 K/uL Final   Performed at [facility], [address] [place], [place] [address]   Acetaminophen (Tylenol), Serum 11/30/2020 <10 (A) 10 - 30 ug/mL Final   Comment: (NOTE) Therapeutic concentrations vary significantly. A range of 10-30 ug/mL  may be an effective concentration for many patients. However, some  are best treated at concentrations outside of this range. Acetaminophen concentrations >150 ug/mL at 4 hours after ingestion  and >50 ug/mL at 12 hours after ingestion are often associated with  toxic reactions.  Performed at [facility], [address] [place], [place] [address]    Salicylate Lvl 11/30/2020 <7.0 (A) 7.0 - 30.0 mg/dL Final   Performed at [facility], [address], [place], [place] [address]   Color, Urine 11/30/2020 YELLOW  YELLOW Final   APPearance 11/30/2020 CLEAR  CLEAR Final   Specific Gravity, Urine 11/30/2020 1.016  1.005 - 1.030 Final   pH 11/30/2020 7.0  5.0 - 8.0 Final   Glucose, UA 11/30/2020 NEGATIVE  NEGATIVE mg/dL Final   Hgb urine dipstick 11/30/2020 NEGATIVE  NEGATIVE Final   Bilirubin Urine 11/30/2020 NEGATIVE  NEGATIVE Final   Ketones, ur 11/30/2020 NEGATIVE  NEGATIVE mg/dL Final   Protein, ur 91/47/8295 NEGATIVE   NEGATIVE mg/dL Final   Nitrite 62/13/0865 NEGATIVE  NEGATIVE Final   Leukocytes,Ua 11/30/2020 NEGATIVE  NEGATIVE Final   Performed at [facility], [address], [place], [place] [address]   Opiates 11/30/2020 NONE DETECTED  NONE DETECTED Final   Cocaine 11/30/2020 NONE DETECTED  NONE DETECTED Final   Benzodiazepines 11/30/2020 NONE DETECTED  NONE DETECTED Final   Amphetamines 11/30/2020 NONE DETECTED  NONE DETECTED Final   Tetrahydrocannabinol 11/30/2020 NONE DETECTED  NONE DETECTED Final   Barbiturates 11/30/2020 NONE DETECTED  NONE DETECTED Final   Comment: (NOTE) DRUG SCREEN FOR MEDICAL PURPOSES ONLY.  IF CONFIRMATION IS NEEDED FOR ANY PURPOSE, NOTIFY LAB WITHIN 5 DAYS.  LOWEST DETECTABLE LIMITS FOR URINE DRUG SCREEN Drug Class                     Cutoff (ng/mL) Amphetamine and metabolites    1000 Barbiturate and metabolites    200 Benzodiazepine                 200 Tricyclics and metabolites     300 Opiates and metabolites        300 Cocaine and metabolites        300 THC                            50 Performed at [facility], [address], [place], [place] [address]   Admission on 11/13/2020, Discharged on 11/15/2020  Component Date Value Ref Range Status   SARS Coronavirus 2 by RT PCR 11/13/2020 NEGATIVE  NEGATIVE Final   Comment: (NOTE) SARS-CoV-2 target nucleic acids are NOT DETECTED.  The SARS-CoV-2 RNA is generally detectable in upper respiratory specimens during the acute phase of infection. The lowest concentration of SARS-CoV-2 viral copies this assay can detect is 138 copies/mL. A negative result does not preclude SARS-Cov-2 infection and should not be used as the sole basis for treatment or other patient management decisions. A negative result may occur with  improper specimen collection/handling, submission of specimen other than nasopharyngeal  swab, presence of viral mutation(s) within the areas targeted by this assay, and inadequate number of  viral copies(<138 copies/mL). A negative result must be combined with clinical observations, patient history, and epidemiological information. The expected result is Negative.  Fact Sheet for Patients:  BloggerCourse.com  Fact Sheet for Healthcare Providers:  SeriousBroker.it  This test is no                          t yet approved or cleared by the [place] FDA and  has been authorized for detection and/or diagnosis of SARS-CoV-2 by FDA under an Emergency Use Authorization (EUA). This EUA will remain  in effect (meaning this test can be used) for the duration of the COVID-19 declaration under Section 564(b)(1) of the Act, 21 U.S.C.section 360bbb-3(b)(1), unless the authorization is terminated  or revoked sooner.       Influenza A by PCR 11/13/2020 NEGATIVE  NEGATIVE Final   Influenza B by PCR 11/13/2020 NEGATIVE  NEGATIVE Final   Comment: (NOTE) The Xpert Xpress SARS-CoV-2/FLU/RSV plus assay is intended as an aid in the diagnosis of influenza from Nasopharyngeal swab specimens and should not be used as a sole basis for treatment. Nasal washings and aspirates are unacceptable for Xpert Xpress SARS-CoV-2/FLU/RSV testing.  Fact Sheet for Patients: BloggerCourse.com  Fact Sheet for Healthcare Providers: SeriousBroker.it  This test is not yet approved or cleared by the [place] FDA and has been authorized for detection and/or diagnosis of SARS-CoV-2 by FDA under an Emergency Use Authorization (EUA). This EUA will remain in effect (meaning this test can be used) for the duration of the COVID-19 declaration under Section 564(b)(1) of the Act, 21 U.S.C. section 360bbb-3(b)(1), unless the authorization is terminated or revoked.  Performed at [facility], [address] [place], [place] [address]    Sodium 11/13/2020 139  135 - 145 mmol/L Final   Potassium  11/13/2020 3.6  3.5 - 5.1 mmol/L Final   Chloride 11/13/2020 105  98 - 111 mmol/L Final   CO2 11/13/2020 26  22 - 32 mmol/L Final   Glucose, Bld 11/13/2020 74  70 - 99 mg/dL Final   Glucose reference range applies only to samples taken after fasting for at least 8 hours.   BUN 11/13/2020 7  6 - 20 mg/dL Final   Creatinine, Ser 11/13/2020 0.81  0.61 - 1.24 mg/dL Final   Calcium 60/45/4098 9.2  8.9 - 10.3 mg/dL Final   Total Protein 11/91/4782 7.1  6.5 - 8.1 g/dL Final   Albumin 95/62/1308 4.0  3.5 - 5.0 g/dL Final   AST 65/78/4696 18  15 - 41 U/L Final   ALT 11/13/2020 16  0 - 44 U/L Final   Alkaline Phosphatase 11/13/2020 56  38 - 126 U/L Final   Total Bilirubin 11/13/2020 0.9  0.3 - 1.2 mg/dL Final   GFR, Estimated 11/13/2020 >60  >60 mL/min Final   Comment: (NOTE) Calculated using the CKD-EPI Creatinine Equation (2021)    Anion gap 11/13/2020 8  5 - 15 Final   Performed at [facility], [address] [place], [place] [address]   Alcohol, Ethyl (B) 11/13/2020 <10  <10 mg/dL Final   Comment: (NOTE) Lowest detectable limit for serum alcohol is 10 mg/dL.  For medical purposes only. Performed at [facility], [address] [place], [place] [address]    Tricyclic, Ur Screen 11/14/2020 NONE DETECTED  NONE DETECTED Final   Amphetamines, Ur  Screen 11/14/2020 NONE DETECTED  NONE DETECTED Final   MDMA (Ecstasy)Ur Screen 11/14/2020 NONE DETECTED  NONE DETECTED Final   Cocaine Metabolite,Ur [place] 11/14/2020 NONE DETECTED  NONE DETECTED Final   Opiate, Ur Screen 11/14/2020 NONE DETECTED  NONE DETECTED Final   Phencyclidine (PCP) Ur S 11/14/2020 NONE DETECTED  NONE DETECTED Final   Cannabinoid 50 Ng, Ur [place] 11/14/2020 NONE DETECTED  NONE DETECTED Final   Barbiturates, Ur Screen 11/14/2020 NONE DETECTED  NONE DETECTED Final   Benzodiazepine, Ur Scrn 11/14/2020 NONE DETECTED  NONE DETECTED Final   Methadone Scn, Ur 11/14/2020 NONE DETECTED  NONE DETECTED Final   Comment:  (NOTE) Tricyclics + metabolites, urine    Cutoff 1000 ng/mL Amphetamines + metabolites, urine  Cutoff 1000 ng/mL MDMA (Ecstasy), urine              Cutoff 500 ng/mL Cocaine Metabolite, urine          Cutoff 300 ng/mL Opiate + metabolites, urine        Cutoff 300 ng/mL Phencyclidine (PCP), urine         Cutoff 25 ng/mL Cannabinoid, urine                 Cutoff 50 ng/mL Barbiturates + metabolites, urine  Cutoff 200 ng/mL Benzodiazepine, urine              Cutoff 200 ng/mL Methadone, urine                   Cutoff 300 ng/mL  The urine drug screen provides only a preliminary, unconfirmed analytical test result and should not be used for non-medical purposes. Clinical consideration and professional judgment should be applied to any positive drug screen result due to possible interfering substances. A more specific alternate chemical method must be used in order to obtain a confirmed analytical result. Gas chromatography / mass spectrometry (GC/MS) is the preferred confirm                          atory method. Performed at [facility], [address] [place], [place] [address]    WBC 11/13/2020 8.4  4.0 - 10.5 K/uL Final   RBC 11/13/2020 4.34  4.22 - 5.81 MIL/uL Final   Hemoglobin 11/13/2020 12.3 (A) 13.0 - 17.0 g/dL Final   HCT 60/45/4098 37.3 (A) 39.0 - 52.0 % Final   MCV 11/13/2020 85.9  80.0 - 100.0 fL Final   MCH 11/13/2020 28.3  26.0 - 34.0 pg Final   MCHC 11/13/2020 33.0  30.0 - 36.0 g/dL Final   RDW 11/91/4782 14.0  11.5 - 15.5 % Final   Platelets 11/13/2020 286  150 - 400 K/uL Final   nRBC 11/13/2020 0.0  0.0 - 0.2 % Final   Neutrophils Relative % 11/13/2020 64  % Final   Neutro Abs 11/13/2020 5.4  1.7 - 7.7 K/uL Final   Lymphocytes Relative 11/13/2020 26  % Final   Lymphs Abs 11/13/2020 2.2  0.7 - 4.0 K/uL Final   Monocytes Relative 11/13/2020 7  % Final   Monocytes Absolute 11/13/2020 0.6  0.1 - 1.0 K/uL Final   Eosinophils Relative 11/13/2020 3  % Final    Eosinophils Absolute 11/13/2020 0.2  0.0 - 0.5 K/uL Final   Basophils Relative 11/13/2020 0  % Final   Basophils Absolute 11/13/2020 0.0  0.0 - 0.1 K/uL Final   Immature Granulocytes 11/13/2020 0  % Final   Abs Immature Granulocytes  11/13/2020 0.02  0.00 - 0.07 K/uL Final   Performed at [facility], [address] [place], [place] [address]   Salicylate Lvl 11/13/2020 <7.0 (A) 7.0 - 30.0 mg/dL Final   Performed at [facility], [address] [place], [place] [address]   Acetaminophen (Tylenol), Serum 11/13/2020 <10 (A) 10 - 30 ug/mL Final   Comment: (NOTE) Therapeutic concentrations vary significantly. A range of 10-30 ug/mL  may be an effective concentration for many patients. However, some  are best treated at concentrations outside of this range. Acetaminophen concentrations >150 ug/mL at 4 hours after ingestion  and >50 ug/mL at 12 hours after ingestion are often associated with  toxic reactions.  Performed at [facility], [address] [place], [place] [address]   Admission on 11/08/2020, Discharged on 11/09/2020  Component Date Value Ref Range Status   Opiates 11/08/2020 NONE DETECTED  NONE DETECTED Final   Cocaine 11/08/2020 NONE DETECTED  NONE DETECTED Final   Benzodiazepines 11/08/2020 NONE DETECTED  NONE DETECTED Final   Amphetamines 11/08/2020 POSITIVE (A) NONE DETECTED Final   Tetrahydrocannabinol 11/08/2020 NONE DETECTED  NONE DETECTED Final   Barbiturates 11/08/2020 NONE DETECTED  NONE DETECTED Final   Comment: (NOTE) DRUG SCREEN FOR MEDICAL PURPOSES ONLY.  IF CONFIRMATION IS NEEDED FOR ANY PURPOSE, NOTIFY LAB WITHIN 5 DAYS.  LOWEST DETECTABLE LIMITS FOR URINE DRUG SCREEN Drug Class                     Cutoff (ng/mL) Amphetamine and metabolites    1000 Barbiturate and metabolites    200 Benzodiazepine                 200 Tricyclics and metabolites     300 Opiates and metabolites        300 Cocaine and metabolites         300 THC                            50 Performed at [facility], [address], [place], [place] [address]    Sodium 11/08/2020 138  135 - 145 mmol/L Final   Potassium 11/08/2020 4.0  3.5 - 5.1 mmol/L Final   Chloride 11/08/2020 100  98 - 111 mmol/L Final   CO2 11/08/2020 27  22 - 32 mmol/L Final   Glucose, Bld 11/08/2020 117 (A) 70 - 99 mg/dL Final   Glucose reference range applies only to samples taken after fasting for at least 8 hours.   BUN 11/08/2020 15  6 - 20 mg/dL Final   Creatinine, Ser 11/08/2020 1.08  0.61 - 1.24 mg/dL Final   Calcium 29/56/2130 9.4  8.9 - 10.3 mg/dL Final   Total Protein 86/57/8469 8.1  6.5 - 8.1 g/dL Final   Albumin 62/95/2841 4.5  3.5 - 5.0 g/dL Final   AST 32/44/0102 26  15 - 41 U/L Final   ALT 11/08/2020 34  0 - 44 U/L Final   Alkaline Phosphatase 11/08/2020 75  38 - 126 U/L Final   Total Bilirubin 11/08/2020 1.9 (A) 0.3 - 1.2 mg/dL Final   GFR, Estimated 11/08/2020 >60  >60 mL/min Final   Comment: (NOTE) Calculated using the CKD-EPI Creatinine Equation (2021)    Anion gap 11/08/2020 11  5 - 15 Final   Performed at [facility], [address] [place], [place] [address]   Alcohol, Ethyl (B) 11/08/2020 <10  <10 mg/dL Final  Comment: (NOTE) Lowest detectable limit for serum alcohol is 10 mg/dL.  For medical purposes only. Performed at [facility], [address] [place], [place] [address]    Salicylate Lvl 11/08/2020 <7.0 (A) 7.0 - 30.0 mg/dL Final   Performed at [facility], [address] [place], [place] [address]   Acetaminophen (Tylenol), Serum 11/08/2020 <10 (A) 10 - 30 ug/mL Final   Comment: (NOTE) Therapeutic concentrations vary significantly. A range of 10-30 ug/mL  may be an effective concentration for many patients. However, some  are best treated at concentrations outside of this range. Acetaminophen concentrations >150 ug/mL at 4 hours after ingestion  and >50 ug/mL at 12 hours after ingestion are often associated with  toxic  reactions.  Performed at [facility], [address], [place], [place] [address]    WBC 11/08/2020 11.1 (A) 4.0 - 10.5 K/uL Final   RBC 11/08/2020 5.16  4.22 - 5.81 MIL/uL Final   Hemoglobin 11/08/2020 14.9  13.0 - 17.0 g/dL Final   HCT 91/47/8295 44.5  39.0 - 52.0 % Final   MCV 11/08/2020 86.2  80.0 - 100.0 fL Final   MCH 11/08/2020 28.9  26.0 - 34.0 pg Final   MCHC 11/08/2020 33.5  30.0 - 36.0 g/dL Final   RDW 62/13/0865 14.3  11.5 - 15.5 % Final   Platelets 11/08/2020 368  150 - 400 K/uL Final   nRBC 11/08/2020 0.0  0.0 - 0.2 % Final   Performed at [facility], [address] [place], [place] [address]  Admission on 10/28/2020, Discharged on 10/30/2020  Component Date Value Ref Range Status   WBC 10/28/2020 10.2  4.0 - 10.5 K/uL Final   RBC 10/28/2020 5.08  4.22 - 5.81 MIL/uL Final   Hemoglobin 10/28/2020 14.6  13.0 - 17.0 g/dL Final   HCT 62/95/2841 44.0  39.0 - 52.0 % Final   MCV 10/28/2020 86.6  80.0 - 100.0 fL Final   MCH 10/28/2020 28.7  26.0 - 34.0 pg Final   MCHC 10/28/2020 33.2  30.0 - 36.0 g/dL Final   RDW 32/44/0102 13.8  11.5 - 15.5 % Final   Platelets 10/28/2020 333  150 - 400 K/uL Final   nRBC 10/28/2020 0.0  0.0 - 0.2 % Final   Neutrophils Relative % 10/28/2020 64  % Final   Neutro Abs 10/28/2020 6.4  1.7 - 7.7 K/uL Final   Lymphocytes Relative 10/28/2020 22  % Final   Lymphs Abs 10/28/2020 2.2  0.7 - 4.0 K/uL Final   Monocytes Relative 10/28/2020 9  % Final   Monocytes Absolute 10/28/2020 0.9  0.1 - 1.0 K/uL Final   Eosinophils Relative 10/28/2020 5  % Final   Eosinophils Absolute 10/28/2020 0.5  0.0 - 0.5 K/uL Final   Basophils Relative 10/28/2020 0  % Final   Basophils Absolute 10/28/2020 0.0  0.0 - 0.1 K/uL Final   Immature Granulocytes 10/28/2020 0  % Final   Abs Immature Granulocytes 10/28/2020 0.02  0.00 - 0.07 K/uL Final   Performed at [facility], [address], [place], [place] [address]   Sodium 10/28/2020 142  135 - 145 mmol/L Final   Potassium  10/28/2020 4.5  3.5 - 5.1 mmol/L Final   Chloride 10/28/2020 104  98 - 111 mmol/L Final   CO2 10/28/2020 27  22 - 32 mmol/L Final   Glucose, Bld 10/28/2020 103 (A) 70 - 99 mg/dL Final   Glucose reference range applies only to samples taken after fasting for at least 8  hours.   BUN 10/28/2020 17  6 - 20 mg/dL Final   Creatinine, Ser 10/28/2020 1.14  0.61 - 1.24 mg/dL Final   Calcium 00/17/4944 9.7  8.9 - 10.3 mg/dL Final   Total Protein 96/75/9163 8.3 (A) 6.5 - 8.1 g/dL Final   Albumin 84/66/5993 4.4  3.5 - 5.0 g/dL Final   AST 57/04/7791 26  15 - 41 U/L Final   ALT 10/28/2020 24  0 - 44 U/L Final   Alkaline Phosphatase 10/28/2020 65  38 - 126 U/L Final   Total Bilirubin 10/28/2020 1.9 (A) 0.3 - 1.2 mg/dL Final   GFR, Estimated 10/28/2020 >60  >60 mL/min Final   Comment: (NOTE) Calculated using the CKD-EPI Creatinine Equation (2021)    Anion gap 10/28/2020 11  5 - 15 Final   Performed at [facility], [address], [place], [place] [address]   Lipase 10/28/2020 25  11 - 51 U/L Final   Performed at [facility], [address], [place], [place] [address]   Color, Urine 10/28/2020 YELLOW  YELLOW Final   APPearance 10/28/2020 CLEAR  CLEAR Final   Specific Gravity, Urine 10/28/2020 1.023  1.005 - 1.030 Final   pH 10/28/2020 5.0  5.0 - 8.0 Final   Glucose, UA 10/28/2020 NEGATIVE  NEGATIVE mg/dL Final   Hgb urine dipstick 10/28/2020 SMALL (A) NEGATIVE Final   Bilirubin Urine 10/28/2020 NEGATIVE  NEGATIVE Final   Ketones, ur 10/28/2020 20 (A) NEGATIVE mg/dL Final   Protein, ur 07/62/2633 NEGATIVE  NEGATIVE mg/dL Final   Nitrite 35/45/6256 NEGATIVE  NEGATIVE Final   Leukocytes,Ua 10/28/2020 NEGATIVE  NEGATIVE Final   WBC, UA 10/28/2020 0-5  0 - 5 WBC/hpf Final   Bacteria, UA 10/28/2020 RARE (A) NONE SEEN Final   Squamous Epithelial / LPF 10/28/2020 0-5  0 - 5 Final   Mucus 10/28/2020 PRESENT   Final   Hyaline Casts, UA 10/28/2020 PRESENT   Final   Performed at [facility], [address], [place], [place] [address]   SARS Coronavirus 2 by RT PCR 10/28/2020 NEGATIVE  NEGATIVE Final   Comment: (NOTE) SARS-CoV-2 target nucleic acids are NOT DETECTED.  The SARS-CoV-2 RNA is generally detectable in upper respiratory specimens during the acute phase of infection. The lowest concentration of SARS-CoV-2 viral copies this assay can detect is 138 copies/mL. A negative result does not preclude SARS-Cov-2 infection and should not be used as the sole basis for treatment or other patient management decisions. A negative result may occur with  improper specimen collection/handling, submission of specimen other than nasopharyngeal swab, presence of viral mutation(s) within the areas targeted by this assay, and inadequate number of viral copies(<138 copies/mL). A negative result must be combined with clinical observations, patient history, and epidemiological information. The expected result is Negative.  Fact Sheet for Patients:  BloggerCourse.com  Fact Sheet for Healthcare Providers:  SeriousBroker.it  This test is no                          t yet approved or cleared by the [place] FDA and  has been authorized for detection and/or diagnosis of SARS-CoV-2 by FDA under an Emergency Use Authorization (EUA). This EUA will remain  in effect (meaning this test can be used) for the duration of the COVID-19 declaration under Section 564(b)(1) of the Act, 21 U.S.C.section 360bbb-3(b)(1), unless the authorization is terminated  or revoked sooner.       Influenza A by PCR  10/28/2020 NEGATIVE  NEGATIVE Final   Influenza B by PCR 10/28/2020 NEGATIVE  NEGATIVE Final   Comment: (NOTE) The Xpert Xpress SARS-CoV-2/FLU/RSV plus assay is intended as an aid in the diagnosis of influenza from Nasopharyngeal swab specimens and should not be used as a sole basis for treatment. Nasal washings and aspirates are unacceptable for Xpert Xpress  SARS-CoV-2/FLU/RSV testing.  Fact Sheet for Patients: BloggerCourse.com  Fact Sheet for Healthcare Providers: SeriousBroker.it  This test is not yet approved or cleared by the [place] FDA and has been authorized for detection and/or diagnosis of SARS-CoV-2 by FDA under an Emergency Use Authorization (EUA). This EUA will remain in effect (meaning this test can be used) for the duration of the COVID-19 declaration under Section 564(b)(1) of the Act, 21 U.S.C. section 360bbb-3(b)(1), unless the authorization is terminated or revoked.  Performed at [facility], [address], [place], [place] [address]    Alcohol, Ethyl (B) 10/28/2020 <10  <10 mg/dL Final   Comment: (NOTE) Lowest detectable limit for serum alcohol is 10 mg/dL.  For medical purposes only. Performed at [facility], [address] [place], [place] [address]    Opiates 10/28/2020 NONE DETECTED  NONE DETECTED Final   Cocaine 10/28/2020 NONE DETECTED  NONE DETECTED Final   Benzodiazepines 10/28/2020 NONE DETECTED  NONE DETECTED Final   Amphetamines 10/28/2020 POSITIVE (A) NONE DETECTED Final   Tetrahydrocannabinol 10/28/2020 NONE DETECTED  NONE DETECTED Final   Barbiturates 10/28/2020 NONE DETECTED  NONE DETECTED Final   Comment: (NOTE) DRUG SCREEN FOR MEDICAL PURPOSES ONLY.  IF CONFIRMATION IS NEEDED FOR ANY PURPOSE, NOTIFY LAB WITHIN 5 DAYS.  LOWEST DETECTABLE LIMITS FOR URINE DRUG SCREEN Drug Class                     Cutoff (ng/mL) Amphetamine and metabolites    1000 Barbiturate and metabolites    200 Benzodiazepine                 200 Tricyclics and metabolites     300 Opiates and metabolites        300 Cocaine and metabolites        300 THC                            50 Performed at [facility], [address] [place], [place] [address]   There may be more visits with results that are not included.    Allergies: Patient has no known  allergies.  PTA Medications: (Not in a hospital admission)   Medical Decision Making  Patient is a 30 year old male with past psychiatric and medical history as stated above who presents to the [facility] behavioral health urgent care via law enforcement for SI with plan.  Based on patient's current presentation including SI with plan and patient's inability to contract for safety at this time, believe that the patient is a potential threat to himself at this time and recommend continuous assessment for the patient at this time.    Recommendations  Based on my evaluation the patient does not appear to have an emergency medical condition.  Patient will be placed in [facility] continuous assessment for further stabilization and treatment.  Patient will be reevaluated by the treatment team on 02/05/2021 and disposition to be determined at that time.  Labs/tests ordered and reviewed:  -PCR Flu A&B, COVID: Negative  -UDS: Negative for all substances  -CBC with differential: Within  normal limits  -CMP: Within normal limits  -Ethanol: Less than 10 mg/dL/within normal limits  -TSH: Elevated at 5.750 uIU/mL (reference range 0.350-4.500 uIU/mL).  Patient has history of elevated TSH, but last TSH value from 10/08/2028 was within normal limits at 1.811 uIU/mL    -Free T3 and free T4 ordered on to patient's blood draw.  Results pending.  -EKG ordered to check patient's QT/QTC due to patient's history of taking antipsychotic medications and for reinitiation of antipsychotic medication.  Patient's EKG shows sinus bradycardia with a ventricular rate of 50 bpm and some early repolarization, but shows no acute/concerning findings or signs of ischemia with a PR interval 170 ms, QRS 84 ms, and QT/QTC 410/373 ms.  Patient's QT/QTC is appropriate for reinitiation of antipsychotic medications at this time.  01/17/2021 hemoglobin A1c and lipid panel values were reviewed due to patient being on antipsychotic medication.   01/17/2021 hemoglobin A1c in the prediabetic range at 6.0%.  01/17/2021 lipid panel shows decreased HDL at 28 mg/dL, but lipid panel otherwise unremarkable.  Patient has not been taking any psychotropic medications since he was discharged from [facility] on 01/24/2021.  We will reinitiate the following medications:  -Sertraline 50 mg p.o. daily at bedtime for MDD  -Due to patient being noncompliant with Seroquel, we will reinitiate Seroquel at lower starting dose of 50 mg p.o. at bedtime for depressive symptoms, mood stability  -Will reinitiate trazodone at 50 mg p.o. at bedtime as needed for sleep  -We will reinitiate Vistaril 25 mg p.o. 3 times daily as needed for anxiety  We will continue patient's home medication of albuterol 2 puffs inhalation every 4 hours as needed for wheezing/shortness of breath  Jaclyn Shaggy, PA-C 02/05/21  5:59 AM

## 2021-02-05 NOTE — Progress Notes (Signed)
Pt is asleep.respirations are even and unlabored. No signs of acute distress noted. Staff will monitor for pt's safety. 

## 2021-02-05 NOTE — ED Notes (Signed)
Pt A&O x 4, presents with suicidal ideations, plan to cut himself with a knife.  Pt reports he had a knife earlier today but didn't use it.  Denies HI or AVH.  Pt calm & cooperative, no distress noted.  Monitoring for safety.

## 2021-02-05 NOTE — Discharge Instructions (Signed)
Take all medications as prescribed. Keep all follow-up appointments as scheduled.  Do not consume alcohol or use illegal drugs while on prescription medications. Report any adverse effects from your medications to your primary care provider promptly.  In the event of recurrent symptoms or worsening symptoms, call 911, a crisis hotline, or go to the nearest emergency department for evaluation.   

## 2021-02-05 NOTE — ED Notes (Signed)
GIVEN BREAKFAST  

## 2021-02-05 NOTE — Discharge Summary (Signed)
Nathan Koch to be D/C'd Home per NP order. Discussed with the patient and all questions fully answered. An After Visit Summary was printed and given to the patient. Patient escorted out and D/C home via private auto.  [name]  02/05/2021 12:18 PM
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
## 2021-02-05 NOTE — Progress Notes (Signed)
Pt is awake, alert and oriented. Pt complained of back pain. Pt declined PRN Tylenol when offered. No distress noted. Pt endorses passive SI. Pt verbally contracts for safety on the unit. Pt denies current HI/AVH.  Staff will monitor for pt's safety.

## 2021-02-06 LAB — T3, FREE: T3, Free: 3.8 pg/mL (ref 2.0–4.4)

## 2021-02-11 ENCOUNTER — Emergency Department (HOSPITAL_COMMUNITY)
Admission: EM | Admit: 2021-02-11 | Discharge: 2021-02-12 | Disposition: A | Discharge status: Home / Self Care | Payer: Self-pay | Attending: Emergency Medicine | Admitting: Emergency Medicine

## 2021-02-11 ENCOUNTER — Encounter (HOSPITAL_COMMUNITY): Payer: Self-pay | Admitting: Pharmacy Technician

## 2021-02-11 ENCOUNTER — Telehealth (HOSPITAL_COMMUNITY): Payer: Self-pay

## 2021-02-11 ENCOUNTER — Other Ambulatory Visit: Payer: Self-pay

## 2021-02-11 DIAGNOSIS — Z79899 Other long term (current) drug therapy: Secondary | ICD-10-CM | POA: Insufficient documentation

## 2021-02-11 DIAGNOSIS — Y900 Blood alcohol level of less than 20 mg/100 ml: Secondary | ICD-10-CM | POA: Insufficient documentation

## 2021-02-11 DIAGNOSIS — R45851 Suicidal ideations: Secondary | ICD-10-CM | POA: Insufficient documentation

## 2021-02-11 DIAGNOSIS — F1594 Other stimulant use, unspecified with stimulant-induced mood disorder: Secondary | ICD-10-CM

## 2021-02-11 DIAGNOSIS — Z20822 Contact with and (suspected) exposure to covid-19: Secondary | ICD-10-CM | POA: Insufficient documentation

## 2021-02-11 DIAGNOSIS — F1520 Other stimulant dependence, uncomplicated: Secondary | ICD-10-CM | POA: Diagnosis present

## 2021-02-11 DIAGNOSIS — F17210 Nicotine dependence, cigarettes, uncomplicated: Secondary | ICD-10-CM | POA: Insufficient documentation

## 2021-02-11 DIAGNOSIS — F332 Major depressive disorder, recurrent severe without psychotic features: Secondary | ICD-10-CM | POA: Diagnosis present

## 2021-02-11 DIAGNOSIS — Z91040 Latex allergy status: Secondary | ICD-10-CM | POA: Insufficient documentation

## 2021-02-11 DIAGNOSIS — J45909 Unspecified asthma, uncomplicated: Secondary | ICD-10-CM | POA: Insufficient documentation

## 2021-02-11 DIAGNOSIS — Z046 Encounter for general psychiatric examination, requested by authority: Secondary | ICD-10-CM | POA: Insufficient documentation

## 2021-02-11 LAB — RESP PANEL BY RT-PCR (FLU A&B, COVID) ARPGX2
Influenza A by PCR: NEGATIVE
Influenza B by PCR: NEGATIVE
SARS Coronavirus 2 by RT PCR: NEGATIVE

## 2021-02-11 LAB — RAPID URINE DRUG SCREEN, HOSP PERFORMED
Amphetamines: POSITIVE — AB
Barbiturates: NOT DETECTED
Benzodiazepines: NOT DETECTED
Cocaine: NOT DETECTED
Opiates: NOT DETECTED
Tetrahydrocannabinol: NOT DETECTED

## 2021-02-11 LAB — COMPREHENSIVE METABOLIC PANEL
ALT: 19 U/L (ref 0–44)
AST: 40 U/L (ref 15–41)
Albumin: 4.6 g/dL (ref 3.5–5.0)
Alkaline Phosphatase: 83 U/L (ref 38–126)
Anion gap: 13 (ref 5–15)
BUN: 12 mg/dL (ref 6–20)
CO2: 24 mmol/L (ref 22–32)
Calcium: 10.2 mg/dL (ref 8.9–10.3)
Chloride: 103 mmol/L (ref 98–111)
Creatinine, Ser: 0.89 mg/dL (ref 0.61–1.24)
GFR, Estimated: >60 mL/min (ref >60)
Glucose, Bld: 89 mg/dL (ref 70–99)
Potassium: 4.1 mmol/L (ref 3.5–5.1)
Sodium: 140 mmol/L (ref 135–145)
Total Bilirubin: 1.7 mg/dL — ABNORMAL HIGH (ref 0.3–1.2)
Total Protein: 8.4 g/dL — ABNORMAL HIGH (ref 6.5–8.1)

## 2021-02-11 LAB — CBC
HCT: 46.5 % (ref 39.0–52.0)
Hemoglobin: 15.5 g/dL (ref 13.0–17.0)
MCH: 28.3 pg (ref 26.0–34.0)
MCHC: 33.3 g/dL (ref 30.0–36.0)
MCV: 85 fL (ref 80.0–100.0)
Platelets: 337 10*3/uL (ref 150–400)
RBC: 5.47 MIL/uL (ref 4.22–5.81)
RDW: 13.8 % (ref 11.5–15.5)
WBC: 11.6 10*3/uL — ABNORMAL HIGH (ref 4.0–10.5)
nRBC: 0 % (ref 0.0–0.2)

## 2021-02-11 LAB — ACETAMINOPHEN LEVEL: Acetaminophen (Tylenol), Serum: <10 ug/mL — ABNORMAL LOW (ref 10–30)

## 2021-02-11 LAB — ETHANOL: Alcohol, Ethyl (B): <10 mg/dL (ref <10)

## 2021-02-11 LAB — SALICYLATE LEVEL: Salicylate Lvl: <7 mg/dL — ABNORMAL LOW (ref 7.0–30.0)

## 2021-02-11 MED ORDER — ACETAMINOPHEN 325 MG PO TABS
650.0000 mg | ORAL_TABLET | ORAL | Status: DC | PRN
  Administered 2021-02-12: 650 mg via ORAL
  Filled 2021-02-11: qty 2

## 2021-02-11 MED ORDER — SERTRALINE HCL 50 MG PO TABS
50.0000 mg | ORAL_TABLET | Freq: Every day | ORAL | Status: DC
  Administered 2021-02-11: 50 mg via ORAL
  Filled 2021-02-11: qty 1

## 2021-02-11 MED ORDER — QUETIAPINE FUMARATE 50 MG PO TABS
50.0000 mg | ORAL_TABLET | Freq: Every day | ORAL | Status: DC
  Administered 2021-02-11: 50 mg via ORAL
  Filled 2021-02-11: qty 1

## 2021-02-11 MED ORDER — ALBUTEROL SULFATE HFA 108 (90 BASE) MCG/ACT IN AERS
2.0000 | INHALATION_SPRAY | RESPIRATORY_TRACT | Status: DC | PRN
  Administered 2021-02-11: 2 via RESPIRATORY_TRACT
  Filled 2021-02-11: qty 6.7

## 2021-02-11 MED ORDER — LOPERAMIDE HCL 2 MG PO CAPS
4.0000 mg | ORAL_CAPSULE | Freq: Once | ORAL | Status: AC
  Administered 2021-02-11: 4 mg via ORAL
  Filled 2021-02-11: qty 2

## 2021-02-11 MED ORDER — HYDROXYZINE HCL 25 MG PO TABS
50.0000 mg | ORAL_TABLET | Freq: Once | ORAL | Status: AC
  Administered 2021-02-11: 50 mg via ORAL
  Filled 2021-02-11: qty 2

## 2021-02-11 NOTE — ED Notes (Signed)
TTS in process 

## 2021-02-11 NOTE — BH Assessment (Signed)
Care Management - Follow Up [facility] Discharges   Writer attempted to make contact with patient today and was unsuccessful.  Writer left a HIPPA compliant voice message.   

## 2021-02-11 NOTE — ED Notes (Signed)
Dinner tray ordered.

## 2021-02-11 NOTE — ED Provider Notes (Signed)
[facility] EMERGENCY DEPARTMENT Provider Note   CSN: [id number] Arrival date & time: 02/11/21  1124     History Chief Complaint  Patient presents with   Suicidal    Nathan Koch is a 30 y.o. male.  HPI With a history of anxiety, bipolar, depression presents with suicidal ideation.  He denies pain.  He notes that he relapsed earlier today.  He began using methamphetamine, snorting it.  Last use was almost 1 month ago.  He notes that there is marital discord due to his substance abuse.  He notes that he feels despondent after having relapse, with suicidal ideation without a discrete plan.  Though he reported hallucinations to a colleague, he does not report this to me.  Since onset of a suicidal ideation no clear alleviating or exacerbating factors.    Past Medical History:  Diagnosis Date   Anxiety    Anxiety disorder    Asthma    Bipolar 1 disorder ([name])    Depression    Methamphetamine abuse ([name])    PTSD (post-traumatic stress disorder)     Patient Active Problem List   Diagnosis Date Noted   Severe recurrent major depression without psychotic features ([name]) 01/16/2021   Bipolar 1 disorder, depressed ([name])    MDD (major depressive disorder), recurrent episode, severe ([name]) 09/11/2020   MDD (major depressive disorder), recurrent severe, without psychosis ([name]) 09/10/2020   Depression, major, recurrent, 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   Methamphetamine use disorder, moderate ([name]) 09/14/2019   MDD (major depressive disorder), recurrent, severe, with psychosis ([name]) 09/14/2019   MDD (major depressive disorder), single episode, severe with psychosis ([name]) 09/14/2019   Tobacco use disorder 02/21/2019   PTSD (post-traumatic stress disorder) 02/21/2019   Benzodiazepine abuse ([name]) 11/15/2017   Asthma 06/26/2014    Past Surgical History:  Procedure Laterality Date   CLOSED REDUCTION MANDIBLE N/A 07/02/2018   Procedure: CLOSED REDUCTION  MANDIBULAR;  Surgeon: Vernie Murders, MD;  Location: [facility] ORS;  Service: ENT;  Laterality: N/A;       Family History  Problem Relation Age of Onset   Asthma Mother    Cancer Mother        breast cancer   Ulcers Mother    Cancer Father        esophogeal cancer   Hypertension Father    Asthma Brother     Social History   Tobacco Use   Smoking status: Every Day    Packs/day: 0.50    Years: 8.00    Pack years: 4.00    Types: Cigarettes   Smokeless tobacco: Never  Vaping Use   Vaping Use: Every day   Substances: Nicotine, Flavoring  Substance Use Topics   Alcohol use: Not Currently    Alcohol/week: 13.0 standard drinks    Types: 6 Cans of beer, 7 Standard drinks or equivalent per week    Comment: previously heavy drinker 2016. most recent 4 beer/ day drinker and none since 01-11-2020   Drug use: Yes    Types: Methamphetamines    Comment: states he uses 1 g every other day    Home Medications Prior to Admission medications   Medication Sig Start Date End Date Taking? Authorizing Provider  albuterol (VENTOLIN HFA) 108 (90 Base) MCG/ACT inhaler Inhale 2 puffs into the lungs every 4 (four) hours as needed for wheezing or shortness of breath. 01/23/21   Jesse Sans, MD  QUEtiapine (SEROQUEL) 50 MG tablet Take 1 tablet (50 mg total)  by mouth at bedtime. 02/05/21   Oneta Rack, NP  sertraline (ZOLOFT) 50 MG tablet Take 1 tablet (50 mg total) by mouth at bedtime. 02/05/21   Oneta Rack, NP    Allergies    Latex  Review of Systems   Review of Systems  Constitutional:        Per HPI, otherwise negative  HENT:         Per HPI, otherwise negative  Respiratory:         Per HPI, otherwise negative  Cardiovascular:        Per HPI, otherwise negative  Gastrointestinal:  Negative for vomiting.  Endocrine:       Negative aside from HPI  Genitourinary:        Neg aside from HPI   Musculoskeletal:        Per HPI, otherwise negative  Skin: Negative.    Neurological:  Negative for syncope.  Psychiatric/Behavioral:  Positive for suicidal ideas. The patient is nervous/anxious.    Physical Exam Updated Vital Signs BP 123/85   Pulse 92   Resp 16   SpO2 100%   Physical Exam Vitals and nursing note reviewed.  Constitutional:      General: He is not in acute distress.    Appearance: He is well-developed.  HENT:     Head: Normocephalic and atraumatic.  Eyes:     Conjunctiva/sclera: Conjunctivae normal.  Pulmonary:     Effort: Pulmonary effort is normal. No respiratory distress.     Breath sounds: No stridor.  Abdominal:     General: There is no distension.  Skin:    General: Skin is warm and dry.  Neurological:     Mental Status: He is alert and oriented to person, place, and time.  Psychiatric:        Behavior: Behavior is slowed.        Thought Content: Thought content includes suicidal ideation. Thought content does not include suicidal plan.    ED Results / Procedures / Treatments   Labs (all labs ordered are listed, but only abnormal results are displayed) Labs Reviewed  COMPREHENSIVE METABOLIC PANEL - Abnormal; Notable for the following components:      Result Value   Total Protein 8.4 (*)    Total Bilirubin 1.7 (*)    All other components within normal limits  SALICYLATE LEVEL - Abnormal; Notable for the following components:   Salicylate Lvl <7.0 (*)    All other components within normal limits  ACETAMINOPHEN LEVEL - Abnormal; Notable for the following components:   Acetaminophen (Tylenol), Serum <10 (*)    All other components within normal limits  CBC - Abnormal; Notable for the following components:   WBC 11.6 (*)    All other components within normal limits  RAPID URINE DRUG SCREEN, HOSP PERFORMED - Abnormal; Notable for the following components:   Amphetamines POSITIVE (*)    All other components within normal limits  RESP PANEL BY RT-PCR (FLU A&B, COVID) [id number]  ETHANOL     Procedures Procedures    Medications Ordered in ED Medications  hydrOXYzine (ATARAX/VISTARIL) tablet 50 mg (50 mg Oral Given 02/11/21 1443)  loperamide (IMODIUM) capsule 4 mg (4 mg Oral Given 02/11/21 1658)    ED Course  I have reviewed the triage vital signs and the nursing notes.  Pertinent labs & imaging results that were available during my care of the patient were reviewed by me and considered in my medical decision making (see chart  for details).  Young male presents with recent relapse to methamphetamine use and ongoing despondency, now with suicidal ideation.  Patient is awake, alert, has no physical complaints.  Labs reassuring, vitals reassuring, urinalysis consistent with amphetamine positive result on drug screen.  Patient medically cleared for behavioral health evaluation. Final Clinical Impression(s) / ED Diagnoses Final diagnoses:  Suicidal ideation     Gerhard Munch, MD 02/11/21 2016

## 2021-02-11 NOTE — ED Notes (Signed)
Pt eating, provided gingerale per request
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
## 2021-02-11 NOTE — ED Triage Notes (Signed)
Pt here voluntarily with reports of intense suicidal ideation. Denies having a plan in place. Pt states he lost his job recently and relapsed on drugs. Pt calm and cooperative in triage.

## 2021-02-11 NOTE — ED Provider Notes (Signed)
Emergency Medicine Provider Triage Evaluation Note  DAGEN BEEVERS , a 30 y.o. male  was evaluated in triage.  Pt presenting with a complaint of suicidal ideation.  Reports increase of stress at home.  Also relapsed with meth, last use earlier today.  History of depression  Review of Systems  Positive: SI, auditory hallucinations Negative: HI, visual hallucinations  Physical Exam  BP 123/85   Pulse 92   Resp 16   SpO2 100%  Gen:   Awake, no distress   Resp:  Normal effort  MSK:   Moves extremities without difficulty  Other:    Medical Decision Making  Medically screening exam initiated at 12:15 PM.  Appropriate orders placed.  [name] was informed that the remainder of the evaluation will be completed by another provider, this initial triage assessment does not replace that evaluation, and the importance of remaining in the ED until their evaluation is complete.     Woodroe Chen 02/11/21 1251    Gerhard Munch, MD 02/11/21 2014

## 2021-02-12 ENCOUNTER — Ambulatory Visit (HOSPITAL_COMMUNITY)
Admission: EM | Admit: 2021-02-12 | Discharge: 2021-02-12 | Disposition: A | Discharge status: Home / Self Care | Payer: No Payment, Other | Attending: Psychiatry | Admitting: Psychiatry

## 2021-02-12 ENCOUNTER — Encounter (HOSPITAL_COMMUNITY): Payer: Self-pay | Admitting: Oncology

## 2021-02-12 ENCOUNTER — Emergency Department (HOSPITAL_COMMUNITY)
Admission: EM | Admit: 2021-02-12 | Discharge: 2021-02-13 | Disposition: A | Discharge status: Other Acute Inpatient Hospital | Payer: Self-pay | Attending: Emergency Medicine | Admitting: Emergency Medicine

## 2021-02-12 ENCOUNTER — Other Ambulatory Visit: Payer: Self-pay

## 2021-02-12 DIAGNOSIS — F1594 Other stimulant use, unspecified with stimulant-induced mood disorder: Secondary | ICD-10-CM

## 2021-02-12 DIAGNOSIS — Z5900 Homelessness unspecified: Secondary | ICD-10-CM | POA: Insufficient documentation

## 2021-02-12 DIAGNOSIS — F319 Bipolar disorder, unspecified: Secondary | ICD-10-CM | POA: Insufficient documentation

## 2021-02-12 DIAGNOSIS — S50812A Abrasion of left forearm, initial encounter: Secondary | ICD-10-CM | POA: Insufficient documentation

## 2021-02-12 DIAGNOSIS — R45851 Suicidal ideations: Secondary | ICD-10-CM | POA: Insufficient documentation

## 2021-02-12 DIAGNOSIS — W260XXA Contact with knife, initial encounter: Secondary | ICD-10-CM | POA: Insufficient documentation

## 2021-02-12 DIAGNOSIS — F17210 Nicotine dependence, cigarettes, uncomplicated: Secondary | ICD-10-CM | POA: Insufficient documentation

## 2021-02-12 DIAGNOSIS — F4310 Post-traumatic stress disorder, unspecified: Secondary | ICD-10-CM | POA: Insufficient documentation

## 2021-02-12 DIAGNOSIS — F332 Major depressive disorder, recurrent severe without psychotic features: Secondary | ICD-10-CM

## 2021-02-12 DIAGNOSIS — F1520 Other stimulant dependence, uncomplicated: Secondary | ICD-10-CM | POA: Insufficient documentation

## 2021-02-12 DIAGNOSIS — F1510 Other stimulant abuse, uncomplicated: Secondary | ICD-10-CM | POA: Insufficient documentation

## 2021-02-12 DIAGNOSIS — J45909 Unspecified asthma, uncomplicated: Secondary | ICD-10-CM | POA: Insufficient documentation

## 2021-02-12 DIAGNOSIS — Z91040 Latex allergy status: Secondary | ICD-10-CM | POA: Insufficient documentation

## 2021-02-12 MED ORDER — SERTRALINE HCL 50 MG PO TABS
50.0000 mg | ORAL_TABLET | Freq: Every day | ORAL | Status: DC
  Administered 2021-02-12: 50 mg via ORAL
  Filled 2021-02-12: qty 1

## 2021-02-12 MED ORDER — QUETIAPINE FUMARATE 50 MG PO TABS
50.0000 mg | ORAL_TABLET | Freq: Every day | ORAL | Status: DC
  Administered 2021-02-12: 50 mg via ORAL
  Filled 2021-02-12: qty 1

## 2021-02-12 NOTE — Discharge Instructions (Addendum)

## 2021-02-12 NOTE — ED Notes (Signed)
Lunch ordered 
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
## 2021-02-12 NOTE — BH Assessment (Signed)
[facility] Assessment Progress Note   Per Ophelia Shoulder, NP, this pt does not require psychiatric hospitalization at this time.  Pt is psychiatrically cleared.  Discharge instructions include referral information for area substance use disorder treatment providers.  Pt's nurse, Alana, have been notified.  Doylene Canning, MA Triage Specialist [phone]

## 2021-02-12 NOTE — ED Provider Notes (Signed)
[facility]-EMERGENCY DEPT Provider Note   CSN: [id number] Arrival date & time: 02/12/21  1823     History Chief Complaint  Patient presents with   Suicidal    Nathan Koch is a 30 y.o. male.  Pt presents to the ED today with suicidal thoughts.  Pt was seen at [facility] yesterday.  He was kept overnight and then d/c this morning.  He went from [facility] to [facility] and was seen and evaluated.  He was again thought not to require inpatient admission, so he was d/c home.  After he was d/c, he made multiple superficial abrasions to his left forearm and came here saying he is suicidal.         Past Medical History:  Diagnosis Date   Anxiety    Anxiety disorder    Asthma    Bipolar 1 disorder ([name])    Depression    Methamphetamine abuse ([name])    PTSD (post-traumatic stress disorder)     Patient Active Problem List   Diagnosis Date Noted   Severe recurrent major depression without psychotic features ([name]) 01/16/2021   Bipolar 1 disorder, depressed ([name])    MDD (major depressive disorder), recurrent episode, severe ([name]) 09/11/2020   MDD (major depressive disorder), recurrent severe, without psychosis ([name]) 09/10/2020   Depression, major, recurrent, 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   Methamphetamine use disorder, moderate ([name]) 09/14/2019   MDD (major depressive disorder), recurrent, severe, with psychosis ([name]) 09/14/2019   MDD (major depressive disorder), single episode, severe with psychosis ([name]) 09/14/2019   Tobacco use disorder 02/21/2019   PTSD (post-traumatic stress disorder) 02/21/2019   Benzodiazepine abuse ([name]) 11/15/2017   Asthma 06/26/2014    Past Surgical History:  Procedure Laterality Date   CLOSED REDUCTION MANDIBLE N/A 07/02/2018   Procedure: CLOSED REDUCTION MANDIBULAR;  Surgeon: Vernie Murders, MD;  Location: [facility] ORS;  Service: ENT;  Laterality: N/A;       Family History  Problem Relation Age of Onset   Asthma Mother    Cancer Mother         breast cancer   Ulcers Mother    Cancer Father        esophogeal cancer   Hypertension Father    Asthma Brother     Social History   Tobacco Use   Smoking status: Every Day    Packs/day: 0.50    Years: 8.00    Pack years: 4.00    Types: Cigarettes   Smokeless tobacco: Never  Vaping Use   Vaping Use: Every day   Substances: Nicotine, Flavoring  Substance Use Topics   Alcohol use: Not Currently    Alcohol/week: 13.0 standard drinks    Types: 6 Cans of beer, 7 Standard drinks or equivalent per week    Comment: previously heavy drinker 2016. most recent 4 beer/ day drinker and none since 01-11-2020   Drug use: Yes    Types: Methamphetamines    Comment: states he uses 1 g every other day    Home Medications Prior to Admission medications   Medication Sig Start Date End Date Taking? Authorizing Provider  QUEtiapine (SEROQUEL) 50 MG tablet Take 1 tablet (50 mg total) by mouth at bedtime. 02/05/21  Yes Oneta Rack, NP  sertraline (ZOLOFT) 50 MG tablet Take 1 tablet (50 mg total) by mouth at bedtime. 02/05/21  Yes Oneta Rack, NP  acetaminophen (TYLENOL) 500 MG tablet Take 1,000 mg by mouth every 6 (six) hours as needed for moderate  pain or headache. Patient not taking: Reported on 02/12/2021    [provider]  albuterol (VENTOLIN HFA) 108 (90 Base) MCG/ACT inhaler Inhale 2 puffs into the lungs every 4 (four) hours as needed for wheezing or shortness of breath. Patient not taking: Reported on 02/12/2021 01/23/21   Jesse Sans, MD    Allergies    Latex  Review of Systems   Review of Systems  Skin:  Positive for wound.  Psychiatric/Behavioral:  Positive for suicidal ideas.   All other systems reviewed and are negative.  Physical Exam Updated Vital Signs BP 116/73 (BP Location: Left Arm)   Pulse 63   Temp 97.6 F (36.4 C)   Resp 20   SpO2 99%   Physical Exam Vitals and nursing note reviewed.  Constitutional:      Appearance: Normal  appearance.  HENT:     Head: Normocephalic and atraumatic.     Right Ear: External ear normal.     Left Ear: External ear normal.     Nose: Nose normal.     Mouth/Throat:     Mouth: Mucous membranes are moist.     Pharynx: Oropharynx is clear.  Eyes:     Extraocular Movements: Extraocular movements intact.     Conjunctiva/sclera: Conjunctivae normal.     Pupils: Pupils are equal, round, and reactive to light.  Cardiovascular:     Rate and Rhythm: Normal rate and regular rhythm.     Pulses: Normal pulses.     Heart sounds: Normal heart sounds.  Pulmonary:     Effort: Pulmonary effort is normal.     Breath sounds: Normal breath sounds.  Abdominal:     General: Abdomen is flat. Bowel sounds are normal.     Palpations: Abdomen is soft.  Musculoskeletal:        General: Normal range of motion.     Cervical back: Normal range of motion and neck supple.  Skin:    General: Skin is warm.     Capillary Refill: Capillary refill takes less than 2 seconds.     Comments: Multiple superficial abrasions to left forearm  Neurological:     General: No focal deficit present.     Mental Status: He is alert and oriented to person, place, and time.  Psychiatric:        Thought Content: Thought content includes suicidal ideation.    ED Results / Procedures / Treatments   Labs (all labs ordered are listed, but only abnormal results are displayed) Labs Reviewed - No data to display  EKG None  Radiology No results found.  Procedures Procedures   Medications Ordered in ED Medications  sertraline (ZOLOFT) tablet 50 mg (50 mg Oral Given 02/12/21 2154)  QUEtiapine (SEROQUEL) tablet 50 mg (50 mg Oral Given 02/12/21 2154)    ED Course  I have reviewed the triage vital signs and the nursing notes.  Pertinent labs & imaging results that were available during my care of the patient were reviewed by me and considered in my medical decision making (see chart for details).    MDM  Rules/Calculators/A&P                           Labs done yesterday unremarkable.  Urine + amphetamines.  Hx of meth abuse Covid yesterday neg.  I don't see any medical indication to repeat labs.  Pt is medically clear for tts eval.  Final Clinical Impression(s) / ED Diagnoses  Final diagnoses:  Suicidal ideation  Methamphetamine abuse [facility])    Rx / DC Orders ED Discharge Orders     None        Jacalyn Lefevre, MD 02/12/21 2317
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
## 2021-02-12 NOTE — Discharge Instructions (Signed)
To help you maintain a sober lifestyle, a substance use disorder treatment program may be beneficial to you.  Contact one of the following providers at your earliest opportunity to ask about enrolling their program:  RESIDENTIAL REHAB PROGRAMS:       [facility]      [address] [place], [place] [address]      [phone]       [facility]      [address]      [place], [place] [address]      [phone]       [facility]      [address]      [place], [place] [address]      [phone]       [facility]      [address]      [place], [place] [address]      [phone]  CHARITABLE RESIDENTIAL PROGRAMS:       [facility]      [address], [place] [address]      [phone]       [facility]      [phone] [address] [place] [address]      [phone] II      [address]      [place], [place] [address]      [phone], [place] [address]      [phone]       [facility]      [address] [place], [place] [address]      [phone]

## 2021-02-12 NOTE — ED Notes (Signed)
Breakfast Order placed ?

## 2021-02-12 NOTE — ED Notes (Signed)
Pt discharged in no acute distress. Verbalized understanding of instructions reviewed on AVS. Safety maintained. 

## 2021-02-12 NOTE — Consult Note (Signed)
Telepsych Consultation   Reason for Consult:  Psychiatric Reassessment Referring Physician:  EDP Location of Patient:    [facility] Location of Provider: Other: virtual home office  Patient Identification: Nathan Koch MRN:  [id number] Principal Diagnosis: Amphetamine-induced mood disorder ([name]) Diagnosis:  Principal Problem:   Amphetamine-induced mood disorder ([name]) Active Problems:   Methamphetamine use disorder, moderate ([name])   Severe recurrent major depression without psychotic features ([name])   Total Time spent with patient: 30 minutes  Subjective:   Nathan Koch is a 30 y.o. male patient admitted with suicidal ideations, recommended for 24 obs and AM psych reassessment.  Today patient states, "I am not going to hurt myself but I do not have a place to go."  Patient seen via telepsych by this provider; chart reviewed and consulted with Dr. Lucianne Muss on 03/04/21.  On evaluation Nathan Koch who endorses audio hallucinations, command in nature that began after using methamphetamine yesterday.  He has a hx for substance abuse, usually has drug induced psychosis which improves when he stops using illict drugs.  Since admission yesterday, he was restarted on is psychiatric medications, sertraline 50mg  po BID and Quetiapine 50mg  po daily and monitored for safety.  Today he has passive suicidal ideations, but no plan or intent.  States he is remorseful because of recent relapse, currently lives at [facility] and aware he will have to leave when they learn of his relapse.    He has been seen at [facility] several times within the past week with similar presentation.  Was psych cleared and connected with outpatient resources.  Appears he has not followed up.    HPI:  Per EDP Admission Assessment 02/11/2021: Chief Complaint  Patient presents with   Suicidal      Nathan Koch is a 30 y.o. male.   HPI With a history of anxiety, bipolar, depression presents with suicidal ideation.   He denies pain.  He notes that he relapsed earlier today.  He began using methamphetamine, snorting it.  Last use was almost 1 month ago.  He notes that there is marital discord due to his substance abuse.  He notes that he feels despondent after having relapse, with suicidal ideation without a discrete plan.  Though he reported hallucinations to a colleague, he does not report this to me.  Since onset of a suicidal ideation no clear alleviating or exacerbating factors.  Past Psychiatric History: polysubstance abuse,   Risk to Self:  no Risk to Others:  no Prior Inpatient Therapy: yes  Prior Outpatient Therapy:  yes  Past Medical History:  Past Medical History:  Diagnosis Date   Anxiety    Anxiety disorder    Asthma    Bipolar 1 disorder ([name])    Depression    Methamphetamine abuse ([name])    PTSD (post-traumatic stress disorder)     Past Surgical History:  Procedure Laterality Date   CLOSED REDUCTION MANDIBLE N/A 07/02/2018   Procedure: CLOSED REDUCTION MANDIBULAR;  Surgeon: 38, MD;  Location: [facility] ORS;  Service: ENT;  Laterality: N/A;   Family History:  Family History  Problem Relation Age of Onset   Asthma Mother    Cancer Mother        breast cancer   Ulcers Mother    Cancer Father        esophogeal cancer   Hypertension Father    Asthma Brother    Family Psychiatric  History: unknown Social History:  Social History  Substance and Sexual Activity  Alcohol Use Not Currently   Alcohol/week: 13.0 standard drinks   Types: 6 Cans of beer, 7 Standard drinks or equivalent per week   Comment: previously heavy drinker 2016. most recent 4 beer/ day drinker and none since 01-11-2020     Social History   Substance and Sexual Activity  Drug Use Yes   Types: Methamphetamines   Comment: states he uses 1 g every other day    Social History   Socioeconomic History   Marital status: Single    Spouse name: Not on file   Number of children: 1   Years of education:  10   Highest education level: 10th grade  Occupational History   Not on file  Tobacco Use   Smoking status: Every Day    Packs/day: 0.50    Years: 8.00    Pack years: 4.00    Types: Cigarettes   Smokeless tobacco: Never  Vaping Use   Vaping Use: Every day   Substances: Nicotine, Flavoring  Substance and Sexual Activity   Alcohol use: Not Currently    Alcohol/week: 13.0 standard drinks    Types: 6 Cans of beer, 7 Standard drinks or equivalent per week    Comment: previously heavy drinker 2016. most recent 4 beer/ day drinker and none since 01-11-2020   Drug use: Yes    Types: Methamphetamines    Comment: states he uses 1 g every other day   Sexual activity: Not on file  Other Topics Concern   Not on file  Social History Narrative   Not on file   Social Determinants of Health   Financial Resource Strain: Not on file  Food Insecurity: Not on file  Transportation Needs: Not on file  Physical Activity: Not on file  Stress: Not on file  Social Connections: Not on file   Additional Social History:    Allergies:   Allergies  Allergen Reactions   Latex Hives    Labs:  No results found for this or any previous visit (from the past 48 hour(s)).   Medications:  No current facility-administered medications for this encounter.   Current Outpatient Medications  Medication Sig Dispense Refill   acetaminophen (TYLENOL) 500 MG tablet Take 1,000 mg by mouth every 6 (six) hours as needed for moderate pain or headache. (Patient not taking: Reported on 02/12/2021)     albuterol (VENTOLIN HFA) 108 (90 Base) MCG/ACT inhaler Inhale 2 puffs into the lungs every 4 (four) hours as needed for wheezing or shortness of breath. (Patient not taking: Reported on 02/12/2021) 6.7 g 1   QUEtiapine (SEROQUEL) 50 MG tablet Take 1 tablet (50 mg total) by mouth at bedtime. 30 tablet 0   sertraline (ZOLOFT) 50 MG tablet Take 1 tablet (50 mg total) by mouth at bedtime. 7 tablet 0     Musculoskeletal: Strength & Muscle Tone: within normal limits Gait & Station: normal Patient leans: N/A  Psychiatric Specialty Exam:  Presentation  General Appearance: Appropriate for Environment; Fairly Groomed; Surveyor, minerals and Coherent; Normal Rate  Speech Volume:Normal  Handedness:Right   Mood and Affect  Mood:Anxious  Affect:Congruent   Thought Process  Thought Processes:Coherent  Descriptions of Associations:Intact  Orientation:Full (Time, Place and Person)  Thought Content:Logical  History of Schizophrenia/Schizoaffective disorder:No  Duration of Psychotic Symptoms:N/A  Hallucinations:No data recorded  Ideas of Reference:None  Suicidal Thoughts:No data recorded  Homicidal Thoughts:No data recorded  Sensorium  Memory:Immediate Good; Recent Good; Remote Good  Judgment:Fair  Insight:Fair   Executive Functions  Concentration:Good  Attention Span:Good  Recall:Good  Fund of Knowledge:Good  Language:Good   Psychomotor Activity  Psychomotor Activity:No data recorded   Assets  Assets:Communication Skills; Desire for Improvement; Physical Health   Sleep  Sleep:No data recorded    Physical Exam: Physical Exam Constitutional:      Appearance: Normal appearance.  HENT:     Head: Normocephalic.  Cardiovascular:     Rate and Rhythm: Normal rate.     Pulses: Normal pulses.  Pulmonary:     Effort: Pulmonary effort is normal.  Musculoskeletal:        General: Normal range of motion.     Cervical back: Normal range of motion.  Neurological:     General: No focal deficit present.     Mental Status: He is alert and oriented to person, place, and time.  Psychiatric:        Attention and Perception: Attention and perception normal.        Mood and Affect: Mood and affect normal.        Speech: Speech normal.        Behavior: Behavior normal. Behavior is cooperative.        Thought Content: Thought  content normal.        Cognition and Memory: Cognition and memory normal.        Judgment: Judgment is impulsive.   Review of Systems  Constitutional: Negative.   HENT: Negative.    Eyes: Negative.   Respiratory: Negative.    Cardiovascular: Negative.   Gastrointestinal: Negative.   Genitourinary: Negative.   Musculoskeletal: Negative.   Skin: Negative.   Neurological: Negative.   Endo/Heme/Allergies: Negative.   Blood pressure (!) 118/59, pulse (!) 109, temperature 97.8 F (36.6 C), temperature source Oral, resp. rate 18, SpO2 97 %. There is no height or weight on file to calculate BMI.  Treatment Plan Summary: Plan- As per above assessment, there are no current grounds for involuntary commitment at this time.  Patient is future oriented and verbalizes his plan to seek treatment for SUD.  States his primary concern as homelessness. I have asked SW to provide homeless resources in patient's AVS.  He should continue his psychiatric medications and follow-up with outpatient provider for medication management.   Patient is not currently interested in inpatient services but expresses agreement to continue outpatient treatment., we have reviewed importance of substance abuse abstinence, potential negative impact substance abuse can have on his relationships and level of functioning, and importance of medication compliance.  Disposition: No evidence of imminent risk to self or others at present.   Patient does not meet criteria for psychiatric inpatient admission. Supportive therapy provided about ongoing stressors. Discussed crisis plan, support from social network, calling 911, coming to the Emergency Department, and calling Suicide Hotline.  This service was provided via telemedicine using a 2-way, interactive audio and video technology.  Names of all persons participating in this telemedicine service and their role in this encounter. Name: Nathan Koch Role: Patient  Name: Ophelia Shoulder Role: PMHNP    Chales Abrahams, NP 03/04/2021 10:20 AM

## 2021-02-12 NOTE — ED Provider Notes (Signed)
Behavioral Health Urgent Care Medical Screening Exam  Patient Name: Nathan Koch MRN: [id number] Date of Evaluation: 02/12/21 Chief Complaint:   Diagnosis:  Final diagnoses:  Methamphetamine use disorder, moderate ([name])  Severe recurrent major depression without psychotic features ([name])    History of Present illness: Nathan Koch is a 30 y.o. male.  Patient presents voluntarily to [facility] behavioral health for walk-in assessment.  He was discharged from emergency department earlier today however reports frustration he did not have transportation to seek resources.  Dilan indicates current stressors include a recent relapse after 25 days clean when a peer at an [place] sober living house encouraged him to use methamphetamine.  Last use of methamphetamine on yesterday.  Patient is assessed face-to-face by nurse practitioner.   He is seated in assessment area, no acute distress.  He is alert and oriented, pleasant and cooperative during assessment.  He endorses passive suicidal ideation, denies plan or intent related to current situation.  He denies homicidal ideation.  He contracts verbally for safety with this Clinical research associate.   He has normal speech and behavior.  He denies both auditory and visual hallucinations.  Patient is able to converse coherently with goal-directed thoughts and no distractibility or preoccupation.  He denies paranoia.  Objectively there is no evidence of psychosis/mania or delusional thinking.  Jahlon has been diagnosed with methamphetamine use disorder, major depressive disorder, bipolar disorder and PTSD.  He reports he is not currently linked to outpatient psychiatry.  He states "I usually get my Zoloft and Seroquel when I am in patient at hospitals."  He is currently homeless and would like to return to [employer] [facility].  He reports his ultimate plan is to "go to [facility] in a few weeks." He is currently not employed, denies access to weapons.  He endorses  average sleep and appetite.  He denies alcohol use, denies substance use aside from methamphetamine.  Patient offered support and encouragement, he declines any person to contact for collateral information at this time.   Psychiatric Specialty Exam  Presentation  General Appearance:Appropriate for Environment; Casual  Eye Contact:Good  Speech:Clear and Coherent; Normal Rate  Speech Volume:Normal  Handedness:Right   Mood and Affect  Mood:Euthymic  Affect:Appropriate; Congruent   Thought Process  Thought Processes:Coherent; Goal Directed; Linear  Descriptions of Associations:Intact  Orientation:Full (Time, Place and Person)  Thought Content:Logical; WDL  Diagnosis of Schizophrenia or Schizoaffective disorder in past: No   Hallucinations:None yesterday, none today.  Ideas of Reference:None  Suicidal Thoughts:Yes, Passive Without Intent; Without Plan Without Intent; Without Plan  Homicidal Thoughts:No   Sensorium  Memory:Immediate Good; Recent Good; Remote Good  Judgment:Good  Insight:Fair   Executive Functions  Concentration:Good  Attention Span:Good  Recall:Good  Fund of Knowledge:Good  Language:Good   Psychomotor Activity  Psychomotor Activity:Normal   Assets  Assets:Communication Skills; Desire for Improvement; Financial Resources/Insurance; Intimacy; Leisure Time; Physical Health; Resilience   Sleep  Sleep:Good  Number of hours: 8   No data recorded  Physical Exam: Physical Exam Vitals and nursing note reviewed.  Constitutional:      Appearance: Normal appearance. He is well-developed.  HENT:     Head: Normocephalic and atraumatic.     Nose: Nose normal.  Cardiovascular:     Rate and Rhythm: Normal rate.  Pulmonary:     Effort: Pulmonary effort is normal.  Musculoskeletal:        General: Normal range of motion.     Cervical back: Normal range of motion.  Skin:  General: Skin is warm and dry.  Neurological:      Mental Status: He is alert and oriented to person, place, and time.  Psychiatric:        Attention and Perception: Attention and perception normal.        Mood and Affect: Mood and affect normal.        Speech: Speech normal.        Behavior: Behavior normal. Behavior is cooperative.        Thought Content: Thought content normal.        Cognition and Memory: Cognition and memory normal.        Judgment: Judgment normal.   Review of Systems  Constitutional: Negative.   HENT: Negative.    Eyes: Negative.   Respiratory: Negative.    Cardiovascular: Negative.   Gastrointestinal: Negative.   Genitourinary: Negative.   Musculoskeletal: Negative.   Skin: Negative.   Neurological: Negative.   Endo/Heme/Allergies: Negative.   Psychiatric/Behavioral:  Positive for substance abuse.   Blood pressure 107/69, pulse 68, temperature 98 F (36.7 C), temperature source Oral, resp. rate 16, SpO2 100 %. There is no height or weight on file to calculate BMI.  Musculoskeletal: Strength & Muscle Tone: within normal limits Gait & Station: normal Patient leans: N/A   [facility] MSE Discharge Disposition for Follow up and Recommendations: Based on my evaluation the patient does not appear to have an emergency medical condition and can be discharged with resources and follow up care in outpatient services for Medication Management and Individual Therapy Patient reviewed with Dr. Bronwen Betters. Follow-up with outpatient psychiatry, resources provided. Follow-up with substance use treatment resources provided.   Lenard Lance, FNP 02/12/2021, 3:23 PM

## 2021-02-12 NOTE — ED Triage Notes (Signed)
Pt presents d/t suicidal ideation w/ plan to cut self.  Pt reports being a recovering addict and used last night which is what lead to SI. Pt seen earlier at [facility] and psych cleared.

## 2021-02-13 ENCOUNTER — Ambulatory Visit (HOSPITAL_COMMUNITY)
Admission: EM | Admit: 2021-02-13 | Discharge: 2021-02-14 | Disposition: A | Discharge status: Home / Self Care | Payer: No Payment, Other | Attending: Behavioral Health | Admitting: Behavioral Health

## 2021-02-13 ENCOUNTER — Telehealth (HOSPITAL_COMMUNITY): Payer: Self-pay

## 2021-02-13 DIAGNOSIS — Z5900 Homelessness unspecified: Secondary | ICD-10-CM | POA: Insufficient documentation

## 2021-02-13 DIAGNOSIS — F1994 Other psychoactive substance use, unspecified with psychoactive substance-induced mood disorder: Secondary | ICD-10-CM

## 2021-02-13 DIAGNOSIS — F17210 Nicotine dependence, cigarettes, uncomplicated: Secondary | ICD-10-CM | POA: Insufficient documentation

## 2021-02-13 DIAGNOSIS — Z20822 Contact with and (suspected) exposure to covid-19: Secondary | ICD-10-CM | POA: Insufficient documentation

## 2021-02-13 DIAGNOSIS — F1924 Other psychoactive substance dependence with psychoactive substance-induced mood disorder: Secondary | ICD-10-CM | POA: Insufficient documentation

## 2021-02-13 DIAGNOSIS — F1520 Other stimulant dependence, uncomplicated: Secondary | ICD-10-CM

## 2021-02-13 DIAGNOSIS — R45851 Suicidal ideations: Secondary | ICD-10-CM | POA: Insufficient documentation

## 2021-02-13 DIAGNOSIS — F1524 Other stimulant dependence with stimulant-induced mood disorder: Secondary | ICD-10-CM | POA: Insufficient documentation

## 2021-02-13 LAB — POCT URINE DRUG SCREEN - MANUAL ENTRY (I-SCREEN)
POC Amphetamine UR: POSITIVE — AB
POC Buprenorphine (BUP): NOT DETECTED
POC Cocaine UR: NOT DETECTED
POC Marijuana UR: NOT DETECTED
POC Methadone UR: NOT DETECTED
POC Methamphetamine UR: POSITIVE — AB
POC Morphine: NOT DETECTED
POC Oxazepam (BZO): NOT DETECTED
POC Oxycodone UR: NOT DETECTED
POC Secobarbital (BAR): NOT DETECTED

## 2021-02-13 LAB — RESP PANEL BY RT-PCR (FLU A&B, COVID) ARPGX2
Influenza A by PCR: NEGATIVE
Influenza B by PCR: NEGATIVE
SARS Coronavirus 2 by RT PCR: NEGATIVE

## 2021-02-13 LAB — ETHANOL: Alcohol, Ethyl (B): <10 mg/dL (ref <10)

## 2021-02-13 LAB — POC SARS CORONAVIRUS 2 AG: SARSCOV2ONAVIRUS 2 AG: NEGATIVE

## 2021-02-13 MED ORDER — SERTRALINE HCL 50 MG PO TABS
50.0000 mg | ORAL_TABLET | Freq: Every day | ORAL | Status: DC
  Administered 2021-02-13 – 2021-02-14 (×2): 50 mg via ORAL
  Filled 2021-02-13 (×2): qty 1

## 2021-02-13 MED ORDER — TRAZODONE HCL 50 MG PO TABS
50.0000 mg | ORAL_TABLET | Freq: Every evening | ORAL | Status: DC | PRN
  Administered 2021-02-13: 50 mg via ORAL
  Filled 2021-02-13: qty 1

## 2021-02-13 MED ORDER — ACETAMINOPHEN 325 MG PO TABS: 650.0000 mg | ORAL_TABLET | Freq: Four times a day (QID) | ORAL | Status: DC | PRN

## 2021-02-13 MED ORDER — HYDROXYZINE HCL 25 MG PO TABS
25.0000 mg | ORAL_TABLET | Freq: Three times a day (TID) | ORAL | Status: DC | PRN
  Administered 2021-02-13: 25 mg via ORAL
  Filled 2021-02-13: qty 1

## 2021-02-13 MED ORDER — MAGNESIUM HYDROXIDE 400 MG/5ML PO SUSP: 30.0000 mL | Freq: Every day | ORAL | Status: DC | PRN

## 2021-02-13 MED ORDER — QUETIAPINE FUMARATE 50 MG PO TABS
50.0000 mg | ORAL_TABLET | Freq: Every day | ORAL | Status: DC
  Administered 2021-02-13: 50 mg via ORAL
  Filled 2021-02-13: qty 1

## 2021-02-13 MED ORDER — ALBUTEROL SULFATE HFA 108 (90 BASE) MCG/ACT IN AERS
2.0000 | INHALATION_SPRAY | RESPIRATORY_TRACT | Status: DC | PRN
  Administered 2021-02-13: 2 via RESPIRATORY_TRACT
  Filled 2021-02-13: qty 6.7

## 2021-02-13 MED ORDER — ALUM & MAG HYDROXIDE-SIMETH 200-200-20 MG/5ML PO SUSP: 30.0000 mL | ORAL | Status: DC | PRN

## 2021-02-13 NOTE — Progress Notes (Addendum)
Patient's EKG reads normal sinus rhythm, possible acute pericarditis, abnormal EKG. This provider consulted with Dr.Dykstra regarding abnormal EKG. Dr. Stevie Kern reviewed the pt's chart and EKG with this provider. Dr.Dykstra states that the EKG appears as the previous EKG week ago, showing early repolarization, is benign and unchanged.

## 2021-02-13 NOTE — BH Assessment (Addendum)
Nathan Koch is a 30 year old patient who was d/c from the the Behavioral Health Urgent Care [facility]) at 1627 and received an order for a MH Assessment through TTS at [facility] at 1830. Pt was psych cleared by Doran Heater, NP at the [facility] with recommendations to f/u with mental health/SA resources, which were provided.  Pt informs this clinician that he relapsed on methamphetamine on Monday, October 17; he states he no longer wants to live in [place]. Pt recognizes, and states he wants, services but states those services aren't working for him at this time. Pt states he has been accepted at [facility] and will have a bed in 3 weeks.  Pt states, "I relapsed on Monday on 1 gram of meth." Pt reports he is experiencing detox symptoms including, but not limited to, diarrhea, an upset stomach, and a reduction in sleep. Pt endorses SI with a plan to cut himself; he states he engaged in NSSIB via cutting with a knife today. Pt denies HI, VH, access to guns/weapons, engagement in the legal system, or the use of symptoms other than methamphetamine. Pt endorses experiencing HI.  Pt is oriented x5. His recent/remote memory is intact. Pt was cooperative throughout the assessment process. Pt's insight, judgement, and impulse control is poor at this time.  Nira Conn, NP, reviewed pt's chart and information and determined pt should receive continuous assessment at [facility] and be re-assessed in the morning by psych. This message was relayed to pt's team at 0436.

## 2021-02-13 NOTE — ED Provider Notes (Signed)
Patient accepted at [facility] by Nira Conn NP.   Glynn Octave, MD 02/13/21 [phone]

## 2021-02-13 NOTE — ED Notes (Addendum)
Pt eating dinner in no acute distress. Safety maintained. 

## 2021-02-13 NOTE — ED Provider Notes (Signed)
Behavioral Health Admission H&P [facility])  Date: 02/13/21 Patient Name: Nathan Koch MRN: [id number] Chief Complaint: No chief complaint on file.     Diagnoses:  Final diagnoses:  Substance induced mood disorder ([name])  Methamphetamine use disorder, moderate ([name])    HPI: Nathan Koch is a 30 year old patient who was d/c [facility] the the [facility] at 1627 on 02/12/21.Pt was psych cleared by Doran Heater, NP at the [facility] with recommendations to f/u with mental health/SA resources, which were provided. Patient presented to [facility] on 02/12/21 with a similar presentation and recommended for continuous assessment at the [facility].   Per chart review: Pt presents to the ED today with suicidal thoughts. Pt was seen at [facility] yesterday. He was kept overnight and then d/c this morning. He went from [facility] to [facility] and was seen and evaluated. He was again thought not to require inpatient admission, so he was d/c home. After he was d/c, he made multiple superficial abrasions to his left forearm and came here saying he is suicidal.     Patient seen and examined face to face by this provider, chart reviewed and case discussed with Dr. Lucianne Muss. On evaluation, patient is alert and oriented x4. His thought process is logical and speech is coherent. His mood is dysphoric and affect is congruent. He endorses suicidal ideations with a plan to start stabbing himself until he cuts something important. He is unable to verbally contract for safety at this time. He denies having thoughts of wanting to hurt others.He reports hearing voices that started last night. He describes the voices as telling him that he is "worthless and a piece of shit." He denies visual hallucinations. He does not appear to be responding to internal or external stimuli. He reports feeling depressed for the past 30 days and describes his symptoms as sadness, worthlessness, hopelessness, decreased energy level, crying spells, irritability,  and feeling guilty. He states that nothing makes his depression better or worse. He reports feeling a little anxious and describes his anxiety intermittent body shakes, heart beating fast, and racing thoughts. He reports good sleep. He reports having a good appetite. He reports relapsing on meth yesterday after being sober for 25 days. He reports using 1 g of meth yesterday. He reports using meth intermittently for the past 4 years total. He denies drinking alcohol or using other illicit drugs. He reports an upcoming court date on November 15 for carrying a concealed weapon. He reports that when he was discharged from [facility] recently he was supposed to follow up with [facility] but did not have time to follow up because he was working. He states that he was prescribed Seroquel 50 mg at bedtime and Zoloft 50 mg grams daily for his mood.  He reports that he went to live at an [facility] house after discharge but was kicked out because he started using drugs. He reports that he is interested in substance abuse treatment and has a bed at [facility] in [place] in 3 weeks. He reports a medical history of asthma. He is currently homeless. He is noted to have self inflicted superficial cuts to left forearm.  Per chart review: Admission at [facility] 01/16/21-01/25/21: Patient seen and chart reviewed. 30 year old man with a history of recurrent depression and substance abuse came to the hospital reporting suicidal ideation.  He tells me his mood has been bad for a couple of months. He had been staying sober in a long-term program for  most of a year but then dropped out. Since then has been on the street for the most part. Family not able to help him. Using drugs intermittently. Usually not taking medication.  Feeling depressed and negative most of the time. For the last few days he has been having active suicidal thoughts. Cut himself on the forearm and required some repair in the emergency room.  Patient denied to me that he was using any drugs but his drug screen is positive for amphetamines which is usually his drug of choice. He tells me that he still occasionally takes Seroquel and Zoloft but it sounds like it is pretty intermittent. He denies any auditory or visual hallucinations. Denies homicidal ideation.     PHQ 2-9:  Flowsheet Row ED from 02/05/2021 in [facility] ED from 01/14/2021 in [facility] ED from 11/10/2020 in [facility] EMERGENCY DEPARTMENT  Thoughts that you would be better off dead, or of hurting yourself in some way More than half the days More than half the days More than half the days  PHQ-9 Total Score 9 6 8        Flowsheet Row ED from 02/13/2021 in [facility] ED from 02/12/2021 in [facility] ED from 02/11/2021 in [facility] EMERGENCY DEPARTMENT  C-SSRS RISK CATEGORY High Risk High Risk High Risk        Total Time spent with patient: 30 minutes  Musculoskeletal  Strength & Muscle Tone: within normal limits Gait & Station: normal Patient leans: N/A  Psychiatric Specialty Exam  Presentation General Appearance: Appropriate for Environment  Eye Contact:Fair  Speech:Clear and Coherent  Speech Volume:Normal  Handedness:Right   Mood and Affect  Mood:Depressed  Affect:Congruent   Thought Process  Thought Processes:Coherent; Goal Directed  Descriptions of Associations:Intact  Orientation:Full (Time, Place and Person)  Thought Content:Logical  Diagnosis of Schizophrenia or Schizoaffective disorder in past: No   Hallucinations:Hallucinations: Auditory  Ideas of Reference:None  Suicidal Thoughts:Suicidal Thoughts: Yes, Active SI Active Intent and/or Plan: With Plan SI Passive Intent and/or Plan: Without Intent; Without Plan  Homicidal Thoughts:Homicidal Thoughts: No   Sensorium  Memory:Immediate Fair;  Recent Fair; Remote Fair  Judgment:Fair  Insight:Fair   Executive Functions  Concentration:Fair  Attention Span:Fair  Recall:Fair  Fund of Knowledge:Fair  Language:Fair   Psychomotor Activity  Psychomotor Activity:Psychomotor Activity: Normal   Assets  Assets:Communication Skills; Desire for Improvement; Physical Health; Leisure Time   Sleep  Sleep:Sleep: Good Number of Hours of Sleep: 5   No data recorded  Physical Exam Constitutional:      Appearance: Normal appearance.  HENT:     Head: Atraumatic.     Nose: Nose normal.  Eyes:     Conjunctiva/sclera: Conjunctivae normal.  Cardiovascular:     Rate and Rhythm: Normal rate.  Pulmonary:     Effort: Pulmonary effort is normal.  Musculoskeletal:     Cervical back: Normal range of motion.  Skin:    Comments: Self inflicted superficial cuts to left forearm.  Neurological:     Mental Status: He is alert and oriented to person, place, and time.   Review of Systems  Constitutional: Negative.   HENT: Negative.    Eyes: Negative.   Respiratory: Negative.    Cardiovascular: Negative.   Gastrointestinal: Negative.   Genitourinary: Negative.   Musculoskeletal: Negative.   Neurological: Negative.   Endo/Heme/Allergies: Negative.   Psychiatric/Behavioral:  Positive for depression, hallucinations, substance abuse and suicidal ideas.  The patient is nervous/anxious.    Blood pressure 102/65, pulse 65, temperature 97.9 F (36.6 C), resp. rate 16, SpO2 96 %. There is no height or weight on file to calculate BMI.  Past Psychiatric History: Patient has a documented psychiatric history of methamphetamine use disorder, MDD, bipolar 1 disorder, depressed, PTSD, and benzo abuse.  Is the patient at risk to self? Yes  Has the patient been a risk to self in the past 6 months? Yes .    Has the patient been a risk to self within the distant past? No   Is the patient a risk to others? No   Has the patient been a risk to  others in the past 6 months? No   Has the patient been a risk to others within the distant past? No   Past Medical History:  Past Medical History:  Diagnosis Date   Anxiety    Anxiety disorder    Asthma    Bipolar 1 disorder ([name])    Depression    Methamphetamine abuse ([name])    PTSD (post-traumatic stress disorder)     Past Surgical History:  Procedure Laterality Date   CLOSED REDUCTION MANDIBLE N/A 07/02/2018   Procedure: CLOSED REDUCTION MANDIBULAR;  Surgeon: Vernie Murders, MD;  Location: [facility] ORS;  Service: ENT;  Laterality: N/A;    Family History:  Family History  Problem Relation Age of Onset   Asthma Mother    Cancer Mother        breast cancer   Ulcers Mother    Cancer Father        esophogeal cancer   Hypertension Father    Asthma Brother     Social History:  Social History   Socioeconomic History   Marital status: Single    Spouse name: Not on file   Number of children: 1   Years of education: 10   Highest education level: 10th grade  Occupational History   Not on file  Tobacco Use   Smoking status: Every Day    Packs/day: 0.50    Years: 8.00    Pack years: 4.00    Types: Cigarettes   Smokeless tobacco: Never  Vaping Use   Vaping Use: Every day   Substances: Nicotine, Flavoring  Substance and Sexual Activity   Alcohol use: Not Currently    Alcohol/week: 13.0 standard drinks    Types: 6 Cans of beer, 7 Standard drinks or equivalent per week    Comment: previously heavy drinker 2016. most recent 4 beer/ day drinker and none since 01-11-2020   Drug use: Yes    Types: Methamphetamines    Comment: states he uses 1 g every other day   Sexual activity: Not on file  Other Topics Concern   Not on file  Social History Narrative   Not on file   Social Determinants of Health   Financial Resource Strain: Not on file  Food Insecurity: Not on file  Transportation Needs: Not on file  Physical Activity: Not on file  Stress: Not on file  Social  Connections: Not on file  Intimate Partner Violence: Not on file    SDOH:  SDOH Screenings   Alcohol Screen: Low Risk    Last Alcohol Screening Score (AUDIT): 1  Depression (PHQ2-9): Medium Risk   PHQ-2 Score: 9  Financial Resource Strain: Not on file  Food Insecurity: Not on file  Housing: Not on file  Physical Activity: Not on file  Social Connections: Not on file  Stress: Not on file  Tobacco Use: High Risk   Smoking Tobacco Use: Every Day   Smokeless Tobacco Use: Never   Passive Exposure: Not on file  Transportation Needs: Not on file    Last Labs:  Admission on 02/13/2021  Component Date Value Ref Range Status   Alcohol, Ethyl (B) 02/13/2021 <10  <10 mg/dL Final   Comment: (NOTE) Lowest detectable limit for serum alcohol is 10 mg/dL.  For medical purposes only. Performed at  [facility], [address], [place], [place] [address]    POC Amphetamine UR 02/13/2021 Positive (A)  NONE DETECTED (Cut Off Level 1000 ng/mL) Final   POC Secobarbital (BAR) 02/13/2021 None Detected  NONE DETECTED (Cut Off Level 300 ng/mL) Final   POC Buprenorphine (BUP) 02/13/2021 None Detected  NONE DETECTED (Cut Off Level 10 ng/mL) Final   POC Oxazepam (BZO) 02/13/2021 None Detected  NONE DETECTED (Cut Off Level 300 ng/mL) Final   POC Cocaine UR 02/13/2021 None Detected  NONE DETECTED (Cut Off Level 300 ng/mL) Final   POC Methamphetamine UR 02/13/2021 Positive (A)  NONE DETECTED (Cut Off Level 1000 ng/mL) Final   POC Morphine 02/13/2021 None Detected  NONE DETECTED (Cut Off Level 300 ng/mL) Final   POC Oxycodone UR 02/13/2021 None Detected  NONE DETECTED (Cut Off Level 100 ng/mL) Final   POC Methadone UR 02/13/2021 None Detected  NONE DETECTED (Cut Off Level 300 ng/mL) Final   POC Marijuana UR 02/13/2021 None Detected  NONE DETECTED (Cut Off Level 50 ng/mL) Final   SARSCOV2ONAVIRUS 2 AG 02/13/2021 NEGATIVE  NEGATIVE Final   Comment: (NOTE) SARS-CoV-2 antigen NOT DETECTED.   Negative  results are presumptive.  Negative results do not preclude SARS-CoV-2 infection and should not be used as the sole basis for treatment or other patient management decisions, including infection  control decisions, particularly in the presence of clinical signs and  symptoms consistent with COVID-19, or in those who have been in contact with the virus.  Negative results must be combined with clinical observations, patient history, and epidemiological information. The expected result is Negative.  Fact Sheet for Patients: https://www.jennings-kim.com/  Fact Sheet for Healthcare Providers: https://alexander-rogers.biz/  This test is not yet approved or cleared by the [place] FDA and  has been authorized for detection and/or diagnosis of SARS-CoV-2 by FDA under an Emergency Use Authorization (EUA).  This EUA will remain in effect (meaning this test can be used) for the duration of  the COV                          ID-19 declaration under Section 564(b)(1) of the Act, 21 U.S.C. section 360bbb-3(b)(1), unless the authorization is terminated or revoked sooner.    Admission on 02/11/2021, Discharged on 02/12/2021  Component Date Value Ref Range Status   Sodium 02/11/2021 140  135 - 145 mmol/L Final   Potassium 02/11/2021 4.1  3.5 - 5.1 mmol/L Final   Chloride 02/11/2021 103  98 - 111 mmol/L Final   CO2 02/11/2021 24  22 - 32 mmol/L Final   Glucose, Bld 02/11/2021 89  70 - 99 mg/dL Final   Glucose reference range applies only to samples taken after fasting for at least 8 hours.   BUN 02/11/2021 12  6 - 20 mg/dL Final   Creatinine, Ser 02/11/2021 0.89  0.61 - 1.24 mg/dL Final   Calcium 60/45/4098 10.2  8.9 - 10.3 mg/dL Final   Total Protein 11/91/4782 8.4 (  A)  6.5 - 8.1 g/dL Final   Albumin 16/01/9603 4.6  3.5 - 5.0 g/dL Final   AST 54/12/8117 40  15 - 41 U/L Final   ALT 02/11/2021 19  0 - 44 U/L Final   Alkaline Phosphatase 02/11/2021 83  38 - 126 U/L Final    Total Bilirubin 02/11/2021 1.7 (A)  0.3 - 1.2 mg/dL Final   GFR, Estimated 02/11/2021 >60  >60 mL/min Final   Comment: (NOTE) Calculated using the CKD-EPI Creatinine Equation (2021)    Anion gap 02/11/2021 13  5 - 15 Final   Performed at [facility], [address], [place], [place] [address]   Alcohol, Ethyl (B) 02/11/2021 <10  <10 mg/dL Final   Comment: (NOTE) Lowest detectable limit for serum alcohol is 10 mg/dL.  For medical purposes only. Performed at [facility], [address], [place], [place] [address]    Salicylate Lvl 02/11/2021 <7.0 (A)  7.0 - 30.0 mg/dL Final   Performed at [facility], [address], [place], [place] [address]   Acetaminophen (Tylenol), Serum 02/11/2021 <10 (A)  10 - 30 ug/mL Final   Comment: (NOTE) Therapeutic concentrations vary significantly. A range of 10-30 ug/mL  may be an effective concentration for many patients. However, some  are best treated at concentrations outside of this range. Acetaminophen concentrations >150 ug/mL at 4 hours after ingestion  and >50 ug/mL at 12 hours after ingestion are often associated with  toxic reactions.  Performed at [facility], [address], [place], [place] [address]    WBC 02/11/2021 11.6 (A)  4.0 - 10.5 K/uL Final   RBC 02/11/2021 5.47  4.22 - 5.81 MIL/uL Final   Hemoglobin 02/11/2021 15.5  13.0 - 17.0 g/dL Final   HCT 62/95/2841 46.5  39.0 - 52.0 % Final   MCV 02/11/2021 85.0  80.0 - 100.0 fL Final   MCH 02/11/2021 28.3  26.0 - 34.0 pg Final   MCHC 02/11/2021 33.3  30.0 - 36.0 g/dL Final   RDW 32/44/0102 13.8  11.5 - 15.5 % Final   Platelets 02/11/2021 337  150 - 400 K/uL Final   nRBC 02/11/2021 0.0  0.0 - 0.2 % Final   Performed at [facility], [address] [place], [place] [address]   Opiates 02/11/2021 NONE DETECTED  NONE DETECTED Final   Cocaine 02/11/2021 NONE DETECTED  NONE DETECTED Final   Benzodiazepines 02/11/2021 NONE DETECTED  NONE DETECTED  Final   Amphetamines 02/11/2021 POSITIVE (A)  NONE DETECTED Final   Tetrahydrocannabinol 02/11/2021 NONE DETECTED  NONE DETECTED Final   Barbiturates 02/11/2021 NONE DETECTED  NONE DETECTED Final   Comment: (NOTE) DRUG SCREEN FOR MEDICAL PURPOSES ONLY.  IF CONFIRMATION IS NEEDED FOR ANY PURPOSE, NOTIFY LAB WITHIN 5 DAYS.  LOWEST DETECTABLE LIMITS FOR URINE DRUG SCREEN Drug Class                     Cutoff (ng/mL) Amphetamine and metabolites    1000 Barbiturate and metabolites    200 Benzodiazepine                 200 Tricyclics and metabolites     300 Opiates and metabolites        300 Cocaine and metabolites        300 THC  50 Performed at [facility], [address] [place], [place] [address]    SARS Coronavirus 2 by RT PCR 02/11/2021 NEGATIVE  NEGATIVE Final   Comment: (NOTE) SARS-CoV-2 target nucleic acids are NOT DETECTED.  The SARS-CoV-2 RNA is generally detectable in upper respiratory specimens during the acute phase of infection. The lowest concentration of SARS-CoV-2 viral copies this assay can detect is 138 copies/mL. A negative result does not preclude SARS-Cov-2 infection and should not be used as the sole basis for treatment or other patient management decisions. A negative result may occur with  improper specimen collection/handling, submission of specimen other than nasopharyngeal swab, presence of viral mutation(s) within the areas targeted by this assay, and inadequate number of viral copies(<138 copies/mL). A negative result must be combined with clinical observations, patient history, and epidemiological information. The expected result is Negative.  Fact Sheet for Patients:  BloggerCourse.com  Fact Sheet for Healthcare Providers:  SeriousBroker.it  This test is no                          t yet approved or cleared by the [place] FDA and  has been authorized for  detection and/or diagnosis of SARS-CoV-2 by FDA under an Emergency Use Authorization (EUA). This EUA will remain  in effect (meaning this test can be used) for the duration of the COVID-19 declaration under Section 564(b)(1) of the Act, 21 U.S.C.section 360bbb-3(b)(1), unless the authorization is terminated  or revoked sooner.       Influenza A by PCR 02/11/2021 NEGATIVE  NEGATIVE Final   Influenza B by PCR 02/11/2021 NEGATIVE  NEGATIVE Final   Comment: (NOTE) The Xpert Xpress SARS-CoV-2/FLU/RSV plus assay is intended as an aid in the diagnosis of influenza from Nasopharyngeal swab specimens and should not be used as a sole basis for treatment. Nasal washings and aspirates are unacceptable for Xpert Xpress SARS-CoV-2/FLU/RSV testing.  Fact Sheet for Patients: BloggerCourse.com  Fact Sheet for Healthcare Providers: SeriousBroker.it  This test is not yet approved or cleared by the [place] FDA and has been authorized for detection and/or diagnosis of SARS-CoV-2 by FDA under an Emergency Use Authorization (EUA). This EUA will remain in effect (meaning this test can be used) for the duration of the COVID-19 declaration under Section 564(b)(1) of the Act, 21 U.S.C. section 360bbb-3(b)(1), unless the authorization is terminated or revoked.  Performed at [facility], [address] [place], [place] [address]   Admission on 02/05/2021, Discharged on 02/05/2021  Component Date Value Ref Range Status   SARS Coronavirus 2 by RT PCR 02/05/2021 NEGATIVE  NEGATIVE Final   Comment: (NOTE) SARS-CoV-2 target nucleic acids are NOT DETECTED.  The SARS-CoV-2 RNA is generally detectable in upper respiratory specimens during the acute phase of infection. The lowest concentration of SARS-CoV-2 viral copies this assay can detect is 138 copies/mL. A negative result does not preclude SARS-Cov-2 infection and should not be used as the  sole basis for treatment or other patient management decisions. A negative result may occur with  improper specimen collection/handling, submission of specimen other than nasopharyngeal swab, presence of viral mutation(s) within the areas targeted by this assay, and inadequate number of viral copies(<138 copies/mL). A negative result must be combined with clinical observations, patient history, and epidemiological information. The expected result is Negative.  Fact Sheet for Patients:  BloggerCourse.com  Fact Sheet for Healthcare Providers:  SeriousBroker.it  This test is no  t yet approved or cleared by the [place] and  has been authorized for detection and/or diagnosis of SARS-CoV-2 by FDA under an Emergency Use Authorization (EUA). This EUA will remain  in effect (meaning this test can be used) for the duration of the COVID-19 declaration under Section 564(b)(1) of the Act, 21 U.S.C.section 360bbb-3(b)(1), unless the authorization is terminated  or revoked sooner.       Influenza A by PCR 02/05/2021 NEGATIVE  NEGATIVE Final   Influenza B by PCR 02/05/2021 NEGATIVE  NEGATIVE Final   Comment: (NOTE) The Xpert Xpress SARS-CoV-2/FLU/RSV plus assay is intended as an aid in the diagnosis of influenza from Nasopharyngeal swab specimens and should not be used as a sole basis for treatment. Nasal washings and aspirates are unacceptable for Xpert Xpress SARS-CoV-2/FLU/RSV testing.  Fact Sheet for Patients: BloggerCourse.com  Fact Sheet for Healthcare Providers: SeriousBroker.it  This test is not yet approved or cleared by the [place] FDA and has been authorized for detection and/or diagnosis of SARS-CoV-2 by FDA under an Emergency Use Authorization (EUA). This EUA will remain in effect (meaning this test can be used) for the duration of  the COVID-19 declaration under Section 564(b)(1) of the Act, 21 U.S.C. section 360bbb-3(b)(1), unless the authorization is terminated or revoked.  Performed at [facility], [address], [place], [place] [address]    SARS Coronavirus 2 Ag 02/05/2021 Negative  Negative Preliminary   WBC 02/05/2021 9.2  4.0 - 10.5 K/uL Final   RBC 02/05/2021 5.05  4.22 - 5.81 MIL/uL Final   Hemoglobin 02/05/2021 14.4  13.0 - 17.0 g/dL Final   HCT 60/45/4098 43.1  39.0 - 52.0 % Final   MCV 02/05/2021 85.3  80.0 - 100.0 fL Final   MCH 02/05/2021 28.5  26.0 - 34.0 pg Final   MCHC 02/05/2021 33.4  30.0 - 36.0 g/dL Final   RDW 11/91/4782 14.0  11.5 - 15.5 % Final   Platelets 02/05/2021 346  150 - 400 K/uL Final   nRBC 02/05/2021 0.0  0.0 - 0.2 % Final   Neutrophils Relative % 02/05/2021 59  % Final   Neutro Abs 02/05/2021 5.3  1.7 - 7.7 K/uL Final   Lymphocytes Relative 02/05/2021 29  % Final   Lymphs Abs 02/05/2021 2.7  0.7 - 4.0 K/uL Final   Monocytes Relative 02/05/2021 7  % Final   Monocytes Absolute 02/05/2021 0.7  0.1 - 1.0 K/uL Final   Eosinophils Relative 02/05/2021 5  % Final   Eosinophils Absolute 02/05/2021 0.5  0.0 - 0.5 K/uL Final   Basophils Relative 02/05/2021 0  % Final   Basophils Absolute 02/05/2021 0.0  0.0 - 0.1 K/uL Final   Immature Granulocytes 02/05/2021 0  % Final   Abs Immature Granulocytes 02/05/2021 0.03  0.00 - 0.07 K/uL Final   Performed at [facility], [address] [place], [place] [address]   Sodium 02/05/2021 138  135 - 145 mmol/L Final   Potassium 02/05/2021 4.0  3.5 - 5.1 mmol/L Final   Chloride 02/05/2021 102  98 - 111 mmol/L Final   CO2 02/05/2021 25  22 - 32 mmol/L Final   Glucose, Bld 02/05/2021 88  70 - 99 mg/dL Final   Glucose reference range applies only to samples taken after fasting for at least 8 hours.   BUN 02/05/2021 15  6 - 20 mg/dL Final   Creatinine, Ser 02/05/2021 0.76  0.61 - 1.24 mg/dL Final   Calcium 30/86/5784  9.8  8.9 - 10.3  mg/dL Final   Total Protein 10/18/7626 7.5  6.5 - 8.1 g/dL Final   Albumin 31/51/7616 4.3  3.5 - 5.0 g/dL Final   AST 07/37/1062 21  15 - 41 U/L Final   ALT 02/05/2021 16  0 - 44 U/L Final   Alkaline Phosphatase 02/05/2021 76  38 - 126 U/L Final   Total Bilirubin 02/05/2021 1.0  0.3 - 1.2 mg/dL Final   GFR, Estimated 02/05/2021 >60  >60 mL/min Final   Comment: (NOTE) Calculated using the CKD-EPI Creatinine Equation (2021)    Anion gap 02/05/2021 11  5 - 15 Final   Performed at [facility], [address] [place], [place] [address]   Alcohol, Ethyl (B) 02/05/2021 <10  <10 mg/dL Final   Comment: (NOTE) Lowest detectable limit for serum alcohol is 10 mg/dL.  For medical purposes only. Performed at [facility], [address] [place], [place] [address]    POC Amphetamine UR 02/05/2021 None Detected  NONE DETECTED (Cut Off Level 1000 ng/mL) Preliminary   POC Secobarbital (BAR) 02/05/2021 None Detected  NONE DETECTED (Cut Off Level 300 ng/mL) Preliminary   POC Buprenorphine (BUP) 02/05/2021 None Detected  NONE DETECTED (Cut Off Level 10 ng/mL) Preliminary   POC Oxazepam (BZO) 02/05/2021 None Detected  NONE DETECTED (Cut Off Level 300 ng/mL) Preliminary   POC Cocaine UR 02/05/2021 None Detected  NONE DETECTED (Cut Off Level 300 ng/mL) Preliminary   POC Methamphetamine UR 02/05/2021 None Detected  NONE DETECTED (Cut Off Level 1000 ng/mL) Preliminary   POC Morphine 02/05/2021 None Detected  NONE DETECTED (Cut Off Level 300 ng/mL) Preliminary   POC Oxycodone UR 02/05/2021 None Detected  NONE DETECTED (Cut Off Level 100 ng/mL) Preliminary   POC Methadone UR 02/05/2021 None Detected  NONE DETECTED (Cut Off Level 300 ng/mL) Preliminary   POC Marijuana UR 02/05/2021 None Detected  NONE DETECTED (Cut Off Level 50 ng/mL) Preliminary   TSH 02/05/2021 5.750 (A)  0.350 - 4.500 uIU/mL Final   Comment: Performed by a 3rd Generation assay with a functional sensitivity of <=0.01  uIU/mL. Performed at [facility], [address], [place], [place] [address]    SARSCOV2ONAVIRUS 2 AG 02/05/2021 NEGATIVE  NEGATIVE Final   Comment: (NOTE) SARS-CoV-2 antigen NOT DETECTED.   Negative results are presumptive.  Negative results do not preclude SARS-CoV-2 infection and should not be used as the sole basis for treatment or other patient management decisions, including infection  control decisions, particularly in the presence of clinical signs and  symptoms consistent with COVID-19, or in those who have been in contact with the virus.  Negative results must be combined with clinical observations, patient history, and epidemiological information. The expected result is Negative.  Fact Sheet for Patients: https://www.jennings-kim.com/  Fact Sheet for Healthcare Providers: https://alexander-rogers.biz/  This test is not yet approved or cleared by the [place] FDA and  has been authorized for detection and/or diagnosis of SARS-CoV-2 by FDA under an Emergency Use Authorization (EUA).  This EUA will remain in effect (meaning this test can be used) for the duration of  the COV                          ID-19 declaration under Section 564(b)(1) of the Act, 21 U.S.C. section 360bbb-3(b)(1), unless the authorization is terminated or revoked sooner.     Free T4 02/05/2021 0.80  0.61 - 1.12 ng/dL  Final   Comment: (NOTE) Biotin ingestion may interfere with free T4 tests. If the results are inconsistent with the TSH level, previous test results, or the clinical presentation, then consider biotin interference. If needed, order repeat testing after stopping biotin. Performed at [facility], [address], [place], [place] [address]    T3, Free 02/05/2021 3.8  2.0 - 4.4 pg/mL Final   Comment: (NOTE) Performed At: [facility] [address] [place], [place] [id number] Jolene Schimke MD [id number]   Admission on  02/04/2021, Discharged on 02/04/2021  Component Date Value Ref Range Status   SARS Coronavirus 2 by RT PCR 02/04/2021 NEGATIVE  NEGATIVE Final   Comment: (NOTE) SARS-CoV-2 target nucleic acids are NOT DETECTED.  The SARS-CoV-2 RNA is generally detectable in upper respiratory specimens during the acute phase of infection. The lowest concentration of SARS-CoV-2 viral copies this assay can detect is 138 copies/mL. A negative result does not preclude SARS-Cov-2 infection and should not be used as the sole basis for treatment or other patient management decisions. A negative result may occur with  improper specimen collection/handling, submission of specimen other than nasopharyngeal swab, presence of viral mutation(s) within the areas targeted by this assay, and inadequate number of viral copies(<138 copies/mL). A negative result must be combined with clinical observations, patient history, and epidemiological information. The expected result is Negative.  Fact Sheet for Patients:  BloggerCourse.com  Fact Sheet for Healthcare Providers:  SeriousBroker.it  This test is no                          t yet approved or cleared by the [place] FDA and  has been authorized for detection and/or diagnosis of SARS-CoV-2 by FDA under an Emergency Use Authorization (EUA). This EUA will remain  in effect (meaning this test can be used) for the duration of the COVID-19 declaration under Section 564(b)(1) of the Act, 21 U.S.C.section 360bbb-3(b)(1), unless the authorization is terminated  or revoked sooner.       Influenza A by PCR 02/04/2021 NEGATIVE  NEGATIVE Final   Influenza B by PCR 02/04/2021 NEGATIVE  NEGATIVE Final   Comment: (NOTE) The Xpert Xpress SARS-CoV-2/FLU/RSV plus assay is intended as an aid in the diagnosis of influenza from Nasopharyngeal swab specimens and should not be used as a sole basis for treatment. Nasal washings  and aspirates are unacceptable for Xpert Xpress SARS-CoV-2/FLU/RSV testing.  Fact Sheet for Patients: BloggerCourse.com  Fact Sheet for Healthcare Providers: SeriousBroker.it  This test is not yet approved or cleared by the [place] FDA and has been authorized for detection and/or diagnosis of SARS-CoV-2 by FDA under an Emergency Use Authorization (EUA). This EUA will remain in effect (meaning this test can be used) for the duration of the COVID-19 declaration under Section 564(b)(1) of the Act, 21 U.S.C. section 360bbb-3(b)(1), unless the authorization is terminated or revoked.  Performed at [facility], [address], [place], [place] [address]   Admission on 01/16/2021, Discharged on 01/24/2021  Component Date Value Ref Range Status   Cholesterol 01/17/2021 94  0 - 200 mg/dL Final   Triglycerides 78/46/9629 100  <150 mg/dL Final   HDL 52/84/1324 28 (A)  >40 mg/dL Final   Total CHOL/HDL Ratio 01/17/2021 3.4  RATIO Final   VLDL 01/17/2021 20  0 - 40 mg/dL Final   LDL Cholesterol 01/17/2021 46  0 - 99 mg/dL Final   Comment:  Total Cholesterol/HDL:CHD Risk Coronary Heart Disease Risk Table                     Men   Women  1/2 Average Risk   3.4   3.3  Average Risk       5.0   4.4  2 X Average Risk   9.6   7.1  3 X Average Risk  23.4   11.0        Use the calculated Patient Ratio above and the CHD Risk Table to determine the patient's CHD Risk.        ATP III CLASSIFICATION (LDL):  <100     mg/dL   Optimal  161-096  mg/dL   Near or Above                    Optimal  130-159  mg/dL   Borderline  045-409  mg/dL   High  >811     mg/dL   Very High Performed at [facility], [address] [place], [place] [address]    Hgb A1c MFr Bld 01/17/2021 6.0 (A)  4.8 - 5.6 % Final   Comment: (NOTE)         Prediabetes: 5.7 - 6.4         Diabetes: >6.4         Glycemic control for adults with diabetes:  <7.0    Mean Plasma Glucose 01/17/2021 126  mg/dL Final   Comment: (NOTE) Performed At: [facility] [address] [place], [place] [id number] Jolene Schimke MD MV:[id number]   Admission on 01/16/2021, Discharged on 01/16/2021  Component Date Value Ref Range Status   Sodium 01/16/2021 134 (A)  135 - 145 mmol/L Final   Potassium 01/16/2021 3.8  3.5 - 5.1 mmol/L Final   Chloride 01/16/2021 96 (A)  98 - 111 mmol/L Final   CO2 01/16/2021 27  22 - 32 mmol/L Final   Glucose, Bld 01/16/2021 79  70 - 99 mg/dL Final   Glucose reference range applies only to samples taken after fasting for at least 8 hours.   BUN 01/16/2021 8  6 - 20 mg/dL Final   Creatinine, Ser 01/16/2021 0.83  0.61 - 1.24 mg/dL Final   Calcium 84/13/2440 9.3  8.9 - 10.3 mg/dL Final   Total Protein 02/21/2535 7.6  6.5 - 8.1 g/dL Final   Albumin 64/40/3474 4.0  3.5 - 5.0 g/dL Final   AST 25/95/6387 22  15 - 41 U/L Final   ALT 01/16/2021 18  0 - 44 U/L Final   Alkaline Phosphatase 01/16/2021 58  38 - 126 U/L Final   Total Bilirubin 01/16/2021 0.8  0.3 - 1.2 mg/dL Final   GFR, Estimated 01/16/2021 >60  >60 mL/min Final   Comment: (NOTE) Calculated using the CKD-EPI Creatinine Equation (2021)    Anion gap 01/16/2021 11  5 - 15 Final   Performed at [facility] [address] [place], [place] [address]   Alcohol, Ethyl (B) 01/16/2021 <10  <10 mg/dL Final   Comment: (NOTE) Lowest detectable limit for serum alcohol is 10 mg/dL.  For medical purposes only. Performed at [facility], [address] [place], [place] [address]    Salicylate Lvl 01/16/2021 <7.0 (A)  7.0 - 30.0 mg/dL Final   Performed at [facility], [address] [place], [place] [address]   Acetaminophen (Tylenol), Serum 01/16/2021 <10 (A)  10 - 30 ug/mL Final  Comment: (NOTE) Therapeutic concentrations vary significantly. A range of 10-30 ug/mL  may be an effective concentration for many patients. However, some   are best treated at concentrations outside of this range. Acetaminophen concentrations >150 ug/mL at 4 hours after ingestion  and >50 ug/mL at 12 hours after ingestion are often associated with  toxic reactions.  Performed at [facility], [address] [place], [place] [address]    WBC 01/16/2021 8.8  4.0 - 10.5 K/uL Final   RBC 01/16/2021 4.89  4.22 - 5.81 MIL/uL Final   Hemoglobin 01/16/2021 14.4  13.0 - 17.0 g/dL Final   HCT 60/45/4098 41.7  39.0 - 52.0 % Final   MCV 01/16/2021 85.3  80.0 - 100.0 fL Final   MCH 01/16/2021 29.4  26.0 - 34.0 pg Final   MCHC 01/16/2021 34.5  30.0 - 36.0 g/dL Final   RDW 11/91/4782 13.5  11.5 - 15.5 % Final   Platelets 01/16/2021 402 (A)  150 - 400 K/uL Final   nRBC 01/16/2021 0.0  0.0 - 0.2 % Final   Performed at [facility], [address] [place], [place] [address]   Tricyclic, Ur Screen 01/16/2021 NONE DETECTED  NONE DETECTED Final   Amphetamines, Ur Screen 01/16/2021 POSITIVE (A)  NONE DETECTED Final   MDMA (Ecstasy)Ur Screen 01/16/2021 NONE DETECTED  NONE DETECTED Final   Cocaine Metabolite,Ur [place] 01/16/2021 NONE DETECTED  NONE DETECTED Final   Opiate, Ur Screen 01/16/2021 NONE DETECTED  NONE DETECTED Final   Phencyclidine (PCP) Ur S 01/16/2021 NONE DETECTED  NONE DETECTED Final   Cannabinoid 50 Ng, Ur [place] 01/16/2021 NONE DETECTED  NONE DETECTED Final   Barbiturates, Ur Screen 01/16/2021 NONE DETECTED  NONE DETECTED Final   Benzodiazepine, Ur Scrn 01/16/2021 NONE DETECTED  NONE DETECTED Final   Methadone Scn, Ur 01/16/2021 NONE DETECTED  NONE DETECTED Final   Comment: (NOTE) Tricyclics + metabolites, urine    Cutoff 1000 ng/mL Amphetamines + metabolites, urine  Cutoff 1000 ng/mL MDMA (Ecstasy), urine              Cutoff 500 ng/mL Cocaine Metabolite, urine          Cutoff 300 ng/mL Opiate + metabolites, urine        Cutoff 300 ng/mL Phencyclidine (PCP), urine         Cutoff 25 ng/mL Cannabinoid, urine                  Cutoff 50 ng/mL Barbiturates + metabolites, urine  Cutoff 200 ng/mL Benzodiazepine, urine              Cutoff 200 ng/mL Methadone, urine                   Cutoff 300 ng/mL  The urine drug screen provides only a preliminary, unconfirmed analytical test result and should not be used for non-medical purposes. Clinical consideration and professional judgment should be applied to any positive drug screen result due to possible interfering substances. A more specific alternate chemical method must be used in order to obtain a confirmed analytical result. Gas chromatography / mass spectrometry (GC/MS) is the preferred confirm                          atory method. Performed at [facility], [address] [place], [place] [address]    SARS Coronavirus 2 by RT PCR 01/16/2021 NEGATIVE  NEGATIVE Final   Comment: (NOTE) SARS-CoV-2  target nucleic acids are NOT DETECTED.  The SARS-CoV-2 RNA is generally detectable in upper respiratory specimens during the acute phase of infection. The lowest concentration of SARS-CoV-2 viral copies this assay can detect is 138 copies/mL. A negative result does not preclude SARS-Cov-2 infection and should not be used as the sole basis for treatment or other patient management decisions. A negative result may occur with  improper specimen collection/handling, submission of specimen other than nasopharyngeal swab, presence of viral mutation(s) within the areas targeted by this assay, and inadequate number of viral copies(<138 copies/mL). A negative result must be combined with clinical observations, patient history, and epidemiological information. The expected result is Negative.  Fact Sheet for Patients:  BloggerCourse.com  Fact Sheet for Healthcare Providers:  SeriousBroker.it  This test is no                          t yet approved or cleared by the [place] FDA and  has been authorized  for detection and/or diagnosis of SARS-CoV-2 by FDA under an Emergency Use Authorization (EUA). This EUA will remain  in effect (meaning this test can be used) for the duration of the COVID-19 declaration under Section 564(b)(1) of the Act, 21 U.S.C.section 360bbb-3(b)(1), unless the authorization is terminated  or revoked sooner.       Influenza A by PCR 01/16/2021 NEGATIVE  NEGATIVE Final   Influenza B by PCR 01/16/2021 NEGATIVE  NEGATIVE Final   Comment: (NOTE) The Xpert Xpress SARS-CoV-2/FLU/RSV plus assay is intended as an aid in the diagnosis of influenza from Nasopharyngeal swab specimens and should not be used as a sole basis for treatment. Nasal washings and aspirates are unacceptable for Xpert Xpress SARS-CoV-2/FLU/RSV testing.  Fact Sheet for Patients: BloggerCourse.com  Fact Sheet for Healthcare Providers: SeriousBroker.it  This test is not yet approved or cleared by the [place] FDA and has been authorized for detection and/or diagnosis of SARS-CoV-2 by FDA under an Emergency Use Authorization (EUA). This EUA will remain in effect (meaning this test can be used) for the duration of the COVID-19 declaration under Section 564(b)(1) of the Act, 21 U.S.C. section 360bbb-3(b)(1), unless the authorization is terminated or revoked.  Performed at [facility], [address] [place], [place] [address]   Admission on 01/14/2021, Discharged on 01/15/2021  Component Date Value Ref Range Status   WBC 01/14/2021 10.6 (A)  4.0 - 10.5 K/uL Final   RBC 01/14/2021 4.88  4.22 - 5.81 MIL/uL Final   Hemoglobin 01/14/2021 14.1  13.0 - 17.0 g/dL Final   HCT 16/10/3708 42.0  39.0 - 52.0 % Final   MCV 01/14/2021 86.1  80.0 - 100.0 fL Final   MCH 01/14/2021 28.9  26.0 - 34.0 pg Final   MCHC 01/14/2021 33.6  30.0 - 36.0 g/dL Final   RDW 62/69/4854 13.6  11.5 - 15.5 % Final   Platelets 01/14/2021 389  150 - 400 K/uL Final    nRBC 01/14/2021 0.0  0.0 - 0.2 % Final   Neutrophils Relative % 01/14/2021 67  % Final   Neutro Abs 01/14/2021 7.1  1.7 - 7.7 K/uL Final   Lymphocytes Relative 01/14/2021 19  % Final   Lymphs Abs 01/14/2021 2.0  0.7 - 4.0 K/uL Final   Monocytes Relative 01/14/2021 8  % Final   Monocytes Absolute 01/14/2021 0.8  0.1 - 1.0 K/uL Final   Eosinophils Relative 01/14/2021 5  % Final   Eosinophils Absolute 01/14/2021 0.5  0.0 - 0.5 K/uL Final   Basophils Relative 01/14/2021 1  % Final   Basophils Absolute 01/14/2021 0.1  0.0 - 0.1 K/uL Final   Immature Granulocytes 01/14/2021 0  % Final   Abs Immature Granulocytes 01/14/2021 0.03  0.00 - 0.07 K/uL Final   Performed at [facility], [address] [place], [place] [address]   Sodium 01/14/2021 134 (A)  135 - 145 mmol/L Final   Potassium 01/14/2021 4.0  3.5 - 5.1 mmol/L Final   Chloride 01/14/2021 97 (A)  98 - 111 mmol/L Final   CO2 01/14/2021 29  22 - 32 mmol/L Final   Glucose, Bld 01/14/2021 86  70 - 99 mg/dL Final   Glucose reference range applies only to samples taken after fasting for at least 8 hours.   BUN 01/14/2021 13  6 - 20 mg/dL Final   Creatinine, Ser 01/14/2021 0.93  0.61 - 1.24 mg/dL Final   Calcium 37/16/9678 9.0  8.9 - 10.3 mg/dL Final   Total Protein 93/81/0175 7.9  6.5 - 8.1 g/dL Final   Albumin 02/18/8526 4.2  3.5 - 5.0 g/dL Final   AST 78/24/2353 30  15 - 41 U/L Final   ALT 01/14/2021 23  0 - 44 U/L Final   Alkaline Phosphatase 01/14/2021 65  38 - 126 U/L Final   Total Bilirubin 01/14/2021 0.9  0.3 - 1.2 mg/dL Final   GFR, Estimated 01/14/2021 >60  >60 mL/min Final   Comment: (NOTE) Calculated using the CKD-EPI Creatinine Equation (2021)    Anion gap 01/14/2021 8  5 - 15 Final   Performed at [facility], [address], [place], [place] [address]   Opiates 01/15/2021 NONE DETECTED  NONE DETECTED Final   Cocaine 01/15/2021 NONE DETECTED  NONE DETECTED Final   Benzodiazepines 01/15/2021 NONE DETECTED  NONE DETECTED  Final   Amphetamines 01/15/2021 POSITIVE (A)  NONE DETECTED Final   Tetrahydrocannabinol 01/15/2021 NONE DETECTED  NONE DETECTED Final   Barbiturates 01/15/2021 NONE DETECTED  NONE DETECTED Final   Comment: (NOTE) DRUG SCREEN FOR MEDICAL PURPOSES ONLY.  IF CONFIRMATION IS NEEDED FOR ANY PURPOSE, NOTIFY LAB WITHIN 5 DAYS.  LOWEST DETECTABLE LIMITS FOR URINE DRUG SCREEN Drug Class                     Cutoff (ng/mL) Amphetamine and metabolites    1000 Barbiturate and metabolites    200 Benzodiazepine                 200 Tricyclics and metabolites     300 Opiates and metabolites        300 Cocaine and metabolites        300 THC                            50 Performed at [facility], [address] [place], [place] [address]    Alcohol, Ethyl (B) 01/14/2021 <10  <10 mg/dL Final   Comment: (NOTE) Lowest detectable limit for serum alcohol is 10 mg/dL.  For medical purposes only. Performed at [facility], [address], [place], [place] [address]   Admission on 12/03/2020, Discharged on 12/03/2020  Component Date Value Ref Range Status   WBC 12/03/2020 8.0  4.0 - 10.5 K/uL Final   RBC 12/03/2020 4.19 (A)  4.22 - 5.81 MIL/uL Final   Hemoglobin 12/03/2020 12.3 (A)  13.0 - 17.0 g/dL Final   HCT 95/12/3265 36.8 (A)  39.0 - 52.0 % Final   MCV 12/03/2020 87.8  80.0 - 100.0 fL Final   MCH 12/03/2020 29.4  26.0 - 34.0 pg Final   MCHC 12/03/2020 33.4  30.0 - 36.0 g/dL Final   RDW 16/01/9603 14.6  11.5 - 15.5 % Final   Platelets 12/03/2020 253  150 - 400 K/uL Final   nRBC 12/03/2020 0.0  0.0 - 0.2 % Final   Performed at [facility], [address] [place], [place] [address]   Sodium 12/03/2020 137  135 - 145 mmol/L Final   Potassium 12/03/2020 4.0  3.5 - 5.1 mmol/L Final   Chloride 12/03/2020 107  98 - 111 mmol/L Final   CO2 12/03/2020 24  22 - 32 mmol/L Final   Glucose, Bld 12/03/2020 96  70 - 99 mg/dL Final   Glucose reference range applies only to samples taken after fasting for at  least 8 hours.   BUN 12/03/2020 16  6 - 20 mg/dL Final   Creatinine, Ser 12/03/2020 0.91  0.61 - 1.24 mg/dL Final   Calcium 11/91/4782 8.7 (A)  8.9 - 10.3 mg/dL Final   Total Protein 95/62/1308 6.9  6.5 - 8.1 g/dL Final   Albumin 65/78/4696 4.0  3.5 - 5.0 g/dL Final   AST 29/52/8413 13 (A)  15 - 41 U/L Final   ALT 12/03/2020 14  0 - 44 U/L Final   Alkaline Phosphatase 12/03/2020 61  38 - 126 U/L Final   Total Bilirubin 12/03/2020 0.8  0.3 - 1.2 mg/dL Final   GFR, Estimated 12/03/2020 >60  >60 mL/min Final   Comment: (NOTE) Calculated using the CKD-EPI Creatinine Equation (2021)    Anion gap 12/03/2020 6  5 - 15 Final   Performed at [facility], [address] [place], [place] [address]   Troponin I (High Sensitivity) 12/03/2020 <2  <18 ng/L Final   Comment: (NOTE) Elevated high sensitivity troponin I (hsTnI) values and significant  changes across serial measurements may suggest ACS but many other  chronic and acute conditions are known to elevate hsTnI results.  Refer to the "Links" section for chest pain algorithms and additional  guidance. Performed at [facility], [address] [place], [place] [address]    Alcohol, Ethyl (B) 12/03/2020 <10  <10 mg/dL Final   Comment: (NOTE) Lowest detectable limit for serum alcohol is 10 mg/dL.  For medical purposes only. Performed at [facility], [address], [place], [place] [address]    Opiates 12/03/2020 NONE DETECTED  NONE DETECTED Final   Cocaine 12/03/2020 NONE DETECTED  NONE DETECTED Final   Benzodiazepines 12/03/2020 NONE DETECTED  NONE DETECTED Final   Amphetamines 12/03/2020 NONE DETECTED  NONE DETECTED Final   Tetrahydrocannabinol 12/03/2020 NONE DETECTED  NONE DETECTED Final   Barbiturates 12/03/2020 NONE DETECTED  NONE DETECTED Final   Comment: (NOTE) DRUG SCREEN FOR MEDICAL PURPOSES ONLY.  IF CONFIRMATION IS NEEDED FOR ANY PURPOSE, NOTIFY LAB WITHIN 5 DAYS.  LOWEST DETECTABLE LIMITS FOR URINE DRUG SCREEN Drug  Class                     Cutoff (ng/mL) Amphetamine and metabolites    1000 Barbiturate and metabolites    200 Benzodiazepine                 200 Tricyclics and metabolites     300 Opiates and metabolites        300 Cocaine and metabolites  300 THC                            50 Performed at [facility], [address], [place], [place] [address]    Troponin I (High Sensitivity) 12/03/2020 2  <18 ng/L Final   Comment: (NOTE) Elevated high sensitivity troponin I (hsTnI) values and significant  changes across serial measurements may suggest ACS but many other  chronic and acute conditions are known to elevate hsTnI results.  Refer to the "Links" section for chest pain algorithms and additional  guidance. Performed at [facility], [address] [place], [place] [address]   Admission on 11/30/2020, Discharged on 12/01/2020  Component Date Value Ref Range Status   Sodium 11/30/2020 138  135 - 145 mmol/L Final   Potassium 11/30/2020 3.8  3.5 - 5.1 mmol/L Final   Chloride 11/30/2020 106  98 - 111 mmol/L Final   CO2 11/30/2020 26  22 - 32 mmol/L Final   Glucose, Bld 11/30/2020 99  70 - 99 mg/dL Final   Glucose reference range applies only to samples taken after fasting for at least 8 hours.   BUN 11/30/2020 8  6 - 20 mg/dL Final   Creatinine, Ser 11/30/2020 0.78  0.61 - 1.24 mg/dL Final   Calcium 09/81/1914 8.7 (A)  8.9 - 10.3 mg/dL Final   Total Protein 78/29/5621 7.1  6.5 - 8.1 g/dL Final   Albumin 30/86/5784 4.0  3.5 - 5.0 g/dL Final   AST 69/62/9528 15  15 - 41 U/L Final   ALT 11/30/2020 18  0 - 44 U/L Final   Alkaline Phosphatase 11/30/2020 69  38 - 126 U/L Final   Total Bilirubin 11/30/2020 0.9  0.3 - 1.2 mg/dL Final   GFR, Estimated 11/30/2020 >60  >60 mL/min Final   Comment: (NOTE) Calculated using the CKD-EPI Creatinine Equation (2021)    Anion gap 11/30/2020 6  5 - 15 Final   Performed at [facility], [address], [place], [place] [address]   Alcohol, Ethyl (B)  11/30/2020 <10  <10 mg/dL Final   Comment: (NOTE) Lowest detectable limit for serum alcohol is 10 mg/dL.  For medical purposes only. Performed at [facility], [address] [place], [place] [address]    WBC 11/30/2020 8.8  4.0 - 10.5 K/uL Final   RBC 11/30/2020 4.40  4.22 - 5.81 MIL/uL Final   Hemoglobin 11/30/2020 12.7 (A)  13.0 - 17.0 g/dL Final   HCT 72/53/6644 38.0 (A)  39.0 - 52.0 % Final   MCV 11/30/2020 86.4  80.0 - 100.0 fL Final   MCH 11/30/2020 28.9  26.0 - 34.0 pg Final   MCHC 11/30/2020 33.4  30.0 - 36.0 g/dL Final   RDW 03/47/4259 14.2  11.5 - 15.5 % Final   Platelets 11/30/2020 276  150 - 400 K/uL Final   nRBC 11/30/2020 0.0  0.0 - 0.2 % Final   Neutrophils Relative % 11/30/2020 66  % Final   Neutro Abs 11/30/2020 5.8  1.7 - 7.7 K/uL Final   Lymphocytes Relative 11/30/2020 25  % Final   Lymphs Abs 11/30/2020 2.2  0.7 - 4.0 K/uL Final   Monocytes Relative 11/30/2020 7  % Final   Monocytes Absolute 11/30/2020 0.6  0.1 - 1.0 K/uL Final   Eosinophils Relative 11/30/2020 2  % Final   Eosinophils Absolute 11/30/2020 0.2  0.0 - 0.5 K/uL Final   Basophils Relative 11/30/2020 0  % Final  Basophils Absolute 11/30/2020 0.0  0.0 - 0.1 K/uL Final   Immature Granulocytes 11/30/2020 0  % Final   Abs Immature Granulocytes 11/30/2020 0.02  0.00 - 0.07 K/uL Final   Performed at [facility], [address] [place], [place] [address]   Acetaminophen (Tylenol), Serum 11/30/2020 <10 (A)  10 - 30 ug/mL Final   Comment: (NOTE) Therapeutic concentrations vary significantly. A range of 10-30 ug/mL  may be an effective concentration for many patients. However, some  are best treated at concentrations outside of this range. Acetaminophen concentrations >150 ug/mL at 4 hours after ingestion  and >50 ug/mL at 12 hours after ingestion are often associated with  toxic reactions.  Performed at [facility], [address], [place], [place] [address]    Salicylate Lvl 11/30/2020 <7.0 (A)  7.0  - 30.0 mg/dL Final   Performed at [facility], [address], [place], [place] [address]   Color, Urine 11/30/2020 YELLOW  YELLOW Final   APPearance 11/30/2020 CLEAR  CLEAR Final   Specific Gravity, Urine 11/30/2020 1.016  1.005 - 1.030 Final   pH 11/30/2020 7.0  5.0 - 8.0 Final   Glucose, UA 11/30/2020 NEGATIVE  NEGATIVE mg/dL Final   Hgb urine dipstick 11/30/2020 NEGATIVE  NEGATIVE Final   Bilirubin Urine 11/30/2020 NEGATIVE  NEGATIVE Final   Ketones, ur 11/30/2020 NEGATIVE  NEGATIVE mg/dL Final   Protein, ur 91/47/8295 NEGATIVE  NEGATIVE mg/dL Final   Nitrite 62/13/0865 NEGATIVE  NEGATIVE Final   Leukocytes,Ua 11/30/2020 NEGATIVE  NEGATIVE Final   Performed at [facility], [address], [place], [place] [address]   Opiates 11/30/2020 NONE DETECTED  NONE DETECTED Final   Cocaine 11/30/2020 NONE DETECTED  NONE DETECTED Final   Benzodiazepines 11/30/2020 NONE DETECTED  NONE DETECTED Final   Amphetamines 11/30/2020 NONE DETECTED  NONE DETECTED Final   Tetrahydrocannabinol 11/30/2020 NONE DETECTED  NONE DETECTED Final   Barbiturates 11/30/2020 NONE DETECTED  NONE DETECTED Final   Comment: (NOTE) DRUG SCREEN FOR MEDICAL PURPOSES ONLY.  IF CONFIRMATION IS NEEDED FOR ANY PURPOSE, NOTIFY LAB WITHIN 5 DAYS.  LOWEST DETECTABLE LIMITS FOR URINE DRUG SCREEN Drug Class                     Cutoff (ng/mL) Amphetamine and metabolites    1000 Barbiturate and metabolites    200 Benzodiazepine                 200 Tricyclics and metabolites     300 Opiates and metabolites        300 Cocaine and metabolites        300 THC                            50 Performed at [facility], [address] [place], [place] [address]   Admission on 11/13/2020, Discharged on 11/15/2020  Component Date Value Ref Range Status   SARS Coronavirus 2 by RT PCR 11/13/2020 NEGATIVE  NEGATIVE Final   Comment: (NOTE) SARS-CoV-2 target nucleic acids are NOT DETECTED.  The SARS-CoV-2 RNA is generally detectable in  upper respiratory specimens during the acute phase of infection. The lowest concentration of SARS-CoV-2 viral copies this assay can detect is 138 copies/mL. A negative result does not preclude SARS-Cov-2 infection and should not be used as the sole basis for treatment or other patient management decisions. A negative result may occur with  improper specimen collection/handling, submission of specimen  other than nasopharyngeal swab, presence of viral mutation(s) within the areas targeted by this assay, and inadequate number of viral copies(<138 copies/mL). A negative result must be combined with clinical observations, patient history, and epidemiological information. The expected result is Negative.  Fact Sheet for Patients:  BloggerCourse.com  Fact Sheet for Healthcare Providers:  SeriousBroker.it  This test is no                          t yet approved or cleared by the [place] FDA and  has been authorized for detection and/or diagnosis of SARS-CoV-2 by FDA under an Emergency Use Authorization (EUA). This EUA will remain  in effect (meaning this test can be used) for the duration of the COVID-19 declaration under Section 564(b)(1) of the Act, 21 U.S.C.section 360bbb-3(b)(1), unless the authorization is terminated  or revoked sooner.       Influenza A by PCR 11/13/2020 NEGATIVE  NEGATIVE Final   Influenza B by PCR 11/13/2020 NEGATIVE  NEGATIVE Final   Comment: (NOTE) The Xpert Xpress SARS-CoV-2/FLU/RSV plus assay is intended as an aid in the diagnosis of influenza from Nasopharyngeal swab specimens and should not be used as a sole basis for treatment. Nasal washings and aspirates are unacceptable for Xpert Xpress SARS-CoV-2/FLU/RSV testing.  Fact Sheet for Patients: BloggerCourse.com  Fact Sheet for Healthcare Providers: SeriousBroker.it  This test is not yet approved  or cleared by the [place] FDA and has been authorized for detection and/or diagnosis of SARS-CoV-2 by FDA under an Emergency Use Authorization (EUA). This EUA will remain in effect (meaning this test can be used) for the duration of the COVID-19 declaration under Section 564(b)(1) of the Act, 21 U.S.C. section 360bbb-3(b)(1), unless the authorization is terminated or revoked.  Performed at [facility], [address] [place], [place] [address]    Sodium 11/13/2020 139  135 - 145 mmol/L Final   Potassium 11/13/2020 3.6  3.5 - 5.1 mmol/L Final   Chloride 11/13/2020 105  98 - 111 mmol/L Final   CO2 11/13/2020 26  22 - 32 mmol/L Final   Glucose, Bld 11/13/2020 74  70 - 99 mg/dL Final   Glucose reference range applies only to samples taken after fasting for at least 8 hours.   BUN 11/13/2020 7  6 - 20 mg/dL Final   Creatinine, Ser 11/13/2020 0.81  0.61 - 1.24 mg/dL Final   Calcium 47/82/9562 9.2  8.9 - 10.3 mg/dL Final   Total Protein 13/11/6576 7.1  6.5 - 8.1 g/dL Final   Albumin 46/96/2952 4.0  3.5 - 5.0 g/dL Final   AST 84/13/2440 18  15 - 41 U/L Final   ALT 11/13/2020 16  0 - 44 U/L Final   Alkaline Phosphatase 11/13/2020 56  38 - 126 U/L Final   Total Bilirubin 11/13/2020 0.9  0.3 - 1.2 mg/dL Final   GFR, Estimated 11/13/2020 >60  >60 mL/min Final   Comment: (NOTE) Calculated using the CKD-EPI Creatinine Equation (2021)    Anion gap 11/13/2020 8  5 - 15 Final   Performed at [facility], [address] [place], [place] [address]   Alcohol, Ethyl (B) 11/13/2020 <10  <10 mg/dL Final   Comment: (NOTE) Lowest detectable limit for serum alcohol is 10 mg/dL.  For medical purposes only. Performed at [facility], [address], [place], [place] [address]    Tricyclic, Ur Screen 11/14/2020 NONE DETECTED  NONE DETECTED Final  Amphetamines, Ur Screen 11/14/2020 NONE DETECTED  NONE DETECTED Final   MDMA (Ecstasy)Ur Screen 11/14/2020 NONE  DETECTED  NONE DETECTED Final   Cocaine Metabolite,Ur [place] 11/14/2020 NONE DETECTED  NONE DETECTED Final   Opiate, Ur Screen 11/14/2020 NONE DETECTED  NONE DETECTED Final   Phencyclidine (PCP) Ur S 11/14/2020 NONE DETECTED  NONE DETECTED Final   Cannabinoid 50 Ng, Ur [place] 11/14/2020 NONE DETECTED  NONE DETECTED Final   Barbiturates, Ur Screen 11/14/2020 NONE DETECTED  NONE DETECTED Final   Benzodiazepine, Ur Scrn 11/14/2020 NONE DETECTED  NONE DETECTED Final   Methadone Scn, Ur 11/14/2020 NONE DETECTED  NONE DETECTED Final   Comment: (NOTE) Tricyclics + metabolites, urine    Cutoff 1000 ng/mL Amphetamines + metabolites, urine  Cutoff 1000 ng/mL MDMA (Ecstasy), urine              Cutoff 500 ng/mL Cocaine Metabolite, urine          Cutoff 300 ng/mL Opiate + metabolites, urine        Cutoff 300 ng/mL Phencyclidine (PCP), urine         Cutoff 25 ng/mL Cannabinoid, urine                 Cutoff 50 ng/mL Barbiturates + metabolites, urine  Cutoff 200 ng/mL Benzodiazepine, urine              Cutoff 200 ng/mL Methadone, urine                   Cutoff 300 ng/mL  The urine drug screen provides only a preliminary, unconfirmed analytical test result and should not be used for non-medical purposes. Clinical consideration and professional judgment should be applied to any positive drug screen result due to possible interfering substances. A more specific alternate chemical method must be used in order to obtain a confirmed analytical result. Gas chromatography / mass spectrometry (GC/MS) is the preferred confirm                          atory method. Performed at [name], [address] [place], [place] [address]    WBC 11/13/2020 8.4  4.0 - 10.5 K/uL Final   RBC 11/13/2020 4.34  4.22 - 5.81 MIL/uL Final   Hemoglobin 11/13/2020 12.3 (A)  13.0 - 17.0 g/dL Final   HCT 85/46/2703 37.3 (A)  39.0 - 52.0 % Final   MCV 11/13/2020 85.9  80.0 - 100.0 fL Final   MCH 11/13/2020 28.3  26.0 -  34.0 pg Final   MCHC 11/13/2020 33.0  30.0 - 36.0 g/dL Final   RDW 50/12/3816 14.0  11.5 - 15.5 % Final   Platelets 11/13/2020 286  150 - 400 K/uL Final   nRBC 11/13/2020 0.0  0.0 - 0.2 % Final   Neutrophils Relative % 11/13/2020 64  % Final   Neutro Abs 11/13/2020 5.4  1.7 - 7.7 K/uL Final   Lymphocytes Relative 11/13/2020 26  % Final   Lymphs Abs 11/13/2020 2.2  0.7 - 4.0 K/uL Final   Monocytes Relative 11/13/2020 7  % Final   Monocytes Absolute 11/13/2020 0.6  0.1 - 1.0 K/uL Final   Eosinophils Relative 11/13/2020 3  % Final   Eosinophils Absolute 11/13/2020 0.2  0.0 - 0.5 K/uL Final   Basophils Relative 11/13/2020 0  % Final   Basophils Absolute 11/13/2020 0.0  0.0 - 0.1 K/uL Final   Immature Granulocytes 11/13/2020 0  % Final  Abs Immature Granulocytes 11/13/2020 0.02  0.00 - 0.07 K/uL Final   Performed at [facility], [address] [place], [place] [address]   Salicylate Lvl 11/13/2020 <7.0 (A)  7.0 - 30.0 mg/dL Final   Performed at [facility], [address] [place], [place] [address]   Acetaminophen (Tylenol), Serum 11/13/2020 <10 (A)  10 - 30 ug/mL Final   Comment: (NOTE) Therapeutic concentrations vary significantly. A range of 10-30 ug/mL  may be an effective concentration for many patients. However, some  are best treated at concentrations outside of this range. Acetaminophen concentrations >150 ug/mL at 4 hours after ingestion  and >50 ug/mL at 12 hours after ingestion are often associated with  toxic reactions.  Performed at [facility], [address] [place], [place] [address]   There may be more visits with results that are not included.    Allergies: Latex   Medical Decision Making  Patient recommended for continuous assessment by Nira Conn, NP., Patient transferred from [facility] and admitted to the [facility] continuous assessment for safety and mood stabilization.   Lab Orders         Resp Panel by RT-PCR (Flu A&B, Covid)  Nasopharyngeal Swab         Ethanol         POCT Urine Drug Screen - (ICup)         POC SARS Coronavirus 2 Ag-ED - Nasal Swab         POC SARS Coronavirus 2 Ag    EKG ordered.  Labs from most recent encounter reviewed.   Restarted home medications:  QUEtiapine  50 mg Oral QHS   sertraline  50 mg Oral Daily    Recommendations  Based on my evaluation the patient does not appear to have an emergency medical condition.  Layla Barter, NP 02/13/21  11:19 AM

## 2021-02-13 NOTE — ED Notes (Signed)
Pt ate lunch and now, resting with eyes closed in no acute distress. Safety maintained.

## 2021-02-13 NOTE — ED Notes (Signed)
Safe transport called and will arrive in 10 minutes. Spoke with Fayrene Fearing.

## 2021-02-13 NOTE — ED Notes (Signed)
Breakfast given.  

## 2021-02-13 NOTE — ED Notes (Signed)
Received a knife from security and placed in belongings, hand delivered to [place] from safe transport.

## 2021-02-13 NOTE — ED Notes (Signed)
Called report to bobby at [facility], said to send patient at [facility]

## 2021-02-13 NOTE — BH Assessment (Addendum)
Care Management - Follow Up [facility] Discharges   Writer attempted to make contact with patient today and was unsuccessful.  Writer left a HIPPA compliant voice message.   Per chart review, Follow-up with outpatient psychiatry, resources provided.  

## 2021-02-13 NOTE — ED Notes (Signed)
30 yo male transfer from [facility] endorsing SI w/plan to cut self d/t meth relapse. Pt arrived by safe transport. Calm, cooperative throughout interview process. Skin assessment completed. Oriented to unit. Meal and drink offered. At currrent, pt continue to endorse SI. Pt verbally contract for safety. Will monitor for safety.

## 2021-02-14 NOTE — ED Provider Notes (Signed)
FBC/OBS ASAP Discharge Summary  Date and Time: 02/14/2021 11:36 AM  Name: Nathan Koch  MRN:  [id number]   Discharge Diagnoses:  Final diagnoses:  Substance induced mood disorder ([name])  Methamphetamine use disorder, moderate ([name])    Subjective:  Nathan Koch, 30 y.o., male patient presented to [facility] on 02/13/2021 with suicidal ideations.  He was admitted into the continuous assessment unit..  Patient seen face to face by this provider, and consulted with Dr. Bronwen Betters; and  chart reviewed on 02/14/21.  On evaluation [name] reports, " I want to be discharged I have been accepted at [facility]".  Patient is well-known to our service.  He has multiple ED and hospital admissions related to suicidal ideations.  Patient is homeless.  Per chart review patient appears to have secondary gain related to suicidal ideations for shelter.  During evaluation ALEKSA CATTERTON is in sitting position in no acute distress.  He is fairly groomed and makes good eye contact.  He is alert/oriented x 4 and cooperative.  At this time patient denies depression but endorses anxiety.  His affect is congruent.  His speech is clear, coherent, normal rate and tone.  Reports he got good sleep last night.  His thought process is coherent and relevant; There is no indication that he is currently responding to internal/external stimuli or experiencing delusional thought content.  At this time patient denies suicidal ideations.  Reports he has no intent or plan to harm himself.  States he does have access to a X-Acto knife in his suitcase.  States he uses this knife for work and for protection if needed due to his homelessness.  Patient contracts for safety.  Contracts that he will not use the knife to harm himself.  Patient is forward thinking and is looking forward to going to [facility].  Patient is requesting to be discharged.  Denies homicidal ideations.  Denies auditory and  visual hallucinations.  Denies paranoia and delusional thought.  Patient states he will take a safe transport ride to retrieve his paycheck.  From that point he will use the bus pass provided to him to be [place] bus [place].  At this point he will take route 4 with the part bus pass provided to him to be transported to [facility].  At this point patient states he will pay for a taxi to be transported to [facility].  Stay Summary:  Nathan Koch was admitted to [facility] Continuous Assessment unit for suicidal ideations and crisis management.  He was treated with his home medications Zoloft 50 mg p.o. daily and Seroquel 50 mg p.o. nightly which were tolerated with no adverse reactions.   Nathan Koch was discharged with current medication and was instructed on how to take medications as prescribed.  He is mprovement was monitored by continuous assessment/observation and his report of symptom reduction.  His emotional and mental status was also monitored by staff.            Nathan Koch was evaluated for stability and plans for continued recovery upon discharge.  Nathan Koch motivation was an integral factor for scheduling further treatment.  The following was addressed as part of his discharge planning and follow up treatment:  Employment, housing, transportation, bed availability, health status, family support, and any pending legal issues were also considered during his during the continuous assessment/observation.  He was offered further treatment options upon  discharge including but not limited to Residential, Intensive Outpatient, Outpatient treatment, Rehabilitation services, and resources for shelters and [facility] if needed.  Patient declined residential treatment.  States he feels like he would do best if he were to go to [facility].  States he is on a waiting list patient for a residential treatment program at this time.  He declined  residential treatment at this time.   Nathan Koch will follow up with the services as listed below under Follow up Information.     Upon completion of this admission the [name] was both mentally and medically stable for discharge denying suicidal/homicidal ideation, auditory/visual/tactile hallucinations, delusional thoughts and paranoia.     Total Time spent with patient: 20 minutes  Past Psychiatric History: Patient has a documented psychiatric history of methamphetamine use disorder, MDD, bipolar 1 disorder, depression pression, PTSD, and benzodiazepine abuse.  Past Medical History:  Past Medical History:  Diagnosis Date   Anxiety    Anxiety disorder    Asthma    Bipolar 1 disorder ([name])    Depression    Methamphetamine abuse ([name])    PTSD (post-traumatic stress disorder)     Past Surgical History:  Procedure Laterality Date   CLOSED REDUCTION MANDIBLE N/A 07/02/2018   Procedure: CLOSED REDUCTION MANDIBULAR;  Surgeon: Vernie Murders, MD;  Location: [facility] ORS;  Service: ENT;  Laterality: N/A;   Family History:  Family History  Problem Relation Age of Onset   Asthma Mother    Cancer Mother        breast cancer   Ulcers Mother    Cancer Father        esophogeal cancer   Hypertension Father    Asthma Brother    Family Psychiatric History: Unknown Social History: Social History   Substance and Sexual Activity  Alcohol Use Not Currently   Alcohol/week: 13.0 standard drinks   Types: 6 Cans of beer, 7 Standard drinks or equivalent per week   Comment: previously heavy drinker 2016. most recent 4 beer/ day drinker and none since 01-11-2020     Social History   Substance and Sexual Activity  Drug Use Yes   Types: Methamphetamines   Comment: states he uses 1 g every other day    Social History   Socioeconomic History   Marital status: Single    Spouse name: Not on file   Number of children: 1   Years of education: 10   Highest education level: 10th grade   Occupational History   Not on file  Tobacco Use   Smoking status: Every Day    Packs/day: 0.50    Years: 8.00    Pack years: 4.00    Types: Cigarettes   Smokeless tobacco: Never  Vaping Use   Vaping Use: Every day   Substances: Nicotine, Flavoring  Substance and Sexual Activity   Alcohol use: Not Currently    Alcohol/week: 13.0 standard drinks    Types: 6 Cans of beer, 7 Standard drinks or equivalent per week    Comment: previously heavy drinker 2016. most recent 4 beer/ day drinker and none since 01-11-2020   Drug use: Yes    Types: Methamphetamines    Comment: states he uses 1 g every other day   Sexual activity: Not on file  Other Topics Concern   Not on file  Social History Narrative   Not on file   Social Determinants of Health   Financial Resource Strain: Not on file  Food  Insecurity: Not on file  Transportation Needs: Not on file  Physical Activity: Not on file  Stress: Not on file  Social Connections: Not on file   SDOH:  SDOH Screenings   Alcohol Screen: Low Risk    Last Alcohol Screening Score (AUDIT): 1  Depression (PHQ2-9): Medium Risk   PHQ-2 Score: 9  Financial Resource Strain: Not on file  Food Insecurity: Not on file  Housing: Not on file  Physical Activity: Not on file  Social Connections: Not on file  Stress: Not on file  Tobacco Use: High Risk   Smoking Tobacco Use: Every Day   Smokeless Tobacco Use: Never   Passive Exposure: Not on file  Transportation Needs: Not on file    Tobacco Cessation:  N/A, patient does not currently use tobacco products  Current Medications:  Current Facility-Administered Medications  Medication Dose Route Frequency Provider Last Rate Last Admin   acetaminophen (TYLENOL) tablet 650 mg  650 mg Oral Q6H PRN White, Patrice L, NP       albuterol (VENTOLIN HFA) 108 (90 Base) MCG/ACT inhaler 2 puff  2 puff Inhalation Q4H PRN White, Patrice L, NP   2 puff at 02/13/21 1742   alum & mag hydroxide-simeth  (MAALOX/MYLANTA) 200-200-20 MG/5ML suspension 30 mL  30 mL Oral Q4H PRN White, Patrice L, NP       hydrOXYzine (ATARAX/VISTARIL) tablet 25 mg  25 mg Oral TID PRN White, Patrice L, NP   25 mg at 02/13/21 2103   magnesium hydroxide (MILK OF MAGNESIA) suspension 30 mL  30 mL Oral Daily PRN White, Patrice L, NP       QUEtiapine (SEROQUEL) tablet 50 mg  50 mg Oral QHS White, Patrice L, NP   50 mg at 02/13/21 2103   sertraline (ZOLOFT) tablet 50 mg  50 mg Oral Daily White, Patrice L, NP   50 mg at 02/14/21 6283   traZODone (DESYREL) tablet 50 mg  50 mg Oral QHS PRN White, Patrice L, NP   50 mg at 02/13/21 2103   Current Outpatient Medications  Medication Sig Dispense Refill   acetaminophen (TYLENOL) 500 MG tablet Take 1,000 mg by mouth every 6 (six) hours as needed for moderate pain or headache. (Patient not taking: Reported on 02/12/2021)     albuterol (VENTOLIN HFA) 108 (90 Base) MCG/ACT inhaler Inhale 2 puffs into the lungs every 4 (four) hours as needed for wheezing or shortness of breath. (Patient not taking: Reported on 02/12/2021) 6.7 g 1   QUEtiapine (SEROQUEL) 50 MG tablet Take 1 tablet (50 mg total) by mouth at bedtime. 30 tablet 0   sertraline (ZOLOFT) 50 MG tablet Take 1 tablet (50 mg total) by mouth at bedtime. 7 tablet 0    PTA Medications: (Not in a hospital admission)   Musculoskeletal  Strength & Muscle Tone: within normal limits Gait & Station: normal Patient leans: N/A  Psychiatric Specialty Exam  Presentation  General Appearance: Appropriate for Environment; Fairly Groomed; Surveyor, minerals and Coherent; Normal Rate  Speech Volume:Normal  Handedness:Right   Mood and Affect  Mood:Anxious  Affect:Congruent   Thought Process  Thought Processes:Coherent  Descriptions of Associations:Intact  Orientation:Full (Time, Place and Person)  Thought Content:Logical  Diagnosis of Schizophrenia or Schizoaffective disorder in past: No     Hallucinations:Hallucinations: None  Ideas of Reference:None  Suicidal Thoughts:Suicidal Thoughts: No SI Active Intent and/or Plan: With Plan  Homicidal Thoughts:Homicidal Thoughts: No   Sensorium  Memory:Immediate Good; Recent Good; Remote  Good  Judgment:Fair  Insight:Fair   Executive Functions  Concentration:Good  Attention Span:Good  Recall:Good  Fund of Knowledge:Good  Language:Good   Psychomotor Activity  Psychomotor Activity:Psychomotor Activity: Normal   Assets  Assets:Communication Skills; Desire for Improvement; Physical Health   Sleep  Sleep:Sleep: Good Number of Hours of Sleep: 8   No data recorded  Physical Exam  Physical Exam Vitals and nursing note reviewed.  Constitutional:      Appearance: Normal appearance. He is well-developed.  HENT:     Head: Normocephalic and atraumatic.     Right Ear: External ear normal.     Left Ear: External ear normal.  Eyes:     General:        Right eye: No discharge.        Left eye: No discharge.     Conjunctiva/sclera: Conjunctivae normal.  Cardiovascular:     Rate and Rhythm: Normal rate and regular rhythm.     Heart sounds: No murmur heard. Pulmonary:     Effort: Pulmonary effort is normal. No respiratory distress.     Breath sounds: Normal breath sounds.  Abdominal:     Palpations: Abdomen is soft.     Tenderness: There is no abdominal tenderness.  Musculoskeletal:        General: Normal range of motion.     Cervical back: Normal range of motion and neck supple.  Skin:    General: Skin is warm and dry.     Coloration: Skin is not jaundiced or pale.  Neurological:     Mental Status: He is alert and oriented to person, place, and time.  Psychiatric:        Attention and Perception: Attention and perception normal.        Mood and Affect: Mood is anxious.        Speech: Speech normal.        Behavior: Behavior normal. Behavior is cooperative.        Thought Content: Thought content normal.         Cognition and Memory: Cognition normal.        Judgment: Judgment is impulsive.   Review of Systems  Constitutional: Negative.  Negative for fever.  HENT: Negative.  Negative for hearing loss.   Eyes: Negative.   Respiratory: Negative.  Negative for cough.   Cardiovascular: Negative.  Negative for chest pain.  Musculoskeletal: Negative.   Skin: Negative.   Neurological: Negative.   Psychiatric/Behavioral:  The patient is nervous/anxious.   Blood pressure 96/66, pulse 78, temperature 98 F (36.7 C), temperature source Oral, resp. rate 18, SpO2 97 %. There is no height or weight on file to calculate BMI.  Demographic Factors:  Male, Low socioeconomic status, Living alone, and Unemployed  Loss Factors: Financial problems/change in socioeconomic status  Historical Factors: Impulsivity  Risk Reduction Factors:   Sense of responsibility to family, Positive social support, Positive therapeutic relationship, and Positive coping skills or problem solving skills  Continued Clinical Symptoms:  Severe Anxiety and/or Agitation Bipolar Disorder:   Depressive phase Depression:   Comorbid alcohol abuse/dependence Impulsivity Alcohol/Substance Abuse/Dependencies  Cognitive Features That Contribute To Risk:  None    Suicide Risk:  Minimal: No identifiable suicidal ideation.  Patients presenting with no risk factors but with morbid ruminations; may be classified as minimal risk based on the severity of the depressive symptoms  Plan Of Care/Follow-up recommendations:  Activity:  as tolerated  Diet:  regular  Disposition:   Discharge patient.  Provided outpatient  psychiatric resources for for [facility] behavioral health outpatient services on the second floor, including open access walk-in hours.  Provided resources for [facility] and wellness for primary care needs.  Resources provided for [facility], therapeutic alternatives, substance abuse resources for outpatient  and residential.  Patient was provided 1 bus pass and 1 part bus pass.  Patient wanted to be transported to obtain his paycheck and then to be transported to [facility] in [place].  No evidence of imminent risk to self or others at present.    Patient does not meet criteria for psychiatric inpatient admission. Discussed crisis plan, support from social network, calling 911, coming to the Emergency Department, and calling Suicide Hotline.   Ardis Hughs, NP 02/14/2021, 11:36 AM

## 2021-02-14 NOTE — Discharge Summary (Addendum)
[name] to be D/C'd Home per NP order. Discussed with the patient and all questions fully answered. An After Visit Summary was printed and given to the patient. Patient escorted out and D/C home via safe transport. [name]  02/14/2021 12:50 PM

## 2021-02-14 NOTE — ED Notes (Signed)
Patient given breakfast. 

## 2021-02-14 NOTE — Discharge Instructions (Addendum)

## 2021-02-14 NOTE — Progress Notes (Signed)
Pt is awake, alert and oriented. Pt complained of stomach discomfort. No signs of acute distress noted. Pt denies current SI/HI/AVH. Staff will monitor for pt's safety.

## 2021-02-17 ENCOUNTER — Other Ambulatory Visit: Payer: Self-pay

## 2021-02-17 DIAGNOSIS — M791 Myalgia, unspecified site: Secondary | ICD-10-CM | POA: Insufficient documentation

## 2021-02-17 DIAGNOSIS — R197 Diarrhea, unspecified: Secondary | ICD-10-CM | POA: Insufficient documentation

## 2021-02-17 DIAGNOSIS — Z5321 Procedure and treatment not carried out due to patient leaving prior to being seen by health care provider: Secondary | ICD-10-CM | POA: Insufficient documentation

## 2021-02-17 DIAGNOSIS — Z20822 Contact with and (suspected) exposure to covid-19: Secondary | ICD-10-CM | POA: Insufficient documentation

## 2021-02-17 DIAGNOSIS — R112 Nausea with vomiting, unspecified: Secondary | ICD-10-CM | POA: Insufficient documentation

## 2021-02-17 DIAGNOSIS — R109 Unspecified abdominal pain: Secondary | ICD-10-CM | POA: Insufficient documentation

## 2021-02-17 NOTE — ED Triage Notes (Addendum)
Pt comes with c/o via EMS of generalized body aches. Pt states he is just not feeling good. EMS reports VSS and that pt was requesting something to eat and some drink. Pt ambulatory with steady gait in waiting room  Pt does state N/V.D and some belly pain.

## 2021-02-18 ENCOUNTER — Emergency Department
Admission: EM | Admit: 2021-02-18 | Discharge: 2021-02-18 | Disposition: A | Discharge status: Home / Self Care | Payer: Self-pay | Attending: Emergency Medicine | Admitting: Emergency Medicine

## 2021-02-18 ENCOUNTER — Telehealth: Payer: Self-pay | Admitting: Emergency Medicine

## 2021-02-18 LAB — CBC WITH DIFFERENTIAL/PLATELET
Abs Immature Granulocytes: 0.04 10*3/uL (ref 0.00–0.07)
Basophils Absolute: 0.1 10*3/uL (ref 0.0–0.1)
Basophils Relative: 0 %
Eosinophils Absolute: 0 10*3/uL (ref 0.0–0.5)
Eosinophils Relative: 0 %
HCT: 46.8 % (ref 39.0–52.0)
Hemoglobin: 15.9 g/dL (ref 13.0–17.0)
Immature Granulocytes: 0 %
Lymphocytes Relative: 17 %
Lymphs Abs: 2.5 10*3/uL (ref 0.7–4.0)
MCH: 28.3 pg (ref 26.0–34.0)
MCHC: 34 g/dL (ref 30.0–36.0)
MCV: 83.4 fL (ref 80.0–100.0)
Monocytes Absolute: 1.3 10*3/uL — ABNORMAL HIGH (ref 0.1–1.0)
Monocytes Relative: 9 %
Neutro Abs: 11.1 10*3/uL — ABNORMAL HIGH (ref 1.7–7.7)
Neutrophils Relative %: 74 %
Platelets: 430 10*3/uL — ABNORMAL HIGH (ref 150–400)
RBC: 5.61 MIL/uL (ref 4.22–5.81)
RDW: 13.3 % (ref 11.5–15.5)
WBC: 15 10*3/uL — ABNORMAL HIGH (ref 4.0–10.5)
nRBC: 0 % (ref 0.0–0.2)

## 2021-02-18 LAB — COMPREHENSIVE METABOLIC PANEL
ALT: 24 U/L (ref 0–44)
AST: 52 U/L — ABNORMAL HIGH (ref 15–41)
Albumin: 5.2 g/dL — ABNORMAL HIGH (ref 3.5–5.0)
Alkaline Phosphatase: 83 U/L (ref 38–126)
Anion gap: 13 (ref 5–15)
BUN: 18 mg/dL (ref 6–20)
CO2: 24 mmol/L (ref 22–32)
Calcium: 10.3 mg/dL (ref 8.9–10.3)
Chloride: 99 mmol/L (ref 98–111)
Creatinine, Ser: 1.26 mg/dL — ABNORMAL HIGH (ref 0.61–1.24)
GFR, Estimated: >60 mL/min (ref >60)
Glucose, Bld: 139 mg/dL — ABNORMAL HIGH (ref 70–99)
Potassium: 3.8 mmol/L (ref 3.5–5.1)
Sodium: 136 mmol/L (ref 135–145)
Total Bilirubin: 1.9 mg/dL — ABNORMAL HIGH (ref 0.3–1.2)
Total Protein: 9.7 g/dL — ABNORMAL HIGH (ref 6.5–8.1)

## 2021-02-18 LAB — RESP PANEL BY RT-PCR (FLU A&B, COVID) ARPGX2
Influenza A by PCR: NEGATIVE
Influenza B by PCR: NEGATIVE
SARS Coronavirus 2 by RT PCR: NEGATIVE

## 2021-02-18 LAB — LIPASE, BLOOD: Lipase: 24 U/L (ref 11–51)

## 2021-02-18 NOTE — ED Notes (Signed)
No answer when called several times from lobby 

## 2021-02-22 ENCOUNTER — Emergency Department: Payer: Self-pay

## 2021-02-22 ENCOUNTER — Emergency Department
Admission: EM | Admit: 2021-02-22 | Discharge: 2021-02-22 | Disposition: A | Discharge status: Home / Self Care | Payer: Self-pay | Attending: Emergency Medicine | Admitting: Emergency Medicine

## 2021-02-22 ENCOUNTER — Encounter: Payer: Self-pay | Admitting: Emergency Medicine

## 2021-02-22 ENCOUNTER — Other Ambulatory Visit: Payer: Self-pay

## 2021-02-22 DIAGNOSIS — R410 Disorientation, unspecified: Secondary | ICD-10-CM | POA: Insufficient documentation

## 2021-02-22 DIAGNOSIS — J45909 Unspecified asthma, uncomplicated: Secondary | ICD-10-CM | POA: Insufficient documentation

## 2021-02-22 DIAGNOSIS — Y900 Blood alcohol level of less than 20 mg/100 ml: Secondary | ICD-10-CM | POA: Insufficient documentation

## 2021-02-22 DIAGNOSIS — R45851 Suicidal ideations: Secondary | ICD-10-CM | POA: Insufficient documentation

## 2021-02-22 DIAGNOSIS — Z79899 Other long term (current) drug therapy: Secondary | ICD-10-CM | POA: Insufficient documentation

## 2021-02-22 DIAGNOSIS — F17210 Nicotine dependence, cigarettes, uncomplicated: Secondary | ICD-10-CM | POA: Insufficient documentation

## 2021-02-22 DIAGNOSIS — Z91040 Latex allergy status: Secondary | ICD-10-CM | POA: Insufficient documentation

## 2021-02-22 DIAGNOSIS — M25551 Pain in right hip: Secondary | ICD-10-CM | POA: Insufficient documentation

## 2021-02-22 LAB — CBC
HCT: 41.4 % (ref 39.0–52.0)
Hemoglobin: 14.6 g/dL (ref 13.0–17.0)
MCH: 29.2 pg (ref 26.0–34.0)
MCHC: 35.3 g/dL (ref 30.0–36.0)
MCV: 82.8 fL (ref 80.0–100.0)
Platelets: 399 10*3/uL (ref 150–400)
RBC: 5 MIL/uL (ref 4.22–5.81)
RDW: 13.1 % (ref 11.5–15.5)
WBC: 11.4 10*3/uL — ABNORMAL HIGH (ref 4.0–10.5)
nRBC: 0 % (ref 0.0–0.2)

## 2021-02-22 LAB — COMPREHENSIVE METABOLIC PANEL
ALT: 34 U/L (ref 0–44)
AST: 51 U/L — ABNORMAL HIGH (ref 15–41)
Albumin: 4.7 g/dL (ref 3.5–5.0)
Alkaline Phosphatase: 70 U/L (ref 38–126)
Anion gap: 10 (ref 5–15)
BUN: 26 mg/dL — ABNORMAL HIGH (ref 6–20)
CO2: 30 mmol/L (ref 22–32)
Calcium: 9.7 mg/dL (ref 8.9–10.3)
Chloride: 93 mmol/L — ABNORMAL LOW (ref 98–111)
Creatinine, Ser: 0.95 mg/dL (ref 0.61–1.24)
GFR, Estimated: >60 mL/min (ref >60)
Glucose, Bld: 116 mg/dL — ABNORMAL HIGH (ref 70–99)
Potassium: 3.8 mmol/L (ref 3.5–5.1)
Sodium: 133 mmol/L — ABNORMAL LOW (ref 135–145)
Total Bilirubin: 2.6 mg/dL — ABNORMAL HIGH (ref 0.3–1.2)
Total Protein: 8.6 g/dL — ABNORMAL HIGH (ref 6.5–8.1)

## 2021-02-22 LAB — URINE DRUG SCREEN, QUALITATIVE (ARMC ONLY)
Amphetamines, Ur Screen: POSITIVE — AB
Barbiturates, Ur Screen: NOT DETECTED
Benzodiazepine, Ur Scrn: NOT DETECTED
Cannabinoid 50 Ng, Ur ~~LOC~~: POSITIVE — AB
Cocaine Metabolite,Ur ~~LOC~~: NOT DETECTED
MDMA (Ecstasy)Ur Screen: NOT DETECTED
Methadone Scn, Ur: NOT DETECTED
Opiate, Ur Screen: NOT DETECTED
Phencyclidine (PCP) Ur S: NOT DETECTED
Tricyclic, Ur Screen: NOT DETECTED

## 2021-02-22 LAB — ETHANOL: Alcohol, Ethyl (B): <10 mg/dL (ref <10)

## 2021-02-22 MED ORDER — NAPROXEN 500 MG PO TABS
500.0000 mg | ORAL_TABLET | Freq: Once | ORAL | Status: AC
  Administered 2021-02-22: 500 mg via ORAL
  Filled 2021-02-22: qty 1

## 2021-02-22 MED ORDER — ACETAMINOPHEN 500 MG PO TABS
1000.0000 mg | ORAL_TABLET | Freq: Once | ORAL | Status: AC
  Administered 2021-02-22: 1000 mg via ORAL
  Filled 2021-02-22: qty 2

## 2021-02-22 NOTE — ED Notes (Signed)
Pt was dc'd pt concern was that his scan came back ok with nothing wrong with his r leg.  walked out by rn without assistance gate steady.  Pt came back inside and was brought back to his room by triage RN. RN asked what happened to pt, he did not mention anything about want to harm himself. Pt said he didn't think anyone would believe him Stated "I got outside and knew I could not handle it mentally." MD came to bedside offered pt a place to sleep in the lobby without being bothered. Pt denied self harm intentions and walked back out to the lobby wo assistance.

## 2021-02-22 NOTE — ED Provider Notes (Signed)
[facility] Emergency Department Provider Note ____________________________________________   Event Date/Time   First MD Initiated Contact with Patient 02/22/21 1815     (approximate)  I have reviewed the triage vital signs and the nursing notes.  HISTORY  Chief Complaint Altered Mental Status   HPI Nathan Koch is a 30 y.o. malewho presents to the ED for evaluation of confusion.   Chart review indicates frequent ED visits, most often meth induced psychoses and suicidal ideations managed as an outpatient.  Patient presents to the ED via EMS from a [employer] where he was found poorly responsive.  No Narcan provided.  Presents to the ED alert and oriented, but sleepy.  Requesting food.  Reports that he is not sure what happened.  Reports he was just minding his own business.  He reports subacute right hip pain after a fall few weeks ago.  Reports he does snort methamphetamines, and denies IVDU.  Reports its "been a while" since his last methamphetamine use.  Denies additional recreational drugs.  Denies recent assault or injuries.  Denies fever or recent illnesses.  Past Medical History:  Diagnosis Date   Anxiety    Anxiety disorder    Asthma    Bipolar 1 disorder ([name])    Depression    Methamphetamine abuse ([name])    PTSD (post-traumatic stress disorder)     Patient Active Problem List   Diagnosis Date Noted   Severe recurrent major depression without psychotic features ([name]) 01/16/2021   Bipolar 1 disorder, depressed ([name])    MDD (major depressive disorder), recurrent episode, severe ([name]) 09/11/2020   MDD (major depressive disorder), recurrent severe, without psychosis ([name]) 09/10/2020   Depression, major, recurrent, 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   Methamphetamine use disorder, moderate ([name]) 09/14/2019   MDD (major depressive disorder), recurrent, severe, 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    Past Surgical History:  Procedure Laterality Date   CLOSED REDUCTION MANDIBLE N/A 07/02/2018   Procedure: CLOSED REDUCTION MANDIBULAR;  Surgeon: Vernie Murders, MD;  Location: [facility] ORS;  Service: ENT;  Laterality: N/A;    Prior to Admission medications   Medication Sig Start Date End Date Taking? Authorizing Provider  acetaminophen (TYLENOL) 500 MG tablet Take 1,000 mg by mouth every 6 (six) hours as needed for moderate pain or headache. Patient not taking: Reported on 02/12/2021    [provider]  albuterol (VENTOLIN HFA) 108 (90 Base) MCG/ACT inhaler Inhale 2 puffs into the lungs every 4 (four) hours as needed for wheezing or shortness of breath. Patient not taking: Reported on 02/12/2021 01/23/21   Jesse Sans, MD  QUEtiapine (SEROQUEL) 50 MG tablet Take 1 tablet (50 mg total) by mouth at bedtime. 02/05/21   Oneta Rack, NP  sertraline (ZOLOFT) 50 MG tablet Take 1 tablet (50 mg total) by mouth at bedtime. 02/05/21   Oneta Rack, NP    Allergies Latex  Family History  Problem Relation Age of Onset   Asthma Mother    Cancer Mother        breast cancer   Ulcers Mother    Cancer Father        esophogeal cancer   Hypertension Father    Asthma Brother     Social History Social History  Tobacco Use   Smoking status: Every Day    Packs/day: 0.50    Years: 8.00    Pack years: 4.00    Types: Cigarettes   Smokeless tobacco: Never  Vaping Use   Vaping Use: Every day   Substances: Nicotine, Flavoring  Substance Use Topics   Alcohol use: Not Currently    Alcohol/week: 13.0 standard drinks    Types: 6 Cans of beer, 7 Standard drinks or equivalent per week    Comment: previously heavy drinker 2016. most recent 4 beer/ day drinker and none since 01-11-2020   Drug  use: Yes    Types: Methamphetamines    Comment: states he uses 1 g every other day    Review of Systems  Constitutional: No fever/chills Eyes: No visual changes. ENT: No sore throat. Cardiovascular: Denies chest pain. Respiratory: Denies shortness of breath. Gastrointestinal: No abdominal pain.  No nausea, no vomiting.  No diarrhea.  No constipation. Genitourinary: Negative for dysuria. Musculoskeletal: Negative for back pain. Positive for subacute right hip pain. Skin: Negative for rash. Neurological: Negative for headaches, focal weakness or numbness.  ____________________________________________   PHYSICAL EXAM:  VITAL SIGNS: Vitals:   02/22/21 1626  BP: 123/76  Pulse: 90  Resp: 20  Temp: 98.6 F (37 C)  SpO2: 100%    Constitutional: Alert and oriented. Well appearing and in no acute distress. Able to get up on the side of the bed, ambulates with a slightly antalgic gait, complaining of his right hip. Eyes: Conjunctivae are normal. PERRL. EOMI. Head: Atraumatic. Nose: No congestion/rhinnorhea. Mouth/Throat: Mucous membranes are moist.  Oropharynx non-erythematous. Neck: No stridor. No cervical spine tenderness to palpation. Cardiovascular: Normal rate, regular rhythm. Grossly normal heart sounds.  Good peripheral circulation. Respiratory: Normal respiratory effort.  No retractions. Lungs CTAB. Gastrointestinal: Soft , nondistended, nontender to palpation. No CVA tenderness. Musculoskeletal: No lower extremity tenderness nor edema.  No joint effusions. No signs of acute trauma. Neurologic:  Normal speech and language. No gross focal neurologic deficits are appreciated. No gait instability noted. Skin:  Skin is warm, dry and intact. No rash noted. Psychiatric: Mood and affect are normal. Speech and behavior are normal. ____________________________________________   LABS (all labs ordered are listed, but only abnormal results are displayed)  Labs Reviewed   COMPREHENSIVE METABOLIC PANEL - Abnormal; Notable for the following components:      Result Value   Sodium 133 (*)    Chloride 93 (*)    Glucose, Bld 116 (*)    BUN 26 (*)    Total Protein 8.6 (*)    AST 51 (*)    Total Bilirubin 2.6 (*)    All other components within normal limits  CBC - Abnormal; Notable for the following components:   WBC 11.4 (*)    All other components within normal limits  URINE DRUG SCREEN, QUALITATIVE ([facility] ONLY) - Abnormal; Notable for the following components:   Amphetamines, Ur Screen POSITIVE (*)    Cannabinoid 50 Ng, Ur [place] POSITIVE (*)    All other components within normal limits  ETHANOL   ____________________________________________  12 Lead EKG   ____________________________________________  RADIOLOGY  ED MD interpretation: Plain film of the right hip and pelvis reviewed by me without evidence of acute bony injury. CT of the head reviewed by me without evidence of acute intracranial pathology.  Official radiology report(s): CT HEAD WO CONTRAST ( )  Result Date: 02/22/2021 CLINICAL DATA:  30 year old male with altered mental status. EXAM: CT HEAD WITHOUT CONTRAST  TECHNIQUE: Contiguous axial images were obtained from the base of the skull through the vertex without intravenous contrast. COMPARISON:  07/02/2018 CT FINDINGS: Brain: No evidence of acute infarction, hemorrhage, hydrocephalus, extra-axial collection or mass lesion/mass effect. Vascular: No hyperdense vessel or unexpected calcification. Skull: Normal. Negative for fracture or focal lesion. Sinuses/Orbits: No acute finding. Other: None. IMPRESSION: Unremarkable noncontrast head CT. Electronically Signed   By: Harmon Pier M.D.   On: 02/22/2021 20:15   DG Hip Unilat W or Wo Pelvis 2-3 Views Right  Result Date: 02/22/2021 CLINICAL DATA:  Found unresponsive. EXAM: DG HIP (WITH OR WITHOUT PELVIS) 2-3V RIGHT COMPARISON:  None. FINDINGS: There is no evidence of hip fracture or dislocation.  There is no evidence of arthropathy or other focal bone abnormality. IMPRESSION: Negative. Electronically Signed   By: Aram Candela M.D.   On: 02/22/2021 20:31    ____________________________________________   PROCEDURES and INTERVENTIONS  Procedure(s) performed (including Critical Care):  Procedures  Medications  acetaminophen (TYLENOL) tablet 1,000 mg (1,000 mg Oral Given 02/22/21 2030)  naproxen (NAPROSYN) tablet 500 mg (500 mg Oral Given 02/22/21 2030)    ____________________________________________   MDM / ED COURSE   30 year old undomiciled gentleman presents to the ED after being found unresponsive or asleep at a [employer], without evidence of acute pathology, and amenable to outpatient management.  Normal vitals on room air.  Benign examination without evidence of neurologic or vascular deficits.  No signs of acute trauma.  He is ambulatory.  Plain film without fracture and CT head without evidence of ICH or CNS pathology.  We will provide some food, Tylenol/NSAIDs and discharge.     ____________________________________________   FINAL CLINICAL IMPRESSION(S) / ED DIAGNOSES  Final diagnoses:  Confusion     ED Discharge Orders     None        Jariana Shumard   Note:  This document was prepared using Dragon voice recognition software and may include unintentional dictation errors.    Delton Prairie, MD 02/22/21 2151

## 2021-02-22 NOTE — ED Triage Notes (Addendum)
Pt via EMS from home. When asked the pt what he was here for pt states, "I went to sleep and woke up here" Per EMS, pt was found unresponsive at [facility]. Has a hx of drug and alcohol abuse. Denies any drug or alcohol abuse. Pt states last use was 2 weeks ago. Pt has a hx of meth use. Pt is A&Ox4 but lethargic.

## 2021-02-22 NOTE — Discharge Instructions (Signed)
Please take Tylenol and ibuprofen/Advil for your pain.  It is safe to take them together, or to alternate them every few hours.  Take up to 1000mg of Tylenol at a time, up to 4 times per day.  Do not take more than 4000 mg of Tylenol in 24 hours.  For ibuprofen, take 400-600 mg, 4-5 times per day. ° ° °

## 2021-03-04 DIAGNOSIS — F1594 Other stimulant use, unspecified with stimulant-induced mood disorder: Secondary | ICD-10-CM

## 2021-04-05 ENCOUNTER — Emergency Department (HOSPITAL_BASED_OUTPATIENT_CLINIC_OR_DEPARTMENT_OTHER)
Admission: EM | Admit: 2021-04-05 | Discharge: 2021-04-05 | Disposition: A | Discharge status: Home / Self Care | Payer: Self-pay | Attending: Emergency Medicine | Admitting: Emergency Medicine

## 2021-04-05 ENCOUNTER — Encounter (HOSPITAL_BASED_OUTPATIENT_CLINIC_OR_DEPARTMENT_OTHER): Payer: Self-pay

## 2021-04-05 ENCOUNTER — Other Ambulatory Visit: Payer: Self-pay

## 2021-04-05 DIAGNOSIS — R197 Diarrhea, unspecified: Secondary | ICD-10-CM

## 2021-04-05 DIAGNOSIS — F17210 Nicotine dependence, cigarettes, uncomplicated: Secondary | ICD-10-CM | POA: Insufficient documentation

## 2021-04-05 DIAGNOSIS — R1084 Generalized abdominal pain: Secondary | ICD-10-CM

## 2021-04-05 DIAGNOSIS — Z20822 Contact with and (suspected) exposure to covid-19: Secondary | ICD-10-CM | POA: Insufficient documentation

## 2021-04-05 DIAGNOSIS — Z91040 Latex allergy status: Secondary | ICD-10-CM | POA: Insufficient documentation

## 2021-04-05 DIAGNOSIS — J069 Acute upper respiratory infection, unspecified: Secondary | ICD-10-CM

## 2021-04-05 DIAGNOSIS — J45909 Unspecified asthma, uncomplicated: Secondary | ICD-10-CM | POA: Insufficient documentation

## 2021-04-05 LAB — RESP PANEL BY RT-PCR (FLU A&B, COVID) ARPGX2
Influenza A by PCR: NEGATIVE
Influenza B by PCR: NEGATIVE
SARS Coronavirus 2 by RT PCR: NEGATIVE

## 2021-04-05 LAB — CBC
HCT: 45.2 % (ref 39.0–52.0)
Hemoglobin: 14.6 g/dL (ref 13.0–17.0)
MCH: 27.8 pg (ref 26.0–34.0)
MCHC: 32.3 g/dL (ref 30.0–36.0)
MCV: 86.1 fL (ref 80.0–100.0)
Platelets: 324 10*3/uL (ref 150–400)
RBC: 5.25 MIL/uL (ref 4.22–5.81)
RDW: 13.9 % (ref 11.5–15.5)
WBC: 6.5 10*3/uL (ref 4.0–10.5)
nRBC: 0 % (ref 0.0–0.2)

## 2021-04-05 LAB — COMPREHENSIVE METABOLIC PANEL
ALT: 11 U/L (ref 0–44)
AST: 13 U/L — ABNORMAL LOW (ref 15–41)
Albumin: 4.6 g/dL (ref 3.5–5.0)
Alkaline Phosphatase: 65 U/L (ref 38–126)
Anion gap: 7 (ref 5–15)
BUN: 10 mg/dL (ref 6–20)
CO2: 28 mmol/L (ref 22–32)
Calcium: 9.9 mg/dL (ref 8.9–10.3)
Chloride: 105 mmol/L (ref 98–111)
Creatinine, Ser: 0.85 mg/dL (ref 0.61–1.24)
GFR, Estimated: >60 mL/min (ref >60)
Glucose, Bld: 88 mg/dL (ref 70–99)
Potassium: 3.8 mmol/L (ref 3.5–5.1)
Sodium: 140 mmol/L (ref 135–145)
Total Bilirubin: 0.8 mg/dL (ref 0.3–1.2)
Total Protein: 7.9 g/dL (ref 6.5–8.1)

## 2021-04-05 LAB — LIPASE, BLOOD: Lipase: 24 U/L (ref 11–51)

## 2021-04-05 MED ORDER — LOPERAMIDE HCL 2 MG PO CAPS: 2.0000 mg | ORAL_CAPSULE | Freq: Four times a day (QID) | ORAL | 0 refills | Status: DC | PRN

## 2021-04-05 NOTE — Discharge Instructions (Signed)
You were seen in the emergency department for evaluation of abdominal pain and diarrhea along with nasal congestion.  Your COVID and flu test were negative and your lab work was unremarkable.  Please use Tylenol and ibuprofen as needed for pain.  We are prescribing you some medication for the diarrhea.  Return to the emergency department if any worsening or concerning symptoms

## 2021-04-05 NOTE — ED Triage Notes (Signed)
Pt presents with multiple complaints. Pt reports lower abd pain x1 month, Right side and Right back pain x1 week. Pt also c/o dizziness, sneezing, congestion x4 days

## 2021-04-05 NOTE — ED Provider Notes (Signed)
[facility] EMERGENCY DEPT Provider Note   CSN: [id number] Arrival date & time: 04/05/21  1634     History Chief Complaint  Patient presents with   Abdominal Pain    Nathan Koch is a 30 y.o. male.  He is here with multiple complaints.  He said he has had diarrhea and abdominal cramping for about a month.  Associated with some back pain.  He is also had a few days of nasal congestion sneezing feeling dizzy and having some cough.  Denies any fever.  He does smoke cigarettes and has a history of drug use.  No vomiting.  Has tried nothing for his symptoms.  The history is provided by the patient.  Abdominal Pain Pain location:  RLQ Pain quality: cramping   Pain radiates to:  Does not radiate Pain severity:  Moderate Onset quality:  Gradual Duration:  4 weeks Timing:  Intermittent Progression:  Unchanged Chronicity:  New Context: not sick contacts and not trauma   Relieved by:  None tried Worsened by:  Nothing Ineffective treatments:  None tried Associated symptoms: cough and diarrhea   Associated symptoms: no chest pain, no constipation, no dysuria, no fever, no hematemesis, no hematochezia, no hematuria, no nausea, no shortness of breath, no sore throat and no vomiting       Past Medical History:  Diagnosis Date   Anxiety    Anxiety disorder    Asthma    Bipolar 1 disorder ([name])    Depression    Methamphetamine abuse ([name])    PTSD (post-traumatic stress disorder)     Patient Active Problem List   Diagnosis Date Noted   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   Severe recurrent major depression without psychotic features ([name]) 01/16/2021   Bipolar 1 disorder, depressed ([name])    MDD (major depressive disorder), recurrent episode, severe ([name]) 09/11/2020   MDD (major depressive disorder), recurrent severe, without psychosis ([name]) 09/10/2020   Depression, major, recurrent, 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   Methamphetamine use  disorder, moderate ([name]) 09/14/2019   MDD (major depressive disorder), recurrent, severe, with psychosis ([name]) 09/14/2019   MDD (major depressive disorder), single episode, severe with psychosis ([name]) 09/14/2019   Tobacco use disorder 02/21/2019   PTSD (post-traumatic stress disorder) 02/21/2019   Benzodiazepine abuse ([name]) 11/15/2017   Asthma 06/26/2014    Past Surgical History:  Procedure Laterality Date   CLOSED REDUCTION MANDIBLE N/A 07/02/2018   Procedure: CLOSED REDUCTION MANDIBULAR;  Surgeon: Vernie Murders, MD;  Location: [facility] ORS;  Service: ENT;  Laterality: N/A;       Family History  Problem Relation Age of Onset   Asthma Mother    Cancer Mother        breast cancer   Ulcers Mother    Cancer Father        esophogeal cancer   Hypertension Father    Asthma Brother     Social History   Tobacco Use   Smoking status: Every Day    Packs/day: 0.50    Years: 8.00    Pack years: 4.00    Types: Cigarettes   Smokeless tobacco: Never  Vaping Use   Vaping Use: Every day   Substances: Nicotine, Flavoring  Substance Use Topics   Alcohol use: Not Currently    Alcohol/week: 13.0 standard drinks    Types: 6 Cans of beer, 7 Standard drinks or equivalent per week    Comment: previously heavy drinker 2016. most recent 4 beer/ day drinker and none since  01-11-2020   Drug use: Yes    Types: Methamphetamines    Comment: states he uses 1 g every other day    Home Medications Prior to Admission medications   Medication Sig Start Date End Date Taking? Authorizing Provider  acetaminophen (TYLENOL) 500 MG tablet Take 1,000 mg by mouth every 6 (six) hours as needed for moderate pain or headache. Patient not taking: Reported on 02/12/2021    [provider]  albuterol (VENTOLIN HFA) 108 (90 Base) MCG/ACT inhaler Inhale 2 puffs into the lungs every 4 (four) hours as needed for wheezing or shortness of breath. Patient not taking: Reported on 02/12/2021 01/23/21   Salley Scarlet, MD  QUEtiapine (SEROQUEL) 50 MG tablet Take 1 tablet (50 mg total) by mouth at bedtime. 02/05/21   Derrill Center, NP  sertraline (ZOLOFT) 50 MG tablet Take 1 tablet (50 mg total) by mouth at bedtime. 02/05/21   Derrill Center, NP    Allergies    Latex  Review of Systems   Review of Systems  Constitutional:  Negative for fever.  HENT:  Positive for rhinorrhea and sneezing. Negative for sore throat.   Eyes:  Negative for visual disturbance.  Respiratory:  Positive for cough. Negative for shortness of breath.   Cardiovascular:  Negative for chest pain.  Gastrointestinal:  Positive for abdominal pain and diarrhea. Negative for constipation, hematemesis, hematochezia, nausea and vomiting.  Genitourinary:  Negative for dysuria and hematuria.  Musculoskeletal:  Positive for back pain.  Skin:  Negative for rash.  Neurological:  Negative for headaches.   Physical Exam Updated Vital Signs BP 133/70 (BP Location: Right Arm)   Pulse 73   Temp 97.9 F (36.6 C)   Resp 17   SpO2 100%   Physical Exam Vitals and nursing note reviewed.  Constitutional:      General: He is not in acute distress.    Appearance: Normal appearance. He is well-developed.  HENT:     Head: Normocephalic and atraumatic.     Right Ear: Tympanic membrane normal.     Left Ear: Tympanic membrane normal.     Nose: Rhinorrhea present.     Mouth/Throat:     Mouth: Mucous membranes are moist.     Pharynx: Oropharynx is clear.  Eyes:     Conjunctiva/sclera: Conjunctivae normal.  Cardiovascular:     Rate and Rhythm: Normal rate and regular rhythm.     Heart sounds: No murmur heard. Pulmonary:     Effort: Pulmonary effort is normal. No respiratory distress.     Breath sounds: Normal breath sounds.  Abdominal:     Palpations: Abdomen is soft.     Tenderness: There is no abdominal tenderness. There is no guarding or rebound.  Musculoskeletal:        General: No swelling. Normal range of motion.     Cervical  back: Neck supple.  Skin:    General: Skin is warm and dry.     Capillary Refill: Capillary refill takes less than 2 seconds.  Neurological:     General: No focal deficit present.     Mental Status: He is alert.     Gait: Gait normal.  Psychiatric:        Mood and Affect: Mood normal.    ED Results / Procedures / Treatments   Labs (all labs ordered are listed, but only abnormal results are displayed) Labs Reviewed  COMPREHENSIVE METABOLIC PANEL - Abnormal; Notable for the following components:  Result Value   AST 13 (*)    All other components within normal limits  RESP PANEL BY RT-PCR (FLU A&B, COVID) ARPGX2  LIPASE, BLOOD  CBC    EKG None  Radiology No results found.  Procedures Procedures   Medications Ordered in ED Medications - No data to display  ED Course  I have reviewed the triage vital signs and the nursing notes.  Pertinent labs & imaging results that were available during my care of the patient were reviewed by me and considered in my medical decision making (see chart for details).  Clinical Course as of 04/06/21 1002  Sat Apr 05, 2021  1719 Lab work unrevealing and patient appears very nontoxic.  Will recommend some Imodium for his diarrhea. [MB]    Clinical Course User Index [MB] Hayden Rasmussen, MD   MDM Rules/Calculators/A&P                          Nathan Koch was evaluated in Emergency Department on 04/05/2021 for the symptoms described in the history of present illness. He was evaluated in the context of the global COVID-19 pandemic, which necessitated consideration that the patient might be at risk for infection with the SARS-CoV-2 virus that causes COVID-19. Institutional protocols and algorithms that pertain to the evaluation of patients at risk for COVID-19 are in a state of rapid change based on information released by regulatory bodies including the CDC and federal and state organizations. These policies and algorithms were  followed during the patient's care in the ED.  This patient complains of right-sided abdominal pain for a month, right back pain for a week, URI symptoms ; this involves an extensive number of treatment Options and is a complaint that carries with it a high risk of complications and Morbidity. The differential includes cholelithiasis, cholecystitis, constipation, appendicitis, COVID, flu, viral syndrome  I ordered, reviewed and interpreted labs, which included CBC with normal white count normal hemoglobin, chemistries and LFTs normal, COVID and flu negative Previous records obtained and reviewed patient has multiple ED visits for confusion substance abuse SI.  After the interventions stated above, I reevaluated the patient and found patient to be asymptomatic with stable vitals.  Reviewed results of work-up with him.  He declines having any further testing at this time and wishes to be discharged.  Return instructions discussed.   Final Clinical Impression(s) / ED Diagnoses Final diagnoses:  Viral URI  Generalized abdominal pain  Diarrhea, unspecified type    Rx / DC Orders ED Discharge Orders     None        Hayden Rasmussen, MD 04/06/21 1005

## 2021-04-05 NOTE — ED Notes (Signed)
Pt ambulatory with steady gait to restroom, will provide urine specimen 

## 2021-04-05 NOTE — ED Notes (Signed)
RN provided AVS using Teachback Method. Patient verbalizes understanding of Discharge Instructions. Opportunity for Questioning and Answers were provided by RN. Patient Discharged from ED Patient is ambulatory out of department.  

## 2021-04-14 ENCOUNTER — Other Ambulatory Visit: Payer: Self-pay

## 2021-04-14 ENCOUNTER — Emergency Department (HOSPITAL_COMMUNITY)
Admission: EM | Admit: 2021-04-14 | Discharge: 2021-04-15 | Disposition: A | Discharge status: Home / Self Care | Payer: Self-pay | Attending: Emergency Medicine | Admitting: Emergency Medicine

## 2021-04-14 ENCOUNTER — Encounter (HOSPITAL_COMMUNITY): Payer: Self-pay | Admitting: Emergency Medicine

## 2021-04-14 DIAGNOSIS — Z20822 Contact with and (suspected) exposure to covid-19: Secondary | ICD-10-CM | POA: Insufficient documentation

## 2021-04-14 DIAGNOSIS — Y900 Blood alcohol level of less than 20 mg/100 ml: Secondary | ICD-10-CM | POA: Insufficient documentation

## 2021-04-14 DIAGNOSIS — F319 Bipolar disorder, unspecified: Secondary | ICD-10-CM | POA: Insufficient documentation

## 2021-04-14 DIAGNOSIS — J069 Acute upper respiratory infection, unspecified: Secondary | ICD-10-CM

## 2021-04-14 DIAGNOSIS — Z91040 Latex allergy status: Secondary | ICD-10-CM | POA: Insufficient documentation

## 2021-04-14 DIAGNOSIS — R45851 Suicidal ideations: Secondary | ICD-10-CM

## 2021-04-14 DIAGNOSIS — F332 Major depressive disorder, recurrent severe without psychotic features: Secondary | ICD-10-CM | POA: Insufficient documentation

## 2021-04-14 DIAGNOSIS — J45909 Unspecified asthma, uncomplicated: Secondary | ICD-10-CM | POA: Insufficient documentation

## 2021-04-14 DIAGNOSIS — F17210 Nicotine dependence, cigarettes, uncomplicated: Secondary | ICD-10-CM | POA: Insufficient documentation

## 2021-04-14 LAB — CBC
HCT: 46.9 % (ref 39.0–52.0)
Hemoglobin: 15.7 g/dL (ref 13.0–17.0)
MCH: 29.3 pg (ref 26.0–34.0)
MCHC: 33.5 g/dL (ref 30.0–36.0)
MCV: 87.7 fL (ref 80.0–100.0)
Platelets: 481 10*3/uL — ABNORMAL HIGH (ref 150–400)
RBC: 5.35 MIL/uL (ref 4.22–5.81)
RDW: 13.5 % (ref 11.5–15.5)
WBC: 12.6 10*3/uL — ABNORMAL HIGH (ref 4.0–10.5)
nRBC: 0 % (ref 0.0–0.2)

## 2021-04-14 LAB — RAPID URINE DRUG SCREEN, HOSP PERFORMED
Amphetamines: POSITIVE — AB
Barbiturates: NOT DETECTED
Benzodiazepines: NOT DETECTED
Cocaine: NOT DETECTED
Opiates: NOT DETECTED
Tetrahydrocannabinol: NOT DETECTED

## 2021-04-14 LAB — COMPREHENSIVE METABOLIC PANEL
ALT: 46 U/L — ABNORMAL HIGH (ref 0–44)
AST: 47 U/L — ABNORMAL HIGH (ref 15–41)
Albumin: 4.4 g/dL (ref 3.5–5.0)
Alkaline Phosphatase: 71 U/L (ref 38–126)
Anion gap: 12 (ref 5–15)
BUN: 14 mg/dL (ref 6–20)
CO2: 28 mmol/L (ref 22–32)
Calcium: 9.7 mg/dL (ref 8.9–10.3)
Chloride: 94 mmol/L — ABNORMAL LOW (ref 98–111)
Creatinine, Ser: 0.77 mg/dL (ref 0.61–1.24)
GFR, Estimated: >60 mL/min (ref >60)
Glucose, Bld: 119 mg/dL — ABNORMAL HIGH (ref 70–99)
Potassium: 3.6 mmol/L (ref 3.5–5.1)
Sodium: 134 mmol/L — ABNORMAL LOW (ref 135–145)
Total Bilirubin: 1 mg/dL (ref 0.3–1.2)
Total Protein: 8.8 g/dL — ABNORMAL HIGH (ref 6.5–8.1)

## 2021-04-14 LAB — SALICYLATE LEVEL: Salicylate Lvl: <7 mg/dL — ABNORMAL LOW (ref 7.0–30.0)

## 2021-04-14 LAB — RESP PANEL BY RT-PCR (FLU A&B, COVID) ARPGX2
Influenza A by PCR: NEGATIVE
Influenza B by PCR: NEGATIVE
SARS Coronavirus 2 by RT PCR: NEGATIVE

## 2021-04-14 LAB — ACETAMINOPHEN LEVEL: Acetaminophen (Tylenol), Serum: <10 ug/mL — ABNORMAL LOW (ref 10–30)

## 2021-04-14 LAB — ETHANOL: Alcohol, Ethyl (B): <10 mg/dL (ref <10)

## 2021-04-14 NOTE — ED Notes (Signed)
Pt wanded by security. Knife and lighter removed from pt and given to security.

## 2021-04-14 NOTE — ED Notes (Signed)
Pt changed out in scrubs and urine specimen collected. Belongings which includes clothes, shoes, wallet, and cell phone secured in labeled bag and put up. Pt is now resting.

## 2021-04-14 NOTE — ED Provider Notes (Signed)
[facility] EMERGENCY DEPARTMENT Provider Note   CSN: [id number] Arrival date & time: 04/14/21  [id number]     History Chief Complaint  Patient presents with   V70.1    Nathan Koch is a 30 y.o. male past medical history significant for bipolar, methamphetamine use who presents for evaluation of feeling unwell and SI.  Patient states he has had 1 week of cough, congestion, rhinorrhea.  No fever, emesis, chest pain, shortness of breath, abdominal pain.  States he also has intermittent SI without plan.  States typically thoughts are worsened when he does methamphetamine, last use 3 days ago.  He denies any EtOH use.  Denies HI, AVH.  Denies additional aggravating or relieving factors. States he has been inpatient previous for SI and depression.  History obtained from patient and past medical records.  No interpreter is used.  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   Severe recurrent major depression without psychotic features ([place]) 01/16/2021   Bipolar 1 disorder, depressed ([place])    MDD (major depressive disorder), recurrent episode, severe ([place]) 09/11/2020   MDD (major depressive disorder), recurrent severe, without psychosis ([place]) 09/10/2020   Depression, major, recurrent, severe with psychosis ([place]) 10/10/2019   Methamphetamine use disorder, moderate ([place]) 09/14/2019   MDD (major depressive disorder), recurrent, severe, 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/14/2019   MDD (major depressive disorder), single episode, severe with psychosis ([place]) 09/14/2019   Tobacco use disorder 02/21/2019   PTSD (post-traumatic stress disorder) 02/21/2019   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/15/2017   Asthma 06/26/2014    Past Surgical  History:  Procedure Laterality Date   CLOSED REDUCTION MANDIBLE N/A 07/02/2018   Procedure: CLOSED REDUCTION MANDIBULAR;  Surgeon: Margaretha Sheffield, MD;  Location: [facility] ORS;  Service: ENT;  Laterality: N/A;       Family History  Problem Relation Age of Onset   Asthma Mother    Cancer Mother        breast cancer   Ulcers Mother    Cancer Father        esophogeal cancer   Hypertension Father    Asthma Brother     Social History   Tobacco Use   Smoking status: Every Day    Packs/day: 0.50    Years: 8.00    Pack years: 4.00    Types: Cigarettes   Smokeless tobacco: Never  Vaping Use   Vaping Use: Every day   Substances: Nicotine, Flavoring  Substance Use Topics   Alcohol use: Not Currently    Alcohol/week: 13.0 standard drinks    Types: 6 Cans of beer, 7 Standard drinks or equivalent per week    Comment: previously heavy drinker 2016. most recent 4 beer/ day drinker and none since 01-11-2020   Drug use: Yes    Types: Methamphetamines    Comment: states he uses 1 g every other day    Home Medications Prior to Admission medications   Medication Sig Start Date End Date Taking? Authorizing Provider  acetaminophen (TYLENOL) 500 MG tablet Take 1,000 mg by mouth every 6 (six) hours as needed for moderate pain or headache. Patient not taking: Reported on 02/12/2021  [provider]  albuterol (VENTOLIN HFA) 108 (90 Base) MCG/ACT inhaler Inhale 2 puffs into the lungs every 4 (four) hours as needed for wheezing or shortness of breath. Patient not taking: Reported on 02/12/2021 01/23/21   Salley Scarlet, MD  loperamide (IMODIUM) 2 MG capsule Take 1 capsule (2 mg total) by mouth 4 (four) times daily as needed for diarrhea or loose stools. 04/05/21   Hayden Rasmussen, MD  QUEtiapine (SEROQUEL) 50 MG tablet Take 1 tablet (50 mg total) by mouth at bedtime. 02/05/21   Derrill Center, NP  sertraline (ZOLOFT) 50 MG tablet Take 1 tablet (50 mg total) by mouth at bedtime. 02/05/21    Derrill Center, NP    Allergies    Latex  Review of Systems   Review of Systems  Constitutional:  Positive for chills and fever.  HENT:  Positive for congestion and rhinorrhea.   Respiratory:  Positive for cough. Negative for apnea, choking, chest tightness, shortness of breath, wheezing and stridor.   Cardiovascular: Negative.   Gastrointestinal: Negative.   Genitourinary: Negative.   Musculoskeletal:  Positive for myalgias.  Skin: Negative.   Neurological: Negative.   Psychiatric/Behavioral:  Positive for suicidal ideas.   All other systems reviewed and are negative.  Physical Exam Updated Vital Signs BP (!) 143/73 (BP Location: Right Arm)    Pulse (!) 103    Temp 98.6 F (37 C) (Oral)    Resp 17    Ht 6' (1.829 m)    Wt 95.3 kg    SpO2 100%    BMI 28.48 kg/m   Physical Exam Vitals and nursing note reviewed.  Constitutional:      General: He is not in acute distress.    Appearance: He is well-developed. He is not ill-appearing, toxic-appearing or diaphoretic.     Comments: Eating on initial evaluation  HENT:     Head: Atraumatic.     Nose: Congestion and rhinorrhea present.  Eyes:     Pupils: Pupils are equal, round, and reactive to light.  Cardiovascular:     Rate and Rhythm: Normal rate and regular rhythm.  Pulmonary:     Effort: Pulmonary effort is normal. No respiratory distress.     Breath sounds: Normal breath sounds.  Abdominal:     General: Bowel sounds are normal. There is no distension.     Palpations: Abdomen is soft.  Musculoskeletal:        General: Normal range of motion.     Cervical back: Normal range of motion and neck supple.  Skin:    General: Skin is warm and dry.     Capillary Refill: Capillary refill takes less than 2 seconds.  Neurological:     General: No focal deficit present.     Mental Status: He is alert and oriented to person, place, and time.  Psychiatric:        Attention and Perception: He is attentive. He does not perceive  auditory or visual hallucinations.        Mood and Affect: Affect is flat.        Thought Content: Thought content is not paranoid or delusional. Thought content includes suicidal ideation. Thought content does not include homicidal ideation. Thought content does not include homicidal or suicidal plan.        Cognition and Memory: Memory is not impaired.     Comments: Flat affect admits to SI without plan. Denies HI, AVH    ED Results / Procedures /  Treatments   Labs (all labs ordered are listed, but only abnormal results are displayed) Labs Reviewed  COMPREHENSIVE METABOLIC PANEL - Abnormal; Notable for the following components:      Result Value   Sodium 134 (*)    Chloride 94 (*)    Glucose, Bld 119 (*)    Total Protein 8.8 (*)    AST 47 (*)    ALT 46 (*)    All other components within normal limits  SALICYLATE LEVEL - Abnormal; Notable for the following components:   Salicylate Lvl <7.0 (*)    All other components within normal limits  ACETAMINOPHEN LEVEL - Abnormal; Notable for the following components:   Acetaminophen (Tylenol), Serum <10 (*)    All other components within normal limits  CBC - Abnormal; Notable for the following components:   WBC 12.6 (*)    Platelets 481 (*)    All other components within normal limits  RAPID URINE DRUG SCREEN, HOSP PERFORMED - Abnormal; Notable for the following components:   Amphetamines POSITIVE (*)    All other components within normal limits  RESP PANEL BY RT-PCR (FLU A&B, COVID) [id number]  ETHANOL    EKG None  Radiology No results found.  Procedures Procedures   Medications Ordered in ED Medications - No data to display  ED Course  I have reviewed the triage vital signs and the nursing notes.  Pertinent labs & imaging results that were available during my care of the patient were reviewed by me and considered in my medical decision making (see chart for details).  30 year old here for evaluation multiple complaints.   Patient upper respiratory symptoms.  His heart and lungs are clear.  Abdomen soft, nontender.  Also with SI without plan.  Per epic review seen fairly frequently for URI symptoms as well as SI, methamphetamine use. Plan on labs, imaging and reassess.  Labs personally reviewed and interpreted, similar to prior.  Suspect patient with viral URI, COVID, flu pending however will not change disposition.  Patient medically cleared, disposition per psychiatry      MDM Rules/Calculators/A&P                             Final Clinical Impression(s) / ED Diagnoses Final diagnoses:  Viral URI  Suicidal ideation    Rx / DC Orders ED Discharge Orders     None        Dennisha Mouser A, PA-C 04/14/21 2144    Bethann Berkshire, MD 04/15/21 1230

## 2021-04-14 NOTE — ED Triage Notes (Addendum)
Pt states he is suicidal as well as has fever, cough, and body aches. Pt states he is suicidal because he relapsed on meth. Last use was "a week ago".

## 2021-04-15 ENCOUNTER — Encounter (HOSPITAL_COMMUNITY): Payer: Self-pay | Admitting: Student

## 2021-04-15 ENCOUNTER — Other Ambulatory Visit (HOSPITAL_COMMUNITY)
Admission: EM | Admit: 2021-04-15 | Discharge: 2021-04-22 | Disposition: A | Discharge status: Home / Self Care | Payer: No Payment, Other | Attending: Psychiatry | Admitting: Psychiatry

## 2021-04-15 DIAGNOSIS — F4310 Post-traumatic stress disorder, unspecified: Secondary | ICD-10-CM | POA: Diagnosis present

## 2021-04-15 DIAGNOSIS — F332 Major depressive disorder, recurrent severe without psychotic features: Secondary | ICD-10-CM

## 2021-04-15 DIAGNOSIS — F17210 Nicotine dependence, cigarettes, uncomplicated: Secondary | ICD-10-CM | POA: Insufficient documentation

## 2021-04-15 DIAGNOSIS — Z79899 Other long term (current) drug therapy: Secondary | ICD-10-CM | POA: Insufficient documentation

## 2021-04-15 DIAGNOSIS — F1994 Other psychoactive substance use, unspecified with psychoactive substance-induced mood disorder: Secondary | ICD-10-CM | POA: Diagnosis present

## 2021-04-15 DIAGNOSIS — F1520 Other stimulant dependence, uncomplicated: Secondary | ICD-10-CM

## 2021-04-15 DIAGNOSIS — R45851 Suicidal ideations: Secondary | ICD-10-CM

## 2021-04-15 DIAGNOSIS — Z5900 Homelessness unspecified: Secondary | ICD-10-CM

## 2021-04-15 DIAGNOSIS — F1594 Other stimulant use, unspecified with stimulant-induced mood disorder: Secondary | ICD-10-CM | POA: Diagnosis present

## 2021-04-15 MED ORDER — ACETAMINOPHEN 325 MG PO TABS: 650.0000 mg | ORAL_TABLET | Freq: Four times a day (QID) | ORAL | Status: DC | PRN

## 2021-04-15 MED ORDER — HYDROXYZINE HCL 25 MG PO TABS
25.0000 mg | ORAL_TABLET | Freq: Three times a day (TID) | ORAL | Status: DC | PRN
  Administered 2021-04-15: 23:00:00 25 mg via ORAL
  Filled 2021-04-15: qty 1

## 2021-04-15 MED ORDER — MAGNESIUM HYDROXIDE 400 MG/5ML PO SUSP: 30.0000 mL | Freq: Every day | ORAL | Status: DC | PRN

## 2021-04-15 MED ORDER — TRAZODONE HCL 50 MG PO TABS
50.0000 mg | ORAL_TABLET | Freq: Every evening | ORAL | Status: DC | PRN
  Administered 2021-04-15 – 2021-04-17 (×3): 50 mg via ORAL
  Filled 2021-04-15 (×3): qty 1

## 2021-04-15 MED ORDER — SERTRALINE HCL 50 MG PO TABS: 50.0000 mg | ORAL_TABLET | Freq: Every day | ORAL | Status: DC

## 2021-04-15 MED ORDER — ALUM & MAG HYDROXIDE-SIMETH 200-200-20 MG/5ML PO SUSP: 30.0000 mL | ORAL | Status: DC | PRN

## 2021-04-15 MED ORDER — HYDROXYZINE HCL 25 MG PO TABS: 25.0000 mg | ORAL_TABLET | Freq: Three times a day (TID) | ORAL | Status: DC | PRN

## 2021-04-15 MED ORDER — SERTRALINE HCL 50 MG PO TABS
50.0000 mg | ORAL_TABLET | Freq: Every day | ORAL | Status: DC
  Administered 2021-04-15 – 2021-04-21 (×7): 50 mg via ORAL
  Filled 2021-04-15 (×2): qty 1
  Filled 2021-04-15: qty 21
  Filled 2021-04-15 (×5): qty 1

## 2021-04-15 MED ORDER — ALBUTEROL SULFATE HFA 108 (90 BASE) MCG/ACT IN AERS
2.0000 | INHALATION_SPRAY | RESPIRATORY_TRACT | Status: DC | PRN
  Administered 2021-04-19 – 2021-04-21 (×5): 2 via RESPIRATORY_TRACT
  Filled 2021-04-15: qty 8

## 2021-04-15 MED ORDER — TRAZODONE HCL 50 MG PO TABS: 50.0000 mg | ORAL_TABLET | Freq: Every day | ORAL | Status: DC

## 2021-04-15 NOTE — ED Notes (Signed)
Pt has spoken to TTS

## 2021-04-15 NOTE — ED Provider Notes (Signed)
Behavioral Health Admission [facility]&P [facility] OBS)  Date: 04/15/21 Patient Name: Nathan Koch MRN: [id number] Chief Complaint:  Chief Complaint  Patient presents with   Suicidal      Diagnoses:  Final diagnoses:  Severe episode of recurrent major depressive disorder, without psychotic features ([name])  Methamphetamine use disorder, moderate ([name])  Suicidal ideation    HPI: Nathan Koch is a 30 year old male with documented past psychiatric history significant for MDD, substance-induced mood disorder, methamphetamine use disorder, bipolar 1 disorder, PTSD, amphetamine induced mood disorder, tobacco use disorder, and benzodiazepine abuse, as well as no apparent documented significant past medical history, who presents to the [facility] behavioral health urgent care [facility]) as a voluntary direct admit transfer from [facility] emergency department.  Per chart review, patient presented to [facility] emergency department on 04/14/2021 for upper respiratory symptoms and SI.  Patient was medically cleared in the emergency department (patient's COVID test was negative and patient was diagnosed with a viral upper respiratory infection), patient was evaluated by psychiatry on 04/15/2021 in the emergency department and it was recommended by Laveda Abbe, NP for the patient to be transferred to the [facility] for continuous assessment.  Patient was also restarted on trazodone 50 mg at bedtime as needed for sleep, Vistaril 25 mg p.o. 3 times daily as needed for anxiety, and sertraline 50 mg p.o. daily at bedtime by psychiatry in the emergency department prior to transfer to the [facility] (Per chart review tomorrow, it does not appear that patient received any of these medications while in the ED).   Patient evaluated by this provider at the [facility] after his arrival from the emergency department.  On exam, patient reports that he presented to the [facility] emergency department on 04/14/2021 because he was  experiencing active suicidal ideation with plan to overdose on Seroquel.  Patient also reports that he went to the emergency department on 04/14/2021 because he has been experiencing upper respiratory symptoms for the past 3 days.  Patient reports that he has been experiencing nonproductive cough, congestion, fatigue, rhinorrhea, and headache for the past 3 days.  He denies any additional physical symptoms at this time.  Patient endorses active SI currently on exam with no associated suicidal plan but with associated intent to attempt suicide at this time.  He reports that he last experienced SI on 04/14/2021 when he had SI with plan to overdose on Seroquel at that time.  He endorses history of a suicide attempt from 2 months ago in which patient reports that he attempted suicide by cutting his left forearm with a razor blade at that time.  He denies history of intentionally burning himself.  He denies homicidal ideations.  He denies AVH.  He denies paranoia.  Patient describes his sleep as poor, about 2 hours per night.  He endorses feelings of anhedonia and feelings of guilt, hopelessness, and worthlessness over the past few months.  He reports that his energy and concentration have been "really low" over the past 2 to 3 months.  Patient reports that his appetite has been increased recently and that he is hungry but he feels like he can't eat (patient does not provide further details regarding this statement).  He is unsure if he has had any significant weight changes over the past few months.  Patient denies taking any psychotropic medications or any home medications at this time.  He denies having a psychiatrist or therapist at this time.  Per chart review, patient was psychiatrically  hospitalized at [facility] from 01/16/2021 to 01/24/2021 and was discharged on Vistaril 25 mg p.o. 3 times daily as needed for anxiety, Seroquel 150 mg p.o. at bedtime, Zoloft 50 mg p.o. daily at bedtime, and trazodone 25 mg p.o. at  bedtime as needed.  Chart review also shows that patient has been admitted to the [facility] behavioral health urgent care continuous assessment unit multiple times between patient's September 2022 inpatient psychiatric admission at [facility] and now.  Patient denies any additional inpatient psychiatric admissions since the September 2022 admission noted above.  Patient reports that he is homeless at this time and has been homeless for the past week.  Patient denies access to firearms.  Patient denies alcohol use.  Patient endorses vaping nicotine daily.  He endorses smoking 2 cigarettes/day.  Patient reports that he relapsed on methamphetamine 3 to 4 days ago when he reports that since his relapse, he has been smoking 1 g of methamphetamine per day.  However, he then reports that his last use of meth was 1 g of methamphetamine 3 to 4 days ago.  Patient reports that prior to his relapse 3 to 4 days ago, he had been completely sober from methamphetamine for 42 days.  Patient denies use of any additional substances at this time.  Patient reports that he is currently employed at [employer].  On exam, patient is sitting in no acute distress.  Eye contact is good.  Appearance is disheveled.  Speech is clear and coherent with normal rate and speech volume.  Mood is depressed and hopeless with congruent affect.  Thought process is coherent, goal directed, and linear.  Patient is alert and oriented x4, cooperative, and answers all questions appropriately during the evaluation.  No indication that patient is responding to internal/external stimuli on exam.  No delusional thought content noted on exam.  PHQ 2-9:  Flowsheet Row ED from 04/14/2021 in [facility] EMERGENCY DEPARTMENT ED from 02/05/2021 in [facility] ED from 01/14/2021 in [facility] EMERGENCY DEPARTMENT  Thoughts that you would be better off dead, or of hurting yourself in some way More than half the days More than half the days  More than half the days  PHQ-9 Total Score 16 9 6        Flowsheet Row ED from 04/15/2021 in [facility] ED from 04/14/2021 in [facility] EMERGENCY DEPARTMENT ED from 04/05/2021 in [facility] Emergency Dept  C-SSRS RISK CATEGORY High Risk High Risk No Risk        Total Time spent with patient: 30 minutes  Musculoskeletal  Strength & Muscle Tone: within normal limits Gait & Station: normal Patient leans: N/A  Psychiatric Specialty Exam  Presentation General Appearance: Disheveled  Eye Contact:Good  Speech:Clear and Coherent; Normal Rate  Speech Volume:Normal  Handedness:Right   Mood and Affect  Mood:Depressed; Hopeless  Affect:Congruent   Thought Process  Thought Processes:Coherent; Goal Directed; Linear  Descriptions of Associations:Intact  Orientation:Full (Time, Place and Person)  Thought Content:Logical; WDL  Diagnosis of Schizophrenia or Schizoaffective disorder in past: No  Duration of Psychotic Symptoms: Less than six months  Hallucinations:Hallucinations: None  Ideas of Reference:None  Suicidal Thoughts:Suicidal Thoughts: Yes, Active SI Active Intent and/or Plan: With Intent; Without Plan; With Means to Carry Out; Without Access to Means SI Passive Intent and/or Plan: Without Intent; Without Plan  Homicidal Thoughts:Homicidal Thoughts: No   Sensorium  Memory:Immediate Fair; Recent Fair; Remote Fair  Judgment:Fair  Insight:Present   Executive Functions  Concentration:Good  Attention Span:Good  Recall:Good  Fund of Knowledge:Good  Language:Good   Psychomotor Activity  Psychomotor Activity:Psychomotor Activity: Normal   Assets  Assets:Communication Skills; Desire for Improvement; Leisure Time; Physical Health; Resilience   Sleep  Sleep:Sleep: Poor Number of Hours of Sleep: 2   Nutritional Assessment (For OBS and FBC admissions only) Has the patient had a weight loss or gain of 10  pounds or more in the last 3 months?: -- (Patient is unsure.) Has the patient had a decrease in food intake/or appetite?: No (Patient reports that he has an increased appetite, but has not been able to eat for the past few dyas, but does not provide further details regarding this.) Does the patient have dental problems?: No Does the patient have eating habits or behaviors that may be indicators of an eating disorder including binging or inducing vomiting?: No Has the patient recently lost weight without trying?: 2.0 Has the patient been eating poorly because of a decreased appetite?: 0 (Patient reports that he has an increased appetite, but has not been able to eat for the past few dyas, but does not provide further details regarding this.) Malnutrition Screening Tool Score: 2 Nutritional Assessment Referrals: Refer to Primary Care Provider    Physical Exam Vitals reviewed.  Constitutional:      General: He is not in acute distress.    Appearance: He is not ill-appearing, toxic-appearing or diaphoretic.  HENT:     Head: Normocephalic and atraumatic.     Right Ear: External ear normal.     Left Ear: External ear normal.     Nose: Nose normal.  Eyes:     General:        Right eye: No discharge.        Left eye: No discharge.     Conjunctiva/sclera: Conjunctivae normal.  Cardiovascular:     Rate and Rhythm: Normal rate.  Pulmonary:     Effort: Pulmonary effort is normal. No respiratory distress.  Musculoskeletal:        General: Normal range of motion.     Cervical back: Normal range of motion.  Skin:    Comments: Multiple hypopigmented scarlike lesions noted on patient's left forearm with no signs of active bleeding, drainage, inflammation, or infection noted.  Neurological:     General: No focal deficit present.     Mental Status: He is alert and oriented to person, place, and time.     Comments: No tremor noted.   Psychiatric:        Attention and Perception: Attention and  perception normal. He does not perceive auditory or visual hallucinations.        Speech: Speech normal.        Behavior: Behavior normal. Behavior is not agitated, slowed, aggressive, hyperactive or combative. Behavior is cooperative.        Thought Content: Thought content is not paranoid or delusional. Thought content includes suicidal ideation. Thought content does not include homicidal ideation. Thought content does not include suicidal plan.     Comments: Mood is depressed and hopeless with congruent affect.    Review of Systems  Constitutional:  Positive for malaise/fatigue. Negative for chills, diaphoresis and fever.       Patient unsure about recent weight changes.  HENT:  Positive for congestion.        + for rhinorrhea  Respiratory:  Positive for cough. Negative for shortness of breath.   Cardiovascular:  Negative for chest pain and palpitations.  Gastrointestinal:  Negative for  abdominal pain, constipation, diarrhea, nausea and vomiting.  Musculoskeletal:  Negative for joint pain and myalgias.  Neurological:  Positive for headaches. Negative for dizziness.  Psychiatric/Behavioral:  Positive for depression, substance abuse and suicidal ideas. Negative for hallucinations and memory loss. The patient has insomnia. The patient is not nervous/anxious.   All other systems reviewed and are negative. Patient reports that he has been experiencing nonproductive cough, congestion, fatigue, rhinorrhea, and headache for the past 3 days.  He denies any additional physical symptoms at this time.   Vitals: Blood pressure 132/72, pulse 85, temperature 97.9 F (36.6 C), temperature source Oral, resp. rate 16, SpO2 100 %. There is no height or weight on file to calculate BMI.  Past Psychiatric History: documented past psychiatric history significant for MDD, substance-induced mood disorder, methamphetamine use disorder, bipolar 1 disorder, PTSD, amphetamine induced mood disorder, tobacco use  disorder, and benzodiazepine abuse.  Please see HPI for further details regarding patient's past psychiatric history.  Is the patient at risk to self? Yes  Has the patient been a risk to self in the past 6 months? Yes .    Has the patient been a risk to self within the distant past? Yes   Is the patient a risk to others? No   Has the patient been a risk to others in the past 6 months? No   Has the patient been a risk to others within the distant past? No   Past Medical History:  Past Medical History:  Diagnosis Date   Anxiety    Anxiety disorder    Asthma    Bipolar 1 disorder ([name])    Depression    Methamphetamine abuse ([name])    PTSD (post-traumatic stress disorder)     Past Surgical History:  Procedure Laterality Date   CLOSED REDUCTION MANDIBLE N/A 07/02/2018   Procedure: CLOSED REDUCTION MANDIBULAR;  Surgeon: Vernie Murders, MD;  Location: [facility] ORS;  Service: ENT;  Laterality: N/A;    Family History:  Family History  Problem Relation Age of Onset   Asthma Mother    Cancer Mother        breast cancer   Ulcers Mother    Cancer Father        esophogeal cancer   Hypertension Father    Asthma Brother     Social History:  Social History   Socioeconomic History   Marital status: Single    Spouse name: Not on file   Number of children: 1   Years of education: 10   Highest education level: 10th grade  Occupational History   Not on file  Tobacco Use   Smoking status: Every Day    Packs/day: 0.50    Years: 8.00    Pack years: 4.00    Types: Cigarettes   Smokeless tobacco: Never  Vaping Use   Vaping Use: Every day   Substances: Nicotine, Flavoring  Substance and Sexual Activity   Alcohol use: Not Currently    Alcohol/week: 13.0 standard drinks    Types: 6 Cans of beer, 7 Standard drinks or equivalent per week    Comment: previously heavy drinker 2016. most recent 4 beer/ day drinker and none since 01-11-2020   Drug use: Yes    Types: Methamphetamines     Comment: states he uses 1 g every other day   Sexual activity: Not on file  Other Topics Concern   Not on file  Social History Narrative   Not on file   Social Determinants of  Health   Financial Resource Strain: Not on file  Food Insecurity: Not on file  Transportation Needs: Not on file  Physical Activity: Not on file  Stress: Not on file  Social Connections: Not on file  Intimate Partner Violence: Not on file    SDOH:  SDOH Screenings   Alcohol Screen: Low Risk    Last Alcohol Screening Score (AUDIT): 1  Depression (PHQ2-9): Medium Risk   PHQ-2 Score: 16  Financial Resource Strain: Not on file  Food Insecurity: Not on file  Housing: Not on file  Physical Activity: Not on file  Social Connections: Not on file  Stress: Not on file  Tobacco Use: High Risk   Smoking Tobacco Use: Every Day   Smokeless Tobacco Use: Never   Passive Exposure: Not on file  Transportation Needs: Not on file    Last Labs:  Admission on 04/14/2021, Discharged on 04/15/2021  Component Date Value Ref Range Status   SARS Coronavirus 2 by RT PCR 04/14/2021 NEGATIVE  NEGATIVE Final   Comment: (NOTE) SARS-CoV-2 target nucleic acids are NOT DETECTED.  The SARS-CoV-2 RNA is generally detectable in upper respiratory specimens during the acute phase of infection. The lowest concentration of SARS-CoV-2 viral copies this assay can detect is 138 copies/mL. A negative result does not preclude SARS-Cov-2 infection and should not be used as the sole basis for treatment or other patient management decisions. A negative result may occur with  improper specimen collection/handling, submission of specimen other than nasopharyngeal swab, presence of viral mutation(s) within the areas targeted by this assay, and inadequate number of viral copies(<138 copies/mL). A negative result must be combined with clinical observations, patient history, and epidemiological information. The expected result is  Negative.  Fact Sheet for Patients:  BloggerCourse.com  Fact Sheet for Healthcare Providers:  SeriousBroker.it  This test is no                          t yet approved or cleared by the [place] FDA and  has been authorized for detection and/or diagnosis of SARS-CoV-2 by FDA under an Emergency Use Authorization (EUA). This EUA will remain  in effect (meaning this test can be used) for the duration of the COVID-19 declaration under Section 564(b)(1) of the Act, 21 U.S.C.section 360bbb-3(b)(1), unless the authorization is terminated  or revoked sooner.       Influenza A by PCR 04/14/2021 NEGATIVE  NEGATIVE Final   Influenza B by PCR 04/14/2021 NEGATIVE  NEGATIVE Final   Comment: (NOTE) The Xpert Xpress SARS-CoV-2/FLU/RSV plus assay is intended as an aid in the diagnosis of influenza from Nasopharyngeal swab specimens and should not be used as a sole basis for treatment. Nasal washings and aspirates are unacceptable for Xpert Xpress SARS-CoV-2/FLU/RSV testing.  Fact Sheet for Patients: BloggerCourse.com  Fact Sheet for Healthcare Providers: SeriousBroker.it  This test is not yet approved or cleared by the [place] FDA and has been authorized for detection and/or diagnosis of SARS-CoV-2 by FDA under an Emergency Use Authorization (EUA). This EUA will remain in effect (meaning this test can be used) for the duration of the COVID-19 declaration under Section 564(b)(1) of the Act, 21 U.S.C. section 360bbb-3(b)(1), unless the authorization is terminated or revoked.  Performed at [facility], [address] [place], [place] [address]    Sodium 04/14/2021 134 (L)  135 - 145 mmol/L Final   Potassium 04/14/2021 3.6  3.5 - 5.1 mmol/L Final   Chloride  04/14/2021 94 (L)  98 - 111 mmol/L Final   CO2 04/14/2021 28  22 - 32 mmol/L Final   Glucose, Bld 04/14/2021 119 (H)  70 - 99  mg/dL Final   Glucose reference range applies only to samples taken after fasting for at least 8 hours.   BUN 04/14/2021 14  6 - 20 mg/dL Final   Creatinine, Ser 04/14/2021 0.77  0.61 - 1.24 mg/dL Final   Calcium 62/13/0865 9.7  8.9 - 10.3 mg/dL Final   Total Protein 78/46/9629 8.8 (H)  6.5 - 8.1 g/dL Final   Albumin 52/84/1324 4.4  3.5 - 5.0 g/dL Final   AST 40/01/2724 47 (H)  15 - 41 U/L Final   ALT 04/14/2021 46 (H)  0 - 44 U/L Final   Alkaline Phosphatase 04/14/2021 71  38 - 126 U/L Final   Total Bilirubin 04/14/2021 1.0  0.3 - 1.2 mg/dL Final   GFR, Estimated 04/14/2021 >60  >60 mL/min Final   Comment: (NOTE) Calculated using the CKD-EPI Creatinine Equation (2021)    Anion gap 04/14/2021 12  5 - 15 Final   Performed at [facility], [address], [place], [place] [address]   Alcohol, Ethyl (B) 04/14/2021 <10  <10 mg/dL Final   Comment: (NOTE) Lowest detectable limit for serum alcohol is 10 mg/dL.  For medical purposes only. Performed at [facility], [address] [place], [place] [address]    Salicylate Lvl 04/14/2021 <7.0 (L)  7.0 - 30.0 mg/dL Final   Performed at [facility], [address] [place], [place] [address]   Acetaminophen (Tylenol), Serum 04/14/2021 <10 (L)  10 - 30 ug/mL Final   Comment: (NOTE) Therapeutic concentrations vary significantly. A range of 10-30 ug/mL  may be an effective concentration for many patients. However, some  are best treated at concentrations outside of this range. Acetaminophen concentrations >150 ug/mL at 4 hours after ingestion  and >50 ug/mL at 12 hours after ingestion are often associated with  toxic reactions.  Performed at [facility], [address] [place], [place] [address]    WBC 04/14/2021 12.6 (H)  4.0 - 10.5 K/uL Final   RBC 04/14/2021 5.35  4.22 - 5.81 MIL/uL Final   Hemoglobin 04/14/2021 15.7  13.0 - 17.0 g/dL Final   HCT 43/32/9518 46.9  39.0 - 52.0 % Final   MCV 04/14/2021 87.7  80.0 -  100.0 fL Final   MCH 04/14/2021 29.3  26.0 - 34.0 pg Final   MCHC 04/14/2021 33.5  30.0 - 36.0 g/dL Final   RDW 84/16/6063 13.5  11.5 - 15.5 % Final   Platelets 04/14/2021 481 (H)  150 - 400 K/uL Final   nRBC 04/14/2021 0.0  0.0 - 0.2 % Final   Performed at [facility], [address], [place], [place] [address]   Opiates 04/14/2021 NONE DETECTED  NONE DETECTED Final   Cocaine 04/14/2021 NONE DETECTED  NONE DETECTED Final   Benzodiazepines 04/14/2021 NONE DETECTED  NONE DETECTED Final   Amphetamines 04/14/2021 POSITIVE (A)  NONE DETECTED Final   Tetrahydrocannabinol 04/14/2021 NONE DETECTED  NONE DETECTED Final   Barbiturates 04/14/2021 NONE DETECTED  NONE DETECTED Final   Comment: (NOTE) DRUG SCREEN FOR MEDICAL PURPOSES ONLY.  IF CONFIRMATION IS NEEDED FOR ANY PURPOSE, NOTIFY LAB WITHIN 5 DAYS.  LOWEST DETECTABLE LIMITS FOR URINE DRUG SCREEN Drug Class                     Cutoff (ng/mL)  Amphetamine and metabolites    1000 Barbiturate and metabolites    200 Benzodiazepine                 200 Tricyclics and metabolites     300 Opiates and metabolites        300 Cocaine and metabolites        300 THC                            50 Performed at [facility], [address] [place], [place] [address]   Admission on 04/05/2021, Discharged on 04/05/2021  Component Date Value Ref Range Status   Lipase 04/05/2021 24  11 - 51 U/L Final   Performed at [facility], [address] [place], [place] [address]   Sodium 04/05/2021 140  135 - 145 mmol/L Final   Potassium 04/05/2021 3.8  3.5 - 5.1 mmol/L Final   Chloride 04/05/2021 105  98 - 111 mmol/L Final   CO2 04/05/2021 28  22 - 32 mmol/L Final   Glucose, Bld 04/05/2021 88  70 - 99 mg/dL Final   Glucose reference range applies only to samples taken after fasting for at least 8 hours.   BUN 04/05/2021 10  6 - 20 mg/dL Final   Creatinine, Ser 04/05/2021 0.85  0.61 - 1.24 mg/dL Final   Calcium 09/81/1914 9.9  8.9 -  10.3 mg/dL Final   Total Protein 78/29/5621 7.9  6.5 - 8.1 g/dL Final   Albumin 30/86/5784 4.6  3.5 - 5.0 g/dL Final   AST 69/62/9528 13 (L)  15 - 41 U/L Final   ALT 04/05/2021 11  0 - 44 U/L Final   Alkaline Phosphatase 04/05/2021 65  38 - 126 U/L Final   Total Bilirubin 04/05/2021 0.8  0.3 - 1.2 mg/dL Final   GFR, Estimated 04/05/2021 >60  >60 mL/min Final   Comment: (NOTE) Calculated using the CKD-EPI Creatinine Equation (2021)    Anion gap 04/05/2021 7  5 - 15 Final   Performed at [facility], [address], [place], [place] [address]   WBC 04/05/2021 6.5  4.0 - 10.5 K/uL Final   RBC 04/05/2021 5.25  4.22 - 5.81 MIL/uL Final   Hemoglobin 04/05/2021 14.6  13.0 - 17.0 g/dL Final   HCT 40/01/2724 45.2  39.0 - 52.0 % Final   MCV 04/05/2021 86.1  80.0 - 100.0 fL Final   MCH 04/05/2021 27.8  26.0 - 34.0 pg Final   MCHC 04/05/2021 32.3  30.0 - 36.0 g/dL Final   RDW 36/64/4034 13.9  11.5 - 15.5 % Final   Platelets 04/05/2021 324  150 - 400 K/uL Final   nRBC 04/05/2021 0.0  0.0 - 0.2 % Final   Performed at [facility], [address], [place], [place] [address]   SARS Coronavirus 2 by RT PCR 04/05/2021 NEGATIVE  NEGATIVE Final   Comment: (NOTE) SARS-CoV-2 target nucleic acids are NOT DETECTED.  The SARS-CoV-2 RNA is generally detectable in upper respiratory specimens during the acute phase of infection. The lowest concentration of SARS-CoV-2 viral copies this assay can detect is 138 copies/mL. A negative result does not preclude SARS-Cov-2 infection and should not be used as the sole basis for treatment or other patient management decisions. A negative result may occur with  improper specimen collection/handling, submission of specimen other than nasopharyngeal swab, presence of viral mutation(s) within the areas targeted by this assay, and inadequate  number of viral copies(<138 copies/mL). A negative result must be combined with clinical  observations, patient history, and epidemiological information. The expected result is Negative.  Fact Sheet for Patients:  BloggerCourse.com  Fact Sheet for Healthcare Providers:  SeriousBroker.it  This test is no                          t yet approved or cleared by the [place] FDA and  has been authorized for detection and/or diagnosis of SARS-CoV-2 by FDA under an Emergency Use Authorization (EUA). This EUA will remain  in effect (meaning this test can be used) for the duration of the COVID-19 declaration under Section 564(b)(1) of the Act, 21 U.S.C.section 360bbb-3(b)(1), unless the authorization is terminated  or revoked sooner.       Influenza A by PCR 04/05/2021 NEGATIVE  NEGATIVE Final   Influenza B by PCR 04/05/2021 NEGATIVE  NEGATIVE Final   Comment: (NOTE) The Xpert Xpress SARS-CoV-2/FLU/RSV plus assay is intended as an aid in the diagnosis of influenza from Nasopharyngeal swab specimens and should not be used as a sole basis for treatment. Nasal washings and aspirates are unacceptable for Xpert Xpress SARS-CoV-2/FLU/RSV testing.  Fact Sheet for Patients: BloggerCourse.com  Fact Sheet for Healthcare Providers: SeriousBroker.it  This test is not yet approved or cleared by the [place] FDA and has been authorized for detection and/or diagnosis of SARS-CoV-2 by FDA under an Emergency Use Authorization (EUA). This EUA will remain in effect (meaning this test can be used) for the duration of the COVID-19 declaration under Section 564(b)(1) of the Act, 21 U.S.C. section 360bbb-3(b)(1), unless the authorization is terminated or revoked.  Performed at [facility], [address], [place], [place] [address]   Admission on 02/22/2021, Discharged on 02/22/2021  Component Date Value Ref Range Status   Sodium 02/22/2021 133 (L)  135 - 145  mmol/L Final   Potassium 02/22/2021 3.8  3.5 - 5.1 mmol/L Final   Chloride 02/22/2021 93 (L)  98 - 111 mmol/L Final   CO2 02/22/2021 30  22 - 32 mmol/L Final   Glucose, Bld 02/22/2021 116 (H)  70 - 99 mg/dL Final   Glucose reference range applies only to samples taken after fasting for at least 8 hours.   BUN 02/22/2021 26 (H)  6 - 20 mg/dL Final   Creatinine, Ser 02/22/2021 0.95  0.61 - 1.24 mg/dL Final   Calcium 60/45/4098 9.7  8.9 - 10.3 mg/dL Final   Total Protein 11/91/4782 8.6 (H)  6.5 - 8.1 g/dL Final   Albumin 95/62/1308 4.7  3.5 - 5.0 g/dL Final   AST 65/78/4696 51 (H)  15 - 41 U/L Final   ALT 02/22/2021 34  0 - 44 U/L Final   Alkaline Phosphatase 02/22/2021 70  38 - 126 U/L Final   Total Bilirubin 02/22/2021 2.6 (H)  0.3 - 1.2 mg/dL Final   GFR, Estimated 02/22/2021 >60  >60 mL/min Final   Comment: (NOTE) Calculated using the CKD-EPI Creatinine Equation (2021)    Anion gap 02/22/2021 10  5 - 15 Final   Performed at [facility], [address] [place], [place] [address]   Alcohol, Ethyl (B) 02/22/2021 <10  <10 mg/dL Final   Comment: (NOTE) Lowest detectable limit for serum alcohol is 10 mg/dL.  For medical purposes only. Performed at [facility], [address] [place], [place] [address]    WBC 02/22/2021 11.4 (H)  4.0 -  10.5 K/uL Final   RBC 02/22/2021 5.00  4.22 - 5.81 MIL/uL Final   Hemoglobin 02/22/2021 14.6  13.0 - 17.0 g/dL Final   HCT 96/07/5407 41.4  39.0 - 52.0 % Final   MCV 02/22/2021 82.8  80.0 - 100.0 fL Final   MCH 02/22/2021 29.2  26.0 - 34.0 pg Final   MCHC 02/22/2021 35.3  30.0 - 36.0 g/dL Final   RDW 81/19/1478 13.1  11.5 - 15.5 % Final   Platelets 02/22/2021 399  150 - 400 K/uL Final   nRBC 02/22/2021 0.0  0.0 - 0.2 % Final   Performed at [facility], [address] [place], [place] [address]   Tricyclic, Ur Screen 02/22/2021 NONE DETECTED  NONE DETECTED Final   Amphetamines, Ur Screen 02/22/2021 POSITIVE (A)   NONE DETECTED Final   MDMA (Ecstasy)Ur Screen 02/22/2021 NONE DETECTED  NONE DETECTED Final   Cocaine Metabolite,Ur [place] 02/22/2021 NONE DETECTED  NONE DETECTED Final   Opiate, Ur Screen 02/22/2021 NONE DETECTED  NONE DETECTED Final   Phencyclidine (PCP) Ur S 02/22/2021 NONE DETECTED  NONE DETECTED Final   Cannabinoid 50 Ng, Ur [place] 02/22/2021 POSITIVE (A)  NONE DETECTED Final   Barbiturates, Ur Screen 02/22/2021 NONE DETECTED  NONE DETECTED Final   Benzodiazepine, Ur Scrn 02/22/2021 NONE DETECTED  NONE DETECTED Final   Methadone Scn, Ur 02/22/2021 NONE DETECTED  NONE DETECTED Final   Comment: (NOTE) Tricyclics + metabolites, urine    Cutoff 1000 ng/mL Amphetamines + metabolites, urine  Cutoff 1000 ng/mL MDMA (Ecstasy), urine              Cutoff 500 ng/mL Cocaine Metabolite, urine          Cutoff 300 ng/mL Opiate + metabolites, urine        Cutoff 300 ng/mL Phencyclidine (PCP), urine         Cutoff 25 ng/mL Cannabinoid, urine                 Cutoff 50 ng/mL Barbiturates + metabolites, urine  Cutoff 200 ng/mL Benzodiazepine, urine              Cutoff 200 ng/mL Methadone, urine                   Cutoff 300 ng/mL  The urine drug screen provides only a preliminary, unconfirmed analytical test result and should not be used for non-medical purposes. Clinical consideration and professional judgment should be applied to any positive drug screen result due to possible interfering substances. A more specific alternate chemical method must be used in order to obtain a confirmed analytical result. Gas chromatography / mass spectrometry (GC/MS) is the preferred confirm                          atory method. Performed at [facility], [address] [place], [place] [address]   Admission on 02/18/2021, Discharged on 02/18/2021  Component Date Value Ref Range Status   WBC 02/18/2021 15.0 (H)  4.0 - 10.5 K/uL Final   RBC 02/18/2021 5.61  4.22 - 5.81 MIL/uL Final   Hemoglobin 02/18/2021  15.9  13.0 - 17.0 g/dL Final   HCT 57/84/6962 46.8  39.0 - 52.0 % Final   MCV 02/18/2021 83.4  80.0 - 100.0 fL Final   MCH 02/18/2021 28.3  26.0 - 34.0 pg Final   MCHC 02/18/2021 34.0  30.0 - 36.0 g/dL Final   RDW 95/28/4132 13.3  11.5 - 15.5 % Final   Platelets 02/18/2021 430 (H)  150 - 400 K/uL Final   nRBC 02/18/2021 0.0  0.0 - 0.2 % Final   Neutrophils Relative % 02/18/2021 74  % Final   Neutro Abs 02/18/2021 11.1 (H)  1.7 - 7.7 K/uL Final   Lymphocytes Relative 02/18/2021 17  % Final   Lymphs Abs 02/18/2021 2.5  0.7 - 4.0 K/uL Final   Monocytes Relative 02/18/2021 9  % Final   Monocytes Absolute 02/18/2021 1.3 (H)  0.1 - 1.0 K/uL Final   Eosinophils Relative 02/18/2021 0  % Final   Eosinophils Absolute 02/18/2021 0.0  0.0 - 0.5 K/uL Final   Basophils Relative 02/18/2021 0  % Final   Basophils Absolute 02/18/2021 0.1  0.0 - 0.1 K/uL Final   Immature Granulocytes 02/18/2021 0  % Final   Abs Immature Granulocytes 02/18/2021 0.04  0.00 - 0.07 K/uL Final   Performed at [facility], [address] [place], [place] [address]   Sodium 02/18/2021 136  135 - 145 mmol/L Final   Potassium 02/18/2021 3.8  3.5 - 5.1 mmol/L Final   Chloride 02/18/2021 99  98 - 111 mmol/L Final   CO2 02/18/2021 24  22 - 32 mmol/L Final   Glucose, Bld 02/18/2021 139 (H)  70 - 99 mg/dL Final   Glucose reference range applies only to samples taken after fasting for at least 8 hours.   BUN 02/18/2021 18  6 - 20 mg/dL Final   Creatinine, Ser 02/18/2021 1.26 (H)  0.61 - 1.24 mg/dL Final   Calcium 60/45/4098 10.3  8.9 - 10.3 mg/dL Final   Total Protein 11/91/4782 9.7 (H)  6.5 - 8.1 g/dL Final   Albumin 95/62/1308 5.2 (H)  3.5 - 5.0 g/dL Final   AST 65/78/4696 52 (H)  15 - 41 U/L Final   ALT 02/18/2021 24  0 - 44 U/L Final   Alkaline Phosphatase 02/18/2021 83  38 - 126 U/L Final   Total Bilirubin 02/18/2021 1.9 (H)  0.3 - 1.2 mg/dL Final   GFR, Estimated 02/18/2021 >60  >60 mL/min Final   Comment:  (NOTE) Calculated using the CKD-EPI Creatinine Equation (2021)    Anion gap 02/18/2021 13  5 - 15 Final   Performed at [facility], [address] [place], [place] [address]   Lipase 02/18/2021 24  11 - 51 U/L Final   Performed at [facility], [address] [place], [place] [address]   SARS Coronavirus 2 by RT PCR 02/18/2021 NEGATIVE  NEGATIVE Final   Comment: (NOTE) SARS-CoV-2 target nucleic acids are NOT DETECTED.  The SARS-CoV-2 RNA is generally detectable in upper respiratory specimens during the acute phase of infection. The lowest concentration of SARS-CoV-2 viral copies this assay can detect is 138 copies/mL. A negative result does not preclude SARS-Cov-2 infection and should not be used as the sole basis for treatment or other patient management decisions. A negative result may occur with  improper specimen collection/handling, submission of specimen other than nasopharyngeal swab, presence of viral mutation(s) within the areas targeted by this assay, and inadequate number of viral copies(<138 copies/mL). A negative result must be combined with clinical observations, patient history, and epidemiological information. The expected result is Negative.  Fact Sheet for Patients:  BloggerCourse.com  Fact Sheet for Healthcare Providers:  SeriousBroker.it  This test is no  t yet approved or cleared by the [place] and  has been authorized for detection and/or diagnosis of SARS-CoV-2 by FDA under an Emergency Use Authorization (EUA). This EUA will remain  in effect (meaning this test can be used) for the duration of the COVID-19 declaration under Section 564(b)(1) of the Act, 21 U.S.C.section 360bbb-3(b)(1), unless the authorization is terminated  or revoked sooner.       Influenza A by PCR 02/18/2021 NEGATIVE  NEGATIVE Final   Influenza B by PCR 02/18/2021 NEGATIVE   NEGATIVE Final   Comment: (NOTE) The Xpert Xpress SARS-CoV-2/FLU/RSV plus assay is intended as an aid in the diagnosis of influenza from Nasopharyngeal swab specimens and should not be used as a sole basis for treatment. Nasal washings and aspirates are unacceptable for Xpert Xpress SARS-CoV-2/FLU/RSV testing.  Fact Sheet for Patients: BloggerCourse.com  Fact Sheet for Healthcare Providers: SeriousBroker.it  This test is not yet approved or cleared by the [place] FDA and has been authorized for detection and/or diagnosis of SARS-CoV-2 by FDA under an Emergency Use Authorization (EUA). This EUA will remain in effect (meaning this test can be used) for the duration of the COVID-19 declaration under Section 564(b)(1) of the Act, 21 U.S.C. section 360bbb-3(b)(1), unless the authorization is terminated or revoked.  Performed at [facility], [address] [place], [place] [address]   Admission on 02/13/2021, Discharged on 02/14/2021  Component Date Value Ref Range Status   SARS Coronavirus 2 by RT PCR 02/13/2021 NEGATIVE  NEGATIVE Final   Comment: (NOTE) SARS-CoV-2 target nucleic acids are NOT DETECTED.  The SARS-CoV-2 RNA is generally detectable in upper respiratory specimens during the acute phase of infection. The lowest concentration of SARS-CoV-2 viral copies this assay can detect is 138 copies/mL. A negative result does not preclude SARS-Cov-2 infection and should not be used as the sole basis for treatment or other patient management decisions. A negative result may occur with  improper specimen collection/handling, submission of specimen other than nasopharyngeal swab, presence of viral mutation(s) within the areas targeted by this assay, and inadequate number of viral copies(<138 copies/mL). A negative result must be combined with clinical observations, patient history, and epidemiological information.  The expected result is Negative.  Fact Sheet for Patients:  BloggerCourse.com  Fact Sheet for Healthcare Providers:  SeriousBroker.it  This test is no                          t yet approved or cleared by the [place] FDA and  has been authorized for detection and/or diagnosis of SARS-CoV-2 by FDA under an Emergency Use Authorization (EUA). This EUA will remain  in effect (meaning this test can be used) for the duration of the COVID-19 declaration under Section 564(b)(1) of the Act, 21 U.S.C.section 360bbb-3(b)(1), unless the authorization is terminated  or revoked sooner.       Influenza A by PCR 02/13/2021 NEGATIVE  NEGATIVE Final   Influenza B by PCR 02/13/2021 NEGATIVE  NEGATIVE Final   Comment: (NOTE) The Xpert Xpress SARS-CoV-2/FLU/RSV plus assay is intended as an aid in the diagnosis of influenza from Nasopharyngeal swab specimens and should not be used as a sole basis for treatment. Nasal washings and aspirates are unacceptable for Xpert Xpress SARS-CoV-2/FLU/RSV testing.  Fact Sheet for Patients: BloggerCourse.com  Fact Sheet for Healthcare Providers: SeriousBroker.it  This test is not yet approved or cleared by the [place] FDA and has been authorized for detection  and/or diagnosis of SARS-CoV-2 by FDA under an Emergency Use Authorization (EUA). This EUA will remain in effect (meaning this test can be used) for the duration of the COVID-19 declaration under Section 564(b)(1) of the Act, 21 U.S.C. section 360bbb-3(b)(1), unless the authorization is terminated or revoked.  Performed at [facility], [address] [place], [place] [address]    Alcohol, Ethyl (B) 02/13/2021 <10  <10 mg/dL Final   Comment: (NOTE) Lowest detectable limit for serum alcohol is 10 mg/dL.  For medical purposes only. Performed at [facility], [address],  [place], [place] [address]    POC Amphetamine UR 02/13/2021 Positive (A)  NONE DETECTED (Cut Off Level 1000 ng/mL) Final   POC Secobarbital (BAR) 02/13/2021 None Detected  NONE DETECTED (Cut Off Level 300 ng/mL) Final   POC Buprenorphine (BUP) 02/13/2021 None Detected  NONE DETECTED (Cut Off Level 10 ng/mL) Final   POC Oxazepam (BZO) 02/13/2021 None Detected  NONE DETECTED (Cut Off Level 300 ng/mL) Final   POC Cocaine UR 02/13/2021 None Detected  NONE DETECTED (Cut Off Level 300 ng/mL) Final   POC Methamphetamine UR 02/13/2021 Positive (A)  NONE DETECTED (Cut Off Level 1000 ng/mL) Final   POC Morphine 02/13/2021 None Detected  NONE DETECTED (Cut Off Level 300 ng/mL) Final   POC Oxycodone UR 02/13/2021 None Detected  NONE DETECTED (Cut Off Level 100 ng/mL) Final   POC Methadone UR 02/13/2021 None Detected  NONE DETECTED (Cut Off Level 300 ng/mL) Final   POC Marijuana UR 02/13/2021 None Detected  NONE DETECTED (Cut Off Level 50 ng/mL) Final   SARSCOV2ONAVIRUS 2 AG 02/13/2021 NEGATIVE  NEGATIVE Final   Comment: (NOTE) SARS-CoV-2 antigen NOT DETECTED.   Negative results are presumptive.  Negative results do not preclude SARS-CoV-2 infection and should not be used as the sole basis for treatment or other patient management decisions, including infection  control decisions, particularly in the presence of clinical signs and  symptoms consistent with COVID-19, or in those who have been in contact with the virus.  Negative results must be combined with clinical observations, patient history, and epidemiological information. The expected result is Negative.  Fact Sheet for Patients: https://www.jennings-kim.com/  Fact Sheet for Healthcare Providers: https://alexander-rogers.biz/  This test is not yet approved or cleared by the [place] FDA and  has been authorized for detection and/or diagnosis of SARS-CoV-2 by FDA under an Emergency Use Authorization (EUA).  This EUA  will remain in effect (meaning this test can be used) for the duration of  the COV                          ID-19 declaration under Section 564(b)(1) of the Act, 21 U.S.C. section 360bbb-3(b)(1), unless the authorization is terminated or revoked sooner.    Admission on 02/11/2021, Discharged on 02/12/2021  Component Date Value Ref Range Status   Sodium 02/11/2021 140  135 - 145 mmol/L Final   Potassium 02/11/2021 4.1  3.5 - 5.1 mmol/L Final   Chloride 02/11/2021 103  98 - 111 mmol/L Final   CO2 02/11/2021 24  22 - 32 mmol/L Final   Glucose, Bld 02/11/2021 89  70 - 99 mg/dL Final   Glucose reference range applies only to samples taken after fasting for at least 8 hours.   BUN 02/11/2021 12  6 - 20 mg/dL Final   Creatinine, Ser 02/11/2021 0.89  0.61 - 1.24 mg/dL Final   Calcium 19/14/7829  10.2  8.9 - 10.3 mg/dL Final   Total Protein 40/81/4481 8.4 (H)  6.5 - 8.1 g/dL Final   Albumin 85/63/1497 4.6  3.5 - 5.0 g/dL Final   AST 02/63/7858 40  15 - 41 U/L Final   ALT 02/11/2021 19  0 - 44 U/L Final   Alkaline Phosphatase 02/11/2021 83  38 - 126 U/L Final   Total Bilirubin 02/11/2021 1.7 (H)  0.3 - 1.2 mg/dL Final   GFR, Estimated 02/11/2021 >60  >60 mL/min Final   Comment: (NOTE) Calculated using the CKD-EPI Creatinine Equation (2021)    Anion gap 02/11/2021 13  5 - 15 Final   Performed at [facility], [address], [place], [place] [address]   Alcohol, Ethyl (B) 02/11/2021 <10  <10 mg/dL Final   Comment: (NOTE) Lowest detectable limit for serum alcohol is 10 mg/dL.  For medical purposes only. Performed at [facility], [address], [place], [place] [address]    Salicylate Lvl 02/11/2021 <7.0 (L)  7.0 - 30.0 mg/dL Final   Performed at [facility], [address], [place], [place] [address]   Acetaminophen (Tylenol), Serum 02/11/2021 <10 (L)  10 - 30 ug/mL Final   Comment: (NOTE) Therapeutic concentrations vary significantly. A range of 10-30 ug/mL  may  be an effective concentration for many patients. However, some  are best treated at concentrations outside of this range. Acetaminophen concentrations >150 ug/mL at 4 hours after ingestion  and >50 ug/mL at 12 hours after ingestion are often associated with  toxic reactions.  Performed at [facility], [address] [place], [place] [address]    WBC 02/11/2021 11.6 (H)  4.0 - 10.5 K/uL Final   RBC 02/11/2021 5.47  4.22 - 5.81 MIL/uL Final   Hemoglobin 02/11/2021 15.5  13.0 - 17.0 g/dL Final   HCT 70/96/2836 46.5  39.0 - 52.0 % Final   MCV 02/11/2021 85.0  80.0 - 100.0 fL Final   MCH 02/11/2021 28.3  26.0 - 34.0 pg Final   MCHC 02/11/2021 33.3  30.0 - 36.0 g/dL Final   RDW 62/94/7654 13.8  11.5 - 15.5 % Final   Platelets 02/11/2021 337  150 - 400 K/uL Final   nRBC 02/11/2021 0.0  0.0 - 0.2 % Final   Performed at [facility], [address], [place], [place] [address]   Opiates 02/11/2021 NONE DETECTED  NONE DETECTED Final   Cocaine 02/11/2021 NONE DETECTED  NONE DETECTED Final   Benzodiazepines 02/11/2021 NONE DETECTED  NONE DETECTED Final   Amphetamines 02/11/2021 POSITIVE (A)  NONE DETECTED Final   Tetrahydrocannabinol 02/11/2021 NONE DETECTED  NONE DETECTED Final   Barbiturates 02/11/2021 NONE DETECTED  NONE DETECTED Final   Comment: (NOTE) DRUG SCREEN FOR MEDICAL PURPOSES ONLY.  IF CONFIRMATION IS NEEDED FOR ANY PURPOSE, NOTIFY LAB WITHIN 5 DAYS.  LOWEST DETECTABLE LIMITS FOR URINE DRUG SCREEN Drug Class                     Cutoff (ng/mL) Amphetamine and metabolites    1000 Barbiturate and metabolites    200 Benzodiazepine                 200 Tricyclics and metabolites     300 Opiates and metabolites        300 Cocaine and metabolites        300 THC  50 Performed at [facility], [address] [place], [place] [address]    SARS Coronavirus 2 by RT PCR 02/11/2021 NEGATIVE  NEGATIVE Final   Comment: (NOTE) SARS-CoV-2  target nucleic acids are NOT DETECTED.  The SARS-CoV-2 RNA is generally detectable in upper respiratory specimens during the acute phase of infection. The lowest concentration of SARS-CoV-2 viral copies this assay can detect is 138 copies/mL. A negative result does not preclude SARS-Cov-2 infection and should not be used as the sole basis for treatment or other patient management decisions. A negative result may occur with  improper specimen collection/handling, submission of specimen other than nasopharyngeal swab, presence of viral mutation(s) within the areas targeted by this assay, and inadequate number of viral copies(<138 copies/mL). A negative result must be combined with clinical observations, patient history, and epidemiological information. The expected result is Negative.  Fact Sheet for Patients:  BloggerCourse.com  Fact Sheet for Healthcare Providers:  SeriousBroker.it  This test is no                          t yet approved or cleared by the [place] FDA and  has been authorized for detection and/or diagnosis of SARS-CoV-2 by FDA under an Emergency Use Authorization (EUA). This EUA will remain  in effect (meaning this test can be used) for the duration of the COVID-19 declaration under Section 564(b)(1) of the Act, 21 U.S.C.section 360bbb-3(b)(1), unless the authorization is terminated  or revoked sooner.       Influenza A by PCR 02/11/2021 NEGATIVE  NEGATIVE Final   Influenza B by PCR 02/11/2021 NEGATIVE  NEGATIVE Final   Comment: (NOTE) The Xpert Xpress SARS-CoV-2/FLU/RSV plus assay is intended as an aid in the diagnosis of influenza from Nasopharyngeal swab specimens and should not be used as a sole basis for treatment. Nasal washings and aspirates are unacceptable for Xpert Xpress SARS-CoV-2/FLU/RSV testing.  Fact Sheet for Patients: BloggerCourse.com  Fact Sheet for Healthcare  Providers: SeriousBroker.it  This test is not yet approved or cleared by the [place] FDA and has been authorized for detection and/or diagnosis of SARS-CoV-2 by FDA under an Emergency Use Authorization (EUA). This EUA will remain in effect (meaning this test can be used) for the duration of the COVID-19 declaration under Section 564(b)(1) of the Act, 21 U.S.C. section 360bbb-3(b)(1), unless the authorization is terminated or revoked.  Performed at [facility], [address], [place], [place] [address]   Admission on 02/05/2021, Discharged on 02/05/2021  Component Date Value Ref Range Status   SARS Coronavirus 2 by RT PCR 02/05/2021 NEGATIVE  NEGATIVE Final   Comment: (NOTE) SARS-CoV-2 target nucleic acids are NOT DETECTED.  The SARS-CoV-2 RNA is generally detectable in upper respiratory specimens during the acute phase of infection. The lowest concentration of SARS-CoV-2 viral copies this assay can detect is 138 copies/mL. A negative result does not preclude SARS-Cov-2 infection and should not be used as the sole basis for treatment or other patient management decisions. A negative result may occur with  improper specimen collection/handling, submission of specimen other than nasopharyngeal swab, presence of viral mutation(s) within the areas targeted by this assay, and inadequate number of viral copies(<138 copies/mL). A negative result must be combined with clinical observations, patient history, and epidemiological information. The expected result is Negative.  Fact Sheet for Patients:  BloggerCourse.com  Fact Sheet for Healthcare Providers:  SeriousBroker.it  This test is no  t yet approved or cleared by the [place] and  has been authorized for detection and/or diagnosis of SARS-CoV-2 by FDA under an Emergency Use Authorization (EUA). This EUA will  remain  in effect (meaning this test can be used) for the duration of the COVID-19 declaration under Section 564(b)(1) of the Act, 21 U.S.C.section 360bbb-3(b)(1), unless the authorization is terminated  or revoked sooner.       Influenza A by PCR 02/05/2021 NEGATIVE  NEGATIVE Final   Influenza B by PCR 02/05/2021 NEGATIVE  NEGATIVE Final   Comment: (NOTE) The Xpert Xpress SARS-CoV-2/FLU/RSV plus assay is intended as an aid in the diagnosis of influenza from Nasopharyngeal swab specimens and should not be used as a sole basis for treatment. Nasal washings and aspirates are unacceptable for Xpert Xpress SARS-CoV-2/FLU/RSV testing.  Fact Sheet for Patients: BloggerCourse.com  Fact Sheet for Healthcare Providers: SeriousBroker.it  This test is not yet approved or cleared by the [place] FDA and has been authorized for detection and/or diagnosis of SARS-CoV-2 by FDA under an Emergency Use Authorization (EUA). This EUA will remain in effect (meaning this test can be used) for the duration of the COVID-19 declaration under Section 564(b)(1) of the Act, 21 U.S.C. section 360bbb-3(b)(1), unless the authorization is terminated or revoked.  Performed at [facility], [address], [place], [place] [address]    SARS Coronavirus 2 Ag 02/05/2021 Negative  Negative Preliminary   WBC 02/05/2021 9.2  4.0 - 10.5 K/uL Final   RBC 02/05/2021 5.05  4.22 - 5.81 MIL/uL Final   Hemoglobin 02/05/2021 14.4  13.0 - 17.0 g/dL Final   HCT 60/45/4098 43.1  39.0 - 52.0 % Final   MCV 02/05/2021 85.3  80.0 - 100.0 fL Final   MCH 02/05/2021 28.5  26.0 - 34.0 pg Final   MCHC 02/05/2021 33.4  30.0 - 36.0 g/dL Final   RDW 11/91/4782 14.0  11.5 - 15.5 % Final   Platelets 02/05/2021 346  150 - 400 K/uL Final   nRBC 02/05/2021 0.0  0.0 - 0.2 % Final   Neutrophils Relative % 02/05/2021 59  % Final   Neutro Abs 02/05/2021 5.3  1.7 - 7.7 K/uL Final    Lymphocytes Relative 02/05/2021 29  % Final   Lymphs Abs 02/05/2021 2.7  0.7 - 4.0 K/uL Final   Monocytes Relative 02/05/2021 7  % Final   Monocytes Absolute 02/05/2021 0.7  0.1 - 1.0 K/uL Final   Eosinophils Relative 02/05/2021 5  % Final   Eosinophils Absolute 02/05/2021 0.5  0.0 - 0.5 K/uL Final   Basophils Relative 02/05/2021 0  % Final   Basophils Absolute 02/05/2021 0.0  0.0 - 0.1 K/uL Final   Immature Granulocytes 02/05/2021 0  % Final   Abs Immature Granulocytes 02/05/2021 0.03  0.00 - 0.07 K/uL Final   Performed at [facility], [address] [place], [place] [address]   Sodium 02/05/2021 138  135 - 145 mmol/L Final   Potassium 02/05/2021 4.0  3.5 - 5.1 mmol/L Final   Chloride 02/05/2021 102  98 - 111 mmol/L Final   CO2 02/05/2021 25  22 - 32 mmol/L Final   Glucose, Bld 02/05/2021 88  70 - 99 mg/dL Final   Glucose reference range applies only to samples taken after fasting for at least 8 hours.   BUN 02/05/2021 15  6 - 20 mg/dL Final   Creatinine, Ser 02/05/2021 0.76  0.61 - 1.24 mg/dL Final   Calcium  02/05/2021 9.8  8.9 - 10.3 mg/dL Final   Total Protein 16/01/9603 7.5  6.5 - 8.1 g/dL Final   Albumin 54/12/8117 4.3  3.5 - 5.0 g/dL Final   AST 14/78/2956 21  15 - 41 U/L Final   ALT 02/05/2021 16  0 - 44 U/L Final   Alkaline Phosphatase 02/05/2021 76  38 - 126 U/L Final   Total Bilirubin 02/05/2021 1.0  0.3 - 1.2 mg/dL Final   GFR, Estimated 02/05/2021 >60  >60 mL/min Final   Comment: (NOTE) Calculated using the CKD-EPI Creatinine Equation (2021)    Anion gap 02/05/2021 11  5 - 15 Final   Performed at [facility], [address], [place], [place] [address]   Alcohol, Ethyl (B) 02/05/2021 <10  <10 mg/dL Final   Comment: (NOTE) Lowest detectable limit for serum alcohol is 10 mg/dL.  For medical purposes only. Performed at [facility], [address] [place], [place] [address]    POC Amphetamine UR 02/05/2021 None Detected  NONE DETECTED (Cut Off  Level 1000 ng/mL) Preliminary   POC Secobarbital (BAR) 02/05/2021 None Detected  NONE DETECTED (Cut Off Level 300 ng/mL) Preliminary   POC Buprenorphine (BUP) 02/05/2021 None Detected  NONE DETECTED (Cut Off Level 10 ng/mL) Preliminary   POC Oxazepam (BZO) 02/05/2021 None Detected  NONE DETECTED (Cut Off Level 300 ng/mL) Preliminary   POC Cocaine UR 02/05/2021 None Detected  NONE DETECTED (Cut Off Level 300 ng/mL) Preliminary   POC Methamphetamine UR 02/05/2021 None Detected  NONE DETECTED (Cut Off Level 1000 ng/mL) Preliminary   POC Morphine 02/05/2021 None Detected  NONE DETECTED (Cut Off Level 300 ng/mL) Preliminary   POC Oxycodone UR 02/05/2021 None Detected  NONE DETECTED (Cut Off Level 100 ng/mL) Preliminary   POC Methadone UR 02/05/2021 None Detected  NONE DETECTED (Cut Off Level 300 ng/mL) Preliminary   POC Marijuana UR 02/05/2021 None Detected  NONE DETECTED (Cut Off Level 50 ng/mL) Preliminary   TSH 02/05/2021 5.750 (H)  0.350 - 4.500 uIU/mL Final   Comment: Performed by a 3rd Generation assay with a functional sensitivity of <=0.01 uIU/mL. Performed at [facility], [address], [place], [place] [address]    SARSCOV2ONAVIRUS 2 AG 02/05/2021 NEGATIVE  NEGATIVE Final   Comment: (NOTE) SARS-CoV-2 antigen NOT DETECTED.   Negative results are presumptive.  Negative results do not preclude SARS-CoV-2 infection and should not be used as the sole basis for treatment or other patient management decisions, including infection  control decisions, particularly in the presence of clinical signs and  symptoms consistent with COVID-19, or in those who have been in contact with the virus.  Negative results must be combined with clinical observations, patient history, and epidemiological information. The expected result is Negative.  Fact Sheet for Patients: https://www.jennings-kim.com/  Fact Sheet for Healthcare  Providers: https://alexander-rogers.biz/  This test is not yet approved or cleared by the [place] FDA and  has been authorized for detection and/or diagnosis of SARS-CoV-2 by FDA under an Emergency Use Authorization (EUA).  This EUA will remain in effect (meaning this test can be used) for the duration of  the COV                          ID-19 declaration under Section 564(b)(1) of the Act, 21 U.S.C. section 360bbb-3(b)(1), unless the authorization is terminated or revoked sooner.     Free T4 02/05/2021 0.80  0.61 - 1.12  ng/dL Final   Comment: (NOTE) Biotin ingestion may interfere with free T4 tests. If the results are inconsistent with the TSH level, previous test results, or the clinical presentation, then consider biotin interference. If needed, order repeat testing after stopping biotin. Performed at [facility], [address] [place], [place] [address]    T3, Free 02/05/2021 3.8  2.0 - 4.4 pg/mL Final   Comment: (NOTE) Performed At: [facility] [address] [place], [place] [id number] Jolene Schimke MD [id number]   Admission on 02/04/2021, Discharged on 02/04/2021  Component Date Value Ref Range Status   SARS Coronavirus 2 by RT PCR 02/04/2021 NEGATIVE  NEGATIVE Final   Comment: (NOTE) SARS-CoV-2 target nucleic acids are NOT DETECTED.  The SARS-CoV-2 RNA is generally detectable in upper respiratory specimens during the acute phase of infection. The lowest concentration of SARS-CoV-2 viral copies this assay can detect is 138 copies/mL. A negative result does not preclude SARS-Cov-2 infection and should not be used as the sole basis for treatment or other patient management decisions. A negative result may occur with  improper specimen collection/handling, submission of specimen other than nasopharyngeal swab, presence of viral mutation(s) within the areas targeted by this assay, and inadequate number of viral copies(<138  copies/mL). A negative result must be combined with clinical observations, patient history, and epidemiological information. The expected result is Negative.  Fact Sheet for Patients:  BloggerCourse.com  Fact Sheet for Healthcare Providers:  SeriousBroker.it  This test is no                          t yet approved or cleared by the [place] FDA and  has been authorized for detection and/or diagnosis of SARS-CoV-2 by FDA under an Emergency Use Authorization (EUA). This EUA will remain  in effect (meaning this test can be used) for the duration of the COVID-19 declaration under Section 564(b)(1) of the Act, 21 U.S.C.section 360bbb-3(b)(1), unless the authorization is terminated  or revoked sooner.       Influenza A by PCR 02/04/2021 NEGATIVE  NEGATIVE Final   Influenza B by PCR 02/04/2021 NEGATIVE  NEGATIVE Final   Comment: (NOTE) The Xpert Xpress SARS-CoV-2/FLU/RSV plus assay is intended as an aid in the diagnosis of influenza from Nasopharyngeal swab specimens and should not be used as a sole basis for treatment. Nasal washings and aspirates are unacceptable for Xpert Xpress SARS-CoV-2/FLU/RSV testing.  Fact Sheet for Patients: BloggerCourse.com  Fact Sheet for Healthcare Providers: SeriousBroker.it  This test is not yet approved or cleared by the [place] FDA and has been authorized for detection and/or diagnosis of SARS-CoV-2 by FDA under an Emergency Use Authorization (EUA). This EUA will remain in effect (meaning this test can be used) for the duration of the COVID-19 declaration under Section 564(b)(1) of the Act, 21 U.S.C. section 360bbb-3(b)(1), unless the authorization is terminated or revoked.  Performed at [facility], [address], [place], [place] [address]   Admission on 01/16/2021, Discharged on 01/24/2021  Component Date Value Ref Range  Status   Cholesterol 01/17/2021 94  0 - 200 mg/dL Final   Triglycerides 78/46/9629 100  <150 mg/dL Final   HDL 52/84/1324 28 (L)  >40 mg/dL Final   Total CHOL/HDL Ratio 01/17/2021 3.4  RATIO Final   VLDL 01/17/2021 20  0 - 40 mg/dL Final   LDL Cholesterol 01/17/2021 46  0 - 99 mg/dL Final   Comment:  Total Cholesterol/HDL:CHD Risk Coronary Heart Disease Risk Table                     Men   Women  1/2 Average Risk   3.4   3.3  Average Risk       5.0   4.4  2 X Average Risk   9.6   7.1  3 X Average Risk  23.4   11.0        Use the calculated Patient Ratio above and the CHD Risk Table to determine the patient's CHD Risk.        ATP III CLASSIFICATION (LDL):  <100     mg/dL   Optimal  433-295  mg/dL   Near or Above                    Optimal  130-159  mg/dL   Borderline  188-416  mg/dL   High  >606     mg/dL   Very High Performed at [facility], [address] [place], [place] [address]    Hgb A1c MFr Bld 01/17/2021 6.0 (H)  4.8 - 5.6 % Final   Comment: (NOTE)         Prediabetes: 5.7 - 6.4         Diabetes: >6.4         Glycemic control for adults with diabetes: <7.0    Mean Plasma Glucose 01/17/2021 126  mg/dL Final   Comment: (NOTE) Performed At: [facility] [address] [place], [place] [id number] Jolene Schimke MD [id number]   Admission on 01/16/2021, Discharged on 01/16/2021  Component Date Value Ref Range Status   Sodium 01/16/2021 134 (L)  135 - 145 mmol/L Final   Potassium 01/16/2021 3.8  3.5 - 5.1 mmol/L Final   Chloride 01/16/2021 96 (L)  98 - 111 mmol/L Final   CO2 01/16/2021 27  22 - 32 mmol/L Final   Glucose, Bld 01/16/2021 79  70 - 99 mg/dL Final   Glucose reference range applies only to samples taken after fasting for at least 8 hours.   BUN 01/16/2021 8  6 - 20 mg/dL Final   Creatinine, Ser 01/16/2021 0.83  0.61 - 1.24 mg/dL Final   Calcium 37/62/8315 9.3  8.9 - 10.3 mg/dL Final   Total Protein 17/61/6073 7.6  6.5 - 8.1  g/dL Final   Albumin 71/09/2692 4.0  3.5 - 5.0 g/dL Final   AST 85/46/2703 22  15 - 41 U/L Final   ALT 01/16/2021 18  0 - 44 U/L Final   Alkaline Phosphatase 01/16/2021 58  38 - 126 U/L Final   Total Bilirubin 01/16/2021 0.8  0.3 - 1.2 mg/dL Final   GFR, Estimated 01/16/2021 >60  >60 mL/min Final   Comment: (NOTE) Calculated using the CKD-EPI Creatinine Equation (2021)    Anion gap 01/16/2021 11  5 - 15 Final   Performed at [facility], [address] [place], [place] [address]   Alcohol, Ethyl (B) 01/16/2021 <10  <10 mg/dL Final   Comment: (NOTE) Lowest detectable limit for serum alcohol is 10 mg/dL.  For medical purposes only. Performed at [facility], [address] [place], [place] [address]    Salicylate Lvl 01/16/2021 <7.0 (L)  7.0 - 30.0 mg/dL Final   Performed at [facility], [address] [place], [place] [address]   Acetaminophen (Tylenol), Serum 01/16/2021 <10 (L)  10 - 30 ug/mL Final  Comment: (NOTE) Therapeutic concentrations vary significantly. A range of 10-30 ug/mL  may be an effective concentration for many patients. However, some  are best treated at concentrations outside of this range. Acetaminophen concentrations >150 ug/mL at 4 hours after ingestion  and >50 ug/mL at 12 hours after ingestion are often associated with  toxic reactions.  Performed at [facility], [address] [place], [place] [address]    WBC 01/16/2021 8.8  4.0 - 10.5 K/uL Final   RBC 01/16/2021 4.89  4.22 - 5.81 MIL/uL Final   Hemoglobin 01/16/2021 14.4  13.0 - 17.0 g/dL Final   HCT 65/78/4696 41.7  39.0 - 52.0 % Final   MCV 01/16/2021 85.3  80.0 - 100.0 fL Final   MCH 01/16/2021 29.4  26.0 - 34.0 pg Final   MCHC 01/16/2021 34.5  30.0 - 36.0 g/dL Final   RDW 29/52/8413 13.5  11.5 - 15.5 % Final   Platelets 01/16/2021 402 (H)  150 - 400 K/uL Final   nRBC 01/16/2021 0.0  0.0 - 0.2 % Final   Performed at [facility], [address] [place], [place] [address]   Tricyclic, Ur Screen 01/16/2021 NONE DETECTED  NONE DETECTED Final   Amphetamines, Ur Screen 01/16/2021 POSITIVE (A)  NONE DETECTED Final   MDMA (Ecstasy)Ur Screen 01/16/2021 NONE DETECTED  NONE DETECTED Final   Cocaine Metabolite,Ur [place] 01/16/2021 NONE DETECTED  NONE DETECTED Final   Opiate, Ur Screen 01/16/2021 NONE DETECTED  NONE DETECTED Final   Phencyclidine (PCP) Ur S 01/16/2021 NONE DETECTED  NONE DETECTED Final   Cannabinoid 50 Ng, Ur [place] 01/16/2021 NONE DETECTED  NONE DETECTED Final   Barbiturates, Ur Screen 01/16/2021 NONE DETECTED  NONE DETECTED Final   Benzodiazepine, Ur Scrn 01/16/2021 NONE DETECTED  NONE DETECTED Final   Methadone Scn, Ur 01/16/2021 NONE DETECTED  NONE DETECTED Final   Comment: (NOTE) Tricyclics + metabolites, urine    Cutoff 1000 ng/mL Amphetamines + metabolites, urine  Cutoff 1000 ng/mL MDMA (Ecstasy), urine              Cutoff 500 ng/mL Cocaine Metabolite, urine          Cutoff 300 ng/mL Opiate + metabolites, urine        Cutoff 300 ng/mL Phencyclidine (PCP), urine         Cutoff 25 ng/mL Cannabinoid, urine                 Cutoff 50 ng/mL Barbiturates + metabolites, urine  Cutoff 200 ng/mL Benzodiazepine, urine              Cutoff 200 ng/mL Methadone, urine                   Cutoff 300 ng/mL  The urine drug screen provides only a preliminary, unconfirmed analytical test result and should not be used for non-medical purposes. Clinical consideration and professional judgment should be applied to any positive drug screen result due to possible interfering substances. A more specific alternate chemical method must be used in order to obtain a confirmed analytical result. Gas chromatography / mass spectrometry (GC/MS) is the preferred confirm                          atory method. Performed at [facility], [address] [place], [place] [address]    SARS Coronavirus 2 by RT PCR 01/16/2021 NEGATIVE   NEGATIVE Final   Comment: (NOTE) SARS-CoV-2  target nucleic acids are NOT DETECTED.  The SARS-CoV-2 RNA is generally detectable in upper respiratory specimens during the acute phase of infection. The lowest concentration of SARS-CoV-2 viral copies this assay can detect is 138 copies/mL. A negative result does not preclude SARS-Cov-2 infection and should not be used as the sole basis for treatment or other patient management decisions. A negative result may occur with  improper specimen collection/handling, submission of specimen other than nasopharyngeal swab, presence of viral mutation(s) within the areas targeted by this assay, and inadequate number of viral copies(<138 copies/mL). A negative result must be combined with clinical observations, patient history, and epidemiological information. The expected result is Negative.  Fact Sheet for Patients:  BloggerCourse.com  Fact Sheet for Healthcare Providers:  SeriousBroker.it  This test is no                          t yet approved or cleared by the [place] FDA and  has been authorized for detection and/or diagnosis of SARS-CoV-2 by FDA under an Emergency Use Authorization (EUA). This EUA will remain  in effect (meaning this test can be used) for the duration of the COVID-19 declaration under Section 564(b)(1) of the Act, 21 U.S.C.section 360bbb-3(b)(1), unless the authorization is terminated  or revoked sooner.       Influenza A by PCR 01/16/2021 NEGATIVE  NEGATIVE Final   Influenza B by PCR 01/16/2021 NEGATIVE  NEGATIVE Final   Comment: (NOTE) The Xpert Xpress SARS-CoV-2/FLU/RSV plus assay is intended as an aid in the diagnosis of influenza from Nasopharyngeal swab specimens and should not be used as a sole basis for treatment. Nasal washings and aspirates are unacceptable for Xpert Xpress SARS-CoV-2/FLU/RSV testing.  Fact Sheet for  Patients: BloggerCourse.com  Fact Sheet for Healthcare Providers: SeriousBroker.it  This test is not yet approved or cleared by the [place] FDA and has been authorized for detection and/or diagnosis of SARS-CoV-2 by FDA under an Emergency Use Authorization (EUA). This EUA will remain in effect (meaning this test can be used) for the duration of the COVID-19 declaration under Section 564(b)(1) of the Act, 21 U.S.C. section 360bbb-3(b)(1), unless the authorization is terminated or revoked.  Performed at [facility], [address] [place], [place] [address]   There may be more visits with results that are not included.    Allergies: Latex  PTA Medications: (Not in a hospital admission)   Medical Decision Making  Patient is a 30 year old male with past psychiatric and medical history as stated above who presents to the [facility] as a voluntary direct admit transfer from [facility] emergency department for SI (see HPI for details).  Based on patient's current presentation of SI with intent, patient appears to be a potential danger/threat to himself at this time, and thus, patient continues to meet criteria for continuous assessment at this time.    Recommendations  Based on my evaluation the patient does not appear to have an emergency medical condition.  Patient was medically cleared in the emergency department.  Patient admitted to [facility] continuous assessment for further crisis stabilization and treatment.  Patient will be reevaluated by the treatment team on 04/16/2021 and disposition to be determined at that time.  04/14/21 ED labs reviewed:   -PCR Flu A&B, COVID: Negative  -UDS: Positive for amphetamines  -CMP: Slight hyponatremia noted with sodium slightly reduced at 134 mmol/L (essentially normal), chloride slightly reduced at 94 mmol/L, glucose slightly elevated at 119  mg/dL, total protein slightly elevated at 8.8  g/dL, AST slightly elevated at 47 U/L, ALT slightly elevated at 46 U/L.  Based on patient's current presentation, I do not suspect that these lab values are indicative of an emergent medical process at this time.  CMP otherwise unremarkable.  -Ethanol: Less than 10 mg/dL/within normal limits  -Salicylate level: Less than 7.0 mg/dL/within normal limits  -Acetaminophen level less than 10 ug/mL/within normal limits  -CBC: Leukocytosis noted with White blood cell count elevated at 12.6 K/uL.  Platelet count elevated at 481 K/uL.  Leukocytosis likely secondary to patient's diagnosed upper respiratory infection.  Do not suspect that these laboratory values are indicative of an emergent medical process at this time.  CBC otherwise unremarkable.  Patient not taking any home medications at this time.  Will order patient's previous home medications of albuterol inhaler 2 puffs inhalation every 4 hours as needed for wheezing/shortness of breath in case patient develops worsening upper respiratory symptoms.  We will continue the medications that were reinitiated for the patient in the [facility] ED at this time:  -Zoloft 50 mg p.o. daily at bedtime for MDD  -Vistaril 25 mg p.o. 3 times daily as needed for anxiety  -Trazodone 50 mg p.o. at bedtime as needed for sleep  Will not reinitiate Seroquel at this time due to patient's lack of psychotic symptoms at this time.  Tylenol 650 mg p.o. every 6 hours as needed ordered for mild pain/headache  Sodium chloride 0.65% nasal spray (1 spray each nare) as needed ordered for congestion  Jaclyn Shaggy, PA-C 04/15/21  11:02 PM

## 2021-04-15 NOTE — Progress Notes (Signed)
Patient admitted to observation unit, denies SI and HI at this time. Oriented to unit given dinner. Patient does not appear to be responding to internal stimuli. Patient will continue to be observed by nursing staff.

## 2021-04-15 NOTE — ED Notes (Signed)
Patient's pocket knife and lighter given to safe transport driver.Other personal belongings given to patient. Patient then proceeded to give his wallet to his wife.

## 2021-04-15 NOTE — ED Notes (Signed)
Pt A&O x 4, sleeping at present, no distress noted. Calm & cooperative.  Monitoring for safety.

## 2021-04-15 NOTE — Consult Note (Addendum)
Telepsych Consultation   Reason for Consult:  Suicidal ideation Referring Physician:  Shelby Dubin PA-C Location of Patient:  [facility] emergency room Location of Provider: [facility]  Patient Identification: Nathan Koch MRN:  [id number] Principal Diagnosis: <principal problem not specified> Diagnosis:  Active Problems:   Suicidal ideation   Total Time spent with patient: 30 minutes  Subjective:   Nathan Koch is a 30 y.o. male patient admitted with methamphetamine use disorder, homelessness and suicidal ideation. Patient was seen, chart reviewed and case discussed with the treatment team and Dr Dwyane Dee. Patient initially came to the emergency room for evaluation of URI. Patient stated to the PA that he was having intermittent SI without a plan.  Patient was seen this morning for evaluation of SI. Patient stated he had been living at an [facility] in [place] and working at [employer] on [address] until 3 days ago. He stated he was triggered by work and rent stress and relapsed on meth. Patient's story is conflicting. When questioned further about paying rent when he is working, he stated he has to pay 125$ per week to live at [facility]. He stated he was making $270 per week at work. Patient was unable to fully rationalize his rent issue. When asked about work stress he stated "I like my job, it was not causing me any stress."   Patient is well known to [facility] and has been admitted twice this year, he has been seen at [facility] several times, he has been he was admitted at [facility] in September and has had 21 ed visits in 6 months. Patient is known to be non-compliant with medications after he is discharged. Patient stated to the TTS clinician early this morning that he was SI with a plan to overdose on pills. When questioned about this he stated he took #5 Seroquel 50 mg pills yesterday then walked to the emergency room. He did not mention this to the PA who saw him  for his URI. Patient has a high degree of secondary gain by seeking shelter in the emergency room or inpatient hospital. Patient gave verbal permission to speak with his wife, listed in his emergency contacts in [place]. This provider has placed rehab resources and shelter resources in patient discharge paperwork. Patient has expressed an interest in substance abuse residential rehab.   He is alert & oriented x 4; calm & cooperative; his mood is congruent with affect.  He is speaking in a clear tone at moderate volume with normal pace. He maintains good eye contact.  His thought process is coherent and relevant. There is no indication that he is currently responding to internal/external stimuli or experiencing delusional thought content; and she/he has denied suicidal/self-harm/homicidal ideation, psychosis, and paranoia. Patient has been accepted to an observation bed at [facility] [address] [place], [place] [address].  He will need to be transported via [employer].    Collateral from wife, Dale Clayton [phone]: She stated that Nathan Koch's addiction is wearing everyone down. She stated he was at an [place] and working at [employer] but he started using again and got kicked out about 2 weeks ago. She stated Watts blew his paycheck on drugs when he was supposed to be helping her with their daughter. She stated Grisham can not live with her because she lives with her mother and he is not allowed to live in her house. She stated "I told Nathan Koch on the  phone today that he has to grow up and get his addiction under control."   Past Psychiatric History: MDD, Methamphetamine use disorder  Risk to Self:   Risk to Others:   Prior Inpatient Therapy:  Yes Prior Outpatient Therapy:  Yes  Past Medical History:  Past Medical History:  Diagnosis Date   Anxiety    Anxiety disorder    Asthma    Bipolar 1 disorder ([name])    Depression    Methamphetamine abuse ([name])    PTSD  (post-traumatic stress disorder)     Past Surgical History:  Procedure Laterality Date   CLOSED REDUCTION MANDIBLE N/A 07/02/2018   Procedure: CLOSED REDUCTION MANDIBULAR;  Surgeon: Vernie Murders, MD;  Location: [facility] ORS;  Service: ENT;  Laterality: N/A;   Family History:  Family History  Problem Relation Age of Onset   Asthma Mother    Cancer Mother        breast cancer   Ulcers Mother    Cancer Father        esophogeal cancer   Hypertension Father    Asthma Brother    Family Psychiatric  History: Unknown Social History:  Social History   Substance and Sexual Activity  Alcohol Use Not Currently   Alcohol/week: 13.0 standard drinks   Types: 6 Cans of beer, 7 Standard drinks or equivalent per week   Comment: previously heavy drinker 2016. most recent 4 beer/ day drinker and none since 01-11-2020     Social History   Substance and Sexual Activity  Drug Use Yes   Types: Methamphetamines   Comment: states he uses 1 g every other day    Social History   Socioeconomic History   Marital status: Single    Spouse name: Not on file   Number of children: 1   Years of education: 10   Highest education level: 10th grade  Occupational History   Not on file  Tobacco Use   Smoking status: Every Day    Packs/day: 0.50    Years: 8.00    Pack years: 4.00    Types: Cigarettes   Smokeless tobacco: Never  Vaping Use   Vaping Use: Every day   Substances: Nicotine, Flavoring  Substance and Sexual Activity   Alcohol use: Not Currently    Alcohol/week: 13.0 standard drinks    Types: 6 Cans of beer, 7 Standard drinks or equivalent per week    Comment: previously heavy drinker 2016. most recent 4 beer/ day drinker and none since 01-11-2020   Drug use: Yes    Types: Methamphetamines    Comment: states he uses 1 g every other day   Sexual activity: Not on file  Other Topics Concern   Not on file  Social History Narrative   Not on file   Social Determinants of Health    Financial Resource Strain: Not on file  Food Insecurity: Not on file  Transportation Needs: Not on file  Physical Activity: Not on file  Stress: Not on file  Social Connections: Not on file   Additional Social History:    Allergies:   Allergies  Allergen Reactions   Latex Hives    Labs:  Results for orders placed or performed during the hospital encounter of 04/14/21 (from the past 48 hour(s))  Resp Panel by RT-PCR (Flu A&B, Covid) Nasopharyngeal Swab     Status: None   Collection Time: 04/14/21  7:31 PM   Specimen: Nasopharyngeal Swab; Nasopharyngeal(NP) swabs in vial transport medium  Result Value  Ref Range   SARS Coronavirus 2 by RT PCR NEGATIVE NEGATIVE    Comment: (NOTE) SARS-CoV-2 target nucleic acids are NOT DETECTED.  The SARS-CoV-2 RNA is generally detectable in upper respiratory specimens during the acute phase of infection. The lowest concentration of SARS-CoV-2 viral copies this assay can detect is 138 copies/mL. A negative result does not preclude SARS-Cov-2 infection and should not be used as the sole basis for treatment or other patient management decisions. A negative result may occur with  improper specimen collection/handling, submission of specimen other than nasopharyngeal swab, presence of viral mutation(s) within the areas targeted by this assay, and inadequate number of viral copies(<138 copies/mL). A negative result must be combined with clinical observations, patient history, and epidemiological information. The expected result is Negative.  Fact Sheet for Patients:  EntrepreneurPulse.com.au  Fact Sheet for Healthcare Providers:  IncredibleEmployment.be  This test is no t yet approved or cleared by the [place] FDA and  has been authorized for detection and/or diagnosis of SARS-CoV-2 by FDA under an Emergency Use Authorization (EUA). This EUA will remain  in effect (meaning this test can be used) for  the duration of the COVID-19 declaration under Section 564(b)(1) of the Act, 21 U.S.C.section 360bbb-3(b)(1), unless the authorization is terminated  or revoked sooner.       Influenza A by PCR NEGATIVE NEGATIVE   Influenza B by PCR NEGATIVE NEGATIVE    Comment: (NOTE) The Xpert Xpress SARS-CoV-2/FLU/RSV plus assay is intended as an aid in the diagnosis of influenza from Nasopharyngeal swab specimens and should not be used as a sole basis for treatment. Nasal washings and aspirates are unacceptable for Xpert Xpress SARS-CoV-2/FLU/RSV testing.  Fact Sheet for Patients: EntrepreneurPulse.com.au  Fact Sheet for Healthcare Providers: IncredibleEmployment.be  This test is not yet approved or cleared by the [place] FDA and has been authorized for detection and/or diagnosis of SARS-CoV-2 by FDA under an Emergency Use Authorization (EUA). This EUA will remain in effect (meaning this test can be used) for the duration of the COVID-19 declaration under Section 564(b)(1) of the Act, 21 U.S.C. section 360bbb-3(b)(1), unless the authorization is terminated or revoked.  Performed at [facility], [address] [place], [place] [address]   Rapid urine drug screen (hospital performed)     Status: Abnormal   Collection Time: 04/14/21  7:32 PM  Result Value Ref Range   Opiates NONE DETECTED NONE DETECTED   Cocaine NONE DETECTED NONE DETECTED   Benzodiazepines NONE DETECTED NONE DETECTED   Amphetamines POSITIVE (A) NONE DETECTED   Tetrahydrocannabinol NONE DETECTED NONE DETECTED   Barbiturates NONE DETECTED NONE DETECTED    Comment: (NOTE) DRUG SCREEN FOR MEDICAL PURPOSES ONLY.  IF CONFIRMATION IS NEEDED FOR ANY PURPOSE, NOTIFY LAB WITHIN 5 DAYS.  LOWEST DETECTABLE LIMITS FOR URINE DRUG SCREEN Drug Class                     Cutoff (ng/mL) Amphetamine and metabolites    1000 Barbiturate and metabolites    200 Benzodiazepine                  [id number] Tricyclics and metabolites     300 Opiates and metabolites        300 Cocaine and metabolites        300 THC                            50 Performed at  [address] [place], [place] [address]   Comprehensive metabolic panel     Status: Abnormal   Collection Time: 04/14/21  7:48 PM  Result Value Ref Range   Sodium 134 (L) 135 - 145 mmol/L   Potassium 3.6 3.5 - 5.1 mmol/L   Chloride 94 (L) 98 - 111 mmol/L   CO2 28 22 - 32 mmol/L   Glucose, Bld 119 (H) 70 - 99 mg/dL    Comment: Glucose reference range applies only to samples taken after fasting for at least 8 hours.   BUN 14 6 - 20 mg/dL   Creatinine, Ser 0.77 0.61 - 1.24 mg/dL   Calcium 9.7 8.9 - 10.3 mg/dL   Total Protein 8.8 (H) 6.5 - 8.1 g/dL   Albumin 4.4 3.5 - 5.0 g/dL   AST 47 (H) 15 - 41 U/L   ALT 46 (H) 0 - 44 U/L   Alkaline Phosphatase 71 38 - 126 U/L   Total Bilirubin 1.0 0.3 - 1.2 mg/dL   GFR, Estimated >60 >60 mL/min    Comment: (NOTE) Calculated using the CKD-EPI Creatinine Equation (2021)    Anion gap 12 5 - 15    Comment: Performed at [facility], [address], [place], [place] [address]  Ethanol     Status: None   Collection Time: 04/14/21  7:48 PM  Result Value Ref Range   Alcohol, Ethyl (B) <10 <10 mg/dL    Comment: (NOTE) Lowest detectable limit for serum alcohol is 10 mg/dL.  For medical purposes only. Performed at [facility], [address] [place], [place] [id number]   Salicylate level     Status: Abnormal   Collection Time: 04/14/21  7:48 PM  Result Value Ref Range   Salicylate Lvl [id number] (L) 7.0 - 30.0 mg/dL    Comment: Performed at [facility], [address] [place], [place] [address]  Acetaminophen level     Status: Abnormal   Collection Time: 04/14/21  7:48 PM  Result Value Ref Range   Acetaminophen (Tylenol), Serum <10 (L) 10 - 30 ug/mL    Comment: (NOTE) Therapeutic concentrations vary significantly. A range of 10-30 ug/mL  may be an effective  concentration for many patients. However, some  are best treated at concentrations outside of this range. Acetaminophen concentrations >150 ug/mL at 4 hours after ingestion  and >50 ug/mL at 12 hours after ingestion are often associated with  toxic reactions.  Performed at [facility], [address] [place], [place] [address]   cbc     Status: Abnormal   Collection Time: 04/14/21  7:48 PM  Result Value Ref Range   WBC 12.6 (H) 4.0 - 10.5 K/uL   RBC 5.35 4.22 - 5.81 MIL/uL   Hemoglobin 15.7 13.0 - 17.0 g/dL   HCT 46.9 39.0 - 52.0 %   MCV 87.7 80.0 - 100.0 fL   MCH 29.3 26.0 - 34.0 pg   MCHC 33.5 30.0 - 36.0 g/dL   RDW 13.5 11.5 - 15.5 %   Platelets 481 (H) 150 - 400 K/uL   nRBC 0.0 0.0 - 0.2 %    Comment: Performed at [facility], [address], [place], [place] [address]    Medications:  No current facility-administered medications for this encounter.   Current Outpatient Medications  Medication Sig Dispense Refill   acetaminophen (TYLENOL) 500 MG tablet Take 1,000 mg by mouth every 6 (six) hours as needed for moderate pain or headache.  loperamide (IMODIUM) 2 MG capsule Take 1 capsule (2 mg total) by mouth 4 (four) times daily as needed for diarrhea or loose stools. 12 capsule 0   albuterol (VENTOLIN HFA) 108 (90 Base) MCG/ACT inhaler Inhale 2 puffs into the lungs every 4 (four) hours as needed for wheezing or shortness of breath. (Patient not taking: Reported on 02/12/2021) 6.7 g 1   QUEtiapine (SEROQUEL) 50 MG tablet Take 1 tablet (50 mg total) by mouth at bedtime. (Patient not taking: Reported on 04/15/2021) 30 tablet 0   sertraline (ZOLOFT) 50 MG tablet Take 1 tablet (50 mg total) by mouth at bedtime. (Patient not taking: Reported on 04/15/2021) 7 tablet 0    Musculoskeletal: Strength & Muscle Tone: within normal limits Gait & Station: normal Patient leans: N/A  Psychiatric Specialty Exam:  Presentation  General Appearance: Disheveled  Eye  Contact:Good  Speech:Clear and Coherent  Speech Volume:Normal  Handedness:Right  Mood and Affect  Mood:Depressed; Hopeless  Affect:Congruent; Depressed  Thought Process  Thought Processes:Coherent  Descriptions of Associations:Intact  Orientation:Full (Time, Place and Person)  Thought Content:Logical  History of Schizophrenia/Schizoaffective disorder:No  Duration of Psychotic Symptoms:Less than six months  Hallucinations:Hallucinations: None  Ideas of Reference:None  Suicidal Thoughts:Suicidal Thoughts: Yes, Passive SI Passive Intent and/or Plan: Without Intent; Without Plan  Homicidal Thoughts:Homicidal Thoughts: No  Sensorium  Memory:Immediate Good; Recent Good; Remote Fair  Judgment:Fair  Insight:Present  Executive Functions  Concentration:Good  Attention Span:Good  Recall:Good  Fund of Knowledge:Good  Language:Good  Psychomotor Activity  Psychomotor Activity:Psychomotor Activity: Normal  Assets  Assets:Communication Skills; Resilience; Social Support  Sleep  Sleep:Sleep: Fair  Physical Exam: Physical Exam Vitals and nursing note reviewed.  Constitutional:      Appearance: Normal appearance.  HENT:     Head: Normocephalic.  Pulmonary:     Effort: Pulmonary effort is normal.  Musculoskeletal:        General: Normal range of motion.     Cervical back: Normal range of motion.  Neurological:     General: No focal deficit present.     Mental Status: He is alert and oriented to person, place, and time.  Psychiatric:        Attention and Perception: Attention normal. He does not perceive auditory or visual hallucinations.        Mood and Affect: Mood is depressed.        Speech: Speech normal.        Behavior: Behavior normal. Behavior is cooperative.        Thought Content: Thought content is not paranoid or delusional. Thought content includes suicidal (passive, chronic and situational) ideation. Thought content does not include  homicidal ideation. Thought content does not include homicidal or suicidal plan.        Cognition and Memory: Cognition normal.   Review of Systems  Constitutional:  Negative for fever.  HENT:  Positive for congestion. Negative for sore throat.   Respiratory:  Negative for cough.   Cardiovascular:  Negative for chest pain.  Gastrointestinal: Negative.   Musculoskeletal: Negative.   Neurological: Negative.   Psychiatric/Behavioral:  Positive for depression, substance abuse and suicidal ideas.    Blood pressure 122/68, pulse 73, temperature 98.5 F (36.9 C), temperature source Oral, resp. rate 16, height 6' (1.829 m), weight 95.3 kg, SpO2 98 %. Body mass index is 28.48 kg/m.  Treatment Plan Summary: Daily contact with patient to assess and evaluate symptoms and progress in treatment and Medication management  Disposition:  [facility]  continued observation. Patient will be transported via [facility].  DR Kathrynn Humble and Aliene Beams, RN made aware of transfer via Epic secure chat message.   This service was provided via telemedicine using a 2-way, interactive audio and video technology.  Names of all persons participating in this telemedicine service and their role in this encounter. Name: Dameion Yarter Role: Patient  Name: Jinny Blossom  Role: PMHNP-BC  Name: Hampton Abbot Role: Attending MD  Name:  Role:     Ethelene Hal, NP 04/15/2021 3:21 PM

## 2021-04-15 NOTE — BH Assessment (Signed)
Comprehensive Clinical Assessment (CCA) Note  04/15/2021 [facility] TO:[id number]   Disposition:  [name], NP recommended Inpatient. Disposition SW to seek placement in the morning.   The patient demonstrates the following risk factors for suicide: Chronic risk factors for suicide include: psychiatric disorder of Bipolar Disorder and substance use disorder. Acute risk factors for suicide include:  Relapse . Protective factors for this patient include:  pt denies . Considering these factors, the overall suicide risk at this point appears to be high. Patient is not appropriate for outpatient follow up.   [facility] from 04/14/2021 in [facility] from 04/05/2021 in [facility] from 02/22/2021 in [facility] CATEGORY High Risk No Risk No Risk         Pt is a 30 year old male reporting to the [facility] due to suicidal thoughts with a plan to overdose. Pt reports he walked to the [facility] due to feeling sick. Pt reports being stressed due to relapsing on methamphetamines. Pt reports " Last night I took Seroquel". Pt reports a hx of bipolar and reports being non med compliant. Pt denies HI. Pt denies access to weapons. Pt denied legal issues. Pt reports AVH of hearing voices and seeing people. Pt reports a hx of IP Psych treatment at [facility], and [facility].    Dispositioned with [name], NP recommended Inpatient. Disposition SW to seek placement in the morning.     Chief Complaint:  Chief Complaint  Patient presents with   V70.1   Visit Diagnosis: Bipolar Disorder, Unspecified    CCA Screening, Triage and Referral (STR)  Patient Reported Information How did you hear about [place]? Self  What Is the Reason for Your Visit/Call Today? Pt presented to the [facility] due to suicidal thoughts with a plan to overdose. Patient reports " I took seroquel last  night".  How Long Has This Been Causing You Problems? <Week  What Do You Feel Would Help You the Most Today? Treatment for Depression or other mood problem; Alcohol or Drug Use Treatment   Have You Recently Had Any Thoughts About Hurting Yourself? Yes  Are You Planning to Commit Suicide/Harm Yourself At This time? No   Have you Recently Had Thoughts About [place]? No  Are You Planning to Harm Someone at This Time? No  Explanation: No data recorded  Have You Used Any Alcohol or Drugs in the Past 24 Hours? No  How Long Ago Did You Use Drugs or Alcohol? No data recorded What Did You Use and How Much? Pt used 1 gram of methamphetamine on Monday, October 17   Do You Currently Have a Therapist/Psychiatrist? No  Name of Therapist/Psychiatrist: [facility] in [place] Recently Discharged From Any Mudlogger or Programs? No  Explanation of Discharge From Practice/Program: N/A, though pt states he does not want to return to [facility] Screening Triage Referral Assessment Type of Contact: Tele-Assessment  Telemedicine Service Delivery:   Is this Initial or Reassessment? Initial Assessment  Date Telepsych consult ordered in [facility]:  04/15/21  Time Telepsych consult ordered in [facility]:  0116  Location of Assessment: [facility]  Provider Location: [facility] Assessment Services   Collateral Involvement: n/a   Does Patient Have a Stage manager Guardian? No data recorded Name and Contact of Legal Guardian: No data recorded If Minor and Not  Living with Parent(s), Who has Custody? n/a  Is CPS involved or ever been involved? Never  Is APS involved or ever been involved? Never   Patient Determined To Be At Risk for Harm To Self or Others Based on Review of Patient Reported Information or Presenting Complaint? Yes, for Self-Harm  Method: No data recorded Availability of Means: No data recorded Intent: No data recorded Notification  Required: No data recorded Additional Information for Danger to Others Potential: No data recorded Additional Comments for Danger to Others Potential: No data recorded Are There Guns or Other Weapons in Your Home? No data recorded Types of Guns/Weapons: No data recorded Are These Weapons Safely Secured?                            No data recorded Who Could Verify You Are Able To Have These Secured: No data recorded Do You Have any Outstanding Charges, Pending Court Dates, Parole/Probation? No data recorded Contacted To Inform of Risk of Harm To Self or Others: Other: Comment (pt is in the [facility].)    Does Patient Present under Involuntary Commitment? No  IVC Papers Initial File Date: 12/14/20   [place] of Residence: [facility]   Patient Currently Receiving the Following Services: -- (pt denied)   Determination of Need: Emergent (2 hours)   Options For Referral: Inpatient Hospitalization; Medication Management     CCA Biopsychosocial Patient Reported Schizophrenia/Schizoaffective Diagnosis in Past: No   Strengths: pt reports family   Mental Health Symptoms Depression:   Hopelessness; Worthlessness; Tearfulness; Change in energy/activity   Duration of Depressive symptoms:  Duration of Depressive Symptoms: Less than two weeks   Mania:   None   Anxiety:    Difficulty concentrating; Irritability; Restlessness   Psychosis:   Hallucinations   Duration of Psychotic symptoms:  Duration of Psychotic Symptoms: Less than six months   Trauma:   None   Obsessions:   None   Compulsions:   None   Inattention:   None   Hyperactivity/Impulsivity:   None   Oppositional/Defiant Behaviors:   None   Emotional Irregularity:   Mood lability   Other Mood/Personality Symptoms:   pt denied    Mental Status Exam Appearance and self-care  Stature:   Average   Weight:   Average weight   Clothing:   -- (pt is in hospital scrubs)   Grooming:   Neglected    Cosmetic use:   None   Posture/gait:   Normal   Motor activity:   Slowed   Sensorium  Attention:   Normal   Concentration:   Normal   Orientation:   X5   Recall/memory:   Defective in Immediate   Affect and Mood  Affect:   Flat   Mood:   Depressed; Worthless   Relating  Eye contact:   Fleeting   Facial expression:   Sad   Attitude toward examiner:   Cooperative   Thought and Language  Speech flow:  Slow; Slurred   Thought content:   Suspicious   Preoccupation:   None   Hallucinations:   Auditory; Visual   Organization:  No data recorded  [employer] of Knowledge:   Fair   Intelligence:   Average   Abstraction:   Normal   Judgement:   Poor   Reality Testing:   Adequate   Insight:   Poor   Decision Making:   Confused   Social Functioning  Social  Maturity:   Irresponsible   Social Judgement:   Normal   Stress  Stressors:   Other (Comment) (pt reports drugs is he stressors.)   Coping Ability:   Overwhelmed   Skill Deficits:   Self-care; Self-control   Supports:   Family     Religion: Religion/Spirituality Are You A Religious Person?: No  Leisure/Recreation: Leisure / Recreation Do You Have Hobbies?: No  Exercise/Diet: Exercise/Diet Do You Exercise?: No Have You Gained or Lost A Significant Amount of Weight in the Past Six Months?: No Do You Follow a Special Diet?: No Do You Have Any Trouble Sleeping?: No   CCA Employment/Education Employment/Work Situation: Employment / Work Situation Employment Situation: Employed Work Stressors: none per patient Patient's Job has Been Impacted by Current Illness: No Has Patient ever Been in Passenger transport manager?: No  Education: Education Is Patient Currently Attending School?: No Last Grade Completed:  (pt refused to answer) Did You Attend College?: No Did You Have An Individualized Education Program (IIEP): No Did You Have Any Difficulty At School?:  No Patient's Education Has Been Impacted by Current Illness: No   CCA Family/Childhood History Family and Relationship History: Family history Marital status: Single Does patient have children?: No  Childhood History:  Childhood History By whom was/is the patient raised?:  (unknown) Did patient suffer any verbal/emotional/physical/sexual abuse as a child?: No Did patient suffer from severe childhood neglect?: No Has patient ever been sexually abused/assaulted/raped as an adolescent or adult?: No Was the patient ever a victim of a crime or a disaster?: No Witnessed domestic violence?: No Has patient been affected by domestic violence as an adult?: No  Child/Adolescent Assessment:     CCA Substance Use Alcohol/Drug Use: Alcohol / Drug Use Pain Medications: none Prescriptions: seroquel Over the Counter: none History of alcohol / drug use?: Yes Longest period of sobriety (when/how long): unkwown Negative Consequences of Use: Financial Withdrawal Symptoms: Fever / Chills, Sweats (pt reports hallucinations.) Substance #1 Name of Substance 1: Methamphetamine 1 - Age of First Use: unknown 1 - Amount (size/oz): a gram 1 - Frequency: daily 1 - Duration: unknown 1 - Last Use / Amount: 3 days ago 1 - Method of Aquiring: unknown 1- Route of Use: oral                       ASAM's:  Six Dimensions of Multidimensional Assessment  Dimension 1:  Acute Intoxication and/or Withdrawal Potential:   Dimension 1:  Description of individual's past and current experiences of substance use and withdrawal: pt reports itching, hallucinations, sweats  Dimension 2:  Biomedical Conditions and Complications:   Dimension 2:  Description of patient's biomedical conditions and  complications: pt reports but wold not go into detail.  Dimension 3:  Emotional, Behavioral, or Cognitive Conditions and Complications:  Dimension 3:  Description of emotional, behavioral, or cognitive conditions and  complications: Pt reports bipolar.  Dimension 4:  Readiness to Change:  Dimension 4:  Description of Readiness to Change criteria: Precontemplation  Dimension 5:  Relapse, Continued use, or Continued Problem Potential:  Dimension 5:  Relapse, continued use, or continued problem potential critiera description: Pt reports continuou relapse. no supports.  Dimension 6:  Recovery/Living Environment:  Dimension 6:  Recovery/Iiving environment criteria description: Pt reports being homeless  ASAM Severity Score: ASAM's Severity Rating Score: 13  ASAM Recommended Level of Treatment: ASAM Recommended Level of Treatment: Level III Residential Treatment   Substance use Disorder (SUD) Substance Use Disorder (SUD)  Checklist Symptoms of Substance Use: Continued use despite having a persistent/recurrent physical/psychological problem caused/exacerbated by use, Continued use despite persistent or recurrent social, interpersonal problems, caused or exacerbated by use, Evidence of withdrawal (Comment), Presence of craving or strong urge to use, Substance(s) often taken in larger amounts or over longer times than was intended  Recommendations for Services/Supports/Treatments: Recommendations for Services/Supports/Treatments Recommendations For Services/Supports/Treatments: Detox, Inpatient Hospitalization  Discharge Disposition:    DSM5 Diagnoses: Patient Active Problem List   Diagnosis Date Noted   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   Severe recurrent major depression without psychotic features ([place]) 01/16/2021   Bipolar 1 disorder, depressed ([place])    MDD (major depressive disorder), recurrent episode, severe ([place]) 09/11/2020   MDD (major depressive disorder), recurrent severe, without psychosis ([place]) 09/10/2020   Depression, major, recurrent, severe with psychosis ([place]) 10/10/2019   Methamphetamine use disorder, 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/14/2019   MDD (major depressive disorder), recurrent,  severe, with psychosis ([place]) 09/14/2019   MDD (major depressive disorder), single episode, severe with psychosis ([place]) 09/14/2019   Tobacco use disorder 02/21/2019   PTSD (post-traumatic stress disorder) 02/21/2019   Benzodiazepine abuse ([place]) 11/15/2017   Asthma 06/26/2014     Referrals to Alternative Service(s): Referred to Alternative Service(s):   Place:   Date:   Time:    Referred to Alternative Service(s):   Place:   Date:   Time:    Referred to Alternative Service(s):   Place:   Date:   Time:    Referred to Alternative Service(s):   Place:   Date:   Time:     Johniece Hornbaker M. Genell Thede, Deon Pilling

## 2021-04-15 NOTE — ED Notes (Signed)
This nurse, NP and patient have called Homeless shelter multiple times in attempt to help reserve patient a bed without success.

## 2021-04-15 NOTE — ED Notes (Signed)
Per Elta Guadeloupe, NP patient is to be discharged from this ED and taken to [facility] in [place] where they will offer him observation bed overnight. States this is not a hospital to hospital transfer and patient will stay there and seek placement elsewhere on his own accord. NP states that she placed information in AVS for patient.

## 2021-04-15 NOTE — Discharge Instructions (Addendum)
Facility Based Crisis   [facility] recovery Services  [address]  [place], [place] [address] [phone]  Admission is first come first served. Please bring your ID and 3 changes of underwear. Also you must have someone with you for transportation in case you get turned away.   [facility]  [address]  [place], [place] [address] [phone] Or intake line [phone] You can call to reserve a bed with the intake line.  You must be able to enter and exit a bunk bed. You may not be a sex offender

## 2021-04-15 NOTE — ED Notes (Signed)
Patient speaking with TTS at this time.  

## 2021-04-16 ENCOUNTER — Encounter (HOSPITAL_COMMUNITY): Payer: Self-pay | Admitting: Registered Nurse

## 2021-04-16 DIAGNOSIS — F332 Major depressive disorder, recurrent severe without psychotic features: Secondary | ICD-10-CM | POA: Diagnosis not present

## 2021-04-16 DIAGNOSIS — F17210 Nicotine dependence, cigarettes, uncomplicated: Secondary | ICD-10-CM | POA: Diagnosis not present

## 2021-04-16 DIAGNOSIS — F1994 Other psychoactive substance use, unspecified with psychoactive substance-induced mood disorder: Secondary | ICD-10-CM | POA: Diagnosis not present

## 2021-04-16 DIAGNOSIS — R45851 Suicidal ideations: Secondary | ICD-10-CM | POA: Diagnosis not present

## 2021-04-16 DIAGNOSIS — Z5900 Homelessness unspecified: Secondary | ICD-10-CM

## 2021-04-16 MED ORDER — HYDROXYZINE HCL 25 MG PO TABS
25.0000 mg | ORAL_TABLET | Freq: Three times a day (TID) | ORAL | Status: DC | PRN
  Administered 2021-04-21: 14:00:00 25 mg via ORAL
  Filled 2021-04-16: qty 1
  Filled 2021-04-16: qty 21
  Filled 2021-04-16: qty 1

## 2021-04-16 MED ORDER — ALUM & MAG HYDROXIDE-SIMETH 200-200-20 MG/5ML PO SUSP: 30.0000 mL | ORAL | Status: DC | PRN

## 2021-04-16 MED ORDER — ACETAMINOPHEN 325 MG PO TABS
650.0000 mg | ORAL_TABLET | Freq: Four times a day (QID) | ORAL | Status: DC | PRN
  Administered 2021-04-18: 21:00:00 650 mg via ORAL
  Filled 2021-04-16: qty 2

## 2021-04-16 MED ORDER — SALINE SPRAY 0.65 % NA SOLN: 1.0000 | NASAL | Status: DC | PRN

## 2021-04-16 MED ORDER — MAGNESIUM HYDROXIDE 400 MG/5ML PO SUSP: 30.0000 mL | Freq: Every day | ORAL | Status: DC | PRN

## 2021-04-16 NOTE — Progress Notes (Signed)
Pt was transferred from [facility]. Pt is admitted to [facility] due passive SI. Pt verbally contracts for safety on the unit. Pt is alert and oriented with flat affect. Pt is ambulatory and is oriented to staff and unit. Pt was cooperative with skin assessment and admission process. Pt denies pain and current HI/AVH. 15 minutes checks for safety initiated. Staff will monitor for pt's safety.

## 2021-04-16 NOTE — ED Notes (Signed)
Pt is sleeping in the bed. Respirations are even and unlabored. No acute distress noted. Will continue to monitor for safety. °

## 2021-04-16 NOTE — Progress Notes (Signed)
Patient currently sleeping.  Continue to monitor for safety.

## 2021-04-16 NOTE — ED Provider Notes (Signed)
Behavioral Health Progress Note  Date and Time: 04/16/2021 12:08 PM Name: Nathan Koch MRN:  [id number]  Subjective:  "I'm still having suicidal thoughts.  I lost my home and everything"   Laural Golden, 30 y.o., male patient seen face to face by this provider, consulted with Dr. Ernie Hew; and chart reviewed on 04/16/21.  On evaluation Nathan Koch reports he continues th have suicidal thoughts that are more like no one would miss him if he was dead or doesn't care if he doesn't wake up.  At this time he states he has no plan or intent.  Patient stating he had been staying at the [facility] for 45 days when her relapsed on methamphetamine and was kicked out of home.  States he is unable to go back because owe [employer].  States at one time he was going to [facility] in [place] for outpatient psychiatric services.  "My wife and daughter are in [place].  They stay with her mother.  I would just go there, hang out at [employer], sleep in abandon houses, and go to [employer]."  Patient states that he is currently homeless in [place] now.  He denies homicidal ideation, psychosis, and paranoia.  States he is wanting to get into a rehab facility to get his life back together.   During evaluation Nathan Koch is sitting in chair with head down no acute distress noted.  He is alert, oriented x 4, calm, cooperative and attentive.   His mood is depressed with congruent affect.  He has normal speech, and behavior.  Objectively there is no evidence of psychosis/mania or delusional thinking.  Patient is able to converse coherently, goal directed thoughts, no distractibility, or pre-occupation.  He continues to deny homicidal ideation, psychosis, and paranoia; but continues to endorse passive suicidal ideation.  Patient answered question appropriately.   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[place])  Passive suicidal ideations  Homelessness    Total Time spent with patient: 20 minutes  Past Psychiatric History: See above Past Medical History:  Past Medical History:  Diagnosis Date   Anxiety    Anxiety disorder    Asthma    Bipolar 1 disorder ([place])    Depression    Methamphetamine abuse ([place])    PTSD (post-traumatic stress disorder)     Past Surgical History:  Procedure Laterality Date   CLOSED REDUCTION MANDIBLE N/A 07/02/2018   Procedure: CLOSED REDUCTION MANDIBULAR;  Surgeon: Margaretha Sheffield, MD;  Location: [facility] ORS;  Service: ENT;  Laterality: N/A;   Family History:  Family History  Problem Relation Age of Onset   Asthma Mother    Cancer Mother        breast cancer   Ulcers Mother    Cancer Father        esophogeal cancer   Hypertension Father    Asthma Brother    Family Psychiatric  History: None reported Social History:  Social History   Substance and Sexual Activity  Alcohol Use Not Currently   Alcohol/week: 13.0 standard drinks   Types: 6 Cans of beer, 7 Standard drinks or equivalent per week   Comment: previously heavy drinker 2016. most recent 4 beer/ day  drinker and none since 01-11-2020     Social History   Substance and Sexual Activity  Drug Use Yes   Types: Methamphetamines   Comment: states he uses 1 g every other day    Social History   Socioeconomic History   Marital status: Single    Spouse name: Not on file   Number of children: 1   Years of education: 10   Highest education level: 10th grade  Occupational History   Not on file  Tobacco Use   Smoking status: Every Day    Packs/day: 0.50    Years: 8.00    Pack years: 4.00    Types: Cigarettes   Smokeless tobacco:  Never  Vaping Use   Vaping Use: Every day   Substances: Nicotine, Flavoring  Substance and Sexual Activity   Alcohol use: Not Currently    Alcohol/week: 13.0 standard drinks    Types: 6 Cans of beer, 7 Standard drinks or equivalent per week    Comment: previously heavy drinker 2016. most recent 4 beer/ day drinker and none since 01-11-2020   Drug use: Yes    Types: Methamphetamines    Comment: states he uses 1 g every other day   Sexual activity: Not on file  Other Topics Concern   Not on file  Social History Narrative   Not on file   Social Determinants of Health   Financial Resource Strain: Not on file  Food Insecurity: Not on file  Transportation Needs: Not on file  Physical Activity: Not on file  Stress: Not on file  Social Connections: Not on file   SDOH:  SDOH Screenings   Alcohol Screen: Low Risk    Last Alcohol Screening Score (AUDIT): 1  Depression (PHQ2-9): Medium Risk   PHQ-2 Score: 16  Financial Resource Strain: Not on file  Food Insecurity: Not on file  Housing: Not on file  Physical Activity: Not on file  Social Connections: Not on file  Stress: Not on file  Tobacco Use: High Risk   Smoking Tobacco Use: Every Day   Smokeless Tobacco Use: Never   Passive Exposure: Not on file  Transportation Needs: Not on file   Additional Social History:                         Sleep: Fair  Appetite:  Good  Current Medications:  Current Facility-Administered Medications  Medication Dose Route Frequency Provider Last Rate Last Admin   acetaminophen (TYLENOL) tablet 650 mg  650 mg Oral Q6H PRN [place] Abts W, PA-C       albuterol (VENTOLIN HFA) 108 (90 Base) MCG/ACT inhaler 2 puff  2 puff Inhalation Q4H PRN Melbourne Abts W, PA-C       alum & mag hydroxide-simeth (MAALOX/MYLANTA) 200-200-20 MG/5ML suspension 30 mL  30 mL Oral Q4H PRN Melbourne Abts W, PA-C       hydrOXYzine (ATARAX) tablet 25 mg  25 mg Oral TID PRN Jaclyn Shaggy, PA-C   25 mg at 04/15/21  2319   magnesium hydroxide (MILK OF MAGNESIA) suspension 30 mL  30 mL Oral Daily PRN Melbourne Abts W, PA-C       sertraline (ZOLOFT) tablet 50 mg  50 mg Oral QHS Ladona Ridgel, Cody W, PA-C   50 mg at 04/15/21 2319   sodium chloride (OCEAN) 0.65 % nasal spray 1 spray  1 spray Each Nare PRN Jaclyn Shaggy, PA-C       traZODone (DESYREL) tablet  50 mg  50 mg Oral QHS PRN Jaclyn Shaggyaylor, Cody W, PA-C   50 mg at 04/15/21 2319   Current Outpatient Medications  Medication Sig Dispense Refill   acetaminophen (TYLENOL) 500 MG tablet Take 1,000 mg by mouth every 6 (six) hours as needed for moderate pain or headache.     albuterol (VENTOLIN HFA) 108 (90 Base) MCG/ACT inhaler Inhale 2 puffs into the lungs every 4 (four) hours as needed for wheezing or shortness of breath. (Patient not taking: Reported on 02/12/2021) 6.7 g 1   loperamide (IMODIUM) 2 MG capsule Take 1 capsule (2 mg total) by mouth 4 (four) times daily as needed for diarrhea or loose stools. (Patient not taking: Reported on 04/15/2021) 12 capsule 0   QUEtiapine (SEROQUEL) 50 MG tablet Take 1 tablet (50 mg total) by mouth at bedtime. (Patient not taking: Reported on 04/15/2021) 30 tablet 0   sertraline (ZOLOFT) 50 MG tablet Take 1 tablet (50 mg total) by mouth at bedtime. (Patient not taking: Reported on 04/15/2021) 7 tablet 0    Labs  Lab Results:  Admission on 04/14/2021, Discharged on 04/15/2021  Component Date Value Ref Range Status   SARS Coronavirus 2 by RT PCR 04/14/2021 NEGATIVE  NEGATIVE Final   Comment: (NOTE) SARS-CoV-2 target nucleic acids are NOT DETECTED.  The SARS-CoV-2 RNA is generally detectable in upper respiratory specimens during the acute phase of infection. The lowest concentration of SARS-CoV-2 viral copies this assay can detect is 138 copies/mL. A negative result does not preclude SARS-Cov-2 infection and should not be used as the sole basis for treatment or other patient management decisions. A negative result may occur with   improper specimen collection/handling, submission of specimen other than nasopharyngeal swab, presence of viral mutation(s) within the areas targeted by this assay, and inadequate number of viral copies(<138 copies/mL). A negative result must be combined with clinical observations, patient history, and epidemiological information. The expected result is Negative.  Fact Sheet for Patients:  BloggerCourse.comhttps://www.fda.gov/media/152166/download  Fact Sheet for Healthcare Providers:  SeriousBroker.ithttps://www.fda.gov/media/152162/download  This test is no                          t yet approved or cleared by the [place] FDA and  has been authorized for detection and/or diagnosis of SARS-CoV-2 by FDA under an Emergency Use Authorization (EUA). This EUA will remain  in effect (meaning this test can be used) for the duration of the COVID-19 declaration under Section 564(b)(1) of the Act, 21 U.S.C.section 360bbb-3(b)(1), unless the authorization is terminated  or revoked sooner.       Influenza A by PCR 04/14/2021 NEGATIVE  NEGATIVE Final   Influenza B by PCR 04/14/2021 NEGATIVE  NEGATIVE Final   Comment: (NOTE) The Xpert Xpress SARS-CoV-2/FLU/RSV plus assay is intended as an aid in the diagnosis of influenza from Nasopharyngeal swab specimens and should not be used as a sole basis for treatment. Nasal washings and aspirates are unacceptable for Xpert Xpress SARS-CoV-2/FLU/RSV testing.  Fact Sheet for Patients: BloggerCourse.comhttps://www.fda.gov/media/152166/download  Fact Sheet for Healthcare Providers: SeriousBroker.ithttps://www.fda.gov/media/152162/download  This test is not yet approved or cleared by the [place] FDA and has been authorized for detection and/or diagnosis of SARS-CoV-2 by FDA under an Emergency Use Authorization (EUA). This EUA will remain in effect (meaning this test can be used) for the duration of the COVID-19 declaration under Section 564(b)(1) of the Act, 21 U.S.C. section 360bbb-3(b)(1), unless  the authorization is terminated or revoked.  Performed at [facility], [address] [place], [place] [address]    Sodium 04/14/2021 134 (L)  135 - 145 mmol/L Final   Potassium 04/14/2021 3.6  3.5 - 5.1 mmol/L Final   Chloride 04/14/2021 94 (L)  98 - 111 mmol/L Final   CO2 04/14/2021 28  22 - 32 mmol/L Final   Glucose, Bld 04/14/2021 119 (H)  70 - 99 mg/dL Final   Glucose reference range applies only to samples taken after fasting for at least 8 hours.   BUN 04/14/2021 14  6 - 20 mg/dL Final   Creatinine, Ser 04/14/2021 0.77  0.61 - 1.24 mg/dL Final   Calcium 04/14/2021 9.7  8.9 - 10.3 mg/dL Final   Total Protein 04/14/2021 8.8 (H)  6.5 - 8.1 g/dL Final   Albumin 04/14/2021 4.4  3.5 - 5.0 g/dL Final   AST 04/14/2021 47 (H)  15 - 41 U/L Final   ALT 04/14/2021 46 (H)  0 - 44 U/L Final   Alkaline Phosphatase 04/14/2021 71  38 - 126 U/L Final   Total Bilirubin 04/14/2021 1.0  0.3 - 1.2 mg/dL Final   GFR, Estimated 04/14/2021 >60  >60 mL/min Final   Comment: (NOTE) Calculated using the CKD-EPI Creatinine Equation (2021)    Anion gap 04/14/2021 12  5 - 15 Final   Performed at [facility], [address] [place], [place] [address]   Alcohol, Ethyl (B) 04/14/2021 <10  <10 mg/dL Final   Comment: (NOTE) Lowest detectable limit for serum alcohol is 10 mg/dL.  For medical purposes only. Performed at [facility], [address] [place], [place] [id number]    Salicylate Lvl [id number] <7.0 (L)  7.0 - 30.0 mg/dL Final   Performed at [facility], [place], [place] [address]   Acetaminophen (Tylenol), Serum 04/14/2021 <10 (L)  10 - 30 ug/mL Final   Comment: (NOTE) Therapeutic concentrations vary significantly. A range of 10-30 ug/mL  may be an effective concentration for many patients. However, some  are best treated at concentrations outside of this range. Acetaminophen concentrations >150 ug/mL at 4 hours after ingestion  and >50 ug/mL at 12 hours after  ingestion are often associated with  toxic reactions.  Performed at [facility], [address] [place], [place] [address]    WBC 04/14/2021 12.6 (H)  4.0 - 10.5 K/uL Final   RBC 04/14/2021 5.35  4.22 - 5.81 MIL/uL Final   Hemoglobin 04/14/2021 15.7  13.0 - 17.0 g/dL Final   HCT 04/14/2021 46.9  39.0 - 52.0 % Final   MCV 04/14/2021 87.7  80.0 - 100.0 fL Final   MCH 04/14/2021 29.3  26.0 - 34.0 pg Final   MCHC 04/14/2021 33.5  30.0 - 36.0 g/dL Final   RDW 04/14/2021 13.5  11.5 - 15.5 % Final   Platelets 04/14/2021 481 (H)  150 - 400 K/uL Final   nRBC 04/14/2021 0.0  0.0 - 0.2 % Final   Performed at [facility], [address] [place], [place] [address]   Opiates 04/14/2021 NONE DETECTED  NONE DETECTED Final   Cocaine 04/14/2021 NONE DETECTED  NONE DETECTED Final   Benzodiazepines 04/14/2021 NONE DETECTED  NONE DETECTED Final   Amphetamines 04/14/2021 POSITIVE (A)  NONE DETECTED Final   Tetrahydrocannabinol 04/14/2021 NONE DETECTED  NONE DETECTED Final   Barbiturates 04/14/2021 NONE DETECTED  NONE DETECTED Final   Comment: (NOTE) DRUG SCREEN FOR MEDICAL PURPOSES ONLY.  IF CONFIRMATION IS NEEDED FOR ANY PURPOSE,  NOTIFY LAB WITHIN 5 DAYS.  LOWEST DETECTABLE LIMITS FOR URINE DRUG SCREEN Drug Class                     Cutoff (ng/mL) Amphetamine and metabolites    1000 Barbiturate and metabolites    200 Benzodiazepine                 [id number] Tricyclics and metabolites     300 Opiates and metabolites        300 Cocaine and metabolites        300 THC                            50 Performed at [facility], [address], [place], [place] [address]   Admission on 04/05/2021, Discharged on 04/05/2021  Component Date Value Ref Range Status   Lipase 04/05/2021 24  11 - 51 U/L Final   Performed at [facility], [place], [place] [address]   Sodium 04/05/2021 140  135 - 145 mmol/L Final   Potassium 04/05/2021 3.8  3.5 - 5.1 mmol/L Final    Chloride 04/05/2021 105  98 - 111 mmol/L Final   CO2 04/05/2021 28  22 - 32 mmol/L Final   Glucose, Bld 04/05/2021 88  70 - 99 mg/dL Final   Glucose reference range applies only to samples taken after fasting for at least 8 hours.   BUN 04/05/2021 10  6 - 20 mg/dL Final   Creatinine, Ser 04/05/2021 0.85  0.61 - 1.24 mg/dL Final   Calcium 04/05/2021 9.9  8.9 - 10.3 mg/dL Final   Total Protein 04/05/2021 7.9  6.5 - 8.1 g/dL Final   Albumin 04/05/2021 4.6  3.5 - 5.0 g/dL Final   AST 04/05/2021 13 (L)  15 - 41 U/L Final   ALT 04/05/2021 11  0 - 44 U/L Final   Alkaline Phosphatase 04/05/2021 65  38 - 126 U/L Final   Total Bilirubin 04/05/2021 0.8  0.3 - 1.2 mg/dL Final   GFR, Estimated 04/05/2021 >60  >60 mL/min Final   Comment: (NOTE) Calculated using the CKD-EPI Creatinine Equation (2021)    Anion gap 04/05/2021 7  5 - 15 Final   Performed at [facility], [place], [place], [place] [address]   WBC 04/05/2021 6.5  4.0 - 10.5 K/uL Final   RBC 04/05/2021 5.25  4.22 - 5.81 MIL/uL Final   Hemoglobin 04/05/2021 14.6  13.0 - 17.0 g/dL Final   HCT 04/05/2021 45.2  39.0 - 52.0 % Final   MCV 04/05/2021 86.1  80.0 - 100.0 fL Final   MCH 04/05/2021 27.8  26.0 - 34.0 pg Final   MCHC 04/05/2021 32.3  30.0 - 36.0 g/dL Final   RDW 04/05/2021 13.9  11.5 - 15.5 % Final   Platelets 04/05/2021 324  150 - 400 K/uL Final   nRBC 04/05/2021 0.0  0.0 - 0.2 % Final   Performed at [facility], [address], [place], [place] [address]   SARS Coronavirus 2 by RT PCR 04/05/2021 NEGATIVE  NEGATIVE Final   Comment: (NOTE) SARS-CoV-2 target nucleic acids are NOT DETECTED.  The SARS-CoV-2 RNA is generally detectable in upper respiratory specimens during the acute phase of infection. The lowest concentration of SARS-CoV-2 viral copies this assay can detect is 138 copies/mL. A negative result does not preclude SARS-Cov-2 infection and should not be used as the sole  basis for treatment or other patient management decisions. A negative result may occur with  improper specimen collection/handling, submission of specimen other than nasopharyngeal swab, presence of viral mutation(s) within the areas targeted by this assay, and inadequate number of viral copies(<138 copies/mL). A negative result must be combined with clinical observations, patient history, and epidemiological information. The expected result is Negative.  Fact Sheet for Patients:  EntrepreneurPulse.com.au  Fact Sheet for Healthcare Providers:  IncredibleEmployment.be  This test is no                          t yet approved or cleared by the [place] FDA and  has been authorized for detection and/or diagnosis of SARS-CoV-2 by FDA under an Emergency Use Authorization (EUA). This EUA will remain  in effect (meaning this test can be used) for the duration of the COVID-19 declaration under Section 564(b)(1) of the Act, 21 U.S.C.section 360bbb-3(b)(1), unless the authorization is terminated  or revoked sooner.       Influenza A by PCR 04/05/2021 NEGATIVE  NEGATIVE Final   Influenza B by PCR 04/05/2021 NEGATIVE  NEGATIVE Final   Comment: (NOTE) The Xpert Xpress SARS-CoV-2/FLU/RSV plus assay is intended as an aid in the diagnosis of influenza from Nasopharyngeal swab specimens and should not be used as a sole basis for treatment. Nasal washings and aspirates are unacceptable for Xpert Xpress SARS-CoV-2/FLU/RSV testing.  Fact Sheet for Patients: EntrepreneurPulse.com.au  Fact Sheet for Healthcare Providers: IncredibleEmployment.be  This test is not yet approved or cleared by the [place] FDA and has been authorized for detection and/or diagnosis of SARS-CoV-2 by FDA under an Emergency Use Authorization (EUA). This EUA will remain in effect (meaning this test can be used) for the duration of  the COVID-19 declaration under Section 564(b)(1) of the Act, 21 U.S.C. section 360bbb-3(b)(1), unless the authorization is terminated or revoked.  Performed at [facility], [address], [place], [place] [address]   Admission on 02/22/2021, Discharged on 02/22/2021  Component Date Value Ref Range Status   Sodium 02/22/2021 133 (L)  135 - 145 mmol/L Final   Potassium 02/22/2021 3.8  3.5 - 5.1 mmol/L Final   Chloride 02/22/2021 93 (L)  98 - 111 mmol/L Final   CO2 02/22/2021 30  22 - 32 mmol/L Final   Glucose, Bld 02/22/2021 116 (H)  70 - 99 mg/dL Final   Glucose reference range applies only to samples taken after fasting for at least 8 hours.   BUN 02/22/2021 26 (H)  6 - 20 mg/dL Final   Creatinine, Ser 02/22/2021 0.95  0.61 - 1.24 mg/dL Final   Calcium 02/22/2021 9.7  8.9 - 10.3 mg/dL Final   Total Protein 02/22/2021 8.6 (H)  6.5 - 8.1 g/dL Final   Albumin 02/22/2021 4.7  3.5 - 5.0 g/dL Final   AST 02/22/2021 51 (H)  15 - 41 U/L Final   ALT 02/22/2021 34  0 - 44 U/L Final   Alkaline Phosphatase 02/22/2021 70  38 - 126 U/L Final   Total Bilirubin 02/22/2021 2.6 (H)  0.3 - 1.2 mg/dL Final   GFR, Estimated 02/22/2021 >60  >60 mL/min Final   Comment: (NOTE) Calculated using the CKD-EPI Creatinine Equation (2021)    Anion gap 02/22/2021 10  5 - 15 Final   Performed at [facility], [address] [place], [place] [address]   Alcohol, Ethyl (B) 02/22/2021 <10  <10 mg/dL Final   Comment: (  NOTE) Lowest detectable limit for serum alcohol is 10 mg/dL.  For medical purposes only. Performed at [facility], [address] [place], [place] [address]    WBC 02/22/2021 11.4 (H)  4.0 - 10.5 K/uL Final   RBC 02/22/2021 5.00  4.22 - 5.81 MIL/uL Final   Hemoglobin 02/22/2021 14.6  13.0 - 17.0 g/dL Final   HCT 02/22/2021 41.4  39.0 - 52.0 % Final   MCV 02/22/2021 82.8  80.0 - 100.0 fL Final   MCH 02/22/2021 29.2  26.0 - 34.0 pg Final   MCHC  02/22/2021 35.3  30.0 - 36.0 g/dL Final   RDW 02/22/2021 13.1  11.5 - 15.5 % Final   Platelets 02/22/2021 399  150 - 400 K/uL Final   nRBC 02/22/2021 0.0  0.0 - 0.2 % Final   Performed at [facility], [address] [place], [place] [id number]   Tricyclic, Ur Screen 0000000 NONE DETECTED  NONE DETECTED Final   Amphetamines, Ur Screen 02/22/2021 POSITIVE (A)  NONE DETECTED Final   MDMA (Ecstasy)Ur Screen 02/22/2021 NONE DETECTED  NONE DETECTED Final   Cocaine Metabolite,Ur [place] 02/22/2021 NONE DETECTED  NONE DETECTED Final   Opiate, Ur Screen 02/22/2021 NONE DETECTED  NONE DETECTED Final   Phencyclidine (PCP) Ur S 02/22/2021 NONE DETECTED  NONE DETECTED Final   Cannabinoid 50 Ng, Ur [place] 02/22/2021 POSITIVE (A)  NONE DETECTED Final   Barbiturates, Ur Screen 02/22/2021 NONE DETECTED  NONE DETECTED Final   Benzodiazepine, Ur Scrn 02/22/2021 NONE DETECTED  NONE DETECTED Final   Methadone Scn, Ur 02/22/2021 NONE DETECTED  NONE DETECTED Final   Comment: (NOTE) Tricyclics + metabolites, urine    Cutoff 1000 ng/mL Amphetamines + metabolites, urine  Cutoff 1000 ng/mL MDMA (Ecstasy), urine              Cutoff 500 ng/mL Cocaine Metabolite, urine          Cutoff 300 ng/mL Opiate + metabolites, urine        Cutoff 300 ng/mL Phencyclidine (PCP), urine         Cutoff 25 ng/mL Cannabinoid, urine                 Cutoff 50 ng/mL Barbiturates + metabolites, urine  Cutoff 200 ng/mL Benzodiazepine, urine              Cutoff 200 ng/mL Methadone, urine                   Cutoff 300 ng/mL  The urine drug screen provides only a preliminary, unconfirmed analytical test result and should not be used for non-medical purposes. Clinical consideration and professional judgment should be applied to any positive drug screen result due to possible interfering substances. A more specific alternate chemical method must be used in order to obtain a confirmed analytical result. Gas chromatography / mass  spectrometry (GC/MS) is the preferred confirm                          atory method. Performed at [facility], [address] [place], [place] [address]   Admission on 02/18/2021, Discharged on 02/18/2021  Component Date Value Ref Range Status   WBC 02/18/2021 15.0 (H)  4.0 - 10.5 K/uL Final   RBC 02/18/2021 5.61  4.22 - 5.81 MIL/uL Final   Hemoglobin 02/18/2021 15.9  13.0 - 17.0 g/dL Final   HCT 02/18/2021 46.8  39.0 - 52.0 % Final  MCV 02/18/2021 83.4  80.0 - 100.0 fL Final   MCH 02/18/2021 28.3  26.0 - 34.0 pg Final   MCHC 02/18/2021 34.0  30.0 - 36.0 g/dL Final   RDW 02/18/2021 13.3  11.5 - 15.5 % Final   Platelets 02/18/2021 430 (H)  150 - 400 K/uL Final   nRBC 02/18/2021 0.0  0.0 - 0.2 % Final   Neutrophils Relative % 02/18/2021 74  % Final   Neutro Abs 02/18/2021 11.1 (H)  1.7 - 7.7 K/uL Final   Lymphocytes Relative 02/18/2021 17  % Final   Lymphs Abs 02/18/2021 2.5  0.7 - 4.0 K/uL Final   Monocytes Relative 02/18/2021 9  % Final   Monocytes Absolute 02/18/2021 1.3 (H)  0.1 - 1.0 K/uL Final   Eosinophils Relative 02/18/2021 0  % Final   Eosinophils Absolute 02/18/2021 0.0  0.0 - 0.5 K/uL Final   Basophils Relative 02/18/2021 0  % Final   Basophils Absolute 02/18/2021 0.1  0.0 - 0.1 K/uL Final   Immature Granulocytes 02/18/2021 0  % Final   Abs Immature Granulocytes 02/18/2021 0.04  0.00 - 0.07 K/uL Final   Performed at [facility], [address] [place], [place] [address]   Sodium 02/18/2021 136  135 - 145 mmol/L Final   Potassium 02/18/2021 3.8  3.5 - 5.1 mmol/L Final   Chloride 02/18/2021 99  98 - 111 mmol/L Final   CO2 02/18/2021 24  22 - 32 mmol/L Final   Glucose, Bld 02/18/2021 139 (H)  70 - 99 mg/dL Final   Glucose reference range applies only to samples taken after fasting for at least 8 hours.   BUN 02/18/2021 18  6 - 20 mg/dL Final   Creatinine, Ser 02/18/2021 1.26 (H)  0.61 - 1.24 mg/dL Final   Calcium 02/18/2021 10.3  8.9 - 10.3 mg/dL  Final   Total Protein 02/18/2021 9.7 (H)  6.5 - 8.1 g/dL Final   Albumin 02/18/2021 5.2 (H)  3.5 - 5.0 g/dL Final   AST 02/18/2021 52 (H)  15 - 41 U/L Final   ALT 02/18/2021 24  0 - 44 U/L Final   Alkaline Phosphatase 02/18/2021 83  38 - 126 U/L Final   Total Bilirubin 02/18/2021 1.9 (H)  0.3 - 1.2 mg/dL Final   GFR, Estimated 02/18/2021 >60  >60 mL/min Final   Comment: (NOTE) Calculated using the CKD-EPI Creatinine Equation (2021)    Anion gap 02/18/2021 13  5 - 15 Final   Performed at [facility], [address] [place], [place] [address]   Lipase 02/18/2021 24  11 - 51 U/L Final   Performed at [facility], [address] [place], [name] [address]   SARS Coronavirus 2 by RT PCR 02/18/2021 NEGATIVE  NEGATIVE Final   Comment: (NOTE) SARS-CoV-2 target nucleic acids are NOT DETECTED.  The SARS-CoV-2 RNA is generally detectable in upper respiratory specimens during the acute phase of infection. The lowest concentration of SARS-CoV-2 viral copies this assay can detect is 138 copies/mL. A negative result does not preclude SARS-Cov-2 infection and should not be used as the sole basis for treatment or other patient management decisions. A negative result may occur with  improper specimen collection/handling, submission of specimen other than nasopharyngeal swab, presence of viral mutation(s) within the areas targeted by this assay, and inadequate number of viral copies(<138 copies/mL). A negative result must be combined with clinical observations, patient history, and epidemiological information. The expected result is Negative.  Fact Sheet  for Patients:  EntrepreneurPulse.com.au  Fact Sheet for Healthcare Providers:  IncredibleEmployment.be  This test is no                          t yet approved or cleared by the [place] FDA and  has been authorized for detection and/or diagnosis of SARS-CoV-2 by FDA under an  Emergency Use Authorization (EUA). This EUA will remain  in effect (meaning this test can be used) for the duration of the COVID-19 declaration under Section 564(b)(1) of the Act, 21 U.S.C.section 360bbb-3(b)(1), unless the authorization is terminated  or revoked sooner.       Influenza A by PCR 02/18/2021 NEGATIVE  NEGATIVE Final   Influenza B by PCR 02/18/2021 NEGATIVE  NEGATIVE Final   Comment: (NOTE) The Xpert Xpress SARS-CoV-2/FLU/RSV plus assay is intended as an aid in the diagnosis of influenza from Nasopharyngeal swab specimens and should not be used as a sole basis for treatment. Nasal washings and aspirates are unacceptable for Xpert Xpress SARS-CoV-2/FLU/RSV testing.  Fact Sheet for Patients: EntrepreneurPulse.com.au  Fact Sheet for Healthcare Providers: IncredibleEmployment.be  This test is not yet approved or cleared by the [place] FDA and has been authorized for detection and/or diagnosis of SARS-CoV-2 by FDA under an Emergency Use Authorization (EUA). This EUA will remain in effect (meaning this test can be used) for the duration of the COVID-19 declaration under Section 564(b)(1) of the Act, 21 U.S.C. section 360bbb-3(b)(1), unless the authorization is terminated or revoked.  Performed at [facility], [address] [place], [place] [address]   Admission on 02/13/2021, Discharged on 02/14/2021  Component Date Value Ref Range Status   SARS Coronavirus 2 by RT PCR 02/13/2021 NEGATIVE  NEGATIVE Final   Comment: (NOTE) SARS-CoV-2 target nucleic acids are NOT DETECTED.  The SARS-CoV-2 RNA is generally detectable in upper respiratory specimens during the acute phase of infection. The lowest concentration of SARS-CoV-2 viral copies this assay can detect is 138 copies/mL. A negative result does not preclude SARS-Cov-2 infection and should not be used as the sole basis for treatment or other patient management  decisions. A negative result may occur with  improper specimen collection/handling, submission of specimen other than nasopharyngeal swab, presence of viral mutation(s) within the areas targeted by this assay, and inadequate number of viral copies(<138 copies/mL). A negative result must be combined with clinical observations, patient history, and epidemiological information. The expected result is Negative.  Fact Sheet for Patients:  EntrepreneurPulse.com.au  Fact Sheet for Healthcare Providers:  IncredibleEmployment.be  This test is no                          t yet approved or cleared by the [place] FDA and  has been authorized for detection and/or diagnosis of SARS-CoV-2 by FDA under an Emergency Use Authorization (EUA). This EUA will remain  in effect (meaning this test can be used) for the duration of the COVID-19 declaration under Section 564(b)(1) of the Act, 21 U.S.C.section 360bbb-3(b)(1), unless the authorization is terminated  or revoked sooner.       Influenza A by PCR 02/13/2021 NEGATIVE  NEGATIVE Final   Influenza B by PCR 02/13/2021 NEGATIVE  NEGATIVE Final   Comment: (NOTE) The Xpert Xpress SARS-CoV-2/FLU/RSV plus assay is intended as an aid in the diagnosis of influenza from Nasopharyngeal swab specimens and should not be used as a sole basis for treatment. Nasal washings  and aspirates are unacceptable for Xpert Xpress SARS-CoV-2/FLU/RSV testing.  Fact Sheet for Patients: BloggerCourse.comhttps://www.fda.gov/media/152166/download  Fact Sheet for Healthcare Providers: SeriousBroker.ithttps://www.fda.gov/media/152162/download  This test is not yet approved or cleared by the [place] FDA and has been authorized for detection and/or diagnosis of SARS-CoV-2 by FDA under an Emergency Use Authorization (EUA). This EUA will remain in effect (meaning this test can be used) for the duration of the COVID-19 declaration under Section 564(b)(1) of the Act,  21 U.S.C. section 360bbb-3(b)(1), unless the authorization is terminated or revoked.  Performed at [facility], [address] [place], [place] [address]    Alcohol, Ethyl (B) 02/13/2021 <10  <10 mg/dL Final   Comment: (NOTE) Lowest detectable limit for serum alcohol is 10 mg/dL.  For medical purposes only. Performed at [facility], [address] [place], [place] [address]    POC Amphetamine UR 02/13/2021 Positive (A)  NONE DETECTED (Cut Off Level 1000 ng/mL) Final   POC Secobarbital (BAR) 02/13/2021 None Detected  NONE DETECTED (Cut Off Level 300 ng/mL) Final   POC Buprenorphine (BUP) 02/13/2021 None Detected  NONE DETECTED (Cut Off Level 10 ng/mL) Final   POC Oxazepam (BZO) 02/13/2021 None Detected  NONE DETECTED (Cut Off Level 300 ng/mL) Final   POC Cocaine UR 02/13/2021 None Detected  NONE DETECTED (Cut Off Level 300 ng/mL) Final   POC Methamphetamine UR 02/13/2021 Positive (A)  NONE DETECTED (Cut Off Level 1000 ng/mL) Final   POC Morphine 02/13/2021 None Detected  NONE DETECTED (Cut Off Level 300 ng/mL) Final   POC Oxycodone UR 02/13/2021 None Detected  NONE DETECTED (Cut Off Level 100 ng/mL) Final   POC Methadone UR 02/13/2021 None Detected  NONE DETECTED (Cut Off Level 300 ng/mL) Final   POC Marijuana UR 02/13/2021 None Detected  NONE DETECTED (Cut Off Level 50 ng/mL) Final   SARSCOV2ONAVIRUS 2 AG 02/13/2021 NEGATIVE  NEGATIVE Final   Comment: (NOTE) SARS-CoV-2 antigen NOT DETECTED.   Negative results are presumptive.  Negative results do not preclude SARS-CoV-2 infection and should not be used as the sole basis for treatment or other patient management decisions, including infection  control decisions, particularly in the presence of clinical signs and  symptoms consistent with COVID-19, or in those who have been in contact with the virus.  Negative results must be combined with clinical observations, patient history, and epidemiological information. The  expected result is Negative.  Fact Sheet for Patients: https://www.jennings-kim.com/https://www.fda.gov/media/141569/download  Fact Sheet for Healthcare Providers: https://alexander-rogers.biz/https://www.fda.gov/media/141568/download  This test is not yet approved or cleared by the [place] FDA and  has been authorized for detection and/or diagnosis of SARS-CoV-2 by FDA under an Emergency Use Authorization (EUA).  This EUA will remain in effect (meaning this test can be used) for the duration of  the COV                          ID-19 declaration under Section 564(b)(1) of the Act, 21 U.S.C. section 360bbb-3(b)(1), unless the authorization is terminated or revoked sooner.    Admission on 02/11/2021, Discharged on 02/12/2021  Component Date Value Ref Range Status   Sodium 02/11/2021 140  135 - 145 mmol/L Final   Potassium 02/11/2021 4.1  3.5 - 5.1 mmol/L Final   Chloride 02/11/2021 103  98 - 111 mmol/L Final   CO2 02/11/2021 24  22 - 32 mmol/L Final   Glucose, Bld 02/11/2021 89  70 - 99 mg/dL Final  Glucose reference range applies only to samples taken after fasting for at least 8 hours.   BUN 02/11/2021 12  6 - 20 mg/dL Final   Creatinine, Ser 02/11/2021 0.89  0.61 - 1.24 mg/dL Final   Calcium 02/11/2021 10.2  8.9 - 10.3 mg/dL Final   Total Protein 02/11/2021 8.4 (H)  6.5 - 8.1 g/dL Final   Albumin 02/11/2021 4.6  3.5 - 5.0 g/dL Final   AST 02/11/2021 40  15 - 41 U/L Final   ALT 02/11/2021 19  0 - 44 U/L Final   Alkaline Phosphatase 02/11/2021 83  38 - 126 U/L Final   Total Bilirubin 02/11/2021 1.7 (H)  0.3 - 1.2 mg/dL Final   GFR, Estimated 02/11/2021 >60  >60 mL/min Final   Comment: (NOTE) Calculated using the CKD-EPI Creatinine Equation (2021)    Anion gap 02/11/2021 13  5 - 15 Final   Performed at [place] [address], [place], [place] [address]   Alcohol, Ethyl (B) 02/11/2021 <10  <10 mg/dL Final   Comment: (NOTE) Lowest detectable limit for serum alcohol is 10 mg/dL.  For medical purposes  only. Performed at [facility], [place] [address] [place], [place] [id number]    Salicylate Lvl [id number] <7.0 (L)  7.0 - 30.0 mg/dL Final   Performed at [place] [address] [place] [address], [place], [place] [address]   Acetaminophen (Tylenol), Serum 02/11/2021 <10 (L)  10 - 30 ug/mL Final   Comment: (NOTE) Therapeutic concentrations vary significantly. A range of 10-30 ug/mL  may be an effective concentration for many patients. However, some  are best treated at concentrations outside of this range. Acetaminophen concentrations >150 ug/mL at 4 hours after ingestion  and >50 ug/mL at 12 hours after ingestion are often associated with  toxic reactions.  Performed at [facility], [place] [address] [place], [place] [address]    WBC 02/11/2021 11.6 (H)  4.0 - 10.5 K/uL Final   RBC 02/11/2021 5.47  4.22 - 5.81 MIL/uL Final   Hemoglobin 02/11/2021 15.5  13.0 - 17.0 g/dL Final   HCT 02/11/2021 46.5  39.0 - 52.0 % Final   MCV 02/11/2021 85.0  80.0 - 100.0 fL Final   MCH 02/11/2021 28.3  26.0 - 34.0 pg Final   MCHC 02/11/2021 33.3  30.0 - 36.0 g/dL Final   RDW 02/11/2021 13.8  11.5 - 15.5 % Final   Platelets 02/11/2021 337  150 - 400 K/uL Final   nRBC 02/11/2021 0.0  0.0 - 0.2 % Final   Performed at [facility] [address], [place], [place] [address]   Opiates 02/11/2021 NONE DETECTED  NONE DETECTED Final   Cocaine 02/11/2021 NONE DETECTED  NONE DETECTED Final   Benzodiazepines 02/11/2021 NONE DETECTED  NONE DETECTED Final   Amphetamines 02/11/2021 POSITIVE (A)  NONE DETECTED Final   Tetrahydrocannabinol 02/11/2021 NONE DETECTED  NONE DETECTED Final   Barbiturates 02/11/2021 NONE DETECTED  NONE DETECTED Final   Comment: (NOTE) DRUG SCREEN FOR MEDICAL PURPOSES ONLY.  IF CONFIRMATION IS NEEDED FOR ANY PURPOSE, NOTIFY LAB WITHIN 5 DAYS.  LOWEST DETECTABLE LIMITS FOR URINE DRUG SCREEN Drug Class                     Cutoff (ng/mL) Amphetamine and metabolites     1000 Barbiturate and metabolites    200 Benzodiazepine  [id number] Tricyclics and metabolites     300 Opiates and metabolites        300 Cocaine and metabolites        300 THC                            50 Performed at [facility], [place] [address] [place], [place] [address]    SARS Coronavirus 2 by RT PCR 02/11/2021 NEGATIVE  NEGATIVE Final   Comment: (NOTE) SARS-CoV-2 target nucleic acids are NOT DETECTED.  The SARS-CoV-2 RNA is generally detectable in upper respiratory specimens during the acute phase of infection. The lowest concentration of SARS-CoV-2 viral copies this assay can detect is 138 copies/mL. A negative result does not preclude SARS-Cov-2 infection and should not be used as the sole basis for treatment or other patient management decisions. A negative result may occur with  improper specimen collection/handling, submission of specimen other than nasopharyngeal swab, presence of viral mutation(s) within the areas targeted by this assay, and inadequate number of viral copies(<138 copies/mL). A negative result must be combined with clinical observations, patient history, and epidemiological information. The expected result is Negative.  Fact Sheet for Patients:  EntrepreneurPulse.com.au  Fact Sheet for Healthcare Providers:  IncredibleEmployment.be  This test is no                          t yet approved or cleared by the [place] FDA and  has been authorized for detection and/or diagnosis of SARS-CoV-2 by FDA under an Emergency Use Authorization (EUA). This EUA will remain  in effect (meaning this test can be used) for the duration of the COVID-19 declaration under Section 564(b)(1) of the Act, 21 U.S.C.section 360bbb-3(b)(1), unless the authorization is terminated  or revoked sooner.       Influenza A by PCR 02/11/2021 NEGATIVE  NEGATIVE Final   Influenza B by PCR 02/11/2021 NEGATIVE  NEGATIVE Final    Comment: (NOTE) The Xpert Xpress SARS-CoV-2/FLU/RSV plus assay is intended as an aid in the diagnosis of influenza from Nasopharyngeal swab specimens and should not be used as a sole basis for treatment. Nasal washings and aspirates are unacceptable for Xpert Xpress SARS-CoV-2/FLU/RSV testing.  Fact Sheet for Patients: EntrepreneurPulse.com.au  Fact Sheet for Healthcare Providers: IncredibleEmployment.be  This test is not yet approved or cleared by the [place] FDA and has been authorized for detection and/or diagnosis of SARS-CoV-2 by FDA under an Emergency Use Authorization (EUA). This EUA will remain in effect (meaning this test can be used) for the duration of the COVID-19 declaration under Section 564(b)(1) of the Act, 21 U.S.C. section 360bbb-3(b)(1), unless the authorization is terminated or revoked.  Performed at [facility], [place] [address], [place], [place] [address]   Admission on 02/05/2021, Discharged on 02/05/2021  Component Date Value Ref Range Status   SARS Coronavirus 2 by RT PCR 02/05/2021 NEGATIVE  NEGATIVE Final   Comment: (NOTE) SARS-CoV-2 target nucleic acids are NOT DETECTED.  The SARS-CoV-2 RNA is generally detectable in upper respiratory specimens during the acute phase of infection. The lowest concentration of SARS-CoV-2 viral copies this assay can detect is 138 copies/mL. A negative result does not preclude SARS-Cov-2 infection and should not be used as the sole basis for treatment or other patient management decisions. A negative result may occur with  improper specimen collection/handling, submission of specimen other than nasopharyngeal swab, presence of  viral mutation(s) within the areas targeted by this assay, and inadequate number of viral copies(<138 copies/mL). A negative result must be combined with clinical observations, patient history, and epidemiological information. The expected result is  Negative.  Fact Sheet for Patients:  BloggerCourse.com  Fact Sheet for Healthcare Providers:  SeriousBroker.it  This test is no                          t yet approved or cleared by the [place] FDA and  has been authorized for detection and/or diagnosis of SARS-CoV-2 by FDA under an Emergency Use Authorization (EUA). This EUA will remain  in effect (meaning this test can be used) for the duration of the COVID-19 declaration under Section 564(b)(1) of the Act, 21 U.S.C.section 360bbb-3(b)(1), unless the authorization is terminated  or revoked sooner.       Influenza A by PCR 02/05/2021 NEGATIVE  NEGATIVE Final   Influenza B by PCR 02/05/2021 NEGATIVE  NEGATIVE Final   Comment: (NOTE) The Xpert Xpress SARS-CoV-2/FLU/RSV plus assay is intended as an aid in the diagnosis of influenza from Nasopharyngeal swab specimens and should not be used as a sole basis for treatment. Nasal washings and aspirates are unacceptable for Xpert Xpress SARS-CoV-2/FLU/RSV testing.  Fact Sheet for Patients: BloggerCourse.com  Fact Sheet for Healthcare Providers: SeriousBroker.it  This test is not yet approved or cleared by the [place] FDA and has been authorized for detection and/or diagnosis of SARS-CoV-2 by FDA under an Emergency Use Authorization (EUA). This EUA will remain in effect (meaning this test can be used) for the duration of the COVID-19 declaration under Section 564(b)(1) of the Act, 21 U.S.C. section 360bbb-3(b)(1), unless the authorization is terminated or revoked.  Performed at [facility], [address], [place], [place] [address]    SARS Coronavirus 2 Ag 02/05/2021 Negative  Negative Preliminary   WBC 02/05/2021 9.2  4.0 - 10.5 K/uL Final   RBC 02/05/2021 5.05  4.22 - 5.81 MIL/uL Final   Hemoglobin 02/05/2021 14.4  13.0 - 17.0 g/dL Final   HCT 67/54/4920 43.1   39.0 - 52.0 % Final   MCV 02/05/2021 85.3  80.0 - 100.0 fL Final   MCH 02/05/2021 28.5  26.0 - 34.0 pg Final   MCHC 02/05/2021 33.4  30.0 - 36.0 g/dL Final   RDW 01/31/1218 14.0  11.5 - 15.5 % Final   Platelets 02/05/2021 346  150 - 400 K/uL Final   nRBC 02/05/2021 0.0  0.0 - 0.2 % Final   Neutrophils Relative % 02/05/2021 59  % Final   Neutro Abs 02/05/2021 5.3  1.7 - 7.7 K/uL Final   Lymphocytes Relative 02/05/2021 29  % Final   Lymphs Abs 02/05/2021 2.7  0.7 - 4.0 K/uL Final   Monocytes Relative 02/05/2021 7  % Final   Monocytes Absolute 02/05/2021 0.7  0.1 - 1.0 K/uL Final   Eosinophils Relative 02/05/2021 5  % Final   Eosinophils Absolute 02/05/2021 0.5  0.0 - 0.5 K/uL Final   Basophils Relative 02/05/2021 0  % Final   Basophils Absolute 02/05/2021 0.0  0.0 - 0.1 K/uL Final   Immature Granulocytes 02/05/2021 0  % Final   Abs Immature Granulocytes 02/05/2021 0.03  0.00 - 0.07 K/uL Final   Performed at [facility], [address], [place], [place] [address]   Sodium 02/05/2021 138  135 - 145 mmol/L Final   Potassium 02/05/2021 4.0  3.5 -  5.1 mmol/L Final   Chloride 02/05/2021 102  98 - 111 mmol/L Final   CO2 02/05/2021 25  22 - 32 mmol/L Final   Glucose, Bld 02/05/2021 88  70 - 99 mg/dL Final   Glucose reference range applies only to samples taken after fasting for at least 8 hours.   BUN 02/05/2021 15  6 - 20 mg/dL Final   Creatinine, Ser 02/05/2021 0.76  0.61 - 1.24 mg/dL Final   Calcium 02/05/2021 9.8  8.9 - 10.3 mg/dL Final   Total Protein 02/05/2021 7.5  6.5 - 8.1 g/dL Final   Albumin 02/05/2021 4.3  3.5 - 5.0 g/dL Final   AST 02/05/2021 21  15 - 41 U/L Final   ALT 02/05/2021 16  0 - 44 U/L Final   Alkaline Phosphatase 02/05/2021 76  38 - 126 U/L Final   Total Bilirubin 02/05/2021 1.0  0.3 - 1.2 mg/dL Final   GFR, Estimated 02/05/2021 >60  >60 mL/min Final   Comment: (NOTE) Calculated using the CKD-EPI Creatinine Equation (2021)    Anion gap 02/05/2021 11  5 -  15 Final   Performed at [facility] [address] [place], [place] [address]   Alcohol, Ethyl (B) 02/05/2021 <10  <10 mg/dL Final   Comment: (NOTE) Lowest detectable limit for serum alcohol is 10 mg/dL.  For medical purposes only. Performed at [facility], [place] [address] [place], [place] [address]    POC Amphetamine UR 02/05/2021 None Detected  NONE DETECTED (Cut Off Level 1000 ng/mL) Preliminary   POC Secobarbital (BAR) 02/05/2021 None Detected  NONE DETECTED (Cut Off Level 300 ng/mL) Preliminary   POC Buprenorphine (BUP) 02/05/2021 None Detected  NONE DETECTED (Cut Off Level 10 ng/mL) Preliminary   POC Oxazepam (BZO) 02/05/2021 None Detected  NONE DETECTED (Cut Off Level 300 ng/mL) Preliminary   POC Cocaine UR 02/05/2021 None Detected  NONE DETECTED (Cut Off Level 300 ng/mL) Preliminary   POC Methamphetamine UR 02/05/2021 None Detected  NONE DETECTED (Cut Off Level 1000 ng/mL) Preliminary   POC Morphine 02/05/2021 None Detected  NONE DETECTED (Cut Off Level 300 ng/mL) Preliminary   POC Oxycodone UR 02/05/2021 None Detected  NONE DETECTED (Cut Off Level 100 ng/mL) Preliminary   POC Methadone UR 02/05/2021 None Detected  NONE DETECTED (Cut Off Level 300 ng/mL) Preliminary   POC Marijuana UR 02/05/2021 None Detected  NONE DETECTED (Cut Off Level 50 ng/mL) Preliminary   TSH 02/05/2021 5.750 (H)  0.350 - 4.500 uIU/mL Final   Comment: Performed by a 3rd Generation assay with a functional sensitivity of <=0.01 uIU/mL. Performed at [facility], [place] [address], [place],  [address]    SARSCOV2ONAVIRUS 2 AG 02/05/2021 NEGATIVE  NEGATIVE Final   Comment: (NOTE) SARS-CoV-2 antigen NOT DETECTED.   Negative results are presumptive.  Negative results do not preclude SARS-CoV-2 infection and should not be used as the sole basis for treatment or other patient management decisions, including infection  control decisions, particularly in the presence of clinical signs and   symptoms consistent with COVID-19, or in those who have been in contact with the virus.  Negative results must be combined with clinical observations, patient history, and epidemiological information. The expected result is Negative.  Fact Sheet for Patients: HandmadeRecipes.com.cy  Fact Sheet for Healthcare Providers: FuneralLife.at  This test is not yet approved or cleared by the [place] FDA and  has been authorized for detection and/or diagnosis of SARS-CoV-2 by FDA under an Emergency  Use Authorization (EUA).  This EUA will remain in effect (meaning this test can be used) for the duration of  the COV                          ID-19 declaration under Section 564(b)(1) of the Act, 21 U.S.C. section 360bbb-3(b)(1), unless the authorization is terminated or revoked sooner.     Free T4 02/05/2021 0.80  0.61 - 1.12 ng/dL Final   Comment: (NOTE) Biotin ingestion may interfere with free T4 tests. If the results are inconsistent with the TSH level, previous test results, or the clinical presentation, then consider biotin interference. If needed, order repeat testing after stopping biotin. Performed at [facility], [place] [address] [place], [place] [address]    T3, Free 02/05/2021 3.8  2.0 - 4.4 pg/mL Final   Comment: (NOTE) Performed At: [facility] [place], [place] [id number] Rush Farmer MD [id number]   Admission on 02/04/2021, Discharged on 02/04/2021  Component Date Value Ref Range Status   SARS Coronavirus 2 by RT PCR 02/04/2021 NEGATIVE  NEGATIVE Final   Comment: (NOTE) SARS-CoV-2 target nucleic acids are NOT DETECTED.  The SARS-CoV-2 RNA is generally detectable in upper respiratory specimens during the acute phase of infection. The lowest concentration of SARS-CoV-2 viral copies this assay can detect is 138 copies/mL. A negative result does not preclude SARS-Cov-2 infection and should  not be used as the sole basis for treatment or other patient management decisions. A negative result may occur with  improper specimen collection/handling, submission of specimen other than nasopharyngeal swab, presence of viral mutation(s) within the areas targeted by this assay, and inadequate number of viral copies(<138 copies/mL). A negative result must be combined with clinical observations, patient history, and epidemiological information. The expected result is Negative.  Fact Sheet for Patients:  EntrepreneurPulse.com.au  Fact Sheet for Healthcare Providers:  IncredibleEmployment.be  This test is no                          t yet approved or cleared by the [place] FDA and  has been authorized for detection and/or diagnosis of SARS-CoV-2 by FDA under an Emergency Use Authorization (EUA). This EUA will remain  in effect (meaning this test can be used) for the duration of the COVID-19 declaration under Section 564(b)(1) of the Act, 21 U.S.C.section 360bbb-3(b)(1), unless the authorization is terminated  or revoked sooner.       Influenza A by PCR 02/04/2021 NEGATIVE  NEGATIVE Final   Influenza B by PCR 02/04/2021 NEGATIVE  NEGATIVE Final   Comment: (NOTE) The Xpert Xpress SARS-CoV-2/FLU/RSV plus assay is intended as an aid in the diagnosis of influenza from Nasopharyngeal swab specimens and should not be used as a sole basis for treatment. Nasal washings and aspirates are unacceptable for Xpert Xpress SARS-CoV-2/FLU/RSV testing.  Fact Sheet for Patients: EntrepreneurPulse.com.au  Fact Sheet for Healthcare Providers: IncredibleEmployment.be  This test is not yet approved or cleared by the [place] FDA and has been authorized for detection and/or diagnosis of SARS-CoV-2 by FDA under an Emergency Use Authorization (EUA). This EUA will remain in effect (meaning this test can be used) for the  duration of the COVID-19 declaration under Section 564(b)(1) of the Act, 21 U.S.C. section 360bbb-3(b)(1), unless the authorization is terminated or revoked.  Performed at [facility], [place] [address], [place], [place] [address]   Admission on  01/16/2021, Discharged on 01/24/2021  Component Date Value Ref Range Status   Cholesterol 01/17/2021 94  0 - 200 mg/dL Final   Triglycerides 01/17/2021 100  <150 mg/dL Final   HDL 01/17/2021 28 (L)  >40 mg/dL Final   Total CHOL/HDL Ratio 01/17/2021 3.4  RATIO Final   VLDL 01/17/2021 20  0 - 40 mg/dL Final   LDL Cholesterol 01/17/2021 46  0 - 99 mg/dL Final   Comment:        Total Cholesterol/HDL:CHD Risk Coronary Heart Disease Risk Table                     Men   Women  1/2 Average Risk   3.4   3.3  Average Risk       5.0   4.4  2 X Average Risk   9.6   7.1  3 X Average Risk  23.4   11.0        Use the calculated Patient Ratio above and the CHD Risk Table to determine the patient's CHD Risk.        ATP III CLASSIFICATION (LDL):  <100     mg/dL   Optimal  100-129  mg/dL   Near or Above                    Optimal  130-159  mg/dL   Borderline  160-189  mg/dL   High  >190     mg/dL   Very High Performed at [place], [place] [address]    Hgb A1c MFr Bld 01/17/2021 6.0 (H)  4.8 - 5.6 % Final   Comment: (NOTE)         Prediabetes: 5.7 - 6.4         Diabetes: >6.4         Glycemic control for adults with diabetes: <7.0    Mean Plasma Glucose 01/17/2021 126  mg/dL Final   Comment: (NOTE) Performed At: [facility] [place], [place] [id number] Rush Farmer MD [id number]   Admission on 01/16/2021, Discharged on 01/16/2021  Component Date Value Ref Range Status   Sodium 01/16/2021 134 (L)  135 - 145 mmol/L Final   Potassium 01/16/2021 3.8  3.5 - 5.1 mmol/L Final   Chloride 01/16/2021 96 (L)  98 - 111 mmol/L Final   CO2 01/16/2021 27  22 - 32 mmol/L Final    Glucose, Bld 01/16/2021 79  70 - 99 mg/dL Final   Glucose reference range applies only to samples taken after fasting for at least 8 hours.   BUN 01/16/2021 8  6 - 20 mg/dL Final   Creatinine, Ser 01/16/2021 0.83  0.61 - 1.24 mg/dL Final   Calcium 01/16/2021 9.3  8.9 - 10.3 mg/dL Final   Total Protein 01/16/2021 7.6  6.5 - 8.1 g/dL Final   Albumin 01/16/2021 4.0  3.5 - 5.0 g/dL Final   AST 01/16/2021 22  15 - 41 U/L Final   ALT 01/16/2021 18  0 - 44 U/L Final   Alkaline Phosphatase 01/16/2021 58  38 - 126 U/L Final   Total Bilirubin 01/16/2021 0.8  0.3 - 1.2 mg/dL Final   GFR, Estimated 01/16/2021 >60  >60 mL/min Final   Comment: (NOTE) Calculated using the CKD-EPI Creatinine Equation (2021)    Anion gap 01/16/2021 11  5 - 15 Final   Performed at [facility], [address], [place], [place] [address]  Alcohol, Ethyl (B) 01/16/2021 <10  <10 mg/dL Final   Comment: (NOTE) Lowest detectable limit for serum alcohol is 10 mg/dL.  For medical purposes only. Performed at [facility], [address] [place], [place] [id number]    Salicylate Lvl [id number] <7.0 (L)  7.0 - 30.0 mg/dL Final   Performed at [facility], [place], [place] [address]   Acetaminophen (Tylenol), Serum 01/16/2021 <10 (L)  10 - 30 ug/mL Final   Comment: (NOTE) Therapeutic concentrations vary significantly. A range of 10-30 ug/mL  may be an effective concentration for many patients. However, some  are best treated at concentrations outside of this range. Acetaminophen concentrations >150 ug/mL at 4 hours after ingestion  and >50 ug/mL at 12 hours after ingestion are often associated with  toxic reactions.  Performed at [facility], [address] [place], [place] [address]    WBC 01/16/2021 8.8  4.0 - 10.5 K/uL Final   RBC 01/16/2021 4.89  4.22 - 5.81 MIL/uL Final   Hemoglobin 01/16/2021 14.4  13.0 - 17.0 g/dL Final   HCT 01/16/2021 41.7  39.0 - 52.0 %  Final   MCV 01/16/2021 85.3  80.0 - 100.0 fL Final   MCH 01/16/2021 29.4  26.0 - 34.0 pg Final   MCHC 01/16/2021 34.5  30.0 - 36.0 g/dL Final   RDW 01/16/2021 13.5  11.5 - 15.5 % Final   Platelets 01/16/2021 402 (H)  150 - 400 K/uL Final   nRBC 01/16/2021 0.0  0.0 - 0.2 % Final   Performed at [facility], [address] [place],  [id number]   Tricyclic, Ur Screen [id number] NONE DETECTED  NONE DETECTED Final   Amphetamines, Ur Screen 01/16/2021 POSITIVE (A)  NONE DETECTED Final   MDMA (Ecstasy)Ur Screen 01/16/2021 NONE DETECTED  NONE DETECTED Final   Cocaine Metabolite,Ur [place] 01/16/2021 NONE DETECTED  NONE DETECTED Final   Opiate, Ur Screen 01/16/2021 NONE DETECTED  NONE DETECTED Final   Phencyclidine (PCP) Ur S 01/16/2021 NONE DETECTED  NONE DETECTED Final   Cannabinoid 50 Ng, Ur [facility] 01/16/2021 NONE DETECTED  NONE DETECTED Final   Barbiturates, Ur Screen 01/16/2021 NONE DETECTED  NONE DETECTED Final   Benzodiazepine, Ur Scrn 01/16/2021 NONE DETECTED  NONE DETECTED Final   Methadone Scn, Ur 01/16/2021 NONE DETECTED  NONE DETECTED Final   Comment: (NOTE) Tricyclics + metabolites, urine    Cutoff 1000 ng/mL Amphetamines + metabolites, urine  Cutoff 1000 ng/mL MDMA (Ecstasy), urine              Cutoff 500 ng/mL Cocaine Metabolite, urine          Cutoff 300 ng/mL Opiate + metabolites, urine        Cutoff 300 ng/mL Phencyclidine (PCP), urine         Cutoff 25 ng/mL Cannabinoid, urine                 Cutoff 50 ng/mL Barbiturates + metabolites, urine  Cutoff 200 ng/mL Benzodiazepine, urine              Cutoff 200 ng/mL Methadone, urine                   Cutoff 300 ng/mL  The urine drug screen provides only a preliminary, unconfirmed analytical test result and should not be used for non-medical purposes. Clinical consideration and professional judgment should be applied to any positive drug screen result due to possible interfering substances. A more specific  alternate  chemical method must be used in order to obtain a confirmed analytical result. Gas chromatography / mass spectrometry (GC/MS) is the preferred confirm                          atory method. Performed at [facility], [address] [place], [place] [address]    SARS Coronavirus 2 by RT PCR 01/16/2021 NEGATIVE  NEGATIVE Final   Comment: (NOTE) SARS-CoV-2 target nucleic acids are NOT DETECTED.  The SARS-CoV-2 RNA is generally detectable in upper respiratory specimens during the acute phase of infection. The lowest concentration of SARS-CoV-2 viral copies this assay can detect is 138 copies/mL. A negative result does not preclude SARS-Cov-2 infection and should not be used as the sole basis for treatment or other patient management decisions. A negative result may occur with  improper specimen collection/handling, submission of specimen other than nasopharyngeal swab, presence of viral mutation(s) within the areas targeted by this assay, and inadequate number of viral copies(<138 copies/mL). A negative result must be combined with clinical observations, patient history, and epidemiological information. The expected result is Negative.  Fact Sheet for Patients:  EntrepreneurPulse.com.au  Fact Sheet for Healthcare Providers:  IncredibleEmployment.be  This test is no                          t yet approved or cleared by the [place] FDA and  has been authorized for detection and/or diagnosis of SARS-CoV-2 by FDA under an Emergency Use Authorization (EUA). This EUA will remain  in effect (meaning this test can be used) for the duration of the COVID-19 declaration under Section 564(b)(1) of the Act, 21 U.S.C.section 360bbb-3(b)(1), unless the authorization is terminated  or revoked sooner.       Influenza A by PCR 01/16/2021 NEGATIVE  NEGATIVE Final   Influenza B by PCR 01/16/2021 NEGATIVE  NEGATIVE Final   Comment: (NOTE) The  Xpert Xpress SARS-CoV-2/FLU/RSV plus assay is intended as an aid in the diagnosis of influenza from Nasopharyngeal swab specimens and should not be used as a sole basis for treatment. Nasal washings and aspirates are unacceptable for Xpert Xpress SARS-CoV-2/FLU/RSV testing.  Fact Sheet for Patients: EntrepreneurPulse.com.au  Fact Sheet for Healthcare Providers: IncredibleEmployment.be  This test is not yet approved or cleared by the [place] FDA and has been authorized for detection and/or diagnosis of SARS-CoV-2 by FDA under an Emergency Use Authorization (EUA). This EUA will remain in effect (meaning this test can be used) for the duration of the COVID-19 declaration under Section 564(b)(1) of the Act, 21 U.S.C. section 360bbb-3(b)(1), unless the authorization is terminated or revoked.  Performed at [facility], [address] [place], [place] [address]   There may be more visits with results that are not included.    Blood Alcohol level:  Lab Results  Component Value Date   ETH <10 04/14/2021   ETH <10 0000000    Metabolic Disorder Labs: Lab Results  Component Value Date   HGBA1C 6.0 (H) 01/17/2021   MPG 126 01/17/2021   MPG 126 10/08/2020   No results found for: PROLACTIN Lab Results  Component Value Date   CHOL 94 01/17/2021   TRIG 100 01/17/2021   HDL 28 (L) 01/17/2021   CHOLHDL 3.4 01/17/2021   VLDL 20 01/17/2021   LDLCALC 46 01/17/2021   LDLCALC 49 10/08/2020    Therapeutic Lab Levels: No results found for:  LITHIUM Lab Results  Component Value Date   VALPROATE 62 10/16/2019   No components found for:  CBMZ  Physical Findings   AIMS    Flowsheet Row Admission (Discharged) from 01/16/2021 in [place] Admission (Discharged) from 10/07/2020 in [place] 300B Admission (Discharged) from 09/10/2020 in [place]  300B  AIMS Total Score 0 0 0      AUDIT    Flowsheet Row Admission (Discharged) from 01/16/2021 in [place] Admission (Discharged) from 10/07/2020 in [place] 300B Admission (Discharged) from 09/10/2020 in [place] 300B Admission (Discharged) from 10/10/2019 in [place] Admission (Discharged) from 09/14/2019 in [place]  Alcohol Use Disorder Identification Test Final Score (AUDIT) 1 34 15 0 0      PHQ2-9    [place] ED from 04/14/2021 in [place] ED from 02/05/2021 in [facility] ED from 01/14/2021 in [place] [facility] from 11/10/2020 in [place] ED from 10/28/2020 in [place]  PHQ-2 Total Score 2 4 2 4 3   PHQ-9 Total Score 16 9 6 8 10       [place] [facility] from 04/15/2021 in [facility] from 04/14/2021 in [facility] [facility] from 04/05/2021 in [facility]  C-SSRS RISK CATEGORY Low Risk High Risk No Risk        Musculoskeletal  Strength & Muscle Tone: within normal limits Gait & Station: normal Patient leans: N/A  Psychiatric Specialty Exam  Presentation  General Appearance: Appropriate for Environment  Eye Contact:Good; Fair  Speech:Clear and Coherent; Normal Rate  Speech Volume:Normal  Handedness:Right   Mood and Affect  Mood:Depressed  Affect:Congruent   Thought Process  Thought Processes:Coherent; Goal Directed  Descriptions of Associations:Intact  Orientation:Full (Time, Place and Person)  Thought Content:Logical; WDL  Diagnosis of Schizophrenia or Schizoaffective disorder in past: No    Hallucinations:Hallucinations: None  Ideas of Reference:None  Suicidal Thoughts:Suicidal Thoughts: Yes, Passive (Patient states that his  suicidal thoughts are more like no one would miss him if he was dead; or don't care if he wakes up.  Denies active suicidal thinking at this time) SI Active Intent and/or Plan: Without Intent; Without Plan SI Passive Intent and/or Plan: Without Intent; Without Plan  Homicidal Thoughts:Homicidal Thoughts: No   Sensorium  Memory:Immediate Good; Recent Good; Remote Good  Judgment:Intact  Insight:Fair; Present   Executive Functions  Concentration:Good  Attention Span:Good  [place] of Knowledge:Good  Language:Good   Psychomotor Activity  Psychomotor Activity:Psychomotor Activity: Normal   Assets  Assets:Communication Skills; Desire for Improvement; Leisure Time; Social Support   Sleep  Sleep:Sleep: Good Number of Hours of Sleep: 2   Nutritional Assessment (For OBS and FBC admissions only) Has the patient had a weight loss or gain of 10 pounds or more in the last 3 months?: No Has the patient had a decrease in food intake/or appetite?: No Does the patient have dental problems?: No Does the patient have eating habits or behaviors that may be indicators of an eating disorder including binging or inducing vomiting?: No Has the patient recently lost weight without trying?: 0 Has the patient been eating poorly because of a decreased appetite?: 0 Malnutrition Screening Tool Score: 0 Nutritional Assessment Referrals: Refer to Primary Care Provider    Physical Exam  Physical Exam Vitals reviewed.  Constitutional:      General: He is not in acute distress.    Appearance: He is not ill-appearing, toxic-appearing or diaphoretic.  HENT:     Head: Normocephalic and atraumatic.     Right Ear: External ear normal.     Left Ear: External ear normal.     Nose: Nose normal.  Eyes:     General:        Right eye: No discharge.        Left eye: No discharge.     Conjunctiva/sclera: Conjunctivae normal.  Cardiovascular:     Rate and Rhythm: Normal rate.  Pulmonary:      Effort: Pulmonary effort is normal. No respiratory distress.  Musculoskeletal:        General: Normal range of motion.     Cervical back: Normal range of motion.  Skin:    Comments: Multiple hypopigmented scarlike lesions noted on patient's left forearm with no signs of active bleeding, drainage, inflammation, or infection noted.  Neurological:     General: No focal deficit present.     Mental Status: He is alert and oriented to person, place, and time.     Comments: No tremor noted.   Psychiatric:        Attention and Perception: Attention and perception normal. He does not perceive auditory or visual hallucinations.        Speech: Speech normal.        Behavior: Behavior normal. Behavior is not agitated, slowed, aggressive, hyperactive or combative. Behavior is cooperative.        Thought Content: Thought content is not paranoid or delusional. Thought content includes suicidal ideation. Thought content does not include homicidal ideation. Thought content does not include suicidal plan.     Comments: Mood is depressed and hopeless with congruent affect.    Review of Systems  Constitutional:  Positive for malaise/fatigue. Negative for chills, diaphoresis and fever.       Patient unsure about recent weight changes.  HENT:  Positive for congestion.        + for rhinorrhea  Respiratory:  Positive for cough. Negative for shortness of breath.   Cardiovascular:  Negative for chest pain and palpitations.  Gastrointestinal:  Negative for abdominal pain, constipation, diarrhea, nausea and vomiting.  Musculoskeletal:  Negative for joint pain and myalgias.  Neurological:  Positive for headaches. Negative for dizziness.  Psychiatric/Behavioral:  Positive for depression, substance abuse and suicidal ideas. Negative for hallucinations and memory loss. The patient has insomnia. The patient is not nervous/anxious.   All other systems reviewed and are negative. Blood pressure 109/63, pulse 80,  temperature 97.8 F (36.6 C), temperature source Oral, resp. rate 16, SpO2 97 %. There is no height or weight on file to calculate BMI.  Treatment Plan Summary: Daily contact with patient to assess and evaluate symptoms and progress in treatment, Medication management, and Plan Admit to Facility Based Crisis Unit [facility]  Labs Reviewed:  See above  Meds ordered this encounter  Medications   acetaminophen (TYLENOL) tablet 650 mg   alum & mag hydroxide-simeth (MAALOX/MYLANTA) 200-200-20 MG/5ML suspension 30 mL   magnesium hydroxide (MILK OF MAGNESIA) suspension 30 mL   sertraline (ZOLOFT) tablet 50 mg   albuterol (VENTOLIN HFA) 108 (90 Base) MCG/ACT inhaler 2 puff   hydrOXYzine (ATARAX) tablet 25 mg   traZODone (DESYREL) tablet 50 mg   sodium chloride (OCEAN) 0.65 % nasal spray 1 spray     Donesha Wallander, NP 04/16/2021 12:08 PM

## 2021-04-16 NOTE — Clinical Social Work Psych Note (Addendum)
CSW Initial Note  CSW met with Nathan Koch for introduction and to begin discussions regarding treatment and potential discharge planning. Nathan Koch was a direct admit to the [facility] from [facility] seeking assistance for worsening depression, suicidal ideation and substance abuse issues.   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 He reports his usage has been on and off, however lately he has been using daily. Nathan Koch reports using up to 1 gram of methamphetamines on a daily basis.   Nathan Koch reports that he has been homeless for the last week, and prior to that he was living at an [facility], however he left after he relapsed on meth. Nathan Koch reports his wife and 2yo daughter live in [place]. He reports having a strained relationship with his wife at this time due to multiple factors and did not identify her as a support or her home as an alternative living arrangement at discharge.    Nathan Koch shared that he is interested in participating in a residential treatment program.  CSW informed Nathan Koch, that the [facility] CSW Nathan Koch)  initiated a bed search and found the following information:   [facility] No bed availability until after 05/14/21   [facility] Pt has to do a phone screening and they would need a mental health assessment faxed to them [phone])   [facility] No bed availability until January   [facility] Left HIPPA compliant voicemail   [facility] Do not allow any mental health medications as they are not dual diagnosis   [facility] Have a wait list currently but would need to do a pre-screen to be put on the list.  Nathan Koch expressed that he wanted to participate in a program in the area and wanted [facility] to be his last option. CSW ensured Nathan Koch that all options will be explored and once bed availability is known, CSW will update him. Nathan Koch expressed understanding.    CSW will continue to follow for possible placement.     Radonna Ricker, MSW, LCSW Clinical Education officer, museum ([place]) [facility]

## 2021-04-16 NOTE — Discharge Instructions (Addendum)
Take all medications as prescribed. Keep all follow-up appointments as scheduled.  Do not consume alcohol or use illegal drugs while on prescription medications. Report any adverse effects from your medications to your primary care provider promptly.  In the event of recurrent symptoms or worsening symptoms, call 911, a crisis hotline, or go to the nearest emergency department for evaluation.   

## 2021-04-16 NOTE — Progress Notes (Signed)
Patient sleeping.  Respirations even and unlabored. Continue to monitor for safety. °

## 2021-04-16 NOTE — ED Notes (Signed)
Pt awake & resting at present.  No distress noted.  Monitoring for safety. 

## 2021-04-16 NOTE — ED Notes (Signed)
Pt participating in AA group.

## 2021-04-16 NOTE — Progress Notes (Signed)
Pt is asleep. Respirations are even and unlabored. No signs of acute distress noted. Monitoring for pt's safety. 

## 2021-04-16 NOTE — ED Notes (Addendum)
Pt given lunch

## 2021-04-16 NOTE — Progress Notes (Signed)
CSW contacted the following substance use treatment facilities with these results:  [facility] No bed availability until after 05/14/21  [facility] Pt has to do a phone screening and they would need a mental health assessment faxed to them [phone])  [facility] No bed availability until January  [facility] Left HIPPA compliant voicemail  [facility] Do not allow any mental health medications as they are not dual diagnosis  [facility] Have a wait list currently but would need to do a pre-screen to be put on the list.  CSW spoke with pt briefly about needing to call [facility] for phone screening. He agreed and was given the information to contact them. No other questions/concerns expressed. Contact ended without incident.   CSW will follow up regarding screening.   Vilma Meckel. Algis Greenhouse, MSW, LCSW, LCAS 04/16/2021 11:28 AM

## 2021-04-16 NOTE — ED Notes (Signed)
Pt sitting in dayroom calm and cooperative. No c/o pain or distress. A & Ox 4. Will continue to monitor for safety

## 2021-04-16 NOTE — Progress Notes (Signed)
Patient resting in bed.  Endorsed SI without a plan. Denied HI, AVH.  Stated that he thinks he could benefit from inpatient treatment someplace.  Ate breakfast.  Continue to monitor for safety.

## 2021-04-16 NOTE — ED Notes (Signed)
Pt eating dinner

## 2021-04-17 DIAGNOSIS — F17210 Nicotine dependence, cigarettes, uncomplicated: Secondary | ICD-10-CM | POA: Diagnosis not present

## 2021-04-17 DIAGNOSIS — F1994 Other psychoactive substance use, unspecified with psychoactive substance-induced mood disorder: Secondary | ICD-10-CM | POA: Diagnosis not present

## 2021-04-17 DIAGNOSIS — R45851 Suicidal ideations: Secondary | ICD-10-CM | POA: Diagnosis not present

## 2021-04-17 DIAGNOSIS — F332 Major depressive disorder, recurrent severe without psychotic features: Secondary | ICD-10-CM | POA: Diagnosis not present

## 2021-04-17 NOTE — ED Notes (Signed)
Pt on phone speaking with someone about bringing clothes to the facility.  Asked pt if he has been able to get a hold of [facility] pt reports he has not yet spoken to anyone but has left a message.  Will continue to encourage pt to reach out to [facility] per social work.

## 2021-04-17 NOTE — ED Notes (Signed)
Pt requested number for homeless shelter in [place] "home of refuge outreach". Pt given phone number.

## 2021-04-17 NOTE — ED Notes (Signed)
Pt eating lunch

## 2021-04-17 NOTE — Progress Notes (Signed)
Pt is resting quietly. No distress noted. Monitoring for pt's safety. 

## 2021-04-17 NOTE — ED Provider Notes (Signed)
Behavioral Health Progress Note  Date and Time: 04/17/2021 12:14 PM Name: Nathan Koch MRN:  [id number]  Subjective:   30 yo male with history of substance abuse, MDD, substance induced mood disorder, substance induced psychotic disorder, PTSD, bipolar disorder who presented initially to [facility] ED on 12/19 with SI with a plan to overdose on pills. Patient was unable to contract for safety and was transferred to the [facility] on 12/20 for further treatment and then 12/21 to the [facility] for crisis stabilization. UDS +amphetamine.  Patient seen and chart reviewed.  He has been medication compliant and has been appropriate with staff and peers on the unit.  He has been attending groups.  Patient found laying in bed in no acute distress.  Patient states that his mood is "about the same".  He reports ongoing passive SI without a plan although is unable to contract for safety outside the hospital at this time.  He denies HI/AVH.  He denies all physical complaints this morning.  Patient states that he has attempted to call [facility], however he has been unable to reach anybody.  Support and encouragement provided.  Advised patient to call [facility] when as soon as possible as this will increase his likelihood of a timely acceptance.  Patient verbalized understanding.  Diagnosis:  Final diagnoses:  Severe episode of recurrent major depressive disorder, without psychotic features ([name])  Methamphetamine use disorder, moderate ([name])  Amphetamine-induced mood disorder ([name])  Passive suicidal ideations  Homelessness    Total Time spent with patient: 15 minutes  Past Psychiatric History: substance abuse, MDD, substance induced mood disorder, substance induced psychotic disorder, PTSD, bipolar disorder  Past Medical History:  Past Medical History:  Diagnosis Date   Anxiety    Anxiety disorder    Asthma    Bipolar 1 disorder ([name])    Depression    Methamphetamine abuse ([name])    PTSD (post-traumatic stress  disorder)     Past Surgical History:  Procedure Laterality Date   CLOSED REDUCTION MANDIBLE N/A 07/02/2018   Procedure: CLOSED REDUCTION MANDIBULAR;  Surgeon: Vernie Murders, MD;  Location: [facility] ORS;  Service: ENT;  Laterality: N/A;   Family History:  Family History  Problem Relation Age of Onset   Asthma Mother    Cancer Mother        breast cancer   Ulcers Mother    Cancer Father        esophogeal cancer   Hypertension Father    Asthma Brother    Family Psychiatric  History: denied Social History:  Social History   Substance and Sexual Activity  Alcohol Use Not Currently   Alcohol/week: 13.0 standard drinks   Types: 6 Cans of beer, 7 Standard drinks or equivalent per week   Comment: previously heavy drinker 2016. most recent 4 beer/ day drinker and none since 01-11-2020     Social History   Substance and Sexual Activity  Drug Use Yes   Types: Methamphetamines   Comment: states he uses 1 g every other day    Social History   Socioeconomic History   Marital status: Single    Spouse name: Not on file   Number of children: 1   Years of education: 10   Highest education level: 10th grade  Occupational History   Not on file  Tobacco Use   Smoking status: Every Day    Packs/day: 0.50    Years: 8.00    Pack years: 4.00    Types: Cigarettes  Smokeless tobacco: Never  Vaping Use   Vaping Use: Every day   Substances: Nicotine, Flavoring  Substance and Sexual Activity   Alcohol use: Not Currently    Alcohol/week: 13.0 standard drinks    Types: 6 Cans of beer, 7 Standard drinks or equivalent per week    Comment: previously heavy drinker 2016. most recent 4 beer/ day drinker and none since 01-11-2020   Drug use: Yes    Types: Methamphetamines    Comment: states he uses 1 g every other day   Sexual activity: Not on file  Other Topics Concern   Not on file  Social History Narrative   Not on file   Social Determinants of Health   Financial Resource Strain: Not  on file  Food Insecurity: Not on file  Transportation Needs: Not on file  Physical Activity: Not on file  Stress: Not on file  Social Connections: Not on file   SDOH:  SDOH Screenings   Alcohol Screen: Low Risk    Last Alcohol Screening Score (AUDIT): 1  Depression (PHQ2-9): Medium Risk   PHQ-2 Score: 16  Financial Resource Strain: Not on file  Food Insecurity: Not on file  Housing: Not on file  Physical Activity: Not on file  Social Connections: Not on file  Stress: Not on file  Tobacco Use: High Risk   Smoking Tobacco Use: Every Day   Smokeless Tobacco Use: Never   Passive Exposure: Not on file  Transportation Needs: Not on file   Additional Social History:                         Sleep: Good  Appetite:  Good  Current Medications:  Current Facility-Administered Medications  Medication Dose Route Frequency Provider Last Rate Last Admin   acetaminophen (TYLENOL) tablet 650 mg  650 mg Oral Q6H PRN Rankin, Shuvon B, NP       albuterol (VENTOLIN HFA) 108 (90 Base) MCG/ACT inhaler 2 puff  2 puff Inhalation Q4H PRN Ladona Ridgel, Cody W, PA-C       alum & mag hydroxide-simeth (MAALOX/MYLANTA) 200-200-20 MG/5ML suspension 30 mL  30 mL Oral Q4H PRN Rankin, Shuvon B, NP       hydrOXYzine (ATARAX) tablet 25 mg  25 mg Oral TID PRN Rankin, Shuvon B, NP       magnesium hydroxide (MILK OF MAGNESIA) suspension 30 mL  30 mL Oral Daily PRN Rankin, Shuvon B, NP       sertraline (ZOLOFT) tablet 50 mg  50 mg Oral QHS Ladona Ridgel, Cody W, PA-C   50 mg at 04/16/21 2114   sodium chloride (OCEAN) 0.65 % nasal spray 1 spray  1 spray Each Nare PRN Jaclyn Shaggy, PA-C       traZODone (DESYREL) tablet 50 mg  50 mg Oral QHS PRN Melbourne Abts W, PA-C   50 mg at 04/16/21 2114   Current Outpatient Medications  Medication Sig Dispense Refill   albuterol (VENTOLIN HFA) 108 (90 Base) MCG/ACT inhaler Inhale 2 puffs into the lungs every 4 (four) hours as needed for wheezing or shortness of breath. (Patient  not taking: Reported on 02/12/2021) 6.7 g 1   sertraline (ZOLOFT) 50 MG tablet Take 1 tablet (50 mg total) by mouth at bedtime. (Patient not taking: Reported on 04/15/2021) 7 tablet 0    Labs  Lab Results:  Admission on 04/14/2021, Discharged on 04/15/2021  Component Date Value Ref Range Status   SARS Coronavirus 2 by RT  PCR 04/14/2021 NEGATIVE  NEGATIVE Final   Comment: (NOTE) SARS-CoV-2 target nucleic acids are NOT DETECTED.  The SARS-CoV-2 RNA is generally detectable in upper respiratory specimens during the acute phase of infection. The lowest concentration of SARS-CoV-2 viral copies this assay can detect is 138 copies/mL. A negative result does not preclude SARS-Cov-2 infection and should not be used as the sole basis for treatment or other patient management decisions. A negative result may occur with  improper specimen collection/handling, submission of specimen other than nasopharyngeal swab, presence of viral mutation(s) within the areas targeted by this assay, and inadequate number of viral copies(<138 copies/mL). A negative result must be combined with clinical observations, patient history, and epidemiological information. The expected result is Negative.  Fact Sheet for Patients:  BloggerCourse.com  Fact Sheet for Healthcare Providers:  SeriousBroker.it  This test is no                          t yet approved or cleared by the [place] FDA and  has been authorized for detection and/or diagnosis of SARS-CoV-2 by FDA under an Emergency Use Authorization (EUA). This EUA will remain  in effect (meaning this test can be used) for the duration of the COVID-19 declaration under Section 564(b)(1) of the Act, 21 U.S.C.section 360bbb-3(b)(1), unless the authorization is terminated  or revoked sooner.       Influenza A by PCR 04/14/2021 NEGATIVE  NEGATIVE Final   Influenza B by PCR 04/14/2021 NEGATIVE  NEGATIVE Final    Comment: (NOTE) The Xpert Xpress SARS-CoV-2/FLU/RSV plus assay is intended as an aid in the diagnosis of influenza from Nasopharyngeal swab specimens and should not be used as a sole basis for treatment. Nasal washings and aspirates are unacceptable for Xpert Xpress SARS-CoV-2/FLU/RSV testing.  Fact Sheet for Patients: BloggerCourse.com  Fact Sheet for Healthcare Providers: SeriousBroker.it  This test is not yet approved or cleared by the [place] FDA and has been authorized for detection and/or diagnosis of SARS-CoV-2 by FDA under an Emergency Use Authorization (EUA). This EUA will remain in effect (meaning this test can be used) for the duration of the COVID-19 declaration under Section 564(b)(1) of the Act, 21 U.S.C. section 360bbb-3(b)(1), unless the authorization is terminated or revoked.  Performed at [facility], [address], [place], [place] [address]    Sodium 04/14/2021 134 (L)  135 - 145 mmol/L Final   Potassium 04/14/2021 3.6  3.5 - 5.1 mmol/L Final   Chloride 04/14/2021 94 (L)  98 - 111 mmol/L Final   CO2 04/14/2021 28  22 - 32 mmol/L Final   Glucose, Bld 04/14/2021 119 (H)  70 - 99 mg/dL Final   Glucose reference range applies only to samples taken after fasting for at least 8 hours.   BUN 04/14/2021 14  6 - 20 mg/dL Final   Creatinine, Ser 04/14/2021 0.77  0.61 - 1.24 mg/dL Final   Calcium 60/45/4098 9.7  8.9 - 10.3 mg/dL Final   Total Protein 11/91/4782 8.8 (H)  6.5 - 8.1 g/dL Final   Albumin 95/62/1308 4.4  3.5 - 5.0 g/dL Final   AST 65/78/4696 47 (H)  15 - 41 U/L Final   ALT 04/14/2021 46 (H)  0 - 44 U/L Final   Alkaline Phosphatase 04/14/2021 71  38 - 126 U/L Final   Total Bilirubin 04/14/2021 1.0  0.3 - 1.2 mg/dL Final   GFR, Estimated 04/14/2021 >60  >60 mL/min Final   Comment: (  NOTE) Calculated using the CKD-EPI Creatinine Equation (2021)    Anion gap 04/14/2021 12  5 - 15 Final   Performed at  [facility], [address], [place], [place] [address]   Alcohol, Ethyl (B) 04/14/2021 <10  <10 mg/dL Final   Comment: (NOTE) Lowest detectable limit for serum alcohol is 10 mg/dL.  For medical purposes only. Performed at [facility], [address] [place], [place] [address]    Salicylate Lvl 04/14/2021 <7.0 (L)  7.0 - 30.0 mg/dL Final   Performed at [facility], [address] [place], [place] [address]   Acetaminophen (Tylenol), Serum 04/14/2021 <10 (L)  10 - 30 ug/mL Final   Comment: (NOTE) Therapeutic concentrations vary significantly. A range of 10-30 ug/mL  may be an effective concentration for many patients. However, some  are best treated at concentrations outside of this range. Acetaminophen concentrations >150 ug/mL at 4 hours after ingestion  and >50 ug/mL at 12 hours after ingestion are often associated with  toxic reactions.  Performed at [facility], [address] [place], [place] [address]    WBC 04/14/2021 12.6 (H)  4.0 - 10.5 K/uL Final   RBC 04/14/2021 5.35  4.22 - 5.81 MIL/uL Final   Hemoglobin 04/14/2021 15.7  13.0 - 17.0 g/dL Final   HCT 65/78/4696 46.9  39.0 - 52.0 % Final   MCV 04/14/2021 87.7  80.0 - 100.0 fL Final   MCH 04/14/2021 29.3  26.0 - 34.0 pg Final   MCHC 04/14/2021 33.5  30.0 - 36.0 g/dL Final   RDW 29/52/8413 13.5  11.5 - 15.5 % Final   Platelets 04/14/2021 481 (H)  150 - 400 K/uL Final   nRBC 04/14/2021 0.0  0.0 - 0.2 % Final   Performed at [facility], [address] [place], [place] [address]   Opiates 04/14/2021 NONE DETECTED  NONE DETECTED Final   Cocaine 04/14/2021 NONE DETECTED  NONE DETECTED Final   Benzodiazepines 04/14/2021 NONE DETECTED  NONE DETECTED Final   Amphetamines 04/14/2021 POSITIVE (A)  NONE DETECTED Final   Tetrahydrocannabinol 04/14/2021 NONE DETECTED  NONE DETECTED Final   Barbiturates 04/14/2021 NONE DETECTED  NONE DETECTED Final   Comment: (NOTE) DRUG SCREEN FOR MEDICAL  PURPOSES ONLY.  IF CONFIRMATION IS NEEDED FOR ANY PURPOSE, NOTIFY LAB WITHIN 5 DAYS.  LOWEST DETECTABLE LIMITS FOR URINE DRUG SCREEN Drug Class                     Cutoff (ng/mL) Amphetamine and metabolites    1000 Barbiturate and metabolites    200 Benzodiazepine                 200 Tricyclics and metabolites     300 Opiates and metabolites        300 Cocaine and metabolites        300 THC                            50 Performed at [facility], [address] [place], [place] [address]   Admission on 04/05/2021, Discharged on 04/05/2021  Component Date Value Ref Range Status   Lipase 04/05/2021 24  11 - 51 U/L Final   Performed at [facility], [address] [place], [place] [address]   Sodium 04/05/2021 140  135 - 145 mmol/L Final   Potassium 04/05/2021 3.8  3.5 - 5.1 mmol/L Final   Chloride 04/05/2021 105  98 - 111 mmol/L Final   CO2 04/05/2021 28  22 - 32 mmol/L Final   Glucose, Bld 04/05/2021 88  70 - 99 mg/dL Final   Glucose reference range applies only to samples taken after fasting for at least 8 hours.   BUN 04/05/2021 10  6 - 20 mg/dL Final   Creatinine, Ser 04/05/2021 0.85  0.61 - 1.24 mg/dL Final   Calcium 16/01/9603 9.9  8.9 - 10.3 mg/dL Final   Total Protein 54/12/8117 7.9  6.5 - 8.1 g/dL Final   Albumin 14/78/2956 4.6  3.5 - 5.0 g/dL Final   AST 21/30/8657 13 (L)  15 - 41 U/L Final   ALT 04/05/2021 11  0 - 44 U/L Final   Alkaline Phosphatase 04/05/2021 65  38 - 126 U/L Final   Total Bilirubin 04/05/2021 0.8  0.3 - 1.2 mg/dL Final   GFR, Estimated 04/05/2021 >60  >60 mL/min Final   Comment: (NOTE) Calculated using the CKD-EPI Creatinine Equation (2021)    Anion gap 04/05/2021 7  5 - 15 Final   Performed at [facility], [address], [place], [place] [address]   WBC 04/05/2021 6.5  4.0 - 10.5 K/uL Final   RBC 04/05/2021 5.25  4.22 - 5.81 MIL/uL Final   Hemoglobin 04/05/2021 14.6  13.0 - 17.0 g/dL Final   HCT  29/52/8413 45.2  39.0 - 52.0 % Final   MCV 04/05/2021 86.1  80.0 - 100.0 fL Final   MCH 04/05/2021 27.8  26.0 - 34.0 pg Final   MCHC 04/05/2021 32.3  30.0 - 36.0 g/dL Final   RDW 24/40/1027 13.9  11.5 - 15.5 % Final   Platelets 04/05/2021 324  150 - 400 K/uL Final   nRBC 04/05/2021 0.0  0.0 - 0.2 % Final   Performed at [facility], [address] [place], [place] [address]   SARS Coronavirus 2 by RT PCR 04/05/2021 NEGATIVE  NEGATIVE Final   Comment: (NOTE) SARS-CoV-2 target nucleic acids are NOT DETECTED.  The SARS-CoV-2 RNA is generally detectable in upper respiratory specimens during the acute phase of infection. The lowest concentration of SARS-CoV-2 viral copies this assay can detect is 138 copies/mL. A negative result does not preclude SARS-Cov-2 infection and should not be used as the sole basis for treatment or other patient management decisions. A negative result may occur with  improper specimen collection/handling, submission of specimen other than nasopharyngeal swab, presence of viral mutation(s) within the areas targeted by this assay, and inadequate number of viral copies(<138 copies/mL). A negative result must be combined with clinical observations, patient history, and epidemiological information. The expected result is Negative.  Fact Sheet for Patients:  BloggerCourse.com  Fact Sheet for Healthcare Providers:  SeriousBroker.it  This test is no                          t yet approved or cleared by the [place] FDA and  has been authorized for detection and/or diagnosis of SARS-CoV-2 by FDA under an Emergency Use Authorization (EUA). This EUA will remain  in effect (meaning this test can be used) for the duration of the COVID-19 declaration under Section 564(b)(1) of the Act, 21 U.S.C.section 360bbb-3(b)(1), unless the authorization is terminated  or revoked sooner.       Influenza A  by PCR 04/05/2021 NEGATIVE  NEGATIVE Final   Influenza B by PCR 04/05/2021 NEGATIVE  NEGATIVE Final   Comment: (NOTE) The Xpert Xpress  SARS-CoV-2/FLU/RSV plus assay is intended as an aid in the diagnosis of influenza from Nasopharyngeal swab specimens and should not be used as a sole basis for treatment. Nasal washings and aspirates are unacceptable for Xpert Xpress SARS-CoV-2/FLU/RSV testing.  Fact Sheet for Patients: BloggerCourse.com  Fact Sheet for Healthcare Providers: SeriousBroker.it  This test is not yet approved or cleared by the [place] FDA and has been authorized for detection and/or diagnosis of SARS-CoV-2 by FDA under an Emergency Use Authorization (EUA). This EUA will remain in effect (meaning this test can be used) for the duration of the COVID-19 declaration under Section 564(b)(1) of the Act, 21 U.S.C. section 360bbb-3(b)(1), unless the authorization is terminated or revoked.  Performed at [facility], [address] [place], [place] [address]   Admission on 02/22/2021, Discharged on 02/22/2021  Component Date Value Ref Range Status   Sodium 02/22/2021 133 (L)  135 - 145 mmol/L Final   Potassium 02/22/2021 3.8  3.5 - 5.1 mmol/L Final   Chloride 02/22/2021 93 (L)  98 - 111 mmol/L Final   CO2 02/22/2021 30  22 - 32 mmol/L Final   Glucose, Bld 02/22/2021 116 (H)  70 - 99 mg/dL Final   Glucose reference range applies only to samples taken after fasting for at least 8 hours.   BUN 02/22/2021 26 (H)  6 - 20 mg/dL Final   Creatinine, Ser 02/22/2021 0.95  0.61 - 1.24 mg/dL Final   Calcium 60/45/4098 9.7  8.9 - 10.3 mg/dL Final   Total Protein 11/91/4782 8.6 (H)  6.5 - 8.1 g/dL Final   Albumin 95/62/1308 4.7  3.5 - 5.0 g/dL Final   AST 65/78/4696 51 (H)  15 - 41 U/L Final   ALT 02/22/2021 34  0 - 44 U/L Final   Alkaline Phosphatase 02/22/2021 70  38 - 126 U/L Final   Total Bilirubin  02/22/2021 2.6 (H)  0.3 - 1.2 mg/dL Final   GFR, Estimated 02/22/2021 >60  >60 mL/min Final   Comment: (NOTE) Calculated using the CKD-EPI Creatinine Equation (2021)    Anion gap 02/22/2021 10  5 - 15 Final   Performed at [facility], [address] [place], [place] [address]   Alcohol, Ethyl (B) 02/22/2021 <10  <10 mg/dL Final   Comment: (NOTE) Lowest detectable limit for serum alcohol is 10 mg/dL.  For medical purposes only. Performed at [facility], [address] [place], [place] [address]    WBC 02/22/2021 11.4 (H)  4.0 - 10.5 K/uL Final   RBC 02/22/2021 5.00  4.22 - 5.81 MIL/uL Final   Hemoglobin 02/22/2021 14.6  13.0 - 17.0 g/dL Final   HCT 40/01/2724 41.4  39.0 - 52.0 % Final   MCV 02/22/2021 82.8  80.0 - 100.0 fL Final   MCH 02/22/2021 29.2  26.0 - 34.0 pg Final   MCHC 02/22/2021 35.3  30.0 - 36.0 g/dL Final   RDW 36/64/4034 13.1  11.5 - 15.5 % Final   Platelets 02/22/2021 399  150 - 400 K/uL Final   nRBC 02/22/2021 0.0  0.0 - 0.2 % Final   Performed at [facility], [address] [place], [place] [address]   Tricyclic, Ur Screen 02/22/2021 NONE DETECTED  NONE DETECTED Final   Amphetamines, Ur Screen 02/22/2021 POSITIVE (A)  NONE DETECTED Final   MDMA (Ecstasy)Ur Screen 02/22/2021 NONE DETECTED  NONE DETECTED Final   Cocaine Metabolite,Ur [place] 02/22/2021 NONE DETECTED  NONE DETECTED Final   Opiate, Ur Screen 02/22/2021  NONE DETECTED  NONE DETECTED Final   Phencyclidine (PCP) Ur S 02/22/2021 NONE DETECTED  NONE DETECTED Final   Cannabinoid 50 Ng, Ur [place] 02/22/2021 POSITIVE (A)  NONE DETECTED Final   Barbiturates, Ur Screen 02/22/2021 NONE DETECTED  NONE DETECTED Final   Benzodiazepine, Ur Scrn 02/22/2021 NONE DETECTED  NONE DETECTED Final   Methadone Scn, Ur 02/22/2021 NONE DETECTED  NONE DETECTED Final   Comment: (NOTE) Tricyclics + metabolites, urine    Cutoff 1000 ng/mL Amphetamines + metabolites, urine  Cutoff 1000 ng/mL MDMA  (Ecstasy), urine              Cutoff 500 ng/mL Cocaine Metabolite, urine          Cutoff 300 ng/mL Opiate + metabolites, urine        Cutoff 300 ng/mL Phencyclidine (PCP), urine         Cutoff 25 ng/mL Cannabinoid, urine                 Cutoff 50 ng/mL Barbiturates + metabolites, urine  Cutoff 200 ng/mL Benzodiazepine, urine              Cutoff 200 ng/mL Methadone, urine                   Cutoff 300 ng/mL  The urine drug screen provides only a preliminary, unconfirmed analytical test result and should not be used for non-medical purposes. Clinical consideration and professional judgment should be applied to any positive drug screen result due to possible interfering substances. A more specific alternate chemical method must be used in order to obtain a confirmed analytical result. Gas chromatography / mass spectrometry (GC/MS) is the preferred confirm                          atory method. Performed at [facility], [address] [place], [place] [address]   Admission on 02/18/2021, Discharged on 02/18/2021  Component Date Value Ref Range Status   WBC 02/18/2021 15.0 (H)  4.0 - 10.5 K/uL Final   RBC 02/18/2021 5.61  4.22 - 5.81 MIL/uL Final   Hemoglobin 02/18/2021 15.9  13.0 - 17.0 g/dL Final   HCT 60/45/4098 46.8  39.0 - 52.0 % Final   MCV 02/18/2021 83.4  80.0 - 100.0 fL Final   MCH 02/18/2021 28.3  26.0 - 34.0 pg Final   MCHC 02/18/2021 34.0  30.0 - 36.0 g/dL Final   RDW 11/91/4782 13.3  11.5 - 15.5 % Final   Platelets 02/18/2021 430 (H)  150 - 400 K/uL Final   nRBC 02/18/2021 0.0  0.0 - 0.2 % Final   Neutrophils Relative % 02/18/2021 74  % Final   Neutro Abs 02/18/2021 11.1 (H)  1.7 - 7.7 K/uL Final   Lymphocytes Relative 02/18/2021 17  % Final   Lymphs Abs 02/18/2021 2.5  0.7 - 4.0 K/uL Final   Monocytes Relative 02/18/2021 9  % Final   Monocytes Absolute 02/18/2021 1.3 (H)  0.1 - 1.0 K/uL Final   Eosinophils Relative 02/18/2021 0  % Final   Eosinophils  Absolute 02/18/2021 0.0  0.0 - 0.5 K/uL Final   Basophils Relative 02/18/2021 0  % Final   Basophils Absolute 02/18/2021 0.1  0.0 - 0.1 K/uL Final   Immature Granulocytes 02/18/2021 0  % Final   Abs Immature Granulocytes 02/18/2021 0.04  0.00 - 0.07 K/uL Final   Performed at [facility], [address] [place],  [place] [address]   Sodium 02/18/2021 136  135 - 145 mmol/L Final   Potassium 02/18/2021 3.8  3.5 - 5.1 mmol/L Final   Chloride 02/18/2021 99  98 - 111 mmol/L Final   CO2 02/18/2021 24  22 - 32 mmol/L Final   Glucose, Bld 02/18/2021 139 (H)  70 - 99 mg/dL Final   Glucose reference range applies only to samples taken after fasting for at least 8 hours.   BUN 02/18/2021 18  6 - 20 mg/dL Final   Creatinine, Ser 02/18/2021 1.26 (H)  0.61 - 1.24 mg/dL Final   Calcium 60/45/4098 10.3  8.9 - 10.3 mg/dL Final   Total Protein 11/91/4782 9.7 (H)  6.5 - 8.1 g/dL Final   Albumin 95/62/1308 5.2 (H)  3.5 - 5.0 g/dL Final   AST 65/78/4696 52 (H)  15 - 41 U/L Final   ALT 02/18/2021 24  0 - 44 U/L Final   Alkaline Phosphatase 02/18/2021 83  38 - 126 U/L Final   Total Bilirubin 02/18/2021 1.9 (H)  0.3 - 1.2 mg/dL Final   GFR, Estimated 02/18/2021 >60  >60 mL/min Final   Comment: (NOTE) Calculated using the CKD-EPI Creatinine Equation (2021)    Anion gap 02/18/2021 13  5 - 15 Final   Performed at [facility], [address] [place], [place] [address]   Lipase 02/18/2021 24  11 - 51 U/L Final   Performed at [facility], [address] [place], [place] [address]   SARS Coronavirus 2 by RT PCR 02/18/2021 NEGATIVE  NEGATIVE Final   Comment: (NOTE) SARS-CoV-2 target nucleic acids are NOT DETECTED.  The SARS-CoV-2 RNA is generally detectable in upper respiratory specimens during the acute phase of infection. The lowest concentration of SARS-CoV-2 viral copies this assay can detect is 138 copies/mL. A negative result does not preclude SARS-Cov-2 infection and  should not be used as the sole basis for treatment or other patient management decisions. A negative result may occur with  improper specimen collection/handling, submission of specimen other than nasopharyngeal swab, presence of viral mutation(s) within the areas targeted by this assay, and inadequate number of viral copies(<138 copies/mL). A negative result must be combined with clinical observations, patient history, and epidemiological information. The expected result is Negative.  Fact Sheet for Patients:  BloggerCourse.com  Fact Sheet for Healthcare Providers:  SeriousBroker.it  This test is no                          t yet approved or cleared by the [place] FDA and  has been authorized for detection and/or diagnosis of SARS-CoV-2 by FDA under an Emergency Use Authorization (EUA). This EUA will remain  in effect (meaning this test can be used) for the duration of the COVID-19 declaration under Section 564(b)(1) of the Act, 21 U.S.C.section 360bbb-3(b)(1), unless the authorization is terminated  or revoked sooner.       Influenza A by PCR 02/18/2021 NEGATIVE  NEGATIVE Final   Influenza B by PCR 02/18/2021 NEGATIVE  NEGATIVE Final   Comment: (NOTE) The Xpert Xpress SARS-CoV-2/FLU/RSV plus assay is intended as an aid in the diagnosis of influenza from Nasopharyngeal swab specimens and should not be used as a sole basis for treatment. Nasal washings and aspirates are unacceptable for Xpert Xpress SARS-CoV-2/FLU/RSV testing.  Fact Sheet for Patients: BloggerCourse.com  Fact Sheet for Healthcare Providers: SeriousBroker.it  This test is not yet approved or cleared by the [place]  FDA and has been authorized for detection and/or diagnosis of SARS-CoV-2 by FDA under an Emergency Use Authorization (EUA). This EUA will remain in effect (meaning this test can be used)  for the duration of the COVID-19 declaration under Section 564(b)(1) of the Act, 21 U.S.C. section 360bbb-3(b)(1), unless the authorization is terminated or revoked.  Performed at [facility], [address] [place], [place] [address]   Admission on 02/13/2021, Discharged on 02/14/2021  Component Date Value Ref Range Status   SARS Coronavirus 2 by RT PCR 02/13/2021 NEGATIVE  NEGATIVE Final   Comment: (NOTE) SARS-CoV-2 target nucleic acids are NOT DETECTED.  The SARS-CoV-2 RNA is generally detectable in upper respiratory specimens during the acute phase of infection. The lowest concentration of SARS-CoV-2 viral copies this assay can detect is 138 copies/mL. A negative result does not preclude SARS-Cov-2 infection and should not be used as the sole basis for treatment or other patient management decisions. A negative result may occur with  improper specimen collection/handling, submission of specimen other than nasopharyngeal swab, presence of viral mutation(s) within the areas targeted by this assay, and inadequate number of viral copies(<138 copies/mL). A negative result must be combined with clinical observations, patient history, and epidemiological information. The expected result is Negative.  Fact Sheet for Patients:  BloggerCourse.com  Fact Sheet for Healthcare Providers:  SeriousBroker.it  This test is no                          t yet approved or cleared by the [place] FDA and  has been authorized for detection and/or diagnosis of SARS-CoV-2 by FDA under an Emergency Use Authorization (EUA). This EUA will remain  in effect (meaning this test can be used) for the duration of the COVID-19 declaration under Section 564(b)(1) of the Act, 21 U.S.C.section 360bbb-3(b)(1), unless the authorization is terminated  or revoked sooner.       Influenza A by PCR 02/13/2021 NEGATIVE  NEGATIVE Final   Influenza B  by PCR 02/13/2021 NEGATIVE  NEGATIVE Final   Comment: (NOTE) The Xpert Xpress SARS-CoV-2/FLU/RSV plus assay is intended as an aid in the diagnosis of influenza from Nasopharyngeal swab specimens and should not be used as a sole basis for treatment. Nasal washings and aspirates are unacceptable for Xpert Xpress SARS-CoV-2/FLU/RSV testing.  Fact Sheet for Patients: BloggerCourse.com  Fact Sheet for Healthcare Providers: SeriousBroker.it  This test is not yet approved or cleared by the [place] FDA and has been authorized for detection and/or diagnosis of SARS-CoV-2 by FDA under an Emergency Use Authorization (EUA). This EUA will remain in effect (meaning this test can be used) for the duration of the COVID-19 declaration under Section 564(b)(1) of the Act, 21 U.S.C. section 360bbb-3(b)(1), unless the authorization is terminated or revoked.  Performed at [facility], [address] [place], [place] [address]    Alcohol, Ethyl (B) 02/13/2021 <10  <10 mg/dL Final   Comment: (NOTE) Lowest detectable limit for serum alcohol is 10 mg/dL.  For medical purposes only. Performed at [facility], [address], [place], [place] [address]    POC Amphetamine UR 02/13/2021 Positive (A)  NONE DETECTED (Cut Off Level 1000 ng/mL) Final   POC Secobarbital (BAR) 02/13/2021 None Detected  NONE DETECTED (Cut Off Level 300 ng/mL) Final   POC Buprenorphine (BUP) 02/13/2021 None Detected  NONE DETECTED (Cut Off Level 10 ng/mL) Final   POC Oxazepam (BZO) 02/13/2021 None Detected  NONE DETECTED (Cut Off Level 300 ng/mL) Final   POC Cocaine UR 02/13/2021 None Detected  NONE DETECTED (Cut Off Level 300 ng/mL) Final   POC Methamphetamine UR 02/13/2021 Positive (A)  NONE DETECTED (Cut Off Level 1000 ng/mL) Final   POC Morphine 02/13/2021 None Detected  NONE DETECTED (Cut Off Level 300 ng/mL) Final   POC Oxycodone UR 02/13/2021 None Detected   NONE DETECTED (Cut Off Level 100 ng/mL) Final   POC Methadone UR 02/13/2021 None Detected  NONE DETECTED (Cut Off Level 300 ng/mL) Final   POC Marijuana UR 02/13/2021 None Detected  NONE DETECTED (Cut Off Level 50 ng/mL) Final   SARSCOV2ONAVIRUS 2 AG 02/13/2021 NEGATIVE  NEGATIVE Final   Comment: (NOTE) SARS-CoV-2 antigen NOT DETECTED.   Negative results are presumptive.  Negative results do not preclude SARS-CoV-2 infection and should not be used as the sole basis for treatment or other patient management decisions, including infection  control decisions, particularly in the presence of clinical signs and  symptoms consistent with COVID-19, or in those who have been in contact with the virus.  Negative results must be combined with clinical observations, patient history, and epidemiological information. The expected result is Negative.  Fact Sheet for Patients: https://www.jennings-kim.com/  Fact Sheet for Healthcare Providers: https://alexander-rogers.biz/  This test is not yet approved or cleared by the [place] FDA and  has been authorized for detection and/or diagnosis of SARS-CoV-2 by FDA under an Emergency Use Authorization (EUA).  This EUA will remain in effect (meaning this test can be used) for the duration of  the COV                          ID-19 declaration under Section 564(b)(1) of the Act, 21 U.S.C. section 360bbb-3(b)(1), unless the authorization is terminated or revoked sooner.    Admission on 02/11/2021, Discharged on 02/12/2021  Component Date Value Ref Range Status   Sodium 02/11/2021 140  135 - 145 mmol/L Final   Potassium 02/11/2021 4.1  3.5 - 5.1 mmol/L Final   Chloride 02/11/2021 103  98 - 111 mmol/L Final   CO2 02/11/2021 24  22 - 32 mmol/L Final   Glucose, Bld 02/11/2021 89  70 - 99 mg/dL Final   Glucose reference range applies only to samples taken after fasting for at least 8 hours.   BUN 02/11/2021 12  6 - 20 mg/dL  Final   Creatinine, Ser 02/11/2021 0.89  0.61 - 1.24 mg/dL Final   Calcium 36/64/4034 10.2  8.9 - 10.3 mg/dL Final   Total Protein 74/25/9563 8.4 (H)  6.5 - 8.1 g/dL Final   Albumin 87/56/4332 4.6  3.5 - 5.0 g/dL Final   AST 95/18/8416 40  15 - 41 U/L Final   ALT 02/11/2021 19  0 - 44 U/L Final   Alkaline Phosphatase 02/11/2021 83  38 - 126 U/L Final   Total Bilirubin 02/11/2021 1.7 (H)  0.3 - 1.2 mg/dL Final   GFR, Estimated 02/11/2021 >60  >60 mL/min Final   Comment: (NOTE) Calculated using the CKD-EPI Creatinine Equation (2021)    Anion gap 02/11/2021 13  5 - 15 Final   Performed at [facility], [address] [place], [place] [address]   Alcohol, Ethyl (B) 02/11/2021 <10  <10 mg/dL Final   Comment: (NOTE) Lowest detectable limit for serum alcohol is 10 mg/dL.  For medical purposes only. Performed at [facility], [address] [place],  [place] [address]    Salicylate Lvl 02/11/2021 <7.0 (L)  7.0 - 30.0 mg/dL Final   Performed at [facility], [address], [place], [place] [address]   Acetaminophen (Tylenol), Serum 02/11/2021 <10 (L)  10 - 30 ug/mL Final   Comment: (NOTE) Therapeutic concentrations vary significantly. A range of 10-30 ug/mL  may be an effective concentration for many patients. However, some  are best treated at concentrations outside of this range. Acetaminophen concentrations >150 ug/mL at 4 hours after ingestion  and >50 ug/mL at 12 hours after ingestion are often associated with  toxic reactions.  Performed at [facility], [address], [place], [place] [address]    WBC 02/11/2021 11.6 (H)  4.0 - 10.5 K/uL Final   RBC 02/11/2021 5.47  4.22 - 5.81 MIL/uL Final   Hemoglobin 02/11/2021 15.5  13.0 - 17.0 g/dL Final   HCT 91/47/8295 46.5  39.0 - 52.0 % Final   MCV 02/11/2021 85.0  80.0 - 100.0 fL Final   MCH 02/11/2021 28.3  26.0 - 34.0 pg Final   MCHC 02/11/2021 33.3  30.0 - 36.0 g/dL Final   RDW 62/13/0865 13.8  11.5 -  15.5 % Final   Platelets 02/11/2021 337  150 - 400 K/uL Final   nRBC 02/11/2021 0.0  0.0 - 0.2 % Final   Performed at [facility], [address], [place], [place] [address]   Opiates 02/11/2021 NONE DETECTED  NONE DETECTED Final   Cocaine 02/11/2021 NONE DETECTED  NONE DETECTED Final   Benzodiazepines 02/11/2021 NONE DETECTED  NONE DETECTED Final   Amphetamines 02/11/2021 POSITIVE (A)  NONE DETECTED Final   Tetrahydrocannabinol 02/11/2021 NONE DETECTED  NONE DETECTED Final   Barbiturates 02/11/2021 NONE DETECTED  NONE DETECTED Final   Comment: (NOTE) DRUG SCREEN FOR MEDICAL PURPOSES ONLY.  IF CONFIRMATION IS NEEDED FOR ANY PURPOSE, NOTIFY LAB WITHIN 5 DAYS.  LOWEST DETECTABLE LIMITS FOR URINE DRUG SCREEN Drug Class                     Cutoff (ng/mL) Amphetamine and metabolites    1000 Barbiturate and metabolites    200 Benzodiazepine                 200 Tricyclics and metabolites     300 Opiates and metabolites        300 Cocaine and metabolites        300 THC                            50 Performed at [facility], [address] [place], [place] [address]    SARS Coronavirus 2 by RT PCR 02/11/2021 NEGATIVE  NEGATIVE Final   Comment: (NOTE) SARS-CoV-2 target nucleic acids are NOT DETECTED.  The SARS-CoV-2 RNA is generally detectable in upper respiratory specimens during the acute phase of infection. The lowest concentration of SARS-CoV-2 viral copies this assay can detect is 138 copies/mL. A negative result does not preclude SARS-Cov-2 infection and should not be used as the sole basis for treatment or other patient management decisions. A negative result may occur with  improper specimen collection/handling, submission of specimen other than nasopharyngeal swab, presence of viral mutation(s) within the areas targeted by this assay, and inadequate number of viral copies(<138 copies/mL). A negative result must be combined with clinical observations, patient  history, and epidemiological information. The expected result is Negative.  Fact Sheet for Patients:  BloggerCourse.com  Fact Sheet for Healthcare Providers:  SeriousBroker.it  This test is no                          t yet approved or cleared by the [place] FDA and  has been authorized for detection and/or diagnosis of SARS-CoV-2 by FDA under an Emergency Use Authorization (EUA). This EUA will remain  in effect (meaning this test can be used) for the duration of the COVID-19 declaration under Section 564(b)(1) of the Act, 21 U.S.C.section 360bbb-3(b)(1), unless the authorization is terminated  or revoked sooner.       Influenza A by PCR 02/11/2021 NEGATIVE  NEGATIVE Final   Influenza B by PCR 02/11/2021 NEGATIVE  NEGATIVE Final   Comment: (NOTE) The Xpert Xpress SARS-CoV-2/FLU/RSV plus assay is intended as an aid in the diagnosis of influenza from Nasopharyngeal swab specimens and should not be used as a sole basis for treatment. Nasal washings and aspirates are unacceptable for Xpert Xpress SARS-CoV-2/FLU/RSV testing.  Fact Sheet for Patients: BloggerCourse.com  Fact Sheet for Healthcare Providers: SeriousBroker.it  This test is not yet approved or cleared by the [place] FDA and has been authorized for detection and/or diagnosis of SARS-CoV-2 by FDA under an Emergency Use Authorization (EUA). This EUA will remain in effect (meaning this test can be used) for the duration of the COVID-19 declaration under Section 564(b)(1) of the Act, 21 U.S.C. section 360bbb-3(b)(1), unless the authorization is terminated or revoked.  Performed at [facility], [address] [place], [place] [address]   Admission on 02/05/2021, Discharged on 02/05/2021  Component Date Value Ref Range Status   SARS Coronavirus 2 by RT PCR 02/05/2021 NEGATIVE  NEGATIVE Final   Comment:  (NOTE) SARS-CoV-2 target nucleic acids are NOT DETECTED.  The SARS-CoV-2 RNA is generally detectable in upper respiratory specimens during the acute phase of infection. The lowest concentration of SARS-CoV-2 viral copies this assay can detect is 138 copies/mL. A negative result does not preclude SARS-Cov-2 infection and should not be used as the sole basis for treatment or other patient management decisions. A negative result may occur with  improper specimen collection/handling, submission of specimen other than nasopharyngeal swab, presence of viral mutation(s) within the areas targeted by this assay, and inadequate number of viral copies(<138 copies/mL). A negative result must be combined with clinical observations, patient history, and epidemiological information. The expected result is Negative.  Fact Sheet for Patients:  BloggerCourse.com  Fact Sheet for Healthcare Providers:  SeriousBroker.it  This test is no                          t yet approved or cleared by the [place] FDA and  has been authorized for detection and/or diagnosis of SARS-CoV-2 by FDA under an Emergency Use Authorization (EUA). This EUA will remain  in effect (meaning this test can be used) for the duration of the COVID-19 declaration under Section 564(b)(1) of the Act, 21 U.S.C.section 360bbb-3(b)(1), unless the authorization is terminated  or revoked sooner.       Influenza A by PCR 02/05/2021 NEGATIVE  NEGATIVE Final   Influenza B by PCR 02/05/2021 NEGATIVE  NEGATIVE Final   Comment: (NOTE) The Xpert Xpress SARS-CoV-2/FLU/RSV plus assay is intended as an aid in the diagnosis of influenza from Nasopharyngeal swab specimens and should not be used as a sole basis for treatment.  Nasal washings and aspirates are unacceptable for Xpert Xpress SARS-CoV-2/FLU/RSV testing.  Fact Sheet for Patients: BloggerCourse.com  Fact  Sheet for Healthcare Providers: SeriousBroker.it  This test is not yet approved or cleared by the [place] FDA and has been authorized for detection and/or diagnosis of SARS-CoV-2 by FDA under an Emergency Use Authorization (EUA). This EUA will remain in effect (meaning this test can be used) for the duration of the COVID-19 declaration under Section 564(b)(1) of the Act, 21 U.S.C. section 360bbb-3(b)(1), unless the authorization is terminated or revoked.  Performed at [facility], [address], [place], [place] [address]    SARS Coronavirus 2 Ag 02/05/2021 Negative  Negative Preliminary   WBC 02/05/2021 9.2  4.0 - 10.5 K/uL Final   RBC 02/05/2021 5.05  4.22 - 5.81 MIL/uL Final   Hemoglobin 02/05/2021 14.4  13.0 - 17.0 g/dL Final   HCT 60/45/4098 43.1  39.0 - 52.0 % Final   MCV 02/05/2021 85.3  80.0 - 100.0 fL Final   MCH 02/05/2021 28.5  26.0 - 34.0 pg Final   MCHC 02/05/2021 33.4  30.0 - 36.0 g/dL Final   RDW 11/91/4782 14.0  11.5 - 15.5 % Final   Platelets 02/05/2021 346  150 - 400 K/uL Final   nRBC 02/05/2021 0.0  0.0 - 0.2 % Final   Neutrophils Relative % 02/05/2021 59  % Final   Neutro Abs 02/05/2021 5.3  1.7 - 7.7 K/uL Final   Lymphocytes Relative 02/05/2021 29  % Final   Lymphs Abs 02/05/2021 2.7  0.7 - 4.0 K/uL Final   Monocytes Relative 02/05/2021 7  % Final   Monocytes Absolute 02/05/2021 0.7  0.1 - 1.0 K/uL Final   Eosinophils Relative 02/05/2021 5  % Final   Eosinophils Absolute 02/05/2021 0.5  0.0 - 0.5 K/uL Final   Basophils Relative 02/05/2021 0  % Final   Basophils Absolute 02/05/2021 0.0  0.0 - 0.1 K/uL Final   Immature Granulocytes 02/05/2021 0  % Final   Abs Immature Granulocytes 02/05/2021 0.03  0.00 - 0.07 K/uL Final   Performed at [facility], [address] [place], [place] [address]   Sodium 02/05/2021 138  135 - 145 mmol/L Final   Potassium 02/05/2021 4.0  3.5 - 5.1 mmol/L Final   Chloride 02/05/2021 102   98 - 111 mmol/L Final   CO2 02/05/2021 25  22 - 32 mmol/L Final   Glucose, Bld 02/05/2021 88  70 - 99 mg/dL Final   Glucose reference range applies only to samples taken after fasting for at least 8 hours.   BUN 02/05/2021 15  6 - 20 mg/dL Final   Creatinine, Ser 02/05/2021 0.76  0.61 - 1.24 mg/dL Final   Calcium 30/86/5784 9.8  8.9 - 10.3 mg/dL Final   Total Protein 69/62/9528 7.5  6.5 - 8.1 g/dL Final   Albumin 41/32/4401 4.3  3.5 - 5.0 g/dL Final   AST 02/72/5366 21  15 - 41 U/L Final   ALT 02/05/2021 16  0 - 44 U/L Final   Alkaline Phosphatase 02/05/2021 76  38 - 126 U/L Final   Total Bilirubin 02/05/2021 1.0  0.3 - 1.2 mg/dL Final   GFR, Estimated 02/05/2021 >60  >60 mL/min Final   Comment: (NOTE) Calculated using the CKD-EPI Creatinine Equation (2021)    Anion gap 02/05/2021 11  5 - 15 Final   Performed at [facility], [address] [place], [place] [address]   Alcohol,  Ethyl (B) 02/05/2021 <10  <10 mg/dL Final   Comment: (NOTE) Lowest detectable limit for serum alcohol is 10 mg/dL.  For medical purposes only. Performed at [facility], [address], [place], [place] [address]    POC Amphetamine UR 02/05/2021 None Detected  NONE DETECTED (Cut Off Level 1000 ng/mL) Preliminary   POC Secobarbital (BAR) 02/05/2021 None Detected  NONE DETECTED (Cut Off Level 300 ng/mL) Preliminary   POC Buprenorphine (BUP) 02/05/2021 None Detected  NONE DETECTED (Cut Off Level 10 ng/mL) Preliminary   POC Oxazepam (BZO) 02/05/2021 None Detected  NONE DETECTED (Cut Off Level 300 ng/mL) Preliminary   POC Cocaine UR 02/05/2021 None Detected  NONE DETECTED (Cut Off Level 300 ng/mL) Preliminary   POC Methamphetamine UR 02/05/2021 None Detected  NONE DETECTED (Cut Off Level 1000 ng/mL) Preliminary   POC Morphine 02/05/2021 None Detected  NONE DETECTED (Cut Off Level 300 ng/mL) Preliminary   POC Oxycodone UR 02/05/2021 None Detected  NONE DETECTED (Cut Off Level 100 ng/mL) Preliminary    POC Methadone UR 02/05/2021 None Detected  NONE DETECTED (Cut Off Level 300 ng/mL) Preliminary   POC Marijuana UR 02/05/2021 None Detected  NONE DETECTED (Cut Off Level 50 ng/mL) Preliminary   TSH 02/05/2021 5.750 (H)  0.350 - 4.500 uIU/mL Final   Comment: Performed by a 3rd Generation assay with a functional sensitivity of <=0.01 uIU/mL. Performed at [facility], [address] [place], [place] [address]    SARSCOV2ONAVIRUS 2 AG 02/05/2021 NEGATIVE  NEGATIVE Final   Comment: (NOTE) SARS-CoV-2 antigen NOT DETECTED.   Negative results are presumptive.  Negative results do not preclude SARS-CoV-2 infection and should not be used as the sole basis for treatment or other patient management decisions, including infection  control decisions, particularly in the presence of clinical signs and  symptoms consistent with COVID-19, or in those who have been in contact with the virus.  Negative results must be combined with clinical observations, patient history, and epidemiological information. The expected result is Negative.  Fact Sheet for Patients: https://www.jennings-kim.com/  Fact Sheet for Healthcare Providers: https://alexander-rogers.biz/  This test is not yet approved or cleared by the [place] FDA and  has been authorized for detection and/or diagnosis of SARS-CoV-2 by FDA under an Emergency Use Authorization (EUA).  This EUA will remain in effect (meaning this test can be used) for the duration of  the COV                          ID-19 declaration under Section 564(b)(1) of the Act, 21 U.S.C. section 360bbb-3(b)(1), unless the authorization is terminated or revoked sooner.     Free T4 02/05/2021 0.80  0.61 - 1.12 ng/dL Final   Comment: (NOTE) Biotin ingestion may interfere with free T4 tests. If the results are inconsistent with the TSH level, previous test results, or the clinical presentation, then consider biotin interference. If  needed, order repeat testing after stopping biotin. Performed at [facility], [address], [place], [place] [address]    T3, Free 02/05/2021 3.8  2.0 - 4.4 pg/mL Final   Comment: (NOTE) Performed At: [facility] [address] [place], [place] [id number] Jolene Schimke MD [id number]   Admission on 02/04/2021, Discharged on 02/04/2021  Component Date Value Ref Range Status   SARS Coronavirus 2 by RT PCR 02/04/2021 NEGATIVE  NEGATIVE Final   Comment: (NOTE) SARS-CoV-2 target nucleic acids are NOT DETECTED.  The  SARS-CoV-2 RNA is generally detectable in upper respiratory specimens during the acute phase of infection. The lowest concentration of SARS-CoV-2 viral copies this assay can detect is 138 copies/mL. A negative result does not preclude SARS-Cov-2 infection and should not be used as the sole basis for treatment or other patient management decisions. A negative result may occur with  improper specimen collection/handling, submission of specimen other than nasopharyngeal swab, presence of viral mutation(s) within the areas targeted by this assay, and inadequate number of viral copies(<138 copies/mL). A negative result must be combined with clinical observations, patient history, and epidemiological information. The expected result is Negative.  Fact Sheet for Patients:  BloggerCourse.com  Fact Sheet for Healthcare Providers:  SeriousBroker.it  This test is no                          t yet approved or cleared by the [place] FDA and  has been authorized for detection and/or diagnosis of SARS-CoV-2 by FDA under an Emergency Use Authorization (EUA). This EUA will remain  in effect (meaning this test can be used) for the duration of the COVID-19 declaration under Section 564(b)(1) of the Act, 21 U.S.C.section 360bbb-3(b)(1), unless the authorization is terminated  or revoked sooner.        Influenza A by PCR 02/04/2021 NEGATIVE  NEGATIVE Final   Influenza B by PCR 02/04/2021 NEGATIVE  NEGATIVE Final   Comment: (NOTE) The Xpert Xpress SARS-CoV-2/FLU/RSV plus assay is intended as an aid in the diagnosis of influenza from Nasopharyngeal swab specimens and should not be used as a sole basis for treatment. Nasal washings and aspirates are unacceptable for Xpert Xpress SARS-CoV-2/FLU/RSV testing.  Fact Sheet for Patients: BloggerCourse.com  Fact Sheet for Healthcare Providers: SeriousBroker.it  This test is not yet approved or cleared by the [place] FDA and has been authorized for detection and/or diagnosis of SARS-CoV-2 by FDA under an Emergency Use Authorization (EUA). This EUA will remain in effect (meaning this test can be used) for the duration of the COVID-19 declaration under Section 564(b)(1) of the Act, 21 U.S.C. section 360bbb-3(b)(1), unless the authorization is terminated or revoked.  Performed at [facility], [address], [place], [place] [address]   Admission on 01/16/2021, Discharged on 01/24/2021  Component Date Value Ref Range Status   Cholesterol 01/17/2021 94  0 - 200 mg/dL Final   Triglycerides 40/34/7425 100  <150 mg/dL Final   HDL 95/63/8756 28 (L)  >40 mg/dL Final   Total CHOL/HDL Ratio 01/17/2021 3.4  RATIO Final   VLDL 01/17/2021 20  0 - 40 mg/dL Final   LDL Cholesterol 01/17/2021 46  0 - 99 mg/dL Final   Comment:        Total Cholesterol/HDL:CHD Risk Coronary Heart Disease Risk Table                     Men   Women  1/2 Average Risk   3.4   3.3  Average Risk       5.0   4.4  2 X Average Risk   9.6   7.1  3 X Average Risk  23.4   11.0        Use the calculated Patient Ratio above and the CHD Risk Table to determine the patient's CHD Risk.        ATP III CLASSIFICATION (LDL):  <100     mg/dL   Optimal  433-295  mg/dL  Near or Above                    Optimal  130-159   mg/dL   Borderline  161-096  mg/dL   High  >045     mg/dL   Very High Performed at [facility], [address] [place], [place] [address]    Hgb A1c MFr Bld 01/17/2021 6.0 (H)  4.8 - 5.6 % Final   Comment: (NOTE)         Prediabetes: 5.7 - 6.4         Diabetes: >6.4         Glycemic control for adults with diabetes: <7.0    Mean Plasma Glucose 01/17/2021 126  mg/dL Final   Comment: (NOTE) Performed At: [facility] [address] [place], [place] [id number] Jolene Schimke MD AO:[id number]   Admission on 01/16/2021, Discharged on 01/16/2021  Component Date Value Ref Range Status   Sodium 01/16/2021 134 (L)  135 - 145 mmol/L Final   Potassium 01/16/2021 3.8  3.5 - 5.1 mmol/L Final   Chloride 01/16/2021 96 (L)  98 - 111 mmol/L Final   CO2 01/16/2021 27  22 - 32 mmol/L Final   Glucose, Bld 01/16/2021 79  70 - 99 mg/dL Final   Glucose reference range applies only to samples taken after fasting for at least 8 hours.   BUN 01/16/2021 8  6 - 20 mg/dL Final   Creatinine, Ser 01/16/2021 0.83  0.61 - 1.24 mg/dL Final   Calcium 96/29/5284 9.3  8.9 - 10.3 mg/dL Final   Total Protein 13/24/4010 7.6  6.5 - 8.1 g/dL Final   Albumin 27/25/3664 4.0  3.5 - 5.0 g/dL Final   AST 40/34/7425 22  15 - 41 U/L Final   ALT 01/16/2021 18  0 - 44 U/L Final   Alkaline Phosphatase 01/16/2021 58  38 - 126 U/L Final   Total Bilirubin 01/16/2021 0.8  0.3 - 1.2 mg/dL Final   GFR, Estimated 01/16/2021 >60  >60 mL/min Final   Comment: (NOTE) Calculated using the CKD-EPI Creatinine Equation (2021)    Anion gap 01/16/2021 11  5 - 15 Final   Performed at [facility], [address] [place], [place] [address]   Alcohol, Ethyl (B) 01/16/2021 <10  <10 mg/dL Final   Comment: (NOTE) Lowest detectable limit for serum alcohol is 10 mg/dL.  For medical purposes only. Performed at [facility], [address] [place], [place] [address]    Salicylate Lvl 01/16/2021 <7.0 (L)   7.0 - 30.0 mg/dL Final   Performed at [facility], [address] [place], [place] [address]   Acetaminophen (Tylenol), Serum 01/16/2021 <10 (L)  10 - 30 ug/mL Final   Comment: (NOTE) Therapeutic concentrations vary significantly. A range of 10-30 ug/mL  may be an effective concentration for many patients. However, some  are best treated at concentrations outside of this range. Acetaminophen concentrations >150 ug/mL at 4 hours after ingestion  and >50 ug/mL at 12 hours after ingestion are often associated with  toxic reactions.  Performed at [facility], [address] [place], [place] [address]    WBC 01/16/2021 8.8  4.0 - 10.5 K/uL Final   RBC 01/16/2021 4.89  4.22 - 5.81 MIL/uL Final   Hemoglobin 01/16/2021 14.4  13.0 - 17.0 g/dL Final   HCT 60/63/0160 41.7  39.0 - 52.0 % Final   MCV 01/16/2021 85.3  80.0 - 100.0 fL Final  MCH 01/16/2021 29.4  26.0 - 34.0 pg Final   MCHC 01/16/2021 34.5  30.0 - 36.0 g/dL Final   RDW 40/98/1191 13.5  11.5 - 15.5 % Final   Platelets 01/16/2021 402 (H)  150 - 400 K/uL Final   nRBC 01/16/2021 0.0  0.0 - 0.2 % Final   Performed at [facility], [address] [place], [place] [address]   Tricyclic, Ur Screen 01/16/2021 NONE DETECTED  NONE DETECTED Final   Amphetamines, Ur Screen 01/16/2021 POSITIVE (A)  NONE DETECTED Final   MDMA (Ecstasy)Ur Screen 01/16/2021 NONE DETECTED  NONE DETECTED Final   Cocaine Metabolite,Ur [place] 01/16/2021 NONE DETECTED  NONE DETECTED Final   Opiate, Ur Screen 01/16/2021 NONE DETECTED  NONE DETECTED Final   Phencyclidine (PCP) Ur S 01/16/2021 NONE DETECTED  NONE DETECTED Final   Cannabinoid 50 Ng, Ur  01/16/2021 NONE DETECTED  NONE DETECTED Final   Barbiturates, Ur Screen 01/16/2021 NONE DETECTED  NONE DETECTED Final   Benzodiazepine, Ur Scrn 01/16/2021 NONE DETECTED  NONE DETECTED Final   Methadone Scn, Ur 01/16/2021 NONE DETECTED  NONE DETECTED Final   Comment: (NOTE) Tricyclics +  metabolites, urine    Cutoff 1000 ng/mL Amphetamines + metabolites, urine  Cutoff 1000 ng/mL MDMA (Ecstasy), urine              Cutoff 500 ng/mL Cocaine Metabolite, urine          Cutoff 300 ng/mL Opiate + metabolites, urine        Cutoff 300 ng/mL Phencyclidine (PCP), urine         Cutoff 25 ng/mL Cannabinoid, urine                 Cutoff 50 ng/mL Barbiturates + metabolites, urine  Cutoff 200 ng/mL Benzodiazepine, urine              Cutoff 200 ng/mL Methadone, urine                   Cutoff 300 ng/mL  The urine drug screen provides only a preliminary, unconfirmed analytical test result and should not be used for non-medical purposes. Clinical consideration and professional judgment should be applied to any positive drug screen result due to possible interfering substances. A more specific alternate chemical method must be used in order to obtain a confirmed analytical result. Gas chromatography / mass spectrometry (GC/MS) is the preferred confirm                          atory method. Performed at [facility], [address] [place], [place] [address]    SARS Coronavirus 2 by RT PCR 01/16/2021 NEGATIVE  NEGATIVE Final   Comment: (NOTE) SARS-CoV-2 target nucleic acids are NOT DETECTED.  The SARS-CoV-2 RNA is generally detectable in upper respiratory specimens during the acute phase of infection. The lowest concentration of SARS-CoV-2 viral copies this assay can detect is 138 copies/mL. A negative result does not preclude SARS-Cov-2 infection and should not be used as the sole basis for treatment or other patient management decisions. A negative result may occur with  improper specimen collection/handling, submission of specimen other than nasopharyngeal swab, presence of viral mutation(s) within the areas targeted by this assay, and inadequate number of viral copies(<138 copies/mL). A negative result must be combined with clinical observations, patient history, and  epidemiological information. The expected result is Negative.  Fact Sheet for Patients:  BloggerCourse.com  Fact Sheet for Healthcare Providers:  SeriousBroker.it  This test is no                          t yet approved or cleared by the [place] and  has been authorized for detection and/or diagnosis of SARS-CoV-2 by FDA under an Emergency Use Authorization (EUA). This EUA will remain  in effect (meaning this test can be used) for the duration of the COVID-19 declaration under Section 564(b)(1) of the Act, 21 U.S.C.section 360bbb-3(b)(1), unless the authorization is terminated  or revoked sooner.       Influenza A by PCR 01/16/2021 NEGATIVE  NEGATIVE Final   Influenza B by PCR 01/16/2021 NEGATIVE  NEGATIVE Final   Comment: (NOTE) The Xpert Xpress SARS-CoV-2/FLU/RSV plus assay is intended as an aid in the diagnosis of influenza from Nasopharyngeal swab specimens and should not be used as a sole basis for treatment. Nasal washings and aspirates are unacceptable for Xpert Xpress SARS-CoV-2/FLU/RSV testing.  Fact Sheet for Patients: BloggerCourse.com  Fact Sheet for Healthcare Providers: SeriousBroker.it  This test is not yet approved or cleared by the [place] FDA and has been authorized for detection and/or diagnosis of SARS-CoV-2 by FDA under an Emergency Use Authorization (EUA). This EUA will remain in effect (meaning this test can be used) for the duration of the COVID-19 declaration under Section 564(b)(1) of the Act, 21 U.S.C. section 360bbb-3(b)(1), unless the authorization is terminated or revoked.  Performed at [facility], [address] [place], [place] [address]   There may be more visits with results that are not included.    Blood Alcohol level:  Lab Results  Component Value Date   ETH <10 04/14/2021   ETH <10 02/22/2021     Metabolic Disorder Labs: Lab Results  Component Value Date   HGBA1C 6.0 (H) 01/17/2021   MPG 126 01/17/2021   MPG 126 10/08/2020   No results found for: PROLACTIN Lab Results  Component Value Date   CHOL 94 01/17/2021   TRIG 100 01/17/2021   HDL 28 (L) 01/17/2021   CHOLHDL 3.4 01/17/2021   VLDL 20 01/17/2021   LDLCALC 46 01/17/2021   LDLCALC 49 10/08/2020    Therapeutic Lab Levels: No results found for: LITHIUM Lab Results  Component Value Date   VALPROATE 62 10/16/2019   No components found for:  CBMZ  Physical Findings   AIMS    Flowsheet Row Admission (Discharged) from 01/16/2021 in [facility]) [facility]) [facility]  AIMS Total Score 0 0 0      AUDIT    Flowsheet Row Admission (Discharged) from 01/16/2021 in [facility]PAT[facility] BE[facility] MEDICINE Admission (Discharged) [facility] Ad[facility] (Discharged[facility] Admission (Discharged) from 10/10/2019 in [facility] INPATIENT BEHAVIORAL MEDICINE Admission (Discharged) from 09/14/2019 in [facility] INPATIENT BEHAVIORAL MEDICINE  Alcohol Use Disorder Identification Test Final Score (AUDIT) 1 34 15 0 0      PHQ2-9    Flowsheet Row ED from 04/14/2021 in [place] [facility]PART[facility] ED from 02/05/2021 in [facility] ED from 01/14/2021 in [place] EM[facility]GENCY DEPARTMENT ED from 11/10/2020 in [facility] EMERGENCY DEPARTMENT ED from 10/28/2020 in [facility] EMERGENCY DEPARTMENT  PHQ-2 Total Score 2 4 2 4 3   PHQ-9 Total Score 16 9 6  8  10      Flowsheet Row ED from 04/15/2021 in [facility] ED from 04/14/2021 in [place] EMERGENCY DEPARTMENT ED from 04/05/2021 in [facility]   C-SSRS RISK CATEGORY Low Risk High Risk No Risk        Musculoskeletal  Strength & Muscle Tone: within normal limits Gait & Station: normal Patient leans: N/A  Psychiatric Specialty Exam  Presentation  General Appearance: Appropriate for Environment; Casual  Eye Contact:Good  Speech:Clear and Coherent; Normal Rate  Speech Volume:Normal  Handedness:Right   Mood and Affect  Mood:Depressed  Affect:Appropriate; Congruent   Thought Process  Thought Processes:Coherent; Goal Directed; Linear  Descriptions of Associations:Intact  Orientation:Full (Time, Place and Person)  Thought Content:WDL; Logical  Diagnosis of Schizophrenia or Schizoaffective disorder in past: No    Hallucinations:Hallucinations: None  Ideas of Reference:None  Suicidal Thoughts:Suicidal Thoughts: Yes, Passive  SI Passive Intent and/or Plan: Without Intent; Without Plan  Homicidal Thoughts:Homicidal Thoughts: No   Sensorium  Memory:Immediate Good; Recent Good; Remote Good  Judgment:Intact  Insight:Fair   Executive Functions  Concentration:Fair  Attention Span:Good  Recall:Good  Fund of Knowledge:Fair  Language:Good   Psychomotor Activity  Psychomotor Activity:Psychomotor Activity: Normal   Assets  Assets:Communication Skills; Desire for Improvement; Resilience   Sleep  Sleep:Sleep: Fair   Nutritional Assessment (For OBS and [facility]BC admissions only) Has the patient had a weight loss or gain of 10 pounds or more in the last 3 months?: No Has the patient had a decrease in food intake/or appetite?: No Does the patient have dental problems?: No Does the patient have eating habits or behaviors that may be indicators of an eating disorder including binging or inducing vomiting?: No Has the patient recently lost weight without trying?: 0 Has the patient been eating poorly because of a decreased appetite?: 0 Malnutrition Screening Tool Score: 0    Physical Exam  Physical  Exam Constitutional:      Appearance: Normal appearance. He is normal weight.  HENT:     Head: Normocephalic and atraumatic.  Eyes:     Extraocular Movements: Extraocular movements intact.  Pulmonary:     Effort: Pulmonary effort is normal.  Neurological:     General: No focal deficit present.     Mental Status: He is alert and oriented to person, place, and time.  Psychiatric:        Attention and Perception: Attention and perception normal.        Speech: Speech normal.        Behavior: Behavior normal. Behavior is cooperative.        Thought Content: Thought content normal.   Review of Systems  Constitutional:  Negative for chills and fever.  HENT:  Negative for hearing loss.   Eyes:  Negative for discharge and redness.  Respiratory:  Negative for cough.   Cardiovascular:  Negative for chest pain.  Gastrointestinal:  Negative for abdominal pain.  Musculoskeletal:  Negative for myalgias.  Neurological:  Negative for headaches.  Psychiatric/Behavioral:  Positive for depression, substance abuse and suicidal ideas.        Passive SI   Blood pressure 115/62, pulse 80, temperature (!) 97.1 F (36.2 C), temperature source Temporal, resp. rate 18, SpO2 100 %. There is no height or weight on file to calculate BMI.  Treatment Plan Summary: 30 yo male with history of substance abuse, MDD, substance induced mood disorder, substance induced psychotic disorder who presented initially to [facility] ED on 12/19 with SI with  a plan to overdose on pills. Patient was unable to contract for safety and was transferred to the [facility] on 12/20 for further treatment and then 12/21 to the [facility] for crisis stabilization. UDS +amphetamine.  Labs generally unremarkable and he was medically cleared in the ED prior to tranfer to the [facility]. Patient reported relapsing on methamphetamine a couple days ago and voluntarily leaving [place] [facility] as he knew he would be asked to leave;he reports being homeless for ~1 week.    Patient unable to identify any stressors leading to relapse or SI.  Per collateral information obtained by Pearletha Furl NP while in the  [facility] ED-patient was kicked out of [place] [facility] 2 weeks ago for relapsing and has been spending his paycheck on drugs; she reported that Jazir is not welcome home due to his drug use at this time.  Patient noted to provide inconsistent history while in the [facility]; he reports somewhat different history regarding timelines of relapse, leaving [place] [facility], etc while at the [facility]. Suspicion that there is some element of  secondary gain due to currently being undomiciled.   Patient continues to report passive SI-no plan or intent; unable to contract for safety outside the hospital.  He is tolerating Zoloft well without side effects.  He denies HI/AVH.  Patient expresses interest in residential rehab after recent relapse on methamphetamine.  Social work was consulted for assistance-due to the holidays there is limited availability of residential substance use treatment facilities.  Recommended that patient call rehab facilities in order to facilitate possible acceptance admission.  He remains appropriate for continued treatment at the [facility] for crisis stabilization.      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 Has recently been using 1 gram/day -patient expresses interest in substance use treatment-SW consulted for assistance.    Congestion -continue Sodium chloride 0.65% nasal spray (1 spray each nare) as needed ordered for congestion   Dispo: ongoing. SW assisting-likely residential rehab   Estella Husk, MD 04/17/2021 12:14 PM

## 2021-04-17 NOTE — ED Notes (Signed)
Pt asleep in bed. Respirations even and unlabored. Will continue to monitor for safety. ?

## 2021-04-17 NOTE — ED Notes (Signed)
Pt in room.  Breathing is unlabored and even.  No pain or discomfort noted/ voiced. Will continue to monitor for safety.

## 2021-04-17 NOTE — Progress Notes (Signed)
Pt is resting quietly. No distress noted. Staff will monitor for pt's safety. °

## 2021-04-17 NOTE — ED Notes (Signed)
Pt up eating breakfast.  Encouraged pt to call [facility], pt stated he placed a call this morning but no one answered.  Will continue to encourage pt to reach out to [facility] per SW.

## 2021-04-17 NOTE — ED Notes (Signed)
Pt just got off phone with [facility].  He stated he finished his pre-screening and that [facility] should be reaching out tomorrow about his screening.  Will continue to monitor for safety.

## 2021-04-17 NOTE — ED Notes (Signed)
PT in dayroom watching television and interacting with another pt in dayroom.pt calm and cooperative. No c/o pain or distress.A & O x 4. Will continue to monitor for safety

## 2021-04-17 NOTE — Clinical Social Work Psych Note (Signed)
CSW Update Note  Nathan Koch reports feeling "okay" this morning. He shared that he did not sleep well and only got "a few hours". Nathan Koch shared that he feels "better" than he did yesterday.   Nathan Koch shares that he continues to have interest in discharging to a residential treatment facility following his discharge from the [facility], however he did not contact [facility] to complete his phone screening in time yesterday, as instructed.   CSW shared with Nathan Koch, that at this time there are no beds available, however he must complete his phone screening with [facility] to be placed on the wait list. Nathan Koch shared that he will attempt to call today to complete his phone screening.   Nathan Koch reports he does not know any alternative living arrangements at this time., if he were to discharge with placement. CSW ensured Nathan Koch that providers will continue to follow up and seek placement as beds become available.   Nathan Koch expressed understanding.   Nathan Koch has been referred to the following facilities for review:  [facility] ([facility]  CSW will continue to follow.    Nathan Koch, MSW, LCSW Clinical Child psychotherapist (Facility Based Crisis) [facility]

## 2021-04-18 DIAGNOSIS — F17210 Nicotine dependence, cigarettes, uncomplicated: Secondary | ICD-10-CM | POA: Diagnosis not present

## 2021-04-18 DIAGNOSIS — F332 Major depressive disorder, recurrent severe without psychotic features: Secondary | ICD-10-CM | POA: Diagnosis not present

## 2021-04-18 DIAGNOSIS — F1994 Other psychoactive substance use, unspecified with psychoactive substance-induced mood disorder: Secondary | ICD-10-CM | POA: Diagnosis not present

## 2021-04-18 DIAGNOSIS — R45851 Suicidal ideations: Secondary | ICD-10-CM | POA: Diagnosis not present

## 2021-04-18 MED ORDER — QUETIAPINE FUMARATE 25 MG PO TABS
25.0000 mg | ORAL_TABLET | Freq: Every day | ORAL | Status: DC
  Administered 2021-04-18 – 2021-04-19 (×2): 25 mg via ORAL
  Filled 2021-04-18 (×2): qty 1

## 2021-04-18 NOTE — ED Notes (Signed)
Pt in the dining room interacting with other patients. No distress observed. Safety maintained and will continue to monitor.

## 2021-04-18 NOTE — ED Notes (Signed)
Pt resting on bed in his room. No s&s of distress. Safety maintained and will continue to monitor.

## 2021-04-18 NOTE — ED Notes (Signed)
Pt sleeping@this time. Breathing even and unlabored. Will continue to monitor or safety 

## 2021-04-18 NOTE — ED Notes (Signed)
He came to group

## 2021-04-18 NOTE — ED Notes (Signed)
Pt laying in his bed with his eyes open calm and cooperative. no c/o pain or distress. Will continue to monitor for safety

## 2021-04-18 NOTE — ED Provider Notes (Signed)
Behavioral Health Progress Note  Date and Time: 04/18/2021 10:59 AM Name: Nathan Koch MRN:  [id number]  Subjective:   30 yo male with history of substance abuse, MDD, substance induced mood disorder, substance induced psychotic disorder, PTSD, bipolar disorder who presented initially to [facility] ED on 12/19 with SI with a plan to overdose on pills. Patient was unable to contract for safety and was transferred to the [facility] on 12/20 for further treatment and then 12/21 to the [facility] for crisis stabilization. UDS +amphetamine.  Patient seen and chart reviewed.  He has been medication compliant and has been appropriate with staff and peers on the unit.  He has been attending groups.  Prior to interview, patient is seen interacting with staff and peers appropriately; affect appears much brighter.  Patient interviewed in his room this morning -he states his mood is "okay".  He reports some passive SI but denies HI/AVH.  Patient states that he spoke to an [place] staff member and has been accepted on Tuesday, December 27 for admission.  This was confirmed by staff member covering for social work today.  Patient reports sleeping poorly and states that he previously took Seroquel which consisted with sleep and mood.  Patient states that he was previously on 150 mg when he was hospitalized at [facility] is confirmed per chart review.  Discussed restarting this medication at lower dose-patient is amenable. Patient states that the most helpful group that he has attended so far has been AA.  Patient denies any physical complaints today.  Patient states that "I just want to get into treatment".   Diagnosis:  Final diagnoses:  Severe episode of recurrent major depressive disorder, without psychotic features ([name])  Methamphetamine use disorder, moderate ([name])  Amphetamine-induced mood disorder ([name])  Passive suicidal ideations  Homelessness    Total Time spent with patient: 15 minutes  Past Psychiatric History:  substance abuse, MDD, substance induced mood disorder, substance induced psychotic disorder, PTSD, bipolar disorder  Past Medical History:  Past Medical History:  Diagnosis Date   Anxiety    Anxiety disorder    Asthma    Bipolar 1 disorder ([name])    Depression    Methamphetamine abuse ([name])    PTSD (post-traumatic stress disorder)     Past Surgical History:  Procedure Laterality Date   CLOSED REDUCTION MANDIBLE N/A 07/02/2018   Procedure: CLOSED REDUCTION MANDIBULAR;  Surgeon: Vernie Murders, MD;  Location: [facility] ORS;  Service: ENT;  Laterality: N/A;   Family History:  Family History  Problem Relation Age of Onset   Asthma Mother    Cancer Mother        breast cancer   Ulcers Mother    Cancer Father        esophogeal cancer   Hypertension Father    Asthma Brother    Family Psychiatric  History: denied Social History:  Social History   Substance and Sexual Activity  Alcohol Use Not Currently   Alcohol/week: 13.0 standard drinks   Types: 6 Cans of beer, 7 Standard drinks or equivalent per week   Comment: previously heavy drinker 2016. most recent 4 beer/ day drinker and none since 01-11-2020     Social History   Substance and Sexual Activity  Drug Use Yes   Types: Methamphetamines   Comment: states he uses 1 g every other day    Social History   Socioeconomic History   Marital status: Single    Spouse name: Not on file   Number of  children: 1   Years of education: 10   Highest education level: 10th grade  Occupational History   Not on file  Tobacco Use   Smoking status: Every Day    Packs/day: 0.50    Years: 8.00    Pack years: 4.00    Types: Cigarettes   Smokeless tobacco: Never  Vaping Use   Vaping Use: Every day   Substances: Nicotine, Flavoring  Substance and Sexual Activity   Alcohol use: Not Currently    Alcohol/week: 13.0 standard drinks    Types: 6 Cans of beer, 7 Standard drinks or equivalent per week    Comment: previously heavy drinker 2016.  most recent 4 beer/ day drinker and none since 01-11-2020   Drug use: Yes    Types: Methamphetamines    Comment: states he uses 1 g every other day   Sexual activity: Not on file  Other Topics Concern   Not on file  Social History Narrative   Not on file   Social Determinants of Health   Financial Resource Strain: Not on file  Food Insecurity: Not on file  Transportation Needs: Not on file  Physical Activity: Not on file  Stress: Not on file  Social Connections: Not on file   SDOH:  SDOH Screenings   Alcohol Screen: Low Risk    Last Alcohol Screening Score (AUDIT): 1  Depression (PHQ2-9): Medium Risk   PHQ-2 Score: 16  Financial Resource Strain: Not on file  Food Insecurity: Not on file  Housing: Not on file  Physical Activity: Not on file  Social Connections: Not on file  Stress: Not on file  Tobacco Use: High Risk   Smoking Tobacco Use: Every Day   Smokeless Tobacco Use: Never   Passive Exposure: Not on file  Transportation Needs: Not on file   Additional Social History:                         Sleep: Good  Appetite:  Good  Current Medications:  Current Facility-Administered Medications  Medication Dose Route Frequency Provider Last Rate Last Admin   acetaminophen (TYLENOL) tablet 650 mg  650 mg Oral Q6H PRN Rankin, Shuvon B, NP       albuterol (VENTOLIN HFA) 108 (90 Base) MCG/ACT inhaler 2 puff  2 puff Inhalation Q4H PRN Ladona Ridgel, Cody W, PA-C       alum & mag hydroxide-simeth (MAALOX/MYLANTA) 200-200-20 MG/5ML suspension 30 mL  30 mL Oral Q4H PRN Rankin, Shuvon B, NP       hydrOXYzine (ATARAX) tablet 25 mg  25 mg Oral TID PRN Rankin, Shuvon B, NP       magnesium hydroxide (MILK OF MAGNESIA) suspension 30 mL  30 mL Oral Daily PRN Rankin, Shuvon B, NP       sertraline (ZOLOFT) tablet 50 mg  50 mg Oral QHS Ladona Ridgel, Cody W, PA-C   50 mg at 04/17/21 2107   sodium chloride (OCEAN) 0.65 % nasal spray 1 spray  1 spray Each Nare PRN Jaclyn Shaggy, PA-C        traZODone (DESYREL) tablet 50 mg  50 mg Oral QHS PRN Melbourne Abts W, PA-C   50 mg at 04/17/21 2107   Current Outpatient Medications  Medication Sig Dispense Refill   albuterol (VENTOLIN HFA) 108 (90 Base) MCG/ACT inhaler Inhale 2 puffs into the lungs every 4 (four) hours as needed for wheezing or shortness of breath. (Patient not taking: Reported on 02/12/2021) 6.7 g  1   sertraline (ZOLOFT) 50 MG tablet Take 1 tablet (50 mg total) by mouth at bedtime. (Patient not taking: Reported on 04/15/2021) 7 tablet 0    Labs  Lab Results:  Admission on 04/14/2021, Discharged on 04/15/2021  Component Date Value Ref Range Status   SARS Coronavirus 2 by RT PCR 04/14/2021 NEGATIVE  NEGATIVE Final   Comment: (NOTE) SARS-CoV-2 target nucleic acids are NOT DETECTED.  The SARS-CoV-2 RNA is generally detectable in upper respiratory specimens during the acute phase of infection. The lowest concentration of SARS-CoV-2 viral copies this assay can detect is 138 copies/mL. A negative result does not preclude SARS-Cov-2 infection and should not be used as the sole basis for treatment or other patient management decisions. A negative result may occur with  improper specimen collection/handling, submission of specimen other than nasopharyngeal swab, presence of viral mutation(s) within the areas targeted by this assay, and inadequate number of viral copies(<138 copies/mL). A negative result must be combined with clinical observations, patient history, and epidemiological information. The expected result is Negative.  Fact Sheet for Patients:  BloggerCourse.com  Fact Sheet for Healthcare Providers:  SeriousBroker.it  This test is no                          t yet approved or cleared by the [place] FDA and  has been authorized for detection and/or diagnosis of SARS-CoV-2 by FDA under an Emergency Use Authorization (EUA). This EUA will remain  in effect  (meaning this test can be used) for the duration of the COVID-19 declaration under Section 564(b)(1) of the Act, 21 U.S.C.section 360bbb-3(b)(1), unless the authorization is terminated  or revoked sooner.       Influenza A by PCR 04/14/2021 NEGATIVE  NEGATIVE Final   Influenza B by PCR 04/14/2021 NEGATIVE  NEGATIVE Final   Comment: (NOTE) The Xpert Xpress SARS-CoV-2/FLU/RSV plus assay is intended as an aid in the diagnosis of influenza from Nasopharyngeal swab specimens and should not be used as a sole basis for treatment. Nasal washings and aspirates are unacceptable for Xpert Xpress SARS-CoV-2/FLU/RSV testing.  Fact Sheet for Patients: BloggerCourse.com  Fact Sheet for Healthcare Providers: SeriousBroker.it  This test is not yet approved or cleared by the [place] FDA and has been authorized for detection and/or diagnosis of SARS-CoV-2 by FDA under an Emergency Use Authorization (EUA). This EUA will remain in effect (meaning this test can be used) for the duration of the COVID-19 declaration under Section 564(b)(1) of the Act, 21 U.S.C. section 360bbb-3(b)(1), unless the authorization is terminated or revoked.  Performed at [facility], [address] [place], [place] [address]    Sodium 04/14/2021 134 (L)  135 - 145 mmol/L Final   Potassium 04/14/2021 3.6  3.5 - 5.1 mmol/L Final   Chloride 04/14/2021 94 (L)  98 - 111 mmol/L Final   CO2 04/14/2021 28  22 - 32 mmol/L Final   Glucose, Bld 04/14/2021 119 (H)  70 - 99 mg/dL Final   Glucose reference range applies only to samples taken after fasting for at least 8 hours.   BUN 04/14/2021 14  6 - 20 mg/dL Final   Creatinine, Ser 04/14/2021 0.77  0.61 - 1.24 mg/dL Final   Calcium 60/45/4098 9.7  8.9 - 10.3 mg/dL Final   Total Protein 11/91/4782 8.8 (H)  6.5 - 8.1 g/dL Final   Albumin 95/62/1308 4.4  3.5 - 5.0 g/dL Final   AST 65/78/4696 47 (H)  15 - 41 U/L Final   ALT  04/14/2021 46 (H)  0 - 44 U/L Final   Alkaline Phosphatase 04/14/2021 71  38 - 126 U/L Final   Total Bilirubin 04/14/2021 1.0  0.3 - 1.2 mg/dL Final   GFR, Estimated 04/14/2021 >60  >60 mL/min Final   Comment: (NOTE) Calculated using the CKD-EPI Creatinine Equation (2021)    Anion gap 04/14/2021 12  5 - 15 Final   Performed at [facility], [address] [place], [place] [address]   Alcohol, Ethyl (B) 04/14/2021 <10  <10 mg/dL Final   Comment: (NOTE) Lowest detectable limit for serum alcohol is 10 mg/dL.  For medical purposes only. Performed at [facility], [address] [place], [place] [address]    Salicylate Lvl 04/14/2021 <7.0 (L)  7.0 - 30.0 mg/dL Final   Performed at [facility], [address] [place], [place] [address]   Acetaminophen (Tylenol), Serum 04/14/2021 <10 (L)  10 - 30 ug/mL Final   Comment: (NOTE) Therapeutic concentrations vary significantly. A range of 10-30 ug/mL  may be an effective concentration for many patients. However, some  are best treated at concentrations outside of this range. Acetaminophen concentrations >150 ug/mL at 4 hours after ingestion  and >50 ug/mL at 12 hours after ingestion are often associated with  toxic reactions.  Performed at [facility], [address] [place], [place] [address]    WBC 04/14/2021 12.6 (H)  4.0 - 10.5 K/uL Final   RBC 04/14/2021 5.35  4.22 - 5.81 MIL/uL Final   Hemoglobin 04/14/2021 15.7  13.0 - 17.0 g/dL Final   HCT 00/86/7619 46.9  39.0 - 52.0 % Final   MCV 04/14/2021 87.7  80.0 - 100.0 fL Final   MCH 04/14/2021 29.3  26.0 - 34.0 pg Final   MCHC 04/14/2021 33.5  30.0 - 36.0 g/dL Final   RDW 50/93/2671 13.5  11.5 - 15.5 % Final   Platelets 04/14/2021 481 (H)  150 - 400 K/uL Final   nRBC 04/14/2021 0.0  0.0 - 0.2 % Final   Performed at [facility], [address] [place], [place] [address]   Opiates 04/14/2021 NONE DETECTED  NONE DETECTED Final   Cocaine 04/14/2021 NONE DETECTED   NONE DETECTED Final   Benzodiazepines 04/14/2021 NONE DETECTED  NONE DETECTED Final   Amphetamines 04/14/2021 POSITIVE (A)  NONE DETECTED Final   Tetrahydrocannabinol 04/14/2021 NONE DETECTED  NONE DETECTED Final   Barbiturates 04/14/2021 NONE DETECTED  NONE DETECTED Final   Comment: (NOTE) DRUG SCREEN FOR MEDICAL PURPOSES ONLY.  IF CONFIRMATION IS NEEDED FOR ANY PURPOSE, NOTIFY LAB WITHIN 5 DAYS.  LOWEST DETECTABLE LIMITS FOR URINE DRUG SCREEN Drug Class                     Cutoff (ng/mL) Amphetamine and metabolites    1000 Barbiturate and metabolites    200 Benzodiazepine                 200 Tricyclics and metabolites     300 Opiates and metabolites        300 Cocaine and metabolites        300 THC                            50 Performed at [facility], [address], [place], [place] [address]   Admission on 04/05/2021, Discharged on 04/05/2021  Component Date Value Ref  Range Status   Lipase 04/05/2021 24  11 - 51 U/L Final   Performed at [facility], [address], [place], [place] [address]   Sodium 04/05/2021 140  135 - 145 mmol/L Final   Potassium 04/05/2021 3.8  3.5 - 5.1 mmol/L Final   Chloride 04/05/2021 105  98 - 111 mmol/L Final   CO2 04/05/2021 28  22 - 32 mmol/L Final   Glucose, Bld 04/05/2021 88  70 - 99 mg/dL Final   Glucose reference range applies only to samples taken after fasting for at least 8 hours.   BUN 04/05/2021 10  6 - 20 mg/dL Final   Creatinine, Ser 04/05/2021 0.85  0.61 - 1.24 mg/dL Final   Calcium 40/98/1191 9.9  8.9 - 10.3 mg/dL Final   Total Protein 47/82/9562 7.9  6.5 - 8.1 g/dL Final   Albumin 13/11/6576 4.6  3.5 - 5.0 g/dL Final   AST 46/96/2952 13 (L)  15 - 41 U/L Final   ALT 04/05/2021 11  0 - 44 U/L Final   Alkaline Phosphatase 04/05/2021 65  38 - 126 U/L Final   Total Bilirubin 04/05/2021 0.8  0.3 - 1.2 mg/dL Final   GFR, Estimated 04/05/2021 >60  >60 mL/min Final   Comment: (NOTE) Calculated using the CKD-EPI  Creatinine Equation (2021)    Anion gap 04/05/2021 7  5 - 15 Final   Performed at [facility], [address], [place], [place] [address]   WBC 04/05/2021 6.5  4.0 - 10.5 K/uL Final   RBC 04/05/2021 5.25  4.22 - 5.81 MIL/uL Final   Hemoglobin 04/05/2021 14.6  13.0 - 17.0 g/dL Final   HCT 44/04/270 45.2  39.0 - 52.0 % Final   MCV 04/05/2021 86.1  80.0 - 100.0 fL Final   MCH 04/05/2021 27.8  26.0 - 34.0 pg Final   MCHC 04/05/2021 32.3  30.0 - 36.0 g/dL Final   RDW 53/66/4403 13.9  11.5 - 15.5 % Final   Platelets 04/05/2021 324  150 - 400 K/uL Final   nRBC 04/05/2021 0.0  0.0 - 0.2 % Final   Performed at [facility], [address] [place], [place] [address]   SARS Coronavirus 2 by RT PCR 04/05/2021 NEGATIVE  NEGATIVE Final   Comment: (NOTE) SARS-CoV-2 target nucleic acids are NOT DETECTED.  The SARS-CoV-2 RNA is generally detectable in upper respiratory specimens during the acute phase of infection. The lowest concentration of SARS-CoV-2 viral copies this assay can detect is 138 copies/mL. A negative result does not preclude SARS-Cov-2 infection and should not be used as the sole basis for treatment or other patient management decisions. A negative result may occur with  improper specimen collection/handling, submission of specimen other than nasopharyngeal swab, presence of viral mutation(s) within the areas targeted by this assay, and inadequate number of viral copies(<138 copies/mL). A negative result must be combined with clinical observations, patient history, and epidemiological information. The expected result is Negative.  Fact Sheet for Patients:  BloggerCourse.com  Fact Sheet for Healthcare Providers:  SeriousBroker.it  This test is no                          t yet approved or cleared by the [place] FDA and  has been authorized for detection and/or diagnosis of  SARS-CoV-2 by FDA under an Emergency Use Authorization (EUA). This EUA will remain  in effect (meaning this test can be used) for the duration  of the COVID-19 declaration under Section 564(b)(1) of the Act, 21 U.S.C.section 360bbb-3(b)(1), unless the authorization is terminated  or revoked sooner.       Influenza A by PCR 04/05/2021 NEGATIVE  NEGATIVE Final   Influenza B by PCR 04/05/2021 NEGATIVE  NEGATIVE Final   Comment: (NOTE) The Xpert Xpress SARS-CoV-2/FLU/RSV plus assay is intended as an aid in the diagnosis of influenza from Nasopharyngeal swab specimens and should not be used as a sole basis for treatment. Nasal washings and aspirates are unacceptable for Xpert Xpress SARS-CoV-2/FLU/RSV testing.  Fact Sheet for Patients: BloggerCourse.com  Fact Sheet for Healthcare Providers: SeriousBroker.it  This test is not yet approved or cleared by the [place] FDA and has been authorized for detection and/or diagnosis of SARS-CoV-2 by FDA under an Emergency Use Authorization (EUA). This EUA will remain in effect (meaning this test can be used) for the duration of the COVID-19 declaration under Section 564(b)(1) of the Act, 21 U.S.C. section 360bbb-3(b)(1), unless the authorization is terminated or revoked.  Performed at [facility], [address], [place], [place] [address]   Admission on 02/22/2021, Discharged on 02/22/2021  Component Date Value Ref Range Status   Sodium 02/22/2021 133 (L)  135 - 145 mmol/L Final   Potassium 02/22/2021 3.8  3.5 - 5.1 mmol/L Final   Chloride 02/22/2021 93 (L)  98 - 111 mmol/L Final   CO2 02/22/2021 30  22 - 32 mmol/L Final   Glucose, Bld 02/22/2021 116 (H)  70 - 99 mg/dL Final   Glucose reference range applies only to samples taken after fasting for at least 8 hours.   BUN 02/22/2021 26 (H)  6 - 20 mg/dL Final   Creatinine, Ser 02/22/2021 0.95  0.61 - 1.24 mg/dL  Final   Calcium 74/11/1446 9.7  8.9 - 10.3 mg/dL Final   Total Protein 18/56/3149 8.6 (H)  6.5 - 8.1 g/dL Final   Albumin 70/26/3785 4.7  3.5 - 5.0 g/dL Final   AST 88/50/2774 51 (H)  15 - 41 U/L Final   ALT 02/22/2021 34  0 - 44 U/L Final   Alkaline Phosphatase 02/22/2021 70  38 - 126 U/L Final   Total Bilirubin 02/22/2021 2.6 (H)  0.3 - 1.2 mg/dL Final   GFR, Estimated 02/22/2021 >60  >60 mL/min Final   Comment: (NOTE) Calculated using the CKD-EPI Creatinine Equation (2021)    Anion gap 02/22/2021 10  5 - 15 Final   Performed at [facility], [address] [place], [place] [address]   Alcohol, Ethyl (B) 02/22/2021 <10  <10 mg/dL Final   Comment: (NOTE) Lowest detectable limit for serum alcohol is 10 mg/dL.  For medical purposes only. Performed at [facility], [address] [place], [place] [address]    WBC 02/22/2021 11.4 (H)  4.0 - 10.5 K/uL Final   RBC 02/22/2021 5.00  4.22 - 5.81 MIL/uL Final   Hemoglobin 02/22/2021 14.6  13.0 - 17.0 g/dL Final   HCT 09/47/0962 41.4  39.0 - 52.0 % Final   MCV 02/22/2021 82.8  80.0 - 100.0 fL Final   MCH 02/22/2021 29.2  26.0 - 34.0 pg Final   MCHC 02/22/2021 35.3  30.0 - 36.0 g/dL Final   RDW 83/66/2947 13.1  11.5 - 15.5 % Final   Platelets 02/22/2021 399  150 - 400 K/uL Final   nRBC 02/22/2021 0.0  0.0 - 0.2 % Final   Performed at [facility], [address] [place], [place]  [address]   Tricyclic, Ur Screen 02/22/2021 NONE DETECTED  NONE DETECTED Final   Amphetamines, Ur Screen 02/22/2021 POSITIVE (A)  NONE DETECTED Final   MDMA (Ecstasy)Ur Screen 02/22/2021 NONE DETECTED  NONE DETECTED Final   Cocaine Metabolite,Ur [place] 02/22/2021 NONE DETECTED  NONE DETECTED Final   Opiate, Ur Screen 02/22/2021 NONE DETECTED  NONE DETECTED Final   Phencyclidine (PCP) Ur S 02/22/2021 NONE DETECTED  NONE DETECTED Final   Cannabinoid 50 Ng, Ur [place] 02/22/2021 POSITIVE (A)  NONE DETECTED Final   Barbiturates, Ur Screen  02/22/2021 NONE DETECTED  NONE DETECTED Final   Benzodiazepine, Ur Scrn 02/22/2021 NONE DETECTED  NONE DETECTED Final   Methadone Scn, Ur 02/22/2021 NONE DETECTED  NONE DETECTED Final   Comment: (NOTE) Tricyclics + metabolites, urine    Cutoff 1000 ng/mL Amphetamines + metabolites, urine  Cutoff 1000 ng/mL MDMA (Ecstasy), urine              Cutoff 500 ng/mL Cocaine Metabolite, urine          Cutoff 300 ng/mL Opiate + metabolites, urine        Cutoff 300 ng/mL Phencyclidine (PCP), urine         Cutoff 25 ng/mL Cannabinoid, urine                 Cutoff 50 ng/mL Barbiturates + metabolites, urine  Cutoff 200 ng/mL Benzodiazepine, urine              Cutoff 200 ng/mL Methadone, urine                   Cutoff 300 ng/mL  The urine drug screen provides only a preliminary, unconfirmed analytical test result and should not be used for non-medical purposes. Clinical consideration and professional judgment should be applied to any positive drug screen result due to possible interfering substances. A more specific alternate chemical method must be used in order to obtain a confirmed analytical result. Gas chromatography / mass spectrometry (GC/MS) is the preferred confirm                          atory method. Performed at [facility], [address] [place], [place] [address]   Admission on 02/18/2021, Discharged on 02/18/2021  Component Date Value Ref Range Status   WBC 02/18/2021 15.0 (H)  4.0 - 10.5 K/uL Final   RBC 02/18/2021 5.61  4.22 - 5.81 MIL/uL Final   Hemoglobin 02/18/2021 15.9  13.0 - 17.0 g/dL Final   HCT 09/81/1914 46.8  39.0 - 52.0 % Final   MCV 02/18/2021 83.4  80.0 - 100.0 fL Final   MCH 02/18/2021 28.3  26.0 - 34.0 pg Final   MCHC 02/18/2021 34.0  30.0 - 36.0 g/dL Final   RDW 78/29/5621 13.3  11.5 - 15.5 % Final   Platelets 02/18/2021 430 (H)  150 - 400 K/uL Final   nRBC 02/18/2021 0.0  0.0 - 0.2 % Final   Neutrophils Relative % 02/18/2021 74  % Final   Neutro  Abs 02/18/2021 11.1 (H)  1.7 - 7.7 K/uL Final   Lymphocytes Relative 02/18/2021 17  % Final   Lymphs Abs 02/18/2021 2.5  0.7 - 4.0 K/uL Final   Monocytes Relative 02/18/2021 9  % Final   Monocytes Absolute 02/18/2021 1.3 (H)  0.1 - 1.0 K/uL Final   Eosinophils Relative 02/18/2021 0  % Final   Eosinophils Absolute 02/18/2021 0.0  0.0 - 0.5 K/uL Final  Basophils Relative 02/18/2021 0  % Final   Basophils Absolute 02/18/2021 0.1  0.0 - 0.1 K/uL Final   Immature Granulocytes 02/18/2021 0  % Final   Abs Immature Granulocytes 02/18/2021 0.04  0.00 - 0.07 K/uL Final   Performed at [facility], [address] [place], [place] [address]   Sodium 02/18/2021 136  135 - 145 mmol/L Final   Potassium 02/18/2021 3.8  3.5 - 5.1 mmol/L Final   Chloride 02/18/2021 99  98 - 111 mmol/L Final   CO2 02/18/2021 24  22 - 32 mmol/L Final   Glucose, Bld 02/18/2021 139 (H)  70 - 99 mg/dL Final   Glucose reference range applies only to samples taken after fasting for at least 8 hours.   BUN 02/18/2021 18  6 - 20 mg/dL Final   Creatinine, Ser 02/18/2021 1.26 (H)  0.61 - 1.24 mg/dL Final   Calcium 40/98/1191 10.3  8.9 - 10.3 mg/dL Final   Total Protein 47/82/9562 9.7 (H)  6.5 - 8.1 g/dL Final   Albumin 13/11/6576 5.2 (H)  3.5 - 5.0 g/dL Final   AST 46/96/2952 52 (H)  15 - 41 U/L Final   ALT 02/18/2021 24  0 - 44 U/L Final   Alkaline Phosphatase 02/18/2021 83  38 - 126 U/L Final   Total Bilirubin 02/18/2021 1.9 (H)  0.3 - 1.2 mg/dL Final   GFR, Estimated 02/18/2021 >60  >60 mL/min Final   Comment: (NOTE) Calculated using the CKD-EPI Creatinine Equation (2021)    Anion gap 02/18/2021 13  5 - 15 Final   Performed at [facility], [address] [place], [place] [address]   Lipase 02/18/2021 24  11 - 51 U/L Final   Performed at [facility], [address] [place], [place] [address]   SARS Coronavirus 2 by RT PCR 02/18/2021 NEGATIVE  NEGATIVE Final   Comment: (NOTE) SARS-CoV-2  target nucleic acids are NOT DETECTED.  The SARS-CoV-2 RNA is generally detectable in upper respiratory specimens during the acute phase of infection. The lowest concentration of SARS-CoV-2 viral copies this assay can detect is 138 copies/mL. A negative result does not preclude SARS-Cov-2 infection and should not be used as the sole basis for treatment or other patient management decisions. A negative result may occur with  improper specimen collection/handling, submission of specimen other than nasopharyngeal swab, presence of viral mutation(s) within the areas targeted by this assay, and inadequate number of viral copies(<138 copies/mL). A negative result must be combined with clinical observations, patient history, and epidemiological information. The expected result is Negative.  Fact Sheet for Patients:  BloggerCourse.com  Fact Sheet for Healthcare Providers:  SeriousBroker.it  This test is no                          t yet approved or cleared by the [place] FDA and  has been authorized for detection and/or diagnosis of SARS-CoV-2 by FDA under an Emergency Use Authorization (EUA). This EUA will remain  in effect (meaning this test can be used) for the duration of the COVID-19 declaration under Section 564(b)(1) of the Act, 21 U.S.C.section 360bbb-3(b)(1), unless the authorization is terminated  or revoked sooner.       Influenza A by PCR 02/18/2021 NEGATIVE  NEGATIVE Final   Influenza B by PCR 02/18/2021 NEGATIVE  NEGATIVE Final   Comment: (NOTE) The Xpert Xpress SARS-CoV-2/FLU/RSV plus assay is intended as an aid in the diagnosis of  influenza from Nasopharyngeal swab specimens and should not be used as a sole basis for treatment. Nasal washings and aspirates are unacceptable for Xpert Xpress SARS-CoV-2/FLU/RSV testing.  Fact Sheet for Patients: BloggerCourse.com  Fact Sheet for Healthcare  Providers: SeriousBroker.it  This test is not yet approved or cleared by the [place] FDA and has been authorized for detection and/or diagnosis of SARS-CoV-2 by FDA under an Emergency Use Authorization (EUA). This EUA will remain in effect (meaning this test can be used) for the duration of the COVID-19 declaration under Section 564(b)(1) of the Act, 21 U.S.C. section 360bbb-3(b)(1), unless the authorization is terminated or revoked.  Performed at [facility], [address] [place], [place] [address]   Admission on 02/13/2021, Discharged on 02/14/2021  Component Date Value Ref Range Status   SARS Coronavirus 2 by RT PCR 02/13/2021 NEGATIVE  NEGATIVE Final   Comment: (NOTE) SARS-CoV-2 target nucleic acids are NOT DETECTED.  The SARS-CoV-2 RNA is generally detectable in upper respiratory specimens during the acute phase of infection. The lowest concentration of SARS-CoV-2 viral copies this assay can detect is 138 copies/mL. A negative result does not preclude SARS-Cov-2 infection and should not be used as the sole basis for treatment or other patient management decisions. A negative result may occur with  improper specimen collection/handling, submission of specimen other than nasopharyngeal swab, presence of viral mutation(s) within the areas targeted by this assay, and inadequate number of viral copies(<138 copies/mL). A negative result must be combined with clinical observations, patient history, and epidemiological information. The expected result is Negative.  Fact Sheet for Patients:  BloggerCourse.com  Fact Sheet for Healthcare Providers:  SeriousBroker.it  This test is no                          t yet approved or cleared by the [place] FDA and  has been authorized for detection and/or diagnosis of SARS-CoV-2 by FDA under an Emergency Use Authorization (EUA). This EUA  will remain  in effect (meaning this test can be used) for the duration of the COVID-19 declaration under Section 564(b)(1) of the Act, 21 U.S.C.section 360bbb-3(b)(1), unless the authorization is terminated  or revoked sooner.       Influenza A by PCR 02/13/2021 NEGATIVE  NEGATIVE Final   Influenza B by PCR 02/13/2021 NEGATIVE  NEGATIVE Final   Comment: (NOTE) The Xpert Xpress SARS-CoV-2/FLU/RSV plus assay is intended as an aid in the diagnosis of influenza from Nasopharyngeal swab specimens and should not be used as a sole basis for treatment. Nasal washings and aspirates are unacceptable for Xpert Xpress SARS-CoV-2/FLU/RSV testing.  Fact Sheet for Patients: BloggerCourse.com  Fact Sheet for Healthcare Providers: SeriousBroker.it  This test is not yet approved or cleared by the [place] FDA and has been authorized for detection and/or diagnosis of SARS-CoV-2 by FDA under an Emergency Use Authorization (EUA). This EUA will remain in effect (meaning this test can be used) for the duration of the COVID-19 declaration under Section 564(b)(1) of the Act, 21 U.S.C. section 360bbb-3(b)(1), unless the authorization is terminated or revoked.  Performed at [facility], [address] [place], [place] [address]    Alcohol, Ethyl (B) 02/13/2021 <10  <10 mg/dL Final   Comment: (NOTE) Lowest detectable limit for serum alcohol is 10 mg/dL.  For medical purposes only. Performed at [facility], [address], [place], [place] [address]    POC Amphetamine UR 02/13/2021  Positive (A)  NONE DETECTED (Cut Off Level 1000 ng/mL) Final   POC Secobarbital (BAR) 02/13/2021 None Detected  NONE DETECTED (Cut Off Level 300 ng/mL) Final   POC Buprenorphine (BUP) 02/13/2021 None Detected  NONE DETECTED (Cut Off Level 10 ng/mL) Final   POC Oxazepam (BZO) 02/13/2021 None Detected  NONE DETECTED (Cut Off Level 300 ng/mL) Final   POC  Cocaine UR 02/13/2021 None Detected  NONE DETECTED (Cut Off Level 300 ng/mL) Final   POC Methamphetamine UR 02/13/2021 Positive (A)  NONE DETECTED (Cut Off Level 1000 ng/mL) Final   POC Morphine 02/13/2021 None Detected  NONE DETECTED (Cut Off Level 300 ng/mL) Final   POC Oxycodone UR 02/13/2021 None Detected  NONE DETECTED (Cut Off Level 100 ng/mL) Final   POC Methadone UR 02/13/2021 None Detected  NONE DETECTED (Cut Off Level 300 ng/mL) Final   POC Marijuana UR 02/13/2021 None Detected  NONE DETECTED (Cut Off Level 50 ng/mL) Final   SARSCOV2ONAVIRUS 2 AG 02/13/2021 NEGATIVE  NEGATIVE Final   Comment: (NOTE) SARS-CoV-2 antigen NOT DETECTED.   Negative results are presumptive.  Negative results do not preclude SARS-CoV-2 infection and should not be used as the sole basis for treatment or other patient management decisions, including infection  control decisions, particularly in the presence of clinical signs and  symptoms consistent with COVID-19, or in those who have been in contact with the virus.  Negative results must be combined with clinical observations, patient history, and epidemiological information. The expected result is Negative.  Fact Sheet for Patients: https://www.jennings-kim.com/  Fact Sheet for Healthcare Providers: https://alexander-rogers.biz/  This test is not yet approved or cleared by the [place] FDA and  has been authorized for detection and/or diagnosis of SARS-CoV-2 by FDA under an Emergency Use Authorization (EUA).  This EUA will remain in effect (meaning this test can be used) for the duration of  the COV                          ID-19 declaration under Section 564(b)(1) of the Act, 21 U.S.C. section 360bbb-3(b)(1), unless the authorization is terminated or revoked sooner.    Admission on 02/11/2021, Discharged on 02/12/2021  Component Date Value Ref Range Status   Sodium 02/11/2021 140  135 - 145 mmol/L Final   Potassium  02/11/2021 4.1  3.5 - 5.1 mmol/L Final   Chloride 02/11/2021 103  98 - 111 mmol/L Final   CO2 02/11/2021 24  22 - 32 mmol/L Final   Glucose, Bld 02/11/2021 89  70 - 99 mg/dL Final   Glucose reference range applies only to samples taken after fasting for at least 8 hours.   BUN 02/11/2021 12  6 - 20 mg/dL Final   Creatinine, Ser 02/11/2021 0.89  0.61 - 1.24 mg/dL Final   Calcium 29/56/2130 10.2  8.9 - 10.3 mg/dL Final   Total Protein 86/57/8469 8.4 (H)  6.5 - 8.1 g/dL Final   Albumin 62/95/2841 4.6  3.5 - 5.0 g/dL Final   AST 32/44/0102 40  15 - 41 U/L Final   ALT 02/11/2021 19  0 - 44 U/L Final   Alkaline Phosphatase 02/11/2021 83  38 - 126 U/L Final   Total Bilirubin 02/11/2021 1.7 (H)  0.3 - 1.2 mg/dL Final   GFR, Estimated 02/11/2021 >60  >60 mL/min Final   Comment: (NOTE) Calculated using the CKD-EPI Creatinine Equation (2021)    Anion gap 02/11/2021 13  5 - 15 Final  Performed at [facility], [address] [place], [place] [address]   Alcohol, Ethyl (B) 02/11/2021 <10  <10 mg/dL Final   Comment: (NOTE) Lowest detectable limit for serum alcohol is 10 mg/dL.  For medical purposes only. Performed at [facility], [address] [place], [place] [address]    Salicylate Lvl 02/11/2021 <7.0 (L)  7.0 - 30.0 mg/dL Final   Performed at [facility], [address], [place], [place] [address]   Acetaminophen (Tylenol), Serum 02/11/2021 <10 (L)  10 - 30 ug/mL Final   Comment: (NOTE) Therapeutic concentrations vary significantly. A range of 10-30 ug/mL  may be an effective concentration for many patients. However, some  are best treated at concentrations outside of this range. Acetaminophen concentrations >150 ug/mL at 4 hours after ingestion  and >50 ug/mL at 12 hours after ingestion are often associated with  toxic reactions.  Performed at [facility], [address], [place], [place] [address]    WBC 02/11/2021 11.6 (H)  4.0 - 10.5 K/uL Final   RBC  02/11/2021 5.47  4.22 - 5.81 MIL/uL Final   Hemoglobin 02/11/2021 15.5  13.0 - 17.0 g/dL Final   HCT 65/78/4696 46.5  39.0 - 52.0 % Final   MCV 02/11/2021 85.0  80.0 - 100.0 fL Final   MCH 02/11/2021 28.3  26.0 - 34.0 pg Final   MCHC 02/11/2021 33.3  30.0 - 36.0 g/dL Final   RDW 29/52/8413 13.8  11.5 - 15.5 % Final   Platelets 02/11/2021 337  150 - 400 K/uL Final   nRBC 02/11/2021 0.0  0.0 - 0.2 % Final   Performed at [facility], [address], [place], [place] [address]   Opiates 02/11/2021 NONE DETECTED  NONE DETECTED Final   Cocaine 02/11/2021 NONE DETECTED  NONE DETECTED Final   Benzodiazepines 02/11/2021 NONE DETECTED  NONE DETECTED Final   Amphetamines 02/11/2021 POSITIVE (A)  NONE DETECTED Final   Tetrahydrocannabinol 02/11/2021 NONE DETECTED  NONE DETECTED Final   Barbiturates 02/11/2021 NONE DETECTED  NONE DETECTED Final   Comment: (NOTE) DRUG SCREEN FOR MEDICAL PURPOSES ONLY.  IF CONFIRMATION IS NEEDED FOR ANY PURPOSE, NOTIFY LAB WITHIN 5 DAYS.  LOWEST DETECTABLE LIMITS FOR URINE DRUG SCREEN Drug Class                     Cutoff (ng/mL) Amphetamine and metabolites    1000 Barbiturate and metabolites    200 Benzodiazepine                 200 Tricyclics and metabolites     300 Opiates and metabolites        300 Cocaine and metabolites        300 THC                            50 Performed at [facility], [address] [place], [place] [address]    SARS Coronavirus 2 by RT PCR 02/11/2021 NEGATIVE  NEGATIVE Final   Comment: (NOTE) SARS-CoV-2 target nucleic acids are NOT DETECTED.  The SARS-CoV-2 RNA is generally detectable in upper respiratory specimens during the acute phase of infection. The lowest concentration of SARS-CoV-2 viral copies this assay can detect is 138 copies/mL. A negative result does not preclude SARS-Cov-2 infection and should not be used as the sole basis for treatment or other patient management decisions. A negative  result may  occur with  improper specimen collection/handling, submission of specimen other than nasopharyngeal swab, presence of viral mutation(s) within the areas targeted by this assay, and inadequate number of viral copies(<138 copies/mL). A negative result must be combined with clinical observations, patient history, and epidemiological information. The expected result is Negative.  Fact Sheet for Patients:  BloggerCourse.com  Fact Sheet for Healthcare Providers:  SeriousBroker.it  This test is no                          t yet approved or cleared by the [place] FDA and  has been authorized for detection and/or diagnosis of SARS-CoV-2 by FDA under an Emergency Use Authorization (EUA). This EUA will remain  in effect (meaning this test can be used) for the duration of the COVID-19 declaration under Section 564(b)(1) of the Act, 21 U.S.C.section 360bbb-3(b)(1), unless the authorization is terminated  or revoked sooner.       Influenza A by PCR 02/11/2021 NEGATIVE  NEGATIVE Final   Influenza B by PCR 02/11/2021 NEGATIVE  NEGATIVE Final   Comment: (NOTE) The Xpert Xpress SARS-CoV-2/FLU/RSV plus assay is intended as an aid in the diagnosis of influenza from Nasopharyngeal swab specimens and should not be used as a sole basis for treatment. Nasal washings and aspirates are unacceptable for Xpert Xpress SARS-CoV-2/FLU/RSV testing.  Fact Sheet for Patients: BloggerCourse.com  Fact Sheet for Healthcare Providers: SeriousBroker.it  This test is not yet approved or cleared by the [place] FDA and has been authorized for detection and/or diagnosis of SARS-CoV-2 by FDA under an Emergency Use Authorization (EUA). This EUA will remain in effect (meaning this test can be used) for the duration of the COVID-19 declaration under Section 564(b)(1) of the Act, 21 U.S.C. section  360bbb-3(b)(1), unless the authorization is terminated or revoked.  Performed at [facility], [address], [place], [place] [address]   Admission on 02/05/2021, Discharged on 02/05/2021  Component Date Value Ref Range Status   SARS Coronavirus 2 by RT PCR 02/05/2021 NEGATIVE  NEGATIVE Final   Comment: (NOTE) SARS-CoV-2 target nucleic acids are NOT DETECTED.  The SARS-CoV-2 RNA is generally detectable in upper respiratory specimens during the acute phase of infection. The lowest concentration of SARS-CoV-2 viral copies this assay can detect is 138 copies/mL. A negative result does not preclude SARS-Cov-2 infection and should not be used as the sole basis for treatment or other patient management decisions. A negative result may occur with  improper specimen collection/handling, submission of specimen other than nasopharyngeal swab, presence of viral mutation(s) within the areas targeted by this assay, and inadequate number of viral copies(<138 copies/mL). A negative result must be combined with clinical observations, patient history, and epidemiological information. The expected result is Negative.  Fact Sheet for Patients:  BloggerCourse.com  Fact Sheet for Healthcare Providers:  SeriousBroker.it  This test is no                          t yet approved or cleared by the [place] FDA and  has been authorized for detection and/or diagnosis of SARS-CoV-2 by FDA under an Emergency Use Authorization (EUA). This EUA will remain  in effect (meaning this test can be used) for the duration of the COVID-19 declaration under Section 564(b)(1) of the Act, 21 U.S.C.section 360bbb-3(b)(1), unless the authorization is terminated  or revoked sooner.       Influenza  A by PCR 02/05/2021 NEGATIVE  NEGATIVE Final   Influenza B by PCR 02/05/2021 NEGATIVE  NEGATIVE Final   Comment: (NOTE) The Xpert Xpress SARS-CoV-2/FLU/RSV plus  assay is intended as an aid in the diagnosis of influenza from Nasopharyngeal swab specimens and should not be used as a sole basis for treatment. Nasal washings and aspirates are unacceptable for Xpert Xpress SARS-CoV-2/FLU/RSV testing.  Fact Sheet for Patients: BloggerCourse.com  Fact Sheet for Healthcare Providers: SeriousBroker.it  This test is not yet approved or cleared by the [place] FDA and has been authorized for detection and/or diagnosis of SARS-CoV-2 by FDA under an Emergency Use Authorization (EUA). This EUA will remain in effect (meaning this test can be used) for the duration of the COVID-19 declaration under Section 564(b)(1) of the Act, 21 U.S.C. section 360bbb-3(b)(1), unless the authorization is terminated or revoked.  Performed at [facility], [address] [place], [place] [address]    SARS Coronavirus 2 Ag 02/05/2021 Negative  Negative Preliminary   WBC 02/05/2021 9.2  4.0 - 10.5 K/uL Final   RBC 02/05/2021 5.05  4.22 - 5.81 MIL/uL Final   Hemoglobin 02/05/2021 14.4  13.0 - 17.0 g/dL Final   HCT 60/45/4098 43.1  39.0 - 52.0 % Final   MCV 02/05/2021 85.3  80.0 - 100.0 fL Final   MCH 02/05/2021 28.5  26.0 - 34.0 pg Final   MCHC 02/05/2021 33.4  30.0 - 36.0 g/dL Final   RDW 11/91/4782 14.0  11.5 - 15.5 % Final   Platelets 02/05/2021 346  150 - 400 K/uL Final   nRBC 02/05/2021 0.0  0.0 - 0.2 % Final   Neutrophils Relative % 02/05/2021 59  % Final   Neutro Abs 02/05/2021 5.3  1.7 - 7.7 K/uL Final   Lymphocytes Relative 02/05/2021 29  % Final   Lymphs Abs 02/05/2021 2.7  0.7 - 4.0 K/uL Final   Monocytes Relative 02/05/2021 7  % Final   Monocytes Absolute 02/05/2021 0.7  0.1 - 1.0 K/uL Final   Eosinophils Relative 02/05/2021 5  % Final   Eosinophils Absolute 02/05/2021 0.5  0.0 - 0.5 K/uL Final   Basophils Relative 02/05/2021 0  % Final   Basophils Absolute 02/05/2021 0.0  0.0 - 0.1 K/uL Final    Immature Granulocytes 02/05/2021 0  % Final   Abs Immature Granulocytes 02/05/2021 0.03  0.00 - 0.07 K/uL Final   Performed at [facility], [address], [place], [place] [address]   Sodium 02/05/2021 138  135 - 145 mmol/L Final   Potassium 02/05/2021 4.0  3.5 - 5.1 mmol/L Final   Chloride 02/05/2021 102  98 - 111 mmol/L Final   CO2 02/05/2021 25  22 - 32 mmol/L Final   Glucose, Bld 02/05/2021 88  70 - 99 mg/dL Final   Glucose reference range applies only to samples taken after fasting for at least 8 hours.   BUN 02/05/2021 15  6 - 20 mg/dL Final   Creatinine, Ser 02/05/2021 0.76  0.61 - 1.24 mg/dL Final   Calcium 30/86/5784 9.8  8.9 - 10.3 mg/dL Final   Total Protein 69/62/9528 7.5  6.5 - 8.1 g/dL Final   Albumin 41/32/4401 4.3  3.5 - 5.0 g/dL Final   AST 02/72/5366 21  15 - 41 U/L Final   ALT 02/05/2021 16  0 - 44 U/L Final   Alkaline Phosphatase 02/05/2021 76  38 - 126 U/L Final   Total Bilirubin 02/05/2021 1.0  0.3 -  1.2 mg/dL Final   GFR, Estimated 02/05/2021 >60  >60 mL/min Final   Comment: (NOTE) Calculated using the CKD-EPI Creatinine Equation (2021)    Anion gap 02/05/2021 11  5 - 15 Final   Performed at [facility], [address], [place], [place] [address]   Alcohol, Ethyl (B) 02/05/2021 <10  <10 mg/dL Final   Comment: (NOTE) Lowest detectable limit for serum alcohol is 10 mg/dL.  For medical purposes only. Performed at [facility], [address] [place], [place] [address]    POC Amphetamine UR 02/05/2021 None Detected  NONE DETECTED (Cut Off Level 1000 ng/mL) Preliminary   POC Secobarbital (BAR) 02/05/2021 None Detected  NONE DETECTED (Cut Off Level 300 ng/mL) Preliminary   POC Buprenorphine (BUP) 02/05/2021 None Detected  NONE DETECTED (Cut Off Level 10 ng/mL) Preliminary   POC Oxazepam (BZO) 02/05/2021 None Detected  NONE DETECTED (Cut Off Level 300 ng/mL) Preliminary   POC Cocaine UR 02/05/2021 None Detected  NONE DETECTED (Cut Off Level  300 ng/mL) Preliminary   POC Methamphetamine UR 02/05/2021 None Detected  NONE DETECTED (Cut Off Level 1000 ng/mL) Preliminary   POC Morphine 02/05/2021 None Detected  NONE DETECTED (Cut Off Level 300 ng/mL) Preliminary   POC Oxycodone UR 02/05/2021 None Detected  NONE DETECTED (Cut Off Level 100 ng/mL) Preliminary   POC Methadone UR 02/05/2021 None Detected  NONE DETECTED (Cut Off Level 300 ng/mL) Preliminary   POC Marijuana UR 02/05/2021 None Detected  NONE DETECTED (Cut Off Level 50 ng/mL) Preliminary   TSH 02/05/2021 5.750 (H)  0.350 - 4.500 uIU/mL Final   Comment: Performed by a 3rd Generation assay with a functional sensitivity of <=0.01 uIU/mL. Performed at [facility], [address], [place], [place] [address]    SARSCOV2ONAVIRUS 2 AG 02/05/2021 NEGATIVE  NEGATIVE Final   Comment: (NOTE) SARS-CoV-2 antigen NOT DETECTED.   Negative results are presumptive.  Negative results do not preclude SARS-CoV-2 infection and should not be used as the sole basis for treatment or other patient management decisions, including infection  control decisions, particularly in the presence of clinical signs and  symptoms consistent with COVID-19, or in those who have been in contact with the virus.  Negative results must be combined with clinical observations, patient history, and epidemiological information. The expected result is Negative.  Fact Sheet for Patients: https://www.jennings-kim.com/  Fact Sheet for Healthcare Providers: https://alexander-rogers.biz/  This test is not yet approved or cleared by the [place] FDA and  has been authorized for detection and/or diagnosis of SARS-CoV-2 by FDA under an Emergency Use Authorization (EUA).  This EUA will remain in effect (meaning this test can be used) for the duration of  the COV                          ID-19 declaration under Section 564(b)(1) of the Act, 21 U.S.C. section 360bbb-3(b)(1), unless the  authorization is terminated or revoked sooner.     Free T4 02/05/2021 0.80  0.61 - 1.12 ng/dL Final   Comment: (NOTE) Biotin ingestion may interfere with free T4 tests. If the results are inconsistent with the TSH level, previous test results, or the clinical presentation, then consider biotin interference. If needed, order repeat testing after stopping biotin. Performed at [facility], [address], [place], [place] [address]    T3, Free 02/05/2021 3.8  2.0 - 4.4 pg/mL Final   Comment: (NOTE) Performed At:  [facility] [place] [address] [place], [place] [id number] Jolene Schimke MD WU:[id number]   Admission on 02/04/2021, Discharged on 02/04/2021  Component Date Value Ref Range Status   SARS Coronavirus 2 by RT PCR 02/04/2021 NEGATIVE  NEGATIVE Final   Comment: (NOTE) SARS-CoV-2 target nucleic acids are NOT DETECTED.  The SARS-CoV-2 RNA is generally detectable in upper respiratory specimens during the acute phase of infection. The lowest concentration of SARS-CoV-2 viral copies this assay can detect is 138 copies/mL. A negative result does not preclude SARS-Cov-2 infection and should not be used as the sole basis for treatment or other patient management decisions. A negative result may occur with  improper specimen collection/handling, submission of specimen other than nasopharyngeal swab, presence of viral mutation(s) within the areas targeted by this assay, and inadequate number of viral copies(<138 copies/mL). A negative result must be combined with clinical observations, patient history, and epidemiological information. The expected result is Negative.  Fact Sheet for Patients:  BloggerCourse.com  Fact Sheet for Healthcare Providers:  SeriousBroker.it  This test is no                          t yet approved or cleared by the [place] FDA and  has been authorized for detection and/or diagnosis of  SARS-CoV-2 by FDA under an Emergency Use Authorization (EUA). This EUA will remain  in effect (meaning this test can be used) for the duration of the COVID-19 declaration under Section 564(b)(1) of the Act, 21 U.S.C.section 360bbb-3(b)(1), unless the authorization is terminated  or revoked sooner.       Influenza A by PCR 02/04/2021 NEGATIVE  NEGATIVE Final   Influenza B by PCR 02/04/2021 NEGATIVE  NEGATIVE Final   Comment: (NOTE) The Xpert Xpress SARS-CoV-2/FLU/RSV plus assay is intended as an aid in the diagnosis of influenza from Nasopharyngeal swab specimens and should not be used as a sole basis for treatment. Nasal washings and aspirates are unacceptable for Xpert Xpress SARS-CoV-2/FLU/RSV testing.  Fact Sheet for Patients: BloggerCourse.com  Fact Sheet for Healthcare Providers: SeriousBroker.it  This test is not yet approved or cleared by the [place] FDA and has been authorized for detection and/or diagnosis of SARS-CoV-2 by FDA under an Emergency Use Authorization (EUA). This EUA will remain in effect (meaning this test can be used) for the duration of the COVID-19 declaration under Section 564(b)(1) of the Act, 21 U.S.C. section 360bbb-3(b)(1), unless the authorization is terminated or revoked.  Performed at [facility], [address] [place], [place] [address]   Admission on 01/16/2021, Discharged on 01/24/2021  Component Date Value Ref Range Status   Cholesterol 01/17/2021 94  0 - 200 mg/dL Final   Triglycerides 30/86/5784 100  <150 mg/dL Final   HDL 69/62/9528 28 (L)  >40 mg/dL Final   Total CHOL/HDL Ratio 01/17/2021 3.4  RATIO Final   VLDL 01/17/2021 20  0 - 40 mg/dL Final   LDL Cholesterol 01/17/2021 46  0 - 99 mg/dL Final   Comment:        Total Cholesterol/HDL:CHD Risk Coronary Heart Disease Risk Table                     Men   Women  1/2 Average Risk   3.4   3.3  Average Risk       5.0    4.4  2 X Average Risk   9.6   7.1  3 X Average  Risk  23.4   11.0        Use the calculated Patient Ratio above and the CHD Risk Table to determine the patient's CHD Risk.        ATP III CLASSIFICATION (LDL):  <100     mg/dL   Optimal  409-811  mg/dL   Near or Above                    Optimal  130-159  mg/dL   Borderline  914-782  mg/dL   High  >956     mg/dL   Very High Performed at [facility], [address] [place], [place] [address]    Hgb A1c MFr Bld 01/17/2021 6.0 (H)  4.8 - 5.6 % Final   Comment: (NOTE)         Prediabetes: 5.7 - 6.4         Diabetes: >6.4         Glycemic control for adults with diabetes: <7.0    Mean Plasma Glucose 01/17/2021 126  mg/dL Final   Comment: (NOTE) Performed At: [facility] [address] [place], [place] [id number] Jolene Schimke MD XB:[id number]   Admission on 01/16/2021, Discharged on 01/16/2021  Component Date Value Ref Range Status   Sodium 01/16/2021 134 (L)  135 - 145 mmol/L Final   Potassium 01/16/2021 3.8  3.5 - 5.1 mmol/L Final   Chloride 01/16/2021 96 (L)  98 - 111 mmol/L Final   CO2 01/16/2021 27  22 - 32 mmol/L Final   Glucose, Bld 01/16/2021 79  70 - 99 mg/dL Final   Glucose reference range applies only to samples taken after fasting for at least 8 hours.   BUN 01/16/2021 8  6 - 20 mg/dL Final   Creatinine, Ser 01/16/2021 0.83  0.61 - 1.24 mg/dL Final   Calcium 02/72/5366 9.3  8.9 - 10.3 mg/dL Final   Total Protein 44/06/4740 7.6  6.5 - 8.1 g/dL Final   Albumin 59/56/3875 4.0  3.5 - 5.0 g/dL Final   AST 64/33/2951 22  15 - 41 U/L Final   ALT 01/16/2021 18  0 - 44 U/L Final   Alkaline Phosphatase 01/16/2021 58  38 - 126 U/L Final   Total Bilirubin 01/16/2021 0.8  0.3 - 1.2 mg/dL Final   GFR, Estimated 01/16/2021 >60  >60 mL/min Final   Comment: (NOTE) Calculated using the CKD-EPI Creatinine Equation (2021)    Anion gap 01/16/2021 11  5 - 15 Final   Performed at [facility], [address] [place], [place] [address]   Alcohol, Ethyl (B) 01/16/2021 <10  <10 mg/dL Final   Comment: (NOTE) Lowest detectable limit for serum alcohol is 10 mg/dL.  For medical purposes only. Performed at [facility], [address] [place], [place] [address]    Salicylate Lvl 01/16/2021 <7.0 (L)  7.0 - 30.0 mg/dL Final   Performed at [facility] [address] [place], [place] [address]   Acetaminophen (Tylenol), Serum 01/16/2021 <10 (L)  10 - 30 ug/mL Final   Comment: (NOTE) Therapeutic concentrations vary significantly. A range of 10-30 ug/mL  may be an effective concentration for many patients. However, some  are best treated at concentrations outside of this range. Acetaminophen concentrations >150 ug/mL at 4 hours after ingestion  and >50 ug/mL at 12 hours after ingestion are often associated with  toxic reactions.  Performed at [facility], [address], [place], [place] [address]  WBC 01/16/2021 8.8  4.0 - 10.5 K/uL Final   RBC 01/16/2021 4.89  4.22 - 5.81 MIL/uL Final   Hemoglobin 01/16/2021 14.4  13.0 - 17.0 g/dL Final   HCT 11/91/4782 41.7  39.0 - 52.0 % Final   MCV 01/16/2021 85.3  80.0 - 100.0 fL Final   MCH 01/16/2021 29.4  26.0 - 34.0 pg Final   MCHC 01/16/2021 34.5  30.0 - 36.0 g/dL Final   RDW 95/62/1308 13.5  11.5 - 15.5 % Final   Platelets 01/16/2021 402 (H)  150 - 400 K/uL Final   nRBC 01/16/2021 0.0  0.0 - 0.2 % Final   Performed at [facility], [address] [place], [place] [address]   Tricyclic, Ur Screen 01/16/2021 NONE DETECTED  NONE DETECTED Final   Amphetamines, Ur Screen 01/16/2021 POSITIVE (A)  NONE DETECTED Final   MDMA (Ecstasy)Ur Screen 01/16/2021 NONE DETECTED  NONE DETECTED Final   Cocaine Metabolite,Ur [place] 01/16/2021 NONE DETECTED  NONE DETECTED Final   Opiate, Ur Screen 01/16/2021 NONE DETECTED  NONE DETECTED Final   Phencyclidine (PCP) Ur S 01/16/2021 NONE DETECTED  NONE DETECTED Final    Cannabinoid 50 Ng, Ur [place] 01/16/2021 NONE DETECTED  NONE DETECTED Final   Barbiturates, Ur Screen 01/16/2021 NONE DETECTED  NONE DETECTED Final   Benzodiazepine, Ur Scrn 01/16/2021 NONE DETECTED  NONE DETECTED Final   Methadone Scn, Ur 01/16/2021 NONE DETECTED  NONE DETECTED Final   Comment: (NOTE) Tricyclics + metabolites, urine    Cutoff 1000 ng/mL Amphetamines + metabolites, urine  Cutoff 1000 ng/mL MDMA (Ecstasy), urine              Cutoff 500 ng/mL Cocaine Metabolite, urine          Cutoff 300 ng/mL Opiate + metabolites, urine        Cutoff 300 ng/mL Phencyclidine (PCP), urine         Cutoff 25 ng/mL Cannabinoid, urine                 Cutoff 50 ng/mL Barbiturates + metabolites, urine  Cutoff 200 ng/mL Benzodiazepine, urine              Cutoff 200 ng/mL Methadone, urine                   Cutoff 300 ng/mL  The urine drug screen provides only a preliminary, unconfirmed analytical test result and should not be used for non-medical purposes. Clinical consideration and professional judgment should be applied to any positive drug screen result due to possible interfering substances. A more specific alternate chemical method must be used in order to obtain a confirmed analytical result. Gas chromatography / mass spectrometry (GC/MS) is the preferred confirm                          atory method. Performed at [facility], [address] [place], [place] [address]    SARS Coronavirus 2 by RT PCR 01/16/2021 NEGATIVE  NEGATIVE Final   Comment: (NOTE) SARS-CoV-2 target nucleic acids are NOT DETECTED.  The SARS-CoV-2 RNA is generally detectable in upper respiratory specimens during the acute phase of infection. The lowest concentration of SARS-CoV-2 viral copies this assay can detect is 138 copies/mL. A negative result does not preclude SARS-Cov-2 infection and should not be used as the sole basis for treatment or other patient management decisions. A negative result may occur  with  improper specimen collection/handling, submission of  specimen other than nasopharyngeal swab, presence of viral mutation(s) within the areas targeted by this assay, and inadequate number of viral copies(<138 copies/mL). A negative result must be combined with clinical observations, patient history, and epidemiological information. The expected result is Negative.  Fact Sheet for Patients:  BloggerCourse.com  Fact Sheet for Healthcare Providers:  SeriousBroker.it  This test is no                          t yet approved or cleared by the [place] FDA and  has been authorized for detection and/or diagnosis of SARS-CoV-2 by FDA under an Emergency Use Authorization (EUA). This EUA will remain  in effect (meaning this test can be used) for the duration of the COVID-19 declaration under Section 564(b)(1) of the Act, 21 U.S.C.section 360bbb-3(b)(1), unless the authorization is terminated  or revoked sooner.       Influenza A by PCR 01/16/2021 NEGATIVE  NEGATIVE Final   Influenza B by PCR 01/16/2021 NEGATIVE  NEGATIVE Final   Comment: (NOTE) The Xpert Xpress SARS-CoV-2/FLU/RSV plus assay is intended as an aid in the diagnosis of influenza from Nasopharyngeal swab specimens and should not be used as a sole basis for treatment. Nasal washings and aspirates are unacceptable for Xpert Xpress SARS-CoV-2/FLU/RSV testing.  Fact Sheet for Patients: BloggerCourse.com  Fact Sheet for Healthcare Providers: SeriousBroker.it  This test is not yet approved or cleared by the [place] FDA and has been authorized for detection and/or diagnosis of SARS-CoV-2 by FDA under an Emergency Use Authorization (EUA). This EUA will remain in effect (meaning this test can be used) for the duration of the COVID-19 declaration under Section 564(b)(1) of the Act, 21 U.S.C. section 360bbb-3(b)(1),  unless the authorization is terminated or revoked.  Performed at [facility], [address] [place], [place] [address]   There may be more visits with results that are not included.    Blood Alcohol level:  Lab Results  Component Value Date   ETH <10 04/14/2021   ETH <10 02/22/2021    Metabolic Disorder Labs: Lab Results  Component Value Date   HGBA1C 6.0 (H) 01/17/2021   MPG 126 01/17/2021   MPG 126 10/08/2020   No results found for: PROLACTIN Lab Results  Component Value Date   CHOL 94 01/17/2021   TRIG 100 01/17/2021   HDL 28 (L) 01/17/2021   CHOLHDL 3.4 01/17/2021   VLDL 20 01/17/2021   LDLCALC 46 01/17/2021   LDLCALC 49 10/08/2020    Therapeutic Lab Levels: No results found for: LITHIUM Lab Results  Component Value Date   VALPROATE 62 10/16/2019   No components found for:  CBMZ  Physical Findings   AIMS    Flowsheet Row Admission (Discharged) from 01/16/2021 in [facility]) [facility]) [facility]  AIMS Total Score 0 0 0      AUDIT    Flowsheet Row Admission (Discharged) from 01/16/2021 in [facility]PAT[facility] Admission (Discharged) [facility] Ad[facility] (Discharged[facility] Admission (Discharged) from 10/10/2019 in [facility]PATIENT BEH[facility]AL MEDICINE Admission (Discharged) from 09/14/2019 in [facility] INPATIENT BEHAVIORAL MEDICINE  Alcohol Use Disorder Identification Test Final Score (AUDIT) 1 34 15 0 0      PHQ2-9    Flowsheet Row ED from  04/14/2021 in [facility] EMERGENCY DEPARTMENT ED from 02/05/2021 in [facility] ED from 01/14/2021 in [place] EMERGENCY DEPARTMENT ED from 11/10/2020 in [facility] EMERGENCY DEPARTMENT ED from  10/28/2020 in [place] EMERGENCY DEPARTMENT  PHQ-2 Total Score 2 4 2 4 3   PHQ-9 Total Score 16 9 6 8 10       Flowsheet Row ED from 04/15/2021 in [facility] ED from 04/14/2021 in [place] EMERGENCY DEPARTMENT ED from 04/05/2021 in [facility]  C-SSRS RISK CATEGORY Low Risk High Risk No Risk        Musculoskeletal  Strength & Muscle Tone: within normal limits Gait & Station: normal Patient leans: N/A  Psychiatric Specialty Exam  Presentation  General Appearance: Appropriate for Environment; Casual  Eye Contact:Good  Speech:Clear and Coherent; Normal Rate  Speech Volume:Normal  Handedness:Right   Mood and Affect  Mood:-- ("ok")  Affect:Appropriate; Congruent; Other (comment) (brighter than yesterday)   Thought Process  Thought Processes:Coherent; Goal Directed; Linear  Descriptions of Associations:Intact  Orientation:Full (Time, Place and Person)  Thought Content:WDL; Logical  Diagnosis of Schizophrenia or Schizoaffective disorder in past: No    Hallucinations:Hallucinations: None  Ideas of Reference:None  Suicidal Thoughts:Suicidal Thoughts: Yes, Passive  SI Passive Intent and/or Plan: Without Intent; Without Plan  Homicidal Thoughts:Homicidal Thoughts: No   Sensorium  Memory:Immediate Good; Recent Good; Remote Good  Judgment:Fair  Insight:Fair   Executive Functions  Concentration:Good  Attention Span:Good  Recall:Good  Fund of Knowledge:Good  Language:Good   Psychomotor Activity  Psychomotor Activity:Psychomotor Activity: Normal   Assets  Assets:Communication Skills; Desire for Improvement; Resilience   Sleep  Sleep:Sleep: Poor   No data recorded   Physical Exam  Physical Exam Constitutional:      Appearance: Normal appearance. He is normal weight.  HENT:     Head: Normocephalic and atraumatic.  Eyes:     Extraocular Movements: Extraocular movements intact.   Pulmonary:     Effort: Pulmonary effort is normal.  Neurological:     General: No focal deficit present.     Mental Status: He is alert and oriented to person, place, and time.  Psychiatric:        Attention and Perception: Attention and perception normal.        Speech: Speech normal.        Behavior: Behavior normal. Behavior is cooperative.        Thought Content: Thought content normal.   Review of Systems  Constitutional:  Negative for chills and fever.  HENT:  Negative for hearing loss.   Eyes:  Negative for discharge and redness.  Respiratory:  Negative for cough.   Cardiovascular:  Negative for chest pain.  Gastrointestinal:  Negative for abdominal pain.  Musculoskeletal:  Negative for myalgias.  Neurological:  Negative for headaches.  Psychiatric/Behavioral:  Positive for depression, substance abuse and suicidal ideas.        Passive SI   Blood pressure 131/68, pulse 80, temperature 98 F (36.7 C), temperature source Oral, resp. rate 16, SpO2 100 %. There is no height or weight on file to calculate BMI.  Treatment Plan Summary: 30 yo male with history of substance abuse, MDD, substance induced mood disorder, substance induced psychotic disorder who presented initially to 14/01/2021 ED on 12/19 with SI with a plan to overdose on pills. Patient was unable to contract for safety and was transferred to the [facility] on 12/20 for further treatment and then 12/21 to the [facility] for  crisis stabilization. UDS +amphetamine.  Labs generally unremarkable and he was medically cleared in the ED prior to tranfer to the [facility]. Patient reported relapsing on methamphetamine a couple days ago and voluntarily leaving [place] [facility] as he knew he would be asked to leave;he reports being homeless for ~1 week.   Patient unable to identify any stressors leading to relapse or SI.  Per collateral information obtained by Pearletha Furl NP while in the  [facility] ED-patient was kicked out of [place] house 2 weeks ago for  relapsing and has been spending his paycheck on drugs; she reported that [place] is not welcome home due to his drug use at this time.  Patient noted to provide inconsistent history while in the [facility]; he reports somewhat different history regarding timelines of relapse, leaving [place] [facility], etc while at the [facility]. Suspicion that there is some element of  secondary gain due to currently being undomiciled.   Patient continues to report passive SI-no plan or intent.  He is tolerating Zoloft well without side effects. He reports poor sleep and frequents to restart seroquel which he has historically done well with-will restart at 25 mg qhs..  He denies HI/AVH.  Patient accepted to [facility] for treatment on  12/23.He remains appropriate for continued treatment at the [facility] for crisis stabilization-continuing to make medication adjustments.      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 Has recently been using 1 gram/day -patient expresses interest in substance use treatment-SW consulted for assistance.    Congestion -continue Sodium chloride 0.65% nasal spray (1 spray each nare) as needed ordered for congestion   Dispo: ongoing. SW assisting. Has been accepted to [facility] 12/27-will need 2 weeks of medications &30 day scripts with 1 refill  Estella Husk, MD 04/18/2021 10:59 AM

## 2021-04-18 NOTE — ED Notes (Signed)
Pt currently resting in assigned room. No distress observed. Safety maintained and will continue to monitor.  °

## 2021-04-18 NOTE — ED Notes (Signed)
Pt sitting in dayroom watching television. Calm and cooperative. No c/o pain or distress. Will continue to monitor for safety

## 2021-04-19 DIAGNOSIS — F1994 Other psychoactive substance use, unspecified with psychoactive substance-induced mood disorder: Secondary | ICD-10-CM | POA: Diagnosis not present

## 2021-04-19 DIAGNOSIS — F17210 Nicotine dependence, cigarettes, uncomplicated: Secondary | ICD-10-CM | POA: Diagnosis not present

## 2021-04-19 DIAGNOSIS — F332 Major depressive disorder, recurrent severe without psychotic features: Secondary | ICD-10-CM | POA: Diagnosis not present

## 2021-04-19 DIAGNOSIS — R45851 Suicidal ideations: Secondary | ICD-10-CM | POA: Diagnosis not present

## 2021-04-19 MED ORDER — MENTHOL 3 MG MT LOZG
1.0000 | LOZENGE | OROMUCOSAL | Status: DC | PRN
  Administered 2021-04-19: 12:00:00 3 mg via ORAL

## 2021-04-19 NOTE — ED Notes (Addendum)
Pt at phone.  Breakfast has been provided.  No complaints of pain at this time.  Pt is c/o throat discomfort, provider notified.  Will continue to monitor for safety.

## 2021-04-19 NOTE — ED Notes (Signed)
Pt was on telephone call curing and raising his voice. He was asked to end the phone call to calm down. He did and apologize. Pt in dayroom calm and cooperative. No c/o pain or distress. Will continue to monitor for safety

## 2021-04-19 NOTE — Progress Notes (Signed)
Received Nathan Koch this AM asleep in his bed, he continued to sleep late. No acute distress noted.

## 2021-04-19 NOTE — Progress Notes (Signed)
Nathan Koch has been napping throughout the day, social with his peers during meal time. He continued to make and receive several calls throughout the day.

## 2021-04-19 NOTE — ED Provider Notes (Signed)
Behavioral Health Progress Note  Date and Time: 04/19/2021 8:47 AM Name: Nathan Koch MRN:  [id number]  Subjective:  Nathan Koch, 30 y.o., male patient admitted to the [facility] unit on 12/20.2022.  Patient initially presented to [facility] ED on 12/1 9 with SI with a plan to overdose on pills.  Patient seen face to face by this provider, consulted with Dr. Bronwen Betters and chart reviewed on 04/19/21.  Upon admission patient UDS was positive for amphetamines.  During evaluation Nathan Koch is in sitting position.  He is in no acute distress.  He is alert/oriented x4.  He endorses depression with a congruent affect.  He denies SI/HI/AVH.  Reports his mood and his health has improved. He recently had a viral infection and complains of sore a throat, but states, "it feels better than it did yesterday". Patient request sore throat lozenges and denies any other health concerns.  Denies any withdrawal symptoms. He is tolerating medications with out any adverse reactions.   Patient has attended/participated with group.  He interacts appropriately with staff and peers.  Reports he has been accepted into [facility] for Tuesday 12/27. Provided encouragement and support.     Diagnosis:  Final diagnoses:  Severe episode of recurrent major depressive disorder, without psychotic features ([name])  Methamphetamine use disorder, moderate ([name])  Amphetamine-induced mood disorder ([name])  Passive suicidal ideations  Homelessness    Total Time spent with patient: 30 minutes  Past Psychiatric History: see h&p Past Medical History:  Past Medical History:  Diagnosis Date   Anxiety    Anxiety disorder    Asthma    Bipolar 1 disorder ([name])    Depression    Methamphetamine abuse ([name])    PTSD (post-traumatic stress disorder)     Past Surgical History:  Procedure Laterality Date   CLOSED REDUCTION MANDIBLE N/A 07/02/2018   Procedure: CLOSED REDUCTION MANDIBULAR;  Surgeon: Vernie Murders, MD;  Location: [facility] ORS;   Service: ENT;  Laterality: N/A;   Family History:  Family History  Problem Relation Age of Onset   Asthma Mother    Cancer Mother        breast cancer   Ulcers Mother    Cancer Father        esophogeal cancer   Hypertension Father    Asthma Brother    Family Psychiatric  History: see h&p Social History:  Social History   Substance and Sexual Activity  Alcohol Use Not Currently   Alcohol/week: 13.0 standard drinks   Types: 6 Cans of beer, 7 Standard drinks or equivalent per week   Comment: previously heavy drinker 2016. most recent 4 beer/ day drinker and none since 01-11-2020     Social History   Substance and Sexual Activity  Drug Use Yes   Types: Methamphetamines   Comment: states he uses 1 g every other day    Social History   Socioeconomic History   Marital status: Single    Spouse name: Not on file   Number of children: 1   Years of education: 10   Highest education level: 10th grade  Occupational History   Not on file  Tobacco Use   Smoking status: Every Day    Packs/day: 0.50    Years: 8.00    Pack years: 4.00    Types: Cigarettes   Smokeless tobacco: Never  Vaping Use   Vaping Use: Every day   Substances: Nicotine, Flavoring  Substance and Sexual Activity   Alcohol use: Not Currently  Alcohol/week: 13.0 standard drinks    Types: 6 Cans of beer, 7 Standard drinks or equivalent per week    Comment: previously heavy drinker 2016. most recent 4 beer/ day drinker and none since 01-11-2020   Drug use: Yes    Types: Methamphetamines    Comment: states he uses 1 g every other day   Sexual activity: Not on file  Other Topics Concern   Not on file  Social History Narrative   Not on file   Social Determinants of Health   Financial Resource Strain: Not on file  Food Insecurity: Not on file  Transportation Needs: Not on file  Physical Activity: Not on file  Stress: Not on file  Social Connections: Not on file   SDOH:  SDOH Screenings   Alcohol  Screen: Low Risk    Last Alcohol Screening Score (AUDIT): 1  Depression (PHQ2-9): Medium Risk   PHQ-2 Score: 16  Financial Resource Strain: Not on file  Food Insecurity: Not on file  Housing: Not on file  Physical Activity: Not on file  Social Connections: Not on file  Stress: Not on file  Tobacco Use: High Risk   Smoking Tobacco Use: Every Day   Smokeless Tobacco Use: Never   Passive Exposure: Not on file  Transportation Needs: Not on file   Additional Social History:         Sleep: Good  Appetite:  Good  Current Medications:  Current Facility-Administered Medications  Medication Dose Route Frequency Provider Last Rate Last Admin   acetaminophen (TYLENOL) tablet 650 mg  650 mg Oral Q6H PRN Rankin, Shuvon B, NP   650 mg at 04/18/21 2109   albuterol (VENTOLIN HFA) 108 (90 Base) MCG/ACT inhaler 2 puff  2 puff Inhalation Q4H PRN Jaclyn Shaggy, PA-C   2 puff at 04/19/21 0449   alum & mag hydroxide-simeth (MAALOX/MYLANTA) 200-200-20 MG/5ML suspension 30 mL  30 mL Oral Q4H PRN Rankin, Shuvon B, NP       hydrOXYzine (ATARAX) tablet 25 mg  25 mg Oral TID PRN Rankin, Shuvon B, NP       magnesium hydroxide (MILK OF MAGNESIA) suspension 30 mL  30 mL Oral Daily PRN Rankin, Shuvon B, NP       QUEtiapine (SEROQUEL) tablet 25 mg  25 mg Oral QHS Estella Husk, MD   25 mg at 04/18/21 2108   sertraline (ZOLOFT) tablet 50 mg  50 mg Oral QHS Melbourne Abts W, PA-C   50 mg at 04/18/21 2108   sodium chloride (OCEAN) 0.65 % nasal spray 1 spray  1 spray Each Nare PRN Jaclyn Shaggy, PA-C       traZODone (DESYREL) tablet 50 mg  50 mg Oral QHS PRN Melbourne Abts W, PA-C   50 mg at 04/17/21 2107   Current Outpatient Medications  Medication Sig Dispense Refill   albuterol (VENTOLIN HFA) 108 (90 Base) MCG/ACT inhaler Inhale 2 puffs into the lungs every 4 (four) hours as needed for wheezing or shortness of breath. (Patient not taking: Reported on 02/12/2021) 6.7 g 1   sertraline (ZOLOFT) 50 MG tablet  Take 1 tablet (50 mg total) by mouth at bedtime. (Patient not taking: Reported on 04/15/2021) 7 tablet 0    Labs  Lab Results:  Admission on 04/14/2021, Discharged on 04/15/2021  Component Date Value Ref Range Status   SARS Coronavirus 2 by RT PCR 04/14/2021 NEGATIVE  NEGATIVE Final   Comment: (NOTE) SARS-CoV-2 target nucleic acids are NOT DETECTED.  The SARS-CoV-2 RNA is generally detectable in upper respiratory specimens during the acute phase of infection. The lowest concentration of SARS-CoV-2 viral copies this assay can detect is 138 copies/mL. A negative result does not preclude SARS-Cov-2 infection and should not be used as the sole basis for treatment or other patient management decisions. A negative result may occur with  improper specimen collection/handling, submission of specimen other than nasopharyngeal swab, presence of viral mutation(s) within the areas targeted by this assay, and inadequate number of viral copies(<138 copies/mL). A negative result must be combined with clinical observations, patient history, and epidemiological information. The expected result is Negative.  Fact Sheet for Patients:  BloggerCourse.com  Fact Sheet for Healthcare Providers:  SeriousBroker.it  This test is no                          t yet approved or cleared by the [place] FDA and  has been authorized for detection and/or diagnosis of SARS-CoV-2 by FDA under an Emergency Use Authorization (EUA). This EUA will remain  in effect (meaning this test can be used) for the duration of the COVID-19 declaration under Section 564(b)(1) of the Act, 21 U.S.C.section 360bbb-3(b)(1), unless the authorization is terminated  or revoked sooner.       Influenza A by PCR 04/14/2021 NEGATIVE  NEGATIVE Final   Influenza B by PCR 04/14/2021 NEGATIVE  NEGATIVE Final   Comment: (NOTE) The Xpert Xpress SARS-CoV-2/FLU/RSV plus assay is intended as  an aid in the diagnosis of influenza from Nasopharyngeal swab specimens and should not be used as a sole basis for treatment. Nasal washings and aspirates are unacceptable for Xpert Xpress SARS-CoV-2/FLU/RSV testing.  Fact Sheet for Patients: BloggerCourse.com  Fact Sheet for Healthcare Providers: SeriousBroker.it  This test is not yet approved or cleared by the [place] FDA and has been authorized for detection and/or diagnosis of SARS-CoV-2 by FDA under an Emergency Use Authorization (EUA). This EUA will remain in effect (meaning this test can be used) for the duration of the COVID-19 declaration under Section 564(b)(1) of the Act, 21 U.S.C. section 360bbb-3(b)(1), unless the authorization is terminated or revoked.  Performed at [facility], [address] [place], [place] [address]    Sodium 04/14/2021 134 (L)  135 - 145 mmol/L Final   Potassium 04/14/2021 3.6  3.5 - 5.1 mmol/L Final   Chloride 04/14/2021 94 (L)  98 - 111 mmol/L Final   CO2 04/14/2021 28  22 - 32 mmol/L Final   Glucose, Bld 04/14/2021 119 (H)  70 - 99 mg/dL Final   Glucose reference range applies only to samples taken after fasting for at least 8 hours.   BUN 04/14/2021 14  6 - 20 mg/dL Final   Creatinine, Ser 04/14/2021 0.77  0.61 - 1.24 mg/dL Final   Calcium 08/65/7846 9.7  8.9 - 10.3 mg/dL Final   Total Protein 96/29/5284 8.8 (H)  6.5 - 8.1 g/dL Final   Albumin 13/24/4010 4.4  3.5 - 5.0 g/dL Final   AST 27/25/3664 47 (H)  15 - 41 U/L Final   ALT 04/14/2021 46 (H)  0 - 44 U/L Final   Alkaline Phosphatase 04/14/2021 71  38 - 126 U/L Final   Total Bilirubin 04/14/2021 1.0  0.3 - 1.2 mg/dL Final   GFR, Estimated 04/14/2021 >60  >60 mL/min Final   Comment: (NOTE) Calculated using the CKD-EPI Creatinine Equation (2021)    Anion gap 04/14/2021 12  5 -  15 Final   Performed at [facility], [address] [place], [place] [address]   Alcohol, Ethyl (B)  04/14/2021 <10  <10 mg/dL Final   Comment: (NOTE) Lowest detectable limit for serum alcohol is 10 mg/dL.  For medical purposes only. Performed at [facility], [address] [place], [place] [address]    Salicylate Lvl 04/14/2021 <7.0 (L)  7.0 - 30.0 mg/dL Final   Performed at [facility], [address] [place], [place] [address]   Acetaminophen (Tylenol), Serum 04/14/2021 <10 (L)  10 - 30 ug/mL Final   Comment: (NOTE) Therapeutic concentrations vary significantly. A range of 10-30 ug/mL  may be an effective concentration for many patients. However, some  are best treated at concentrations outside of this range. Acetaminophen concentrations >150 ug/mL at 4 hours after ingestion  and >50 ug/mL at 12 hours after ingestion are often associated with  toxic reactions.  Performed at [facility], [address] [place], [place] [address]    WBC 04/14/2021 12.6 (H)  4.0 - 10.5 K/uL Final   RBC 04/14/2021 5.35  4.22 - 5.81 MIL/uL Final   Hemoglobin 04/14/2021 15.7  13.0 - 17.0 g/dL Final   HCT 53/66/4403 46.9  39.0 - 52.0 % Final   MCV 04/14/2021 87.7  80.0 - 100.0 fL Final   MCH 04/14/2021 29.3  26.0 - 34.0 pg Final   MCHC 04/14/2021 33.5  30.0 - 36.0 g/dL Final   RDW 47/42/5956 13.5  11.5 - 15.5 % Final   Platelets 04/14/2021 481 (H)  150 - 400 K/uL Final   nRBC 04/14/2021 0.0  0.0 - 0.2 % Final   Performed at [facility], [address] [place], [place] [address]   Opiates 04/14/2021 NONE DETECTED  NONE DETECTED Final   Cocaine 04/14/2021 NONE DETECTED  NONE DETECTED Final   Benzodiazepines 04/14/2021 NONE DETECTED  NONE DETECTED Final   Amphetamines 04/14/2021 POSITIVE (A)  NONE DETECTED Final   Tetrahydrocannabinol 04/14/2021 NONE DETECTED  NONE DETECTED Final   Barbiturates 04/14/2021 NONE DETECTED  NONE DETECTED Final   Comment: (NOTE) DRUG SCREEN FOR MEDICAL PURPOSES ONLY.  IF CONFIRMATION IS NEEDED FOR ANY PURPOSE, NOTIFY LAB WITHIN 5  DAYS.  LOWEST DETECTABLE LIMITS FOR URINE DRUG SCREEN Drug Class                     Cutoff (ng/mL) Amphetamine and metabolites    1000 Barbiturate and metabolites    200 Benzodiazepine                 200 Tricyclics and metabolites     300 Opiates and metabolites        300 Cocaine and metabolites        300 THC                            50 Performed at [facility], [address] [place], [place] [address]   Admission on 04/05/2021, Discharged on 04/05/2021  Component Date Value Ref Range Status   Lipase 04/05/2021 24  11 - 51 U/L Final   Performed at [facility], [address] [place], [place] [address]   Sodium 04/05/2021 140  135 - 145 mmol/L Final   Potassium 04/05/2021 3.8  3.5 - 5.1 mmol/L Final   Chloride 04/05/2021 105  98 - 111 mmol/L Final   CO2 04/05/2021 28  22 - 32 mmol/L Final  Glucose, Bld 04/05/2021 88  70 - 99 mg/dL Final   Glucose reference range applies only to samples taken after fasting for at least 8 hours.   BUN 04/05/2021 10  6 - 20 mg/dL Final   Creatinine, Ser 04/05/2021 0.85  0.61 - 1.24 mg/dL Final   Calcium 93/71/6967 9.9  8.9 - 10.3 mg/dL Final   Total Protein 89/38/1017 7.9  6.5 - 8.1 g/dL Final   Albumin 51/05/5850 4.6  3.5 - 5.0 g/dL Final   AST 77/82/4235 13 (L)  15 - 41 U/L Final   ALT 04/05/2021 11  0 - 44 U/L Final   Alkaline Phosphatase 04/05/2021 65  38 - 126 U/L Final   Total Bilirubin 04/05/2021 0.8  0.3 - 1.2 mg/dL Final   GFR, Estimated 04/05/2021 >60  >60 mL/min Final   Comment: (NOTE) Calculated using the CKD-EPI Creatinine Equation (2021)    Anion gap 04/05/2021 7  5 - 15 Final   Performed at [facility], [address], [place], [place] [address]   WBC 04/05/2021 6.5  4.0 - 10.5 K/uL Final   RBC 04/05/2021 5.25  4.22 - 5.81 MIL/uL Final   Hemoglobin 04/05/2021 14.6  13.0 - 17.0 g/dL Final   HCT 31/54/0086 45.2  39.0 - 52.0 % Final   MCV 04/05/2021 86.1  80.0 - 100.0 fL Final    MCH 04/05/2021 27.8  26.0 - 34.0 pg Final   MCHC 04/05/2021 32.3  30.0 - 36.0 g/dL Final   RDW 76/19/5093 13.9  11.5 - 15.5 % Final   Platelets 04/05/2021 324  150 - 400 K/uL Final   nRBC 04/05/2021 0.0  0.0 - 0.2 % Final   Performed at [facility], [address] [place], [place] [address]   SARS Coronavirus 2 by RT PCR 04/05/2021 NEGATIVE  NEGATIVE Final   Comment: (NOTE) SARS-CoV-2 target nucleic acids are NOT DETECTED.  The SARS-CoV-2 RNA is generally detectable in upper respiratory specimens during the acute phase of infection. The lowest concentration of SARS-CoV-2 viral copies this assay can detect is 138 copies/mL. A negative result does not preclude SARS-Cov-2 infection and should not be used as the sole basis for treatment or other patient management decisions. A negative result may occur with  improper specimen collection/handling, submission of specimen other than nasopharyngeal swab, presence of viral mutation(s) within the areas targeted by this assay, and inadequate number of viral copies(<138 copies/mL). A negative result must be combined with clinical observations, patient history, and epidemiological information. The expected result is Negative.  Fact Sheet for Patients:  BloggerCourse.com  Fact Sheet for Healthcare Providers:  SeriousBroker.it  This test is no                          t yet approved or cleared by the [place] FDA and  has been authorized for detection and/or diagnosis of SARS-CoV-2 by FDA under an Emergency Use Authorization (EUA). This EUA will remain  in effect (meaning this test can be used) for the duration of the COVID-19 declaration under Section 564(b)(1) of the Act, 21 U.S.C.section 360bbb-3(b)(1), unless the authorization is terminated  or revoked sooner.       Influenza A by PCR 04/05/2021 NEGATIVE  NEGATIVE Final   Influenza B by PCR 04/05/2021 NEGATIVE   NEGATIVE Final   Comment: (NOTE) The Xpert Xpress SARS-CoV-2/FLU/RSV plus assay is intended as an aid in the diagnosis of influenza from Nasopharyngeal swab specimens and  should not be used as a sole basis for treatment. Nasal washings and aspirates are unacceptable for Xpert Xpress SARS-CoV-2/FLU/RSV testing.  Fact Sheet for Patients: BloggerCourse.com  Fact Sheet for Healthcare Providers: SeriousBroker.it  This test is not yet approved or cleared by the [place] FDA and has been authorized for detection and/or diagnosis of SARS-CoV-2 by FDA under an Emergency Use Authorization (EUA). This EUA will remain in effect (meaning this test can be used) for the duration of the COVID-19 declaration under Section 564(b)(1) of the Act, 21 U.S.C. section 360bbb-3(b)(1), unless the authorization is terminated or revoked.  Performed at [facility], [address] [place], [place] [address]   Admission on 02/22/2021, Discharged on 02/22/2021  Component Date Value Ref Range Status   Sodium 02/22/2021 133 (L)  135 - 145 mmol/L Final   Potassium 02/22/2021 3.8  3.5 - 5.1 mmol/L Final   Chloride 02/22/2021 93 (L)  98 - 111 mmol/L Final   CO2 02/22/2021 30  22 - 32 mmol/L Final   Glucose, Bld 02/22/2021 116 (H)  70 - 99 mg/dL Final   Glucose reference range applies only to samples taken after fasting for at least 8 hours.   BUN 02/22/2021 26 (H)  6 - 20 mg/dL Final   Creatinine, Ser 02/22/2021 0.95  0.61 - 1.24 mg/dL Final   Calcium 60/45/4098 9.7  8.9 - 10.3 mg/dL Final   Total Protein 11/91/4782 8.6 (H)  6.5 - 8.1 g/dL Final   Albumin 95/62/1308 4.7  3.5 - 5.0 g/dL Final   AST 65/78/4696 51 (H)  15 - 41 U/L Final   ALT 02/22/2021 34  0 - 44 U/L Final   Alkaline Phosphatase 02/22/2021 70  38 - 126 U/L Final   Total Bilirubin 02/22/2021 2.6 (H)  0.3 - 1.2 mg/dL Final   GFR, Estimated 02/22/2021 >60  >60 mL/min Final    Comment: (NOTE) Calculated using the CKD-EPI Creatinine Equation (2021)    Anion gap 02/22/2021 10  5 - 15 Final   Performed at [facility], [address] [place], [place] [address]   Alcohol, Ethyl (B) 02/22/2021 <10  <10 mg/dL Final   Comment: (NOTE) Lowest detectable limit for serum alcohol is 10 mg/dL.  For medical purposes only. Performed at [facility], [address] [place], [place] [address]    WBC 02/22/2021 11.4 (H)  4.0 - 10.5 K/uL Final   RBC 02/22/2021 5.00  4.22 - 5.81 MIL/uL Final   Hemoglobin 02/22/2021 14.6  13.0 - 17.0 g/dL Final   HCT 40/01/2724 41.4  39.0 - 52.0 % Final   MCV 02/22/2021 82.8  80.0 - 100.0 fL Final   MCH 02/22/2021 29.2  26.0 - 34.0 pg Final   MCHC 02/22/2021 35.3  30.0 - 36.0 g/dL Final   RDW 36/64/4034 13.1  11.5 - 15.5 % Final   Platelets 02/22/2021 399  150 - 400 K/uL Final   nRBC 02/22/2021 0.0  0.0 - 0.2 % Final   Performed at [facility], [address] [place], [place] [address]   Tricyclic, Ur Screen 02/22/2021 NONE DETECTED  NONE DETECTED Final   Amphetamines, Ur Screen 02/22/2021 POSITIVE (A)  NONE DETECTED Final   MDMA (Ecstasy)Ur Screen 02/22/2021 NONE DETECTED  NONE DETECTED Final   Cocaine Metabolite,Ur [place] 02/22/2021 NONE DETECTED  NONE DETECTED Final   Opiate, Ur Screen 02/22/2021 NONE DETECTED  NONE DETECTED Final   Phencyclidine (PCP) Ur S 02/22/2021 NONE DETECTED  NONE DETECTED  Final   Cannabinoid 50 Ng, Ur  02/22/2021 POSITIVE (A)  NONE DETECTED Final   Barbiturates, Ur Screen 02/22/2021 NONE DETECTED  NONE DETECTED Final   Benzodiazepine, Ur Scrn 02/22/2021 NONE DETECTED  NONE DETECTED Final   Methadone Scn, Ur 02/22/2021 NONE DETECTED  NONE DETECTED Final   Comment: (NOTE) Tricyclics + metabolites, urine    Cutoff 1000 ng/mL Amphetamines + metabolites, urine  Cutoff 1000 ng/mL MDMA (Ecstasy), urine              Cutoff 500 ng/mL Cocaine Metabolite, urine          Cutoff 300  ng/mL Opiate + metabolites, urine        Cutoff 300 ng/mL Phencyclidine (PCP), urine         Cutoff 25 ng/mL Cannabinoid, urine                 Cutoff 50 ng/mL Barbiturates + metabolites, urine  Cutoff 200 ng/mL Benzodiazepine, urine              Cutoff 200 ng/mL Methadone, urine                   Cutoff 300 ng/mL  The urine drug screen provides only a preliminary, unconfirmed analytical test result and should not be used for non-medical purposes. Clinical consideration and professional judgment should be applied to any positive drug screen result due to possible interfering substances. A more specific alternate chemical method must be used in order to obtain a confirmed analytical result. Gas chromatography / mass spectrometry (GC/MS) is the preferred confirm                          atory method. Performed at [facility], [address] [place], [place] [address]   Admission on 02/18/2021, Discharged on 02/18/2021  Component Date Value Ref Range Status   WBC 02/18/2021 15.0 (H)  4.0 - 10.5 K/uL Final   RBC 02/18/2021 5.61  4.22 - 5.81 MIL/uL Final   Hemoglobin 02/18/2021 15.9  13.0 - 17.0 g/dL Final   HCT 60/45/4098 46.8  39.0 - 52.0 % Final   MCV 02/18/2021 83.4  80.0 - 100.0 fL Final   MCH 02/18/2021 28.3  26.0 - 34.0 pg Final   MCHC 02/18/2021 34.0  30.0 - 36.0 g/dL Final   RDW 11/91/4782 13.3  11.5 - 15.5 % Final   Platelets 02/18/2021 430 (H)  150 - 400 K/uL Final   nRBC 02/18/2021 0.0  0.0 - 0.2 % Final   Neutrophils Relative % 02/18/2021 74  % Final   Neutro Abs 02/18/2021 11.1 (H)  1.7 - 7.7 K/uL Final   Lymphocytes Relative 02/18/2021 17  % Final   Lymphs Abs 02/18/2021 2.5  0.7 - 4.0 K/uL Final   Monocytes Relative 02/18/2021 9  % Final   Monocytes Absolute 02/18/2021 1.3 (H)  0.1 - 1.0 K/uL Final   Eosinophils Relative 02/18/2021 0  % Final   Eosinophils Absolute 02/18/2021 0.0  0.0 - 0.5 K/uL Final   Basophils Relative 02/18/2021 0  % Final    Basophils Absolute 02/18/2021 0.1  0.0 - 0.1 K/uL Final   Immature Granulocytes 02/18/2021 0  % Final   Abs Immature Granulocytes 02/18/2021 0.04  0.00 - 0.07 K/uL Final   Performed at [facility], [address] [place], [place] [address]   Sodium 02/18/2021 136  135 - 145 mmol/L Final   Potassium 02/18/2021 3.8  3.5 - 5.1 mmol/L Final   Chloride 02/18/2021 99  98 - 111 mmol/L Final   CO2 02/18/2021 24  22 - 32 mmol/L Final   Glucose, Bld 02/18/2021 139 (H)  70 - 99 mg/dL Final   Glucose reference range applies only to samples taken after fasting for at least 8 hours.   BUN 02/18/2021 18  6 - 20 mg/dL Final   Creatinine, Ser 02/18/2021 1.26 (H)  0.61 - 1.24 mg/dL Final   Calcium 38/75/6433 10.3  8.9 - 10.3 mg/dL Final   Total Protein 29/51/8841 9.7 (H)  6.5 - 8.1 g/dL Final   Albumin 66/09/3014 5.2 (H)  3.5 - 5.0 g/dL Final   AST 05/05/3233 52 (H)  15 - 41 U/L Final   ALT 02/18/2021 24  0 - 44 U/L Final   Alkaline Phosphatase 02/18/2021 83  38 - 126 U/L Final   Total Bilirubin 02/18/2021 1.9 (H)  0.3 - 1.2 mg/dL Final   GFR, Estimated 02/18/2021 >60  >60 mL/min Final   Comment: (NOTE) Calculated using the CKD-EPI Creatinine Equation (2021)    Anion gap 02/18/2021 13  5 - 15 Final   Performed at [facility], [address] [place], [place] [address]   Lipase 02/18/2021 24  11 - 51 U/L Final   Performed at [facility], [address] [place], [place] [address]   SARS Coronavirus 2 by RT PCR 02/18/2021 NEGATIVE  NEGATIVE Final   Comment: (NOTE) SARS-CoV-2 target nucleic acids are NOT DETECTED.  The SARS-CoV-2 RNA is generally detectable in upper respiratory specimens during the acute phase of infection. The lowest concentration of SARS-CoV-2 viral copies this assay can detect is 138 copies/mL. A negative result does not preclude SARS-Cov-2 infection and should not be used as the sole basis for treatment or other patient management decisions. A  negative result may occur with  improper specimen collection/handling, submission of specimen other than nasopharyngeal swab, presence of viral mutation(s) within the areas targeted by this assay, and inadequate number of viral copies(<138 copies/mL). A negative result must be combined with clinical observations, patient history, and epidemiological information. The expected result is Negative.  Fact Sheet for Patients:  BloggerCourse.com  Fact Sheet for Healthcare Providers:  SeriousBroker.it  This test is no                          t yet approved or cleared by the [place] FDA and  has been authorized for detection and/or diagnosis of SARS-CoV-2 by FDA under an Emergency Use Authorization (EUA). This EUA will remain  in effect (meaning this test can be used) for the duration of the COVID-19 declaration under Section 564(b)(1) of the Act, 21 U.S.C.section 360bbb-3(b)(1), unless the authorization is terminated  or revoked sooner.       Influenza A by PCR 02/18/2021 NEGATIVE  NEGATIVE Final   Influenza B by PCR 02/18/2021 NEGATIVE  NEGATIVE Final   Comment: (NOTE) The Xpert Xpress SARS-CoV-2/FLU/RSV plus assay is intended as an aid in the diagnosis of influenza from Nasopharyngeal swab specimens and should not be used as a sole basis for treatment. Nasal washings and aspirates are unacceptable for Xpert Xpress SARS-CoV-2/FLU/RSV testing.  Fact Sheet for Patients: BloggerCourse.com  Fact Sheet for Healthcare Providers: SeriousBroker.it  This test is not yet approved or cleared by the [place] FDA and has been authorized for detection and/or diagnosis of SARS-CoV-2 by FDA under an Emergency Use Authorization (EUA).  This EUA will remain in effect (meaning this test can be used) for the duration of the COVID-19 declaration under Section 564(b)(1) of the Act, 21  U.S.C. section 360bbb-3(b)(1), unless the authorization is terminated or revoked.  Performed at [facility], [address] [place], [place] [address]   Admission on 02/13/2021, Discharged on 02/14/2021  Component Date Value Ref Range Status   SARS Coronavirus 2 by RT PCR 02/13/2021 NEGATIVE  NEGATIVE Final   Comment: (NOTE) SARS-CoV-2 target nucleic acids are NOT DETECTED.  The SARS-CoV-2 RNA is generally detectable in upper respiratory specimens during the acute phase of infection. The lowest concentration of SARS-CoV-2 viral copies this assay can detect is 138 copies/mL. A negative result does not preclude SARS-Cov-2 infection and should not be used as the sole basis for treatment or other patient management decisions. A negative result may occur with  improper specimen collection/handling, submission of specimen other than nasopharyngeal swab, presence of viral mutation(s) within the areas targeted by this assay, and inadequate number of viral copies(<138 copies/mL). A negative result must be combined with clinical observations, patient history, and epidemiological information. The expected result is Negative.  Fact Sheet for Patients:  BloggerCourse.com  Fact Sheet for Healthcare Providers:  SeriousBroker.it  This test is no                          t yet approved or cleared by the [place] FDA and  has been authorized for detection and/or diagnosis of SARS-CoV-2 by FDA under an Emergency Use Authorization (EUA). This EUA will remain  in effect (meaning this test can be used) for the duration of the COVID-19 declaration under Section 564(b)(1) of the Act, 21 U.S.C.section 360bbb-3(b)(1), unless the authorization is terminated  or revoked sooner.       Influenza A by PCR 02/13/2021 NEGATIVE  NEGATIVE Final   Influenza B by PCR 02/13/2021 NEGATIVE  NEGATIVE Final   Comment: (NOTE) The Xpert Xpress  SARS-CoV-2/FLU/RSV plus assay is intended as an aid in the diagnosis of influenza from Nasopharyngeal swab specimens and should not be used as a sole basis for treatment. Nasal washings and aspirates are unacceptable for Xpert Xpress SARS-CoV-2/FLU/RSV testing.  Fact Sheet for Patients: BloggerCourse.com  Fact Sheet for Healthcare Providers: SeriousBroker.it  This test is not yet approved or cleared by the [place] FDA and has been authorized for detection and/or diagnosis of SARS-CoV-2 by FDA under an Emergency Use Authorization (EUA). This EUA will remain in effect (meaning this test can be used) for the duration of the COVID-19 declaration under Section 564(b)(1) of the Act, 21 U.S.C. section 360bbb-3(b)(1), unless the authorization is terminated or revoked.  Performed at [facility], [address], [place], [place] [address]    Alcohol, Ethyl (B) 02/13/2021 <10  <10 mg/dL Final   Comment: (NOTE) Lowest detectable limit for serum alcohol is 10 mg/dL.  For medical purposes only. Performed at [facility], [address] [place], [place] [address]    POC Amphetamine UR 02/13/2021 Positive (A)  NONE DETECTED (Cut Off Level 1000 ng/mL) Final   POC Secobarbital (BAR) 02/13/2021 None Detected  NONE DETECTED (Cut Off Level 300 ng/mL) Final   POC Buprenorphine (BUP) 02/13/2021 None Detected  NONE DETECTED (Cut Off Level 10 ng/mL) Final   POC Oxazepam (BZO) 02/13/2021 None Detected  NONE DETECTED (Cut Off Level 300 ng/mL) Final   POC Cocaine UR 02/13/2021 None Detected  NONE  DETECTED (Cut Off Level 300 ng/mL) Final   POC Methamphetamine UR 02/13/2021 Positive (A)  NONE DETECTED (Cut Off Level 1000 ng/mL) Final   POC Morphine 02/13/2021 None Detected  NONE DETECTED (Cut Off Level 300 ng/mL) Final   POC Oxycodone UR 02/13/2021 None Detected  NONE DETECTED (Cut Off Level 100 ng/mL) Final   POC Methadone UR 02/13/2021  None Detected  NONE DETECTED (Cut Off Level 300 ng/mL) Final   POC Marijuana UR 02/13/2021 None Detected  NONE DETECTED (Cut Off Level 50 ng/mL) Final   SARSCOV2ONAVIRUS 2 AG 02/13/2021 NEGATIVE  NEGATIVE Final   Comment: (NOTE) SARS-CoV-2 antigen NOT DETECTED.   Negative results are presumptive.  Negative results do not preclude SARS-CoV-2 infection and should not be used as the sole basis for treatment or other patient management decisions, including infection  control decisions, particularly in the presence of clinical signs and  symptoms consistent with COVID-19, or in those who have been in contact with the virus.  Negative results must be combined with clinical observations, patient history, and epidemiological information. The expected result is Negative.  Fact Sheet for Patients: https://www.jennings-kim.com/  Fact Sheet for Healthcare Providers: https://alexander-rogers.biz/  This test is not yet approved or cleared by the [place] FDA and  has been authorized for detection and/or diagnosis of SARS-CoV-2 by FDA under an Emergency Use Authorization (EUA).  This EUA will remain in effect (meaning this test can be used) for the duration of  the COV                          ID-19 declaration under Section 564(b)(1) of the Act, 21 U.S.C. section 360bbb-3(b)(1), unless the authorization is terminated or revoked sooner.    Admission on 02/11/2021, Discharged on 02/12/2021  Component Date Value Ref Range Status   Sodium 02/11/2021 140  135 - 145 mmol/L Final   Potassium 02/11/2021 4.1  3.5 - 5.1 mmol/L Final   Chloride 02/11/2021 103  98 - 111 mmol/L Final   CO2 02/11/2021 24  22 - 32 mmol/L Final   Glucose, Bld 02/11/2021 89  70 - 99 mg/dL Final   Glucose reference range applies only to samples taken after fasting for at least 8 hours.   BUN 02/11/2021 12  6 - 20 mg/dL Final   Creatinine, Ser 02/11/2021 0.89  0.61 - 1.24 mg/dL Final   Calcium  16/01/9603 10.2  8.9 - 10.3 mg/dL Final   Total Protein 54/12/8117 8.4 (H)  6.5 - 8.1 g/dL Final   Albumin 14/78/2956 4.6  3.5 - 5.0 g/dL Final   AST 21/30/8657 40  15 - 41 U/L Final   ALT 02/11/2021 19  0 - 44 U/L Final   Alkaline Phosphatase 02/11/2021 83  38 - 126 U/L Final   Total Bilirubin 02/11/2021 1.7 (H)  0.3 - 1.2 mg/dL Final   GFR, Estimated 02/11/2021 >60  >60 mL/min Final   Comment: (NOTE) Calculated using the CKD-EPI Creatinine Equation (2021)    Anion gap 02/11/2021 13  5 - 15 Final   Performed at [facility], [address] [place], [place] [address]   Alcohol, Ethyl (B) 02/11/2021 <10  <10 mg/dL Final   Comment: (NOTE) Lowest detectable limit for serum alcohol is 10 mg/dL.  For medical purposes only. Performed at [facility], [address], [place], [place] [address]    Salicylate Lvl 02/11/2021 <7.0 (L)  7.0 - 30.0 mg/dL Final  Performed at [facility], [address], [place], [place] [address]   Acetaminophen (Tylenol), Serum 02/11/2021 <10 (L)  10 - 30 ug/mL Final   Comment: (NOTE) Therapeutic concentrations vary significantly. A range of 10-30 ug/mL  may be an effective concentration for many patients. However, some  are best treated at concentrations outside of this range. Acetaminophen concentrations >150 ug/mL at 4 hours after ingestion  and >50 ug/mL at 12 hours after ingestion are often associated with  toxic reactions.  Performed at [facility], [address], [place], [place] [address]    WBC 02/11/2021 11.6 (H)  4.0 - 10.5 K/uL Final   RBC 02/11/2021 5.47  4.22 - 5.81 MIL/uL Final   Hemoglobin 02/11/2021 15.5  13.0 - 17.0 g/dL Final   HCT 09/81/1914 46.5  39.0 - 52.0 % Final   MCV 02/11/2021 85.0  80.0 - 100.0 fL Final   MCH 02/11/2021 28.3  26.0 - 34.0 pg Final   MCHC 02/11/2021 33.3  30.0 - 36.0 g/dL Final   RDW 78/29/5621 13.8  11.5 - 15.5 % Final   Platelets 02/11/2021 337  150 - 400 K/uL Final   nRBC  02/11/2021 0.0  0.0 - 0.2 % Final   Performed at [facility], [address], [place], [place] [address]   Opiates 02/11/2021 NONE DETECTED  NONE DETECTED Final   Cocaine 02/11/2021 NONE DETECTED  NONE DETECTED Final   Benzodiazepines 02/11/2021 NONE DETECTED  NONE DETECTED Final   Amphetamines 02/11/2021 POSITIVE (A)  NONE DETECTED Final   Tetrahydrocannabinol 02/11/2021 NONE DETECTED  NONE DETECTED Final   Barbiturates 02/11/2021 NONE DETECTED  NONE DETECTED Final   Comment: (NOTE) DRUG SCREEN FOR MEDICAL PURPOSES ONLY.  IF CONFIRMATION IS NEEDED FOR ANY PURPOSE, NOTIFY LAB WITHIN 5 DAYS.  LOWEST DETECTABLE LIMITS FOR URINE DRUG SCREEN Drug Class                     Cutoff (ng/mL) Amphetamine and metabolites    1000 Barbiturate and metabolites    200 Benzodiazepine                 200 Tricyclics and metabolites     300 Opiates and metabolites        300 Cocaine and metabolites        300 THC                            50 Performed at [facility], [address], [place], [place] [address]    SARS Coronavirus 2 by RT PCR 02/11/2021 NEGATIVE  NEGATIVE Final   Comment: (NOTE) SARS-CoV-2 target nucleic acids are NOT DETECTED.  The SARS-CoV-2 RNA is generally detectable in upper respiratory specimens during the acute phase of infection. The lowest concentration of SARS-CoV-2 viral copies this assay can detect is 138 copies/mL. A negative result does not preclude SARS-Cov-2 infection and should not be used as the sole basis for treatment or other patient management decisions. A negative result may occur with  improper specimen collection/handling, submission of specimen other than nasopharyngeal swab, presence of viral mutation(s) within the areas targeted by this assay, and inadequate number of viral copies(<138 copies/mL). A negative result must be combined with clinical observations, patient history, and epidemiological information. The expected result is  Negative.  Fact Sheet for Patients:  BloggerCourse.com  Fact Sheet for Healthcare Providers:  SeriousBroker.it  This test  is no                          t yet approved or cleared by the [place] and  has been authorized for detection and/or diagnosis of SARS-CoV-2 by FDA under an Emergency Use Authorization (EUA). This EUA will remain  in effect (meaning this test can be used) for the duration of the COVID-19 declaration under Section 564(b)(1) of the Act, 21 U.S.C.section 360bbb-3(b)(1), unless the authorization is terminated  or revoked sooner.       Influenza A by PCR 02/11/2021 NEGATIVE  NEGATIVE Final   Influenza B by PCR 02/11/2021 NEGATIVE  NEGATIVE Final   Comment: (NOTE) The Xpert Xpress SARS-CoV-2/FLU/RSV plus assay is intended as an aid in the diagnosis of influenza from Nasopharyngeal swab specimens and should not be used as a sole basis for treatment. Nasal washings and aspirates are unacceptable for Xpert Xpress SARS-CoV-2/FLU/RSV testing.  Fact Sheet for Patients: BloggerCourse.com  Fact Sheet for Healthcare Providers: SeriousBroker.it  This test is not yet approved or cleared by the [place] FDA and has been authorized for detection and/or diagnosis of SARS-CoV-2 by FDA under an Emergency Use Authorization (EUA). This EUA will remain in effect (meaning this test can be used) for the duration of the COVID-19 declaration under Section 564(b)(1) of the Act, 21 U.S.C. section 360bbb-3(b)(1), unless the authorization is terminated or revoked.  Performed at [facility], [address] [place], [place] [address]   Admission on 02/05/2021, Discharged on 02/05/2021  Component Date Value Ref Range Status   SARS Coronavirus 2 by RT PCR 02/05/2021 NEGATIVE  NEGATIVE Final   Comment: (NOTE) SARS-CoV-2 target nucleic acids are NOT DETECTED.  The  SARS-CoV-2 RNA is generally detectable in upper respiratory specimens during the acute phase of infection. The lowest concentration of SARS-CoV-2 viral copies this assay can detect is 138 copies/mL. A negative result does not preclude SARS-Cov-2 infection and should not be used as the sole basis for treatment or other patient management decisions. A negative result may occur with  improper specimen collection/handling, submission of specimen other than nasopharyngeal swab, presence of viral mutation(s) within the areas targeted by this assay, and inadequate number of viral copies(<138 copies/mL). A negative result must be combined with clinical observations, patient history, and epidemiological information. The expected result is Negative.  Fact Sheet for Patients:  BloggerCourse.com  Fact Sheet for Healthcare Providers:  SeriousBroker.it  This test is no                          t yet approved or cleared by the [place] FDA and  has been authorized for detection and/or diagnosis of SARS-CoV-2 by FDA under an Emergency Use Authorization (EUA). This EUA will remain  in effect (meaning this test can be used) for the duration of the COVID-19 declaration under Section 564(b)(1) of the Act, 21 U.S.C.section 360bbb-3(b)(1), unless the authorization is terminated  or revoked sooner.       Influenza A by PCR 02/05/2021 NEGATIVE  NEGATIVE Final   Influenza B by PCR 02/05/2021 NEGATIVE  NEGATIVE Final   Comment: (NOTE) The Xpert Xpress SARS-CoV-2/FLU/RSV plus assay is intended as an aid in the diagnosis of influenza from Nasopharyngeal swab specimens and should not be used as a sole basis for treatment. Nasal washings and aspirates are unacceptable for Xpert Xpress SARS-CoV-2/FLU/RSV testing.  Fact Sheet for Patients: BloggerCourse.com  Fact Sheet for Healthcare  Providers: SeriousBroker.it  This test is not yet approved or cleared by the [place] FDA and has been authorized for detection and/or diagnosis of SARS-CoV-2 by FDA under an Emergency Use Authorization (EUA). This EUA will remain in effect (meaning this test can be used) for the duration of the COVID-19 declaration under Section 564(b)(1) of the Act, 21 U.S.C. section 360bbb-3(b)(1), unless the authorization is terminated or revoked.  Performed at [facility], [address], [place], [place] [address]    SARS Coronavirus 2 Ag 02/05/2021 Negative  Negative Preliminary   WBC 02/05/2021 9.2  4.0 - 10.5 K/uL Final   RBC 02/05/2021 5.05  4.22 - 5.81 MIL/uL Final   Hemoglobin 02/05/2021 14.4  13.0 - 17.0 g/dL Final   HCT 60/45/4098 43.1  39.0 - 52.0 % Final   MCV 02/05/2021 85.3  80.0 - 100.0 fL Final   MCH 02/05/2021 28.5  26.0 - 34.0 pg Final   MCHC 02/05/2021 33.4  30.0 - 36.0 g/dL Final   RDW 11/91/4782 14.0  11.5 - 15.5 % Final   Platelets 02/05/2021 346  150 - 400 K/uL Final   nRBC 02/05/2021 0.0  0.0 - 0.2 % Final   Neutrophils Relative % 02/05/2021 59  % Final   Neutro Abs 02/05/2021 5.3  1.7 - 7.7 K/uL Final   Lymphocytes Relative 02/05/2021 29  % Final   Lymphs Abs 02/05/2021 2.7  0.7 - 4.0 K/uL Final   Monocytes Relative 02/05/2021 7  % Final   Monocytes Absolute 02/05/2021 0.7  0.1 - 1.0 K/uL Final   Eosinophils Relative 02/05/2021 5  % Final   Eosinophils Absolute 02/05/2021 0.5  0.0 - 0.5 K/uL Final   Basophils Relative 02/05/2021 0  % Final   Basophils Absolute 02/05/2021 0.0  0.0 - 0.1 K/uL Final   Immature Granulocytes 02/05/2021 0  % Final   Abs Immature Granulocytes 02/05/2021 0.03  0.00 - 0.07 K/uL Final   Performed at [facility], [address], [place], [place] [address]   Sodium 02/05/2021 138  135 - 145 mmol/L Final   Potassium 02/05/2021 4.0  3.5 - 5.1 mmol/L Final   Chloride 02/05/2021 102  98 - 111 mmol/L  Final   CO2 02/05/2021 25  22 - 32 mmol/L Final   Glucose, Bld 02/05/2021 88  70 - 99 mg/dL Final   Glucose reference range applies only to samples taken after fasting for at least 8 hours.   BUN 02/05/2021 15  6 - 20 mg/dL Final   Creatinine, Ser 02/05/2021 0.76  0.61 - 1.24 mg/dL Final   Calcium 30/86/5784 9.8  8.9 - 10.3 mg/dL Final   Total Protein 69/62/9528 7.5  6.5 - 8.1 g/dL Final   Albumin 41/32/4401 4.3  3.5 - 5.0 g/dL Final   AST 02/72/5366 21  15 - 41 U/L Final   ALT 02/05/2021 16  0 - 44 U/L Final   Alkaline Phosphatase 02/05/2021 76  38 - 126 U/L Final   Total Bilirubin 02/05/2021 1.0  0.3 - 1.2 mg/dL Final   GFR, Estimated 02/05/2021 >60  >60 mL/min Final   Comment: (NOTE) Calculated using the CKD-EPI Creatinine Equation (2021)    Anion gap 02/05/2021 11  5 - 15 Final   Performed at [facility], [address] [place], [place] [address]   Alcohol, Ethyl (B) 02/05/2021 <10  <10 mg/dL Final   Comment: (NOTE) Lowest detectable limit for serum alcohol  is 10 mg/dL.  For medical purposes only. Performed at [facility], [address] [place], [place] [address]    POC Amphetamine UR 02/05/2021 None Detected  NONE DETECTED (Cut Off Level 1000 ng/mL) Preliminary   POC Secobarbital (BAR) 02/05/2021 None Detected  NONE DETECTED (Cut Off Level 300 ng/mL) Preliminary   POC Buprenorphine (BUP) 02/05/2021 None Detected  NONE DETECTED (Cut Off Level 10 ng/mL) Preliminary   POC Oxazepam (BZO) 02/05/2021 None Detected  NONE DETECTED (Cut Off Level 300 ng/mL) Preliminary   POC Cocaine UR 02/05/2021 None Detected  NONE DETECTED (Cut Off Level 300 ng/mL) Preliminary   POC Methamphetamine UR 02/05/2021 None Detected  NONE DETECTED (Cut Off Level 1000 ng/mL) Preliminary   POC Morphine 02/05/2021 None Detected  NONE DETECTED (Cut Off Level 300 ng/mL) Preliminary   POC Oxycodone UR 02/05/2021 None Detected  NONE DETECTED (Cut Off Level 100 ng/mL) Preliminary   POC Methadone UR  02/05/2021 None Detected  NONE DETECTED (Cut Off Level 300 ng/mL) Preliminary   POC Marijuana UR 02/05/2021 None Detected  NONE DETECTED (Cut Off Level 50 ng/mL) Preliminary   TSH 02/05/2021 5.750 (H)  0.350 - 4.500 uIU/mL Final   Comment: Performed by a 3rd Generation assay with a functional sensitivity of <=0.01 uIU/mL. Performed at [facility], [address], [place], [place] [address]    SARSCOV2ONAVIRUS 2 AG 02/05/2021 NEGATIVE  NEGATIVE Final   Comment: (NOTE) SARS-CoV-2 antigen NOT DETECTED.   Negative results are presumptive.  Negative results do not preclude SARS-CoV-2 infection and should not be used as the sole basis for treatment or other patient management decisions, including infection  control decisions, particularly in the presence of clinical signs and  symptoms consistent with COVID-19, or in those who have been in contact with the virus.  Negative results must be combined with clinical observations, patient history, and epidemiological information. The expected result is Negative.  Fact Sheet for Patients: https://www.jennings-kim.com/  Fact Sheet for Healthcare Providers: https://alexander-rogers.biz/  This test is not yet approved or cleared by the [place] FDA and  has been authorized for detection and/or diagnosis of SARS-CoV-2 by FDA under an Emergency Use Authorization (EUA).  This EUA will remain in effect (meaning this test can be used) for the duration of  the COV                          ID-19 declaration under Section 564(b)(1) of the Act, 21 U.S.C. section 360bbb-3(b)(1), unless the authorization is terminated or revoked sooner.     Free T4 02/05/2021 0.80  0.61 - 1.12 ng/dL Final   Comment: (NOTE) Biotin ingestion may interfere with free T4 tests. If the results are inconsistent with the TSH level, previous test results, or the clinical presentation, then consider biotin interference. If needed, order repeat  testing after stopping biotin. Performed at [facility], [address], [place], [place] [address]    T3, Free 02/05/2021 3.8  2.0 - 4.4 pg/mL Final   Comment: (NOTE) Performed At: [facility] [address] [place], [place] [id number] Jolene Schimke MD OZ:[id number]   Admission on 02/04/2021, Discharged on 02/04/2021  Component Date Value Ref Range Status   SARS Coronavirus 2 by RT PCR 02/04/2021 NEGATIVE  NEGATIVE Final   Comment: (NOTE) SARS-CoV-2 target nucleic acids are NOT DETECTED.  The SARS-CoV-2 RNA is generally detectable in upper respiratory specimens during the acute phase of infection. The lowest concentration  of SARS-CoV-2 viral copies this assay can detect is 138 copies/mL. A negative result does not preclude SARS-Cov-2 infection and should not be used as the sole basis for treatment or other patient management decisions. A negative result may occur with  improper specimen collection/handling, submission of specimen other than nasopharyngeal swab, presence of viral mutation(s) within the areas targeted by this assay, and inadequate number of viral copies(<138 copies/mL). A negative result must be combined with clinical observations, patient history, and epidemiological information. The expected result is Negative.  Fact Sheet for Patients:  BloggerCourse.com  Fact Sheet for Healthcare Providers:  SeriousBroker.it  This test is no                          t yet approved or cleared by the [place] FDA and  has been authorized for detection and/or diagnosis of SARS-CoV-2 by FDA under an Emergency Use Authorization (EUA). This EUA will remain  in effect (meaning this test can be used) for the duration of the COVID-19 declaration under Section 564(b)(1) of the Act, 21 U.S.C.section 360bbb-3(b)(1), unless the authorization is terminated  or revoked sooner.       Influenza A by PCR 02/04/2021  NEGATIVE  NEGATIVE Final   Influenza B by PCR 02/04/2021 NEGATIVE  NEGATIVE Final   Comment: (NOTE) The Xpert Xpress SARS-CoV-2/FLU/RSV plus assay is intended as an aid in the diagnosis of influenza from Nasopharyngeal swab specimens and should not be used as a sole basis for treatment. Nasal washings and aspirates are unacceptable for Xpert Xpress SARS-CoV-2/FLU/RSV testing.  Fact Sheet for Patients: BloggerCourse.com  Fact Sheet for Healthcare Providers: SeriousBroker.it  This test is not yet approved or cleared by the [place] FDA and has been authorized for detection and/or diagnosis of SARS-CoV-2 by FDA under an Emergency Use Authorization (EUA). This EUA will remain in effect (meaning this test can be used) for the duration of the COVID-19 declaration under Section 564(b)(1) of the Act, 21 U.S.C. section 360bbb-3(b)(1), unless the authorization is terminated or revoked.  Performed at [facility], [address] [place], [place] [address]   Admission on 01/16/2021, Discharged on 01/24/2021  Component Date Value Ref Range Status   Cholesterol 01/17/2021 94  0 - 200 mg/dL Final   Triglycerides 19/14/7829 100  <150 mg/dL Final   HDL 56/21/3086 28 (L)  >40 mg/dL Final   Total CHOL/HDL Ratio 01/17/2021 3.4  RATIO Final   VLDL 01/17/2021 20  0 - 40 mg/dL Final   LDL Cholesterol 01/17/2021 46  0 - 99 mg/dL Final   Comment:        Total Cholesterol/HDL:CHD Risk Coronary Heart Disease Risk Table                     Men   Women  1/2 Average Risk   3.4   3.3  Average Risk       5.0   4.4  2 X Average Risk   9.6   7.1  3 X Average Risk  23.4   11.0        Use the calculated Patient Ratio above and the CHD Risk Table to determine the patient's CHD Risk.        ATP III CLASSIFICATION (LDL):  <100     mg/dL   Optimal  578-469  mg/dL   Near or Above  Optimal  130-159  mg/dL   Borderline  161-096   mg/dL   High  >045     mg/dL   Very High Performed at [facility], [address] [place], [place] [address]    Hgb A1c MFr Bld 01/17/2021 6.0 (H)  4.8 - 5.6 % Final   Comment: (NOTE)         Prediabetes: 5.7 - 6.4         Diabetes: >6.4         Glycemic control for adults with diabetes: <7.0    Mean Plasma Glucose 01/17/2021 126  mg/dL Final   Comment: (NOTE) Performed At: [facility] [address] [place], [place] [id number] Jolene Schimke MD AO:[id number]   Admission on 01/16/2021, Discharged on 01/16/2021  Component Date Value Ref Range Status   Sodium 01/16/2021 134 (L)  135 - 145 mmol/L Final   Potassium 01/16/2021 3.8  3.5 - 5.1 mmol/L Final   Chloride 01/16/2021 96 (L)  98 - 111 mmol/L Final   CO2 01/16/2021 27  22 - 32 mmol/L Final   Glucose, Bld 01/16/2021 79  70 - 99 mg/dL Final   Glucose reference range applies only to samples taken after fasting for at least 8 hours.   BUN 01/16/2021 8  6 - 20 mg/dL Final   Creatinine, Ser 01/16/2021 0.83  0.61 - 1.24 mg/dL Final   Calcium 96/29/5284 9.3  8.9 - 10.3 mg/dL Final   Total Protein 13/24/4010 7.6  6.5 - 8.1 g/dL Final   Albumin 27/25/3664 4.0  3.5 - 5.0 g/dL Final   AST 40/34/7425 22  15 - 41 U/L Final   ALT 01/16/2021 18  0 - 44 U/L Final   Alkaline Phosphatase 01/16/2021 58  38 - 126 U/L Final   Total Bilirubin 01/16/2021 0.8  0.3 - 1.2 mg/dL Final   GFR, Estimated 01/16/2021 >60  >60 mL/min Final   Comment: (NOTE) Calculated using the CKD-EPI Creatinine Equation (2021)    Anion gap 01/16/2021 11  5 - 15 Final   Performed at [facility], [address] [place], [place] [address]   Alcohol, Ethyl (B) 01/16/2021 <10  <10 mg/dL Final   Comment: (NOTE) Lowest detectable limit for serum alcohol is 10 mg/dL.  For medical purposes only. Performed at [facility], [address] [place], [place] [address]    Salicylate Lvl 01/16/2021 <7.0 (L)  7.0 - 30.0 mg/dL Final    Performed at [facility], [address] [place], [place] [address]   Acetaminophen (Tylenol), Serum 01/16/2021 <10 (L)  10 - 30 ug/mL Final   Comment: (NOTE) Therapeutic concentrations vary significantly. A range of 10-30 ug/mL  may be an effective concentration for many patients. However, some  are best treated at concentrations outside of this range. Acetaminophen concentrations >150 ug/mL at 4 hours after ingestion  and >50 ug/mL at 12 hours after ingestion are often associated with  toxic reactions.  Performed at [facility], [address] [place], [place] [address]    WBC 01/16/2021 8.8  4.0 - 10.5 K/uL Final   RBC 01/16/2021 4.89  4.22 - 5.81 MIL/uL Final   Hemoglobin 01/16/2021 14.4  13.0 - 17.0 g/dL Final   HCT 60/63/0160 41.7  39.0 - 52.0 % Final   MCV 01/16/2021 85.3  80.0 - 100.0 fL Final   MCH 01/16/2021 29.4  26.0 - 34.0 pg Final   MCHC 01/16/2021 34.5  30.0 - 36.0 g/dL Final  RDW 01/16/2021 13.5  11.5 - 15.5 % Final   Platelets 01/16/2021 402 (H)  150 - 400 K/uL Final   nRBC 01/16/2021 0.0  0.0 - 0.2 % Final   Performed at [facility], [address] [place], [place] [address]   Tricyclic, Ur Screen 01/16/2021 NONE DETECTED  NONE DETECTED Final   Amphetamines, Ur Screen 01/16/2021 POSITIVE (A)  NONE DETECTED Final   MDMA (Ecstasy)Ur Screen 01/16/2021 NONE DETECTED  NONE DETECTED Final   Cocaine Metabolite,Ur [place] 01/16/2021 NONE DETECTED  NONE DETECTED Final   Opiate, Ur Screen 01/16/2021 NONE DETECTED  NONE DETECTED Final   Phencyclidine (PCP) Ur S 01/16/2021 NONE DETECTED  NONE DETECTED Final   Cannabinoid 50 Ng, Ur [place] 01/16/2021 NONE DETECTED  NONE DETECTED Final   Barbiturates, Ur Screen 01/16/2021 NONE DETECTED  NONE DETECTED Final   Benzodiazepine, Ur Scrn 01/16/2021 NONE DETECTED  NONE DETECTED Final   Methadone Scn, Ur 01/16/2021 NONE DETECTED  NONE DETECTED Final   Comment: (NOTE) Tricyclics + metabolites, urine    Cutoff  1000 ng/mL Amphetamines + metabolites, urine  Cutoff 1000 ng/mL MDMA (Ecstasy), urine              Cutoff 500 ng/mL Cocaine Metabolite, urine          Cutoff 300 ng/mL Opiate + metabolites, urine        Cutoff 300 ng/mL Phencyclidine (PCP), urine         Cutoff 25 ng/mL Cannabinoid, urine                 Cutoff 50 ng/mL Barbiturates + metabolites, urine  Cutoff 200 ng/mL Benzodiazepine, urine              Cutoff 200 ng/mL Methadone, urine                   Cutoff 300 ng/mL  The urine drug screen provides only a preliminary, unconfirmed analytical test result and should not be used for non-medical purposes. Clinical consideration and professional judgment should be applied to any positive drug screen result due to possible interfering substances. A more specific alternate chemical method must be used in order to obtain a confirmed analytical result. Gas chromatography / mass spectrometry (GC/MS) is the preferred confirm                          atory method. Performed at [facility], [address] [place], [place] [address]    SARS Coronavirus 2 by RT PCR 01/16/2021 NEGATIVE  NEGATIVE Final   Comment: (NOTE) SARS-CoV-2 target nucleic acids are NOT DETECTED.  The SARS-CoV-2 RNA is generally detectable in upper respiratory specimens during the acute phase of infection. The lowest concentration of SARS-CoV-2 viral copies this assay can detect is 138 copies/mL. A negative result does not preclude SARS-Cov-2 infection and should not be used as the sole basis for treatment or other patient management decisions. A negative result may occur with  improper specimen collection/handling, submission of specimen other than nasopharyngeal swab, presence of viral mutation(s) within the areas targeted by this assay, and inadequate number of viral copies(<138 copies/mL). A negative result must be combined with clinical observations, patient history, and epidemiological information.  The expected result is Negative.  Fact Sheet for Patients:  BloggerCourse.com  Fact Sheet for Healthcare Providers:  SeriousBroker.it  This test is no  t yet approved or cleared by the [place] and  has been authorized for detection and/or diagnosis of SARS-CoV-2 by FDA under an Emergency Use Authorization (EUA). This EUA will remain  in effect (meaning this test can be used) for the duration of the COVID-19 declaration under Section 564(b)(1) of the Act, 21 U.S.C.section 360bbb-3(b)(1), unless the authorization is terminated  or revoked sooner.       Influenza A by PCR 01/16/2021 NEGATIVE  NEGATIVE Final   Influenza B by PCR 01/16/2021 NEGATIVE  NEGATIVE Final   Comment: (NOTE) The Xpert Xpress SARS-CoV-2/FLU/RSV plus assay is intended as an aid in the diagnosis of influenza from Nasopharyngeal swab specimens and should not be used as a sole basis for treatment. Nasal washings and aspirates are unacceptable for Xpert Xpress SARS-CoV-2/FLU/RSV testing.  Fact Sheet for Patients: BloggerCourse.com  Fact Sheet for Healthcare Providers: SeriousBroker.it  This test is not yet approved or cleared by the [place] FDA and has been authorized for detection and/or diagnosis of SARS-CoV-2 by FDA under an Emergency Use Authorization (EUA). This EUA will remain in effect (meaning this test can be used) for the duration of the COVID-19 declaration under Section 564(b)(1) of the Act, 21 U.S.C. section 360bbb-3(b)(1), unless the authorization is terminated or revoked.  Performed at [facility], [address] [place], [place] [address]   There may be more visits with results that are not included.    Blood Alcohol level:  Lab Results  Component Value Date   ETH <10 04/14/2021   ETH <10 02/22/2021    Metabolic Disorder Labs: Lab  Results  Component Value Date   HGBA1C 6.0 (H) 01/17/2021   MPG 126 01/17/2021   MPG 126 10/08/2020   No results found for: PROLACTIN Lab Results  Component Value Date   CHOL 94 01/17/2021   TRIG 100 01/17/2021   HDL 28 (L) 01/17/2021   CHOLHDL 3.4 01/17/2021   VLDL 20 01/17/2021   LDLCALC 46 01/17/2021   LDLCALC 49 10/08/2020    Therapeutic Lab Levels: No results found for: LITHIUM Lab Results  Component Value Date   VALPROATE 62 10/16/2019   No components found for:  CBMZ  Physical Findings   AIMS    Flowsheet Row Admission (Discharged) from 01/16/2021 in [facility]) [facility]  AIMS Total Score 0 0 0      AUDIT    Flowsheet Row Admission (Discharged) from 01/16/2021 in [facility] Ad[facility]) [facility] Admission (Discharged) from 10/10/2019 in [facility] INPATIENT BEHAV[facility]AL MEDICINE Admission (Discharged) from 09/14/2019 in [facility] INPATIENT BEHAVIORAL MEDICINE  Alcohol Use Disorder Identification Test Final Score (AUDIT) 1 34 15 0 0      PHQ2-9    Flowsheet Row ED from 04/14/2021 in [place] [facility] ED from 02/05/2021 in [facility] ED from 01/14/2021 in [place] [facility] DEPARTMENT ED from 11/10/2020 in [facility] EMERGENCY DEPARTMENT ED from 10/28/2020 in [place] EMERGENCY DEPARTMENT  PHQ-2 Total Score 2 4 2 4 3   PHQ-9 Total Score 16 9 6 8 10       Flowsheet Row ED from 04/15/2021 in [facility] ED from 04/14/2021 in [facility] EMERGENCY DEPARTMENT ED from 04/05/2021 in [facility]  Emergency Dept  C-SSRS RISK CATEGORY Low Risk High  Risk No Risk        Musculoskeletal  Strength & Muscle Tone: within normal limits Gait & Station: normal Patient leans: N/A  Psychiatric Specialty Exam  Presentation  General Appearance: Appropriate for Environment; Casual  Eye Contact:Good  Speech:Clear and Coherent; Normal Rate  Speech Volume:Normal  Handedness:Right   Mood and Affect  Mood:Depressed  Affect:Congruent   Thought Process  Thought Processes:Coherent  Descriptions of Associations:Intact  Orientation:Full (Time, Place and Person)  Thought Content:Logical  Diagnosis of Schizophrenia or Schizoaffective disorder in past: No    Hallucinations:Hallucinations: None  Ideas of Reference:None  Suicidal Thoughts:Suicidal Thoughts: No SI Passive Intent and/or Plan: Without Intent; Without Plan; Without Means to Carry Out  Homicidal Thoughts:Homicidal Thoughts: No   Sensorium  Memory:Immediate Good; Recent Good; Remote Good  Judgment:Fair  Insight:Fair   Executive Functions  Concentration:Good  Attention Span:Good  Recall:Good  Fund of Knowledge:Good  Language:Good   Psychomotor Activity  Psychomotor Activity:Psychomotor Activity: Normal   Assets  Assets:Communication Skills; Desire for Improvement; Leisure Time; Physical Health; Resilience; Talents/Skills   Sleep  Sleep:Sleep: Fair   No data recorded  Physical Exam  Physical Exam Vitals and nursing note reviewed.  Constitutional:      Appearance: Normal appearance.  HENT:     Head: Normocephalic.     Right Ear: External ear normal.     Left Ear: External ear normal.  Eyes:     General:        Right eye: No discharge.        Left eye: No discharge.     Conjunctiva/sclera: Conjunctivae normal.  Cardiovascular:     Rate and Rhythm: Normal rate.  Pulmonary:     Effort: Pulmonary effort is normal. No respiratory distress.  Musculoskeletal:        General: Normal range of motion.     Cervical back: Normal range of  motion.  Skin:    Coloration: Skin is not jaundiced or pale.  Neurological:     Mental Status: He is alert and oriented to person, place, and time.   Review of Systems  Constitutional: Negative.   HENT: Negative.    Eyes: Negative.   Respiratory: Negative.    Cardiovascular: Negative.   Musculoskeletal: Negative.   Skin: Negative.   Neurological: Negative.   Psychiatric/Behavioral:  Positive for depression.   Blood pressure (!) 102/55, pulse 75, temperature 97.7 F (36.5 C), temperature source Temporal, resp. rate 18, SpO2 98 %. There is no height or weight on file to calculate BMI.  Treatment Plan Summary: Daily contact with patient to assess and evaluate symptoms and progress in treatment and Medication management.  Disposition: Ongoing patient has been accepted to [facility] 12/27 and will need 2 weeks of medications & 30 day prescriptions with one refill.  Ordered Cepacol lozenges for sore throat. Will monitor, no other medication changes at this time.   Ardis Hughs, NP 04/19/2021 8:47 AM

## 2021-04-19 NOTE — ED Notes (Signed)
Pt sleeping@this time. Breathing even and unlabored. Will continue to monitor for safety 

## 2021-04-19 NOTE — ED Notes (Signed)
Pt in room asleep.  Breathing is unlabored and even. Will continue to monitor for safety.  ?

## 2021-04-19 NOTE — Progress Notes (Signed)
Nathan Koch woke up, received breakfast and made several phone calls. He c/o a sore throat with pain, NP notified. He denied feeling suicidal, but endorsed feeling less depressed. He is looking forward to transitioning to [facility] on 12/27.

## 2021-04-19 NOTE — ED Notes (Signed)
Pt asleep in bed. Respirations even and unlabored. Will continue to monitor for safety. ?

## 2021-04-19 NOTE — ED Notes (Signed)
Pt had snack.

## 2021-04-20 DIAGNOSIS — F17210 Nicotine dependence, cigarettes, uncomplicated: Secondary | ICD-10-CM | POA: Diagnosis not present

## 2021-04-20 DIAGNOSIS — R45851 Suicidal ideations: Secondary | ICD-10-CM | POA: Diagnosis not present

## 2021-04-20 DIAGNOSIS — F1994 Other psychoactive substance use, unspecified with psychoactive substance-induced mood disorder: Secondary | ICD-10-CM | POA: Diagnosis not present

## 2021-04-20 DIAGNOSIS — F332 Major depressive disorder, recurrent severe without psychotic features: Secondary | ICD-10-CM | POA: Diagnosis not present

## 2021-04-20 MED ORDER — NICOTINE 14 MG/24HR TD PT24
14.0000 mg | MEDICATED_PATCH | Freq: Every day | TRANSDERMAL | Status: DC
  Filled 2021-04-20: qty 21
  Filled 2021-04-20: qty 1

## 2021-04-20 MED ORDER — QUETIAPINE FUMARATE 50 MG PO TABS
50.0000 mg | ORAL_TABLET | Freq: Every day | ORAL | Status: DC
  Administered 2021-04-20 – 2021-04-21 (×2): 50 mg via ORAL
  Filled 2021-04-20: qty 21
  Filled 2021-04-20 (×2): qty 1

## 2021-04-20 NOTE — ED Notes (Signed)
No pain or discomfort noted/ reported. Pt alert, oriented, and ambulatory.  Breathing is even and  unlabored.  Will continue to monitor for safety. 

## 2021-04-20 NOTE — ED Notes (Signed)
Pt eating lunch

## 2021-04-20 NOTE — ED Notes (Signed)
Patient denies SI,HI,AVH.Patient is in the dinning room watching TV.  Patient is cooperative and interacts well with staff and peers. Respirations are even and unlabored. No distress noted. Patient stated no complaints at present. will continue to monitor for safety.

## 2021-04-20 NOTE — ED Notes (Signed)
Pt eating dinner

## 2021-04-20 NOTE — ED Notes (Signed)
Pt eating breakfast 

## 2021-04-20 NOTE — ED Notes (Signed)
Pt is in the room sleeping. Respirations are even and unlabored. No acute distress noted. Will continue to monitor for safety. 

## 2021-04-20 NOTE — ED Notes (Signed)
Pt came to nurse and requested for his inhaler.

## 2021-04-20 NOTE — ED Provider Notes (Signed)
Behavioral Health Progress Note  Date and Time: 04/20/2021 11:23 AM Name: Nathan Koch MRN:  [id number]  Subjective:  Nathan Koch reported  " I am doing okay."  Evaluation: Patient observed resting in bed.  He is awake, alert and oriented x3.  Denying suicidal or homicidal ideations.  Denies auditory or visual hallucination.  Reports he has plans to follow-up with [facility] residential treatment.  He denies depression or depressive symptoms.  Patient is currently prescribed Zoloft 50 mg daily which he reports taking and tolerating well.    Nathan Koch reports concerns with possible STI infection.  States " my wife told me that she was positive for chlamydia and we had sex prior to me coming in."  Nathan Koch is denying pain with urination, burning, genital itching, discharge or odor.  orders placed for STI -testing pending results.Patient declined HIV or RPR.  Reports a good appetite.  States he is resting " okay, I was wondering if I could have an increase on the Seroquel."    Denied ruminations or nightmares.  We will titrate Seroquel 25mg  to 50 mg p.o. nightly. Patient was receptive to plan. Nathan Koch is denying cravings or withdrawal symptoms currently.Chart reviewed no charted CIWA/COWS scores noted. Support, encouragement  and reassurance was provided.  Diagnosis:  Final diagnoses:  Severe episode of recurrent major depressive disorder, without psychotic features ([name])  Methamphetamine use disorder, moderate ([name])  Amphetamine-induced mood disorder ([name])  Passive suicidal ideations  Homelessness    Total Time spent with patient: 15 minutes  Past Psychiatric History:  Past Medical History:  Past Medical History:  Diagnosis Date   Anxiety    Anxiety disorder    Asthma    Bipolar 1 disorder ([name])    Depression    Methamphetamine abuse ([name])    PTSD (post-traumatic stress disorder)     Past Surgical History:  Procedure Laterality Date   CLOSED REDUCTION MANDIBLE N/A 07/02/2018   Procedure: CLOSED  REDUCTION MANDIBULAR;  Surgeon: Vernie Murders, MD;  Location: [facility] ORS;  Service: ENT;  Laterality: N/A;   Family History:  Family History  Problem Relation Age of Onset   Asthma Mother    Cancer Mother        breast cancer   Ulcers Mother    Cancer Father        esophogeal cancer   Hypertension Father    Asthma Brother    Family Psychiatric  History:  Social History:  Social History   Substance and Sexual Activity  Alcohol Use Not Currently   Alcohol/week: 13.0 standard drinks   Types: 6 Cans of beer, 7 Standard drinks or equivalent per week   Comment: previously heavy drinker 2016. most recent 4 beer/ day drinker and none since 01-11-2020     Social History   Substance and Sexual Activity  Drug Use Yes   Types: Methamphetamines   Comment: states he uses 1 g every other day    Social History   Socioeconomic History   Marital status: Single    Spouse name: Not on file   Number of children: 1   Years of education: 10   Highest education level: 10th grade  Occupational History   Not on file  Tobacco Use   Smoking status: Every Day    Packs/day: 0.50    Years: 8.00    Pack years: 4.00    Types: Cigarettes   Smokeless tobacco: Never  Vaping Use   Vaping Use: Every day   Substances: Nicotine, Flavoring  Substance  and Sexual Activity   Alcohol use: Not Currently    Alcohol/week: 13.0 standard drinks    Types: 6 Cans of beer, 7 Standard drinks or equivalent per week    Comment: previously heavy drinker 2016. most recent 4 beer/ day drinker and none since 01-11-2020   Drug use: Yes    Types: Methamphetamines    Comment: states he uses 1 g every other day   Sexual activity: Not on file  Other Topics Concern   Not on file  Social History Narrative   Not on file   Social Determinants of Health   Financial Resource Strain: Not on file  Food Insecurity: Not on file  Transportation Needs: Not on file  Physical Activity: Not on file  Stress: Not on file   Social Connections: Not on file   SDOH:  SDOH Screenings   Alcohol Screen: Low Risk    Last Alcohol Screening Score (AUDIT): 1  Depression (PHQ2-9): Medium Risk   PHQ-2 Score: 16  Financial Resource Strain: Not on file  Food Insecurity: Not on file  Housing: Not on file  Physical Activity: Not on file  Social Connections: Not on file  Stress: Not on file  Tobacco Use: High Risk   Smoking Tobacco Use: Every Day   Smokeless Tobacco Use: Never   Passive Exposure: Not on file  Transportation Needs: Not on file   Additional Social History:                         Sleep: Negative  Appetite:  Fair  Current Medications:  Current Facility-Administered Medications  Medication Dose Route Frequency Provider Last Rate Last Admin   acetaminophen (TYLENOL) tablet 650 mg  650 mg Oral Q6H PRN Rankin, Shuvon B, NP   650 mg at 04/18/21 2109   albuterol (VENTOLIN HFA) 108 (90 Base) MCG/ACT inhaler 2 puff  2 puff Inhalation Q4H PRN Jaclyn Shaggy, PA-C   2 puff at 04/19/21 0449   alum & mag hydroxide-simeth (MAALOX/MYLANTA) 200-200-20 MG/5ML suspension 30 mL  30 mL Oral Q4H PRN Rankin, Shuvon B, NP       hydrOXYzine (ATARAX) tablet 25 mg  25 mg Oral TID PRN Rankin, Shuvon B, NP       magnesium hydroxide (MILK OF MAGNESIA) suspension 30 mL  30 mL Oral Daily PRN Rankin, Shuvon B, NP       menthol-cetylpyridinium (CEPACOL) lozenge 3 mg  1 lozenge Oral Q2H PRN Ardis Hughs, NP   3 mg at 04/19/21 1143   nicotine (NICODERM CQ - dosed in mg/24 hours) patch 14 mg  14 mg Transdermal Daily Oneta Rack, NP       QUEtiapine (SEROQUEL) tablet 50 mg  50 mg Oral QHS Oneta Rack, NP       sertraline (ZOLOFT) tablet 50 mg  50 mg Oral QHS Ladona Ridgel, Cody W, PA-C   50 mg at 04/19/21 2116   sodium chloride (OCEAN) 0.65 % nasal spray 1 spray  1 spray Each Nare PRN Jaclyn Shaggy, PA-C       traZODone (DESYREL) tablet 50 mg  50 mg Oral QHS PRN Melbourne Abts W, PA-C   50 mg at 04/17/21 2107    Current Outpatient Medications  Medication Sig Dispense Refill   albuterol (VENTOLIN HFA) 108 (90 Base) MCG/ACT inhaler Inhale 2 puffs into the lungs every 4 (four) hours as needed for wheezing or shortness of breath. (Patient not taking: Reported on 02/12/2021)  6.7 g 1   sertraline (ZOLOFT) 50 MG tablet Take 1 tablet (50 mg total) by mouth at bedtime. (Patient not taking: Reported on 04/15/2021) 7 tablet 0    Labs  Lab Results:  Admission on 04/14/2021, Discharged on 04/15/2021  Component Date Value Ref Range Status   SARS Coronavirus 2 by RT PCR 04/14/2021 NEGATIVE  NEGATIVE Final   Comment: (NOTE) SARS-CoV-2 target nucleic acids are NOT DETECTED.  The SARS-CoV-2 RNA is generally detectable in upper respiratory specimens during the acute phase of infection. The lowest concentration of SARS-CoV-2 viral copies this assay can detect is 138 copies/mL. A negative result does not preclude SARS-Cov-2 infection and should not be used as the sole basis for treatment or other patient management decisions. A negative result may occur with  improper specimen collection/handling, submission of specimen other than nasopharyngeal swab, presence of viral mutation(s) within the areas targeted by this assay, and inadequate number of viral copies(<138 copies/mL). A negative result must be combined with clinical observations, patient history, and epidemiological information. The expected result is Negative.  Fact Sheet for Patients:  BloggerCourse.com  Fact Sheet for Healthcare Providers:  SeriousBroker.it  This test is no                          t yet approved or cleared by the [place] FDA and  has been authorized for detection and/or diagnosis of SARS-CoV-2 by FDA under an Emergency Use Authorization (EUA). This EUA will remain  in effect (meaning this test can be used) for the duration of the COVID-19 declaration under Section  564(b)(1) of the Act, 21 U.S.C.section 360bbb-3(b)(1), unless the authorization is terminated  or revoked sooner.       Influenza A by PCR 04/14/2021 NEGATIVE  NEGATIVE Final   Influenza B by PCR 04/14/2021 NEGATIVE  NEGATIVE Final   Comment: (NOTE) The Xpert Xpress SARS-CoV-2/FLU/RSV plus assay is intended as an aid in the diagnosis of influenza from Nasopharyngeal swab specimens and should not be used as a sole basis for treatment. Nasal washings and aspirates are unacceptable for Xpert Xpress SARS-CoV-2/FLU/RSV testing.  Fact Sheet for Patients: BloggerCourse.com  Fact Sheet for Healthcare Providers: SeriousBroker.it  This test is not yet approved or cleared by the [place] FDA and has been authorized for detection and/or diagnosis of SARS-CoV-2 by FDA under an Emergency Use Authorization (EUA). This EUA will remain in effect (meaning this test can be used) for the duration of the COVID-19 declaration under Section 564(b)(1) of the Act, 21 U.S.C. section 360bbb-3(b)(1), unless the authorization is terminated or revoked.  Performed at [facility], [address] [place], [place] [address]    Sodium 04/14/2021 134 (L)  135 - 145 mmol/L Final   Potassium 04/14/2021 3.6  3.5 - 5.1 mmol/L Final   Chloride 04/14/2021 94 (L)  98 - 111 mmol/L Final   CO2 04/14/2021 28  22 - 32 mmol/L Final   Glucose, Bld 04/14/2021 119 (H)  70 - 99 mg/dL Final   Glucose reference range applies only to samples taken after fasting for at least 8 hours.   BUN 04/14/2021 14  6 - 20 mg/dL Final   Creatinine, Ser 04/14/2021 0.77  0.61 - 1.24 mg/dL Final   Calcium 91/47/8295 9.7  8.9 - 10.3 mg/dL Final   Total Protein 62/13/0865 8.8 (H)  6.5 - 8.1 g/dL Final   Albumin 78/46/9629 4.4  3.5 - 5.0 g/dL Final   AST 52/84/1324  47 (H)  15 - 41 U/L Final   ALT 04/14/2021 46 (H)  0 - 44 U/L Final   Alkaline Phosphatase 04/14/2021 71  38 - 126 U/L Final    Total Bilirubin 04/14/2021 1.0  0.3 - 1.2 mg/dL Final   GFR, Estimated 04/14/2021 >60  >60 mL/min Final   Comment: (NOTE) Calculated using the CKD-EPI Creatinine Equation (2021)    Anion gap 04/14/2021 12  5 - 15 Final   Performed at [facility], [address], [place], [place] [address]   Alcohol, Ethyl (B) 04/14/2021 <10  <10 mg/dL Final   Comment: (NOTE) Lowest detectable limit for serum alcohol is 10 mg/dL.  For medical purposes only. Performed at [facility], [address] [place], [place] [address]    Salicylate Lvl 04/14/2021 <7.0 (L)  7.0 - 30.0 mg/dL Final   Performed at [facility], [address] [place], [place] [address]   Acetaminophen (Tylenol), Serum 04/14/2021 <10 (L)  10 - 30 ug/mL Final   Comment: (NOTE) Therapeutic concentrations vary significantly. A range of 10-30 ug/mL  may be an effective concentration for many patients. However, some  are best treated at concentrations outside of this range. Acetaminophen concentrations >150 ug/mL at 4 hours after ingestion  and >50 ug/mL at 12 hours after ingestion are often associated with  toxic reactions.  Performed at [facility], [address] [place], [place] [address]    WBC 04/14/2021 12.6 (H)  4.0 - 10.5 K/uL Final   RBC 04/14/2021 5.35  4.22 - 5.81 MIL/uL Final   Hemoglobin 04/14/2021 15.7  13.0 - 17.0 g/dL Final   HCT 65/78/4696 46.9  39.0 - 52.0 % Final   MCV 04/14/2021 87.7  80.0 - 100.0 fL Final   MCH 04/14/2021 29.3  26.0 - 34.0 pg Final   MCHC 04/14/2021 33.5  30.0 - 36.0 g/dL Final   RDW 29/52/8413 13.5  11.5 - 15.5 % Final   Platelets 04/14/2021 481 (H)  150 - 400 K/uL Final   nRBC 04/14/2021 0.0  0.0 - 0.2 % Final   Performed at [facility], [address], [place], [place] [address]   Opiates 04/14/2021 NONE DETECTED  NONE DETECTED Final   Cocaine 04/14/2021 NONE DETECTED  NONE DETECTED Final   Benzodiazepines 04/14/2021 NONE DETECTED  NONE DETECTED Final    Amphetamines 04/14/2021 POSITIVE (A)  NONE DETECTED Final   Tetrahydrocannabinol 04/14/2021 NONE DETECTED  NONE DETECTED Final   Barbiturates 04/14/2021 NONE DETECTED  NONE DETECTED Final   Comment: (NOTE) DRUG SCREEN FOR MEDICAL PURPOSES ONLY.  IF CONFIRMATION IS NEEDED FOR ANY PURPOSE, NOTIFY LAB WITHIN 5 DAYS.  LOWEST DETECTABLE LIMITS FOR URINE DRUG SCREEN Drug Class                     Cutoff (ng/mL) Amphetamine and metabolites    1000 Barbiturate and metabolites    200 Benzodiazepine                 200 Tricyclics and metabolites     300 Opiates and metabolites        300 Cocaine and metabolites        300 THC                            50 Performed at [facility], [address], [place], [place] [address]   Admission on 04/05/2021, Discharged on 04/05/2021  Component  Date Value Ref Range Status   Lipase 04/05/2021 24  11 - 51 U/L Final   Performed at [facility], [address], [place], [place] [address]   Sodium 04/05/2021 140  135 - 145 mmol/L Final   Potassium 04/05/2021 3.8  3.5 - 5.1 mmol/L Final   Chloride 04/05/2021 105  98 - 111 mmol/L Final   CO2 04/05/2021 28  22 - 32 mmol/L Final   Glucose, Bld 04/05/2021 88  70 - 99 mg/dL Final   Glucose reference range applies only to samples taken after fasting for at least 8 hours.   BUN 04/05/2021 10  6 - 20 mg/dL Final   Creatinine, Ser 04/05/2021 0.85  0.61 - 1.24 mg/dL Final   Calcium 18/84/1660 9.9  8.9 - 10.3 mg/dL Final   Total Protein 63/04/6008 7.9  6.5 - 8.1 g/dL Final   Albumin 93/23/5573 4.6  3.5 - 5.0 g/dL Final   AST 22/05/5425 13 (L)  15 - 41 U/L Final   ALT 04/05/2021 11  0 - 44 U/L Final   Alkaline Phosphatase 04/05/2021 65  38 - 126 U/L Final   Total Bilirubin 04/05/2021 0.8  0.3 - 1.2 mg/dL Final   GFR, Estimated 04/05/2021 >60  >60 mL/min Final   Comment: (NOTE) Calculated using the CKD-EPI Creatinine Equation (2021)    Anion gap 04/05/2021 7  5 - 15 Final   Performed at [facility], [address], [place], [place] [address]   WBC 04/05/2021 6.5  4.0 - 10.5 K/uL Final   RBC 04/05/2021 5.25  4.22 - 5.81 MIL/uL Final   Hemoglobin 04/05/2021 14.6  13.0 - 17.0 g/dL Final   HCT 62/83/1517 45.2  39.0 - 52.0 % Final   MCV 04/05/2021 86.1  80.0 - 100.0 fL Final   MCH 04/05/2021 27.8  26.0 - 34.0 pg Final   MCHC 04/05/2021 32.3  30.0 - 36.0 g/dL Final   RDW 61/60/7371 13.9  11.5 - 15.5 % Final   Platelets 04/05/2021 324  150 - 400 K/uL Final   nRBC 04/05/2021 0.0  0.0 - 0.2 % Final   Performed at [facility], [address], [place], [place] [address]   SARS Coronavirus 2 by RT PCR 04/05/2021 NEGATIVE  NEGATIVE Final   Comment: (NOTE) SARS-CoV-2 target nucleic acids are NOT DETECTED.  The SARS-CoV-2 RNA is generally detectable in upper respiratory specimens during the acute phase of infection. The lowest concentration of SARS-CoV-2 viral copies this assay can detect is 138 copies/mL. A negative result does not preclude SARS-Cov-2 infection and should not be used as the sole basis for treatment or other patient management decisions. A negative result may occur with  improper specimen collection/handling, submission of specimen other than nasopharyngeal swab, presence of viral mutation(s) within the areas targeted by this assay, and inadequate number of viral copies(<138 copies/mL). A negative result must be combined with clinical observations, patient history, and epidemiological information. The expected result is Negative.  Fact Sheet for Patients:  BloggerCourse.com  Fact Sheet for Healthcare Providers:  SeriousBroker.it  This test is no                          t yet approved or cleared by the [place] FDA and  has been authorized for detection and/or diagnosis of SARS-CoV-2 by FDA under an Emergency Use Authorization (EUA). This EUA will remain  in effect  (meaning this test can be used)  for the duration of the COVID-19 declaration under Section 564(b)(1) of the Act, 21 U.S.C.section 360bbb-3(b)(1), unless the authorization is terminated  or revoked sooner.       Influenza A by PCR 04/05/2021 NEGATIVE  NEGATIVE Final   Influenza B by PCR 04/05/2021 NEGATIVE  NEGATIVE Final   Comment: (NOTE) The Xpert Xpress SARS-CoV-2/FLU/RSV plus assay is intended as an aid in the diagnosis of influenza from Nasopharyngeal swab specimens and should not be used as a sole basis for treatment. Nasal washings and aspirates are unacceptable for Xpert Xpress SARS-CoV-2/FLU/RSV testing.  Fact Sheet for Patients: BloggerCourse.com  Fact Sheet for Healthcare Providers: SeriousBroker.it  This test is not yet approved or cleared by the [place] FDA and has been authorized for detection and/or diagnosis of SARS-CoV-2 by FDA under an Emergency Use Authorization (EUA). This EUA will remain in effect (meaning this test can be used) for the duration of the COVID-19 declaration under Section 564(b)(1) of the Act, 21 U.S.C. section 360bbb-3(b)(1), unless the authorization is terminated or revoked.  Performed at [facility], [address] [place], [place] [address]   Admission on 02/22/2021, Discharged on 02/22/2021  Component Date Value Ref Range Status   Sodium 02/22/2021 133 (L)  135 - 145 mmol/L Final   Potassium 02/22/2021 3.8  3.5 - 5.1 mmol/L Final   Chloride 02/22/2021 93 (L)  98 - 111 mmol/L Final   CO2 02/22/2021 30  22 - 32 mmol/L Final   Glucose, Bld 02/22/2021 116 (H)  70 - 99 mg/dL Final   Glucose reference range applies only to samples taken after fasting for at least 8 hours.   BUN 02/22/2021 26 (H)  6 - 20 mg/dL Final   Creatinine, Ser 02/22/2021 0.95  0.61 - 1.24 mg/dL Final   Calcium 60/45/4098 9.7  8.9 - 10.3 mg/dL Final   Total Protein 11/91/4782 8.6 (H)  6.5 -  8.1 g/dL Final   Albumin 95/62/1308 4.7  3.5 - 5.0 g/dL Final   AST 65/78/4696 51 (H)  15 - 41 U/L Final   ALT 02/22/2021 34  0 - 44 U/L Final   Alkaline Phosphatase 02/22/2021 70  38 - 126 U/L Final   Total Bilirubin 02/22/2021 2.6 (H)  0.3 - 1.2 mg/dL Final   GFR, Estimated 02/22/2021 >60  >60 mL/min Final   Comment: (NOTE) Calculated using the CKD-EPI Creatinine Equation (2021)    Anion gap 02/22/2021 10  5 - 15 Final   Performed at [facility], [address] [place], [place] [address]   Alcohol, Ethyl (B) 02/22/2021 <10  <10 mg/dL Final   Comment: (NOTE) Lowest detectable limit for serum alcohol is 10 mg/dL.  For medical purposes only. Performed at [facility], [address] [place], [place] [address]    WBC 02/22/2021 11.4 (H)  4.0 - 10.5 K/uL Final   RBC 02/22/2021 5.00  4.22 - 5.81 MIL/uL Final   Hemoglobin 02/22/2021 14.6  13.0 - 17.0 g/dL Final   HCT 40/01/2724 41.4  39.0 - 52.0 % Final   MCV 02/22/2021 82.8  80.0 - 100.0 fL Final   MCH 02/22/2021 29.2  26.0 - 34.0 pg Final   MCHC 02/22/2021 35.3  30.0 - 36.0 g/dL Final   RDW 36/64/4034 13.1  11.5 - 15.5 % Final   Platelets 02/22/2021 399  150 - 400 K/uL Final   nRBC 02/22/2021 0.0  0.0 - 0.2 % Final   Performed at [facility], [address] [place], [place] [address]   Tricyclic, Ur Screen 02/22/2021 NONE DETECTED  NONE DETECTED Final   Amphetamines, Ur Screen 02/22/2021 POSITIVE (A)  NONE DETECTED Final   MDMA (Ecstasy)Ur Screen 02/22/2021 NONE DETECTED  NONE DETECTED Final   Cocaine Metabolite,Ur [place] 02/22/2021 NONE DETECTED  NONE DETECTED Final   Opiate, Ur Screen 02/22/2021 NONE DETECTED  NONE DETECTED Final   Phencyclidine (PCP) Ur S 02/22/2021 NONE DETECTED  NONE DETECTED Final   Cannabinoid 50 Ng, Ur [place] 02/22/2021 POSITIVE (A)  NONE DETECTED Final   Barbiturates, Ur Screen 02/22/2021 NONE DETECTED  NONE DETECTED Final   Benzodiazepine, Ur Scrn 02/22/2021 NONE DETECTED  NONE  DETECTED Final   Methadone Scn, Ur 02/22/2021 NONE DETECTED  NONE DETECTED Final   Comment: (NOTE) Tricyclics + metabolites, urine    Cutoff 1000 ng/mL Amphetamines + metabolites, urine  Cutoff 1000 ng/mL MDMA (Ecstasy), urine              Cutoff 500 ng/mL Cocaine Metabolite, urine          Cutoff 300 ng/mL Opiate + metabolites, urine        Cutoff 300 ng/mL Phencyclidine (PCP), urine         Cutoff 25 ng/mL Cannabinoid, urine                 Cutoff 50 ng/mL Barbiturates + metabolites, urine  Cutoff 200 ng/mL Benzodiazepine, urine              Cutoff 200 ng/mL Methadone, urine                   Cutoff 300 ng/mL  The urine drug screen provides only a preliminary, unconfirmed analytical test result and should not be used for non-medical purposes. Clinical consideration and professional judgment should be applied to any positive drug screen result due to possible interfering substances. A more specific alternate chemical method must be used in order to obtain a confirmed analytical result. Gas chromatography / mass spectrometry (GC/MS) is the preferred confirm                          atory method. Performed at [facility], [address] [place], [place] [address]   Admission on 02/18/2021, Discharged on 02/18/2021  Component Date Value Ref Range Status   WBC 02/18/2021 15.0 (H)  4.0 - 10.5 K/uL Final   RBC 02/18/2021 5.61  4.22 - 5.81 MIL/uL Final   Hemoglobin 02/18/2021 15.9  13.0 - 17.0 g/dL Final   HCT 17/61/6073 46.8  39.0 - 52.0 % Final   MCV 02/18/2021 83.4  80.0 - 100.0 fL Final   MCH 02/18/2021 28.3  26.0 - 34.0 pg Final   MCHC 02/18/2021 34.0  30.0 - 36.0 g/dL Final   RDW 71/09/2692 13.3  11.5 - 15.5 % Final   Platelets 02/18/2021 430 (H)  150 - 400 K/uL Final   nRBC 02/18/2021 0.0  0.0 - 0.2 % Final   Neutrophils Relative % 02/18/2021 74  % Final   Neutro Abs 02/18/2021 11.1 (H)  1.7 - 7.7 K/uL Final   Lymphocytes Relative 02/18/2021 17  % Final   Lymphs  Abs 02/18/2021 2.5  0.7 - 4.0 K/uL Final   Monocytes Relative 02/18/2021 9  % Final   Monocytes Absolute 02/18/2021 1.3 (H)  0.1 - 1.0 K/uL Final   Eosinophils Relative 02/18/2021 0  % Final   Eosinophils Absolute 02/18/2021 0.0  0.0 -  0.5 K/uL Final   Basophils Relative 02/18/2021 0  % Final   Basophils Absolute 02/18/2021 0.1  0.0 - 0.1 K/uL Final   Immature Granulocytes 02/18/2021 0  % Final   Abs Immature Granulocytes 02/18/2021 0.04  0.00 - 0.07 K/uL Final   Performed at [facility], [address] [place], [place] [address]   Sodium 02/18/2021 136  135 - 145 mmol/L Final   Potassium 02/18/2021 3.8  3.5 - 5.1 mmol/L Final   Chloride 02/18/2021 99  98 - 111 mmol/L Final   CO2 02/18/2021 24  22 - 32 mmol/L Final   Glucose, Bld 02/18/2021 139 (H)  70 - 99 mg/dL Final   Glucose reference range applies only to samples taken after fasting for at least 8 hours.   BUN 02/18/2021 18  6 - 20 mg/dL Final   Creatinine, Ser 02/18/2021 1.26 (H)  0.61 - 1.24 mg/dL Final   Calcium 13/11/6576 10.3  8.9 - 10.3 mg/dL Final   Total Protein 46/96/2952 9.7 (H)  6.5 - 8.1 g/dL Final   Albumin 84/13/2440 5.2 (H)  3.5 - 5.0 g/dL Final   AST 02/21/2535 52 (H)  15 - 41 U/L Final   ALT 02/18/2021 24  0 - 44 U/L Final   Alkaline Phosphatase 02/18/2021 83  38 - 126 U/L Final   Total Bilirubin 02/18/2021 1.9 (H)  0.3 - 1.2 mg/dL Final   GFR, Estimated 02/18/2021 >60  >60 mL/min Final   Comment: (NOTE) Calculated using the CKD-EPI Creatinine Equation (2021)    Anion gap 02/18/2021 13  5 - 15 Final   Performed at [facility], [address] [place], [place] [address]   Lipase 02/18/2021 24  11 - 51 U/L Final   Performed at [facility], [address] [place], [place] [address]   SARS Coronavirus 2 by RT PCR 02/18/2021 NEGATIVE  NEGATIVE Final   Comment: (NOTE) SARS-CoV-2 target nucleic acids are NOT DETECTED.  The SARS-CoV-2 RNA is generally detectable in upper  respiratory specimens during the acute phase of infection. The lowest concentration of SARS-CoV-2 viral copies this assay can detect is 138 copies/mL. A negative result does not preclude SARS-Cov-2 infection and should not be used as the sole basis for treatment or other patient management decisions. A negative result may occur with  improper specimen collection/handling, submission of specimen other than nasopharyngeal swab, presence of viral mutation(s) within the areas targeted by this assay, and inadequate number of viral copies(<138 copies/mL). A negative result must be combined with clinical observations, patient history, and epidemiological information. The expected result is Negative.  Fact Sheet for Patients:  BloggerCourse.com  Fact Sheet for Healthcare Providers:  SeriousBroker.it  This test is no                          t yet approved or cleared by the [place] FDA and  has been authorized for detection and/or diagnosis of SARS-CoV-2 by FDA under an Emergency Use Authorization (EUA). This EUA will remain  in effect (meaning this test can be used) for the duration of the COVID-19 declaration under Section 564(b)(1) of the Act, 21 U.S.C.section 360bbb-3(b)(1), unless the authorization is terminated  or revoked sooner.       Influenza A by PCR 02/18/2021 NEGATIVE  NEGATIVE Final   Influenza B by PCR 02/18/2021 NEGATIVE  NEGATIVE Final   Comment: (NOTE) The Xpert Xpress SARS-CoV-2/FLU/RSV plus assay is intended as an  aid in the diagnosis of influenza from Nasopharyngeal swab specimens and should not be used as a sole basis for treatment. Nasal washings and aspirates are unacceptable for Xpert Xpress SARS-CoV-2/FLU/RSV testing.  Fact Sheet for Patients: BloggerCourse.com  Fact Sheet for Healthcare Providers: SeriousBroker.it  This test is not yet approved or  cleared by the [place] FDA and has been authorized for detection and/or diagnosis of SARS-CoV-2 by FDA under an Emergency Use Authorization (EUA). This EUA will remain in effect (meaning this test can be used) for the duration of the COVID-19 declaration under Section 564(b)(1) of the Act, 21 U.S.C. section 360bbb-3(b)(1), unless the authorization is terminated or revoked.  Performed at [facility], [address] [place], [place] [address]   Admission on 02/13/2021, Discharged on 02/14/2021  Component Date Value Ref Range Status   SARS Coronavirus 2 by RT PCR 02/13/2021 NEGATIVE  NEGATIVE Final   Comment: (NOTE) SARS-CoV-2 target nucleic acids are NOT DETECTED.  The SARS-CoV-2 RNA is generally detectable in upper respiratory specimens during the acute phase of infection. The lowest concentration of SARS-CoV-2 viral copies this assay can detect is 138 copies/mL. A negative result does not preclude SARS-Cov-2 infection and should not be used as the sole basis for treatment or other patient management decisions. A negative result may occur with  improper specimen collection/handling, submission of specimen other than nasopharyngeal swab, presence of viral mutation(s) within the areas targeted by this assay, and inadequate number of viral copies(<138 copies/mL). A negative result must be combined with clinical observations, patient history, and epidemiological information. The expected result is Negative.  Fact Sheet for Patients:  BloggerCourse.com  Fact Sheet for Healthcare Providers:  SeriousBroker.it  This test is no                          t yet approved or cleared by the [place] FDA and  has been authorized for detection and/or diagnosis of SARS-CoV-2 by FDA under an Emergency Use Authorization (EUA). This EUA will remain  in effect (meaning this test can be used) for the duration of the COVID-19  declaration under Section 564(b)(1) of the Act, 21 U.S.C.section 360bbb-3(b)(1), unless the authorization is terminated  or revoked sooner.       Influenza A by PCR 02/13/2021 NEGATIVE  NEGATIVE Final   Influenza B by PCR 02/13/2021 NEGATIVE  NEGATIVE Final   Comment: (NOTE) The Xpert Xpress SARS-CoV-2/FLU/RSV plus assay is intended as an aid in the diagnosis of influenza from Nasopharyngeal swab specimens and should not be used as a sole basis for treatment. Nasal washings and aspirates are unacceptable for Xpert Xpress SARS-CoV-2/FLU/RSV testing.  Fact Sheet for Patients: BloggerCourse.com  Fact Sheet for Healthcare Providers: SeriousBroker.it  This test is not yet approved or cleared by the [place] FDA and has been authorized for detection and/or diagnosis of SARS-CoV-2 by FDA under an Emergency Use Authorization (EUA). This EUA will remain in effect (meaning this test can be used) for the duration of the COVID-19 declaration under Section 564(b)(1) of the Act, 21 U.S.C. section 360bbb-3(b)(1), unless the authorization is terminated or revoked.  Performed at [facility], [address], [place], [place] [address]    Alcohol, Ethyl (B) 02/13/2021 <10  <10 mg/dL Final   Comment: (NOTE) Lowest detectable limit for serum alcohol is 10 mg/dL.  For medical purposes only. Performed at [facility], [address] [place], [place] [address]  POC Amphetamine UR 02/13/2021 Positive (A)  NONE DETECTED (Cut Off Level 1000 ng/mL) Final   POC Secobarbital (BAR) 02/13/2021 None Detected  NONE DETECTED (Cut Off Level 300 ng/mL) Final   POC Buprenorphine (BUP) 02/13/2021 None Detected  NONE DETECTED (Cut Off Level 10 ng/mL) Final   POC Oxazepam (BZO) 02/13/2021 None Detected  NONE DETECTED (Cut Off Level 300 ng/mL) Final   POC Cocaine UR 02/13/2021 None Detected  NONE DETECTED (Cut Off Level 300 ng/mL) Final   POC  Methamphetamine UR 02/13/2021 Positive (A)  NONE DETECTED (Cut Off Level 1000 ng/mL) Final   POC Morphine 02/13/2021 None Detected  NONE DETECTED (Cut Off Level 300 ng/mL) Final   POC Oxycodone UR 02/13/2021 None Detected  NONE DETECTED (Cut Off Level 100 ng/mL) Final   POC Methadone UR 02/13/2021 None Detected  NONE DETECTED (Cut Off Level 300 ng/mL) Final   POC Marijuana UR 02/13/2021 None Detected  NONE DETECTED (Cut Off Level 50 ng/mL) Final   SARSCOV2ONAVIRUS 2 AG 02/13/2021 NEGATIVE  NEGATIVE Final   Comment: (NOTE) SARS-CoV-2 antigen NOT DETECTED.   Negative results are presumptive.  Negative results do not preclude SARS-CoV-2 infection and should not be used as the sole basis for treatment or other patient management decisions, including infection  control decisions, particularly in the presence of clinical signs and  symptoms consistent with COVID-19, or in those who have been in contact with the virus.  Negative results must be combined with clinical observations, patient history, and epidemiological information. The expected result is Negative.  Fact Sheet for Patients: https://www.jennings-kim.com/  Fact Sheet for Healthcare Providers: https://alexander-rogers.biz/  This test is not yet approved or cleared by the [place] FDA and  has been authorized for detection and/or diagnosis of SARS-CoV-2 by FDA under an Emergency Use Authorization (EUA).  This EUA will remain in effect (meaning this test can be used) for the duration of  the COV                          ID-19 declaration under Section 564(b)(1) of the Act, 21 U.S.C. section 360bbb-3(b)(1), unless the authorization is terminated or revoked sooner.    Admission on 02/11/2021, Discharged on 02/12/2021  Component Date Value Ref Range Status   Sodium 02/11/2021 140  135 - 145 mmol/L Final   Potassium 02/11/2021 4.1  3.5 - 5.1 mmol/L Final   Chloride 02/11/2021 103  98 - 111 mmol/L Final    CO2 02/11/2021 24  22 - 32 mmol/L Final   Glucose, Bld 02/11/2021 89  70 - 99 mg/dL Final   Glucose reference range applies only to samples taken after fasting for at least 8 hours.   BUN 02/11/2021 12  6 - 20 mg/dL Final   Creatinine, Ser 02/11/2021 0.89  0.61 - 1.24 mg/dL Final   Calcium 30/86/5784 10.2  8.9 - 10.3 mg/dL Final   Total Protein 69/62/9528 8.4 (H)  6.5 - 8.1 g/dL Final   Albumin 41/32/4401 4.6  3.5 - 5.0 g/dL Final   AST 02/72/5366 40  15 - 41 U/L Final   ALT 02/11/2021 19  0 - 44 U/L Final   Alkaline Phosphatase 02/11/2021 83  38 - 126 U/L Final   Total Bilirubin 02/11/2021 1.7 (H)  0.3 - 1.2 mg/dL Final   GFR, Estimated 02/11/2021 >60  >60 mL/min Final   Comment: (NOTE) Calculated using the CKD-EPI Creatinine Equation (2021)    Anion gap 02/11/2021 13  5 -  15 Final   Performed at [facility], [address] [place], [place] [address]   Alcohol, Ethyl (B) 02/11/2021 <10  <10 mg/dL Final   Comment: (NOTE) Lowest detectable limit for serum alcohol is 10 mg/dL.  For medical purposes only. Performed at [facility], [address], [place], [place] [address]    Salicylate Lvl 02/11/2021 <7.0 (L)  7.0 - 30.0 mg/dL Final   Performed at [facility], [address], [place], [place] [address]   Acetaminophen (Tylenol), Serum 02/11/2021 <10 (L)  10 - 30 ug/mL Final   Comment: (NOTE) Therapeutic concentrations vary significantly. A range of 10-30 ug/mL  may be an effective concentration for many patients. However, some  are best treated at concentrations outside of this range. Acetaminophen concentrations >150 ug/mL at 4 hours after ingestion  and >50 ug/mL at 12 hours after ingestion are often associated with  toxic reactions.  Performed at [facility], [address] [place], [place] [address]    WBC 02/11/2021 11.6 (H)  4.0 - 10.5 K/uL Final   RBC 02/11/2021 5.47  4.22 - 5.81 MIL/uL Final   Hemoglobin 02/11/2021 15.5  13.0 - 17.0  g/dL Final   HCT 30/86/5784 46.5  39.0 - 52.0 % Final   MCV 02/11/2021 85.0  80.0 - 100.0 fL Final   MCH 02/11/2021 28.3  26.0 - 34.0 pg Final   MCHC 02/11/2021 33.3  30.0 - 36.0 g/dL Final   RDW 69/62/9528 13.8  11.5 - 15.5 % Final   Platelets 02/11/2021 337  150 - 400 K/uL Final   nRBC 02/11/2021 0.0  0.0 - 0.2 % Final   Performed at [facility], [address] [place], [place] [address]   Opiates 02/11/2021 NONE DETECTED  NONE DETECTED Final   Cocaine 02/11/2021 NONE DETECTED  NONE DETECTED Final   Benzodiazepines 02/11/2021 NONE DETECTED  NONE DETECTED Final   Amphetamines 02/11/2021 POSITIVE (A)  NONE DETECTED Final   Tetrahydrocannabinol 02/11/2021 NONE DETECTED  NONE DETECTED Final   Barbiturates 02/11/2021 NONE DETECTED  NONE DETECTED Final   Comment: (NOTE) DRUG SCREEN FOR MEDICAL PURPOSES ONLY.  IF CONFIRMATION IS NEEDED FOR ANY PURPOSE, NOTIFY LAB WITHIN 5 DAYS.  LOWEST DETECTABLE LIMITS FOR URINE DRUG SCREEN Drug Class                     Cutoff (ng/mL) Amphetamine and metabolites    1000 Barbiturate and metabolites    200 Benzodiazepine                 200 Tricyclics and metabolites     300 Opiates and metabolites        300 Cocaine and metabolites        300 THC                            50 Performed at [facility], [address] [place], [place] [address]    SARS Coronavirus 2 by RT PCR 02/11/2021 NEGATIVE  NEGATIVE Final   Comment: (NOTE) SARS-CoV-2 target nucleic acids are NOT DETECTED.  The SARS-CoV-2 RNA is generally detectable in upper respiratory specimens during the acute phase of infection. The lowest concentration of SARS-CoV-2 viral copies this assay can detect is 138 copies/mL. A negative result does not preclude SARS-Cov-2 infection and should not be used as the sole basis for treatment or other patient  management decisions. A negative result may occur with  improper specimen collection/handling, submission of specimen  other than nasopharyngeal swab, presence of viral mutation(s) within the areas targeted by this assay, and inadequate number of viral copies(<138 copies/mL). A negative result must be combined with clinical observations, patient history, and epidemiological information. The expected result is Negative.  Fact Sheet for Patients:  BloggerCourse.com  Fact Sheet for Healthcare Providers:  SeriousBroker.it  This test is no                          t yet approved or cleared by the [place] FDA and  has been authorized for detection and/or diagnosis of SARS-CoV-2 by FDA under an Emergency Use Authorization (EUA). This EUA will remain  in effect (meaning this test can be used) for the duration of the COVID-19 declaration under Section 564(b)(1) of the Act, 21 U.S.C.section 360bbb-3(b)(1), unless the authorization is terminated  or revoked sooner.       Influenza A by PCR 02/11/2021 NEGATIVE  NEGATIVE Final   Influenza B by PCR 02/11/2021 NEGATIVE  NEGATIVE Final   Comment: (NOTE) The Xpert Xpress SARS-CoV-2/FLU/RSV plus assay is intended as an aid in the diagnosis of influenza from Nasopharyngeal swab specimens and should not be used as a sole basis for treatment. Nasal washings and aspirates are unacceptable for Xpert Xpress SARS-CoV-2/FLU/RSV testing.  Fact Sheet for Patients: BloggerCourse.com  Fact Sheet for Healthcare Providers: SeriousBroker.it  This test is not yet approved or cleared by the [place] FDA and has been authorized for detection and/or diagnosis of SARS-CoV-2 by FDA under an Emergency Use Authorization (EUA). This EUA will remain in effect (meaning this test can be used) for the duration of the COVID-19 declaration under Section 564(b)(1) of the Act, 21 U.S.C. section 360bbb-3(b)(1), unless the authorization is terminated or revoked.  Performed at  [facility], [address] [place], [place] [address]   Admission on 02/05/2021, Discharged on 02/05/2021  Component Date Value Ref Range Status   SARS Coronavirus 2 by RT PCR 02/05/2021 NEGATIVE  NEGATIVE Final   Comment: (NOTE) SARS-CoV-2 target nucleic acids are NOT DETECTED.  The SARS-CoV-2 RNA is generally detectable in upper respiratory specimens during the acute phase of infection. The lowest concentration of SARS-CoV-2 viral copies this assay can detect is 138 copies/mL. A negative result does not preclude SARS-Cov-2 infection and should not be used as the sole basis for treatment or other patient management decisions. A negative result may occur with  improper specimen collection/handling, submission of specimen other than nasopharyngeal swab, presence of viral mutation(s) within the areas targeted by this assay, and inadequate number of viral copies(<138 copies/mL). A negative result must be combined with clinical observations, patient history, and epidemiological information. The expected result is Negative.  Fact Sheet for Patients:  BloggerCourse.com  Fact Sheet for Healthcare Providers:  SeriousBroker.it  This test is no                          t yet approved or cleared by the [place] FDA and  has been authorized for detection and/or diagnosis of SARS-CoV-2 by FDA under an Emergency Use Authorization (EUA). This EUA will remain  in effect (meaning this test can be used) for the duration of the COVID-19 declaration under Section 564(b)(1) of the Act, 21 U.S.C.section 360bbb-3(b)(1), unless the authorization is terminated  or revoked sooner.  Influenza A by PCR 02/05/2021 NEGATIVE  NEGATIVE Final   Influenza B by PCR 02/05/2021 NEGATIVE  NEGATIVE Final   Comment: (NOTE) The Xpert Xpress SARS-CoV-2/FLU/RSV plus assay is intended as an aid in the diagnosis of influenza from Nasopharyngeal swab  specimens and should not be used as a sole basis for treatment. Nasal washings and aspirates are unacceptable for Xpert Xpress SARS-CoV-2/FLU/RSV testing.  Fact Sheet for Patients: BloggerCourse.com  Fact Sheet for Healthcare Providers: SeriousBroker.it  This test is not yet approved or cleared by the [place] FDA and has been authorized for detection and/or diagnosis of SARS-CoV-2 by FDA under an Emergency Use Authorization (EUA). This EUA will remain in effect (meaning this test can be used) for the duration of the COVID-19 declaration under Section 564(b)(1) of the Act, 21 U.S.C. section 360bbb-3(b)(1), unless the authorization is terminated or revoked.  Performed at [facility], [address] [place], [place] [address]    SARS Coronavirus 2 Ag 02/05/2021 Negative  Negative Preliminary   WBC 02/05/2021 9.2  4.0 - 10.5 K/uL Final   RBC 02/05/2021 5.05  4.22 - 5.81 MIL/uL Final   Hemoglobin 02/05/2021 14.4  13.0 - 17.0 g/dL Final   HCT 60/45/4098 43.1  39.0 - 52.0 % Final   MCV 02/05/2021 85.3  80.0 - 100.0 fL Final   MCH 02/05/2021 28.5  26.0 - 34.0 pg Final   MCHC 02/05/2021 33.4  30.0 - 36.0 g/dL Final   RDW 11/91/4782 14.0  11.5 - 15.5 % Final   Platelets 02/05/2021 346  150 - 400 K/uL Final   nRBC 02/05/2021 0.0  0.0 - 0.2 % Final   Neutrophils Relative % 02/05/2021 59  % Final   Neutro Abs 02/05/2021 5.3  1.7 - 7.7 K/uL Final   Lymphocytes Relative 02/05/2021 29  % Final   Lymphs Abs 02/05/2021 2.7  0.7 - 4.0 K/uL Final   Monocytes Relative 02/05/2021 7  % Final   Monocytes Absolute 02/05/2021 0.7  0.1 - 1.0 K/uL Final   Eosinophils Relative 02/05/2021 5  % Final   Eosinophils Absolute 02/05/2021 0.5  0.0 - 0.5 K/uL Final   Basophils Relative 02/05/2021 0  % Final   Basophils Absolute 02/05/2021 0.0  0.0 - 0.1 K/uL Final   Immature Granulocytes 02/05/2021 0  % Final   Abs Immature Granulocytes 02/05/2021  0.03  0.00 - 0.07 K/uL Final   Performed at [facility], [address] [place], [place] [address]   Sodium 02/05/2021 138  135 - 145 mmol/L Final   Potassium 02/05/2021 4.0  3.5 - 5.1 mmol/L Final   Chloride 02/05/2021 102  98 - 111 mmol/L Final   CO2 02/05/2021 25  22 - 32 mmol/L Final   Glucose, Bld 02/05/2021 88  70 - 99 mg/dL Final   Glucose reference range applies only to samples taken after fasting for at least 8 hours.   BUN 02/05/2021 15  6 - 20 mg/dL Final   Creatinine, Ser 02/05/2021 0.76  0.61 - 1.24 mg/dL Final   Calcium 30/86/5784 9.8  8.9 - 10.3 mg/dL Final   Total Protein 69/62/9528 7.5  6.5 - 8.1 g/dL Final   Albumin 41/32/4401 4.3  3.5 - 5.0 g/dL Final   AST 02/72/5366 21  15 - 41 U/L Final   ALT 02/05/2021 16  0 - 44 U/L Final   Alkaline Phosphatase 02/05/2021 76  38 - 126 U/L Final   Total Bilirubin 02/05/2021 1.0  0.3 -  1.2 mg/dL Final   GFR, Estimated 02/05/2021 >60  >60 mL/min Final   Comment: (NOTE) Calculated using the CKD-EPI Creatinine Equation (2021)    Anion gap 02/05/2021 11  5 - 15 Final   Performed at [facility], [address] [place], [place] [address]   Alcohol, Ethyl (B) 02/05/2021 <10  <10 mg/dL Final   Comment: (NOTE) Lowest detectable limit for serum alcohol is 10 mg/dL.  For medical purposes only. Performed at [facility], [address], [place], [place] [address]    POC Amphetamine UR 02/05/2021 None Detected  NONE DETECTED (Cut Off Level 1000 ng/mL) Preliminary   POC Secobarbital (BAR) 02/05/2021 None Detected  NONE DETECTED (Cut Off Level 300 ng/mL) Preliminary   POC Buprenorphine (BUP) 02/05/2021 None Detected  NONE DETECTED (Cut Off Level 10 ng/mL) Preliminary   POC Oxazepam (BZO) 02/05/2021 None Detected  NONE DETECTED (Cut Off Level 300 ng/mL) Preliminary   POC Cocaine UR 02/05/2021 None Detected  NONE DETECTED (Cut Off Level 300 ng/mL) Preliminary   POC Methamphetamine UR 02/05/2021 None Detected  NONE  DETECTED (Cut Off Level 1000 ng/mL) Preliminary   POC Morphine 02/05/2021 None Detected  NONE DETECTED (Cut Off Level 300 ng/mL) Preliminary   POC Oxycodone UR 02/05/2021 None Detected  NONE DETECTED (Cut Off Level 100 ng/mL) Preliminary   POC Methadone UR 02/05/2021 None Detected  NONE DETECTED (Cut Off Level 300 ng/mL) Preliminary   POC Marijuana UR 02/05/2021 None Detected  NONE DETECTED (Cut Off Level 50 ng/mL) Preliminary   TSH 02/05/2021 5.750 (H)  0.350 - 4.500 uIU/mL Final   Comment: Performed by a 3rd Generation assay with a functional sensitivity of <=0.01 uIU/mL. Performed at [facility], [address], [place], [place] [address]    SARSCOV2ONAVIRUS 2 AG 02/05/2021 NEGATIVE  NEGATIVE Final   Comment: (NOTE) SARS-CoV-2 antigen NOT DETECTED.   Negative results are presumptive.  Negative results do not preclude SARS-CoV-2 infection and should not be used as the sole basis for treatment or other patient management decisions, including infection  control decisions, particularly in the presence of clinical signs and  symptoms consistent with COVID-19, or in those who have been in contact with the virus.  Negative results must be combined with clinical observations, patient history, and epidemiological information. The expected result is Negative.  Fact Sheet for Patients: https://www.jennings-kim.com/  Fact Sheet for Healthcare Providers: https://alexander-rogers.biz/  This test is not yet approved or cleared by the [place] FDA and  has been authorized for detection and/or diagnosis of SARS-CoV-2 by FDA under an Emergency Use Authorization (EUA).  This EUA will remain in effect (meaning this test can be used) for the duration of  the COV                          ID-19 declaration under Section 564(b)(1) of the Act, 21 U.S.C. section 360bbb-3(b)(1), unless the authorization is terminated or revoked sooner.     Free T4 02/05/2021 0.80   0.61 - 1.12 ng/dL Final   Comment: (NOTE) Biotin ingestion may interfere with free T4 tests. If the results are inconsistent with the TSH level, previous test results, or the clinical presentation, then consider biotin interference. If needed, order repeat testing after stopping biotin. Performed at [facility], [address], [place], [place] [address]    T3, Free 02/05/2021 3.8  2.0 - 4.4 pg/mL Final   Comment: (NOTE) Performed At:  [facility] [address] [place], [place] [id number] Jolene Schimke MD WU:[id number]   Admission on 02/04/2021, Discharged on 02/04/2021  Component Date Value Ref Range Status   SARS Coronavirus 2 by RT PCR 02/04/2021 NEGATIVE  NEGATIVE Final   Comment: (NOTE) SARS-CoV-2 target nucleic acids are NOT DETECTED.  The SARS-CoV-2 RNA is generally detectable in upper respiratory specimens during the acute phase of infection. The lowest concentration of SARS-CoV-2 viral copies this assay can detect is 138 copies/mL. A negative result does not preclude SARS-Cov-2 infection and should not be used as the sole basis for treatment or other patient management decisions. A negative result may occur with  improper specimen collection/handling, submission of specimen other than nasopharyngeal swab, presence of viral mutation(s) within the areas targeted by this assay, and inadequate number of viral copies(<138 copies/mL). A negative result must be combined with clinical observations, patient history, and epidemiological information. The expected result is Negative.  Fact Sheet for Patients:  BloggerCourse.com  Fact Sheet for Healthcare Providers:  SeriousBroker.it  This test is no                          t yet approved or cleared by the [place] FDA and  has been authorized for detection and/or diagnosis of SARS-CoV-2 by FDA under an Emergency Use Authorization (EUA). This EUA will  remain  in effect (meaning this test can be used) for the duration of the COVID-19 declaration under Section 564(b)(1) of the Act, 21 U.S.C.section 360bbb-3(b)(1), unless the authorization is terminated  or revoked sooner.       Influenza A by PCR 02/04/2021 NEGATIVE  NEGATIVE Final   Influenza B by PCR 02/04/2021 NEGATIVE  NEGATIVE Final   Comment: (NOTE) The Xpert Xpress SARS-CoV-2/FLU/RSV plus assay is intended as an aid in the diagnosis of influenza from Nasopharyngeal swab specimens and should not be used as a sole basis for treatment. Nasal washings and aspirates are unacceptable for Xpert Xpress SARS-CoV-2/FLU/RSV testing.  Fact Sheet for Patients: BloggerCourse.com  Fact Sheet for Healthcare Providers: SeriousBroker.it  This test is not yet approved or cleared by the [place] FDA and has been authorized for detection and/or diagnosis of SARS-CoV-2 by FDA under an Emergency Use Authorization (EUA). This EUA will remain in effect (meaning this test can be used) for the duration of the COVID-19 declaration under Section 564(b)(1) of the Act, 21 U.S.C. section 360bbb-3(b)(1), unless the authorization is terminated or revoked.  Performed at [facility], [address] [place], [place] [address]   Admission on 01/16/2021, Discharged on 01/24/2021  Component Date Value Ref Range Status   Cholesterol 01/17/2021 94  0 - 200 mg/dL Final   Triglycerides 30/86/5784 100  <150 mg/dL Final   HDL 69/62/9528 28 (L)  >40 mg/dL Final   Total CHOL/HDL Ratio 01/17/2021 3.4  RATIO Final   VLDL 01/17/2021 20  0 - 40 mg/dL Final   LDL Cholesterol 01/17/2021 46  0 - 99 mg/dL Final   Comment:        Total Cholesterol/HDL:CHD Risk Coronary Heart Disease Risk Table                     Men   Women  1/2 Average Risk   3.4   3.3  Average Risk       5.0   4.4  2 X Average Risk   9.6   7.1  3 X Average Risk  23.4   11.0         Use the calculated Patient Ratio above and the CHD Risk Table to determine the patient's CHD Risk.        ATP III CLASSIFICATION (LDL):  <100     mg/dL   Optimal  161-096  mg/dL   Near or Above                    Optimal  130-159  mg/dL   Borderline  045-409  mg/dL   High  >811     mg/dL   Very High Performed at [facility], [address] [place], [place] [address]    Hgb A1c MFr Bld 01/17/2021 6.0 (H)  4.8 - 5.6 % Final   Comment: (NOTE)         Prediabetes: 5.7 - 6.4         Diabetes: >6.4         Glycemic control for adults with diabetes: <7.0    Mean Plasma Glucose 01/17/2021 126  mg/dL Final   Comment: (NOTE) Performed At: [facility] [address] [place], [place] [id number] Jolene Schimke MD MV:[id number]   Admission on 01/16/2021, Discharged on 01/16/2021  Component Date Value Ref Range Status   Sodium 01/16/2021 134 (L)  135 - 145 mmol/L Final   Potassium 01/16/2021 3.8  3.5 - 5.1 mmol/L Final   Chloride 01/16/2021 96 (L)  98 - 111 mmol/L Final   CO2 01/16/2021 27  22 - 32 mmol/L Final   Glucose, Bld 01/16/2021 79  70 - 99 mg/dL Final   Glucose reference range applies only to samples taken after fasting for at least 8 hours.   BUN 01/16/2021 8  6 - 20 mg/dL Final   Creatinine, Ser 01/16/2021 0.83  0.61 - 1.24 mg/dL Final   Calcium 84/13/2440 9.3  8.9 - 10.3 mg/dL Final   Total Protein 02/21/2535 7.6  6.5 - 8.1 g/dL Final   Albumin 64/40/3474 4.0  3.5 - 5.0 g/dL Final   AST 25/95/6387 22  15 - 41 U/L Final   ALT 01/16/2021 18  0 - 44 U/L Final   Alkaline Phosphatase 01/16/2021 58  38 - 126 U/L Final   Total Bilirubin 01/16/2021 0.8  0.3 - 1.2 mg/dL Final   GFR, Estimated 01/16/2021 >60  >60 mL/min Final   Comment: (NOTE) Calculated using the CKD-EPI Creatinine Equation (2021)    Anion gap 01/16/2021 11  5 - 15 Final   Performed at [facility], [address] [place], [place] [address]   Alcohol, Ethyl (B) 01/16/2021 <10  <10  mg/dL Final   Comment: (NOTE) Lowest detectable limit for serum alcohol is 10 mg/dL.  For medical purposes only. Performed at [facility], [address] [place], [place] [address]    Salicylate Lvl 01/16/2021 <7.0 (L)  7.0 - 30.0 mg/dL Final   Performed at [facility], [address] [place], [place] [address]   Acetaminophen (Tylenol), Serum 01/16/2021 <10 (L)  10 - 30 ug/mL Final   Comment: (NOTE) Therapeutic concentrations vary significantly. A range of 10-30 ug/mL  may be an effective concentration for many patients. However, some  are best treated at concentrations outside of this range. Acetaminophen concentrations >150 ug/mL at 4 hours after ingestion  and >50 ug/mL at 12 hours after ingestion are often associated with  toxic reactions.  Performed at [facility], [address] [place], [place] [address]  WBC 01/16/2021 8.8  4.0 - 10.5 K/uL Final   RBC 01/16/2021 4.89  4.22 - 5.81 MIL/uL Final   Hemoglobin 01/16/2021 14.4  13.0 - 17.0 g/dL Final   HCT 16/01/9603 41.7  39.0 - 52.0 % Final   MCV 01/16/2021 85.3  80.0 - 100.0 fL Final   MCH 01/16/2021 29.4  26.0 - 34.0 pg Final   MCHC 01/16/2021 34.5  30.0 - 36.0 g/dL Final   RDW 54/12/8117 13.5  11.5 - 15.5 % Final   Platelets 01/16/2021 402 (H)  150 - 400 K/uL Final   nRBC 01/16/2021 0.0  0.0 - 0.2 % Final   Performed at [facility], [address] [place], [place] [address]   Tricyclic, Ur Screen 01/16/2021 NONE DETECTED  NONE DETECTED Final   Amphetamines, Ur Screen 01/16/2021 POSITIVE (A)  NONE DETECTED Final   MDMA (Ecstasy)Ur Screen 01/16/2021 NONE DETECTED  NONE DETECTED Final   Cocaine Metabolite,Ur [place] 01/16/2021 NONE DETECTED  NONE DETECTED Final   Opiate, Ur Screen 01/16/2021 NONE DETECTED  NONE DETECTED Final   Phencyclidine (PCP) Ur S 01/16/2021 NONE DETECTED  NONE DETECTED Final   Cannabinoid 50 Ng, Ur [place] 01/16/2021 NONE DETECTED  NONE DETECTED Final    Barbiturates, Ur Screen 01/16/2021 NONE DETECTED  NONE DETECTED Final   Benzodiazepine, Ur Scrn 01/16/2021 NONE DETECTED  NONE DETECTED Final   Methadone Scn, Ur 01/16/2021 NONE DETECTED  NONE DETECTED Final   Comment: (NOTE) Tricyclics + metabolites, urine    Cutoff 1000 ng/mL Amphetamines + metabolites, urine  Cutoff 1000 ng/mL MDMA (Ecstasy), urine              Cutoff 500 ng/mL Cocaine Metabolite, urine          Cutoff 300 ng/mL Opiate + metabolites, urine        Cutoff 300 ng/mL Phencyclidine (PCP), urine         Cutoff 25 ng/mL Cannabinoid, urine                 Cutoff 50 ng/mL Barbiturates + metabolites, urine  Cutoff 200 ng/mL Benzodiazepine, urine              Cutoff 200 ng/mL Methadone, urine                   Cutoff 300 ng/mL  The urine drug screen provides only a preliminary, unconfirmed analytical test result and should not be used for non-medical purposes. Clinical consideration and professional judgment should be applied to any positive drug screen result due to possible interfering substances. A more specific alternate chemical method must be used in order to obtain a confirmed analytical result. Gas chromatography / mass spectrometry (GC/MS) is the preferred confirm                          atory method. Performed at [facility], [address] [place], [place] [address]    SARS Coronavirus 2 by RT PCR 01/16/2021 NEGATIVE  NEGATIVE Final   Comment: (NOTE) SARS-CoV-2 target nucleic acids are NOT DETECTED.  The SARS-CoV-2 RNA is generally detectable in upper respiratory specimens during the acute phase of infection. The lowest concentration of SARS-CoV-2 viral copies this assay can detect is 138 copies/mL. A negative result does not preclude SARS-Cov-2 infection and should not be used as the sole basis for treatment or other patient management decisions. A negative result may occur with  improper specimen collection/handling, submission of specimen  other than nasopharyngeal swab, presence of viral mutation(s) within the areas targeted by this assay, and inadequate number of viral copies(<138 copies/mL). A negative result must be combined with clinical observations, patient history, and epidemiological information. The expected result is Negative.  Fact Sheet for Patients:  BloggerCourse.com  Fact Sheet for Healthcare Providers:  SeriousBroker.it  This test is no                          t yet approved or cleared by the [place] FDA and  has been authorized for detection and/or diagnosis of SARS-CoV-2 by FDA under an Emergency Use Authorization (EUA). This EUA will remain  in effect (meaning this test can be used) for the duration of the COVID-19 declaration under Section 564(b)(1) of the Act, 21 U.S.C.section 360bbb-3(b)(1), unless the authorization is terminated  or revoked sooner.       Influenza A by PCR 01/16/2021 NEGATIVE  NEGATIVE Final   Influenza B by PCR 01/16/2021 NEGATIVE  NEGATIVE Final   Comment: (NOTE) The Xpert Xpress SARS-CoV-2/FLU/RSV plus assay is intended as an aid in the diagnosis of influenza from Nasopharyngeal swab specimens and should not be used as a sole basis for treatment. Nasal washings and aspirates are unacceptable for Xpert Xpress SARS-CoV-2/FLU/RSV testing.  Fact Sheet for Patients: BloggerCourse.com  Fact Sheet for Healthcare Providers: SeriousBroker.it  This test is not yet approved or cleared by the [place] FDA and has been authorized for detection and/or diagnosis of SARS-CoV-2 by FDA under an Emergency Use Authorization (EUA). This EUA will remain in effect (meaning this test can be used) for the duration of the COVID-19 declaration under Section 564(b)(1) of the Act, 21 U.S.C. section 360bbb-3(b)(1), unless the authorization is terminated or revoked.  Performed at  [facility], [address] [place], [place] [address]   There may be more visits with results that are not included.    Blood Alcohol level:  Lab Results  Component Value Date   ETH <10 04/14/2021   ETH <10 02/22/2021    Metabolic Disorder Labs: Lab Results  Component Value Date   HGBA1C 6.0 (H) 01/17/2021   MPG 126 01/17/2021   MPG 126 10/08/2020   No results found for: PROLACTIN Lab Results  Component Value Date   CHOL 94 01/17/2021   TRIG 100 01/17/2021   HDL 28 (L) 01/17/2021   CHOLHDL 3.4 01/17/2021   VLDL 20 01/17/2021   LDLCALC 46 01/17/2021   LDLCALC 49 10/08/2020    Therapeutic Lab Levels: No results found for: LITHIUM Lab Results  Component Value Date   VALPROATE 62 10/16/2019   No components found for:  CBMZ  Physical Findings   AIMS    Flowsheet Row Admission (Discharged) from 01/16/2021 in [facility]) [facility]) [facility]  AIMS Total Score 0 0 0      AUDIT    Flowsheet Row Admission (Discharged) from 01/16/2021 in [facility] Admission (Discharged) [facility] (Discharged[facility] Admission (Discharged) from 10/10/2019 in [facility] INPATIENT BEHAVIORAL MEDICINE Admission (Discharged) from 09/14/2019 in [facility] INPATIENT BEHAVIORAL MEDICINE  Alcohol Use Disorder Identification Test Final Score (AUDIT) 1 34 15 0 0      PHQ2-9    Flowsheet Row [facility]  [facility]/14[facility] 10/28/2020 [facility]  PHQ-2 Total Score 2 4  2 4 3   PHQ-9 Total Score 16 9 6 8 10       [facility] 04/05/2021 in [facility]  C-SSRS RISK CATEGORY Low Risk High Risk No Risk        Musculoskeletal  Strength & Muscle Tone: within normal limits Gait & Station: normal Patient leans: N/A  Psychiatric Specialty Exam  Presentation  General Appearance: Appropriate for Environment; Casual  Eye Contact:Good  Speech:Clear and Coherent; Normal Rate  Speech Volume:Normal  Handedness:Right   Mood and Affect  Mood:Depressed  Affect:Congruent   Thought Process  Thought Processes:Coherent  Descriptions of Associations:Intact  Orientation:Full (Time, Place and Person)  Thought Content:Logical  Diagnosis of Schizophrenia or Schizoaffective disorder in past: No    Hallucinations:Hallucinations: None  Ideas of Reference:None  Suicidal Thoughts:Suicidal Thoughts: No SI Passive Intent and/or Plan: Without Intent; Without Plan; Without Means to Carry Out  Homicidal Thoughts:Homicidal Thoughts: No   Sensorium  Memory:Immediate Good; Recent Good; Remote Good  Judgment:Fair  Insight:Fair   Executive Functions  Concentration:Good  Attention Span:Good  Recall:Good  Fund of Knowledge:Good  Language:Good   Psychomotor Activity  Psychomotor Activity:Psychomotor Activity: Normal   Assets  Assets:Communication Skills; Desire for Improvement; Leisure Time; Physical Health; Resilience; Talents/Skills   Sleep  Sleep:Sleep: Fair   No data recorded  Physical Exam  Physical Exam Vitals and nursing note reviewed.  Cardiovascular:     Rate and Rhythm: Regular rhythm.  Pulmonary:     Effort: Pulmonary effort is normal.  Neurological:     Mental Status: He is oriented to person, place, and time.  Psychiatric:        Mood and Affect: Mood normal.         Behavior: Behavior normal.   Review of Systems  Cardiovascular: Negative.   Gastrointestinal: Negative.   Psychiatric/Behavioral:  Negative for depression and suicidal ideas. The patient is nervous/anxious.   All other systems reviewed and are negative. Blood pressure 117/63, pulse 86, temperature 98.3 F (36.8 C), temperature source Oral, resp. rate 14, SpO2 99 %. There is no height or weight on file to calculate BMI.  Treatment Plan Summary: Daily contact with patient to assess and evaluate symptoms and progress in treatment and Medication management  Continue with current treatment plan on 04/20/2021 as listed below except were noted  Posttraumatic stress disorder: Major depressive disorder: Substance-induced mood disorder:  Continue Zoloft 50 mg p.o. daily Increase Seroquel 25 mg p.o. to 50 mg p.o. nightly Continue hydroxyzine 25 mg p.o. 3 times daily as needed  Sexually transmitted infection: Labs: urine pending-Chlamydia testing   CSW to continue working on discharge disposition anticipated discharge 12/2Rennis Goldenen, NP 04/20/2021 11:23 AM

## 2021-04-20 NOTE — ED Notes (Signed)
Pt sleeping@this time. Breathing even and unlabored. Will continue to monitor for safety 

## 2021-04-21 DIAGNOSIS — F1994 Other psychoactive substance use, unspecified with psychoactive substance-induced mood disorder: Secondary | ICD-10-CM | POA: Diagnosis not present

## 2021-04-21 DIAGNOSIS — F332 Major depressive disorder, recurrent severe without psychotic features: Secondary | ICD-10-CM | POA: Diagnosis not present

## 2021-04-21 DIAGNOSIS — R45851 Suicidal ideations: Secondary | ICD-10-CM | POA: Diagnosis not present

## 2021-04-21 DIAGNOSIS — F17210 Nicotine dependence, cigarettes, uncomplicated: Secondary | ICD-10-CM | POA: Diagnosis not present

## 2021-04-21 NOTE — ED Notes (Signed)
Patient denies SI,HI,AVH. Patient is cooperative and interacts well with staff. Respiratory is even and unlabored. No acute distress noted. Patient is watching TV at present. Patient stated no complaints at present. will continue to monitor for safety.

## 2021-04-21 NOTE — ED Notes (Signed)
Snack provided

## 2021-04-21 NOTE — Progress Notes (Signed)
Patient sleeping. Continue to monitor for safety. °

## 2021-04-21 NOTE — ED Provider Notes (Signed)
Behavioral Health Progress Note  Date and Time: 04/21/2021 1:38 PM Name: Nathan Koch MRN:  [id number]  Subjective:  Nathan Koch reported  " I am looking forward to rehab"  Evaluation: Patient observed resting in bed.  He is awake, alert and oriented x3.  Denying suicidal or homicidal ideations.  Denies auditory or visual hallucination.  Reports he is excited about plans to follow-up with [facility] residential treatment tomorrow; believes he is leaving at 25.  He denies depression or depressive symptoms. Endorsed good sleep after most recent quetiapine OD. Asks after results of STI screening and asks if these can be conveyed to his wife to "make her chill out"; let him know he would be provided a print out of his results when they were available. Reporting good appetite.    Reports longest prior period of sobriety was 9 months; reflected on causes of relapse and brainstormed ways to avoid in future. No SI/HI/AH/VH.  Nathan Koch reports concerns with possible STI infection.  States " my wife told me that she was positive for chlamydia and we had sex prior to me coming in."  Nathan Koch is denying pain with urination, burning, genital itching, discharge or odor.  orders placed for STI -testing pending results.Patient declined HIV or RPR.  Reports a good appetite.  States he is resting " okay, I was wondering if I could have an increase on the Seroquel."    Denied ruminations or nightmares.  We will titrate Seroquel  to 50 mg p.o. nightly. Patient was receptive to plan. [name] is denying cravings or withdrawal symptoms currently.Chart reviewed no charted CIWA/COWS scores noted. Support, encouragement  and reassurance was provided.  Diagnosis:  Final diagnoses:  Severe episode of recurrent major depressive disorder, without psychotic features ([name])  Methamphetamine use disorder, moderate ([name])  Amphetamine-induced mood disorder ([name])  Passive suicidal ideations  Homelessness    Total Time spent with patient:  15 minutes  Past Psychiatric History:  Past Medical History:  Past Medical History:  Diagnosis Date   Anxiety    Anxiety disorder    Asthma    Bipolar 1 disorder ([name])    Depression    Methamphetamine abuse ([name])    PTSD (post-traumatic stress disorder)     Past Surgical History:  Procedure Laterality Date   CLOSED REDUCTION MANDIBLE N/A 07/02/2018   Procedure: CLOSED REDUCTION MANDIBULAR;  Surgeon: Vernie Murders, MD;  Location: [facility] ORS;  Service: ENT;  Laterality: N/A;   Family History:  Family History  Problem Relation Age of Onset   Asthma Mother    Cancer Mother        breast cancer   Ulcers Mother    Cancer Father        esophogeal cancer   Hypertension Father    Asthma Brother    Family Psychiatric  History:  Social History:  Social History   Substance and Sexual Activity  Alcohol Use Not Currently   Alcohol/week: 13.0 standard drinks   Types: 6 Cans of beer, 7 Standard drinks or equivalent per week   Comment: previously heavy drinker 2016. most recent 4 beer/ day drinker and none since 01-11-2020     Social History   Substance and Sexual Activity  Drug Use Yes   Types: Methamphetamines   Comment: states he uses 1 g every other day    Social History   Socioeconomic History   Marital status: Single    Spouse name: Not on file   Number of children: 1   Years of  education: 10   Highest education level: 10th grade  Occupational History   Not on file  Tobacco Use   Smoking status: Every Day    Packs/day: 0.50    Years: 8.00    Pack years: 4.00    Types: Cigarettes   Smokeless tobacco: Never  Vaping Use   Vaping Use: Every day   Substances: Nicotine, Flavoring  Substance and Sexual Activity   Alcohol use: Not Currently    Alcohol/week: 13.0 standard drinks    Types: 6 Cans of beer, 7 Standard drinks or equivalent per week    Comment: previously heavy drinker 2016. most recent 4 beer/ day drinker and none since 01-11-2020   Drug use: Yes     Types: Methamphetamines    Comment: states he uses 1 g every other day   Sexual activity: Not on file  Other Topics Concern   Not on file  Social History Narrative   Not on file   Social Determinants of Health   Financial Resource Strain: Not on file  Food Insecurity: Not on file  Transportation Needs: Not on file  Physical Activity: Not on file  Stress: Not on file  Social Connections: Not on file   SDOH:  SDOH Screenings   Alcohol Screen: Low Risk    Last Alcohol Screening Score (AUDIT): 1  Depression (PHQ2-9): Medium Risk   PHQ-2 Score: 16  Financial Resource Strain: Not on file  Food Insecurity: Not on file  Housing: Not on file  Physical Activity: Not on file  Social Connections: Not on file  Stress: Not on file  Tobacco Use: High Risk   Smoking Tobacco Use: Every Day   Smokeless Tobacco Use: Never   Passive Exposure: Not on file  Transportation Needs: Not on file   Additional Social History:                         Sleep: Negative  Appetite:  Fair  Current Medications:  Current Facility-Administered Medications  Medication Dose Route Frequency Provider Last Rate Last Admin   acetaminophen (TYLENOL) tablet 650 mg  650 mg Oral Q6H PRN Rankin, Shuvon B, NP   650 mg at 04/18/21 2109   albuterol (VENTOLIN HFA) 108 (90 Base) MCG/ACT inhaler 2 puff  2 puff Inhalation Q4H PRN Jaclyn Shaggy, PA-C   2 puff at 04/21/21 1307   alum & mag hydroxide-simeth (MAALOX/MYLANTA) 200-200-20 MG/5ML suspension 30 mL  30 mL Oral Q4H PRN Rankin, Shuvon B, NP       hydrOXYzine (ATARAX) tablet 25 mg  25 mg Oral TID PRN Rankin, Shuvon B, NP       magnesium hydroxide (MILK OF MAGNESIA) suspension 30 mL  30 mL Oral Daily PRN Rankin, Shuvon B, NP       menthol-cetylpyridinium (CEPACOL) lozenge 3 mg  1 lozenge Oral Q2H PRN Ardis Hughs, NP   3 mg at 04/19/21 1143   nicotine (NICODERM CQ - dosed in mg/24 hours) patch 14 mg  14 mg Transdermal Daily Oneta Rack, NP        QUEtiapine (SEROQUEL) tablet 50 mg  50 mg Oral QHS Oneta Rack, NP   50 mg at 04/20/21 2104   sertraline (ZOLOFT) tablet 50 mg  50 mg Oral QHS Melbourne Abts W, PA-C   50 mg at 04/20/21 2104   sodium chloride (OCEAN) 0.65 % nasal spray 1 spray  1 spray Each Nare PRN Jaclyn Shaggy, PA-C  traZODone (DESYREL) tablet 50 mg  50 mg Oral QHS PRN Jaclyn Shaggy, PA-C   50 mg at 04/17/21 2107   Current Outpatient Medications  Medication Sig Dispense Refill   albuterol (VENTOLIN HFA) 108 (90 Base) MCG/ACT inhaler Inhale 2 puffs into the lungs every 4 (four) hours as needed for wheezing or shortness of breath. (Patient not taking: Reported on 02/12/2021) 6.7 g 1   sertraline (ZOLOFT) 50 MG tablet Take 1 tablet (50 mg total) by mouth at bedtime. (Patient not taking: Reported on 04/15/2021) 7 tablet 0    Labs  Lab Results:  Admission on 04/14/2021, Discharged on 04/15/2021  Component Date Value Ref Range Status   SARS Coronavirus 2 by RT PCR 04/14/2021 NEGATIVE  NEGATIVE Final   Comment: (NOTE) SARS-CoV-2 target nucleic acids are NOT DETECTED.  The SARS-CoV-2 RNA is generally detectable in upper respiratory specimens during the acute phase of infection. The lowest concentration of SARS-CoV-2 viral copies this assay can detect is 138 copies/mL. A negative result does not preclude SARS-Cov-2 infection and should not be used as the sole basis for treatment or other patient management decisions. A negative result may occur with  improper specimen collection/handling, submission of specimen other than nasopharyngeal swab, presence of viral mutation(s) within the areas targeted by this assay, and inadequate number of viral copies(<138 copies/mL). A negative result must be combined with clinical observations, patient history, and epidemiological information. The expected result is Negative.  Fact Sheet for Patients:  BloggerCourse.com  Fact Sheet for Healthcare  Providers:  SeriousBroker.it  This test is no                          t yet approved or cleared by the [place] FDA and  has been authorized for detection and/or diagnosis of SARS-CoV-2 by FDA under an Emergency Use Authorization (EUA). This EUA will remain  in effect (meaning this test can be used) for the duration of the COVID-19 declaration under Section 564(b)(1) of the Act, 21 U.S.C.section 360bbb-3(b)(1), unless the authorization is terminated  or revoked sooner.       Influenza A by PCR 04/14/2021 NEGATIVE  NEGATIVE Final   Influenza B by PCR 04/14/2021 NEGATIVE  NEGATIVE Final   Comment: (NOTE) The Xpert Xpress SARS-CoV-2/FLU/RSV plus assay is intended as an aid in the diagnosis of influenza from Nasopharyngeal swab specimens and should not be used as a sole basis for treatment. Nasal washings and aspirates are unacceptable for Xpert Xpress SARS-CoV-2/FLU/RSV testing.  Fact Sheet for Patients: BloggerCourse.com  Fact Sheet for Healthcare Providers: SeriousBroker.it  This test is not yet approved or cleared by the [place] FDA and has been authorized for detection and/or diagnosis of SARS-CoV-2 by FDA under an Emergency Use Authorization (EUA). This EUA will remain in effect (meaning this test can be used) for the duration of the COVID-19 declaration under Section 564(b)(1) of the Act, 21 U.S.C. section 360bbb-3(b)(1), unless the authorization is terminated or revoked.  Performed at [facility], [address] [place] [address], [place], [place] [address]    Sodium 04/14/2021 134 (L)  135 - 145 mmol/L Final   Potassium 04/14/2021 3.6  3.5 - 5.1 mmol/L Final   Chloride 04/14/2021 94 (L)  98 - 111 mmol/L Final   CO2 04/14/2021 28  22 - 32 mmol/L Final   Glucose, Bld 04/14/2021 119 (H)  70 - 99 mg/dL Final   Glucose reference range applies only to samples taken after fasting for  at least 8 hours.   BUN  04/14/2021 14  6 - 20 mg/dL Final   Creatinine, Ser 04/14/2021 0.77  0.61 - 1.24 mg/dL Final   Calcium 70/92/9574 9.7  8.9 - 10.3 mg/dL Final   Total Protein 73/40/3709 8.8 (H)  6.5 - 8.1 g/dL Final   Albumin 64/38/3818 4.4  3.5 - 5.0 g/dL Final   AST 40/37/5436 47 (H)  15 - 41 U/L Final   ALT 04/14/2021 46 (H)  0 - 44 U/L Final   Alkaline Phosphatase 04/14/2021 71  38 - 126 U/L Final   Total Bilirubin 04/14/2021 1.0  0.3 - 1.2 mg/dL Final   GFR, Estimated 04/14/2021 >60  >60 mL/min Final   Comment: (NOTE) Calculated using the CKD-EPI Creatinine Equation (2021)    Anion gap 04/14/2021 12  5 - 15 Final   Performed at [facility], [address], [place], [place] [address]   Alcohol, Ethyl (B) 04/14/2021 <10  <10 mg/dL Final   Comment: (NOTE) Lowest detectable limit for serum alcohol is 10 mg/dL.  For medical purposes only. Performed at [facility], [address] [place], [place] [address]    Salicylate Lvl 04/14/2021 <7.0 (L)  7.0 - 30.0 mg/dL Final   Performed at [facility], [address] [place], [place] [address]   Acetaminophen (Tylenol), Serum 04/14/2021 <10 (L)  10 - 30 ug/mL Final   Comment: (NOTE) Therapeutic concentrations vary significantly. A range of 10-30 ug/mL  may be an effective concentration for many patients. However, some  are best treated at concentrations outside of this range. Acetaminophen concentrations >150 ug/mL at 4 hours after ingestion  and >50 ug/mL at 12 hours after ingestion are often associated with  toxic reactions.  Performed at [facility], [address] [place], [place] [address]    WBC 04/14/2021 12.6 (H)  4.0 - 10.5 K/uL Final   RBC 04/14/2021 5.35  4.22 - 5.81 MIL/uL Final   Hemoglobin 04/14/2021 15.7  13.0 - 17.0 g/dL Final   HCT 03/18/6243 46.9  39.0 - 52.0 % Final   MCV 04/14/2021 87.7  80.0 - 100.0 fL Final   MCH 04/14/2021 29.3  26.0 - 34.0 pg Final   MCHC 04/14/2021 33.5  30.0 - 36.0 g/dL Final    RDW 69/50/7225 13.5  11.5 - 15.5 % Final   Platelets 04/14/2021 481 (H)  150 - 400 K/uL Final   nRBC 04/14/2021 0.0  0.0 - 0.2 % Final   Performed at [facility], [address] [place], [place] [address]   Opiates 04/14/2021 NONE DETECTED  NONE DETECTED Final   Cocaine 04/14/2021 NONE DETECTED  NONE DETECTED Final   Benzodiazepines 04/14/2021 NONE DETECTED  NONE DETECTED Final   Amphetamines 04/14/2021 POSITIVE (A)  NONE DETECTED Final   Tetrahydrocannabinol 04/14/2021 NONE DETECTED  NONE DETECTED Final   Barbiturates 04/14/2021 NONE DETECTED  NONE DETECTED Final   Comment: (NOTE) DRUG SCREEN FOR MEDICAL PURPOSES ONLY.  IF CONFIRMATION IS NEEDED FOR ANY PURPOSE, NOTIFY LAB WITHIN 5 DAYS.  LOWEST DETECTABLE LIMITS FOR URINE DRUG SCREEN Drug Class                     Cutoff (ng/mL) Amphetamine and metabolites    1000 Barbiturate and metabolites    200 Benzodiazepine                 200 Tricyclics and metabolites     300 Opiates and metabolites  300 Cocaine and metabolites        300 THC                            50 Performed at [facility], [address], [place], [place] [address]   Admission on 04/05/2021, Discharged on 04/05/2021  Component Date Value Ref Range Status   Lipase 04/05/2021 24  11 - 51 U/L Final   Performed at [facility], [address], [place], [place] [address]   Sodium 04/05/2021 140  135 - 145 mmol/L Final   Potassium 04/05/2021 3.8  3.5 - 5.1 mmol/L Final   Chloride 04/05/2021 105  98 - 111 mmol/L Final   CO2 04/05/2021 28  22 - 32 mmol/L Final   Glucose, Bld 04/05/2021 88  70 - 99 mg/dL Final   Glucose reference range applies only to samples taken after fasting for at least 8 hours.   BUN 04/05/2021 10  6 - 20 mg/dL Final   Creatinine, Ser 04/05/2021 0.85  0.61 - 1.24 mg/dL Final   Calcium 09/81/1914 9.9  8.9 - 10.3 mg/dL Final   Total Protein 78/29/5621 7.9  6.5 - 8.1 g/dL Final   Albumin 30/86/5784 4.6  3.5 - 5.0 g/dL  Final   AST 69/62/9528 13 (L)  15 - 41 U/L Final   ALT 04/05/2021 11  0 - 44 U/L Final   Alkaline Phosphatase 04/05/2021 65  38 - 126 U/L Final   Total Bilirubin 04/05/2021 0.8  0.3 - 1.2 mg/dL Final   GFR, Estimated 04/05/2021 >60  >60 mL/min Final   Comment: (NOTE) Calculated using the CKD-EPI Creatinine Equation (2021)    Anion gap 04/05/2021 7  5 - 15 Final   Performed at [facility], [address], [place], [place] [address]   WBC 04/05/2021 6.5  4.0 - 10.5 K/uL Final   RBC 04/05/2021 5.25  4.22 - 5.81 MIL/uL Final   Hemoglobin 04/05/2021 14.6  13.0 - 17.0 g/dL Final   HCT 40/01/2724 45.2  39.0 - 52.0 % Final   MCV 04/05/2021 86.1  80.0 - 100.0 fL Final   MCH 04/05/2021 27.8  26.0 - 34.0 pg Final   MCHC 04/05/2021 32.3  30.0 - 36.0 g/dL Final   RDW 36/64/4034 13.9  11.5 - 15.5 % Final   Platelets 04/05/2021 324  150 - 400 K/uL Final   nRBC 04/05/2021 0.0  0.0 - 0.2 % Final   Performed at [facility], [address] [place], [place] [address]   SARS Coronavirus 2 by RT PCR 04/05/2021 NEGATIVE  NEGATIVE Final   Comment: (NOTE) SARS-CoV-2 target nucleic acids are NOT DETECTED.  The SARS-CoV-2 RNA is generally detectable in upper respiratory specimens during the acute phase of infection. The lowest concentration of SARS-CoV-2 viral copies this assay can detect is 138 copies/mL. A negative result does not preclude SARS-Cov-2 infection and should not be used as the sole basis for treatment or other patient management decisions. A negative result may occur with  improper specimen collection/handling, submission of specimen other than nasopharyngeal swab, presence of viral mutation(s) within the areas targeted by this assay, and inadequate number of viral copies(<138 copies/mL). A negative result must be combined with clinical observations, patient history, and epidemiological information. The expected result is Negative.  Fact Sheet for  Patients:  BloggerCourse.com  Fact Sheet for Healthcare Providers:  SeriousBroker.it  This test is no  t yet approved or cleared by the [place] and  has been authorized for detection and/or diagnosis of SARS-CoV-2 by FDA under an Emergency Use Authorization (EUA). This EUA will remain  in effect (meaning this test can be used) for the duration of the COVID-19 declaration under Section 564(b)(1) of the Act, 21 U.S.C.section 360bbb-3(b)(1), unless the authorization is terminated  or revoked sooner.       Influenza A by PCR 04/05/2021 NEGATIVE  NEGATIVE Final   Influenza B by PCR 04/05/2021 NEGATIVE  NEGATIVE Final   Comment: (NOTE) The Xpert Xpress SARS-CoV-2/FLU/RSV plus assay is intended as an aid in the diagnosis of influenza from Nasopharyngeal swab specimens and should not be used as a sole basis for treatment. Nasal washings and aspirates are unacceptable for Xpert Xpress SARS-CoV-2/FLU/RSV testing.  Fact Sheet for Patients: BloggerCourse.com  Fact Sheet for Healthcare Providers: SeriousBroker.it  This test is not yet approved or cleared by the [place] FDA and has been authorized for detection and/or diagnosis of SARS-CoV-2 by FDA under an Emergency Use Authorization (EUA). This EUA will remain in effect (meaning this test can be used) for the duration of the COVID-19 declaration under Section 564(b)(1) of the Act, 21 U.S.C. section 360bbb-3(b)(1), unless the authorization is terminated or revoked.  Performed at [facility], [address] [place], [place] [address]   Admission on 02/22/2021, Discharged on 02/22/2021  Component Date Value Ref Range Status   Sodium 02/22/2021 133 (L)  135 - 145 mmol/L Final   Potassium 02/22/2021 3.8  3.5 - 5.1 mmol/L Final   Chloride 02/22/2021 93 (L)  98 - 111 mmol/L Final    CO2 02/22/2021 30  22 - 32 mmol/L Final   Glucose, Bld 02/22/2021 116 (H)  70 - 99 mg/dL Final   Glucose reference range applies only to samples taken after fasting for at least 8 hours.   BUN 02/22/2021 26 (H)  6 - 20 mg/dL Final   Creatinine, Ser 02/22/2021 0.95  0.61 - 1.24 mg/dL Final   Calcium 60/45/4098 9.7  8.9 - 10.3 mg/dL Final   Total Protein 11/91/4782 8.6 (H)  6.5 - 8.1 g/dL Final   Albumin 95/62/1308 4.7  3.5 - 5.0 g/dL Final   AST 65/78/4696 51 (H)  15 - 41 U/L Final   ALT 02/22/2021 34  0 - 44 U/L Final   Alkaline Phosphatase 02/22/2021 70  38 - 126 U/L Final   Total Bilirubin 02/22/2021 2.6 (H)  0.3 - 1.2 mg/dL Final   GFR, Estimated 02/22/2021 >60  >60 mL/min Final   Comment: (NOTE) Calculated using the CKD-EPI Creatinine Equation (2021)    Anion gap 02/22/2021 10  5 - 15 Final   Performed at [facility], [address] [place], [place] [address]   Alcohol, Ethyl (B) 02/22/2021 <10  <10 mg/dL Final   Comment: (NOTE) Lowest detectable limit for serum alcohol is 10 mg/dL.  For medical purposes only. Performed at [facility], [address] [place], [place] [address]    WBC 02/22/2021 11.4 (H)  4.0 - 10.5 K/uL Final   RBC 02/22/2021 5.00  4.22 - 5.81 MIL/uL Final   Hemoglobin 02/22/2021 14.6  13.0 - 17.0 g/dL Final   HCT 40/01/2724 41.4  39.0 - 52.0 % Final   MCV 02/22/2021 82.8  80.0 - 100.0 fL Final   MCH 02/22/2021 29.2  26.0 - 34.0 pg Final   MCHC 02/22/2021 35.3  30.0 - 36.0 g/dL Final  RDW 02/22/2021 13.1  11.5 - 15.5 % Final   Platelets 02/22/2021 399  150 - 400 K/uL Final   nRBC 02/22/2021 0.0  0.0 - 0.2 % Final   Performed at [facility], [address] [place], [place] [address]   Tricyclic, Ur Screen 02/22/2021 NONE DETECTED  NONE DETECTED Final   Amphetamines, Ur Screen 02/22/2021 POSITIVE (A)  NONE DETECTED Final   MDMA (Ecstasy)Ur Screen 02/22/2021 NONE DETECTED  NONE DETECTED Final   Cocaine Metabolite,Ur [place]  02/22/2021 NONE DETECTED  NONE DETECTED Final   Opiate, Ur Screen 02/22/2021 NONE DETECTED  NONE DETECTED Final   Phencyclidine (PCP) Ur S 02/22/2021 NONE DETECTED  NONE DETECTED Final   Cannabinoid 50 Ng, Ur [place] 02/22/2021 POSITIVE (A)  NONE DETECTED Final   Barbiturates, Ur Screen 02/22/2021 NONE DETECTED  NONE DETECTED Final   Benzodiazepine, Ur Scrn 02/22/2021 NONE DETECTED  NONE DETECTED Final   Methadone Scn, Ur 02/22/2021 NONE DETECTED  NONE DETECTED Final   Comment: (NOTE) Tricyclics + metabolites, urine    Cutoff 1000 ng/mL Amphetamines + metabolites, urine  Cutoff 1000 ng/mL MDMA (Ecstasy), urine              Cutoff 500 ng/mL Cocaine Metabolite, urine          Cutoff 300 ng/mL Opiate + metabolites, urine        Cutoff 300 ng/mL Phencyclidine (PCP), urine         Cutoff 25 ng/mL Cannabinoid, urine                 Cutoff 50 ng/mL Barbiturates + metabolites, urine  Cutoff 200 ng/mL Benzodiazepine, urine              Cutoff 200 ng/mL Methadone, urine                   Cutoff 300 ng/mL  The urine drug screen provides only a preliminary, unconfirmed analytical test result and should not be used for non-medical purposes. Clinical consideration and professional judgment should be applied to any positive drug screen result due to possible interfering substances. A more specific alternate chemical method must be used in order to obtain a confirmed analytical result. Gas chromatography / mass spectrometry (GC/MS) is the preferred confirm                          atory method. Performed at [facility], [address] [place], [place] [address]   Admission on 02/18/2021, Discharged on 02/18/2021  Component Date Value Ref Range Status   WBC 02/18/2021 15.0 (H)  4.0 - 10.5 K/uL Final   RBC 02/18/2021 5.61  4.22 - 5.81 MIL/uL Final   Hemoglobin 02/18/2021 15.9  13.0 - 17.0 g/dL Final   HCT 19/14/7829 46.8  39.0 - 52.0 % Final   MCV 02/18/2021 83.4  80.0 - 100.0 fL Final    MCH 02/18/2021 28.3  26.0 - 34.0 pg Final   MCHC 02/18/2021 34.0  30.0 - 36.0 g/dL Final   RDW 56/21/3086 13.3  11.5 - 15.5 % Final   Platelets 02/18/2021 430 (H)  150 - 400 K/uL Final   nRBC 02/18/2021 0.0  0.0 - 0.2 % Final   Neutrophils Relative % 02/18/2021 74  % Final   Neutro Abs 02/18/2021 11.1 (H)  1.7 - 7.7 K/uL Final   Lymphocytes Relative 02/18/2021 17  % Final   Lymphs Abs 02/18/2021 2.5  0.7 - 4.0 K/uL  Final   Monocytes Relative 02/18/2021 9  % Final   Monocytes Absolute 02/18/2021 1.3 (H)  0.1 - 1.0 K/uL Final   Eosinophils Relative 02/18/2021 0  % Final   Eosinophils Absolute 02/18/2021 0.0  0.0 - 0.5 K/uL Final   Basophils Relative 02/18/2021 0  % Final   Basophils Absolute 02/18/2021 0.1  0.0 - 0.1 K/uL Final   Immature Granulocytes 02/18/2021 0  % Final   Abs Immature Granulocytes 02/18/2021 0.04  0.00 - 0.07 K/uL Final   Performed at [facility], [address] [place], [place] [address]   Sodium 02/18/2021 136  135 - 145 mmol/L Final   Potassium 02/18/2021 3.8  3.5 - 5.1 mmol/L Final   Chloride 02/18/2021 99  98 - 111 mmol/L Final   CO2 02/18/2021 24  22 - 32 mmol/L Final   Glucose, Bld 02/18/2021 139 (H)  70 - 99 mg/dL Final   Glucose reference range applies only to samples taken after fasting for at least 8 hours.   BUN 02/18/2021 18  6 - 20 mg/dL Final   Creatinine, Ser 02/18/2021 1.26 (H)  0.61 - 1.24 mg/dL Final   Calcium 01/60/1093 10.3  8.9 - 10.3 mg/dL Final   Total Protein 23/55/7322 9.7 (H)  6.5 - 8.1 g/dL Final   Albumin 02/54/2706 5.2 (H)  3.5 - 5.0 g/dL Final   AST 23/76/2831 52 (H)  15 - 41 U/L Final   ALT 02/18/2021 24  0 - 44 U/L Final   Alkaline Phosphatase 02/18/2021 83  38 - 126 U/L Final   Total Bilirubin 02/18/2021 1.9 (H)  0.3 - 1.2 mg/dL Final   GFR, Estimated 02/18/2021 >60  >60 mL/min Final   Comment: (NOTE) Calculated using the CKD-EPI Creatinine Equation (2021)    Anion gap 02/18/2021 13  5 - 15 Final   Performed at  [facility], [address] [place], [place] [address]   Lipase 02/18/2021 24  11 - 51 U/L Final   Performed at [facility], [address] [place], [place] [address]   SARS Coronavirus 2 by RT PCR 02/18/2021 NEGATIVE  NEGATIVE Final   Comment: (NOTE) SARS-CoV-2 target nucleic acids are NOT DETECTED.  The SARS-CoV-2 RNA is generally detectable in upper respiratory specimens during the acute phase of infection. The lowest concentration of SARS-CoV-2 viral copies this assay can detect is 138 copies/mL. A negative result does not preclude SARS-Cov-2 infection and should not be used as the sole basis for treatment or other patient management decisions. A negative result may occur with  improper specimen collection/handling, submission of specimen other than nasopharyngeal swab, presence of viral mutation(s) within the areas targeted by this assay, and inadequate number of viral copies(<138 copies/mL). A negative result must be combined with clinical observations, patient history, and epidemiological information. The expected result is Negative.  Fact Sheet for Patients:  BloggerCourse.com  Fact Sheet for Healthcare Providers:  SeriousBroker.it  This test is no                          t yet approved or cleared by the [place] FDA and  has been authorized for detection and/or diagnosis of SARS-CoV-2 by FDA under an Emergency Use Authorization (EUA). This EUA will remain  in effect (meaning this test can be used) for the duration of the COVID-19 declaration under Section 564(b)(1) of the Act, 21 U.S.C.section 360bbb-3(b)(1), unless the authorization is terminated  or revoked  sooner.       Influenza A by PCR 02/18/2021 NEGATIVE  NEGATIVE Final   Influenza B by PCR 02/18/2021 NEGATIVE  NEGATIVE Final   Comment: (NOTE) The Xpert Xpress SARS-CoV-2/FLU/RSV plus assay is intended as an aid in the diagnosis of  influenza from Nasopharyngeal swab specimens and should not be used as a sole basis for treatment. Nasal washings and aspirates are unacceptable for Xpert Xpress SARS-CoV-2/FLU/RSV testing.  Fact Sheet for Patients: BloggerCourse.com  Fact Sheet for Healthcare Providers: SeriousBroker.it  This test is not yet approved or cleared by the [place] FDA and has been authorized for detection and/or diagnosis of SARS-CoV-2 by FDA under an Emergency Use Authorization (EUA). This EUA will remain in effect (meaning this test can be used) for the duration of the COVID-19 declaration under Section 564(b)(1) of the Act, 21 U.S.C. section 360bbb-3(b)(1), unless the authorization is terminated or revoked.  Performed at [facility], [address] [place], [place] [address]   Admission on 02/13/2021, Discharged on 02/14/2021  Component Date Value Ref Range Status   SARS Coronavirus 2 by RT PCR 02/13/2021 NEGATIVE  NEGATIVE Final   Comment: (NOTE) SARS-CoV-2 target nucleic acids are NOT DETECTED.  The SARS-CoV-2 RNA is generally detectable in upper respiratory specimens during the acute phase of infection. The lowest concentration of SARS-CoV-2 viral copies this assay can detect is 138 copies/mL. A negative result does not preclude SARS-Cov-2 infection and should not be used as the sole basis for treatment or other patient management decisions. A negative result may occur with  improper specimen collection/handling, submission of specimen other than nasopharyngeal swab, presence of viral mutation(s) within the areas targeted by this assay, and inadequate number of viral copies(<138 copies/mL). A negative result must be combined with clinical observations, patient history, and epidemiological information. The expected result is Negative.  Fact Sheet for Patients:  BloggerCourse.com  Fact Sheet for  Healthcare Providers:  SeriousBroker.it  This test is no                          t yet approved or cleared by the [place] FDA and  has been authorized for detection and/or diagnosis of SARS-CoV-2 by FDA under an Emergency Use Authorization (EUA). This EUA will remain  in effect (meaning this test can be used) for the duration of the COVID-19 declaration under Section 564(b)(1) of the Act, 21 U.S.C.section 360bbb-3(b)(1), unless the authorization is terminated  or revoked sooner.       Influenza A by PCR 02/13/2021 NEGATIVE  NEGATIVE Final   Influenza B by PCR 02/13/2021 NEGATIVE  NEGATIVE Final   Comment: (NOTE) The Xpert Xpress SARS-CoV-2/FLU/RSV plus assay is intended as an aid in the diagnosis of influenza from Nasopharyngeal swab specimens and should not be used as a sole basis for treatment. Nasal washings and aspirates are unacceptable for Xpert Xpress SARS-CoV-2/FLU/RSV testing.  Fact Sheet for Patients: BloggerCourse.com  Fact Sheet for Healthcare Providers: SeriousBroker.it  This test is not yet approved or cleared by the [place] FDA and has been authorized for detection and/or diagnosis of SARS-CoV-2 by FDA under an Emergency Use Authorization (EUA). This EUA will remain in effect (meaning this test can be used) for the duration of the COVID-19 declaration under Section 564(b)(1) of the Act, 21 U.S.C. section 360bbb-3(b)(1), unless the authorization is terminated or revoked.  Performed at [facility], [address], [place], [place] [address]    Alcohol,  Ethyl (B) 02/13/2021 <10  <10 mg/dL Final   Comment: (NOTE) Lowest detectable limit for serum alcohol is 10 mg/dL.  For medical purposes only. Performed at [facility], [address], [place], [place] [address]    POC Amphetamine UR 02/13/2021 Positive (A)  NONE DETECTED (Cut Off Level 1000 ng/mL) Final    POC Secobarbital (BAR) 02/13/2021 None Detected  NONE DETECTED (Cut Off Level 300 ng/mL) Final   POC Buprenorphine (BUP) 02/13/2021 None Detected  NONE DETECTED (Cut Off Level 10 ng/mL) Final   POC Oxazepam (BZO) 02/13/2021 None Detected  NONE DETECTED (Cut Off Level 300 ng/mL) Final   POC Cocaine UR 02/13/2021 None Detected  NONE DETECTED (Cut Off Level 300 ng/mL) Final   POC Methamphetamine UR 02/13/2021 Positive (A)  NONE DETECTED (Cut Off Level 1000 ng/mL) Final   POC Morphine 02/13/2021 None Detected  NONE DETECTED (Cut Off Level 300 ng/mL) Final   POC Oxycodone UR 02/13/2021 None Detected  NONE DETECTED (Cut Off Level 100 ng/mL) Final   POC Methadone UR 02/13/2021 None Detected  NONE DETECTED (Cut Off Level 300 ng/mL) Final   POC Marijuana UR 02/13/2021 None Detected  NONE DETECTED (Cut Off Level 50 ng/mL) Final   SARSCOV2ONAVIRUS 2 AG 02/13/2021 NEGATIVE  NEGATIVE Final   Comment: (NOTE) SARS-CoV-2 antigen NOT DETECTED.   Negative results are presumptive.  Negative results do not preclude SARS-CoV-2 infection and should not be used as the sole basis for treatment or other patient management decisions, including infection  control decisions, particularly in the presence of clinical signs and  symptoms consistent with COVID-19, or in those who have been in contact with the virus.  Negative results must be combined with clinical observations, patient history, and epidemiological information. The expected result is Negative.  Fact Sheet for Patients: https://www.jennings-kim.com/  Fact Sheet for Healthcare Providers: https://alexander-rogers.biz/  This test is not yet approved or cleared by the [place] FDA and  has been authorized for detection and/or diagnosis of SARS-CoV-2 by FDA under an Emergency Use Authorization (EUA).  This EUA will remain in effect (meaning this test can be used) for the duration of  the COV                          ID-19  declaration under Section 564(b)(1) of the Act, 21 U.S.C. section 360bbb-3(b)(1), unless the authorization is terminated or revoked sooner.    Admission on 02/11/2021, Discharged on 02/12/2021  Component Date Value Ref Range Status   Sodium 02/11/2021 140  135 - 145 mmol/L Final   Potassium 02/11/2021 4.1  3.5 - 5.1 mmol/L Final   Chloride 02/11/2021 103  98 - 111 mmol/L Final   CO2 02/11/2021 24  22 - 32 mmol/L Final   Glucose, Bld 02/11/2021 89  70 - 99 mg/dL Final   Glucose reference range applies only to samples taken after fasting for at least 8 hours.   BUN 02/11/2021 12  6 - 20 mg/dL Final   Creatinine, Ser 02/11/2021 0.89  0.61 - 1.24 mg/dL Final   Calcium 56/21/3086 10.2  8.9 - 10.3 mg/dL Final   Total Protein 57/84/6962 8.4 (H)  6.5 - 8.1 g/dL Final   Albumin 95/28/4132 4.6  3.5 - 5.0 g/dL Final   AST 44/04/270 40  15 - 41 U/L Final   ALT 02/11/2021 19  0 - 44 U/L Final   Alkaline Phosphatase 02/11/2021 83  38 - 126 U/L Final  Total Bilirubin 02/11/2021 1.7 (H)  0.3 - 1.2 mg/dL Final   GFR, Estimated 02/11/2021 >60  >60 mL/min Final   Comment: (NOTE) Calculated using the CKD-EPI Creatinine Equation (2021)    Anion gap 02/11/2021 13  5 - 15 Final   Performed at [facility], [address], [place], [place] [address]   Alcohol, Ethyl (B) 02/11/2021 <10  <10 mg/dL Final   Comment: (NOTE) Lowest detectable limit for serum alcohol is 10 mg/dL.  For medical purposes only. Performed at [facility], [address] [place], [place] [address]    Salicylate Lvl 02/11/2021 <7.0 (L)  7.0 - 30.0 mg/dL Final   Performed at [facility], [address] [place], [place] [address]   Acetaminophen (Tylenol), Serum 02/11/2021 <10 (L)  10 - 30 ug/mL Final   Comment: (NOTE) Therapeutic concentrations vary significantly. A range of 10-30 ug/mL  may be an effective concentration for many patients. However, some  are best treated at concentrations outside of this  range. Acetaminophen concentrations >150 ug/mL at 4 hours after ingestion  and >50 ug/mL at 12 hours after ingestion are often associated with  toxic reactions.  Performed at [facility], [address] [place], [place] [address]    WBC 02/11/2021 11.6 (H)  4.0 - 10.5 K/uL Final   RBC 02/11/2021 5.47  4.22 - 5.81 MIL/uL Final   Hemoglobin 02/11/2021 15.5  13.0 - 17.0 g/dL Final   HCT 69/62/9528 46.5  39.0 - 52.0 % Final   MCV 02/11/2021 85.0  80.0 - 100.0 fL Final   MCH 02/11/2021 28.3  26.0 - 34.0 pg Final   MCHC 02/11/2021 33.3  30.0 - 36.0 g/dL Final   RDW 41/32/4401 13.8  11.5 - 15.5 % Final   Platelets 02/11/2021 337  150 - 400 K/uL Final   nRBC 02/11/2021 0.0  0.0 - 0.2 % Final   Performed at [facility], [address] [place], [place] [address]   Opiates 02/11/2021 NONE DETECTED  NONE DETECTED Final   Cocaine 02/11/2021 NONE DETECTED  NONE DETECTED Final   Benzodiazepines 02/11/2021 NONE DETECTED  NONE DETECTED Final   Amphetamines 02/11/2021 POSITIVE (A)  NONE DETECTED Final   Tetrahydrocannabinol 02/11/2021 NONE DETECTED  NONE DETECTED Final   Barbiturates 02/11/2021 NONE DETECTED  NONE DETECTED Final   Comment: (NOTE) DRUG SCREEN FOR MEDICAL PURPOSES ONLY.  IF CONFIRMATION IS NEEDED FOR ANY PURPOSE, NOTIFY LAB WITHIN 5 DAYS.  LOWEST DETECTABLE LIMITS FOR URINE DRUG SCREEN Drug Class                     Cutoff (ng/mL) Amphetamine and metabolites    1000 Barbiturate and metabolites    200 Benzodiazepine                 200 Tricyclics and metabolites     300 Opiates and metabolites        300 Cocaine and metabolites        300 THC                            50 Performed at [facility], [address], [place], [place] [address]    SARS Coronavirus 2 by RT PCR 02/11/2021 NEGATIVE  NEGATIVE Final   Comment: (NOTE) SARS-CoV-2 target nucleic acids are NOT DETECTED.  The SARS-CoV-2 RNA is generally detectable in upper respiratory specimens  during the acute phase of infection. The lowest concentration of SARS-CoV-2 viral copies this assay can detect is 138 copies/mL. A negative result does not preclude SARS-Cov-2 infection and should not be used as the sole basis for treatment or other patient management decisions. A negative result may occur with  improper specimen collection/handling, submission of specimen other than nasopharyngeal swab, presence of viral mutation(s) within the areas targeted by this assay, and inadequate number of viral copies(<138 copies/mL). A negative result must be combined with clinical observations, patient history, and epidemiological information. The expected result is Negative.  Fact Sheet for Patients:  BloggerCourse.com  Fact Sheet for Healthcare Providers:  SeriousBroker.it  This test is no                          t yet approved or cleared by the [place] FDA and  has been authorized for detection and/or diagnosis of SARS-CoV-2 by FDA under an Emergency Use Authorization (EUA). This EUA will remain  in effect (meaning this test can be used) for the duration of the COVID-19 declaration under Section 564(b)(1) of the Act, 21 U.S.C.section 360bbb-3(b)(1), unless the authorization is terminated  or revoked sooner.       Influenza A by PCR 02/11/2021 NEGATIVE  NEGATIVE Final   Influenza B by PCR 02/11/2021 NEGATIVE  NEGATIVE Final   Comment: (NOTE) The Xpert Xpress SARS-CoV-2/FLU/RSV plus assay is intended as an aid in the diagnosis of influenza from Nasopharyngeal swab specimens and should not be used as a sole basis for treatment. Nasal washings and aspirates are unacceptable for Xpert Xpress SARS-CoV-2/FLU/RSV testing.  Fact Sheet for Patients: BloggerCourse.com  Fact Sheet for Healthcare Providers: SeriousBroker.it  This test is not yet approved or cleared by the [place] FDA and has been authorized for detection and/or diagnosis of SARS-CoV-2 by FDA under an Emergency Use Authorization (EUA). This EUA will remain in effect (meaning this test can be used) for the duration of the COVID-19 declaration under Section 564(b)(1) of the Act, 21 U.S.C. section 360bbb-3(b)(1), unless the authorization is terminated or revoked.  Performed at [facility], [address], [place], [place] [address]   Admission on 02/05/2021, Discharged on 02/05/2021  Component Date Value Ref Range Status   SARS Coronavirus 2 by RT PCR 02/05/2021 NEGATIVE  NEGATIVE Final   Comment: (NOTE) SARS-CoV-2 target nucleic acids are NOT DETECTED.  The SARS-CoV-2 RNA is generally detectable in upper respiratory specimens during the acute phase of infection. The lowest concentration of SARS-CoV-2 viral copies this assay can detect is 138 copies/mL. A negative result does not preclude SARS-Cov-2 infection and should not be used as the sole basis for treatment or other patient management decisions. A negative result may occur with  improper specimen collection/handling, submission of specimen other than nasopharyngeal swab, presence of viral mutation(s) within the areas targeted by this assay, and inadequate number of viral copies(<138 copies/mL). A negative result must be combined with clinical observations, patient history, and epidemiological information. The expected result is Negative.  Fact Sheet for Patients:  BloggerCourse.com  Fact Sheet for Healthcare Providers:  SeriousBroker.it  This test is no                          t yet approved or cleared by the [place] FDA and  has been authorized for detection and/or diagnosis of SARS-CoV-2 by FDA under an Emergency Use Authorization (EUA).  This EUA will remain  in effect (meaning this test can be used) for the duration of the COVID-19 declaration under Section  564(b)(1) of the Act, 21 U.S.C.section 360bbb-3(b)(1), unless the authorization is terminated  or revoked sooner.       Influenza A by PCR 02/05/2021 NEGATIVE  NEGATIVE Final   Influenza B by PCR 02/05/2021 NEGATIVE  NEGATIVE Final   Comment: (NOTE) The Xpert Xpress SARS-CoV-2/FLU/RSV plus assay is intended as an aid in the diagnosis of influenza from Nasopharyngeal swab specimens and should not be used as a sole basis for treatment. Nasal washings and aspirates are unacceptable for Xpert Xpress SARS-CoV-2/FLU/RSV testing.  Fact Sheet for Patients: BloggerCourse.com  Fact Sheet for Healthcare Providers: SeriousBroker.it  This test is not yet approved or cleared by the [place] FDA and has been authorized for detection and/or diagnosis of SARS-CoV-2 by FDA under an Emergency Use Authorization (EUA). This EUA will remain in effect (meaning this test can be used) for the duration of the COVID-19 declaration under Section 564(b)(1) of the Act, 21 U.S.C. section 360bbb-3(b)(1), unless the authorization is terminated or revoked.  Performed at [facility], [address], [place], [place] [address]    SARS Coronavirus 2 Ag 02/05/2021 Negative  Negative Preliminary   WBC 02/05/2021 9.2  4.0 - 10.5 K/uL Final   RBC 02/05/2021 5.05  4.22 - 5.81 MIL/uL Final   Hemoglobin 02/05/2021 14.4  13.0 - 17.0 g/dL Final   HCT 19/14/7829 43.1  39.0 - 52.0 % Final   MCV 02/05/2021 85.3  80.0 - 100.0 fL Final   MCH 02/05/2021 28.5  26.0 - 34.0 pg Final   MCHC 02/05/2021 33.4  30.0 - 36.0 g/dL Final   RDW 56/21/3086 14.0  11.5 - 15.5 % Final   Platelets 02/05/2021 346  150 - 400 K/uL Final   nRBC 02/05/2021 0.0  0.0 - 0.2 % Final   Neutrophils Relative % 02/05/2021 59  % Final   Neutro Abs 02/05/2021 5.3  1.7 - 7.7 K/uL Final   Lymphocytes Relative 02/05/2021 29  % Final   Lymphs Abs 02/05/2021 2.7  0.7 - 4.0 K/uL Final   Monocytes  Relative 02/05/2021 7  % Final   Monocytes Absolute 02/05/2021 0.7  0.1 - 1.0 K/uL Final   Eosinophils Relative 02/05/2021 5  % Final   Eosinophils Absolute 02/05/2021 0.5  0.0 - 0.5 K/uL Final   Basophils Relative 02/05/2021 0  % Final   Basophils Absolute 02/05/2021 0.0  0.0 - 0.1 K/uL Final   Immature Granulocytes 02/05/2021 0  % Final   Abs Immature Granulocytes 02/05/2021 0.03  0.00 - 0.07 K/uL Final   Performed at [facility], [address] [place], [place] [address]   Sodium 02/05/2021 138  135 - 145 mmol/L Final   Potassium 02/05/2021 4.0  3.5 - 5.1 mmol/L Final   Chloride 02/05/2021 102  98 - 111 mmol/L Final   CO2 02/05/2021 25  22 - 32 mmol/L Final   Glucose, Bld 02/05/2021 88  70 - 99 mg/dL Final   Glucose reference range applies only to samples taken after fasting for at least 8 hours.   BUN 02/05/2021 15  6 - 20 mg/dL Final   Creatinine, Ser 02/05/2021 0.76  0.61 - 1.24 mg/dL Final   Calcium 96/29/5284 9.8  8.9 - 10.3 mg/dL Final   Total Protein 13/24/4010 7.5  6.5 - 8.1 g/dL Final   Albumin 27/25/3664 4.3  3.5 - 5.0  g/dL Final   AST 40/98/1191 21  15 - 41 U/L Final   ALT 02/05/2021 16  0 - 44 U/L Final   Alkaline Phosphatase 02/05/2021 76  38 - 126 U/L Final   Total Bilirubin 02/05/2021 1.0  0.3 - 1.2 mg/dL Final   GFR, Estimated 02/05/2021 >60  >60 mL/min Final   Comment: (NOTE) Calculated using the CKD-EPI Creatinine Equation (2021)    Anion gap 02/05/2021 11  5 - 15 Final   Performed at [facility], [address] [place], [place] [address]   Alcohol, Ethyl (B) 02/05/2021 <10  <10 mg/dL Final   Comment: (NOTE) Lowest detectable limit for serum alcohol is 10 mg/dL.  For medical purposes only. Performed at [facility], [address] [place], [place] [address]    POC Amphetamine UR 02/05/2021 None Detected  NONE DETECTED (Cut Off Level 1000 ng/mL) Preliminary   POC Secobarbital (BAR) 02/05/2021 None Detected  NONE DETECTED (Cut Off Level  300 ng/mL) Preliminary   POC Buprenorphine (BUP) 02/05/2021 None Detected  NONE DETECTED (Cut Off Level 10 ng/mL) Preliminary   POC Oxazepam (BZO) 02/05/2021 None Detected  NONE DETECTED (Cut Off Level 300 ng/mL) Preliminary   POC Cocaine UR 02/05/2021 None Detected  NONE DETECTED (Cut Off Level 300 ng/mL) Preliminary   POC Methamphetamine UR 02/05/2021 None Detected  NONE DETECTED (Cut Off Level 1000 ng/mL) Preliminary   POC Morphine 02/05/2021 None Detected  NONE DETECTED (Cut Off Level 300 ng/mL) Preliminary   POC Oxycodone UR 02/05/2021 None Detected  NONE DETECTED (Cut Off Level 100 ng/mL) Preliminary   POC Methadone UR 02/05/2021 None Detected  NONE DETECTED (Cut Off Level 300 ng/mL) Preliminary   POC Marijuana UR 02/05/2021 None Detected  NONE DETECTED (Cut Off Level 50 ng/mL) Preliminary   TSH 02/05/2021 5.750 (H)  0.350 - 4.500 uIU/mL Final   Comment: Performed by a 3rd Generation assay with a functional sensitivity of <=0.01 uIU/mL. Performed at [facility], [address] [place], [place] [address]    SARSCOV2ONAVIRUS 2 AG 02/05/2021 NEGATIVE  NEGATIVE Final   Comment: (NOTE) SARS-CoV-2 antigen NOT DETECTED.   Negative results are presumptive.  Negative results do not preclude SARS-CoV-2 infection and should not be used as the sole basis for treatment or other patient management decisions, including infection  control decisions, particularly in the presence of clinical signs and  symptoms consistent with COVID-19, or in those who have been in contact with the virus.  Negative results must be combined with clinical observations, patient history, and epidemiological information. The expected result is Negative.  Fact Sheet for Patients: https://www.jennings-kim.com/  Fact Sheet for Healthcare Providers: https://alexander-rogers.biz/  This test is not yet approved or cleared by the [place] FDA and  has been authorized for detection and/or  diagnosis of SARS-CoV-2 by FDA under an Emergency Use Authorization (EUA).  This EUA will remain in effect (meaning this test can be used) for the duration of  the COV                          ID-19 declaration under Section 564(b)(1) of the Act, 21 U.S.C. section 360bbb-3(b)(1), unless the authorization is terminated or revoked sooner.     Free T4 02/05/2021 0.80  0.61 - 1.12 ng/dL Final   Comment: (NOTE) Biotin ingestion may interfere with free T4 tests. If the results are inconsistent with the TSH level, previous test results, or the clinical  presentation, then consider biotin interference. If needed, order repeat testing after stopping biotin. Performed at [facility], [address], [place], [place] [address]    T3, Free 02/05/2021 3.8  2.0 - 4.4 pg/mL Final   Comment: (NOTE) Performed At: [facility] [address] [place], [place] [id number] Jolene Schimke MD AO:[id number]   Admission on 02/04/2021, Discharged on 02/04/2021  Component Date Value Ref Range Status   SARS Coronavirus 2 by RT PCR 02/04/2021 NEGATIVE  NEGATIVE Final   Comment: (NOTE) SARS-CoV-2 target nucleic acids are NOT DETECTED.  The SARS-CoV-2 RNA is generally detectable in upper respiratory specimens during the acute phase of infection. The lowest concentration of SARS-CoV-2 viral copies this assay can detect is 138 copies/mL. A negative result does not preclude SARS-Cov-2 infection and should not be used as the sole basis for treatment or other patient management decisions. A negative result may occur with  improper specimen collection/handling, submission of specimen other than nasopharyngeal swab, presence of viral mutation(s) within the areas targeted by this assay, and inadequate number of viral copies(<138 copies/mL). A negative result must be combined with clinical observations, patient history, and epidemiological information. The expected result is Negative.  Fact Sheet for  Patients:  BloggerCourse.com  Fact Sheet for Healthcare Providers:  SeriousBroker.it  This test is no                          t yet approved or cleared by the [place] FDA and  has been authorized for detection and/or diagnosis of SARS-CoV-2 by FDA under an Emergency Use Authorization (EUA). This EUA will remain  in effect (meaning this test can be used) for the duration of the COVID-19 declaration under Section 564(b)(1) of the Act, 21 U.S.C.section 360bbb-3(b)(1), unless the authorization is terminated  or revoked sooner.       Influenza A by PCR 02/04/2021 NEGATIVE  NEGATIVE Final   Influenza B by PCR 02/04/2021 NEGATIVE  NEGATIVE Final   Comment: (NOTE) The Xpert Xpress SARS-CoV-2/FLU/RSV plus assay is intended as an aid in the diagnosis of influenza from Nasopharyngeal swab specimens and should not be used as a sole basis for treatment. Nasal washings and aspirates are unacceptable for Xpert Xpress SARS-CoV-2/FLU/RSV testing.  Fact Sheet for Patients: BloggerCourse.com  Fact Sheet for Healthcare Providers: SeriousBroker.it  This test is not yet approved or cleared by the [place] FDA and has been authorized for detection and/or diagnosis of SARS-CoV-2 by FDA under an Emergency Use Authorization (EUA). This EUA will remain in effect (meaning this test can be used) for the duration of the COVID-19 declaration under Section 564(b)(1) of the Act, 21 U.S.C. section 360bbb-3(b)(1), unless the authorization is terminated or revoked.  Performed at [facility], [address], [place], [place] [address]   Admission on 01/16/2021, Discharged on 01/24/2021  Component Date Value Ref Range Status   Cholesterol 01/17/2021 94  0 - 200 mg/dL Final   Triglycerides 28/41/3244 100  <150 mg/dL Final   HDL 04/29/7251 28 (L)  >40 mg/dL Final   Total CHOL/HDL Ratio  01/17/2021 3.4  RATIO Final   VLDL 01/17/2021 20  0 - 40 mg/dL Final   LDL Cholesterol 01/17/2021 46  0 - 99 mg/dL Final   Comment:        Total Cholesterol/HDL:CHD Risk Coronary Heart Disease Risk Table  Men   Women  1/2 Average Risk   3.4   3.3  Average Risk       5.0   4.4  2 X Average Risk   9.6   7.1  3 X Average Risk  23.4   11.0        Use the calculated Patient Ratio above and the CHD Risk Table to determine the patient's CHD Risk.        ATP III CLASSIFICATION (LDL):  <100     mg/dL   Optimal  244-010  mg/dL   Near or Above                    Optimal  130-159  mg/dL   Borderline  272-536  mg/dL   High  >644     mg/dL   Very High Performed at [facility], [address] [place], [place] [address]    Hgb A1c MFr Bld 01/17/2021 6.0 (H)  4.8 - 5.6 % Final   Comment: (NOTE)         Prediabetes: 5.7 - 6.4         Diabetes: >6.4         Glycemic control for adults with diabetes: <7.0    Mean Plasma Glucose 01/17/2021 126  mg/dL Final   Comment: (NOTE) Performed At: [facility] [address] [place], [place] [id number] Jolene Schimke MD [id number]   Admission on 01/16/2021, Discharged on 01/16/2021  Component Date Value Ref Range Status   Sodium 01/16/2021 134 (L)  135 - 145 mmol/L Final   Potassium 01/16/2021 3.8  3.5 - 5.1 mmol/L Final   Chloride 01/16/2021 96 (L)  98 - 111 mmol/L Final   CO2 01/16/2021 27  22 - 32 mmol/L Final   Glucose, Bld 01/16/2021 79  70 - 99 mg/dL Final   Glucose reference range applies only to samples taken after fasting for at least 8 hours.   BUN 01/16/2021 8  6 - 20 mg/dL Final   Creatinine, Ser 01/16/2021 0.83  0.61 - 1.24 mg/dL Final   Calcium 66/09/3014 9.3  8.9 - 10.3 mg/dL Final   Total Protein 05/05/3233 7.6  6.5 - 8.1 g/dL Final   Albumin 57/32/2025 4.0  3.5 - 5.0 g/dL Final   AST 42/70/6237 22  15 - 41 U/L Final   ALT 01/16/2021 18  0 - 44 U/L Final   Alkaline Phosphatase 01/16/2021  58  38 - 126 U/L Final   Total Bilirubin 01/16/2021 0.8  0.3 - 1.2 mg/dL Final   GFR, Estimated 01/16/2021 >60  >60 mL/min Final   Comment: (NOTE) Calculated using the CKD-EPI Creatinine Equation (2021)    Anion gap 01/16/2021 11  5 - 15 Final   Performed at [facility], [address] [place], [place] [address]   Alcohol, Ethyl (B) 01/16/2021 <10  <10 mg/dL Final   Comment: (NOTE) Lowest detectable limit for serum alcohol is 10 mg/dL.  For medical purposes only. Performed at [facility], [address] [place], [place] [address]    Salicylate Lvl 01/16/2021 <7.0 (L)  7.0 - 30.0 mg/dL Final   Performed at [facility], [address] [place], [place] [address]   Acetaminophen (Tylenol), Serum 01/16/2021 <10 (L)  10 - 30 ug/mL Final   Comment: (NOTE) Therapeutic concentrations vary significantly. A range of 10-30 ug/mL  may be an effective concentration for many patients. However, some  are best treated at concentrations  outside of this range. Acetaminophen concentrations >150 ug/mL at 4 hours after ingestion  and >50 ug/mL at 12 hours after ingestion are often associated with  toxic reactions.  Performed at [facility], [address] [place], [place] [address]    WBC 01/16/2021 8.8  4.0 - 10.5 K/uL Final   RBC 01/16/2021 4.89  4.22 - 5.81 MIL/uL Final   Hemoglobin 01/16/2021 14.4  13.0 - 17.0 g/dL Final   HCT 60/45/4098 41.7  39.0 - 52.0 % Final   MCV 01/16/2021 85.3  80.0 - 100.0 fL Final   MCH 01/16/2021 29.4  26.0 - 34.0 pg Final   MCHC 01/16/2021 34.5  30.0 - 36.0 g/dL Final   RDW 11/91/4782 13.5  11.5 - 15.5 % Final   Platelets 01/16/2021 402 (H)  150 - 400 K/uL Final   nRBC 01/16/2021 0.0  0.0 - 0.2 % Final   Performed at [facility], [address] [place], [place] [address]   Tricyclic, Ur Screen 01/16/2021 NONE DETECTED  NONE DETECTED Final   Amphetamines, Ur Screen 01/16/2021 POSITIVE (A)  NONE DETECTED  Final   MDMA (Ecstasy)Ur Screen 01/16/2021 NONE DETECTED  NONE DETECTED Final   Cocaine Metabolite,Ur [place] 01/16/2021 NONE DETECTED  NONE DETECTED Final   Opiate, Ur Screen 01/16/2021 NONE DETECTED  NONE DETECTED Final   Phencyclidine (PCP) Ur S 01/16/2021 NONE DETECTED  NONE DETECTED Final   Cannabinoid 50 Ng, Ur [place] 01/16/2021 NONE DETECTED  NONE DETECTED Final   Barbiturates, Ur Screen 01/16/2021 NONE DETECTED  NONE DETECTED Final   Benzodiazepine, Ur Scrn 01/16/2021 NONE DETECTED  NONE DETECTED Final   Methadone Scn, Ur 01/16/2021 NONE DETECTED  NONE DETECTED Final   Comment: (NOTE) Tricyclics + metabolites, urine    Cutoff 1000 ng/mL Amphetamines + metabolites, urine  Cutoff 1000 ng/mL MDMA (Ecstasy), urine              Cutoff 500 ng/mL Cocaine Metabolite, urine          Cutoff 300 ng/mL Opiate + metabolites, urine        Cutoff 300 ng/mL Phencyclidine (PCP), urine         Cutoff 25 ng/mL Cannabinoid, urine                 Cutoff 50 ng/mL Barbiturates + metabolites, urine  Cutoff 200 ng/mL Benzodiazepine, urine              Cutoff 200 ng/mL Methadone, urine                   Cutoff 300 ng/mL  The urine drug screen provides only a preliminary, unconfirmed analytical test result and should not be used for non-medical purposes. Clinical consideration and professional judgment should be applied to any positive drug screen result due to possible interfering substances. A more specific alternate chemical method must be used in order to obtain a confirmed analytical result. Gas chromatography / mass spectrometry (GC/MS) is the preferred confirm                          atory method. Performed at [facility], [address] [place], [place] [address]    SARS Coronavirus 2 by RT PCR 01/16/2021 NEGATIVE  NEGATIVE Final   Comment: (NOTE) SARS-CoV-2 target nucleic acids are NOT DETECTED.  The SARS-CoV-2 RNA is generally detectable in upper respiratory specimens during the  acute phase of infection. The lowest concentration of SARS-CoV-2  viral copies this assay can detect is 138 copies/mL. A negative result does not preclude SARS-Cov-2 infection and should not be used as the sole basis for treatment or other patient management decisions. A negative result may occur with  improper specimen collection/handling, submission of specimen other than nasopharyngeal swab, presence of viral mutation(s) within the areas targeted by this assay, and inadequate number of viral copies(<138 copies/mL). A negative result must be combined with clinical observations, patient history, and epidemiological information. The expected result is Negative.  Fact Sheet for Patients:  BloggerCourse.com  Fact Sheet for Healthcare Providers:  SeriousBroker.it  This test is no                          t yet approved or cleared by the [place] FDA and  has been authorized for detection and/or diagnosis of SARS-CoV-2 by FDA under an Emergency Use Authorization (EUA). This EUA will remain  in effect (meaning this test can be used) for the duration of the COVID-19 declaration under Section 564(b)(1) of the Act, 21 U.S.C.section 360bbb-3(b)(1), unless the authorization is terminated  or revoked sooner.       Influenza A by PCR 01/16/2021 NEGATIVE  NEGATIVE Final   Influenza B by PCR 01/16/2021 NEGATIVE  NEGATIVE Final   Comment: (NOTE) The Xpert Xpress SARS-CoV-2/FLU/RSV plus assay is intended as an aid in the diagnosis of influenza from Nasopharyngeal swab specimens and should not be used as a sole basis for treatment. Nasal washings and aspirates are unacceptable for Xpert Xpress SARS-CoV-2/FLU/RSV testing.  Fact Sheet for Patients: BloggerCourse.com  Fact Sheet for Healthcare Providers: SeriousBroker.it  This test is not yet approved or cleared by the [place] FDA  and has been authorized for detection and/or diagnosis of SARS-CoV-2 by FDA under an Emergency Use Authorization (EUA). This EUA will remain in effect (meaning this test can be used) for the duration of the COVID-19 declaration under Section 564(b)(1) of the Act, 21 U.S.C. section 360bbb-3(b)(1), unless the authorization is terminated or revoked.  Performed at [facility], [address] [place], [place] [address]   There may be more visits with results that are not included.    Blood Alcohol level:  Lab Results  Component Value Date   ETH <10 04/14/2021   ETH <10 02/22/2021    Metabolic Disorder Labs: Lab Results  Component Value Date   HGBA1C 6.0 (H) 01/17/2021   MPG 126 01/17/2021   MPG 126 10/08/2020   No results found for: PROLACTIN Lab Results  Component Value Date   CHOL 94 01/17/2021   TRIG 100 01/17/2021   HDL 28 (L) 01/17/2021   CHOLHDL 3.4 01/17/2021   VLDL 20 01/17/2021   LDLCALC 46 01/17/2021   LDLCALC 49 10/08/2020    Therapeutic Lab Levels: No results found for: LITHIUM Lab Results  Component Value Date   VALPROATE 62 10/16/2019   No components found for:  CBMZ  Physical Findings   AIMS    Flowsheet Row Admission (Discharged) from 01/16/2021 in [facility]) [facility]) [facility]  AIMS Total Score 0 0 0      AUDIT    Flowsheet Row Admission (Discharged) from 01/16/2021 in [facility] INPATIENT BEHAVIORAL MEDICINE Admission (Discharged) [facility] (Discharged[facility]  Admission (Discharged) from 10/10/2019 in [facility] INPATIENT BE[facility] MEDICINE Admission (Discharged) from 09/14/2019 in [facility] INPATIENT BEHAVIORAL MEDICINE  Alcohol Use Disorder Identification  Test Final Score (AUDIT) 1 34 15 0 0      PHQ2-9    Flowsheet Row ED from 04/14/2021 in [facility] EMERGENCY DEPARTMENT ED from 02/05/2021 in [facility] ED from 01/14/2021 in [place] EMERGENCY DEPARTMENT ED from 11/10/2020 in [facility] EMERGENCY DEPARTMENT ED from 10/28/2020 in [place] EMERGENCY DEPARTMENT  PHQ-2 Total Score 2 4 2 4 3   PHQ-9 Total Score 16 9 6 8 10       Flowsheet Row ED from 04/15/2021 in [facility] ED from 04/14/2021 in [place] EMERGENCY DEPARTMENT ED from 04/05/2021 in [facility]  C-SSRS RISK CATEGORY Low Risk High Risk No Risk        Musculoskeletal  Strength & Muscle Tone: within normal limits Gait & Station: normal Patient leans: N/A  Psychiatric Specialty Exam  Presentation  General Appearance: Appropriate for Environment; Casual  Eye Contact:Good  Speech:Clear and Coherent; Normal Rate  Speech Volume:Normal  Handedness:Right   Mood and Affect  Mood:Depressed  Affect:Congruent   Thought Process  Thought Processes:Coherent  Descriptions of Associations:Intact  Orientation:Full (Time, Place and Person)  Thought Content:Logical  Diagnosis of Schizophrenia or Schizoaffective disorder in past: No    Hallucinations:Hallucinations: None  Ideas of Reference:None  Suicidal Thoughts:Suicidal Thoughts: No  Homicidal Thoughts:Homicidal Thoughts: No   Sensorium  Memory:Immediate Good; Remote Good; Recent Good  Judgment:Poor  Insight:Fair   Executive Functions  Concentration:Good  Attention Span:Good  Recall:Good  Fund of Knowledge:Good  Language:Good   Psychomotor Activity  Psychomotor Activity:Psychomotor Activity: Normal   Assets  Assets:Communication Skills; Desire for Improvement   Sleep  Sleep:Sleep: Fair   No data recorded  Physical Exam  Physical Exam Vitals and nursing note reviewed.  Pulmonary:     Effort:  Pulmonary effort is normal.  Neurological:     Mental Status: He is oriented to person, place, and time.  Psychiatric:        Mood and Affect: Mood normal.        Behavior: Behavior normal.   Review of Systems  Cardiovascular: Negative.   Gastrointestinal: Negative.   Psychiatric/Behavioral:  Negative for depression and suicidal ideas. The patient is nervous/anxious.   All other systems reviewed and are negative. Blood pressure (!) 101/54, pulse 61, temperature 97.9 F (36.6 C), temperature source Oral, resp. rate 16, SpO2 99 %. There is no height or weight on file to calculate BMI.  Treatment Plan Summary: Daily contact with patient to assess and evaluate symptoms and progress in treatment and Medication management  Continue with current treatment plan on 04/21/2021 as listed below except were noted  Posttraumatic stress disorder: Major depressive disorder: Substance-induced mood disorder:  Continue Zoloft 50 mg p.o. daily Increase Seroquel 25 mg p.o. to 50 mg p.o. nightly Continue hydroxyzine 25 mg p.o. 3 times daily as needed  Sexually transmitted infection: Labs: urine pending-Chlamydia testing   CSW to continue working on discharge disposition anticipated discharge 04/22/2021 [place] A Almando Brawley 04/21/2021 1:38 PM

## 2021-04-21 NOTE — Progress Notes (Signed)
Patient resting in bed.  Refused nicotine patch. Asked if it was breakfast time.  Encouraged to come down to dining room.  Continue to monitor for safety.

## 2021-04-21 NOTE — ED Notes (Signed)
Meal provided 

## 2021-04-21 NOTE — ED Notes (Signed)
Pt sitting in the dining room interacting with other patients. No distress observed. Safety maintained and will continue to monitor.

## 2021-04-21 NOTE — ED Notes (Signed)
Patient resting quietly with eyes closed, no s/s/ of distress respirations even and unlabored.

## 2021-04-21 NOTE — ED Notes (Signed)
Pt came to nurse and requested for his inhaler.  °

## 2021-04-21 NOTE — ED Notes (Signed)
Pt eating lunch

## 2021-04-21 NOTE — ED Notes (Signed)
Pt eating breakfast 

## 2021-04-22 DIAGNOSIS — R45851 Suicidal ideations: Secondary | ICD-10-CM | POA: Diagnosis not present

## 2021-04-22 DIAGNOSIS — F17210 Nicotine dependence, cigarettes, uncomplicated: Secondary | ICD-10-CM | POA: Diagnosis not present

## 2021-04-22 DIAGNOSIS — F332 Major depressive disorder, recurrent severe without psychotic features: Secondary | ICD-10-CM | POA: Diagnosis not present

## 2021-04-22 DIAGNOSIS — F1994 Other psychoactive substance use, unspecified with psychoactive substance-induced mood disorder: Secondary | ICD-10-CM | POA: Diagnosis not present

## 2021-04-22 MED ORDER — HYDROXYZINE PAMOATE 25 MG PO CAPS: 25.0000 mg | ORAL_CAPSULE | Freq: Three times a day (TID) | ORAL | 1 refills | Status: DC

## 2021-04-22 MED ORDER — NICOTINE 14 MG/24HR TD PT24: 14.0000 mg | MEDICATED_PATCH | TRANSDERMAL | 1 refills | Status: DC

## 2021-04-22 MED ORDER — SERTRALINE HCL 50 MG PO TABS: 50.0000 mg | ORAL_TABLET | Freq: Every day | ORAL | 1 refills | Status: DC

## 2021-04-22 MED ORDER — ALBUTEROL SULFATE HFA 108 (90 BASE) MCG/ACT IN AERS: 2.0000 | INHALATION_SPRAY | RESPIRATORY_TRACT | 1 refills | Status: DC | PRN

## 2021-04-22 MED ORDER — NICOTINE 14 MG/24HR TD PT24: 14.0000 mg | MEDICATED_PATCH | Freq: Every day | TRANSDERMAL | 0 refills | Status: DC

## 2021-04-22 MED ORDER — ALBUTEROL SULFATE HFA 108 (90 BASE) MCG/ACT IN AERS: 2.0000 | INHALATION_SPRAY | RESPIRATORY_TRACT | 0 refills | Status: DC | PRN

## 2021-04-22 MED ORDER — CEPACOL REGULAR STRENGTH 3 MG MT LOZG: 1.0000 | LOZENGE | OROMUCOSAL | 1 refills | Status: DC | PRN

## 2021-04-22 MED ORDER — QUETIAPINE FUMARATE 50 MG PO TABS: 50.0000 mg | ORAL_TABLET | Freq: Every day | ORAL | 0 refills | Status: DC

## 2021-04-22 MED ORDER — QUETIAPINE FUMARATE 50 MG PO TABS: 50.0000 mg | ORAL_TABLET | Freq: Every day | ORAL | 1 refills | Status: DC

## 2021-04-22 MED ORDER — HYDROXYZINE HCL 25 MG PO TABS: 25.0000 mg | ORAL_TABLET | Freq: Three times a day (TID) | ORAL | 0 refills | Status: DC | PRN

## 2021-04-22 MED ORDER — MENTHOL 3 MG MT LOZG: 1.0000 | LOZENGE | OROMUCOSAL | 0 refills | Status: DC | PRN

## 2021-04-22 MED ORDER — SERTRALINE HCL 50 MG PO TABS: 50.0000 mg | ORAL_TABLET | Freq: Every day | ORAL | 0 refills | Status: DC

## 2021-04-22 NOTE — ED Notes (Signed)
Pt is in the bed sleeping. Respirations are even and unlabored. No acute distress noted. Will continue to monitor for safety. 

## 2021-04-22 NOTE — ED Provider Notes (Signed)
FBC/OBS ASAP Discharge Summary  Date and Time: 04/22/2021 9:56 AM  Name: Nathan Koch  MRN:  [id number]   Discharge Diagnoses:  Final diagnoses:  Severe episode of recurrent major depressive disorder, without psychotic features ([name])  Methamphetamine use disorder, moderate ([name])  Amphetamine-induced mood disorder ([name])  Passive suicidal ideations  Homelessness    Subjective: "I'm looking forward to going to rehab" Patient seen in AM on day of discharge. He is alert, oriented. He denies SI, HI, AH/VH nausea, vomiting, chest pain, and headache. Denies s/e from meds.  He endorses mild low back pain. Discusses factors that have led to relapses in the past and his hope that he will achieve a similar or greater level of success. Future oriented throughout assessment.  Asks after results of GC/CT testing (still pending, would not change dispo)  - verified phone and encouraged him to call [facility] if he has not gotten a call with results. He is most looking forward to eating pizza after discharge.   Stay Summary:  Patient presented to the [facility] from [facility] on 12/20 with suicidal ideations in the setting of relapse on methamphetamines. He was continued on sertraline, vistaril, and trazodone at that time; quetiapine was started on 12/23 and titrated to 50 mg during.  He last endorsed passive SI on 12/23. He was active in discharge planning and called [facility] daily throughout admission, gaining acceptance on 12/27. He was medication compliant throughout admission and attended groups, finding AA group most helpful. Spent latter half of admission focused on getting results of GC/CT testing (his wife tested positive while he was here) which unfortunately had not resulted by the time that he was discharged.   Total Time spent with patient: 15 minutes  Past Psychiatric History: methamphetamine use disorder, anxiety, asthma, r/o bipolar I (in setting of mehtamphetamine use), PTSD Past Medical History:  Past  Medical History:  Diagnosis Date   Anxiety    Anxiety disorder    Asthma    Bipolar 1 disorder ([name])    Depression    Methamphetamine abuse ([name])    PTSD (post-traumatic stress disorder)     Past Surgical History:  Procedure Laterality Date   CLOSED REDUCTION MANDIBLE N/A 07/02/2018   Procedure: CLOSED REDUCTION MANDIBULAR;  Surgeon: Vernie Murders, MD;  Location: [facility] ORS;  Service: ENT;  Laterality: N/A;   Family History:  Family History  Problem Relation Age of Onset   Asthma Mother    Cancer Mother        breast cancer   Ulcers Mother    Cancer Father        esophogeal cancer   Hypertension Father    Asthma Brother    Family Psychiatric History: denied Social History:  Social History   Substance and Sexual Activity  Alcohol Use Not Currently   Alcohol/week: 13.0 standard drinks   Types: 6 Cans of beer, 7 Standard drinks or equivalent per week   Comment: previously heavy drinker 2016. most recent 4 beer/ day drinker and none since 01-11-2020     Social History   Substance and Sexual Activity  Drug Use Yes   Types: Methamphetamines   Comment: states he uses 1 g every other day    Social History   Socioeconomic History   Marital status: Single    Spouse name: Not on file   Number of children: 1   Years of education: 10   Highest education level: 10th grade  Occupational History   Not on file  Tobacco Use   Smoking status: Every Day    Packs/day: 0.50    Years: 8.00    Pack years: 4.00    Types: Cigarettes   Smokeless tobacco: Never  Vaping Use   Vaping Use: Every day   Substances: Nicotine, Flavoring  Substance and Sexual Activity   Alcohol use: Not Currently    Alcohol/week: 13.0 standard drinks    Types: 6 Cans of beer, 7 Standard drinks or equivalent per week    Comment: previously heavy drinker 2016. most recent 4 beer/ day drinker and none since 01-11-2020   Drug use: Yes    Types: Methamphetamines    Comment: states he uses 1 g every other  day   Sexual activity: Not on file  Other Topics Concern   Not on file  Social History Narrative   Not on file   Social Determinants of Health   Financial Resource Strain: Not on file  Food Insecurity: Not on file  Transportation Needs: Not on file  Physical Activity: Not on file  Stress: Not on file  Social Connections: Not on file   SDOH:  SDOH Screenings   Alcohol Screen: Low Risk    Last Alcohol Screening Score (AUDIT): 1  Depression (PHQ2-9): Medium Risk   PHQ-2 Score: 16  Financial Resource Strain: Not on file  Food Insecurity: Not on file  Housing: Not on file  Physical Activity: Not on file  Social Connections: Not on file  Stress: Not on file  Tobacco Use: High Risk   Smoking Tobacco Use: Every Day   Smokeless Tobacco Use: Never   Passive Exposure: Not on file  Transportation Needs: Not on file    Tobacco Cessation:  A prescription for an FDA-approved tobacco cessation medication provided at discharge  Current Medications:  Current Facility-Administered Medications  Medication Dose Route Frequency Provider Last Rate Last Admin   acetaminophen (TYLENOL) tablet 650 mg  650 mg Oral Q6H PRN Rankin, Shuvon B, NP   650 mg at 04/18/21 2109   albuterol (VENTOLIN HFA) 108 (90 Base) MCG/ACT inhaler 2 puff  2 puff Inhalation Q4H PRN Jaclyn Shaggy, PA-C   2 puff at 04/21/21 2034   alum & mag hydroxide-simeth (MAALOX/MYLANTA) 200-200-20 MG/5ML suspension 30 mL  30 mL Oral Q4H PRN Rankin, Shuvon B, NP       hydrOXYzine (ATARAX) tablet 25 mg  25 mg Oral TID PRN Rankin, Shuvon B, NP   25 mg at 04/21/21 1428   magnesium hydroxide (MILK OF MAGNESIA) suspension 30 mL  30 mL Oral Daily PRN Rankin, Shuvon B, NP       menthol-cetylpyridinium (CEPACOL) lozenge 3 mg  1 lozenge Oral Q2H PRN Ardis Hughs, NP   3 mg at 04/19/21 1143   nicotine (NICODERM CQ - dosed in mg/24 hours) patch 14 mg  14 mg Transdermal Daily Oneta Rack, NP       QUEtiapine (SEROQUEL) tablet 50 mg  50  mg Oral QHS Oneta Rack, NP   50 mg at 04/21/21 2117   sertraline (ZOLOFT) tablet 50 mg  50 mg Oral QHS Melbourne Abts W, PA-C   50 mg at 04/21/21 2117   sodium chloride (OCEAN) 0.65 % nasal spray 1 spray  1 spray Each Nare PRN Jaclyn Shaggy, PA-C       traZODone (DESYREL) tablet 50 mg  50 mg Oral QHS PRN Jaclyn Shaggy, PA-C   50 mg at 04/17/21 2107   Current Outpatient Medications  Medication Sig Dispense Refill   albuterol (VENTOLIN HFA) 108 (90 Base) MCG/ACT inhaler Inhale 2 puffs into the lungs every 4 (four) hours as needed for wheezing or shortness of breath. 8 g 1   hydrOXYzine (VISTARIL) 25 MG capsule Take 1 capsule (25 mg total) by mouth 3 (three) times daily. 30 capsule 1   menthol-cetylpyridinium (CEPACOL REGULAR STRENGTH) 3 MG lozenge Take 1 lozenge (3 mg total) by mouth as needed for sore throat. 90 tablet 1   nicotine (NICODERM CQ - DOSED IN MG/24 HOURS) 14 mg/24hr patch Place 1 patch (14 mg total) onto the skin daily. 30 patch 1   QUEtiapine (SEROQUEL) 50 MG tablet Take 1 tablet (50 mg total) by mouth at bedtime. 30 tablet 1   sertraline (ZOLOFT) 50 MG tablet Take 1 tablet (50 mg total) by mouth daily. 30 tablet 1   albuterol (VENTOLIN HFA) 108 (90 Base) MCG/ACT inhaler Inhale 2 puffs into the lungs every 4 (four) hours as needed for wheezing or shortness of breath. 6.7 g 0   hydrOXYzine (ATARAX) 25 MG tablet Take 1 tablet (25 mg total) by mouth 3 (three) times daily as needed for anxiety. 42 tablet 0   menthol-cetylpyridinium (CEPACOL) 3 MG lozenge Take 1 lozenge (3 mg total) by mouth every 2 (two) hours as needed for sore throat. 42 tablet 0   nicotine (NICODERM CQ - DOSED IN MG/24 HOURS) 14 mg/24hr patch Place 1 patch (14 mg total) onto the skin daily. 14 patch 0   QUEtiapine (SEROQUEL) 50 MG tablet Take 1 tablet (50 mg total) by mouth at bedtime. 14 tablet 0   sertraline (ZOLOFT) 50 MG tablet Take 1 tablet (50 mg total) by mouth at bedtime. 14 tablet 0    PTA Medications:  (Not in a hospital admission)   Musculoskeletal  Strength & Muscle Tone: within normal limits Gait & Station: normal Patient leans: N/A  Psychiatric Specialty Exam  Presentation  General Appearance: Appropriate for Environment; Casual (poor dentition)  Eye Contact:Good  Speech:Clear and Coherent; Normal Rate  Speech Volume:Normal  Handedness:Right   Mood and Affect  Mood:-- ("Looking forward to rehab")  Affect:Congruent; Full Range   Thought Process  Thought Processes:Coherent  Descriptions of Associations:Intact  Orientation:Full (Time, Place and Person)  Thought Content:Logical; Other (comment) (future oriented)  Diagnosis of Schizophrenia or Schizoaffective disorder in past: No    Hallucinations:Hallucinations: None  Ideas of Reference:None  Suicidal Thoughts:Suicidal Thoughts: No  Homicidal Thoughts:Homicidal Thoughts: No   Sensorium  Memory:Immediate Good; Recent Good; Remote Good  Judgment:Poor  Insight:Fair   Executive Functions  Concentration:Good  Attention Span:Good  Recall:Good  Fund of Knowledge:Good  Language:Good   Psychomotor Activity  Psychomotor Activity:Psychomotor Activity: Normal   Assets  Assets:Communication Skills; Desire for Improvement   Sleep  Sleep:Sleep: Fair   No data recorded  Physical Exam  Physical Exam ROS Blood pressure 104/63, pulse 72, temperature 98.7 F (37.1 C), temperature source Oral, resp. rate 18, SpO2 99 %. There is no height or weight on file to calculate BMI.  Demographic Factors:  Male, Adolescent or young adult, Low socioeconomic status, and marital discord  Loss Factors: Financial problems/change in socioeconomic status  Historical Factors: Prior suicide attempts and Impulsivity  Risk Reduction Factors:   Responsible for children under 30 years of age, Sense of responsibility to family, Positive therapeutic relationship, Positive coping skills or problem solving skills, and  treatment of depression and substance use  Continued Clinical Symptoms:  More than one psychiatric diagnosis Previous Psychiatric  Diagnoses and Treatments  Cognitive Features That Contribute To Risk:  Thought constriction (tunnel vision)    Suicide Risk:  Mild:  Suicidal ideation of limited frequency, intensity, duration, and specificity.  There are no identifiable plans, no associated intent, mild dysphoria and related symptoms, good self-control (both objective and subjective assessment), few other risk factors, and identifiable protective factors, including available and accessible social support.  Plan Of Care/Follow-up recommendations:  Activity:  as tolerated Diet:  per inpt rehab Tests:  needs f/u GC/CT  Disposition: to [employer] A Kiamesha Samet 04/22/2021, 9:56 AM

## 2021-04-22 NOTE — ED Notes (Signed)
Discharge instructions provided and Pt stated understanding. Pt alert, orient and ambulatory prior to d/c from facility. Personal belongings returned from locker number 18. [employer] [facility] called for transportation services. Prescriptions and samples given to driver at time of d/c from facility. Pt escorted to the sally port. Safety maintained.

## 2021-04-22 NOTE — ED Notes (Signed)
Pt resting in his room on the bed. No distress observed. Safety maintained and will continue to monitor.

## 2021-04-22 NOTE — ED Notes (Signed)
Left voicemail for [facility] to return call to set up transportation to [facility].

## 2021-04-22 NOTE — ED Notes (Signed)
[facility] returned call. Transportation arranged for services to [facility] in [place].

## 2021-04-22 NOTE — ED Notes (Signed)
Breakfast provided and Pt refused Nicotine patch.

## 2021-04-25 ENCOUNTER — Telehealth (HOSPITAL_COMMUNITY): Payer: Self-pay

## 2021-04-25 NOTE — BH Assessment (Signed)
Care Management - Follow Up [facility] Discharges   Patient has been placed in an inpatient psychiatric hospital ([facility]) on 04-22-21.

## 2021-07-23 ENCOUNTER — Emergency Department: Payer: Self-pay

## 2021-07-23 ENCOUNTER — Emergency Department
Admission: EM | Admit: 2021-07-23 | Discharge: 2021-07-23 | Disposition: A | Discharge status: Home / Self Care | Payer: Self-pay | Attending: Emergency Medicine | Admitting: Emergency Medicine

## 2021-07-23 DIAGNOSIS — M549 Dorsalgia, unspecified: Secondary | ICD-10-CM | POA: Insufficient documentation

## 2021-07-23 DIAGNOSIS — R0602 Shortness of breath: Secondary | ICD-10-CM | POA: Insufficient documentation

## 2021-07-23 DIAGNOSIS — Z008 Encounter for other general examination: Secondary | ICD-10-CM

## 2021-07-23 DIAGNOSIS — R079 Chest pain, unspecified: Secondary | ICD-10-CM | POA: Insufficient documentation

## 2021-07-23 DIAGNOSIS — J45909 Unspecified asthma, uncomplicated: Secondary | ICD-10-CM | POA: Insufficient documentation

## 2021-07-23 NOTE — ED Provider Notes (Signed)
? ?[facility] ?Provider Note ? ? ? Event Date/Time  ? First MD Initiated Contact with Patient 07/23/21 1130   ?  (approximate) ? ? ?History  ? ?Medical Clearance ? ? ?HPI ? ?Nathan Koch is a 31 y.o. male past medical history bipolar disorder, depression, methamphetamine use who presents in custody for medical clearance.  He was picked up for using meth.  Apparently blood pressure was elevated so he was taken here for clearance.  Patient denies any symptoms although when specifically asked about chest pain he does endorse some chest pain last night that has mainly gone away.  Mild shortness of breath.  Endorses chronic right side and back pain but this is not new for him. ? ?Past Medical History:  ?Diagnosis Date  ? Anxiety   ? Anxiety disorder   ? Asthma   ? Bipolar 1 disorder ([name])   ? Depression   ? Methamphetamine abuse ([name])   ? PTSD (post-traumatic stress disorder)   ? ? ?Patient Active Problem List  ? Diagnosis Date Noted  ? Passive suicidal ideations 04/16/2021  ? Homelessness 04/16/2021  ? Substance induced mood disorder ([name]) 04/16/2021  ? 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 Severe recurrent major depression without psychotic features ([name]) 01/16/2021  ? Bipolar 1 disorder, depressed ([name])   ? MDD (major depressive disorder), recurrent episode, severe ([name]) 09/11/2020  ? MDD (major depressive disorder), recurrent severe, without psychosis ([name]) 09/10/2020  ? Suicidal ideation   ? Depression, major, recurrent, 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  ? Methamphetamine use disorder, severe ([name]) 09/14/2019  ? MDD (major depressive disorder), recurrent, severe, with psychosis ([name]) 09/14/2019  ? MDD (major depressive disorder), single episode, severe with psychosis ([name]) 09/14/2019  ? Tobacco use disorder 02/21/2019  ? PTSD (post-traumatic stress disorder) 02/21/2019  ? Benzodiazepine abuse ([name]) 11/15/2017  ? Asthma 06/26/2014  ? ? ? ?Physical Exam  ?Triage Vital  Signs: ?ED Triage Vitals  ?Enc Vitals Group  ?   BP 07/23/21 1121 (!) 133/54  ?   Pulse Rate 07/23/21 1121 (!) 106  ?   Resp 07/23/21 1121 18  ?   Temp 07/23/21 1121 98.5 ?F (36.9 ?C)  ?   Temp Source 07/23/21 1121 Oral  ?   SpO2 07/23/21 1121 96 %  ?   Weight 07/23/21 1122 220 lb (99.8 kg)  ?   Height --   ?   Head Circumference --   ?   Peak Flow --   ?   Pain Score 07/23/21 1122 10  ?   Pain Loc --   ?   Pain Edu? --   ?   Excl. in GC? --   ? ? ?Most recent vital signs: ?Vitals:  ? 07/23/21 1121  ?BP: (!) 133/54  ?Pulse: (!) 106  ?Resp: 18  ?Temp: 98.5 ?F (36.9 ?C)  ?SpO2: 96%  ? ? ? ?General: Awake, no distress.  Conjunctival injection bilaterally ?CV:  Good peripheral perfusion.  ?Resp:  Normal effort.  ?Abd:  No distention.  ?Neuro:             Awake, Alert, Oriented x 3  ?Other:   ? ? ?ED Results / Procedures / Treatments  ?Labs ?(all labs ordered are listed, but only abnormal results are displayed) ?Labs Reviewed - No data to display ? ? ?EKG ? ?EKG interpretation performed by myself: NSR, nml axis, nml intervals, no acute ischemic changes ? ? ? ?RADIOLOGY ?I reviewed the CXR which  does not show any acute cardiopulmonary process; agree with radiology report  ? ? ? ?PROCEDURES: ? ?Critical Care performed: No ? ?Procedures ? ? ?MEDICATIONS ORDERED IN ED: ?Medications - No data to display ? ? ?IMPRESSION / MDM / ASSESSMENT AND PLAN / ED COURSE  ?I reviewed the triage vital signs and the nursing notes. ?             ?               ? ?Patient is a 31 year old male who presents in custody for medical clearance.  He was arrested for using meth and apparently was hypertensive.  Blood pressure is actually within normal limits here 133/50.  Patient is alert and oriented and pleasant.  Does not initially have any complaints but then when I asked him specifically about chest pain does say yes he did have some last night but it is largely improved.  Also has minimal shortness of breath.  Would not have come to the ED  for this if he were not in custody.  EKG does not show ischemic changes chest x-ray also within normal limits.  Suspicion for PE or ACS is quite low.  I think he is appropriate to go back into custody. ? ? ?FINAL CLINICAL IMPRESSION(S) / ED DIAGNOSES  ? ?Final diagnoses:  ?Medical clearance for incarceration  ? ? ? ?Rx / DC Orders  ? ?ED Discharge Orders   ? ? None  ? ?  ? ? ? ?Note:  This document was prepared using Dragon voice recognition software and may include unintentional dictation errors. ?  ?Georga Hacking, MD ?07/23/21 1615 ? ?

## 2021-07-23 NOTE — ED Triage Notes (Addendum)
Pt is under arrest and needs medical clearance for HTN. Pt is 133/54 here. No other complaints except bilateral side pain that pt says "is nothing to worry about". Pt used meth about an hour ago.  ?

## 2021-11-30 ENCOUNTER — Other Ambulatory Visit: Payer: Self-pay

## 2021-11-30 ENCOUNTER — Inpatient Hospital Stay
Admission: EM | Admit: 2021-11-30 | Discharge: 2021-12-04 | DRG: 557 | Disposition: A | Discharge status: Other Acute Inpatient Hospital | Payer: Self-pay | Attending: Osteopathic Medicine | Admitting: Osteopathic Medicine

## 2021-11-30 ENCOUNTER — Encounter: Payer: Self-pay | Admitting: Emergency Medicine

## 2021-11-30 DIAGNOSIS — R7989 Other specified abnormal findings of blood chemistry: Secondary | ICD-10-CM

## 2021-11-30 DIAGNOSIS — Z825 Family history of asthma and other chronic lower respiratory diseases: Secondary | ICD-10-CM

## 2021-11-30 DIAGNOSIS — R253 Fasciculation: Secondary | ICD-10-CM | POA: Diagnosis present

## 2021-11-30 DIAGNOSIS — Z8249 Family history of ischemic heart disease and other diseases of the circulatory system: Secondary | ICD-10-CM

## 2021-11-30 DIAGNOSIS — Z91040 Latex allergy status: Secondary | ICD-10-CM

## 2021-11-30 DIAGNOSIS — F319 Bipolar disorder, unspecified: Secondary | ICD-10-CM | POA: Diagnosis present

## 2021-11-30 DIAGNOSIS — N179 Acute kidney failure, unspecified: Principal | ICD-10-CM

## 2021-11-30 DIAGNOSIS — F15929 Other stimulant use, unspecified with intoxication, unspecified: Secondary | ICD-10-CM

## 2021-11-30 DIAGNOSIS — F1520 Other stimulant dependence, uncomplicated: Secondary | ICD-10-CM | POA: Diagnosis present

## 2021-11-30 DIAGNOSIS — F17210 Nicotine dependence, cigarettes, uncomplicated: Secondary | ICD-10-CM | POA: Diagnosis present

## 2021-11-30 DIAGNOSIS — M6282 Rhabdomyolysis: Principal | ICD-10-CM

## 2021-11-30 DIAGNOSIS — N170 Acute kidney failure with tubular necrosis: Secondary | ICD-10-CM | POA: Diagnosis present

## 2021-11-30 DIAGNOSIS — E860 Dehydration: Secondary | ICD-10-CM | POA: Diagnosis present

## 2021-11-30 DIAGNOSIS — D72829 Elevated white blood cell count, unspecified: Secondary | ICD-10-CM | POA: Diagnosis present

## 2021-11-30 DIAGNOSIS — F15229 Other stimulant dependence with intoxication, unspecified: Secondary | ICD-10-CM | POA: Diagnosis present

## 2021-11-30 DIAGNOSIS — Z803 Family history of malignant neoplasm of breast: Secondary | ICD-10-CM

## 2021-11-30 DIAGNOSIS — F419 Anxiety disorder, unspecified: Secondary | ICD-10-CM | POA: Diagnosis present

## 2021-11-30 DIAGNOSIS — R45851 Suicidal ideations: Secondary | ICD-10-CM

## 2021-11-30 DIAGNOSIS — J45909 Unspecified asthma, uncomplicated: Secondary | ICD-10-CM | POA: Diagnosis present

## 2021-11-30 DIAGNOSIS — F1910 Other psychoactive substance abuse, uncomplicated: Secondary | ICD-10-CM

## 2021-11-30 DIAGNOSIS — F4310 Post-traumatic stress disorder, unspecified: Secondary | ICD-10-CM | POA: Diagnosis present

## 2021-11-30 LAB — ETHANOL: Alcohol, Ethyl (B): <10 mg/dL (ref <10)

## 2021-11-30 LAB — HEPATIC FUNCTION PANEL
ALT: 50 U/L — ABNORMAL HIGH (ref 0–44)
AST: 125 U/L — ABNORMAL HIGH (ref 15–41)
Albumin: 4.2 g/dL (ref 3.5–5.0)
Alkaline Phosphatase: 60 U/L (ref 38–126)
Bilirubin, Direct: 0.3 mg/dL — ABNORMAL HIGH (ref 0.0–0.2)
Indirect Bilirubin: 2.2 mg/dL — ABNORMAL HIGH (ref 0.3–0.9)
Total Bilirubin: 2.5 mg/dL — ABNORMAL HIGH (ref 0.3–1.2)
Total Protein: 7.9 g/dL (ref 6.5–8.1)

## 2021-11-30 LAB — COMPREHENSIVE METABOLIC PANEL
ALT: 55 U/L — ABNORMAL HIGH (ref 0–44)
AST: 144 U/L — ABNORMAL HIGH (ref 15–41)
Albumin: 5 g/dL (ref 3.5–5.0)
Alkaline Phosphatase: 73 U/L (ref 38–126)
Anion gap: 14 (ref 5–15)
BUN: 34 mg/dL — ABNORMAL HIGH (ref 6–20)
CO2: 20 mmol/L — ABNORMAL LOW (ref 22–32)
Calcium: 9.5 mg/dL (ref 8.9–10.3)
Chloride: 102 mmol/L (ref 98–111)
Creatinine, Ser: 1.63 mg/dL — ABNORMAL HIGH (ref 0.61–1.24)
GFR, Estimated: 58 mL/min — ABNORMAL LOW (ref >60)
Glucose, Bld: 159 mg/dL — ABNORMAL HIGH (ref 70–99)
Potassium: 3.7 mmol/L (ref 3.5–5.1)
Sodium: 136 mmol/L (ref 135–145)
Total Bilirubin: 2.9 mg/dL — ABNORMAL HIGH (ref 0.3–1.2)
Total Protein: 9 g/dL — ABNORMAL HIGH (ref 6.5–8.1)

## 2021-11-30 LAB — BASIC METABOLIC PANEL
Anion gap: 7 (ref 5–15)
BUN: 26 mg/dL — ABNORMAL HIGH (ref 6–20)
CO2: 28 mmol/L (ref 22–32)
Calcium: 8.9 mg/dL (ref 8.9–10.3)
Chloride: 102 mmol/L (ref 98–111)
Creatinine, Ser: 1.16 mg/dL (ref 0.61–1.24)
GFR, Estimated: >60 mL/min (ref >60)
Glucose, Bld: 116 mg/dL — ABNORMAL HIGH (ref 70–99)
Potassium: 3.7 mmol/L (ref 3.5–5.1)
Sodium: 137 mmol/L (ref 135–145)

## 2021-11-30 LAB — CBC
HCT: 47.2 % (ref 39.0–52.0)
Hemoglobin: 15.6 g/dL (ref 13.0–17.0)
MCH: 27.5 pg (ref 26.0–34.0)
MCHC: 33.1 g/dL (ref 30.0–36.0)
MCV: 83.2 fL (ref 80.0–100.0)
Platelets: 432 10*3/uL — ABNORMAL HIGH (ref 150–400)
RBC: 5.67 MIL/uL (ref 4.22–5.81)
RDW: 13.2 % (ref 11.5–15.5)
WBC: 14.7 10*3/uL — ABNORMAL HIGH (ref 4.0–10.5)
nRBC: 0 % (ref 0.0–0.2)

## 2021-11-30 LAB — URINE DRUG SCREEN, QUALITATIVE (ARMC ONLY)
Amphetamines, Ur Screen: POSITIVE — AB
Barbiturates, Ur Screen: NOT DETECTED
Benzodiazepine, Ur Scrn: NOT DETECTED
Cannabinoid 50 Ng, Ur ~~LOC~~: NOT DETECTED
Cocaine Metabolite,Ur ~~LOC~~: NOT DETECTED
MDMA (Ecstasy)Ur Screen: NOT DETECTED
Methadone Scn, Ur: NOT DETECTED
Opiate, Ur Screen: NOT DETECTED
Phencyclidine (PCP) Ur S: NOT DETECTED
Tricyclic, Ur Screen: NOT DETECTED

## 2021-11-30 LAB — CK
Total CK: 7469 U/L — ABNORMAL HIGH (ref 49–397)
Total CK: 6602 U/L — ABNORMAL HIGH (ref 49–397)

## 2021-11-30 LAB — ACETAMINOPHEN LEVEL: Acetaminophen (Tylenol), Serum: <10 ug/mL — ABNORMAL LOW (ref 10–30)

## 2021-11-30 LAB — SALICYLATE LEVEL: Salicylate Lvl: <7 mg/dL — ABNORMAL LOW (ref 7.0–30.0)

## 2021-11-30 MED ORDER — ONDANSETRON HCL 4 MG PO TABS: 4.0000 mg | ORAL_TABLET | Freq: Four times a day (QID) | ORAL | Status: DC | PRN

## 2021-11-30 MED ORDER — SERTRALINE HCL 50 MG PO TABS
50.0000 mg | ORAL_TABLET | Freq: Every day | ORAL | Status: DC
  Administered 2021-12-01 – 2021-12-04 (×4): 50 mg via ORAL
  Filled 2021-11-30 (×4): qty 1

## 2021-11-30 MED ORDER — LACTATED RINGERS IV BOLUS
1000.0000 mL | Freq: Once | INTRAVENOUS | Status: AC
  Administered 2021-11-30: 1000 mL via INTRAVENOUS

## 2021-11-30 MED ORDER — SODIUM CHLORIDE 0.9 % IV BOLUS
1000.0000 mL | Freq: Once | INTRAVENOUS | Status: AC
  Administered 2021-11-30: 1000 mL via INTRAVENOUS

## 2021-11-30 MED ORDER — ALBUTEROL SULFATE (2.5 MG/3ML) 0.083% IN NEBU
3.0000 mL | INHALATION_SOLUTION | RESPIRATORY_TRACT | Status: DC | PRN
  Administered 2021-12-01 – 2021-12-02 (×2): 3 mL via RESPIRATORY_TRACT
  Filled 2021-11-30 (×5): qty 3

## 2021-11-30 MED ORDER — QUETIAPINE FUMARATE 25 MG PO TABS
50.0000 mg | ORAL_TABLET | Freq: Every day | ORAL | Status: DC
  Administered 2021-11-30 – 2021-12-02 (×3): 50 mg via ORAL
  Filled 2021-11-30 (×3): qty 2

## 2021-11-30 MED ORDER — ACETAMINOPHEN 650 MG RE SUPP: 650.0000 mg | Freq: Four times a day (QID) | RECTAL | Status: DC | PRN

## 2021-11-30 MED ORDER — HYDROXYZINE HCL 25 MG PO TABS: 25.0000 mg | ORAL_TABLET | Freq: Three times a day (TID) | ORAL | Status: DC | PRN

## 2021-11-30 MED ORDER — ENOXAPARIN SODIUM 60 MG/0.6ML IJ SOSY
0.5000 mg/kg | PREFILLED_SYRINGE | INTRAMUSCULAR | Status: DC
  Administered 2021-12-03: 50 mg via SUBCUTANEOUS
  Filled 2021-11-30 (×3): qty 0.6

## 2021-11-30 MED ORDER — SODIUM CHLORIDE 0.9 % IV SOLN: INTRAVENOUS | Status: DC

## 2021-11-30 MED ORDER — ACETAMINOPHEN 325 MG PO TABS: 650.0000 mg | ORAL_TABLET | Freq: Four times a day (QID) | ORAL | Status: DC | PRN

## 2021-11-30 MED ORDER — ONDANSETRON HCL 4 MG/2ML IJ SOLN: 4.0000 mg | Freq: Four times a day (QID) | INTRAMUSCULAR | Status: DC | PRN

## 2021-11-30 NOTE — ED Notes (Signed)
VOL/  PENDING  CONSULT 

## 2021-11-30 NOTE — ED Notes (Signed)
Dr. Funke at bedside. 

## 2021-11-30 NOTE — Assessment & Plan Note (Addendum)
Could be secondary to [facility] Had modestly elevated AST and ALT about 7 months ago in the 40s, but now elevated 144/55 down to 125/50 with hydration

## 2021-11-30 NOTE — ED Notes (Signed)
Pt received snacks.

## 2021-11-30 NOTE — ED Triage Notes (Signed)
Pt via [employer], voluntary. Pt states he is having martial issues and relapsed on meth. Pt states he used meth 5 days ago. Denies any other substances. Denies any ETOH. Pt is SI. States that he has been out of his medication for about a week. Pt is calm and cooperative.

## 2021-11-30 NOTE — H&P (Signed)
History and Physical    Patient: Nathan Koch [id number]:[id number] DOB: 11-03-1990 DOA: 11/30/2021 DOS: the patient was seen and examined on 11/30/2021 PCP: Pcp, No  Patient coming from: Home  Chief Complaint:  Chief Complaint  Patient presents with   Suicidal    HPI: Nathan Koch is a 31 y.o. male with medical history significant for Methamphetamine use who presented to the ED voluntarily reporting a relapse on meth as well as suicidal ideation.  He requested to see psychiatry.  He was noted in the ED to be ' tweaking' with picking of the skin and somewhat hyperactive and a work-up was done. ED course and data review: Afebrile, tachycardic to 112 with otherwise normal vitals Labs: Initial CBC showed WBC of 14,700 and otherwise unremarkable.  Initial CMP showed creatinine of 1.63 with a bicarb of 20 as well as elevated AST of 144 and ALT of 55 with total bili 2.9 Repeat BMP 5 hours later showed normalization of creatinine to 1.16.  UDS positive for amphetamines.  Acetaminophen salicylate and alcohol levels were all undetectable CK was done and resulted at 6602  Psychiatry consult pending Hospitalist consulted for admission for rhabdomyolysis and medical clearance pending further evaluation by psych.    Past Medical History:  Diagnosis Date   Anxiety    Anxiety disorder    Asthma    Bipolar 1 disorder ([name])    Depression    Methamphetamine abuse ([name])    PTSD (post-traumatic stress disorder)    Past Surgical History:  Procedure Laterality Date   CLOSED REDUCTION MANDIBLE N/A 07/02/2018   Procedure: CLOSED REDUCTION MANDIBULAR;  Surgeon: Vernie Murders, MD;  Location: [facility] ORS;  Service: ENT;  Laterality: N/A;   Social History:  reports that he has been smoking cigarettes. He has a 4.00 pack-year smoking history. He has never used smokeless tobacco. He reports that he does not currently use alcohol after a past usage of about 13.0 standard drinks of alcohol per week. He reports current  drug use. Drug: Methamphetamines.  Allergies  Allergen Reactions   Latex Hives    Family History  Problem Relation Age of Onset   Asthma Mother    Cancer Mother        breast cancer   Ulcers Mother    Cancer Father        esophogeal cancer   Hypertension Father    Asthma Brother     Prior to Admission medications   Medication Sig Start Date End Date Taking? Authorizing Provider  albuterol (VENTOLIN HFA) 108 (90 Base) MCG/ACT inhaler Inhale 2 puffs into the lungs every 4 (four) hours as needed for wheezing or shortness of breath. 04/22/21   Ardis Hughs, NP  albuterol (VENTOLIN HFA) 108 (90 Base) MCG/ACT inhaler Inhale 2 puffs into the lungs every 4 (four) hours as needed for wheezing or shortness of breath. 04/22/21   Ardis Hughs, NP  hydrOXYzine (ATARAX) 25 MG tablet Take 1 tablet (25 mg total) by mouth 3 (three) times daily as needed for anxiety. 04/22/21   Ardis Hughs, NP  hydrOXYzine (VISTARIL) 25 MG capsule Take 1 capsule (25 mg total) by mouth 3 (three) times daily. 04/22/21   Ardis Hughs, NP  menthol-cetylpyridinium (CEPACOL REGULAR STRENGTH) 3 MG lozenge Take 1 lozenge (3 mg total) by mouth as needed for sore throat. 04/22/21   Ardis Hughs, NP  menthol-cetylpyridinium (CEPACOL) 3 MG lozenge Take 1 lozenge (3 mg total) by mouth every 2 (two) hours  as needed for sore throat. 04/22/21   Ardis Hughs, NP  nicotine (NICODERM CQ - DOSED IN MG/24 HOURS) 14 mg/24hr patch Place 1 patch (14 mg total) onto the skin daily. 04/22/21   Ardis Hughs, NP  nicotine (NICODERM CQ - DOSED IN MG/24 HOURS) 14 mg/24hr patch Place 1 patch (14 mg total) onto the skin daily. 04/22/21 04/22/22  Ardis Hughs, NP  QUEtiapine (SEROQUEL) 50 MG tablet Take 1 tablet (50 mg total) by mouth at bedtime. 04/22/21   Ardis Hughs, NP  QUEtiapine (SEROQUEL) 50 MG tablet Take 1 tablet (50 mg total) by mouth at bedtime. 04/22/21   Ardis Hughs, NP   sertraline (ZOLOFT) 50 MG tablet Take 1 tablet (50 mg total) by mouth at bedtime. 04/22/21   Ardis Hughs, NP  sertraline (ZOLOFT) 50 MG tablet Take 1 tablet (50 mg total) by mouth daily. 04/22/21 04/22/22  Ardis Hughs, NP    Physical Exam: Vitals:   11/30/21 1130 11/30/21 1136 11/30/21 1914  BP:  (!) 132/95 126/74  Pulse:  (!) 112 83  Resp:  20 20  Temp:  98.4 F (36.9 C) 97.7 F (36.5 C)  TempSrc:   Oral  SpO2:  97% 99%  Weight: 102.1 kg    Height: 5\' 9"  (1.753 m)     Physical Exam Vitals and nursing note reviewed.  Constitutional:      General: He is sleeping. He is not in acute distress.    Comments: Restless, and picking at skin and in the air  HENT:     Head: Normocephalic and atraumatic.  Cardiovascular:     Rate and Rhythm: Regular rhythm. Tachycardia present.     Heart sounds: Normal heart sounds.  Pulmonary:     Effort: Pulmonary effort is normal.     Breath sounds: Normal breath sounds.  Abdominal:     Palpations: Abdomen is soft.     Tenderness: There is no abdominal tenderness.  Neurological:     General: No focal deficit present.     Mental Status: He is easily aroused.     Labs on Admission: I have personally reviewed following labs and imaging studies  CBC: Recent Labs  Lab 11/30/21 1137  WBC 14.7*  HGB 15.6  HCT 47.2  MCV 83.2  PLT 432*   Basic Metabolic Panel: Recent Labs  Lab 11/30/21 1137 11/30/21 1558  NA 136 137  K 3.7 3.7  CL 102 102  CO2 20* 28  GLUCOSE 159* 116*  BUN 34* 26*  CREATININE 1.63* 1.16  CALCIUM 9.5 8.9   GFR: Estimated Creatinine Clearance: 109.7 mL/min (by C-G formula based on SCr of 1.16 mg/dL). Liver Function Tests: Recent Labs  Lab 11/30/21 1137  AST 144*  ALT 55*  ALKPHOS 73  BILITOT 2.9*  PROT 9.0*  ALBUMIN 5.0   No results for input(s): "LIPASE", "AMYLASE" in the last 168 hours. No results for input(s): "AMMONIA" in the last 168 hours. Coagulation Profile: No results for  input(s): "INR", "PROTIME" in the last 168 hours. Cardiac Enzymes: Recent Labs  Lab 11/30/21 1441 11/30/21 1819  CKTOTAL 7,469* 6,602*   BNP (last 3 results) No results for input(s): "PROBNP" in the last 8760 hours. HbA1C: No results for input(s): "HGBA1C" in the last 72 hours. CBG: No results for input(s): "GLUCAP" in the last 168 hours. Lipid Profile: No results for input(s): "CHOL", "HDL", "LDLCALC", "TRIG", "CHOLHDL", "LDLDIRECT" in the last 72 hours. Thyroid Function Tests: No results for  input(s): "TSH", "T4TOTAL", "FREET4", "T3FREE", "THYROIDAB" in the last 72 hours. Anemia Panel: No results for input(s): "VITAMINB12", "FOLATE", "FERRITIN", "TIBC", "IRON", "RETICCTPCT" in the last 72 hours. Urine analysis:    Component Value Date/Time   COLORURINE YELLOW 11/30/2020 1747   APPEARANCEUR CLEAR 11/30/2020 1747   APPEARANCEUR Clear 06/17/2012 2222   LABSPEC 1.016 11/30/2020 1747   LABSPEC 1.019 06/17/2012 2222   PHURINE 7.0 11/30/2020 1747   GLUCOSEU NEGATIVE 11/30/2020 1747   GLUCOSEU Negative 06/17/2012 2222   HGBUR NEGATIVE 11/30/2020 1747   BILIRUBINUR NEGATIVE 11/30/2020 1747   BILIRUBINUR Negative 06/17/2012 2222   KETONESUR NEGATIVE 11/30/2020 1747   PROTEINUR NEGATIVE 11/30/2020 1747   NITRITE NEGATIVE 11/30/2020 1747   LEUKOCYTESUR NEGATIVE 11/30/2020 1747   LEUKOCYTESUR Negative 06/17/2012 2222    Radiological Exams on Admission: No results found.   Data Reviewed: Relevant notes from primary care and specialist visits, past discharge summaries as available in EHR, including Care Everywhere. Prior diagnostic testing as pertinent to current admission diagnoses Updated medications and problem lists for reconciliation ED course, including vitals, labs, imaging, treatment and response to treatment Triage notes, nursing and pharmacy notes and ED provider's notes Notable results as noted in HPI   Assessment and Plan: * Rhabdomyolysis Nontraumatic CK over  6000 Continue to hydrate and monitor CK  Elevated LFTs Could be secondary to Rhabdo Had modestly elevated AST and ALT about 7 months ago in the 40s, but now elevated 144/55 down to 125/50 with hydration Will monitor for improvement with hydration and further workup can be done if still elevated Ordered acute hepatitis panel  Methamphetamine intoxication ([name]) Methamphetamine use disorder, severe Patient was reportedly "tweaking" skin picking and twitching and hyperactive in the emergency room Supportive care  AKI (acute kidney injury) ([name]) Resolved Creatinine was initially 1.63 and normalized with IV hydration in the ED Likely secondary to ATN from dehydration and rhabdo Continue IV hydration  Suicidal ideation Patient came in voluntarily Suicide precautions Behavioral health consulted: Discussed with on-call psych, Lerry Liner, NP and they will admit patient to inpatient psych as soon as he is medically cleared        DVT prophylaxis: Lovenox  Consults: none  Advance Care Planning:   Code Status: Prior   Family Communication: none  Disposition Plan: Back to previous home environment  Severity of Illness: The appropriate patient status for this patient is OBSERVATION. Observation status is judged to be reasonable and necessary in order to provide the required intensity of service to ensure the patient's safety. The patient's presenting symptoms, physical exam findings, and initial radiographic and laboratory data in the context of their medical condition is felt to place them at decreased risk for further clinical deterioration. Furthermore, it is anticipated that the patient will be medically stable for discharge from the hospital within 2 midnights of admission.   Author: Andris Baumann, MD 11/30/2021 8:43 PM  For on call review [email or web address].

## 2021-11-30 NOTE — Assessment & Plan Note (Addendum)
traumatic CK over 6000 --cont MIVF

## 2021-11-30 NOTE — Assessment & Plan Note (Signed)
Methamphetamine use disorder, severe Patient was reportedly "tweaking" skin picking and twitching and hyperactive in the emergency room Supportive care

## 2021-11-30 NOTE — Assessment & Plan Note (Addendum)
Patient came in voluntarily Suicide precautions Behavioral health consulted --psych to follow --d/c IVC today

## 2021-11-30 NOTE — BH Assessment (Signed)
Patient will be recommended for inpatient psychiatric hospitalization once medically cleared.

## 2021-11-30 NOTE — ED Provider Notes (Signed)
[facility] Provider Note    Event Date/Time   First MD Initiated Contact with Patient 11/30/21 1434     (approximate)   History   Suicidal   HPI  Nathan Koch is a 31 y.o. male who comes in voluntary after having some marital issues and relapsing on meth.  He reports he last used 3 days ago contrary to triage note denies any EtOH use.  He does report some SI.  He is requesting to see psychiatry today.   Physical Exam   Triage Vital Signs: ED Triage Vitals  Enc Vitals Group     BP 11/30/21 1136 (!) 132/95     Pulse Rate 11/30/21 1136 (!) 112     Resp 11/30/21 1136 20     Temp 11/30/21 1136 98.4 F (36.9 C)     Temp src --      SpO2 11/30/21 1136 97 %     Weight 11/30/21 1130 225 lb (102.1 kg)     Height 11/30/21 1130 5\' 9"  (1.753 m)     Head Circumference --      Peak Flow --      Pain Score 11/30/21 1130 0     Pain Loc --      Pain Edu? --      Excl. in GC? --     Most recent vital signs: Vitals:   11/30/21 1136  BP: (!) 132/95  Pulse: (!) 112  Resp: 20  Temp: 98.4 F (36.9 C)  SpO2: 97%     General: Awake, no distress.  CV:  Good peripheral perfusion.  Resp:  Normal effort.  Abd:  No distention.  Soft nontender Other:  Moving all extremities well   ED Results / Procedures / Treatments   Labs (all labs ordered are listed, but only abnormal results are displayed) Labs Reviewed  COMPREHENSIVE METABOLIC PANEL - Abnormal; Notable for the following components:      Result Value   CO2 20 (*)    Glucose, Bld 159 (*)    BUN 34 (*)    Creatinine, Ser 1.63 (*)    Total Protein 9.0 (*)    AST 144 (*)    ALT 55 (*)    Total Bilirubin 2.9 (*)    GFR, Estimated 58 (*)    All other components within normal limits  SALICYLATE LEVEL - Abnormal; Notable for the following components:   Salicylate Lvl <7.0 (*)    All other components within normal limits  ACETAMINOPHEN LEVEL - Abnormal; Notable for the following components:    Acetaminophen (Tylenol), Serum <10 (*)    All other components within normal limits  CBC - Abnormal; Notable for the following components:   WBC 14.7 (*)    Platelets 432 (*)    All other components within normal limits  ETHANOL  URINE DRUG SCREEN, QUALITATIVE ([facility] ONLY)     IMPRESSION / MDM / ASSESSMENT AND PLAN / ED COURSE  I reviewed the triage vital signs and the nursing notes.   Patient's presentation is most consistent with acute presentation with potential threat to life or bodily function.   CBC shows slightly elevated white count similar to prior but he denies any other infectious symptoms.  His labs show an elevated creatinine with slight elevation of his LFTs.  Does have some LFT elevations previously.  We will give him a liter of fluid add on CK.  He has no right upper quadrant tenderness to suggest abdominal  pathology. Pt handed off pending CK   Pt is without any acute medical complaints. No exam findings to suggest medical cause of current presentation. Will order psychiatric screening labs and discuss further w/ psychiatric service.  D/d includes but is not limited to psychiatric disease, behavioral/personality disorder, inadequate socioeconomic support, medical.   The patient has been placed in psychiatric observation due to the need to provide a safe environment for the patient while obtaining psychiatric consultation and evaluation, as well as ongoing medical and medication management to treat the patient's condition.  The patient has not been placed under full IVC at this time.      FINAL CLINICAL IMPRESSION(S) / ED DIAGNOSES   Final diagnoses:  AKI (acute kidney injury) ([name])  Abnormal LFTs  Substance abuse ([name])     Rx / DC Orders   ED Discharge Orders     None        Note:  This document was prepared using Dragon voice recognition software and may include unintentional dictation errors.   Concha Se, MD 11/30/21 [phone]

## 2021-11-30 NOTE — Assessment & Plan Note (Addendum)
Resolved Creatinine was initially 1.63 and normalized with IV hydration in the ED Likely secondary to ATN from dehydration and rhabdo Continue IV hydration

## 2021-11-30 NOTE — ED Provider Notes (Signed)
Labs reviewed. Cr 1.63, CK 7469 likely 2/2 his meth use/dehydration. IVF given. Will repeat after 2L fluids. CK>5000 but McMahon score 3, placing him at low risk. Will repeat and if downtrending, likely medically clear for psych dispo with encouraged PO hydration.  Creatinine downtrending but remains significantly elevated at over 6000.  Will medically admit for further monitoring.  Repeat kidney function seems to be significantly improving.  Patient otherwise stable.  He does endorse suicidal ideation to me, will place IVC.  TTS and psychiatry has been consulted.   Shaune Pollack, MD 11/30/21 [phone]

## 2021-11-30 NOTE — ED Notes (Signed)
2 bracelets Watch Lighter 1 ring Shoes.  Shorts Shirt Pt requested to toss socks in trash. Phone  All in bag

## 2021-12-01 DIAGNOSIS — R45851 Suicidal ideations: Secondary | ICD-10-CM | POA: Diagnosis not present

## 2021-12-01 LAB — COMPREHENSIVE METABOLIC PANEL
ALT: 41 U/L (ref 0–44)
AST: 94 U/L — ABNORMAL HIGH (ref 15–41)
Albumin: 3.4 g/dL — ABNORMAL LOW (ref 3.5–5.0)
Alkaline Phosphatase: 49 U/L (ref 38–126)
Anion gap: 5 (ref 5–15)
BUN: 18 mg/dL (ref 6–20)
CO2: 24 mmol/L (ref 22–32)
Calcium: 8.3 mg/dL — ABNORMAL LOW (ref 8.9–10.3)
Chloride: 107 mmol/L (ref 98–111)
Creatinine, Ser: 0.79 mg/dL (ref 0.61–1.24)
GFR, Estimated: >60 mL/min (ref >60)
Glucose, Bld: 127 mg/dL — ABNORMAL HIGH (ref 70–99)
Potassium: 3.4 mmol/L — ABNORMAL LOW (ref 3.5–5.1)
Sodium: 136 mmol/L (ref 135–145)
Total Bilirubin: 1.9 mg/dL — ABNORMAL HIGH (ref 0.3–1.2)
Total Protein: 6.4 g/dL — ABNORMAL LOW (ref 6.5–8.1)

## 2021-12-01 LAB — CK: Total CK: 3500 U/L — ABNORMAL HIGH (ref 49–397)

## 2021-12-01 LAB — HEPATITIS PANEL, ACUTE
HCV Ab: NONREACTIVE
Hep A IgM: NONREACTIVE
Hep B C IgM: NONREACTIVE
Hepatitis B Surface Ag: NONREACTIVE

## 2021-12-01 LAB — HIV ANTIBODY (ROUTINE TESTING W REFLEX): HIV Screen 4th Generation wRfx: NONREACTIVE

## 2021-12-01 MED ORDER — POTASSIUM CHLORIDE CRYS ER 20 MEQ PO TBCR
40.0000 meq | EXTENDED_RELEASE_TABLET | Freq: Once | ORAL | Status: AC
  Administered 2021-12-01: 40 meq via ORAL
  Filled 2021-12-01: qty 2

## 2021-12-01 NOTE — Progress Notes (Signed)
  PROGRESS NOTE    Nathan Koch  [id number] DOB: 10-Sep-1990 DOA: 11/30/2021 PCP: Pcp, No  [id number]  LOS: 0 days   Brief hospital course: No notes on file  Assessment & Plan: Nathan Koch is a 31 y.o. male with medical history significant for Methamphetamine use who presented to the ED voluntarily reporting a relapse on meth as well as suicidal ideation.  He requested to see psychiatry.  He was noted in the ED to be ' tweaking' with picking of the skin and somewhat hyperactive and a work-up was done.   * Suicidal ideation Patient came in voluntarily Suicide precautions Behavioral health consulted --psych to follow  Rhabdomyolysis traumatic CK over 6000 --cont MIVF  Elevated LFTs Could be secondary to Rhabdo Had modestly elevated AST and ALT about 7 months ago in the 40s, but now elevated 144/55 down to 125/50 with hydration  Methamphetamine intoxication ([name]) Methamphetamine use disorder, severe Patient was reportedly "tweaking" skin picking and twitching and hyperactive in the emergency room Supportive care  AKI (acute kidney injury) ([name]) Resolved Creatinine was initially 1.63 and normalized with IV hydration in the ED Likely secondary to ATN from dehydration and rhabdo Continue IV hydration  Methamphetamine use disorder, severe ([name]) Per UDS    DVT prophylaxis: Lovenox SQ Code Status: Full code  Family Communication:  Level of care: Med-Surg Dispo:   The patient is from: home Anticipated d/c is to: undetermined   Subjective and Interval History:  No pain.  No complaints.   Objective: Vitals:   12/01/21 0500 12/01/21 0753 12/01/21 1055 12/01/21 1514  BP: 123/64 114/60 117/70 (!) 110/58  Pulse: 66 61 62 69  Resp: 18 18 18 18   Temp: 98.5 F (36.9 C) 98 F (36.7 C) 98.1 F (36.7 C) 98.1 F (36.7 C)  TempSrc: Oral Oral Oral Oral  SpO2: 99% 97% 99% 99%  Weight:      Height:        Intake/Output Summary (Last 24 hours) at 12/01/2021  1933 Last data filed at 12/01/2021 1805 Gross per 24 hour  Intake 3204.96 ml  Output --  Net 3204.96 ml   Filed Weights   11/30/21 1130  Weight: 102.1 kg    Examination:   Constitutional: NAD, AAOx3 HEENT: conjunctivae and lids normal, EOMI CV: No cyanosis.   RESP: normal respiratory effort, on RA Neuro: II - XII grossly intact.   Psych: Normal mood and affect.     Data Reviewed: I have personally reviewed labs and imaging studies  Time spent: 35 minutes  01/30/22, MD [facility] If 7PM-7AM, please contact night-coverage 12/01/2021, 7:33 PM

## 2021-12-01 NOTE — Progress Notes (Signed)
Patient arrived to room 2A 233 from ED.  Assessment complete, VS obtained, and Admission database began.

## 2021-12-01 NOTE — Consult Note (Signed)
[facility] Face-to-Face Psychiatry Consult   Reason for Consult: Suicidal ideation Referring Physician:  Fran Lowes Patient Identification: Nathan Koch MRN:  [id number] Principal Diagnosis: Suicidal ideation Diagnosis:  Principal Problem:   Suicidal ideation Active Problems:   Asthma   Methamphetamine use disorder, severe ([name])   Rhabdomyolysis   AKI (acute kidney injury) ([name])   Methamphetamine intoxication ([name])   Elevated LFTs   Total Time spent with patient: 45 minutes  Subjective:   Nathan Koch is a 31 y.o. male patient admitted with rhabdomyolysis.  HPI: Patient seen face-to-face.  He is alert and oriented x 3.  Sitter at bedside.  He does endorse suicidal thoughts, stating that he was picked up by police on the side of an overpass where he was thinking of jumping off.  Patient reports that he was in substance rehab and was clean for 5 months.  Patient reports he had an argument with his wife and she left the home.  He reports that after she left he "rehab" and used methamphetamine.  Patient identifies his 74-year-old daughter as a reason to live.  However, he states that his wife took the daughter with her and will not let him see her.  Patient states he gets his mental health outpatient treatment at [facility].  [place].  Patient admits to methamphetamine abuse.  He denies alcohol use or other illicit drugs.  Psychiatry will continue to follow patient to continue to assess his mood and suitability for inpatient psychiatry or substance rehab.  Past Psychiatric History: Methamphetamine abuse, bipolar disorder  Risk to Self:   Risk to Others:   Prior Inpatient Therapy:   Prior Outpatient Therapy:    Past Medical History:  Past Medical History:  Diagnosis Date   Anxiety    Anxiety disorder    Asthma    Bipolar 1 disorder ([name])    Depression    Methamphetamine abuse ([name])    PTSD (post-traumatic stress disorder)     Past Surgical History:  Procedure Laterality Date   CLOSED  REDUCTION MANDIBLE N/A 07/02/2018   Procedure: CLOSED REDUCTION MANDIBULAR;  Surgeon: Vernie Murders, MD;  Location: [facility] ORS;  Service: ENT;  Laterality: N/A;   Family History:  Family History  Problem Relation Age of Onset   Asthma Mother    Cancer Mother        breast cancer   Ulcers Mother    Cancer Father        esophogeal cancer   Hypertension Father    Asthma Brother    Family Psychiatric  History:  Social History:  Social History   Substance and Sexual Activity  Alcohol Use Not Currently   Alcohol/week: 13.0 standard drinks of alcohol   Types: 6 Cans of beer, 7 Standard drinks or equivalent per week   Comment: previously heavy drinker 2016. most recent 4 beer/ day drinker and none since 01-11-2020     Social History   Substance and Sexual Activity  Drug Use Yes   Types: Methamphetamines   Comment: states he uses 1 g every other day    Social History   Socioeconomic History   Marital status: Single    Spouse name: Not on file   Number of children: 1   Years of education: 10   Highest education level: 10th grade  Occupational History   Not on file  Tobacco Use   Smoking status: Every Day    Packs/day: 0.50    Years: 8.00    Total pack years:  4.00    Types: Cigarettes   Smokeless tobacco: Never  Vaping Use   Vaping Use: Every day   Substances: Nicotine, Flavoring  Substance and Sexual Activity   Alcohol use: Not Currently    Alcohol/week: 13.0 standard drinks of alcohol    Types: 6 Cans of beer, 7 Standard drinks or equivalent per week    Comment: previously heavy drinker 2016. most recent 4 beer/ day drinker and none since 01-11-2020   Drug use: Yes    Types: Methamphetamines    Comment: states he uses 1 g every other day   Sexual activity: Not on file  Other Topics Concern   Not on file  Social History Narrative   Not on file   Social Determinants of Health   Financial Resource Strain: Not on file  Food Insecurity: Not on file  Transportation  Needs: Not on file  Physical Activity: Not on file  Stress: Not on file  Social Connections: Not on file   Additional Social History:    Allergies:   Allergies  Allergen Reactions   Latex Hives    Labs:  Results for orders placed or performed during the hospital encounter of 11/30/21 (from the past 48 hour(s))  Comprehensive metabolic panel     Status: Abnormal   Collection Time: 11/30/21 11:37 AM  Result Value Ref Range   Sodium 136 135 - 145 mmol/L   Potassium 3.7 3.5 - 5.1 mmol/L   Chloride 102 98 - 111 mmol/L   CO2 20 (L) 22 - 32 mmol/L   Glucose, Bld 159 (H) 70 - 99 mg/dL    Comment: Glucose reference range applies only to samples taken after fasting for at least 8 hours.   BUN 34 (H) 6 - 20 mg/dL   Creatinine, Ser 1.61 (H) 0.61 - 1.24 mg/dL   Calcium 9.5 8.9 - 09.6 mg/dL   Total Protein 9.0 (H) 6.5 - 8.1 g/dL   Albumin 5.0 3.5 - 5.0 g/dL   AST 045 (H) 15 - 41 U/L   ALT 55 (H) 0 - 44 U/L   Alkaline Phosphatase 73 38 - 126 U/L   Total Bilirubin 2.9 (H) 0.3 - 1.2 mg/dL   GFR, Estimated 58 (L) >60 mL/min    Comment: (NOTE) Calculated using the CKD-EPI Creatinine Equation (2021)    Anion gap 14 5 - 15    Comment: Performed at [facility], [address] [place], [place] [address]  Ethanol     Status: None   Collection Time: 11/30/21 11:37 AM  Result Value Ref Range   Alcohol, Ethyl (B) <10 <10 mg/dL    Comment: (NOTE) Lowest detectable limit for serum alcohol is 10 mg/dL.  For medical purposes only. Performed at [facility], [address] [place], [place] [address]   Salicylate level     Status: Abnormal   Collection Time: 11/30/21 11:37 AM  Result Value Ref Range   Salicylate Lvl <7.0 (L) 7.0 - 30.0 mg/dL    Comment: Performed at [facility], [address] [place], [place] [address]  Acetaminophen level     Status: Abnormal   Collection Time: 11/30/21 11:37 AM  Result Value Ref Range   Acetaminophen (Tylenol), Serum  <10 (L) 10 - 30 ug/mL    Comment: (NOTE) Therapeutic concentrations vary significantly. A range of 10-30 ug/mL  may be an effective concentration for many patients. However, some  are best treated at concentrations outside of this range. Acetaminophen concentrations >150 ug/mL  at 4 hours after ingestion  and >50 ug/mL at 12 hours after ingestion are often associated with  toxic reactions.  Performed at [facility], [address] [place], [place] [address]   cbc     Status: Abnormal   Collection Time: 11/30/21 11:37 AM  Result Value Ref Range   WBC 14.7 (H) 4.0 - 10.5 K/uL   RBC 5.67 4.22 - 5.81 MIL/uL   Hemoglobin 15.6 13.0 - 17.0 g/dL   HCT 60.447.2 54.039.0 - 98.152.0 %   MCV 83.2 80.0 - 100.0 fL   MCH 27.5 26.0 - 34.0 pg   MCHC 33.1 30.0 - 36.0 g/dL   RDW 19.113.2 47.811.5 - 29.515.5 %   Platelets 432 (H) 150 - 400 K/uL   nRBC 0.0 0.0 - 0.2 %    Comment: Performed at [facility], [address] [place], [place] [address]  Urine Drug Screen, Qualitative     Status: Abnormal   Collection Time: 11/30/21 11:37 AM  Result Value Ref Range   Tricyclic, Ur Screen NONE DETECTED NONE DETECTED   Amphetamines, Ur Screen POSITIVE (A) NONE DETECTED   MDMA (Ecstasy)Ur Screen NONE DETECTED NONE DETECTED   Cocaine Metabolite,Ur [place] NONE DETECTED NONE DETECTED   Opiate, Ur Screen NONE DETECTED NONE DETECTED   Phencyclidine (PCP) Ur S NONE DETECTED NONE DETECTED   Cannabinoid 50 Ng, Ur [place] NONE DETECTED NONE DETECTED   Barbiturates, Ur Screen NONE DETECTED NONE DETECTED   Benzodiazepine, Ur Scrn NONE DETECTED NONE DETECTED   Methadone Scn, Ur NONE DETECTED NONE DETECTED    Comment: (NOTE) Tricyclics + metabolites, urine    Cutoff 1000 ng/mL Amphetamines + metabolites, urine  Cutoff 1000 ng/mL MDMA (Ecstasy), urine              Cutoff 500 ng/mL Cocaine Metabolite, urine          Cutoff 300 ng/mL Opiate + metabolites, urine        Cutoff 300 ng/mL Phencyclidine (PCP), urine         Cutoff  25 ng/mL Cannabinoid, urine                 Cutoff 50 ng/mL Barbiturates + metabolites, urine  Cutoff 200 ng/mL Benzodiazepine, urine              Cutoff 200 ng/mL Methadone, urine                   Cutoff 300 ng/mL  The urine drug screen provides only a preliminary, unconfirmed analytical test result and should not be used for non-medical purposes. Clinical consideration and professional judgment should be applied to any positive drug screen result due to possible interfering substances. A more specific alternate chemical method must be used in order to obtain a confirmed analytical result. Gas chromatography / mass spectrometry (GC/MS) is the preferred confirm atory method. Performed at [facility], [address] [place], [place] [address]   Hepatitis panel, acute     Status: None   Collection Time: 11/30/21 11:37 AM  Result Value Ref Range   Hepatitis B Surface Ag NON REACTIVE NON REACTIVE   HCV Ab NON REACTIVE NON REACTIVE    Comment: (NOTE) Nonreactive HCV antibody screen is consistent with no HCV infections,  unless recent infection is suspected or other evidence exists to indicate HCV infection.     Hep A IgM NON REACTIVE NON REACTIVE   Hep B C IgM NON REACTIVE NON REACTIVE  Comment: Performed at [facility], [address], [place], [place] [address]  CK     Status: Abnormal   Collection Time: 11/30/21  2:41 PM  Result Value Ref Range   Total CK 7,469 (H) 49 - 397 U/L    Comment: RESULT CONFIRMED BY MANUAL DILUTION.PMF Performed at [facility], [address] [place], [place] [address]   Basic metabolic panel     Status: Abnormal   Collection Time: 11/30/21  3:58 PM  Result Value Ref Range   Sodium 137 135 - 145 mmol/L   Potassium 3.7 3.5 - 5.1 mmol/L   Chloride 102 98 - 111 mmol/L   CO2 28 22 - 32 mmol/L   Glucose, Bld 116 (H) 70 - 99 mg/dL    Comment: Glucose reference range applies only to samples taken after fasting for at  least 8 hours.   BUN 26 (H) 6 - 20 mg/dL   Creatinine, Ser 2.95 0.61 - 1.24 mg/dL   Calcium 8.9 8.9 - 18.8 mg/dL   GFR, Estimated >41 >66 mL/min    Comment: (NOTE) Calculated using the CKD-EPI Creatinine Equation (2021)    Anion gap 7 5 - 15    Comment: Performed at [facility], [address] [place], [place] [address]  CK     Status: Abnormal   Collection Time: 11/30/21  6:19 PM  Result Value Ref Range   Total CK 6,602 (H) 49 - 397 U/L    Comment: RESULT CONFIRMED BY MANUAL DILUTION DLB Performed at [facility], [address] [place], [place] [address]   Hepatic function panel     Status: Abnormal   Collection Time: 11/30/21  6:19 PM  Result Value Ref Range   Total Protein 7.9 6.5 - 8.1 g/dL   Albumin 4.2 3.5 - 5.0 g/dL   AST 323 (H) 15 - 41 U/L   ALT 50 (H) 0 - 44 U/L   Alkaline Phosphatase 60 38 - 126 U/L   Total Bilirubin 2.5 (H) 0.3 - 1.2 mg/dL   Bilirubin, Direct 0.3 (H) 0.0 - 0.2 mg/dL   Indirect Bilirubin 2.2 (H) 0.3 - 0.9 mg/dL    Comment: Performed at [facility], [address] [place], [place] [address]  Comprehensive metabolic panel     Status: Abnormal   Collection Time: 12/01/21  3:13 AM  Result Value Ref Range   Sodium 136 135 - 145 mmol/L   Potassium 3.4 (L) 3.5 - 5.1 mmol/L   Chloride 107 98 - 111 mmol/L   CO2 24 22 - 32 mmol/L   Glucose, Bld 127 (H) 70 - 99 mg/dL    Comment: Glucose reference range applies only to samples taken after fasting for at least 8 hours.   BUN 18 6 - 20 mg/dL   Creatinine, Ser 2.02 0.61 - 1.24 mg/dL   Calcium 8.3 (L) 8.9 - 10.3 mg/dL   Total Protein 6.4 (L) 6.5 - 8.1 g/dL   Albumin 3.4 (L) 3.5 - 5.0 g/dL   AST 94 (H) 15 - 41 U/L   ALT 41 0 - 44 U/L   Alkaline Phosphatase 49 38 - 126 U/L   Total Bilirubin 1.9 (H) 0.3 - 1.2 mg/dL   GFR, Estimated >54 >27 mL/min    Comment: (NOTE) Calculated using the CKD-EPI Creatinine Equation (2021)    Anion gap 5 5 - 15    Comment: Performed at  [facility], [address], [place], [place] [address]  CK     Status: Abnormal   Collection Time: 12/01/21  3:13 AM  Result Value Ref Range   Total CK 3,500 (H) 49 - 397 U/L    Comment: Performed at [facility], [address] [place], [place] [address]  HIV Antibody (routine testing w rflx)     Status: None   Collection Time: 12/01/21  3:13 AM  Result Value Ref Range   HIV Screen 4th Generation wRfx Non Reactive Non Reactive    Comment: Performed at [facility], [address], [place], [place] [address]    Current Facility-Administered Medications  Medication Dose Route Frequency Provider Last Rate Last Admin   0.9 %  sodium chloride infusion   Intravenous Continuous Shaune Pollack, MD 150 mL/hr at 12/01/21 1639 New Bag at 12/01/21 1639   acetaminophen (TYLENOL) tablet 650 mg  650 mg Oral Q6H PRN Andris Baumann, MD       Or   acetaminophen (TYLENOL) suppository 650 mg  650 mg Rectal Q6H PRN Andris Baumann, MD       albuterol (PROVENTIL) (2.5 MG/3ML) 0.083% nebulizer solution 3 mL  3 mL Inhalation Q4H PRN Andris Baumann, MD       enoxaparin (LOVENOX) injection 50 mg  0.5 mg/kg Subcutaneous Q24H Andris Baumann, MD       hydrOXYzine (ATARAX) tablet 25 mg  25 mg Oral TID PRN Andris Baumann, MD       ondansetron [facility]) tablet 4 mg  4 mg Oral Q6H PRN Andris Baumann, MD       Or   ondansetron [facility]) injection 4 mg  4 mg Intravenous Q6H PRN Andris Baumann, MD       QUEtiapine (SEROQUEL) tablet 50 mg  50 mg Oral QHS Andris Baumann, MD   50 mg at 11/30/21 2126   sertraline (ZOLOFT) tablet 50 mg  50 mg Oral Daily Andris Baumann, MD   50 mg at 12/01/21 6578    Musculoskeletal: Strength & Muscle Tone: within normal limits Gait & Station: normal Patient leans: N/A            Psychiatric Specialty Exam:  Presentation  General Appearance: Appropriate for Environment  Eye Contact:Good  Speech:Clear and Coherent  Speech  Volume:Normal  Handedness:Right   Mood and Affect  Mood:Depressed  Affect:Congruent   Thought Process  Thought Processes:Coherent  Descriptions of Associations:Intact  Orientation:Full (Time, Place and Person)  Thought Content:Logical  History of Schizophrenia/Schizoaffective disorder:No  Duration of Psychotic Symptoms:N/A  Hallucinations:Hallucinations: None  Ideas of Reference:None  Suicidal Thoughts:Suicidal Thoughts: Yes, Passive SI Passive Intent and/or Plan: Without Plan  Homicidal Thoughts:Homicidal Thoughts: No   Sensorium  Memory:Immediate Good; Recent Good  Judgment:Poor  Insight:Fair   Executive Functions  Concentration:Good  Attention Span:Good  Recall:Good  Fund of Knowledge:Fair  Language:Fair   Psychomotor Activity  Psychomotor Activity:Psychomotor Activity: Normal  Assets  Assets:Desire for Improvement; Housing; Resilience   Sleep  Sleep:Sleep: Good  Physical Exam: Physical Exam Vitals and nursing note reviewed.  HENT:     Head: Normocephalic.  Eyes:     General:        Right eye: No discharge.        Left eye: No discharge.  Cardiovascular:     Rate and Rhythm: Normal rate.  Pulmonary:     Effort: Pulmonary effort is normal.  Musculoskeletal:        General: Normal range of motion.     Cervical  back: Normal range of motion.  Skin:    General: Skin is dry.  Neurological:     Mental Status: He is alert and oriented to person, place, and time.  Psychiatric:        Attention and Perception: Attention normal.        Mood and Affect: Mood normal.        Behavior: Behavior normal.        Thought Content: Thought content is not paranoid or delusional. Thought content does not include homicidal or suicidal ideation.    Review of Systems  Constitutional: Negative.   HENT: Negative.    Respiratory: Negative.    Cardiovascular: Negative.   Musculoskeletal: Negative.   Skin: Negative.   Psychiatric/Behavioral:   Positive for depression, substance abuse and suicidal ideas (passive). Negative for hallucinations and memory loss. The patient is not nervous/anxious and does not have insomnia.    Blood pressure (!) 110/58, pulse 69, temperature 98.1 F (36.7 C), temperature source Oral, resp. rate 18, height 5\' 9"  (1.753 m), weight 102.1 kg, SpO2 99 %. Body mass index is 33.23 kg/m.  Treatment Plan Summary: Plan follow-up patient while on the medical unit.  Continue to assess for suicidal ideation and depression.    Disposition:  Patient remains in medical unit at this time.  Will continue to follow while on medical unit and continue to reassess for need for psychiatric hospitalization.  Recommend that patient be discharged to rehab for substances, whether or not he is admitted to psychiatry first.  , NP 12/01/2021 6:58 PM

## 2021-12-01 NOTE — Assessment & Plan Note (Signed)
Per UDS

## 2021-12-02 DIAGNOSIS — R45851 Suicidal ideations: Secondary | ICD-10-CM | POA: Diagnosis not present

## 2021-12-02 LAB — BASIC METABOLIC PANEL
Anion gap: 3 — ABNORMAL LOW (ref 5–15)
BUN: 9 mg/dL (ref 6–20)
CO2: 24 mmol/L (ref 22–32)
Calcium: 7.8 mg/dL — ABNORMAL LOW (ref 8.9–10.3)
Chloride: 111 mmol/L (ref 98–111)
Creatinine, Ser: 0.65 mg/dL (ref 0.61–1.24)
GFR, Estimated: >60 mL/min (ref >60)
Glucose, Bld: 125 mg/dL — ABNORMAL HIGH (ref 70–99)
Potassium: 3.7 mmol/L (ref 3.5–5.1)
Sodium: 138 mmol/L (ref 135–145)

## 2021-12-02 LAB — CBC
HCT: 34.2 % — ABNORMAL LOW (ref 39.0–52.0)
Hemoglobin: 11.4 g/dL — ABNORMAL LOW (ref 13.0–17.0)
MCH: 28 pg (ref 26.0–34.0)
MCHC: 33.3 g/dL (ref 30.0–36.0)
MCV: 84 fL (ref 80.0–100.0)
Platelets: 246 10*3/uL (ref 150–400)
RBC: 4.07 MIL/uL — ABNORMAL LOW (ref 4.22–5.81)
RDW: 12.9 % (ref 11.5–15.5)
WBC: 5.8 10*3/uL (ref 4.0–10.5)
nRBC: 0 % (ref 0.0–0.2)

## 2021-12-02 LAB — MAGNESIUM: Magnesium: 2 mg/dL (ref 1.7–2.4)

## 2021-12-02 LAB — CK: Total CK: 1359 U/L — ABNORMAL HIGH (ref 49–397)

## 2021-12-02 NOTE — Progress Notes (Signed)
  PROGRESS NOTE    Nathan Koch  OVF:[id number] DOB: 09-10-1990 DOA: 11/30/2021 PCP: Pcp, No  211A/211A-AA  LOS: 1 day   Brief hospital course: No notes on file  Assessment & Plan: Nathan Koch is a 31 y.o. male with medical history significant for Methamphetamine use who presented to the ED voluntarily reporting a relapse on meth as well as suicidal ideation.  He requested to see psychiatry.  He was noted in the ED to be ' tweaking' with picking of the skin and somewhat hyperactive and a work-up was done.   * Suicidal ideation Patient came in voluntarily Suicide precautions Behavioral health consulted --psych to follow --d/c IVC today  Rhabdomyolysis traumatic CK 7469 on presentation, trended down to 1359 today Plan: --cont MIVF for 1 more day  Elevated LFTs Could be secondary to Rhabdo Had modestly elevated AST and ALT about 7 months ago in the 40s, but now elevated 144/55 down to 125/50 with hydration  Methamphetamine intoxication ([name]) Methamphetamine use disorder, severe Patient was reportedly "tweaking" skin picking and twitching and hyperactive in the emergency room Supportive care  AKI (acute kidney injury) ([name]) Resolved Creatinine was initially 1.63 and normalized with IV hydration in the ED Likely secondary to ATN from dehydration and rhabdo Continue IV hydration  Methamphetamine use disorder, severe ([name]) Per UDS    DVT prophylaxis: Lovenox SQ Code Status: Full code  Family Communication:  Level of care: Med-Surg Dispo:   The patient is from: home Anticipated d/c is to: tomorrow   Subjective and Interval History:  Pt had no complaint today.  Cr back to baseline, CK trending down.    Psych saw pt, and found pt no longer meets criteria for IVC, which was d/c'ed today.   Objective: Vitals:   12/01/21 2335 12/02/21 0340 12/02/21 0746 12/02/21 1509  BP: (!) 109/50 (!) 115/57 (!) 108/58 113/68  Pulse: 60 (!) 58 (!) 57 64  Resp: 20 18 20 18    Temp: 98.5 F (36.9 C) 98.5 F (36.9 C) 97.8 F (36.6 C) 98 F (36.7 C)  TempSrc:    Oral  SpO2: 99% 98% 99% 96%  Weight:      Height:        Intake/Output Summary (Last 24 hours) at 12/02/2021 1941 Last data filed at 12/02/2021 02/01/2022 Gross per 24 hour  Intake 4169.19 ml  Output --  Net 4169.19 ml   Filed Weights   11/30/21 1130  Weight: 102.1 kg    Examination:   Constitutional: NAD, AAOx3 HEENT: conjunctivae and lids normal, EOMI CV: No cyanosis.   RESP: normal respiratory effort, on RA Neuro: II - XII grossly intact.   Psych: subdued mood and affect.     Data Reviewed: I have personally reviewed labs and imaging studies  Time spent: 35 minutes  01/30/22, MD [facility] If 7PM-7AM, please contact night-coverage 12/02/2021, 7:41 PM

## 2021-12-02 NOTE — TOC Initial Note (Signed)
Transition of Care [facility]) - Initial/Assessment Note    Patient Details  Name: Nathan Koch MRN: [id number] Date of Birth: 07-21-1990  Transition of [facility]) CM/SW Contact:    Chapman Fitch, RN Phone Number: 12/02/2021, 2:01 PM  Clinical Narrative:                  Spoke with patient at bedside  He states that he graduated from from the program at [facility] on Friday. [phone] Patient states that he would like to return but he believes they will require him to complete detox first.   I have left a message at [facility]  Provided patient with list of SA resources.  He is aware that it is his responsibility to find a detox center that can accept him.  Patient to start calling now  Will need cab voucher at discharge         Patient Goals and CMS Choice        Expected Discharge Plan and Services                                                Prior Living Arrangements/Services                       Activities of Daily Living Home Assistive Devices/Equipment: None ADL Screening (condition at time of admission) Patient's cognitive ability adequate to safely complete daily activities?: Yes Is the patient deaf or have difficulty hearing?: No Does the patient have difficulty seeing, even when wearing glasses/contacts?: No Does the patient have difficulty concentrating, remembering, or making decisions?: No Patient able to express need for assistance with ADLs?: Yes Does the patient have difficulty dressing or bathing?: No Independently performs ADLs?: Yes (appropriate for developmental age) Does the patient have difficulty walking or climbing stairs?: No Weakness of Legs: None Weakness of Arms/Hands: None  Permission Sought/Granted                  Emotional Assessment              Admission diagnosis:  Rhabdomyolysis [M62.82] Substance abuse ([name]) [F19.10] AKI (acute kidney injury) ([name]) [N17.9] Abnormal LFTs  [R79.89] Patient Active Problem List   Diagnosis Date Noted   Rhabdomyolysis 11/30/2021   AKI (acute kidney injury) ([name]) 11/30/2021   Methamphetamine intoxication ([name]) 11/30/2021   Elevated LFTs 11/30/2021   Passive suicidal ideations 04/16/2021   Homelessness 04/16/2021   Substance induced mood disorder ([name]) 04/16/2021   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   Severe recurrent major depression without psychotic features ([name]) 01/16/2021   Bipolar 1 disorder, depressed ([name])    MDD (major depressive disorder), recurrent episode, severe ([name]) 09/11/2020   MDD (major depressive disorder), recurrent severe, without psychosis ([name]) 09/10/2020   Suicidal ideation    Depression, major, recurrent, 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   Methamphetamine use disorder, severe ([name]) 09/14/2019   MDD (major depressive disorder), recurrent, severe, with psychosis ([name]) 09/14/2019   MDD (major depressive disorder), single episode, severe with psychosis ([name]) 09/14/2019   Tobacco use disorder 02/21/2019   PTSD (post-traumatic stress disorder) 02/21/2019   Benzodiazepine abuse ([name]) 11/15/2017   Asthma 06/26/2014   PCP:  Pcp, No Pharmacy:   [id number] - [place], [place] - [address] [place]  [place] [address] Phone: [phone] Fax: [phone]     Social Determinants of Health (SDOH) Interventions    Readmission Risk Interventions     No data to display

## 2021-12-02 NOTE — Consult Note (Signed)
[facility] Face-to-Face Psychiatry Consult   Reason for Consult:  follow-up SI Referring Physician:  Fran Lowes Patient Identification: Nathan Koch MRN:  [id number] Principal Diagnosis: Suicidal ideation Diagnosis:  Principal Problem:   Suicidal ideation Active Problems:   Asthma   Methamphetamine use disorder, severe ([name])   Rhabdomyolysis   AKI (acute kidney injury) ([name])   Methamphetamine intoxication ([name])   Elevated LFTs   Total Time spent with patient: 30 minutes  Subjective:   Nathan Koch is a 31 y.o. male patient admitted with rhabdomyolysis.  HPI: See note of 12/01/2021. Patient re-assessed this afternoon.  Patient is sitting up in bed with a sitter at bedside.  He is smiling appropriately.  He states that he no longer has any suicidal thoughts.  Denies any plan or intent of suicide.  He states that he had the social worker contact "[facility]" substance rehab and he is hopeful that he will be able to go back there.  Patient states that he will need to detox first, and he believes that he will be able to do that as inpatient here in the hospital on medical unit. Discussed with patient there really is no "detox" from meth, which he understands.   Patient continues to deny any auditory or visual hallucinations. Speaks in clear, coherent sentences with linear speech.  Objectively, no evidence evidence of paranoia, psychosis, or mania. Patient no longer meets criteria for involuntary commitment. At Dr. Tama Gander request, writer released IVC.   Past Psychiatric History: SEE PREVIOUS  Risk to Self:   Risk to Others:   Prior Inpatient Therapy:   Prior Outpatient Therapy:    Past Medical History:  Past Medical History:  Diagnosis Date   Anxiety    Anxiety disorder    Asthma    Bipolar 1 disorder ([name])    Depression    Methamphetamine abuse ([name])    PTSD (post-traumatic stress disorder)     Past Surgical History:  Procedure Laterality Date   CLOSED REDUCTION MANDIBLE N/A 07/02/2018    Procedure: CLOSED REDUCTION MANDIBULAR;  Surgeon: Vernie Murders, MD;  Location: [facility] ORS;  Service: ENT;  Laterality: N/A;   Family History:  Family History  Problem Relation Age of Onset   Asthma Mother    Cancer Mother        breast cancer   Ulcers Mother    Cancer Father        esophogeal cancer   Hypertension Father    Asthma Brother    Family Psychiatric  History:  Social History:  Social History   Substance and Sexual Activity  Alcohol Use Not Currently   Alcohol/week: 13.0 standard drinks of alcohol   Types: 6 Cans of beer, 7 Standard drinks or equivalent per week   Comment: previously heavy drinker 2016. most recent 4 beer/ day drinker and none since 01-11-2020     Social History   Substance and Sexual Activity  Drug Use Yes   Types: Methamphetamines   Comment: states he uses 1 g every other day    Social History   Socioeconomic History   Marital status: Single    Spouse name: Not on file   Number of children: 1   Years of education: 10   Highest education level: 10th grade  Occupational History   Not on file  Tobacco Use   Smoking status: Every Day    Packs/day: 0.50    Years: 8.00    Total pack years: 4.00    Types: Cigarettes  Smokeless tobacco: Never  Vaping Use   Vaping Use: Every day   Substances: Nicotine, Flavoring  Substance and Sexual Activity   Alcohol use: Not Currently    Alcohol/week: 13.0 standard drinks of alcohol    Types: 6 Cans of beer, 7 Standard drinks or equivalent per week    Comment: previously heavy drinker 2016. most recent 4 beer/ day drinker and none since 01-11-2020   Drug use: Yes    Types: Methamphetamines    Comment: states he uses 1 g every other day   Sexual activity: Not on file  Other Topics Concern   Not on file  Social History Narrative   Not on file   Social Determinants of Health   Financial Resource Strain: Not on file  Food Insecurity: Not on file  Transportation Needs: Not on file  Physical  Activity: Not on file  Stress: Not on file  Social Connections: Not on file   Additional Social History:    Allergies:   Allergies  Allergen Reactions   Latex Hives    Labs:  Results for orders placed or performed during the hospital encounter of 11/30/21 (from the past 48 hour(s))  CK     Status: Abnormal   Collection Time: 11/30/21  2:41 PM  Result Value Ref Range   Total CK 7,469 (H) 49 - 397 U/L    Comment: RESULT CONFIRMED BY MANUAL DILUTION.PMF Performed at [facility], [address] [place], [place] [address]   Basic metabolic panel     Status: Abnormal   Collection Time: 11/30/21  3:58 PM  Result Value Ref Range   Sodium 137 135 - 145 mmol/L   Potassium 3.7 3.5 - 5.1 mmol/L   Chloride 102 98 - 111 mmol/L   CO2 28 22 - 32 mmol/L   Glucose, Bld 116 (H) 70 - 99 mg/dL    Comment: Glucose reference range applies only to samples taken after fasting for at least 8 hours.   BUN 26 (H) 6 - 20 mg/dL   Creatinine, Ser 2.54 0.61 - 1.24 mg/dL   Calcium 8.9 8.9 - 27.0 mg/dL   GFR, Estimated >62 >37 mL/min    Comment: (NOTE) Calculated using the CKD-EPI Creatinine Equation (2021)    Anion gap 7 5 - 15    Comment: Performed at [facility], [address] [place], [place] [address]  CK     Status: Abnormal   Collection Time: 11/30/21  6:19 PM  Result Value Ref Range   Total CK 6,602 (H) 49 - 397 U/L    Comment: RESULT CONFIRMED BY MANUAL DILUTION DLB Performed at [facility], [address] [place], [place] [address]   Hepatic function panel     Status: Abnormal   Collection Time: 11/30/21  6:19 PM  Result Value Ref Range   Total Protein 7.9 6.5 - 8.1 g/dL   Albumin 4.2 3.5 - 5.0 g/dL   AST 607 (H) 15 - 41 U/L   ALT 50 (H) 0 - 44 U/L   Alkaline Phosphatase 60 38 - 126 U/L   Total Bilirubin 2.5 (H) 0.3 - 1.2 mg/dL   Bilirubin, Direct 0.3 (H) 0.0 - 0.2 mg/dL   Indirect Bilirubin 2.2 (H) 0.3 - 0.9 mg/dL    Comment: Performed at  [facility], [address] [place], [place] [address]  Comprehensive metabolic panel     Status: Abnormal   Collection Time: 12/01/21  3:13 AM  Result Value Ref Range  Sodium 136 135 - 145 mmol/L   Potassium 3.4 (L) 3.5 - 5.1 mmol/L   Chloride 107 98 - 111 mmol/L   CO2 24 22 - 32 mmol/L   Glucose, Bld 127 (H) 70 - 99 mg/dL    Comment: Glucose reference range applies only to samples taken after fasting for at least 8 hours.   BUN 18 6 - 20 mg/dL   Creatinine, Ser 4.81 0.61 - 1.24 mg/dL   Calcium 8.3 (L) 8.9 - 10.3 mg/dL   Total Protein 6.4 (L) 6.5 - 8.1 g/dL   Albumin 3.4 (L) 3.5 - 5.0 g/dL   AST 94 (H) 15 - 41 U/L   ALT 41 0 - 44 U/L   Alkaline Phosphatase 49 38 - 126 U/L   Total Bilirubin 1.9 (H) 0.3 - 1.2 mg/dL   GFR, Estimated >85 >63 mL/min    Comment: (NOTE) Calculated using the CKD-EPI Creatinine Equation (2021)    Anion gap 5 5 - 15    Comment: Performed at [facility], [address] [place], [place] [address]  CK     Status: Abnormal   Collection Time: 12/01/21  3:13 AM  Result Value Ref Range   Total CK 3,500 (H) 49 - 397 U/L    Comment: Performed at [facility], [address] [place], [place] [address]  HIV Antibody (routine testing w rflx)     Status: None   Collection Time: 12/01/21  3:13 AM  Result Value Ref Range   HIV Screen 4th Generation wRfx Non Reactive Non Reactive    Comment: Performed at [facility], [address] [place], [place] [address]  Basic metabolic panel     Status: Abnormal   Collection Time: 12/02/21  4:42 AM  Result Value Ref Range   Sodium 138 135 - 145 mmol/L   Potassium 3.7 3.5 - 5.1 mmol/L   Chloride 111 98 - 111 mmol/L   CO2 24 22 - 32 mmol/L   Glucose, Bld 125 (H) 70 - 99 mg/dL    Comment: Glucose reference range applies only to samples taken after fasting for at least 8 hours.   BUN 9 6 - 20 mg/dL   Creatinine, Ser 2.77 0.61 - 1.24 mg/dL   Calcium 7.8 (L) 8.9 - 10.3 mg/dL   GFR,  Estimated >41 >28 mL/min    Comment: (NOTE) Calculated using the CKD-EPI Creatinine Equation (2021)    Anion gap 3 (L) 5 - 15    Comment: Performed at [facility], [address] [place], [place] [address]  CBC     Status: Abnormal   Collection Time: 12/02/21  4:42 AM  Result Value Ref Range   WBC 5.8 4.0 - 10.5 K/uL   RBC 4.07 (L) 4.22 - 5.81 MIL/uL   Hemoglobin 11.4 (L) 13.0 - 17.0 g/dL    Comment: REPEATED TO VERIFY   HCT 34.2 (L) 39.0 - 52.0 %   MCV 84.0 80.0 - 100.0 fL   MCH 28.0 26.0 - 34.0 pg   MCHC 33.3 30.0 - 36.0 g/dL   RDW 72.0 94.7 - 09.6 %   Platelets 246 150 - 400 K/uL   nRBC 0.0 0.0 - 0.2 %    Comment: Performed at [facility], [address] [place], [place] [address]  Magnesium     Status: None   Collection Time: 12/02/21  4:42 AM  Result Value Ref Range   Magnesium 2.0 1.7 - 2.4 mg/dL  Comment: Performed at [facility], [address] [place], [place] [address]  CK     Status: Abnormal   Collection Time: 12/02/21  4:42 AM  Result Value Ref Range   Total CK 1,359 (H) 49 - 397 U/L    Comment: Performed at [facility], [address] [place], [place] [address]    Current Facility-Administered Medications  Medication Dose Route Frequency Provider Last Rate Last Admin   0.9 %  sodium chloride infusion   Intravenous Continuous Shaune Pollack, MD 150 mL/hr at 12/02/21 1323 New Bag at 12/02/21 1323   acetaminophen (TYLENOL) tablet 650 mg  650 mg Oral Q6H PRN Andris Baumann, MD       Or   acetaminophen (TYLENOL) suppository 650 mg  650 mg Rectal Q6H PRN Andris Baumann, MD       albuterol (PROVENTIL) (2.5 MG/3ML) 0.083% nebulizer solution 3 mL  3 mL Inhalation Q4H PRN Andris Baumann, MD   3 mL at 12/01/21 2212   enoxaparin (LOVENOX) injection 50 mg  0.5 mg/kg Subcutaneous Q24H Andris Baumann, MD       hydrOXYzine (ATARAX) tablet 25 mg  25 mg Oral TID PRN Andris Baumann, MD       ondansetron [facility]) tablet 4 mg   4 mg Oral Q6H PRN Andris Baumann, MD       Or   ondansetron [facility]) injection 4 mg  4 mg Intravenous Q6H PRN Andris Baumann, MD       QUEtiapine (SEROQUEL) tablet 50 mg  50 mg Oral QHS Andris Baumann, MD   50 mg at 12/01/21 2118   sertraline (ZOLOFT) tablet 50 mg  50 mg Oral Daily Andris Baumann, MD   50 mg at 12/02/21 1043    Musculoskeletal: Strength & Muscle Tone: within normal limits Gait & Station:  did not observe Patient leans: N/A   Psychiatric Specialty Exam:  Presentation  General Appearance: Appropriate for Environment  Eye Contact:Good  Speech:Clear and Coherent  Speech Volume:Normal  Handedness:Right   Mood and Affect  Mood:Euthymic  Affect:Congruent   Thought Process  Thought Processes:Coherent  Descriptions of Associations:Intact  Orientation:Full (Time, Place and Person)  Thought Content:Logical  History of Schizophrenia/Schizoaffective disorder:No  Duration of Psychotic Symptoms:N/A  Hallucinations:Hallucinations: None  Ideas of Reference:None  Suicidal Thoughts:Suicidal Thoughts: No SI Passive Intent and/or Plan: Without Plan  Homicidal Thoughts:Homicidal Thoughts: No   Sensorium  Memory:Immediate Good; Recent Good  Judgment:Fair  Insight:Fair   Executive Functions  Concentration:Fair  Attention Span:Good  Recall:Good  Fund of Knowledge:Fair  Language:Fair   Psychomotor Activity  Psychomotor Activity:Psychomotor Activity: Normal   Assets  Assets:Desire for Improvement; Resilience; Physical Health   Sleep  Sleep:Sleep: Good   Physical Exam: Physical Exam Vitals and nursing note reviewed.  HENT:     Head: Normocephalic.     Nose: No congestion or rhinorrhea.  Eyes:     General:        Right eye: No discharge.        Left eye: No discharge.  Pulmonary:     Effort: Pulmonary effort is normal.  Musculoskeletal:     Cervical back: Normal range of motion.  Neurological:     Mental Status: He is  alert and oriented to person, place, and time.  Psychiatric:        Attention and Perception: Attention normal.        Mood and Affect: Mood normal.  Behavior: Behavior normal. Behavior is cooperative.        Thought Content: Thought content is not paranoid or delusional. Thought content does not include homicidal or suicidal ideation.        Cognition and Memory: Cognition normal.        Judgment: Judgment normal.    Review of Systems  HENT: Negative.    Respiratory: Negative.    Cardiovascular: Negative.   Musculoskeletal: Negative.   Psychiatric/Behavioral:  Positive for depression (STABLE) and substance abuse. Negative for hallucinations, memory loss and suicidal ideas. The patient is not nervous/anxious and does not have insomnia.    Blood pressure (!) 108/58, pulse (!) 57, temperature 97.8 F (36.6 C), resp. rate 20, height 5\' 9"  (1.753 m), weight 102.1 kg, SpO2 99 %. Body mass index is 33.23 kg/m.  Treatment Plan Summary: Plan Patient denies suicidal or homicidal ideation. Denies auditory  or visual hallucinations. Does not appear to be responding to internal stimuli. Is lucid speaks in clear, linear sentences. Patient does not meet criteria for involuntary commitment. Reviewed with Dr. Fran LowesLai via secure chat.   Disposition: No evidence of imminent risk to self or others at present.   Patient does not meet criteria for psychiatric inpatient admission. Supportive therapy provided about ongoing stressors. Discussed crisis plan, support from social network, calling 911, coming to the Emergency Department, and calling Suicide Hotline.  Vanetta MuldersLouise F Ridhima Golberg, NP 12/02/2021 1:53 PM

## 2021-12-02 NOTE — BH Assessment (Signed)
IVC PAPERS  RESCINDED  PER  LOUISE  NP 

## 2021-12-03 ENCOUNTER — Other Ambulatory Visit: Payer: Self-pay

## 2021-12-03 LAB — CBC
HCT: 35.5 % — ABNORMAL LOW (ref 39.0–52.0)
Hemoglobin: 11.9 g/dL — ABNORMAL LOW (ref 13.0–17.0)
MCH: 28.3 pg (ref 26.0–34.0)
MCHC: 33.5 g/dL (ref 30.0–36.0)
MCV: 84.5 fL (ref 80.0–100.0)
Platelets: 262 10*3/uL (ref 150–400)
RBC: 4.2 MIL/uL — ABNORMAL LOW (ref 4.22–5.81)
RDW: 12.9 % (ref 11.5–15.5)
WBC: 6.2 10*3/uL (ref 4.0–10.5)
nRBC: 0 % (ref 0.0–0.2)

## 2021-12-03 LAB — BASIC METABOLIC PANEL
Anion gap: 2 — ABNORMAL LOW (ref 5–15)
Anion gap: 6 (ref 5–15)
Anion gap: 7 (ref 5–15)
BUN: 7 mg/dL (ref 6–20)
BUN: 7 mg/dL (ref 6–20)
BUN: 7 mg/dL (ref 6–20)
CO2: 26 mmol/L (ref 22–32)
CO2: 26 mmol/L (ref 22–32)
CO2: 26 mmol/L (ref 22–32)
Calcium: 8.3 mg/dL — ABNORMAL LOW (ref 8.9–10.3)
Calcium: 8.8 mg/dL — ABNORMAL LOW (ref 8.9–10.3)
Calcium: 8.3 mg/dL — ABNORMAL LOW (ref 8.9–10.3)
Chloride: 114 mmol/L — ABNORMAL HIGH (ref 98–111)
Chloride: 111 mmol/L (ref 98–111)
Chloride: 107 mmol/L (ref 98–111)
Creatinine, Ser: 0.62 mg/dL (ref 0.61–1.24)
Creatinine, Ser: 0.71 mg/dL (ref 0.61–1.24)
Creatinine, Ser: 0.8 mg/dL (ref 0.61–1.24)
GFR, Estimated: >60 mL/min (ref >60)
GFR, Estimated: >60 mL/min (ref >60)
GFR, Estimated: >60 mL/min (ref >60)
Glucose, Bld: 117 mg/dL — ABNORMAL HIGH (ref 70–99)
Glucose, Bld: 91 mg/dL (ref 70–99)
Glucose, Bld: 210 mg/dL — ABNORMAL HIGH (ref 70–99)
Potassium: 3.7 mmol/L (ref 3.5–5.1)
Potassium: 3.9 mmol/L (ref 3.5–5.1)
Potassium: 3.5 mmol/L (ref 3.5–5.1)
Sodium: 142 mmol/L (ref 135–145)
Sodium: 143 mmol/L (ref 135–145)
Sodium: 140 mmol/L (ref 135–145)

## 2021-12-03 LAB — MAGNESIUM
Magnesium: 2 mg/dL (ref 1.7–2.4)
Magnesium: 2.1 mg/dL (ref 1.7–2.4)
Magnesium: 1.9 mg/dL (ref 1.7–2.4)

## 2021-12-03 LAB — CK: Total CK: 587 U/L — ABNORMAL HIGH (ref 49–397)

## 2021-12-03 MED ORDER — ALBUTEROL SULFATE HFA 108 (90 BASE) MCG/ACT IN AERS
1.0000 | INHALATION_SPRAY | RESPIRATORY_TRACT | 0 refills | Status: DC | PRN
  Filled 2021-12-03: qty 6.7, 30d supply, fill #0

## 2021-12-03 MED ORDER — QUETIAPINE FUMARATE 100 MG PO TABS
50.0000 mg | ORAL_TABLET | Freq: Every day | ORAL | 0 refills | Status: DC
  Filled 2021-12-03: qty 30, 30d supply, fill #0

## 2021-12-03 MED ORDER — HYDROXYZINE HCL 25 MG PO TABS
25.0000 mg | ORAL_TABLET | Freq: Three times a day (TID) | ORAL | 0 refills | Status: DC | PRN
  Filled 2021-12-03: qty 90, 30d supply, fill #0

## 2021-12-03 MED ORDER — NICOTINE 14 MG/24HR TD PT24
14.0000 mg | MEDICATED_PATCH | Freq: Every day | TRANSDERMAL | 0 refills | Status: DC
  Filled 2021-12-03: qty 14, 14d supply, fill #0

## 2021-12-03 MED ORDER — SERTRALINE HCL 50 MG PO TABS
50.0000 mg | ORAL_TABLET | Freq: Every day | ORAL | 0 refills | Status: DC
Start: 2021-12-03 — End: 2021-12-11
  Filled 2021-12-03: qty 30, 30d supply, fill #0

## 2021-12-03 NOTE — Progress Notes (Addendum)
Per poison control, recommendation is to monitor patient's cardiac rhythm/heart rate, perform EKG and check electrolytes, specifically potassium and magnesium. Poison control advised monitoring for next 6 hours. Poison control will call back to check on patient at a later time.   [name], RN

## 2021-12-03 NOTE — Consult Note (Signed)
Follow-up consult 31 year old man history of substance abuse and mood symptoms.  He had previously declared himself to be not having any suicidal ideation and was in agreement to an outpatient discharge plan.  Apparently when he was told that no bed was available at substance abuse facility he impulsively swallowed his supply of medicine most likely Seroquel.  Came to see patient this afternoon found him sedated speech slurred not able to hold a reasonable conversation.  Dangerous behavior impulsively.  Probably will be reviewed for a few hours.  We will plan to admit him to the psychiatric service most likely tomorrow.

## 2021-12-03 NOTE — Hospital Course (Addendum)
Nathan Koch is a 31 y.o. male with medical history significant for Methamphetamine use who presented to the ED 11/30/2021 voluntarily reporting a relapse on meth as well as suicidal ideation.  He requested to see psychiatry.  He was noted in the ED to be ' tweaking' with picking of the skin and somewhat hyperactive and a work-up was done. 08/06: Tachycardic to 112, VS WNL otherwise, WBC 14.7, Cr 1.63, AST 144 ALT 55, repeat BMP later showed normalization of creatinine to 1.16.  UDS positive for amphetamines.  CK 6602, started on IV fluids.  Admitted to hospitalist service for rhabdomyolysis and medical clearance pending further evaluation by psychiatry.  Initially was recommended for inpatient psychiatric hospitalization 08/07: LFT trending down.  AKI resolved.  Formal psychiatry assessment, advised to continue to follow 08/08: Follow-up psychiatry assessment, no evidence at this time for paranoia, psychosis, mania, no longer meeting criteria for involuntary commitment or inpatient psychiatric admission and IVC was released/discontinued. 08/09: CK down to 587 and Cr stable, appropriate for discharge today which was confirmed discharge plan and medications w/ psychiatry  - however pt took 15 Seroquel tabs and d/c was held. Poison control contracted, labs/EKG were trended. Psychiatry team made aware. Once medically stable plan to admit to inpatient psychiatric unit.  08/10: nonspecific abn EKG overnight, repeat this AM same -ask cardiologist on-call to review, confirmed no concerns from Seroquel dosage and changes are nonspecific.  Advised remain on telemetry total 24 hours since ingestion and if no events should be clear for discharge.  Psychiatry planning on inpatient psychiatric admission later this evening

## 2021-12-03 NOTE — Discharge Summary (Signed)
Physician Discharge Summary   Patient: Nathan Koch MRN: [id number]  DOB: [birth year]   Admit:     Date of Admission: 11/30/2021 Admitted from: home   Discharge: Date of discharge: 12/03/21 Disposition: Home Condition at discharge: good  CODE STATUS: FULL      Discharge Physician: Sunnie Nielsen, DO [facility]     PCP: Pcp, No  Recommendations for Outpatient Follow-up:  Follow up with PCP Pcp, No in 1-2 weeks Please obtain labs/tests: CK, BMP in 1-2 weeks Please follow up on the following pending results: none Please follow w/ psychiatry as directed    Hospital Course: Nathan Koch is a 31 y.o. male with medical history significant for Methamphetamine use who presented to the ED 11/30/2021 voluntarily reporting a relapse on meth as well as suicidal ideation.  He requested to see psychiatry.  He was noted in the ED to be ' tweaking' with picking of the skin and somewhat hyperactive and a work-up was done. 08/06: Tachycardic to 112, VS WNL otherwise, WBC 14.7, Cr 1.63, AST 144 ALT 55, repeat BMP later showed normalization of creatinine to 1.16.  UDS positive for amphetamines.  CK 6602, started on IV fluids.  Admitted to hospitalist service for rhabdomyolysis and medical clearance pending further evaluation by psychiatry.  Initially was recommended for inpatient psychiatric hospitalization 08/07: LFT trending down.  AKI resolved.  Formal psychiatry assessment, advised to continue to follow 08/08: Follow-up psychiatry assessment, no evidence at this time for paranoia, psychosis, mania, no longer meeting criteria for involuntary commitment or inpatient psychiatric admission and IVC was released/discontinued. 08/09: CK down to 587 and Cr stable, appropriate for discharge today which was confirmed discharge plan and medications w/ psychiatry      Consultants:  Psychiatry  Procedures:  None       Discharge Diagnoses: Principal Problem:   Suicidal  ideation Active Problems:   Rhabdomyolysis   Methamphetamine use disorder, severe ([name])   AKI (acute kidney injury) ([name])   Methamphetamine intoxication ([name])   Elevated LFTs   Asthma    Assessment & Plan:  Methamphetamine intoxication ([name]) Methamphetamine use disorder, severe Patient was reportedly "tweaking" skin picking and twitching and hyperactive in the emergency room Improved/resolved over course of hospitalization Supportive care Seen and cleared by psychiatry Encouraged abstinence from recreational drugs  Suicidal ideation Patient came in voluntarily Psychiatry evaluated patient, IVC was discontinued and patient was not meeting criteria for inpatient admission  suicidal ideation resolved  AKI (acute kidney injury) ([name]) Resolved Likely secondary to ATN from dehydration and rhabdo  Rhabdomyolysis K trending down traumatic CK 7000s on presentation, trended down to 500s today  Elevated LFTs Trending downward likely secondary to Rhabdo Had modestly elevated AST and ALT about 7 months ago in the 40s, but now elevated 144/55 down to 125/50 with hydration  Methamphetamine use disorder, severe ([name]) Per UDS See above       Discharge Instructions  Allergies as of 12/03/2021       Reactions   Latex Hives        Medication List     STOP taking these medications    busPIRone 5 MG tablet Commonly known as: BUSPAR   Cepacol Regular Strength 3 MG lozenge Generic drug: menthol-cetylpyridinium   hydrOXYzine 25 MG capsule Commonly known as: Vistaril   menthol-cetylpyridinium 3 MG lozenge Commonly known as: CEPACOL       TAKE these medications    albuterol 108 (90 Base) MCG/ACT inhaler Commonly known as:  VENTOLIN HFA Inhale 1-2 puffs into the lungs every 4 (four) hours as needed for wheezing or shortness of breath. What changed:  how much to take Another medication with the same name was removed. Continue taking this medication, and follow the  directions you see here.   hydrOXYzine 25 MG tablet Commonly known as: ATARAX Take 1 tablet (25 mg total) by mouth 3 (three) times daily as needed for anxiety. What changed: Another medication with the same name was removed. Continue taking this medication, and follow the directions you see here.   nicotine 14 mg/24hr patch Commonly known as: NICODERM CQ - dosed in mg/24 hours Place 1 patch (14 mg total) onto the skin daily. What changed: Another medication with the same name was removed. Continue taking this medication, and follow the directions you see here.   QUEtiapine 100 MG tablet Commonly known as: SEROquel Take 0.5-1 tablets (50-100 mg total) by mouth at bedtime. What changed:  medication strength how much to take Another medication with the same name was removed. Continue taking this medication, and follow the directions you see here.   sertraline 50 MG tablet Commonly known as: ZOLOFT Take 1 tablet (50 mg total) by mouth at bedtime. What changed: Another medication with the same name was removed. Continue taking this medication, and follow the directions you see here.          Allergies  Allergen Reactions   Latex Hives     Subjective: pt denies pain, no CP/SOB, no HA/VC, he is eating/drinking normally, no SI at this time (he reports he is frustrated that he will not be admitted for psychiatry inpatient here)   Discharge Exam: BP 107/61 (BP Location: Left Arm)   Pulse (!) 56   Temp 98.4 F (36.9 C) (Oral)   Resp 16   Ht 5\' 9"  (1.753 m)   Wt 102.1 kg   SpO2 98%   BMI 33.23 kg/m  General: Pt is alert, awake, not in acute distress Cardiovascular: RRR, S1/S2 +, no rubs, no gallops Respiratory: CTA bilaterally, no wheezing, no rhonchi Abdominal: Soft, NT, ND, bowel sounds + Extremities: no edema, no cyanosis     The results of significant diagnostics from this hospitalization (including imaging, microbiology, ancillary and laboratory) are listed below  for reference.     Microbiology: No results found for this or any previous visit (from the past 240 hour(s)).   Labs: BNP (last 3 results) No results for input(s): "BNP" in the last 8760 hours. Basic Metabolic Panel: Recent Labs  Lab 11/30/21 1137 11/30/21 1558 12/01/21 0313 12/02/21 0442 12/03/21 0448  NA 136 137 136 138 142  K 3.7 3.7 3.4* 3.7 3.7  CL 102 102 107 111 114*  CO2 20* 28 24 24 26   GLUCOSE 159* 116* 127* 125* 117*  BUN 34* 26* 18 9 7   CREATININE 1.63* 1.16 0.79 0.65 0.62  CALCIUM 9.5 8.9 8.3* 7.8* 8.3*  MG  --   --   --  2.0 2.0   Liver Function Tests: Recent Labs  Lab 11/30/21 1137 11/30/21 1819 12/01/21 0313  AST 144* 125* 94*  ALT 55* 50* 41  ALKPHOS 73 60 49  BILITOT 2.9* 2.5* 1.9*  PROT 9.0* 7.9 6.4*  ALBUMIN 5.0 4.2 3.4*   No results for input(s): "LIPASE", "AMYLASE" in the last 168 hours. No results for input(s): "AMMONIA" in the last 168 hours. CBC: Recent Labs  Lab 11/30/21 1137 12/02/21 0442 12/03/21 0448  WBC 14.7* 5.8 6.2  HGB  15.6 11.4* 11.9*  HCT 47.2 34.2* 35.5*  MCV 83.2 84.0 84.5  PLT 432* 246 262   Cardiac Enzymes: Recent Labs  Lab 11/30/21 1441 11/30/21 1819 12/01/21 0313 12/02/21 0442 12/03/21 0448  CKTOTAL 7,469* 6,602* 3,500* 1,359* 587*   BNP: Invalid input(s): "POCBNP" CBG: No results for input(s): "GLUCAP" in the last 168 hours. D-Dimer No results for input(s): "DDIMER" in the last 72 hours. Hgb A1c No results for input(s): "HGBA1C" in the last 72 hours. Lipid Profile No results for input(s): "CHOL", "HDL", "LDLCALC", "TRIG", "CHOLHDL", "LDLDIRECT" in the last 72 hours. Thyroid function studies No results for input(s): "TSH", "T4TOTAL", "T3FREE", "THYROIDAB" in the last 72 hours.  Invalid input(s): "FREET3" Anemia work up No results for input(s): "VITAMINB12", "FOLATE", "FERRITIN", "TIBC", "IRON", "RETICCTPCT" in the last 72 hours. Urinalysis    Component Value Date/Time   COLORURINE YELLOW  11/30/2020 1747   APPEARANCEUR CLEAR 11/30/2020 1747   APPEARANCEUR Clear 06/17/2012 2222   LABSPEC 1.016 11/30/2020 1747   LABSPEC 1.019 06/17/2012 2222   PHURINE 7.0 11/30/2020 1747   GLUCOSEU NEGATIVE 11/30/2020 1747   GLUCOSEU Negative 06/17/2012 2222   HGBUR NEGATIVE 11/30/2020 1747   BILIRUBINUR NEGATIVE 11/30/2020 1747   BILIRUBINUR Negative 06/17/2012 2222   KETONESUR NEGATIVE 11/30/2020 1747   PROTEINUR NEGATIVE 11/30/2020 1747   NITRITE NEGATIVE 11/30/2020 1747   LEUKOCYTESUR NEGATIVE 11/30/2020 1747   LEUKOCYTESUR Negative 06/17/2012 2222   Sepsis Labs Recent Labs  Lab 11/30/21 1137 12/02/21 0442 12/03/21 0448  WBC 14.7* 5.8 6.2   Microbiology No results found for this or any previous visit (from the past 240 hour(s)). Imaging No results found.    Time coordinating discharge: Over 30 minutes  SIGNED:  Sunnie Nielsen DO [facility]

## 2021-12-03 NOTE — Progress Notes (Signed)
Tried calling [employer] for update of ETA. No answer. Contacted [employer] will arrive in 30 minutes per dispatch. Patient given voucher.  [name], RN

## 2021-12-03 NOTE — Progress Notes (Addendum)
Responded to call from volunteer, volunteer in the room to discharge patient.  Patient was found drowsy, per volunteer, "patient  seen by the bathroom door, going back to the bed, gait unsteady and fell back in the bed".  Patient denies falling to the floor nor hurting himself.  Patient is currently drowsy, no respiratory distress.  Assisted to the bathroom and back to the bed.  Patient with 1:1 NT sitter at this time.  Counted medications delivered by pharmacy, quetiapine x 14 tablets, hydroxyzine x 90 tablets, and sertraline x 30 tablets.  This RN asked if patient took his meds, patient verbalized "yes, I took them".  Charge RN and Dr. Lyn Hollingshead made aware.

## 2021-12-03 NOTE — Progress Notes (Addendum)
Discharge order in place.  Discharge instructions given to patient.  Patient verbalized understanding.  Waiting for meds to be delivered to the room.  As well waiting for ED advise on pt belongings.

## 2021-12-03 NOTE — Progress Notes (Addendum)
Discharge held - noted to be lethargic, RN counted meds - he had apparently taken 15 Seroquel which had been provided to bedside in anticipation of discharge. RN is calling poison control, pt is rousable and protecting airway. Will order sitter and await poison control recs. Holding seroquel. EKG pending. Will alert psych to reassess as they had previously cleared him - secondary gain vs true self-harm, but at this point he cannot be safely discharged, orders cancelled. Psychiatry will reevaluate when medically stable. Per RN: poison control advised to monitor cardiac for 6 hours, check electrolytes, specifically potassium and magnesium, and EKG. Orders placed for serial labs and EKG and will follow. Pt is on telemetry. EKG reviewd: NSR and normal intervals

## 2021-12-03 NOTE — Progress Notes (Addendum)
Patient's belongings locked up in ED, brought up to the room and handed by L. Lucie Leather, EMT-P to the patient.  Belongings include and all accounted for: 2 bracelets Watch Lighter 1 ring Shoes.  Shorts Shirt Pt requested to toss socks in trash. Phone

## 2021-12-03 NOTE — Progress Notes (Signed)
Rounding on patient this morning.  Noticed he did not have sitter.  Patient expressed his frustration saying "they are not listening to me about me still having suicidal ideations.  That's why I came in here. I thought I would be admitted to inpatient psych, but now they are sending me home".  Message sent to psych and Attending.

## 2021-12-03 NOTE — TOC Progression Note (Addendum)
Transition of Care [facility]) - Progression Note    Patient Details  Name: Nathan Koch MRN: [id number] Date of Birth: 11-May-1990  Transition of Care [facility]) CM/SW Contact  Chapman Fitch, RN Phone Number: 12/03/2021, 10:39 AM  Clinical Narrative:      Patient inquires why he is not being admitted to inpatient psych, patient states he does not feel like he should be discharged.  MD and psych notified  Per psych patient does not meet inpatient criteria for psych admission   Patient states that he called [facility] and they have a 2 week wait list, he has not called any of the other detox centers - Patient states he has no where to go at discharge - Discussed homeless shelters.  Patient declines - Discussed [facility] year program in [place], patient declines - Patient briefly mentioned the rescue mission in [place], then states "no I don't want to go there" - Patient request cab voucher to [facility] in [place].   - Medical team updated        Expected Discharge Plan and Services                                                 Social Determinants of Health (SDOH) Interventions    Readmission Risk Interventions     No data to display

## 2021-12-03 NOTE — Progress Notes (Addendum)
Pharmacy delivered meds to the room.  Called cab service [employer]) for patient.  Per operator, they will call back to let us know when they dispatch a service.

## 2021-12-03 NOTE — Progress Notes (Signed)
Received call from poison control, updated on recent lab results and EKG.

## 2021-12-04 ENCOUNTER — Inpatient Hospital Stay
Admission: AD | Admit: 2021-12-04 | Discharge: 2021-12-11 | DRG: 885 | Disposition: A | Discharge status: Home / Self Care | Payer: 59 | Source: Intra-hospital | Attending: Psychiatry | Admitting: Psychiatry

## 2021-12-04 DIAGNOSIS — F17210 Nicotine dependence, cigarettes, uncomplicated: Secondary | ICD-10-CM | POA: Diagnosis present

## 2021-12-04 DIAGNOSIS — J45909 Unspecified asthma, uncomplicated: Secondary | ICD-10-CM | POA: Diagnosis present

## 2021-12-04 DIAGNOSIS — K5900 Constipation, unspecified: Secondary | ICD-10-CM | POA: Diagnosis present

## 2021-12-04 DIAGNOSIS — F1520 Other stimulant dependence, uncomplicated: Secondary | ICD-10-CM | POA: Diagnosis present

## 2021-12-04 DIAGNOSIS — T43592A Poisoning by other antipsychotics and neuroleptics, intentional self-harm, initial encounter: Secondary | ICD-10-CM | POA: Diagnosis present

## 2021-12-04 DIAGNOSIS — F332 Major depressive disorder, recurrent severe without psychotic features: Secondary | ICD-10-CM | POA: Diagnosis present

## 2021-12-04 DIAGNOSIS — Z9151 Personal history of suicidal behavior: Secondary | ICD-10-CM | POA: Diagnosis not present

## 2021-12-04 DIAGNOSIS — Z825 Family history of asthma and other chronic lower respiratory diseases: Secondary | ICD-10-CM

## 2021-12-04 DIAGNOSIS — Z79899 Other long term (current) drug therapy: Secondary | ICD-10-CM | POA: Diagnosis not present

## 2021-12-04 LAB — CBC
HCT: 37.8 % — ABNORMAL LOW (ref 39.0–52.0)
Hemoglobin: 12.7 g/dL — ABNORMAL LOW (ref 13.0–17.0)
MCH: 28.5 pg (ref 26.0–34.0)
MCHC: 33.6 g/dL (ref 30.0–36.0)
MCV: 84.8 fL (ref 80.0–100.0)
Platelets: 263 10*3/uL (ref 150–400)
RBC: 4.46 MIL/uL (ref 4.22–5.81)
RDW: 12.9 % (ref 11.5–15.5)
WBC: 6.4 10*3/uL (ref 4.0–10.5)
nRBC: 0 % (ref 0.0–0.2)

## 2021-12-04 LAB — BASIC METABOLIC PANEL
Anion gap: 5 (ref 5–15)
BUN: 9 mg/dL (ref 6–20)
CO2: 27 mmol/L (ref 22–32)
Calcium: 8.4 mg/dL — ABNORMAL LOW (ref 8.9–10.3)
Chloride: 108 mmol/L (ref 98–111)
Creatinine, Ser: 0.79 mg/dL (ref 0.61–1.24)
GFR, Estimated: >60 mL/min (ref >60)
Glucose, Bld: 152 mg/dL — ABNORMAL HIGH (ref 70–99)
Potassium: 3.5 mmol/L (ref 3.5–5.1)
Sodium: 140 mmol/L (ref 135–145)

## 2021-12-04 LAB — MAGNESIUM: Magnesium: 2.1 mg/dL (ref 1.7–2.4)

## 2021-12-04 MED ORDER — MAGNESIUM HYDROXIDE 400 MG/5ML PO SUSP
30.0000 mL | Freq: Every day | ORAL | Status: DC | PRN
  Administered 2021-12-07: 30 mL via ORAL
  Filled 2021-12-04: qty 30

## 2021-12-04 MED ORDER — TRAZODONE HCL 100 MG PO TABS
100.0000 mg | ORAL_TABLET | Freq: Every evening | ORAL | Status: DC | PRN
  Administered 2021-12-04 – 2021-12-10 (×7): 100 mg via ORAL
  Filled 2021-12-04 (×8): qty 1

## 2021-12-04 MED ORDER — HYDROXYZINE HCL 50 MG PO TABS
50.0000 mg | ORAL_TABLET | Freq: Three times a day (TID) | ORAL | Status: DC | PRN
  Administered 2021-12-04 – 2021-12-11 (×6): 50 mg via ORAL
  Filled 2021-12-04 (×6): qty 1

## 2021-12-04 MED ORDER — ACETAMINOPHEN 325 MG PO TABS
650.0000 mg | ORAL_TABLET | Freq: Four times a day (QID) | ORAL | Status: DC | PRN
  Administered 2021-12-10 – 2021-12-11 (×2): 650 mg via ORAL
  Filled 2021-12-04 (×2): qty 2

## 2021-12-04 MED ORDER — SERTRALINE HCL 25 MG PO TABS
50.0000 mg | ORAL_TABLET | Freq: Every day | ORAL | Status: DC
  Administered 2021-12-05 – 2021-12-11 (×7): 50 mg via ORAL
  Filled 2021-12-04 (×7): qty 2

## 2021-12-04 MED ORDER — ALUM & MAG HYDROXIDE-SIMETH 200-200-20 MG/5ML PO SUSP: 30.0000 mL | ORAL | Status: DC | PRN

## 2021-12-04 NOTE — Discharge Summary (Signed)
Physician Discharge Summary   Patient: Nathan Koch MRN: [id number]  DOB: [birth year]   Admit:     Date of Admission: 11/30/2021 Admitted from: home   Discharge: Date of discharge: 12/04/21 Disposition: Inpatient psychiatry  Condition at discharge: good  CODE STATUS: FULL      Discharge Physician: Sunnie Nielsen, DO [facility]     PCP: Pcp, No  Recommendations for Outpatient Follow-up:  Follow up with PCP in 1-2 weeks Please obtain labs/tests: CK, BMP in 1-2 weeks Please follow up on the following pending results: none Please follow psychiatry as directed    Hospital Course: Nathan Koch is a 31 y.o. male with medical history significant for Methamphetamine use who presented to the ED 11/30/2021 voluntarily reporting a relapse on meth as well as suicidal ideation.  He requested to see psychiatry.  He was noted in the ED to be ' tweaking' with picking of the skin and somewhat hyperactive and a work-up was done. 08/06: Tachycardic to 112, VS WNL otherwise, WBC 14.7, Cr 1.63, AST 144 ALT 55, repeat BMP later showed normalization of creatinine to 1.16.  UDS positive for amphetamines.  CK 6602, started on IV fluids.  Admitted to hospitalist service for rhabdomyolysis and medical clearance pending further evaluation by psychiatry.  Initially was recommended for inpatient psychiatric hospitalization 08/07: LFT trending down.  AKI resolved.  Formal psychiatry assessment, advised to continue to follow 08/08: Follow-up psychiatry assessment, no evidence at this time for paranoia, psychosis, mania, no longer meeting criteria for involuntary commitment or inpatient psychiatric admission and IVC was released/discontinued. 08/09: CK down to 587 and Cr stable, appropriate for discharge today which was confirmed discharge plan and medications w/ psychiatry  - however pt took 15 Seroquel tabs and d/c was held. Poison control contracted, labs/EKG were trended. Psychiatry team  made aware. Once medically stable plan to admit to inpatient psychiatric unit.  08/10: nonspecific abn EKG overnight, repeat this AM same -ask cardiologist on-call to review, confirmed no concerns from Seroquel dosage and changes are nonspecific.  Advised remain on telemetry total 24 hours since ingestion and if no events should be clear for discharge.  Psychiatry planning on inpatient psychiatric admission later this evening    Consultants:  Psychiatry  Procedures:  None       Discharge Diagnoses: Principal Problem:   Suicidal ideation Active Problems:   Rhabdomyolysis   Methamphetamine use disorder, severe ([name])   AKI (acute kidney injury) ([name])   Methamphetamine intoxication ([name])   Elevated LFTs   Asthma    Assessment & Plan:  Methamphetamine intoxication ([name]) Methamphetamine use disorder, severe Patient was reportedly "tweaking" skin picking and twitching and hyperactive in the emergency room Improved/resolved over course of hospitalization Supportive care Encouraged abstinence from recreational drugs  Intentional over use/ingestion prescription medication: Seroquel See hospital course as above EKG and telemetry no concerns, cardiology has reviewed EKGs as well. No electrolyte derangements Medically cleared for psychiatric inpatient admission   Suicidal ideation Patient came in voluntarily Psychiatry evaluated patient, IVC was discontinued and patient was not meeting criteria for inpatient admission however subsequently had an ingestion of Seroquel prior to discharge from inpatient unit and discharge was held.  See below suicidal ideation resolved but he is exhibiting dangerous/impulsive behavior and intentional medication overuse/overdose, psychiatry following  AKI (acute kidney injury) ([name]) Resolved Likely secondary to ATN from dehydration and rhabdo  Rhabdomyolysis K trending down traumatic CK 7000s on presentation, trended down to 500s   Elevated  LFTs Trending downward likely secondary to Rhabdo Had modestly elevated AST and ALT about 7 months ago in the 40s, but now elevated 144/55 down to 125/50 with hydration  Methamphetamine use disorder, severe ([name]) Per UDS See above       Discharge Instructions  Allergies as of 12/04/2021       Reactions   Latex Hives        Medication List     STOP taking these medications    busPIRone 5 MG tablet Commonly known as: BUSPAR   Cepacol Regular Strength 3 MG lozenge Generic drug: menthol-cetylpyridinium   hydrOXYzine 25 MG capsule Commonly known as: Vistaril   menthol-cetylpyridinium 3 MG lozenge Commonly known as: CEPACOL   QUEtiapine 100 MG tablet Commonly known as: SEROQUEL   QUEtiapine 50 MG tablet Commonly known as: SEROquel       TAKE these medications    albuterol 108 (90 Base) MCG/ACT inhaler Commonly known as: VENTOLIN HFA Inhale 1-2 puffs into the lungs every 4 (four) hours as needed for wheezing or shortness of breath. What changed:  how much to take Another medication with the same name was removed. Continue taking this medication, and follow the directions you see here.   hydrOXYzine 25 MG tablet Commonly known as: ATARAX Take 1 tablet (25 mg total) by mouth 3 (three) times daily as needed for anxiety. What changed: Another medication with the same name was removed. Continue taking this medication, and follow the directions you see here.   nicotine 14 mg/24hr patch Commonly known as: NICODERM CQ - dosed in mg/24 hours Place 1 patch (14 mg total) onto the skin daily. What changed: Another medication with the same name was removed. Continue taking this medication, and follow the directions you see here.   sertraline 50 MG tablet Commonly known as: ZOLOFT Take 1 tablet (50 mg total) by mouth at bedtime. What changed: Another medication with the same name was removed. Continue taking this medication, and follow the directions you see here.           Allergies  Allergen Reactions   Latex Hives     Subjective: pt denies pain, no CP/SOB, no HA/VC, he is eating/drinking normally   Discharge Exam: BP 117/63 (BP Location: Right Arm)   Pulse (!) 58   Temp 97.8 F (36.6 C) (Oral)   Resp 16   Ht 5\' 9"  (1.753 m)   Wt 102.1 kg   SpO2 97%   BMI 33.23 kg/m  General: Pt is alert, awake, not in acute distress Cardiovascular: RRR, S1/S2 +, no rubs, no gallops Respiratory: CTA bilaterally, no wheezing, no rhonchi Abdominal: Soft, NT, ND, bowel sounds + Extremities: no edema, no cyanosis     The results of significant diagnostics from this hospitalization (including imaging, microbiology, ancillary and laboratory) are listed below for reference.     Microbiology: No results found for this or any previous visit (from the past 240 hour(s)).   Labs: BNP (last 3 results) No results for input(s): "BNP" in the last 8760 hours. Basic Metabolic Panel: Recent Labs  Lab 12/02/21 0442 12/03/21 0448 12/03/21 1641 12/03/21 2209 12/04/21 0438  NA 138 142 143 140 140  K 3.7 3.7 3.9 3.5 3.5  CL 111 114* 111 107 108  CO2 24 26 26 26 27   GLUCOSE 125* 117* 91 210* 152*  BUN 9 7 7 7 9   CREATININE 0.65 0.62 0.71 0.80 0.79  CALCIUM 7.8* 8.3* 8.8* 8.3* 8.4*  MG 2.0 2.0 2.1  1.9 2.1   Liver Function Tests: Recent Labs  Lab 11/30/21 1137 11/30/21 1819 12/01/21 0313  AST 144* 125* 94*  ALT 55* 50* 41  ALKPHOS 73 60 49  BILITOT 2.9* 2.5* 1.9*  PROT 9.0* 7.9 6.4*  ALBUMIN 5.0 4.2 3.4*   No results for input(s): "LIPASE", "AMYLASE" in the last 168 hours. No results for input(s): "AMMONIA" in the last 168 hours. CBC: Recent Labs  Lab 11/30/21 1137 12/02/21 0442 12/03/21 0448 12/04/21 0438  WBC 14.7* 5.8 6.2 6.4  HGB 15.6 11.4* 11.9* 12.7*  HCT 47.2 34.2* 35.5* 37.8*  MCV 83.2 84.0 84.5 84.8  PLT 432* 246 262 263   Cardiac Enzymes: Recent Labs  Lab 11/30/21 1441 11/30/21 1819 12/01/21 0313 12/02/21 0442  12/03/21 0448  CKTOTAL 7,469* 6,602* 3,500* 1,359* 587*   BNP: Invalid input(s): "POCBNP" CBG: No results for input(s): "GLUCAP" in the last 168 hours. D-Dimer No results for input(s): "DDIMER" in the last 72 hours. Hgb A1c No results for input(s): "HGBA1C" in the last 72 hours. Lipid Profile No results for input(s): "CHOL", "HDL", "LDLCALC", "TRIG", "CHOLHDL", "LDLDIRECT" in the last 72 hours. Thyroid function studies No results for input(s): "TSH", "T4TOTAL", "T3FREE", "THYROIDAB" in the last 72 hours.  Invalid input(s): "FREET3" Anemia work up No results for input(s): "VITAMINB12", "FOLATE", "FERRITIN", "TIBC", "IRON", "RETICCTPCT" in the last 72 hours. Urinalysis    Component Value Date/Time   COLORURINE YELLOW 11/30/2020 1747   APPEARANCEUR CLEAR 11/30/2020 1747   APPEARANCEUR Clear 06/17/2012 2222   LABSPEC 1.016 11/30/2020 1747   LABSPEC 1.019 06/17/2012 2222   PHURINE 7.0 11/30/2020 1747   GLUCOSEU NEGATIVE 11/30/2020 1747   GLUCOSEU Negative 06/17/2012 2222   HGBUR NEGATIVE 11/30/2020 1747   BILIRUBINUR NEGATIVE 11/30/2020 1747   BILIRUBINUR Negative 06/17/2012 2222   KETONESUR NEGATIVE 11/30/2020 1747   PROTEINUR NEGATIVE 11/30/2020 1747   NITRITE NEGATIVE 11/30/2020 1747   LEUKOCYTESUR NEGATIVE 11/30/2020 1747   LEUKOCYTESUR Negative 06/17/2012 2222   Sepsis Labs Recent Labs  Lab 11/30/21 1137 12/02/21 0442 12/03/21 0448 12/04/21 0438  WBC 14.7* 5.8 6.2 6.4   Microbiology No results found for this or any previous visit (from the past 240 hour(s)). Imaging No results found.    Time coordinating discharge: Over 30 minutes  SIGNED:  Sunnie Nielsen DO [facility]

## 2021-12-04 NOTE — Progress Notes (Addendum)
       CROSS COVER NOTE  NAME: CULLEN LAHAIE MRN: [id number] DOB : 1990/08/21    Date of Service   12/05/21  HPI/Events of Note   PM followup on patient signed out by rounding team. K 3.5 and Mg 1.9 at 2209.   EKG read shows ST elevations in V3, V5, V6 that are less than 85mm. Mr Lacko denies chest pain or shortness of breath at this time.  Interventions   Plan: Repeat EKG at 0500A Follow AM labs        This document was prepared using Dragon voice recognition software and may include unintentional dictation errors.  Bishop Limbo DNP, MHA, FNP-BC Nurse Practitioner [facility] Pager [phone]

## 2021-12-04 NOTE — BH Assessment (Signed)
Patient is to be admitted to [facility] by Dr. Toni Amend.  Attending Physician will be Dr.  Toni Amend .   Patient has been assigned to room 324, by [facility] Charge Nurse Demetria.   Intake Paper Work has been signed and placed on patient chart.  Staff is aware of the admission: Dr. Sunnie Nielsen, Attending MD  Marylee Floras, Patient's Nurse  Sterling Big, Patient Access.   Pt can transport anytime after 8 AM.

## 2021-12-04 NOTE — BH Assessment (Signed)
Pt has signed voluntary consent forms and has been preadmitted to [facility].

## 2021-12-04 NOTE — Progress Notes (Signed)
Report given to [facility] RN. Pt discharged per wheelchair with security escort to [place] unit.

## 2021-12-05 ENCOUNTER — Other Ambulatory Visit: Payer: Self-pay

## 2021-12-05 ENCOUNTER — Encounter: Payer: Self-pay | Admitting: Psychiatry

## 2021-12-05 DIAGNOSIS — F332 Major depressive disorder, recurrent severe without psychotic features: Secondary | ICD-10-CM | POA: Diagnosis not present

## 2021-12-05 MED ORDER — ALBUTEROL SULFATE HFA 108 (90 BASE) MCG/ACT IN AERS
2.0000 | INHALATION_SPRAY | Freq: Four times a day (QID) | RESPIRATORY_TRACT | Status: DC | PRN
  Administered 2021-12-05 – 2021-12-11 (×9): 2 via RESPIRATORY_TRACT
  Filled 2021-12-05: qty 6.7

## 2021-12-05 MED ORDER — LAMOTRIGINE 25 MG PO TABS
25.0000 mg | ORAL_TABLET | Freq: Every day | ORAL | Status: DC
  Administered 2021-12-05 – 2021-12-11 (×7): 25 mg via ORAL
  Filled 2021-12-05 (×7): qty 1

## 2021-12-05 MED ORDER — QUETIAPINE FUMARATE 100 MG PO TABS
100.0000 mg | ORAL_TABLET | Freq: Every day | ORAL | Status: DC
  Administered 2021-12-05 – 2021-12-10 (×6): 100 mg via ORAL
  Filled 2021-12-05 (×6): qty 1

## 2021-12-05 NOTE — Group Note (Signed)
[facility] LCSW Group Therapy Note   Group Date: 12/05/2021 Start Time: 1300 End Time: 1400  Type of Therapy and Topic:  Group Therapy:  Feelings around Relapse and Recovery  Participation Level:  Did Not Attend    Description of Group:    Patients in this group will discuss emotions they experience before and after a relapse. They will process how experiencing these feelings, or avoidance of experiencing them, relates to having a relapse. Facilitator will guide patients to explore emotions they have related to recovery. Patients will be encouraged to process which emotions are more powerful. They will be guided to discuss the emotional reaction significant others in their lives may have to patients' relapse or recovery. Patients will be assisted in exploring ways to respond to the emotions of others without this contributing to a relapse.  Therapeutic Goals: Patient will identify two or more emotions that lead to relapse for them:  Patient will identify two emotions that result when they relapse:  Patient will identify two emotions related to recovery:  Patient will demonstrate ability to communicate their needs through discussion and/or role plays.   Summary of Patient Progress: Pt did not attend group despite invitation from CSW.   Therapeutic Modalities:   Cognitive Behavioral Therapy Solution-Focused Therapy Assertiveness Training Relapse Prevention Therapy   Sheril Hammond R Esperansa Sarabia, LCSW 

## 2021-12-05 NOTE — H&P (Signed)
Psychiatric Admission Assessment Adult  Patient Identification: Nathan Koch MRN:  [id number] Date of Evaluation:  12/05/2021 Chief Complaint:  Severe recurrent major depression without psychotic features ([name]) [F33.2] Principal Diagnosis: Severe recurrent major depression without psychotic features ([name]) Diagnosis:  Principal Problem:   Severe recurrent major depression without psychotic features ([name]) Active Problems:   Methamphetamine use disorder, severe ([name])  History of Present Illness: Patient seen and chart reviewed.  31 year old man history of depression and substance abuse.  He was on the medical service being treated for rhabdomyolysis in the context of amphetamine abuse.  At the time of discharge he took an intentional overdose of Seroquel with suicidal intent.  Patient says that he had been doing pretty well and been stable in the substance abuse program that he was attending in [place] but that he left there and relapsed on methamphetamine and stopped his medicine which is how he wound up back at our hospital.  Patient gets hopeless and depressed when he does not have a place to live or clear discharge plan.  Mood here in the hospital now is better.  No acute suicidal intent.  No psychotic symptoms.  Patient has done well with medication in the past but really wants to get back into a substance abuse program Associated Signs/Symptoms: Depression Symptoms:  depressed mood, difficulty concentrating, hopelessness, suicidal attempt, Duration of Depression Symptoms: Less than two weeks  (Hypo) Manic Symptoms:  Distractibility, Anxiety Symptoms:  Excessive Worry, Psychotic Symptoms:   None currently PTSD Symptoms: Negative Total Time spent with patient: 1 hour  Past Psychiatric History: Past history of depression and anxiety substance abuse previous suicide attempts and threats  Is the patient at risk to self? Yes.    Has the patient been a risk to self in the past 6  months? Yes.    Has the patient been a risk to self within the distant past? Yes.    Is the patient a risk to others? No.  Has the patient been a risk to others in the past 6 months? No.  Has the patient been a risk to others within the distant past? No.   [place] Scale:  Flowsheet Row Admission (Current) [facility] to Hosp-Admission (Discharged) from [facility] from [facility]  C-SSRS RISK CATEGORY High Risk High Risk No Risk        Prior Inpatient Therapy:   Prior Outpatient Therapy:    Alcohol Screening: Patient refused Alcohol Screening Tool: Yes 1. How often do you have a drink containing alcohol?: Never 2. How many drinks containing alcohol do you have on a typical day when you are drinking?: 1 or 2 3. How often do you have six or more drinks on one occasion?: Never AUDIT-C Score: 0 Substance Abuse History in the last 12 months:  Yes.   Consequences of Substance Abuse: Loss of living situation loss of social support worsening of mood symptoms suicide attempts Previous Psychotropic Medications: Yes  Psychological Evaluations: Yes  Past Medical History:  Past Medical History:  Diagnosis Date   Anxiety    Anxiety disorder    Asthma    Bipolar 1 disorder ([name])    Depression    Methamphetamine abuse ([name])    PTSD (post-traumatic stress disorder)     Past Surgical History:  Procedure Laterality Date   CLOSED REDUCTION MANDIBLE N/A 07/02/2018   Procedure: CLOSED REDUCTION MANDIBULAR;  Surgeon: Vernie Murders, MD;  Location: [facility] ORS;  Service: ENT;  Laterality: N/A;   Family History:  Family History  Problem Relation Age of Onset   Asthma Mother    Cancer Mother        breast cancer   Ulcers Mother    Cancer Father        esophogeal cancer   Hypertension Father    Asthma Brother    Family Psychiatric  History: See  previous Tobacco Screening:   Social History:  Social History   Substance and Sexual Activity  Alcohol Use Not Currently   Alcohol/week: 13.0 standard drinks of alcohol   Types: 6 Cans of beer, 7 Standard drinks or equivalent per week   Comment: previously heavy drinker 2016. most recent 4 beer/ day drinker and none since 01-11-2020     Social History   Substance and Sexual Activity  Drug Use Yes   Types: Methamphetamines   Comment: states he uses 1 g every other day    Additional Social History:                           Allergies:   Allergies  Allergen Reactions   Latex Hives   Lab Results:  Results for orders placed or performed during the hospital encounter of 11/30/21 (from the past 48 hour(s))  Basic metabolic panel     Status: Abnormal   Collection Time: 12/03/21  4:41 PM  Result Value Ref Range   Sodium 143 135 - 145 mmol/L   Potassium 3.9 3.5 - 5.1 mmol/L   Chloride 111 98 - 111 mmol/L   CO2 26 22 - 32 mmol/L   Glucose, Bld 91 70 - 99 mg/dL    Comment: Glucose reference range applies only to samples taken after fasting for at least 8 hours.   BUN 7 6 - 20 mg/dL   Creatinine, Ser 4.97 0.61 - 1.24 mg/dL   Calcium 8.8 (L) 8.9 - 10.3 mg/dL   GFR, Estimated >02 >63 mL/min    Comment: (NOTE) Calculated using the CKD-EPI Creatinine Equation (2021)    Anion gap 6 5 - 15    Comment: Performed at [facility], [address], [place], [place] [address]  Magnesium     Status: None   Collection Time: 12/03/21  4:41 PM  Result Value Ref Range   Magnesium 2.1 1.7 - 2.4 mg/dL    Comment: Performed at [facility], [address] [place], [place] [address]  Magnesium     Status: None   Collection Time: 12/03/21 10:09 PM  Result Value Ref Range   Magnesium 1.9 1.7 - 2.4 mg/dL    Comment: Performed at [facility], [address] [place], [place] [address]  Basic metabolic panel     Status: Abnormal   Collection Time: 12/03/21  10:09 PM  Result Value Ref Range   Sodium 140 135 - 145 mmol/L   Potassium 3.5 3.5 - 5.1 mmol/L   Chloride 107 98 - 111 mmol/L   CO2 26 22 - 32 mmol/L   Glucose, Bld 210 (H) 70 - 99 mg/dL    Comment: Glucose reference range applies only to samples taken after fasting for at least 8 hours.   BUN 7 6 - 20 mg/dL   Creatinine, Ser 8.67 0.61 - 1.24 mg/dL   Calcium 8.3 (L) 8.9 - 10.3 mg/dL   GFR, Estimated >67 >20 mL/min    Comment: (  NOTE) Calculated using the CKD-EPI Creatinine Equation (2021)    Anion gap 7 5 - 15    Comment: Performed at [facility], [address] [place], [place] [address]  Basic metabolic panel     Status: Abnormal   Collection Time: 12/04/21  4:38 AM  Result Value Ref Range   Sodium 140 135 - 145 mmol/L   Potassium 3.5 3.5 - 5.1 mmol/L   Chloride 108 98 - 111 mmol/L   CO2 27 22 - 32 mmol/L   Glucose, Bld 152 (H) 70 - 99 mg/dL    Comment: Glucose reference range applies only to samples taken after fasting for at least 8 hours.   BUN 9 6 - 20 mg/dL   Creatinine, Ser 4.88 0.61 - 1.24 mg/dL   Calcium 8.4 (L) 8.9 - 10.3 mg/dL   GFR, Estimated >89 >16 mL/min    Comment: (NOTE) Calculated using the CKD-EPI Creatinine Equation (2021)    Anion gap 5 5 - 15    Comment: Performed at [facility], [address] [place], [place] [address]  CBC     Status: Abnormal   Collection Time: 12/04/21  4:38 AM  Result Value Ref Range   WBC 6.4 4.0 - 10.5 K/uL   RBC 4.46 4.22 - 5.81 MIL/uL   Hemoglobin 12.7 (L) 13.0 - 17.0 g/dL   HCT 88.8 (L) 28.0 - 03.4 %   MCV 84.8 80.0 - 100.0 fL   MCH 28.5 26.0 - 34.0 pg   MCHC 33.6 30.0 - 36.0 g/dL   RDW 91.7 91.5 - 05.6 %   Platelets 263 150 - 400 K/uL   nRBC 0.0 0.0 - 0.2 %    Comment: Performed at [facility], [address], [place], [place] [address]  Magnesium     Status: None   Collection Time: 12/04/21  4:38 AM  Result Value Ref Range   Magnesium 2.1 1.7 - 2.4 mg/dL    Comment: Performed  at [facility], [address] [place], [place] [address]    Blood Alcohol level:  Lab Results  Component Value Date   [facility] <10 11/30/2021   ETH <10 04/14/2021    Metabolic Disorder Labs:  Lab Results  Component Value Date   HGBA1C 6.0 (H) 01/17/2021   MPG 126 01/17/2021   MPG 126 10/08/2020   No results found for: "PROLACTIN" Lab Results  Component Value Date   CHOL 94 01/17/2021   TRIG 100 01/17/2021   HDL 28 (L) 01/17/2021   CHOLHDL 3.4 01/17/2021   VLDL 20 01/17/2021   LDLCALC 46 01/17/2021   LDLCALC 49 10/08/2020    Current Medications: Current Facility-Administered Medications  Medication Dose Route Frequency Provider Last Rate Last Admin   acetaminophen (TYLENOL) tablet 650 mg  650 mg Oral Q6H PRN Lyndi Holbein T, MD       albuterol (VENTOLIN HFA) 108 (90 Base) MCG/ACT inhaler 2 puff  2 puff Inhalation Q6H PRN Loza Prell T, MD       alum & mag hydroxide-simeth (MAALOX/MYLANTA) 200-200-20 MG/5ML suspension 30 mL  30 mL Oral Q4H PRN Elverna Caffee, Jackquline Denmark, MD       hydrOXYzine (ATARAX) tablet 50 mg  50 mg Oral TID PRN Rashod Gougeon, Jackquline Denmark, MD   50 mg at 12/04/21 2144   lamoTRIgine (LAMICTAL) tablet 25 mg  25 mg Oral Daily Nicolina Hirt, Jackquline Denmark, MD   25 mg at 12/05/21 1542   magnesium hydroxide (MILK OF MAGNESIA) suspension 30  mL  30 mL Oral Daily PRN Brett Darko T, MD       QUEtiapine (SEROQUEL) tablet 100 mg  100 mg Oral QHS Holliday Sheaffer T, MD       sertraline (ZOLOFT) tablet 50 mg  50 mg Oral Daily Monique Gift T, MD   50 mg at 12/05/21 0902   traZODone (DESYREL) tablet 100 mg  100 mg Oral QHS PRN Mishael Krysiak, Jackquline Denmark, MD   100 mg at 12/04/21 2144   PTA Medications: Medications Prior to Admission  Medication Sig Dispense Refill Last Dose   albuterol (VENTOLIN HFA) 108 (90 Base) MCG/ACT inhaler Inhale 1-2 puffs into the lungs every 4 (four) hours as needed for wheezing or shortness of breath. 6.7 g 0 12/04/2021   hydrOXYzine (ATARAX) 25 MG tablet Take 1 tablet (25 mg  total) by mouth 3 (three) times daily as needed for anxiety. 90 tablet 0 12/04/2021   sertraline (ZOLOFT) 50 MG tablet Take 1 tablet (50 mg total) by mouth at bedtime. 30 tablet 0 12/04/2021   nicotine (NICODERM CQ - DOSED IN MG/24 HOURS) 14 mg/24hr patch Place 1 patch (14 mg total) onto the skin daily. 14 patch 0     Musculoskeletal: Strength & Muscle Tone: within normal limits Gait & Station: normal Patient leans: N/A            Psychiatric Specialty Exam:  Presentation  General Appearance: Appropriate for Environment  Eye Contact:Good  Speech:Clear and Coherent  Speech Volume:Normal  Handedness:Right   Mood and Affect  Mood:Euthymic  Affect:Congruent   Thought Process  Thought Processes:Coherent  Duration of Psychotic Symptoms: N/A  Past Diagnosis of Schizophrenia or Psychoactive disorder: No  Descriptions of Associations:Intact  Orientation:Full (Time, Place and Person)  Thought Content:Logical  Hallucinations:No data recorded Ideas of Reference:None  Suicidal Thoughts:No data recorded Homicidal Thoughts:No data recorded  Sensorium  Memory:Immediate Good; Recent Good  Judgment:Fair  Insight:Fair   Executive Functions  Concentration:Fair  Attention Span:Good  Recall:Good  Fund of Knowledge:Fair  Language:Fair   Psychomotor Activity  Psychomotor Activity:No data recorded  Assets  Assets:Desire for Improvement; Resilience; Physical Health   Sleep  Sleep:No data recorded   Physical Exam: Physical Exam Vitals and nursing note reviewed.  Constitutional:      Appearance: Normal appearance.  HENT:     Head: Normocephalic and atraumatic.     Mouth/Throat:     Pharynx: Oropharynx is clear.  Eyes:     Pupils: Pupils are equal, round, and reactive to light.  Cardiovascular:     Rate and Rhythm: Normal rate and regular rhythm.  Pulmonary:     Effort: Pulmonary effort is normal.     Breath sounds: Normal breath sounds.   Abdominal:     General: Abdomen is flat.     Palpations: Abdomen is soft.  Musculoskeletal:        General: Normal range of motion.  Skin:    General: Skin is warm and dry.  Neurological:     General: No focal deficit present.     Mental Status: He is alert. Mental status is at baseline.  Psychiatric:        Attention and Perception: Attention normal.        Mood and Affect: Mood normal. Affect is blunt.        Speech: Speech normal.        Behavior: Behavior normal.        Thought Content: Thought content includes suicidal ideation. Thought content does not include  suicidal plan.        Cognition and Memory: Cognition normal.    Review of Systems  Constitutional: Negative.   HENT: Negative.    Eyes: Negative.   Respiratory: Negative.    Cardiovascular: Negative.   Gastrointestinal: Negative.   Musculoskeletal: Negative.   Skin: Negative.   Neurological: Negative.   Psychiatric/Behavioral:  Positive for depression and suicidal ideas.    Blood pressure 108/68, pulse (!) 58, temperature 98.4 F (36.9 C), temperature source Oral, resp. rate 18, height 5\' 9"  (1.753 m), weight 107 kg, SpO2 98 %. Body mass index is 34.85 kg/m.  Treatment Plan Summary: Plan restart medicines including Seroquel and lamotrigine and hydroxyzine.  Encourage group attendance.  Patient has already contacted the program and they want to talk to him again on Monday about possibly coming back.  Supportive counseling and therapy and encouragement.  Labs reviewed.  Observation Level/Precautions:  15 minute checks  Laboratory:  Chemistry Profile  Psychotherapy:    Medications:    Consultations:    Discharge Concerns:    Estimated LOS:  Other:     Physician Treatment Plan for Primary Diagnosis: Severe recurrent major depression without psychotic features ([name]) Long Term Goal(s): Improvement in symptoms so as ready for discharge  Short Term Goals: Ability to disclose and discuss suicidal ideas and  Ability to demonstrate self-control will improve  Physician Treatment Plan for Secondary Diagnosis: Principal Problem:   Severe recurrent major depression without psychotic features ([name]) Active Problems:   Methamphetamine use disorder, severe ([name])  Long Term Goal(s): Improvement in symptoms so as ready for discharge  Short Term Goals: Compliance with prescribed medications will improve and Ability to identify triggers associated with substance abuse/mental health issues will improve  I certify that inpatient services furnished can reasonably be expected to improve the patient's condition.    Thursday, MD 8/11/20234:36 PM

## 2021-12-05 NOTE — Progress Notes (Signed)
Patient was admitted from [facility] after reported overdose on discharge medications. He still endorses SI, with a plan to jump off a bridge but at this time, he is able to contract for safety. He is Ox4, he denies AVH. Patient has been cooperative with treatment  thus far.

## 2021-12-05 NOTE — Plan of Care (Addendum)
Pt A+Ox4, denies pain, pt reports he has thoughts of SI but no plan and will not act on them. Denies HI. Denies a/v/h. Reports depression is 6/10. Positive risk factors pt has support from his sponsor. Mr. Fantroy reports appetite is good, visible on unit yet seclusive to self, safety rounds maintained. Problem: Health Behavior/Discharge Planning: Goal: Ability to manage health-related needs will improve Outcome: Progressing   Problem: Coping: Goal: Level of anxiety will decrease Outcome: Progressing   Problem: Safety: Goal: Ability to remain free from injury will improve Outcome: Progressing

## 2021-12-05 NOTE — [facility] Suicide Risk Assessment (Signed)
[facility] Admission Suicide Risk Assessment   Nursing information obtained from:    Demographic factors:  Male, Low socioeconomic status, Divorced or widowed, Unemployed Current Mental Status:  Suicidal ideation indicated by patient, Plan includes specific time, place, or method Loss Factors:  Loss of significant relationship, Financial problems / change in socioeconomic status Historical Factors:  Prior suicide attempts, Impulsivity Risk Reduction Factors:  Responsible for children under 95 years of age  Total Time spent with patient: 1 hour Principal Problem: Severe recurrent major depression without psychotic features ([name]) Diagnosis:  Principal Problem:   Severe recurrent major depression without psychotic features ([name]) Active Problems:   Methamphetamine use disorder, severe ([name])  Subjective Data: Patient seen and chart reviewed.  30 year old man with a history of depression and substance use problems.  Transferred from the medical service after an intentional overdose of Seroquel with suicidal ideation.  Patient continues to report passive suicidal thoughts with no intent or plan.  Depressed mood hopelessness.  Nevertheless cooperative with treatment.  Continued Clinical Symptoms:    The "Alcohol Use Disorders Identification Test", Guidelines for Use in Primary Care, Second Edition.  World Science writer [facility]). Score between 0-7:  no or low risk or alcohol related problems. Score between 8-15:  moderate risk of alcohol related problems. Score between 16-19:  high risk of alcohol related problems. Score 20 or above:  warrants further diagnostic evaluation for alcohol dependence and treatment.   CLINICAL FACTORS:   Depression:   Comorbid alcohol abuse/dependence Alcohol/Substance Abuse/Dependencies   Musculoskeletal: Strength & Muscle Tone: within normal limits Gait & Station: normal Patient leans: N/A  Psychiatric Specialty Exam:  Presentation  General Appearance:  Appropriate for Environment  Eye Contact:Good  Speech:Clear and Coherent  Speech Volume:Normal  Handedness:Right   Mood and Affect  Mood:Euthymic  Affect:Congruent   Thought Process  Thought Processes:Coherent  Descriptions of Associations:Intact  Orientation:Full (Time, Place and Person)  Thought Content:Logical  History of Schizophrenia/Schizoaffective disorder:No  Duration of Psychotic Symptoms:N/A  Hallucinations:No data recorded Ideas of Reference:None  Suicidal Thoughts:No data recorded Homicidal Thoughts:No data recorded  Sensorium  Memory:Immediate Good; Recent Good  Judgment:Fair  Insight:Fair   Executive Functions  Concentration:Fair  Attention Span:Good  Recall:Good  Fund of Knowledge:Fair  Language:Fair   Psychomotor Activity  Psychomotor Activity:No data recorded  Assets  Assets:Desire for Improvement; Resilience; Physical Health   Sleep  Sleep:No data recorded   Physical Exam: Physical Exam Vitals and nursing note reviewed.  Constitutional:      Appearance: Normal appearance.  HENT:     Head: Normocephalic and atraumatic.     Mouth/Throat:     Pharynx: Oropharynx is clear.  Eyes:     Pupils: Pupils are equal, round, and reactive to light.  Cardiovascular:     Rate and Rhythm: Normal rate and regular rhythm.  Pulmonary:     Effort: Pulmonary effort is normal.     Breath sounds: Normal breath sounds.  Abdominal:     General: Abdomen is flat.     Palpations: Abdomen is soft.  Musculoskeletal:        General: Normal range of motion.  Skin:    General: Skin is warm and dry.  Neurological:     General: No focal deficit present.     Mental Status: He is alert. Mental status is at baseline.  Psychiatric:        Attention and Perception: Attention normal.        Mood and Affect: Mood is anxious.  Speech: Speech normal.        Behavior: Behavior normal.        Thought Content: Thought content includes suicidal  ideation. Thought content does not include suicidal plan.    Review of Systems  Constitutional: Negative.   HENT: Negative.    Eyes: Negative.   Respiratory: Negative.    Cardiovascular: Negative.   Gastrointestinal: Negative.   Musculoskeletal: Negative.   Skin: Negative.   Neurological: Negative.   Psychiatric/Behavioral:  Positive for depression, substance abuse and suicidal ideas.    Blood pressure 108/68, pulse (!) 58, temperature 98.4 F (36.9 C), temperature source Oral, resp. rate 18, height 5\' 9"  (1.753 m), weight 107 kg, SpO2 98 %. Body mass index is 34.85 kg/m.   COGNITIVE FEATURES THAT CONTRIBUTE TO RISK:  Thought constriction (tunnel vision)    SUICIDE RISK:   Minimal: No identifiable suicidal ideation.  Patients presenting with no risk factors but with morbid ruminations; may be classified as minimal risk based on the severity of the depressive symptoms  PLAN OF CARE: Continue 15-minute checks.  Continue medication.  Work with patient on trying to identify appropriate discharge plan  I certify that inpatient services furnished can reasonably be expected to improve the patient's condition.   , MD 12/05/2021, 4:34 PM

## 2021-12-05 NOTE — BH IP Treatment Plan (Addendum)
Interdisciplinary Treatment and Diagnostic Plan Update  12/05/2021 Time of Session: 09:45 Nathan Koch MRN: [id number]  Principal Diagnosis: Severe recurrent major depression without psychotic features [facility])  Secondary Diagnoses: Principal Problem:   Severe recurrent major depression without psychotic features ([place])   Current Medications:  Current Facility-Administered Medications  Medication Dose Route Frequency Provider Last Rate Last Admin   acetaminophen (TYLENOL) tablet 650 mg  650 mg Oral Q6H PRN Clapacs, [name], MD       albuterol (VENTOLIN HFA) 108 (90 Base) MCG/ACT inhaler 2 puff  2 puff Inhalation Q6H PRN Clapacs, [name], MD       alum & mag hydroxide-simeth (MAALOX/MYLANTA) 200-200-20 MG/5ML suspension 30 mL  30 mL Oral Q4H PRN Clapacs, [name], MD       hydrOXYzine (ATARAX) tablet 50 mg  50 mg Oral TID PRN Clapacs, [name], MD   50 mg at 12/04/21 2144   lamoTRIgine (LAMICTAL) tablet 25 mg  25 mg Oral Daily Clapacs, [name], MD   25 mg at 12/05/21 1542   magnesium hydroxide (MILK OF MAGNESIA) suspension 30 mL  30 mL Oral Daily PRN Clapacs, [name], MD       QUEtiapine (SEROQUEL) tablet 100 mg  100 mg Oral QHS Clapacs, [name], MD       sertraline (ZOLOFT) tablet 50 mg  50 mg Oral Daily Clapacs, [name], MD   50 mg at 12/05/21 0902   traZODone (DESYREL) tablet 100 mg  100 mg Oral QHS PRN Clapacs, [name], MD   100 mg at 12/04/21 2144   PTA Medications: Medications Prior to Admission  Medication Sig Dispense Refill Last Dose   albuterol (VENTOLIN HFA) 108 (90 Base) MCG/ACT inhaler Inhale 1-2 puffs into the lungs every 4 (four) hours as needed for wheezing or shortness of breath. 6.7 g 0 12/04/2021   hydrOXYzine (ATARAX) 25 MG tablet Take 1 tablet (25 mg total) by mouth 3 (three) times daily as needed for anxiety. 90 tablet 0 12/04/2021   sertraline (ZOLOFT) 50 MG tablet Take 1 tablet (50 mg total) by mouth at bedtime. 30 tablet 0 12/04/2021   nicotine (NICODERM CQ - DOSED IN MG/24  HOURS) 14 mg/24hr patch Place 1 patch (14 mg total) onto the skin daily. 14 patch 0     Patient Stressors:    Patient Strengths:    Treatment Modalities: Medication Management, Group therapy, Case management,  1 to 1 session with clinician, Psychoeducation, Recreational therapy.   Physician Treatment Plan for Primary Diagnosis: Severe recurrent major depression without psychotic features ([place]) Long Term Goal(s):     Short Term Goals:    Medication Management: Evaluate patient's response, side effects, and tolerance of medication regimen.  Therapeutic Interventions: 1 to 1 sessions, Unit Group sessions and Medication administration.  Evaluation of Outcomes: Not Met  Physician Treatment Plan for Secondary Diagnosis: Principal Problem:   Severe recurrent major depression without psychotic features ([place])  Long Term Goal(s):     Short Term Goals:       Medication Management: Evaluate patient's response, side effects, and tolerance of medication regimen.  Therapeutic Interventions: 1 to 1 sessions, Unit Group sessions and Medication administration.  Evaluation of Outcomes: Not Met   RN Treatment Plan for Primary Diagnosis: Severe recurrent major depression without psychotic features ([place]) Long Term Goal(s): Knowledge of disease and therapeutic regimen to maintain health will improve  Short Term Goals: Ability to remain free from injury will improve, Ability to verbalize frustration and  anger appropriately will improve, Ability to demonstrate self-control, Ability to participate in decision making will improve, Ability to verbalize feelings will improve, Ability to disclose and discuss suicidal ideas, Ability to identify and develop effective coping behaviors will improve, and Compliance with prescribed medications will improve  Medication Management: RN will administer medications as ordered by provider, will assess and evaluate patient's response and provide education to patient  for prescribed medication. RN will report any adverse and/or side effects to prescribing provider.  Therapeutic Interventions: 1 on 1 counseling sessions, Psychoeducation, Medication administration, Evaluate responses to treatment, Monitor vital signs and CBGs as ordered, Perform/monitor CIWA, COWS, AIMS and Fall Risk screenings as ordered, Perform wound care treatments as ordered.  Evaluation of Outcomes: Not Met   LCSW Treatment Plan for Primary Diagnosis: Severe recurrent major depression without psychotic features ([place]) Long Term Goal(s): Safe transition to appropriate next level of care at discharge, Engage patient in therapeutic group addressing interpersonal concerns.  Short Term Goals: Engage patient in aftercare planning with referrals and resources, Increase social support, Increase ability to appropriately verbalize feelings, Increase emotional regulation, Facilitate acceptance of mental health diagnosis and concerns, Facilitate patient progression through stages of change regarding substance use diagnoses and concerns, Identify triggers associated with mental health/substance abuse issues, and Increase skills for wellness and recovery  Therapeutic Interventions: Assess for all discharge needs, 1 to 1 time with Social worker, Explore available resources and support systems, Assess for adequacy in community support network, Educate family and significant other(s) on suicide prevention, Complete Psychosocial Assessment, Interpersonal group therapy.  Evaluation of Outcomes: Not Met   Progress in Treatment: Attending groups: No. Participating in groups: No. Taking medication as prescribed: Yes. Toleration medication: Yes. Family/Significant other contact made: No, will contact:  if given permission. Patient understands diagnosis: Yes. Discussing patient identified problems/goals with staff: Yes. Medical problems stabilized or resolved: Yes. Denies suicidal/homicidal ideation:  Yes. Issues/concerns per patient self-inventory: No. Other: none.  New problem(s) identified: No, Describe:  none identified.  New Short Term/Long Term Goal(s): detox, medication management for mood stabilization; elimination of SI thoughts; development of comprehensive mental wellness/sobriety plan.  Patient Goals:  "My goal is to get back into treatment and really focus on what's important in life and get my life under control."  Discharge Plan or Barriers: CSW will assist pt with development of an appropriate aftercare/discharge plan.   Reason for Continuation of Hospitalization: Depression Medication stabilization Suicidal ideation  Estimated Length of Stay: 1-7 days  Last 3 [place] Suicide Severity Risk Score: [place] Admission (Current) from 12/04/2021 in [place] ED to Hosp-Admission (Discharged) from 11/30/2021 in [place] ED from 07/23/2021 in [place] CATEGORY High Risk High Risk No Risk       Last PHQ 2/9 Scores:    04/15/2021    2:16 AM 02/05/2021    4:55 AM 01/15/2021    8:04 AM  Depression screen PHQ 2/9  Decreased Interest _0 Down, Depressed, Hopeless _1 PHQ - 2 Score _2 Altered sleeping 2 0 0  Tired, decreased energy 2 1 0  Change in appetite 2 0 0  Feeling bad or failure about yourself  _3 Trouble concentrating 2 1 0  Moving slowly or fidgety/restless 2 0   Suicidal thoughts _4 PHQ-9 Score _5 Difficult doing work/chores Very difficult Extremely dIfficult  Scribe for Treatment Team: Shirl Harris, Marlinda Mike 12/05/2021 4:25 PM

## 2021-12-06 DIAGNOSIS — F332 Major depressive disorder, recurrent severe without psychotic features: Secondary | ICD-10-CM | POA: Diagnosis not present

## 2021-12-06 NOTE — Group Note (Signed)
[facility] LCSW Group Therapy Note   Group Date: 12/06/2021 Start Time: 0950 End Time: 1050   Type of Therapy and Topic: Group Therapy: Avoiding Self-Sabotaging and Enabling Behaviors  Participation Level: Did Not Attend   Description of Group:  In this group, patients will learn how to identify obstacles, self-sabotaging and enabling behaviors, as well as: what are they, why do we do them and what needs these behaviors meet. Discuss unhealthy relationships and how to have positive healthy boundaries with those that sabotage and enable. Explore aspects of self-sabotage and enabling in yourself and how to limit these self-destructive behaviors in everyday life.   Therapeutic Goals: 1. Patient will identify one obstacle that relates to self-sabotage and enabling behaviors 2. Patient will identify one personal self-sabotaging or enabling behavior they did prior to admission 3. Patient will state a plan to change the above identified behavior 4. Patient will demonstrate ability to communicate their needs through discussion and/or role play.    Summary of Patient Progress: X   Therapeutic Modalities:  Cognitive Behavioral Therapy Person-Centered Therapy Motivational Interviewing    Tayvien Kane R Aldina Porta, LCSW 

## 2021-12-06 NOTE — [facility] Suicide Risk Assessment (Signed)
[facility] INPATIENT:  Family/Significant Other Suicide Prevention Education  Suicide Prevention Education:  Patient Refusal for Family/Significant Other Suicide Prevention Education: The patient Nathan Koch has refused to provide written consent for family/significant other to be provided Family/Significant Other Suicide Prevention Education during admission and/or prior to discharge.  Physician notified.  SPE completed with pt, as pt refused to consent to family contact. SPI pamphlet provided to pt and pt was encouraged to share information with support network, ask questions, and talk about any concerns relating to SPE. Pt denies access to guns/firearms and verbalized understanding of information provided. Mobile Crisis information also provided to pt.  Glenis Smoker 12/06/2021, 12:17 PM
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
## 2021-12-06 NOTE — Progress Notes (Signed)
[facility] MD Progress Note  12/06/2021 12:46 PM Nathan Koch  MRN:  [id number]  Principal Problem: Severe recurrent major depression without psychotic features ([place]) Diagnosis: Principal Problem:   Severe recurrent major depression without psychotic features ([place]) Active Problems:   Methamphetamine use disorder, severe ([place])  Patient is a  31y.o. male with history of depression and substance use disorder who presents to the [facility] in the context of suicide attempt.    Interval History Patient was seen today for re-evaluation.  Nursing reports no events overnight. The patient has no issues with performing ADLs.  Patient has been medication compliant.    Subjective:  On assessment patient reports "I am okay". Patient reports feeling "better", although still depressed due to not having a place to live or unclear plan re returning to substance abuse program. Denies feeing suicidal. Denies homicidal ideations. Denies auditory/visual hallucinations. The patient reports no side effects from medications.    Labs: no new results for review.     Total Time spent with patient: 30 minutes  Past Psychiatric History: see H&P  Past Medical History:  Past Medical History:  Diagnosis Date   Anxiety    Anxiety disorder    Asthma    Bipolar 1 disorder ([place])    Depression    Methamphetamine abuse ([place])    PTSD (post-traumatic stress disorder)     Past Surgical History:  Procedure Laterality Date   CLOSED REDUCTION MANDIBLE N/A 07/02/2018   Procedure: CLOSED REDUCTION MANDIBULAR;  Surgeon: Margaretha Sheffield, MD;  Location: [facility] ORS;  Service: ENT;  Laterality: N/A;   Family History:  Family History  Problem Relation Age of Onset   Asthma Mother    Cancer Mother        breast cancer   Ulcers Mother    Cancer Father        esophogeal cancer   Hypertension Father    Asthma Brother    Family Psychiatric  History:  Social History:  Social History   Substance and Sexual Activity  Alcohol Use Not  Currently   Alcohol/week: 13.0 standard drinks of alcohol   Types: 6 Cans of beer, 7 Standard drinks or equivalent per week   Comment: previously heavy drinker 2016. most recent 4 beer/ day drinker and none since 01-11-2020     Social History   Substance and Sexual Activity  Drug Use Yes   Types: Methamphetamines   Comment: states he uses 1 g every other day    Social History   Socioeconomic History   Marital status: Single    Spouse name: Not on file   Number of children: 1   Years of education: 10   Highest education level: 10th grade  Occupational History   Not on file  Tobacco Use   Smoking status: Every Day    Packs/day: 0.50    Years: 8.00    Total pack years: 4.00    Types: Cigarettes   Smokeless tobacco: Never  Vaping Use   Vaping Use: Every day   Substances: Nicotine, Flavoring  Substance and Sexual Activity   Alcohol use: Not Currently    Alcohol/week: 13.0 standard drinks of alcohol    Types: 6 Cans of beer, 7 Standard drinks or equivalent per week    Comment: previously heavy drinker 2016. most recent 4 beer/ day drinker and none since 01-11-2020   Drug use: Yes    Types: Methamphetamines    Comment: states he uses 1 g every other day   Sexual  activity: Not on file  Other Topics Concern   Not on file  Social History Narrative   Not on file   Social Determinants of Health   Financial Resource Strain: Not on file  Food Insecurity: Not on file  Transportation Needs: Not on file  Physical Activity: Not on file  Stress: Not on file  Social Connections: Not on file   Additional Social History:                         Sleep: Fair  Appetite:  Good  Current Medications: Current Facility-Administered Medications  Medication Dose Route Frequency Provider Last Rate Last Admin   acetaminophen (TYLENOL) tablet 650 mg  650 mg Oral Q6H PRN Clapacs, [name], MD       albuterol (VENTOLIN HFA) 108 (90 Base) MCG/ACT inhaler 2 puff  2 puff Inhalation  Q6H PRN Clapacs, Jackquline Denmark, MD   2 puff at 12/05/21 2107   alum & mag hydroxide-simeth (MAALOX/MYLANTA) 200-200-20 MG/5ML suspension 30 mL  30 mL Oral Q4H PRN Clapacs, Jackquline Denmark, MD       hydrOXYzine (ATARAX) tablet 50 mg  50 mg Oral TID PRN Clapacs, Jackquline Denmark, MD   50 mg at 12/05/21 2018   lamoTRIgine (LAMICTAL) tablet 25 mg  25 mg Oral Daily Clapacs, [name], MD   25 mg at 12/06/21 8469   magnesium hydroxide (MILK OF MAGNESIA) suspension 30 mL  30 mL Oral Daily PRN Clapacs, [name], MD       QUEtiapine (SEROQUEL) tablet 100 mg  100 mg Oral QHS Clapacs, [name], MD   100 mg at 12/05/21 2107   sertraline (ZOLOFT) tablet 50 mg  50 mg Oral Daily Clapacs, Jackquline Denmark, MD   50 mg at 12/06/21 6295   traZODone (DESYREL) tablet 100 mg  100 mg Oral QHS PRN Clapacs, Jackquline Denmark, MD   100 mg at 12/05/21 2107    Lab Results: No results found for this or any previous visit (from the past 48 hour(s)).  Blood Alcohol level:  Lab Results  Component Value Date   ETH <10 11/30/2021   ETH <10 04/14/2021    Metabolic Disorder Labs: Lab Results  Component Value Date   HGBA1C 6.0 (H) 01/17/2021   MPG 126 01/17/2021   MPG 126 10/08/2020   No results found for: "PROLACTIN" Lab Results  Component Value Date   CHOL 94 01/17/2021   TRIG 100 01/17/2021   HDL 28 (L) 01/17/2021   CHOLHDL 3.4 01/17/2021   VLDL 20 01/17/2021   LDLCALC 46 01/17/2021   LDLCALC 49 10/08/2020    Physical Findings: AIMS:  , ,  ,  ,    CIWA:    COWS:     Musculoskeletal: Strength & Muscle Tone: within normal limits Gait & Station: normal Patient leans: N/A  Psychiatric Specialty Exam:  Presentation  General Appearance: Appropriate for Environment  Eye Contact:Good  Speech:Clear and Coherent  Speech Volume:Normal  Handedness:Right   Mood and Affect  Mood:Euthymic  Affect:Congruent   Thought Process  Thought Processes:Coherent  Descriptions of Associations:Intact  Orientation:Full (Time, Place and Person)  Thought  Content:Logical  History of Schizophrenia/Schizoaffective disorder:No  Duration of Psychotic Symptoms:N/A  Hallucinations:No data recorded Ideas of Reference:None  Suicidal Thoughts:No data recorded Homicidal Thoughts:No data recorded  Sensorium  Memory:Immediate Good; Recent Good  Judgment:Fair  Insight:Fair   Executive Functions  Concentration:Fair  Attention Span:Good  Recall:Good  Fund of Knowledge:Fair  Language:Fair  Psychomotor Activity  Psychomotor Activity:No data recorded  Assets  Assets:Desire for Improvement; Resilience; Physical Health   Sleep  Sleep:No data recorded   Physical Exam: Physical Exam ROS Blood pressure 105/64, pulse 61, temperature 97.9 F (36.6 C), temperature source Oral, resp. rate 18, height 5\' 9"  (1.753 m), weight 107 kg, SpO2 99 %. Body mass index is 34.85 kg/m.   Treatment Plan Summary: Daily contact with patient to assess and evaluate symptoms and progress in treatment and Medication management  Patient is a 31 year old male with the above-stated past psychiatric history who is seen in follow-up.  Chart reviewed. Patient discussed with nursing. Patient feeling better. Not suicidal. Psych medicines (Seroquel, lamotrigine and hydroxyzine) just restarted - no changes today.    Plan:  -continue inpatient psych admission; 15-minute checks; daily contact with patient to assess and evaluate symptoms and progress in treatment; psychoeducation.  -continue scheduled medications:  lamoTRIgine  25 mg Oral Daily   QUEtiapine  100 mg Oral QHS   sertraline  50 mg Oral Daily    -continue PRN medications.  acetaminophen, albuterol, alum & mag hydroxide-simeth, hydrOXYzine, magnesium hydroxide, traZODone   -Disposition: Patient has already contacted the program and they want to talk to him again on Monday about possibly coming back.  Monday, MD 12/06/2021, 12:46 PM

## 2021-12-06 NOTE — Plan of Care (Signed)
D- Patient alert and oriented. Patient presented in a pleasant mood on assessment stating that he slept "good" last night and had no physical complaints to voice to this Clinical research associate. Patient endorsed both passive SI and depression to this writer, but does not have a plan, nor did he go into detail as to why he's feeling this way. Patient denied HI, AVH, pain and anxiety, reporting that "it's ok for now". Patient's goal for today is to "smile a lot more".  A- Scheduled medications administered to patient, per MD orders. Support and encouragement provided.  Routine safety checks conducted every 15 minutes.  Patient informed to notify staff with problems or concerns.  R- No adverse drug reactions noted. Patient contracts for safety at this time. Patient compliant with medications. Patient receptive, calm, and cooperative. Patient interacts well with others on the unit.  Patient remains safe at this time.  Problem: Education: Goal: Knowledge of General Education information will improve Description: Including pain rating scale, medication(s)/side effects and non-pharmacologic comfort measures Outcome: Progressing   Problem: Health Behavior/Discharge Planning: Goal: Ability to manage health-related needs will improve Outcome: Progressing   Problem: Clinical Measurements: Goal: Ability to maintain clinical measurements within normal limits will improve Outcome: Progressing Goal: Will remain free from infection Outcome: Progressing Goal: Diagnostic test results will improve Outcome: Progressing Goal: Respiratory complications will improve Outcome: Progressing Goal: Cardiovascular complication will be avoided Outcome: Progressing   Problem: Activity: Goal: Risk for activity intolerance will decrease Outcome: Progressing   Problem: Nutrition: Goal: Adequate nutrition will be maintained Outcome: Progressing   Problem: Coping: Goal: Level of anxiety will decrease Outcome: Progressing    Problem: Elimination: Goal: Will not experience complications related to bowel motility Outcome: Progressing Goal: Will not experience complications related to urinary retention Outcome: Progressing   Problem: Pain Managment: Goal: General experience of comfort will improve Outcome: Progressing   Problem: Safety: Goal: Ability to remain free from injury will improve Outcome: Progressing   Problem: Skin Integrity: Goal: Risk for impaired skin integrity will decrease Outcome: Progressing

## 2021-12-06 NOTE — Progress Notes (Signed)
Patient endorses Passive SI denies SI/HI/A/VH and verbally contracted for safety. Compliant with medications. Support and encouragement provided.

## 2021-12-07 DIAGNOSIS — F332 Major depressive disorder, recurrent severe without psychotic features: Secondary | ICD-10-CM | POA: Diagnosis not present

## 2021-12-07 NOTE — Progress Notes (Signed)
Patient complained of SOB  once was given rescue inhaler, he stated he felt better and he complaint with medication regimen. Patient's thoughts are organized and coherent he is interacting appropriately with peers and staff. 15 minutes monitoring maintained.

## 2021-12-07 NOTE — Plan of Care (Signed)
D: Pt alert and oriented. Pt reports experiencing anxiety/depression at this time. Pt denies experiencing any pain at this time. Pt denies experiencing any SI/HI, or AVH at this time.   Pt observed being more present in the milieu this afternoon and evening watching television in the dayroom with others.  A: Scheduled medications administered to pt, per MD orders. Support and encouragement provided. Frequent verbal contact made. Routine safety checks conducted q15 minutes.   R: No adverse drug reactions noted. Pt verbally contracts for safety at this time. Pt compliant with medications and treatment plan. Pt interacts minimally with others on the unit. Pt remains safe at this time. Plan of care is ongoing.   Problem: Education: Goal: Knowledge of General Education information will improve Description: Including pain rating scale, medication(s)/side effects and non-pharmacologic comfort measures Outcome: Progressing   Problem: Nutrition: Goal: Adequate nutrition will be maintained Outcome: Progressing

## 2021-12-07 NOTE — Progress Notes (Signed)
[facility] MD Progress Note  12/07/2021 12:23 PM Nathan Koch  MRN:  [id number]  Principal Problem: Severe recurrent major depression without psychotic features ([name]) Diagnosis: Principal Problem:   Severe recurrent major depression without psychotic features ([name]) Active Problems:   Methamphetamine use disorder, severe ([name])  Patient is a  31y.o. male with history of depression and substance use disorder who presents to the [facility] in the context of suicide attempt.    Interval History Patient was seen today for re-evaluation.  Nursing reports no events overnight. The patient has no issues with performing ADLs.  Patient has been medication compliant.    Subjective:  On assessment patient reports "I am doing better". Patient reports feeling "less depressed" and "not really" suicidal today. Denies homicidal ideations. Denies auditory/visual hallucinations. The patient reports no side effects from medications.Physical complaints - constipation - took stool softener earlier today. He is hoping to be able to return to rehab program.  Labs: no new results for review.     Total Time spent with patient: 30 minutes  Past Psychiatric History: see H&P  Past Medical History:  Past Medical History:  Diagnosis Date   Anxiety    Anxiety disorder    Asthma    Bipolar 1 disorder ([name])    Depression    Methamphetamine abuse ([name])    PTSD (post-traumatic stress disorder)     Past Surgical History:  Procedure Laterality Date   CLOSED REDUCTION MANDIBLE N/A 07/02/2018   Procedure: CLOSED REDUCTION MANDIBULAR;  Surgeon: Vernie Murders, MD;  Location: [facility] ORS;  Service: ENT;  Laterality: N/A;   Family History:  Family History  Problem Relation Age of Onset   Asthma Mother    Cancer Mother        breast cancer   Ulcers Mother    Cancer Father        esophogeal cancer   Hypertension Father    Asthma Brother    Family Psychiatric  History:  Social History:  Social History   Substance and Sexual  Activity  Alcohol Use Not Currently   Alcohol/week: 13.0 standard drinks of alcohol   Types: 6 Cans of beer, 7 Standard drinks or equivalent per week   Comment: previously heavy drinker 2016. most recent 4 beer/ day drinker and none since 01-11-2020     Social History   Substance and Sexual Activity  Drug Use Yes   Types: Methamphetamines   Comment: states he uses 1 g every other day    Social History   Socioeconomic History   Marital status: Single    Spouse name: Not on file   Number of children: 1   Years of education: 10   Highest education level: 10th grade  Occupational History   Not on file  Tobacco Use   Smoking status: Every Day    Packs/day: 0.50    Years: 8.00    Total pack years: 4.00    Types: Cigarettes   Smokeless tobacco: Never  Vaping Use   Vaping Use: Every day   Substances: Nicotine, Flavoring  Substance and Sexual Activity   Alcohol use: Not Currently    Alcohol/week: 13.0 standard drinks of alcohol    Types: 6 Cans of beer, 7 Standard drinks or equivalent per week    Comment: previously heavy drinker 2016. most recent 4 beer/ day drinker and none since 01-11-2020   Drug use: Yes    Types: Methamphetamines    Comment: states he uses 1 g every other day  Sexual activity: Not on file  Other Topics Concern   Not on file  Social History Narrative   Not on file   Social Determinants of Health   Financial Resource Strain: Not on file  Food Insecurity: Not on file  Transportation Needs: Not on file  Physical Activity: Not on file  Stress: Not on file  Social Connections: Not on file   Additional Social History:                         Sleep: Fair  Appetite:  Good  Current Medications: Current Facility-Administered Medications  Medication Dose Route Frequency Provider Last Rate Last Admin   acetaminophen (TYLENOL) tablet 650 mg  650 mg Oral Q6H PRN Clapacs, [name], MD       albuterol (VENTOLIN HFA) 108 (90 Base) MCG/ACT inhaler  2 puff  2 puff Inhalation Q6H PRN Clapacs, Jackquline Denmark, MD   2 puff at 12/07/21 0834   alum & mag hydroxide-simeth (MAALOX/MYLANTA) 200-200-20 MG/5ML suspension 30 mL  30 mL Oral Q4H PRN Clapacs, Jackquline Denmark, MD       hydrOXYzine (ATARAX) tablet 50 mg  50 mg Oral TID PRN Clapacs, Jackquline Denmark, MD   50 mg at 12/05/21 2018   lamoTRIgine (LAMICTAL) tablet 25 mg  25 mg Oral Daily Clapacs, [name], MD   25 mg at 12/07/21 0834   magnesium hydroxide (MILK OF MAGNESIA) suspension 30 mL  30 mL Oral Daily PRN Clapacs, [name], MD   30 mL at 12/07/21 0834   QUEtiapine (SEROQUEL) tablet 100 mg  100 mg Oral QHS Clapacs, [name], MD   100 mg at 12/06/21 2104   sertraline (ZOLOFT) tablet 50 mg  50 mg Oral Daily Clapacs, Jackquline Denmark, MD   50 mg at 12/07/21 0834   traZODone (DESYREL) tablet 100 mg  100 mg Oral QHS PRN Clapacs, Jackquline Denmark, MD   100 mg at 12/06/21 2104    Lab Results: No results found for this or any previous visit (from the past 48 hour(s)).  Blood Alcohol level:  Lab Results  Component Value Date   ETH <10 11/30/2021   ETH <10 04/14/2021    Metabolic Disorder Labs: Lab Results  Component Value Date   HGBA1C 6.0 (H) 01/17/2021   MPG 126 01/17/2021   MPG 126 10/08/2020   No results found for: "PROLACTIN" Lab Results  Component Value Date   CHOL 94 01/17/2021   TRIG 100 01/17/2021   HDL 28 (L) 01/17/2021   CHOLHDL 3.4 01/17/2021   VLDL 20 01/17/2021   LDLCALC 46 01/17/2021   LDLCALC 49 10/08/2020    Physical Findings: AIMS:  , ,  ,  ,    CIWA:    COWS:     Musculoskeletal: Strength & Muscle Tone: within normal limits Gait & Station: normal Patient leans: N/A  Psychiatric Specialty Exam:  Presentation  General Appearance: Appropriate for Environment  Eye Contact:Good  Speech:Clear and Coherent  Speech Volume:Normal  Handedness:Right   Mood and Affect  Mood:Euthymic  Affect:Congruent   Thought Process  Thought Processes:Coherent  Descriptions of  Associations:Intact  Orientation:Full (Time, Place and Person)  Thought Content:Logical  History of Schizophrenia/Schizoaffective disorder:No  Duration of Psychotic Symptoms:N/A  Hallucinations:No data recorded Ideas of Reference:None  Suicidal Thoughts:No data recorded Homicidal Thoughts:No data recorded  Sensorium  Memory:Immediate Good; Recent Good  Judgment:Fair  Insight:Fair   Executive Functions  Concentration:Fair  Attention Span:Good  Recall:Good  Fund of  Knowledge:Fair  Language:Fair   Psychomotor Activity  Psychomotor Activity:No data recorded  Assets  Assets:Desire for Improvement; Resilience; Physical Health   Sleep  Sleep:No data recorded   Physical Exam: Physical Exam ROS Blood pressure 111/68, pulse (!) 58, temperature 97.8 F (36.6 C), temperature source Oral, resp. rate 18, height 5\' 9"  (1.753 m), weight 107 kg, SpO2 99 %. Body mass index is 34.85 kg/m.   Treatment Plan Summary: Daily contact with patient to assess and evaluate symptoms and progress in treatment and Medication management  Patient is a 31 year old male with the above-stated past psychiatric history who is seen in follow-up.  Chart reviewed. Patient discussed with nursing. Patient feeling better. Not suicidal. Psych medicines (Seroquel, lamotrigine and hydroxyzine) restarted recently - no changes today.    Plan:  -continue inpatient psych admission; 15-minute checks; daily contact with patient to assess and evaluate symptoms and progress in treatment; psychoeducation.  -continue scheduled medications:  lamoTRIgine  25 mg Oral Daily   QUEtiapine  100 mg Oral QHS   sertraline  50 mg Oral Daily    -continue PRN medications.  acetaminophen, albuterol, alum & mag hydroxide-simeth, hydrOXYzine, magnesium hydroxide, traZODone   -Disposition: Patient has already contacted the program and they want to talk to him again on Monday about possibly coming back.  Sunday, MD 12/07/2021, 12:23 PM

## 2021-12-08 DIAGNOSIS — F332 Major depressive disorder, recurrent severe without psychotic features: Secondary | ICD-10-CM | POA: Diagnosis not present

## 2021-12-08 NOTE — BHH Counselor (Signed)
CSW gave pt [employer] per request. Pt informed CSW that he had contacted [facility] but was not sure about them anymore so he wanted to call [employer]. He went on to state that he had contacted [facility] because he had been there in the past and would like to return there. CSW informed pt that he could contact them on pt's regard. Pt agreed. No other concerns expressed. Contact ended without incident.   Pt came to the nurses' station and asked to speak with CSW. He updated CSW that he has an interview scheduled with an [employer] on Sunday. He asked what CSW thought about that. CSW informed pt that it would not hurt to keep contacting [facility] in the mean time, while he waits for this interview on Sunday. CSW stated that this way he could have more than just one option on the table. Pt agreed. No other concerns expressed. Contact ended without incident.   CSW phone [facility] CSW was informed that pt would not follow instructions while in their program. Natalia Leatherwood shared that they struggled with getting him to even make his bed. She went on to state that after a while, pt never came back to the house. Natalia Leatherwood stated that she does not believe that the pt is a good fit for their program. No other concerns expressed. Contact ended without incident.   CSW updated pt about phone conversation with [facility] He voiced understanding. CSW encouraged pt to continue contacting [facility] and that CSW would contact other programs regarding availability. He agreed. No other concerns expressed. Contact ended without incident.   Vilma Meckel. Algis Greenhouse, MSW, LCSW, LCAS 12/08/2021 3:50 PM

## 2021-12-08 NOTE — Group Note (Signed)
LCSW Group Therapy Note   Group Date: 12/08/2021 Start Time: 1300 End Time: 1400   Type of Therapy and Topic:  Group Therapy: Anger Cues and Responses  Participation Level:  Active   Description of Group:   In this group, patients learned how to recognize the physical, cognitive, emotional, and behavioral responses they have to anger-provoking situations.  They identified a recent time they became angry and how they reacted.  They analyzed how their reaction was possibly beneficial and how it was possibly unhelpful.  The group discussed a variety of healthier coping skills that could help with such a situation in the future.  Focus was placed on how helpful it is to recognize the underlying emotions to our anger, because working on those can lead to a more permanent solution as well as our ability to focus on the important rather than the urgent.  Therapeutic Goals: Patients will remember their last incident of anger and how they felt emotionally and physically, what their thoughts were at the time, and how they behaved. Patients will identify how their behavior at that time worked for them, as well as how it worked against them. Patients will explore possible new behaviors to use in future anger situations. Patients will learn that anger itself is normal and cannot be eliminated, and that healthier reactions can assist with resolving conflict rather than worsening situations.  Summary of Patient Progress:  Pt was present/active throughout the session and proved open to feedback from CSW and peers. Patient demonstrated good insight into the subject matter, was respectful of peers, and was present and engaged throughout the entire session.  Therapeutic Modalities:   Cognitive Behavioral Therapy  Rogene Houston, LCSW 12/08/2021  7:01 PM

## 2021-12-08 NOTE — Progress Notes (Signed)
No distress noted denies SI/HI/AVH, his thoughts are organized and coherent and he is complaint with medication. 15 minutes safety checks maintained.

## 2021-12-08 NOTE — Plan of Care (Signed)
D: Pt alert and oriented. Pt reports experiencing anxiety/depression at this time. Pt denies experiencing any pain at this time. Pt denies experiencing any SI/HI, or AVH at this time.   Pt attended group this afternoon. Pt has been to the nurse's station multiple times this evening to speak with nursing staff as well as social work. Pt has asked for a list of [place] [facility] in which social work supplied pt with.   A: Scheduled medications administered to pt, per MD orders. Support and encouragement provided. Frequent verbal contact made. Routine safety checks conducted q15 minutes.   R: No adverse drug reactions noted. Pt verbally contracts for safety at this time. Pt compliant with medications and treatment plan. Pt interacts well but minimally with others on the unit. Pt remains safe at this time. Plan of care ongoing.   Problem: Activity: Goal: Risk for activity intolerance will decrease Outcome: Progressing   Problem: Nutrition: Goal: Adequate nutrition will be maintained Outcome: Progressing

## 2021-12-08 NOTE — Group Note (Signed)
[facility] LCSW Group Therapy Note    Group Date: 12/08/2021 Start Time: 1300 End Time: 1400  Type of Therapy and Topic:  Group Therapy:  Overcoming Obstacles  Participation Level:  [facility] PARTICIPATION LEVEL: Active   Description of Group:   In this group patients will be encouraged to explore what they see as obstacles to their own wellness and recovery. They will be guided to discuss their thoughts, feelings, and behaviors related to these obstacles. The group will process together ways to cope with barriers, with attention given to specific choices patients can make. Each patient will be challenged to identify changes they are motivated to make in order to overcome their obstacles. This group will be process-oriented, with patients participating in exploration of their own experiences as well as giving and receiving support and challenge from other group members.  Therapeutic Goals: 1. Patient will identify personal and current obstacles as they relate to admission. 2. Patient will identify barriers that currently interfere with their wellness or overcoming obstacles.  3. Patient will identify feelings, thought process and behaviors related to these barriers. 4. Patient will identify two changes they are willing to make to overcome these obstacles:    Summary of Patient Progress Patient was present for the entirety of the group process. He identified addiction at the obstacle that he needs to overcome. Pt also acknowledged that fear of success and the responsibility that it brings is something that keeps him from overcoming his addiction. He went on to state that when he is in a dangerous mental place, he does not reach out to his sponsor or support network (AA) to ask for help. Pt ultimately stated that he is too stubborn. He endorsed having a sponsor, AA home group, and SAIOP. Pt states that he needs to get out of his own way and let his high power  take over in his life. He was open and receptive to feedback/comments from both his peer and facilitator.    Therapeutic Modalities:   Cognitive Behavioral Therapy Solution Focused Therapy Motivational Interviewing Relapse Prevention Therapy   Glenis Smoker, LCSW

## 2021-12-08 NOTE — Progress Notes (Signed)
Recreation Therapy Notes  Date: 12/08/2021  Time: 10:45 am    Location: Courtyard    Behavioral response: N/A   Intervention Topic: Wellness   Discussion/Intervention: Patient refused to attend group.   Clinical Observations/Feedback:  Patient refused to attend group.    Romel Dumond LRT/CTRS        Lorrene Graef 12/08/2021 12:11 PM

## 2021-12-08 NOTE — Progress Notes (Signed)
[facility] MD Progress Note  12/08/2021 3:25 PM Nathan Koch  MRN:  [id number] Subjective: Follow-up for this 31 year old man with substance abuse and depression.  Patient had a very impulsive overdose before coming to the psych ward.  Since being down here his behavior has been fine.  He denies suicidal ideation.  Says his mood is feeling better.  He feels more relaxed and forward thinking knowing that we are looking for some kind of substance abuse program.  He called the place where he had previously been in [facility] and apparently was told he needed "a higher level of care". Principal Problem: Severe recurrent major depression without psychotic features ([name]) Diagnosis: Principal Problem:   Severe recurrent major depression without psychotic features ([name]) Active Problems:   Methamphetamine use disorder, severe ([name])  Total Time spent with patient: 30 minutes  Past Psychiatric History: Past history of substance abuse mood instability and impulsive behavior  Past Medical History:  Past Medical History:  Diagnosis Date   Anxiety    Anxiety disorder    Asthma    Bipolar 1 disorder ([name])    Depression    Methamphetamine abuse ([name])    PTSD (post-traumatic stress disorder)     Past Surgical History:  Procedure Laterality Date   CLOSED REDUCTION MANDIBLE N/A 07/02/2018   Procedure: CLOSED REDUCTION MANDIBULAR;  Surgeon: Vernie Murders, MD;  Location: [facility] ORS;  Service: ENT;  Laterality: N/A;   Family History:  Family History  Problem Relation Age of Onset   Asthma Mother    Cancer Mother        breast cancer   Ulcers Mother    Cancer Father        esophogeal cancer   Hypertension Father    Asthma Brother    Family Psychiatric  History: See previous Social History:  Social History   Substance and Sexual Activity  Alcohol Use Not Currently   Alcohol/week: 13.0 standard drinks of alcohol   Types: 6 Cans of beer, 7 Standard drinks or equivalent per week   Comment: previously  heavy drinker 2016. most recent 4 beer/ day drinker and none since 01-11-2020     Social History   Substance and Sexual Activity  Drug Use Yes   Types: Methamphetamines   Comment: states he uses 1 g every other day    Social History   Socioeconomic History   Marital status: Single    Spouse name: Not on file   Number of children: 1   Years of education: 10   Highest education level: 10th grade  Occupational History   Not on file  Tobacco Use   Smoking status: Every Day    Packs/day: 0.50    Years: 8.00    Total pack years: 4.00    Types: Cigarettes   Smokeless tobacco: Never  Vaping Use   Vaping Use: Every day   Substances: Nicotine, Flavoring  Substance and Sexual Activity   Alcohol use: Not Currently    Alcohol/week: 13.0 standard drinks of alcohol    Types: 6 Cans of beer, 7 Standard drinks or equivalent per week    Comment: previously heavy drinker 2016. most recent 4 beer/ day drinker and none since 01-11-2020   Drug use: Yes    Types: Methamphetamines    Comment: states he uses 1 g every other day   Sexual activity: Not on file  Other Topics Concern   Not on file  Social History Narrative   Not on file  Social Determinants of Health   Financial Resource Strain: Not on file  Food Insecurity: Not on file  Transportation Needs: Not on file  Physical Activity: Not on file  Stress: Not on file  Social Connections: Not on file   Additional Social History:                         Sleep: Fair  Appetite:  Fair  Current Medications: Current Facility-Administered Medications  Medication Dose Route Frequency Provider Last Rate Last Admin   acetaminophen (TYLENOL) tablet 650 mg  650 mg Oral Q6H PRN Jehad Bisono T, MD       albuterol (VENTOLIN HFA) 108 (90 Base) MCG/ACT inhaler 2 puff  2 puff Inhalation Q6H PRN Malvern Kadlec T, MD   2 puff at 12/08/21 1143   alum & mag hydroxide-simeth (MAALOX/MYLANTA) 200-200-20 MG/5ML suspension 30 mL  30 mL Oral  Q4H PRN Chai Routh, Jackquline Denmark, MD       hydrOXYzine (ATARAX) tablet 50 mg  50 mg Oral TID PRN Lanetta Figuero, Jackquline Denmark, MD   50 mg at 12/07/21 2002   lamoTRIgine (LAMICTAL) tablet 25 mg  25 mg Oral Daily Pheonix Clinkscale T, MD   25 mg at 12/08/21 0758   magnesium hydroxide (MILK OF MAGNESIA) suspension 30 mL  30 mL Oral Daily PRN Germain Koopmann T, MD   30 mL at 12/07/21 0834   QUEtiapine (SEROQUEL) tablet 100 mg  100 mg Oral QHS Laiyla Slagel T, MD   100 mg at 12/07/21 2106   sertraline (ZOLOFT) tablet 50 mg  50 mg Oral Daily Thadd Apuzzo, Jackquline Denmark, MD   50 mg at 12/08/21 0758   traZODone (DESYREL) tablet 100 mg  100 mg Oral QHS PRN Merrin Mcvicker, Jackquline Denmark, MD   100 mg at 12/07/21 2107    Lab Results: No results found for this or any previous visit (from the past 48 hour(s)).  Blood Alcohol level:  Lab Results  Component Value Date   ETH <10 11/30/2021   ETH <10 04/14/2021    Metabolic Disorder Labs: Lab Results  Component Value Date   HGBA1C 6.0 (H) 01/17/2021   MPG 126 01/17/2021   MPG 126 10/08/2020   No results found for: "PROLACTIN" Lab Results  Component Value Date   CHOL 94 01/17/2021   TRIG 100 01/17/2021   HDL 28 (L) 01/17/2021   CHOLHDL 3.4 01/17/2021   VLDL 20 01/17/2021   LDLCALC 46 01/17/2021   LDLCALC 49 10/08/2020    Physical Findings: AIMS:  , ,  ,  ,    CIWA:    COWS:     Musculoskeletal: Strength & Muscle Tone: within normal limits Gait & Station: normal Patient leans: N/A  Psychiatric Specialty Exam:  Presentation  General Appearance: Appropriate for Environment  Eye Contact:Good  Speech:Clear and Coherent  Speech Volume:Normal  Handedness:Right   Mood and Affect  Mood:Euthymic  Affect:Congruent   Thought Process  Thought Processes:Coherent  Descriptions of Associations:Intact  Orientation:Full (Time, Place and Person)  Thought Content:Logical  History of Schizophrenia/Schizoaffective disorder:No  Duration of Psychotic Symptoms:N/A  Hallucinations:No  data recorded Ideas of Reference:None  Suicidal Thoughts:No data recorded Homicidal Thoughts:No data recorded  Sensorium  Memory:Immediate Good; Recent Good  Judgment:Fair  Insight:Fair   Executive Functions  Concentration:Fair  Attention Span:Good  Recall:Good  Fund of Knowledge:Fair  Language:Fair   Psychomotor Activity  Psychomotor Activity:No data recorded  Assets  Assets:Desire for Improvement; Resilience; Physical Health   Sleep  Sleep:No data recorded   Physical Exam: Physical Exam Vitals and nursing note reviewed.  Constitutional:      Appearance: Normal appearance.  HENT:     Head: Normocephalic and atraumatic.     Mouth/Throat:     Pharynx: Oropharynx is clear.  Eyes:     Pupils: Pupils are equal, round, and reactive to light.  Cardiovascular:     Rate and Rhythm: Normal rate and regular rhythm.  Pulmonary:     Effort: Pulmonary effort is normal.     Breath sounds: Normal breath sounds.  Abdominal:     General: Abdomen is flat.     Palpations: Abdomen is soft.  Musculoskeletal:        General: Normal range of motion.  Skin:    General: Skin is warm and dry.  Neurological:     General: No focal deficit present.     Mental Status: He is alert. Mental status is at baseline.  Psychiatric:        Attention and Perception: Attention normal.        Mood and Affect: Mood normal.        Speech: Speech normal.        Behavior: Behavior normal.        Thought Content: Thought content normal.        Cognition and Memory: Cognition normal.        Judgment: Judgment normal.    Review of Systems  Constitutional: Negative.   HENT: Negative.    Eyes: Negative.   Respiratory: Negative.    Cardiovascular: Negative.   Gastrointestinal: Negative.   Musculoskeletal: Negative.   Skin: Negative.   Neurological: Negative.   Psychiatric/Behavioral: Negative.     Blood pressure 105/63, pulse 65, temperature 97.6 F (36.4 C), temperature source  Oral, resp. rate 18, height 5\' 9"  (1.753 m), weight 107 kg, SpO2 97 %. Body mass index is 34.85 kg/m.   Treatment Plan Summary: Medication management and Plan of course where he is right now is a "higher level of care" but I doubt that is going to get him back into the place in [facility].  Staff person helping him to search for other rehab options no indication to change medicine  [facility], MD 12/08/2021, 3:25 PM

## 2021-12-08 NOTE — Progress Notes (Signed)
Pt calm and pleasant during assessment denying SI/HI/AVH. Pt observed interacting appropriately with staff and peers on the unit. Pt compliant with medication administration per MD orders. Pt given education, support, and encouragement to be active in his treatment plan. Pt being monitored Q 15 minutes for safety per unit protocol, remains safe on the unit.

## 2021-12-09 DIAGNOSIS — F332 Major depressive disorder, recurrent severe without psychotic features: Secondary | ICD-10-CM | POA: Diagnosis not present

## 2021-12-09 NOTE — Plan of Care (Signed)
D: Pt alert and oriented. Pt denies experiencing any anxiety/depression at this time. Pt reports experiencing 5/10 headache pain at this time, prn meds offered and refused. Pt reports experiencing any SI/HI, or AVH at this time.   A: Scheduled medications administered to pt, per MD orders. Support and encouragement provided. Frequent verbal contact made. Routine safety checks conducted q15 minutes.   R: No adverse drug reactions noted. Pt verbally contracts for safety at this time. Pt compliant with medications and treatment plan. Pt interacts well with others on the unit. Pt remains safe at this time. Plan of care ongoing.   Problem: Education: Goal: Knowledge of General Education information will improve Description: Including pain rating scale, medication(s)/side effects and non-pharmacologic comfort measures Outcome: Progressing   Problem: Coping: Goal: Level of anxiety will decrease Outcome: Progressing

## 2021-12-09 NOTE — Progress Notes (Signed)
Pt calm and pleasant during assessment denying SI/HI/AVH. Pt observed interacting appropriately with staff and peers on the unit. Pt compliant with medication administration per MD orders. Pt given education, support, and encouragement to be active in his treatment plan. Pt being monitored Q 15 minutes for safety per unit protocol, remains safe on the unit. 

## 2021-12-09 NOTE — Progress Notes (Signed)
[facility] MD Progress Note  12/09/2021 11:21 AM Nathan Koch  MRN:  [id number] Subjective: Follow-up 32 year old man with depression and substance abuse.  Patient says he is feeling a little better today.  Has some plans to call [place] [facility] and make other attempts at finding a place to live today.  No active suicidal thoughts and no physical complaints Principal Problem: Severe recurrent major depression without psychotic features ([name]) Diagnosis: Principal Problem:   Severe recurrent major depression without psychotic features ([name]) Active Problems:   Methamphetamine use disorder, severe ([name])  Total Time spent with patient: 30 minutes  Past Psychiatric History: Past history of substance abuse and depression  Past Medical History:  Past Medical History:  Diagnosis Date   Anxiety    Anxiety disorder    Asthma    Bipolar 1 disorder ([name])    Depression    Methamphetamine abuse ([name])    PTSD (post-traumatic stress disorder)     Past Surgical History:  Procedure Laterality Date   CLOSED REDUCTION MANDIBLE N/A 07/02/2018   Procedure: CLOSED REDUCTION MANDIBULAR;  Surgeon: Vernie Murders, MD;  Location: [facility] ORS;  Service: ENT;  Laterality: N/A;   Family History:  Family History  Problem Relation Age of Onset   Asthma Mother    Cancer Mother        breast cancer   Ulcers Mother    Cancer Father        esophogeal cancer   Hypertension Father    Asthma Brother    Family Psychiatric  History: See previous Social History:  Social History   Substance and Sexual Activity  Alcohol Use Not Currently   Alcohol/week: 13.0 standard drinks of alcohol   Types: 6 Cans of beer, 7 Standard drinks or equivalent per week   Comment: previously heavy drinker 2016. most recent 4 beer/ day drinker and none since 01-11-2020     Social History   Substance and Sexual Activity  Drug Use Yes   Types: Methamphetamines   Comment: states he uses 1 g every other day    Social History    Socioeconomic History   Marital status: Single    Spouse name: Not on file   Number of children: 1   Years of education: 10   Highest education level: 10th grade  Occupational History   Not on file  Tobacco Use   Smoking status: Every Day    Packs/day: 0.50    Years: 8.00    Total pack years: 4.00    Types: Cigarettes   Smokeless tobacco: Never  Vaping Use   Vaping Use: Every day   Substances: Nicotine, Flavoring  Substance and Sexual Activity   Alcohol use: Not Currently    Alcohol/week: 13.0 standard drinks of alcohol    Types: 6 Cans of beer, 7 Standard drinks or equivalent per week    Comment: previously heavy drinker 2016. most recent 4 beer/ day drinker and none since 01-11-2020   Drug use: Yes    Types: Methamphetamines    Comment: states he uses 1 g every other day   Sexual activity: Not on file  Other Topics Concern   Not on file  Social History Narrative   Not on file   Social Determinants of Health   Financial Resource Strain: Not on file  Food Insecurity: Not on file  Transportation Needs: Not on file  Physical Activity: Not on file  Stress: Not on file  Social Connections: Not on file   Additional Social History:  Sleep: Fair  Appetite:  Fair  Current Medications: Current Facility-Administered Medications  Medication Dose Route Frequency Provider Last Rate Last Admin   acetaminophen (TYLENOL) tablet 650 mg  650 mg Oral Q6H PRN Buddie Marston T, MD       albuterol (VENTOLIN HFA) 108 (90 Base) MCG/ACT inhaler 2 puff  2 puff Inhalation Q6H PRN Kriston Pasquarello, [name], MD   2 puff at 12/09/21 0810   alum & mag hydroxide-simeth (MAALOX/MYLANTA) 200-200-20 MG/5ML suspension 30 mL  30 mL Oral Q4H PRN Jaelyne Deeg, [name], MD       hydrOXYzine (ATARAX) tablet 50 mg  50 mg Oral TID PRN Abdulaziz Toman, [name], MD   50 mg at 12/07/21 2002   lamoTRIgine (LAMICTAL) tablet 25 mg  25 mg Oral Daily Dante Cooter T, MD   25 mg at 12/09/21 0809    magnesium hydroxide (MILK OF MAGNESIA) suspension 30 mL  30 mL Oral Daily PRN Mitul Hallowell T, MD   30 mL at 12/07/21 0834   QUEtiapine (SEROQUEL) tablet 100 mg  100 mg Oral QHS Mahika Vanvoorhis T, MD   100 mg at 12/08/21 2109   sertraline (ZOLOFT) tablet 50 mg  50 mg Oral Daily Timber Marshman, [name], MD   50 mg at 12/09/21 0809   traZODone (DESYREL) tablet 100 mg  100 mg Oral QHS PRN Javyn Havlin, [name], MD   100 mg at 12/08/21 2109    Lab Results: No results found for this or any previous visit (from the past 24 hour(s)).  Blood Alcohol level:  Lab Results  Component Value Date   ETH <10 11/30/2021   ETH <10 123XX123    Metabolic Disorder Labs: Lab Results  Component Value Date   HGBA1C 6.0 (H) 01/17/2021   MPG 126 01/17/2021   MPG 126 10/08/2020   No results found for: "PROLACTIN" Lab Results  Component Value Date   CHOL 94 01/17/2021   TRIG 100 01/17/2021   HDL 28 (L) 01/17/2021   CHOLHDL 3.4 01/17/2021   VLDL 20 01/17/2021   LDLCALC 46 01/17/2021   LDLCALC 49 10/08/2020    Physical Findings: AIMS:  , ,  ,  ,    CIWA:    COWS:     Musculoskeletal: Strength & Muscle Tone: within normal limits Gait & Station: normal Patient leans: N/A  Psychiatric Specialty Exam:  Presentation  General Appearance: Appropriate for Environment  Eye Contact:Good  Speech:Clear and Coherent  Speech Volume:Normal  Handedness:Right   Mood and Affect  Mood:Euthymic  Affect:Congruent   Thought Process  Thought Processes:Coherent  Descriptions of Associations:Intact  Orientation:Full (Time, Place and Person)  Thought Content:Logical  History of Schizophrenia/Schizoaffective disorder:No  Duration of Psychotic Symptoms:N/A  Hallucinations:No data recorded Ideas of Reference:None  Suicidal Thoughts:No data recorded Homicidal Thoughts:No data recorded  Sensorium  Memory:Immediate Good; Recent Good  Judgment:Fair  Insight:Fair   Executive Functions   Concentration:Fair  Attention Span:Good  Recall:Good  Fund of Knowledge:Fair  Language:Fair   Psychomotor Activity  Psychomotor Activity:No data recorded  Assets  Assets:Desire for Improvement; Resilience; Physical Health   Sleep  Sleep:No data recorded   Physical Exam: Physical Exam Vitals and nursing note reviewed.  Constitutional:      Appearance: Normal appearance.  HENT:     Head: Normocephalic and atraumatic.     Mouth/Throat:     Pharynx: Oropharynx is clear.  Eyes:     Pupils: Pupils are equal, round, and reactive to light.  Cardiovascular:     Rate and  Rhythm: Normal rate and regular rhythm.  Pulmonary:     Effort: Pulmonary effort is normal.     Breath sounds: Normal breath sounds.  Abdominal:     General: Abdomen is flat.     Palpations: Abdomen is soft.  Musculoskeletal:        General: Normal range of motion.  Skin:    General: Skin is warm and dry.  Neurological:     General: No focal deficit present.     Mental Status: He is alert. Mental status is at baseline.  Psychiatric:        Attention and Perception: Attention normal.        Mood and Affect: Mood normal.        Speech: Speech normal.        Behavior: Behavior normal.        Thought Content: Thought content normal.        Cognition and Memory: Cognition normal.    Review of Systems  Constitutional: Negative.   HENT: Negative.    Eyes: Negative.   Respiratory: Negative.    Cardiovascular: Negative.   Gastrointestinal: Negative.   Musculoskeletal: Negative.   Skin: Negative.   Neurological: Negative.   Psychiatric/Behavioral: Negative.     Blood pressure (!) 115/54, pulse 65, temperature 98 F (36.7 C), temperature source Oral, resp. rate 20, height 5\' 9"  (1.753 m), weight 107 kg, SpO2 100 %. Body mass index is 34.85 kg/m.   Treatment Plan Summary: Plan continue medication.  Encourage group attendance.  Team is working on looking for options for substance abuse rehab  treatment  , MD 12/09/2021, 11:21 AM

## 2021-12-09 NOTE — Progress Notes (Signed)
Recreation Therapy Notes  Date: 12/09/2021   Time: 10:25 am     Location: Craft room    Behavioral response: N/A   Intervention Topic: Time Management   Discussion/Intervention: Patient refused to attend group.    Clinical Observations/Feedback:  Patient refused to attend group.    Katy Brickell LRT/CTRS        Lateasha Breuer 12/09/2021 11:23 AM 

## 2021-12-10 DIAGNOSIS — F332 Major depressive disorder, recurrent severe without psychotic features: Secondary | ICD-10-CM | POA: Diagnosis not present

## 2021-12-10 NOTE — Group Note (Signed)
[facility] LCSW Group Therapy Note   Group Date: 12/10/2021 Start Time: 1330 End Time: 1430   Type of Therapy/Topic:  Group Therapy:  Emotion Regulation  Participation Level:  Active    Description of Group:    The purpose of this group is to assist patients in learning to regulate negative emotions and experience positive emotions. Patients will be guided to discuss ways in which they have been vulnerable to their negative emotions. These vulnerabilities will be juxtaposed with experiences of positive emotions or situations, and patients challenged to use positive emotions to combat negative ones. Special emphasis will be placed on coping with negative emotions in conflict situations, and patients will process healthy conflict resolution skills.  Therapeutic Goals: Patient will identify two positive emotions or experiences to reflect on in order to balance out negative emotions:  Patient will label two or more emotions that they find the most difficult to experience:  Patient will be able to demonstrate positive conflict resolution skills through discussion or role plays:   Summary of Patient Progress: Patient was present for the entirety of the group process. He identified feelings of happiness/gladness upon to admission but this was because he had almost died. Pt was able to identify some physical symptoms that he associates with strong emotions that he has. He shared that he got upset prior to admission because he was not able to see his daughter which lead him to become suicidal. However, pt states that he would not change anything because he learns from his mistakes. He went on to share that three months ago he was thinking about using again but instead he reached out to his sponsor which "deaded" that issue. Pt identified completing word puzzles and exercise as something that helps him cope with negative emotions. Pt was open and receptive to feedback/comments from peers and  facilitator.   Therapeutic Modalities:   Cognitive Behavioral Therapy Feelings Identification Dialectical Behavioral Therapy   Glenis Smoker, LCSW

## 2021-12-10 NOTE — Progress Notes (Signed)
Pt calm and pleasant during assessment denying SI/HI/AVH. Pt observed interacting appropriately with staff and peers on the unit. Pt compliant with medication administration per MD orders. Pt given education, support, and encouragement to be active in his treatment plan. Pt being monitored Q 15 minutes for safety per unit protocol, remains safe on the unit. Pt got accepted into [facility] and is hopeful to D/C tomorrow

## 2021-12-10 NOTE — BH IP Treatment Plan (Signed)
Interdisciplinary Treatment and Diagnostic Plan Update  12/10/2021 Time of Session: 8:30 AM Nathan Koch MRN: [id number]  Principal Diagnosis: Severe recurrent major depression without psychotic features ([name])  Secondary Diagnoses: Principal Problem:   Severe recurrent major depression without psychotic features ([name]) Active Problems:   Methamphetamine use disorder, severe ([name])   Current Medications:  Current Facility-Administered Medications  Medication Dose Route Frequency Provider Last Rate Last Admin   acetaminophen (TYLENOL) tablet 650 mg  650 mg Oral Q6H PRN Clapacs, [name], MD       albuterol (VENTOLIN HFA) 108 (90 Base) MCG/ACT inhaler 2 puff  2 puff Inhalation Q6H PRN Clapacs, [name], MD   2 puff at 12/09/21 0810   alum & mag hydroxide-simeth (MAALOX/MYLANTA) 200-200-20 MG/5ML suspension 30 mL  30 mL Oral Q4H PRN Clapacs, Jackquline Denmark, MD       hydrOXYzine (ATARAX) tablet 50 mg  50 mg Oral TID PRN Clapacs, Jackquline Denmark, MD   50 mg at 12/09/21 2107   lamoTRIgine (LAMICTAL) tablet 25 mg  25 mg Oral Daily Clapacs, [name], MD   25 mg at 12/10/21 0813   magnesium hydroxide (MILK OF MAGNESIA) suspension 30 mL  30 mL Oral Daily PRN Clapacs, [name], MD   30 mL at 12/07/21 0834   QUEtiapine (SEROQUEL) tablet 100 mg  100 mg Oral QHS Clapacs, [name], MD   100 mg at 12/09/21 2107   sertraline (ZOLOFT) tablet 50 mg  50 mg Oral Daily Clapacs, Jackquline Denmark, MD   50 mg at 12/10/21 3710   traZODone (DESYREL) tablet 100 mg  100 mg Oral QHS PRN Clapacs, [name], MD   100 mg at 12/09/21 2107   PTA Medications: Medications Prior to Admission  Medication Sig Dispense Refill Last Dose   albuterol (VENTOLIN HFA) 108 (90 Base) MCG/ACT inhaler Inhale 1-2 puffs into the lungs every 4 (four) hours as needed for wheezing or shortness of breath. 6.7 g 0 12/04/2021   hydrOXYzine (ATARAX) 25 MG tablet Take 1 tablet (25 mg total) by mouth 3 (three) times daily as needed for anxiety. 90 tablet 0 12/04/2021   sertraline  (ZOLOFT) 50 MG tablet Take 1 tablet (50 mg total) by mouth at bedtime. 30 tablet 0 12/04/2021   nicotine (NICODERM CQ - DOSED IN MG/24 HOURS) 14 mg/24hr patch Place 1 patch (14 mg total) onto the skin daily. 14 patch 0     Patient Stressors:    Patient Strengths:    Treatment Modalities: Medication Management, Group therapy, Case management,  1 to 1 session with clinician, Psychoeducation, Recreational therapy.   Physician Treatment Plan for Primary Diagnosis: Severe recurrent major depression without psychotic features ([name]) Long Term Goal(s): Improvement in symptoms so as ready for discharge   Short Term Goals: Compliance with prescribed medications will improve Ability to identify triggers associated with substance abuse/mental health issues will improve Ability to disclose and discuss suicidal ideas Ability to demonstrate self-control will improve  Medication Management: Evaluate patient's response, side effects, and tolerance of medication regimen.  Therapeutic Interventions: 1 to 1 sessions, Unit Group sessions and Medication administration.  Evaluation of Outcomes: Progressing  Physician Treatment Plan for Secondary Diagnosis: Principal Problem:   Severe recurrent major depression without psychotic features ([name]) Active Problems:   Methamphetamine use disorder, severe ([name])  Long Term Goal(s): Improvement in symptoms so as ready for discharge   Short Term Goals: Compliance with prescribed medications will improve Ability to identify triggers associated with substance abuse/mental health issues  will improve Ability to disclose and discuss suicidal ideas Ability to demonstrate self-control will improve     Medication Management: Evaluate patient's response, side effects, and tolerance of medication regimen.  Therapeutic Interventions: 1 to 1 sessions, Unit Group sessions and Medication administration.  Evaluation of Outcomes: Progressing   RN Treatment Plan for  Primary Diagnosis: Severe recurrent major depression without psychotic features ([name]) Long Term Goal(s): Knowledge of disease and therapeutic regimen to maintain health will improve  Short Term Goals: Ability to remain free from injury will improve, Ability to verbalize frustration and anger appropriately will improve, Ability to demonstrate self-control, Ability to participate in decision making will improve, Ability to verbalize feelings will improve, Ability to disclose and discuss suicidal ideas, Ability to identify and develop effective coping behaviors will improve, and Compliance with prescribed medications will improve  Medication Management: RN will administer medications as ordered by provider, will assess and evaluate patient's response and provide education to patient for prescribed medication. RN will report any adverse and/or side effects to prescribing provider.  Therapeutic Interventions: 1 on 1 counseling sessions, Psychoeducation, Medication administration, Evaluate responses to treatment, Monitor vital signs and CBGs as ordered, Perform/monitor CIWA, COWS, AIMS and Fall Risk screenings as ordered, Perform wound care treatments as ordered.  Evaluation of Outcomes: Progressing   LCSW Treatment Plan for Primary Diagnosis: Severe recurrent major depression without psychotic features ([name]) Long Term Goal(s): Safe transition to appropriate next level of care at discharge, Engage patient in therapeutic group addressing interpersonal concerns.  Short Term Goals: Engage patient in aftercare planning with referrals and resources, Increase social support, Increase ability to appropriately verbalize feelings, Increase emotional regulation, Facilitate acceptance of mental health diagnosis and concerns, Facilitate patient progression through stages of change regarding substance use diagnoses and concerns, Identify triggers associated with mental health/substance abuse issues, and Increase skills  for wellness and recovery  Therapeutic Interventions: Assess for all discharge needs, 1 to 1 time with Social worker, Explore available resources and support systems, Assess for adequacy in community support network, Educate family and significant other(s) on suicide prevention, Complete Psychosocial Assessment, Interpersonal group therapy.  Evaluation of Outcomes: Progressing   Progress in Treatment: Attending groups: Yes. Participating in groups: Yes. Taking medication as prescribed: Yes. Toleration medication: Yes. Family/Significant other contact made: No, will contact:  if given permission.  Patient understands diagnosis: Yes. Discussing patient identified problems/goals with staff: Yes. Medical problems stabilized or resolved: Yes. Denies suicidal/homicidal ideation: Yes. Issues/concerns per patient self-inventory: No. Other: none.  New problem(s) identified: No, Describe:  none identified. Update 12/10/21: No changes at this time.   New Short Term/Long Term Goal(s): detox, medication management for mood stabilization; elimination of SI thoughts; development of comprehensive mental wellness/sobriety plan. Update 12/10/21: No changes at this time.   Patient Goals:  "My goal is to get back into treatment and really focus on what's important in life and get my life under control." Update 12/10/21: No changes at this time.   Discharge Plan or Barriers: CSW will assist pt with development of an appropriate aftercare/discharge plan. Update 12/10/21: Pt is currently reaching out to [facility] for placement. Had an interview last night and should hear back from the house today regarding potential acceptance.     Reason for Continuation of Hospitalization: Depression Medication stabilization Suicidal ideation   Estimated Length of Stay: 1-7 days Update 12/10/21: No changes at this time.  Last 3 [place] Suicide Severity Risk Score: Flowsheet Row Admission (Current) from 12/04/2021 in  [facility] INPATIENT  BEHAVIORAL MEDICINE ED to Hosp-Admission (Discharged) from 11/30/2021 in [facility] ED from 07/23/2021 in [facility] EMERGENCY DEPARTMENT  C-SSRS RISK CATEGORY High Risk High Risk No Risk       Last PHQ 2/9 Scores:    04/15/2021    2:16 AM 02/05/2021    4:55 AM 01/15/2021    8:04 AM  Depression screen PHQ 2/9  Decreased Interest 1 2 1   Down, Depressed, Hopeless 1 2 1   PHQ - 2 Score 2 4 2   Altered sleeping 2 0 0  Tired, decreased energy 2 1 0  Change in appetite 2 0 0  Feeling bad or failure about yourself  2 1 2   Trouble concentrating 2 1 0  Moving slowly or fidgety/restless 2 0   Suicidal thoughts 2 2 2   PHQ-9 Score 16 9 6   Difficult doing work/chores Very difficult Extremely dIfficult     Scribe for Treatment Team: , LCSW 12/10/2021 8:54 AM

## 2021-12-10 NOTE — Progress Notes (Signed)
Recreation Therapy Notes   Date: 12/10/2021  Time: 10:50 am    Location: Courtyard    Behavioral response: N/A   Intervention Topic: Leisure   Discussion/Intervention: Patient refused to attend group.   Clinical Observations/Feedback:  Patient refused to attend group.    Greta Yung LRT/CTRS         Mikai Meints 12/10/2021 1:46 PM        Chemika Nightengale 12/10/2021 1:46 PM

## 2021-12-10 NOTE — Plan of Care (Signed)
Patient pleasant and cooperative on approach. Appropriate with staff & peers. Denies SI,HI and AVH. Patient hopefully waiting to hear from the [facility]. Appetite and energy level good. Attended groups. Support and encouragement given.

## 2021-12-10 NOTE — Progress Notes (Signed)
[facility] MD Progress Note  12/10/2021 2:10 PM Nathan Koch  MRN:  [id number] Subjective: Patient has no new complaint.  He is waiting to hear from an [facility] this evening about possible admission.  Mood is stable.  Denies suicidal thoughts. Principal Problem: Severe recurrent major depression without psychotic features ([name]) Diagnosis: Principal Problem:   Severe recurrent major depression without psychotic features ([name]) Active Problems:   Methamphetamine use disorder, severe ([name])  Total Time spent with patient: 30 minutes  Past Psychiatric History: Past history of substance abuse and recurrent depression and suicidal thinking  Past Medical History:  Past Medical History:  Diagnosis Date   Anxiety    Anxiety disorder    Asthma    Bipolar 1 disorder ([name])    Depression    Methamphetamine abuse ([name])    PTSD (post-traumatic stress disorder)     Past Surgical History:  Procedure Laterality Date   CLOSED REDUCTION MANDIBLE N/A 07/02/2018   Procedure: CLOSED REDUCTION MANDIBULAR;  Surgeon: Vernie Murders, MD;  Location: [facility] ORS;  Service: ENT;  Laterality: N/A;   Family History:  Family History  Problem Relation Age of Onset   Asthma Mother    Cancer Mother        breast cancer   Ulcers Mother    Cancer Father        esophogeal cancer   Hypertension Father    Asthma Brother    Family Psychiatric  History: See previous Social History:  Social History   Substance and Sexual Activity  Alcohol Use Not Currently   Alcohol/week: 13.0 standard drinks of alcohol   Types: 6 Cans of beer, 7 Standard drinks or equivalent per week   Comment: previously heavy drinker 2016. most recent 4 beer/ day drinker and none since 01-11-2020     Social History   Substance and Sexual Activity  Drug Use Yes   Types: Methamphetamines   Comment: states he uses 1 g every other day    Social History   Socioeconomic History   Marital status: Single    Spouse name: Not on file   Number  of children: 1   Years of education: 10   Highest education level: 10th grade  Occupational History   Not on file  Tobacco Use   Smoking status: Every Day    Packs/day: 0.50    Years: 8.00    Total pack years: 4.00    Types: Cigarettes   Smokeless tobacco: Never  Vaping Use   Vaping Use: Every day   Substances: Nicotine, Flavoring  Substance and Sexual Activity   Alcohol use: Not Currently    Alcohol/week: 13.0 standard drinks of alcohol    Types: 6 Cans of beer, 7 Standard drinks or equivalent per week    Comment: previously heavy drinker 2016. most recent 4 beer/ day drinker and none since 01-11-2020   Drug use: Yes    Types: Methamphetamines    Comment: states he uses 1 g every other day   Sexual activity: Not on file  Other Topics Concern   Not on file  Social History Narrative   Not on file   Social Determinants of Health   Financial Resource Strain: Not on file  Food Insecurity: Not on file  Transportation Needs: Not on file  Physical Activity: Not on file  Stress: Not on file  Social Connections: Not on file   Additional Social History:  Sleep: Fair  Appetite:  Fair  Current Medications: Current Facility-Administered Medications  Medication Dose Route Frequency Provider Last Rate Last Admin   acetaminophen (TYLENOL) tablet 650 mg  650 mg Oral Q6H PRN Nitisha Civello T, MD       albuterol (VENTOLIN HFA) 108 (90 Base) MCG/ACT inhaler 2 puff  2 puff Inhalation Q6H PRN Shenika Quint, Jackquline Denmark, MD   2 puff at 12/09/21 0810   alum & mag hydroxide-simeth (MAALOX/MYLANTA) 200-200-20 MG/5ML suspension 30 mL  30 mL Oral Q4H PRN Cashe Gatt, Jackquline Denmark, MD       hydrOXYzine (ATARAX) tablet 50 mg  50 mg Oral TID PRN Arriyanna Mersch, Jackquline Denmark, MD   50 mg at 12/09/21 2107   lamoTRIgine (LAMICTAL) tablet 25 mg  25 mg Oral Daily Jurline Folger T, MD   25 mg at 12/10/21 0813   magnesium hydroxide (MILK OF MAGNESIA) suspension 30 mL  30 mL Oral Daily PRN Aayliah Rotenberry T,  MD   30 mL at 12/07/21 0834   QUEtiapine (SEROQUEL) tablet 100 mg  100 mg Oral QHS Dawna Jakes T, MD   100 mg at 12/09/21 2107   sertraline (ZOLOFT) tablet 50 mg  50 mg Oral Daily Nekeshia Lenhardt, Jackquline Denmark, MD   50 mg at 12/10/21 0981   traZODone (DESYREL) tablet 100 mg  100 mg Oral QHS PRN Laurey Salser, Jackquline Denmark, MD   100 mg at 12/09/21 2107    Lab Results: No results found for this or any previous visit (from the past 48 hour(s)).  Blood Alcohol level:  Lab Results  Component Value Date   ETH <10 11/30/2021   ETH <10 04/14/2021    Metabolic Disorder Labs: Lab Results  Component Value Date   HGBA1C 6.0 (H) 01/17/2021   MPG 126 01/17/2021   MPG 126 10/08/2020   No results found for: "PROLACTIN" Lab Results  Component Value Date   CHOL 94 01/17/2021   TRIG 100 01/17/2021   HDL 28 (L) 01/17/2021   CHOLHDL 3.4 01/17/2021   VLDL 20 01/17/2021   LDLCALC 46 01/17/2021   LDLCALC 49 10/08/2020    Physical Findings: AIMS:  , ,  ,  ,    CIWA:    COWS:     Musculoskeletal: Strength & Muscle Tone: within normal limits Gait & Station: normal Patient leans: N/A  Psychiatric Specialty Exam:  Presentation  General Appearance: Appropriate for Environment  Eye Contact:Good  Speech:Clear and Coherent  Speech Volume:Normal  Handedness:Right   Mood and Affect  Mood:Euthymic  Affect:Congruent   Thought Process  Thought Processes:Coherent  Descriptions of Associations:Intact  Orientation:Full (Time, Place and Person)  Thought Content:Logical  History of Schizophrenia/Schizoaffective disorder:No  Duration of Psychotic Symptoms:N/A  Hallucinations:No data recorded Ideas of Reference:None  Suicidal Thoughts:No data recorded Homicidal Thoughts:No data recorded  Sensorium  Memory:Immediate Good; Recent Good  Judgment:Fair  Insight:Fair   Executive Functions  Concentration:Fair  Attention Span:Good  Recall:Good  Fund of  Knowledge:Fair  Language:Fair   Psychomotor Activity  Psychomotor Activity:No data recorded  Assets  Assets:Desire for Improvement; Resilience; Physical Health   Sleep  Sleep:No data recorded   Physical Exam: Physical Exam Vitals and nursing note reviewed.  Constitutional:      Appearance: Normal appearance.  HENT:     Head: Normocephalic and atraumatic.     Mouth/Throat:     Pharynx: Oropharynx is clear.  Eyes:     Pupils: Pupils are equal, round, and reactive to light.  Cardiovascular:     Rate and  Rhythm: Normal rate and regular rhythm.  Pulmonary:     Effort: Pulmonary effort is normal.     Breath sounds: Normal breath sounds.  Abdominal:     General: Abdomen is flat.     Palpations: Abdomen is soft.  Musculoskeletal:        General: Normal range of motion.  Skin:    General: Skin is warm and dry.  Neurological:     General: No focal deficit present.     Mental Status: He is alert. Mental status is at baseline.  Psychiatric:        Mood and Affect: Mood normal.        Thought Content: Thought content normal.    Review of Systems  Constitutional: Negative.   HENT: Negative.    Eyes: Negative.   Respiratory: Negative.    Cardiovascular: Negative.   Gastrointestinal: Negative.   Musculoskeletal: Negative.   Skin: Negative.   Neurological: Negative.   Psychiatric/Behavioral: Negative.     Blood pressure 111/62, pulse (!) 59, temperature 98.3 F (36.8 C), temperature source Oral, resp. rate 20, height 5\' 9"  (1.753 m), weight 107 kg, SpO2 100 %. Body mass index is 34.85 kg/m.   Treatment Plan Summary: Plan continue current medicine.  Supportive therapy and counseling.  We are waiting also for him to find out if he will be able to go to the [facility] as he needs placement and has no other place to stay.  [place], MD 12/10/2021, 2:10 PM

## 2021-12-11 ENCOUNTER — Other Ambulatory Visit: Payer: Self-pay

## 2021-12-11 MED ORDER — QUETIAPINE FUMARATE 100 MG PO TABS: 100.0000 mg | ORAL_TABLET | Freq: Every day | ORAL | 1 refills | Status: DC

## 2021-12-11 MED ORDER — ALBUTEROL SULFATE HFA 108 (90 BASE) MCG/ACT IN AERS
2.0000 | INHALATION_SPRAY | Freq: Four times a day (QID) | RESPIRATORY_TRACT | 0 refills | Status: DC | PRN
  Filled 2021-12-11: qty 6.7, 25d supply, fill #0
  Filled 2021-12-11: qty 1, fill #0

## 2021-12-11 MED ORDER — HYDROXYZINE HCL 50 MG PO TABS
50.0000 mg | ORAL_TABLET | Freq: Three times a day (TID) | ORAL | 0 refills | Status: DC | PRN
  Filled 2021-12-11: qty 20, 7d supply, fill #0

## 2021-12-11 MED ORDER — LAMOTRIGINE 25 MG PO TABS
25.0000 mg | ORAL_TABLET | Freq: Every day | ORAL | 0 refills | Status: DC
  Filled 2021-12-11: qty 10, 10d supply, fill #0

## 2021-12-11 MED ORDER — TRAZODONE HCL 100 MG PO TABS
100.0000 mg | ORAL_TABLET | Freq: Every evening | ORAL | 0 refills | Status: DC | PRN
  Filled 2021-12-11: qty 10, 10d supply, fill #0

## 2021-12-11 MED ORDER — ALBUTEROL SULFATE HFA 108 (90 BASE) MCG/ACT IN AERS: 2.0000 | INHALATION_SPRAY | Freq: Four times a day (QID) | RESPIRATORY_TRACT | 1 refills | Status: DC | PRN

## 2021-12-11 MED ORDER — TRAZODONE HCL 100 MG PO TABS
100.0000 mg | ORAL_TABLET | Freq: Every evening | ORAL | 1 refills | Status: DC | PRN
Start: 2021-12-11 — End: 2021-12-11

## 2021-12-11 MED ORDER — HYDROXYZINE HCL 50 MG PO TABS: 50.0000 mg | ORAL_TABLET | Freq: Three times a day (TID) | ORAL | 1 refills | Status: DC | PRN

## 2021-12-11 MED ORDER — QUETIAPINE FUMARATE 100 MG PO TABS
100.0000 mg | ORAL_TABLET | Freq: Every day | ORAL | 0 refills | Status: DC
  Filled 2021-12-11 (×2): qty 10, 10d supply, fill #0

## 2021-12-11 MED ORDER — LAMOTRIGINE 25 MG PO TABS: 25.0000 mg | ORAL_TABLET | Freq: Every day | ORAL | 1 refills | Status: DC

## 2021-12-11 NOTE — BHH Suicide Risk Assessment (Signed)
[facility] Discharge Suicide Risk Assessment   Principal Problem: Severe recurrent major depression without psychotic features ([name]) Discharge Diagnoses: Principal Problem:   Severe recurrent major depression without psychotic features ([name]) Active Problems:   Methamphetamine use disorder, severe ([name])   Total Time spent with patient: 30 minutes  Musculoskeletal: Strength & Muscle Tone: within normal limits Gait & Station: normal Patient leans: N/A  Psychiatric Specialty Exam  Presentation  General Appearance: Appropriate for Environment  Eye Contact:Good  Speech:Clear and Coherent  Speech Volume:Normal  Handedness:Right   Mood and Affect  Mood:Euthymic  Duration of Depression Symptoms: Less than two weeks  Affect:Congruent   Thought Process  Thought Processes:Coherent  Descriptions of Associations:Intact  Orientation:Full (Time, Place and Person)  Thought Content:Logical  History of Schizophrenia/Schizoaffective disorder:No  Duration of Psychotic Symptoms:N/A  Hallucinations:No data recorded Ideas of Reference:None  Suicidal Thoughts:No data recorded Homicidal Thoughts:No data recorded  Sensorium  Memory:Immediate Good; Recent Good  Judgment:Fair  Insight:Fair   Executive Functions  Concentration:Fair  Attention Span:Good  Recall:Good  Fund of Knowledge:Fair  Language:Fair   Psychomotor Activity  Psychomotor Activity:No data recorded  Assets  Assets:Desire for Improvement; Resilience; Physical Health   Sleep  Sleep:No data recorded  Physical Exam: Physical Exam Vitals and nursing note reviewed.  Constitutional:      Appearance: Normal appearance.  HENT:     Head: Normocephalic and atraumatic.     Mouth/Throat:     Pharynx: Oropharynx is clear.  Eyes:     Pupils: Pupils are equal, round, and reactive to light.  Cardiovascular:     Rate and Rhythm: Normal rate and regular rhythm.  Pulmonary:     Effort: Pulmonary effort is  normal.     Breath sounds: Normal breath sounds.  Abdominal:     General: Abdomen is flat.     Palpations: Abdomen is soft.  Musculoskeletal:        General: Normal range of motion.  Skin:    General: Skin is warm and dry.  Neurological:     General: No focal deficit present.     Mental Status: He is alert. Mental status is at baseline.  Psychiatric:        Attention and Perception: Attention normal.        Mood and Affect: Mood normal.        Speech: Speech normal.        Behavior: Behavior is cooperative.        Thought Content: Thought content normal.        Cognition and Memory: Cognition normal.        Judgment: Judgment normal.    Review of Systems  Constitutional: Negative.   HENT: Negative.    Eyes: Negative.   Respiratory: Negative.    Cardiovascular: Negative.   Gastrointestinal: Negative.   Musculoskeletal: Negative.   Skin: Negative.   Neurological: Negative.   Psychiatric/Behavioral: Negative.     Blood pressure 97/62, pulse (!) 58, temperature 98.4 F (36.9 C), temperature source Oral, resp. rate 18, height 5\' 9"  (1.753 m), weight 107 kg, SpO2 100 %. Body mass index is 34.85 kg/m.  Mental Status Per Nursing Assessment::   On Admission:  Suicidal ideation indicated by patient, Plan includes specific time, place, or method  Demographic Factors:  Male and Low socioeconomic status  Loss Factors: Financial problems/change in socioeconomic status  Historical Factors: Impulsivity  Risk Reduction Factors:   Positive therapeutic relationship  Continued Clinical Symptoms:  Depression:   Impulsivity Alcohol/Substance Abuse/Dependencies  Cognitive Features  That Contribute To Risk:  None    Suicide Risk:  Minimal: No identifiable suicidal ideation.  Patients presenting with no risk factors but with morbid ruminations; may be classified as minimal risk based on the severity of the depressive symptoms    Plan Of Care/Follow-up recommendations:  Patient  will be following up with local mental health providers and substance abuse treatment.  Denies all suicidal ideation.  Affect is upbeat.  Thoughts appear lucid.  Mordecai Rasmussen, MD 12/11/2021, 10:47 AM

## 2021-12-11 NOTE — BHH Counselor (Signed)
CSW met with pt, per request. He informed CSW that he has been accepted into [facility] in [place], [place]. [place] inquired when pt would like to discharge and he stated that he was leaving that up to the team. CSW asked if pt had issue with leaving today and he denied any issues with this. CSW informed pt that provider would be notified about his acceptance and openness to discharge today or anytime in the near future. Pt agreed. No other concerns expressed. Contact ended without incident.   CSW sent secure message to provider in chart to notify of placement and inquire about discharge.   Chalmers Guest. Guerry Bruin, MSW, [place], [place] 12/11/2021 9:38 AM

## 2021-12-11 NOTE — Progress Notes (Signed)
D- Patient alert and oriented. Patient presented in a pleasant mood on assessment reporting that he slept "fair" last night and had no complaints to voice to this Clinical research associate. Patient endorsed both depression/anxiety, stating that "I want to see my daughter", and that his "anxiety is always gonna be high, no reason". Patient denied SI, HI, AVH, and pain at this time. Patient's goal for today is to "stay clean", in which "going to meetings", will help him reach his goal.  A- Scheduled medications administered to patient, per MD orders. Support and encouragement provided.  Routine safety checks conducted every 15 minutes.  Patient informed to notify staff with problems or concerns.  R- No adverse drug reactions noted. Patient contracts for safety at this time. Patient compliant with medications and treatment plan. Patient receptive, calm, and cooperative. Patient interacts well with others on the unit.  Patient remains safe at this time.

## 2021-12-11 NOTE — Group Note (Signed)
[facility] LCSW Group Therapy Note   Group Date: 12/11/2021 Start Time: 1300 End Time: 1400   Type of Therapy/Topic:  Group Therapy:  Balance in Life  Participation Level:  Active   Description of Group:    This group will address the concept of balance and how it feels and looks when one is unbalanced. Patients will be encouraged to process areas in their lives that are out of balance, and identify reasons for remaining unbalanced. Facilitators will guide patients utilizing problem- solving interventions to address and correct the stressor making their life unbalanced. Understanding and applying boundaries will be explored and addressed for obtaining  and maintaining a balanced life. Patients will be encouraged to explore ways to assertively make their unbalanced needs known to significant others in their lives, using other group members and facilitator for support and feedback.  Therapeutic Goals: Patient will identify two or more emotions or situations they have that consume much of in their lives. Patient will identify signs/triggers that life has become out of balance:  Patient will identify two ways to set boundaries in order to achieve balance in their lives:  Patient will demonstrate ability to communicate their needs through discussion and/or role plays  Summary of Patient Progress: Patient was present for the entirety of the group process. He expressed for him balance is being able to take care of himself but also being able to help others. Pt shared that he is always giving and this can ultimately lead to relapse. He spoke about having five months clean in the past but relapsing because he allowed his ex to re-enter his life. Pt describes their relationship as toxic and that it always leads to relapse. He shared that attending meetings and talking with his sponsor helped him achieve his clean time but that he becomes complacent at times, feeling that he is ok, and begins to let things slip.  Attending AA meetings, going to NA meetings, reconnecting with a new sponsor, not letting the toxicity back into his life, and keeping the focus on him were identified as ways in which he can begin to maintain his balance moving forward. Pt was open and receptive to feedback/comments from peers and facilitator.    Therapeutic Modalities:   Cognitive Behavioral Therapy Solution-Focused Therapy Assertiveness Training   Glenis Smoker, LCSW

## 2021-12-11 NOTE — BHH Counselor (Addendum)
CSW met with pt to discuss discharge briefly. He stated that he plans to discharge today and go to the [facility]. He informed CSW that he needs a ride there. Pt has clothes, is a smoker (who declined cessation assistance) and substance user (who voiced interest in treatment). [facility] and [place] discussed as potential outpatient providers. He agreed with this. CSW stated that these agencies would be contacted for follow up. CSW inquired if there was anything else that pt needed. He asked about hygiene bags. CSW let him know that these come from [facility] but that CSW would let them know. He agreed. No other concerns expressed. Contact ended without incident.   CSW updated [facility] about request for homeless bag. Bag left for pt in nursing station by [facility]. Pt label attached to it.  CSW contacted [place] [phone]) to schedule follow up appointment. CSW was asked to send over assessment to [phone]. CSW agreed. No other concerns expressed. Contact ended without incident.   CSW faxed over requested information to [phone].  Nurses' station received call from [facility] to inform pt that no one would be at the house from 2pm-5pm today and that they would prefer that he came when they were there so that he does not have to wait outside for hours. CSW agreed to pass this information along to the pt. No other concerns expressed. Contact ended without incident.   CSW contacted [place] [phone]) to schedule appointment. Unable to make contact but HIPPA compliant voicemail left with contact information for follow through.   CSW received call from [facility]. She asked to speak to pt. Pt was informed that his case has to be staffed and that she would have to get back in touch with him about the decision. He agreed. No other concerns expressed. Contact ended without incident. CSW asked if pt would like CSW to try to continue to schedule him an appointment through [facility] as well.  He agreed. No other concerns expressed. Contact ended without incident.   CSW will continue to follow and update.   Chalmers Guest. Guerry Bruin, MSW, [place], [place] 12/11/2021 11:25 AM

## 2021-12-11 NOTE — Progress Notes (Signed)
Patient ID: Nathan Koch, male   DOB: 14-Dec-1990, 31 y.o.   MRN: [id number]  Discharge Note:  Patient denies SI/HI/AVH at this time. Discharge instructions, AVS, prescriptions, medication supply, patient care package, and transition record gone over with patient. Patient agrees to comply with medication management, follow-up visit, and outpatient therapy. Patient belongings returned to patient. Patient questions and concerns addressed and answered. Patient ambulatory off unit. Patient discharged to [facility], via [employer] [facility] services.

## 2021-12-11 NOTE — Progress Notes (Signed)
Recreation Therapy Notes   Date: 12/11/2021   Time: 10:45 am     Location: Craft room   Behavioral response: Appropriate  Intervention Topic:  Self-care     Discussion/Intervention:  Group content today was focused on Self-Care. The group defined self-care and some positive ways they care for themselves. Individuals expressed ways and reasons why they neglected any self-care in the past. Patients described ways to improve self-care in the future. The group explained what could happen if they did not do any self-care activities at all. The group participated in the intervention "self-care assessment" where they had a chance to discover some of their weaknesses and strengths in self- care. Patient came up with a self-care plan to improve themselves in the future.  Clinical Observations/Feedback: Patient came to group and identified exercising, doing something productive and taking medication as self-care activity he participates in. Individual was social with peers and staff while participating in the intervention.  Rondi Ivy LRT/CTRS         Jacinto Keil 12/11/2021 1:41 PM

## 2021-12-11 NOTE — Discharge Summary (Signed)
Physician Discharge Summary Note  Patient:  Nathan Koch is an 31 y.o., male MRN:  [id number] DOB:  Aug 01, 1990 Patient phone:  [phone] (home)  Patient address:   [address] [place] [address],  Total Time spent with patient: 30 minutes  Date of Admission:  12/04/2021 Date of Discharge: 12/11/2021  Reason for Admission: Patient was admitted after impulsively swallowing an overdose of pills with suicidal ideation on the medical service.  At the time he was frustrated at having no place to stay.  On the psychiatric unit patient has been calm and appropriate with his behavior and has not displayed any dangerous or aggressive or suicidal behavior.  He has been cooperative with medicine and cooperative with attempts by the treatment team to work on discharge planning.  Patient was cooperative with efforts to find an appropriate place to stay and has been accepted in an [facility].  He completely denies suicidal thoughts and agrees to appropriate outpatient follow-up treatment.  Principal Problem: Severe recurrent major depression without psychotic features [facility]) Discharge Diagnoses: Principal Problem:   Severe recurrent major depression without psychotic features ([name]) Active Problems:   Methamphetamine use disorder, severe ([name])   Past Psychiatric History: History of substance use issues recurrently and depression  Past Medical History:  Past Medical History:  Diagnosis Date   Anxiety    Anxiety disorder    Asthma    Bipolar 1 disorder ([name])    Depression    Methamphetamine abuse ([name])    PTSD (post-traumatic stress disorder)     Past Surgical History:  Procedure Laterality Date   CLOSED REDUCTION MANDIBLE N/A 07/02/2018   Procedure: CLOSED REDUCTION MANDIBULAR;  Surgeon: Vernie Murders, MD;  Location: [facility] ORS;  Service: ENT;  Laterality: N/A;   Family History:  Family History  Problem Relation Age of Onset   Asthma Mother    Cancer Mother        breast  cancer   Ulcers Mother    Cancer Father        esophogeal cancer   Hypertension Father    Asthma Brother    Family Psychiatric  History: See previous Social History:  Social History   Substance and Sexual Activity  Alcohol Use Not Currently   Alcohol/week: 13.0 standard drinks of alcohol   Types: 6 Cans of beer, 7 Standard drinks or equivalent per week   Comment: previously heavy drinker 2016. most recent 4 beer/ day drinker and none since 01-11-2020     Social History   Substance and Sexual Activity  Drug Use Yes   Types: Methamphetamines   Comment: states he uses 1 g every other day    Social History   Socioeconomic History   Marital status: Single    Spouse name: Not on file   Number of children: 1   Years of education: 10   Highest education level: 10th grade  Occupational History   Not on file  Tobacco Use   Smoking status: Every Day    Packs/day: 0.50    Years: 8.00    Total pack years: 4.00    Types: Cigarettes   Smokeless tobacco: Never  Vaping Use   Vaping Use: Every day   Substances: Nicotine, Flavoring  Substance and Sexual Activity   Alcohol use: Not Currently    Alcohol/week: 13.0 standard drinks of alcohol    Types: 6 Cans of beer, 7 Standard drinks or equivalent per week    Comment: previously heavy drinker 2016. most recent  4 beer/ day drinker and none since 01-11-2020   Drug use: Yes    Types: Methamphetamines    Comment: states he uses 1 g every other day   Sexual activity: Not on file  Other Topics Concern   Not on file  Social History Narrative   Not on file   Social Determinants of Health   Financial Resource Strain: Not on file  Food Insecurity: Not on file  Transportation Needs: Not on file  Physical Activity: Not on file  Stress: Not on file  Social Connections: Not on file    Hospital Course: See note above.  Continued on medication including Seroquel at night.  Engaged appropriately in groups and group therapy interactions  and treatment team meeting.  Made efforts to find an appropriate place to stay.  Tolerated some frustration without any behavior problems.  Has been accepted into an [facility] and agrees to outpatient mental health and substance abuse treatment no evidence of acute dangerousness  Physical Findings: AIMS:  , ,  ,  ,    CIWA:    COWS:     Musculoskeletal: Strength & Muscle Tone: within normal limits Gait & Station: normal Patient leans: N/A   Psychiatric Specialty Exam:  Presentation  General Appearance: Appropriate for Environment  Eye Contact:Good  Speech:Clear and Coherent  Speech Volume:Normal  Handedness:Right   Mood and Affect  Mood:Euthymic  Affect:Congruent   Thought Process  Thought Processes:Coherent  Descriptions of Associations:Intact  Orientation:Full (Time, Place and Person)  Thought Content:Logical  History of Schizophrenia/Schizoaffective disorder:No  Duration of Psychotic Symptoms:N/A  Hallucinations:No data recorded Ideas of Reference:None  Suicidal Thoughts:No data recorded Homicidal Thoughts:No data recorded  Sensorium  Memory:Immediate Good; Recent Good  Judgment:Fair  Insight:Fair   Executive Functions  Concentration:Fair  Attention Span:Good  Recall:Good  Fund of Knowledge:Fair  Language:Fair   Psychomotor Activity  Psychomotor Activity:No data recorded  Assets  Assets:Desire for Improvement; Resilience; Physical Health   Sleep  Sleep:No data recorded   Physical Exam: Physical Exam Vitals and nursing note reviewed.  Constitutional:      Appearance: Normal appearance.  HENT:     Head: Normocephalic and atraumatic.     Mouth/Throat:     Pharynx: Oropharynx is clear.  Eyes:     Pupils: Pupils are equal, round, and reactive to light.  Cardiovascular:     Rate and Rhythm: Normal rate and regular rhythm.  Pulmonary:     Effort: Pulmonary effort is normal.     Breath sounds: Normal breath sounds.   Abdominal:     General: Abdomen is flat.     Palpations: Abdomen is soft.  Musculoskeletal:        General: Normal range of motion.  Skin:    General: Skin is warm and dry.  Neurological:     General: No focal deficit present.     Mental Status: He is alert. Mental status is at baseline.  Psychiatric:        Mood and Affect: Mood normal.        Thought Content: Thought content normal.    Review of Systems  Constitutional: Negative.   HENT: Negative.    Eyes: Negative.   Respiratory: Negative.    Cardiovascular: Negative.   Gastrointestinal: Negative.   Musculoskeletal: Negative.   Skin: Negative.   Neurological: Negative.   Psychiatric/Behavioral: Negative.     Blood pressure 97/62, pulse (!) 58, temperature 98.4 F (36.9 C), temperature source Oral, resp. rate 18, height 5\' 9"  (1.753  m), weight 107 kg, SpO2 100 %. Body mass index is 34.85 kg/m.   Social History   Tobacco Use  Smoking Status Every Day   Packs/day: 0.50   Years: 8.00   Total pack years: 4.00   Types: Cigarettes  Smokeless Tobacco Never   Tobacco Cessation:  A prescription for an FDA-approved tobacco cessation medication was offered at discharge and the patient refused   Blood Alcohol level:  Lab Results  Component Value Date   [facility] <10 11/30/2021   ETH <10 04/14/2021    Metabolic Disorder Labs:  Lab Results  Component Value Date   HGBA1C 6.0 (H) 01/17/2021   MPG 126 01/17/2021   MPG 126 10/08/2020   No results found for: "PROLACTIN" Lab Results  Component Value Date   CHOL 94 01/17/2021   TRIG 100 01/17/2021   HDL 28 (L) 01/17/2021   CHOLHDL 3.4 01/17/2021   VLDL 20 01/17/2021   LDLCALC 46 01/17/2021   LDLCALC 49 10/08/2020    See Psychiatric Specialty Exam and Suicide Risk Assessment completed by Attending Physician prior to discharge.  Discharge destination:  Home  Is patient on multiple antipsychotic therapies at discharge:  No   Has Patient had three or more failed  trials of antipsychotic monotherapy by history:  No  Recommended Plan for Multiple Antipsychotic Therapies: NA  Discharge Instructions     Diet - low sodium heart healthy   Complete by: As directed    Increase activity slowly   Complete by: As directed       Allergies as of 12/11/2021       Reactions   Latex Hives        Medication List     STOP taking these medications    nicotine 14 mg/24hr patch Commonly known as: NICODERM CQ - dosed in mg/24 hours   sertraline 50 MG tablet Commonly known as: ZOLOFT       TAKE these medications      Indication  albuterol 108 (90 Base) MCG/ACT inhaler Commonly known as: VENTOLIN HFA Inhale 2 puffs into the lungs every 6 (six) hours as needed for shortness of breath. What changed:  how much to take when to take this reasons to take this  Indication: Asthma   hydrOXYzine 50 MG tablet Commonly known as: ATARAX Take 1 tablet (50 mg total) by mouth 3 (three) times daily as needed for anxiety. What changed:  medication strength how much to take  Indication: Feeling Anxious   lamoTRIgine 25 MG tablet Commonly known as: LAMICTAL Take 1 tablet (25 mg total) by mouth daily. Start taking on: December 12, 2021  Indication: Depressive Phase of Manic-Depression   QUEtiapine 100 MG tablet Commonly known as: SEROQUEL Take 1 tablet (100 mg total) by mouth at bedtime.  Indication: Major Depressive Disorder   traZODone 100 MG tablet Commonly known as: DESYREL Take 1 tablet (100 mg total) by mouth at bedtime as needed for sleep.  Indication: Trouble Sleeping         Follow-up recommendations: Follow-up with local mental health and substance abuse treatment  Comments: See previous  Signed: Mordecai Rasmussen, MD 12/11/2021, 10:50 AM

## 2021-12-11 NOTE — BHH Group Notes (Signed)
BHH Group Notes:  (Nursing/MHT/Case Management/Adjunct)  Date:  12/11/2021  Time:  10:13 AM  Type of Therapy:   community meeting  Participation Level:  Did Not Attend   Summary of Progress/Problems:  Rodena Goldmann 12/11/2021, 10:13 AM

## 2021-12-11 NOTE — Progress Notes (Signed)
Patient came up to nurses station reporting "not feeling good, my stomach hurts, my head hurts, I think I'm dehydrated". This Clinical research associate obtained a set of vitals and they were WNL. Patient thanked this Clinical research associate and walked off towards the dayroom.

## 2021-12-11 NOTE — Progress Notes (Signed)
  [facility] Adult Case Management Discharge Plan :  Will you be returning to the same living situation after discharge:  No. Pt to discharge to [facility]. At discharge, do you have transportation home?: Yes,  CSW to arrange transportation.  Do you have the ability to pay for your medications: No.  Release of information consent forms completed and in the chart;  Patient's signature needed at discharge.  Patient to Follow up at:  Follow-up Information     [facility] Follow up.   Specialty: Behavioral Health Why: You have an appointment scheduled for Wednesday, 12/17/21 at 1pm. Thanks! Contact information: [address] [place] [address] [phone]        [facility] Call.   Why: Follow up regarding their decision about IOP services. Thanks! Contact information: [address]  [place], [place] [address] Phone: [phone] Fax: [phone]                Next level of care provider has access to [facility] Link:no  Safety Planning and Suicide Prevention discussed: Yes,  SPE completed with pt.     Has patient been referred to the Quitline?: Patient refused referral  Patient has been referred for addiction treatment: Yes  Glenis Smoker, LCSW 12/11/2021, 2:28 PM

## 2021-12-17 ENCOUNTER — Ambulatory Visit (HOSPITAL_COMMUNITY): Payer: 59 | Admitting: Student

## 2021-12-24 ENCOUNTER — Ambulatory Visit (HOSPITAL_COMMUNITY): Payer: 59 | Admitting: Student

## 2021-12-29 ENCOUNTER — Observation Stay (HOSPITAL_COMMUNITY): Payer: Self-pay

## 2021-12-29 ENCOUNTER — Other Ambulatory Visit: Payer: Self-pay

## 2021-12-29 ENCOUNTER — Encounter (HOSPITAL_COMMUNITY): Payer: Self-pay

## 2021-12-29 ENCOUNTER — Observation Stay (HOSPITAL_COMMUNITY)
Admission: EM | Admit: 2021-12-29 | Discharge: 2021-12-30 | Disposition: A | Discharge status: Other Acute Inpatient Hospital | Payer: Self-pay | Attending: Internal Medicine | Admitting: Internal Medicine

## 2021-12-29 DIAGNOSIS — N179 Acute kidney failure, unspecified: Principal | ICD-10-CM | POA: Diagnosis present

## 2021-12-29 DIAGNOSIS — F17200 Nicotine dependence, unspecified, uncomplicated: Secondary | ICD-10-CM | POA: Diagnosis present

## 2021-12-29 DIAGNOSIS — Z20822 Contact with and (suspected) exposure to covid-19: Secondary | ICD-10-CM | POA: Insufficient documentation

## 2021-12-29 DIAGNOSIS — J45909 Unspecified asthma, uncomplicated: Secondary | ICD-10-CM | POA: Insufficient documentation

## 2021-12-29 DIAGNOSIS — F319 Bipolar disorder, unspecified: Secondary | ICD-10-CM | POA: Diagnosis present

## 2021-12-29 DIAGNOSIS — R45851 Suicidal ideations: Secondary | ICD-10-CM

## 2021-12-29 DIAGNOSIS — M6282 Rhabdomyolysis: Secondary | ICD-10-CM | POA: Diagnosis present

## 2021-12-29 DIAGNOSIS — Z79899 Other long term (current) drug therapy: Secondary | ICD-10-CM | POA: Insufficient documentation

## 2021-12-29 DIAGNOSIS — F17210 Nicotine dependence, cigarettes, uncomplicated: Secondary | ICD-10-CM | POA: Insufficient documentation

## 2021-12-29 DIAGNOSIS — Z91040 Latex allergy status: Secondary | ICD-10-CM | POA: Insufficient documentation

## 2021-12-29 DIAGNOSIS — D72829 Elevated white blood cell count, unspecified: Secondary | ICD-10-CM | POA: Insufficient documentation

## 2021-12-29 DIAGNOSIS — F1510 Other stimulant abuse, uncomplicated: Secondary | ICD-10-CM | POA: Insufficient documentation

## 2021-12-29 DIAGNOSIS — F1520 Other stimulant dependence, uncomplicated: Secondary | ICD-10-CM | POA: Diagnosis present

## 2021-12-29 LAB — CBC
HCT: 49.2 % (ref 39.0–52.0)
Hemoglobin: 16.6 g/dL (ref 13.0–17.0)
MCH: 28.9 pg (ref 26.0–34.0)
MCHC: 33.7 g/dL (ref 30.0–36.0)
MCV: 85.7 fL (ref 80.0–100.0)
Platelets: 400 10*3/uL (ref 150–400)
RBC: 5.74 MIL/uL (ref 4.22–5.81)
RDW: 14.1 % (ref 11.5–15.5)
WBC: 19.9 10*3/uL — ABNORMAL HIGH (ref 4.0–10.5)
nRBC: 0 % (ref 0.0–0.2)

## 2021-12-29 LAB — RAPID URINE DRUG SCREEN, HOSP PERFORMED
Amphetamines: POSITIVE — AB
Barbiturates: NOT DETECTED
Benzodiazepines: NOT DETECTED
Cocaine: NOT DETECTED
Opiates: NOT DETECTED
Tetrahydrocannabinol: NOT DETECTED

## 2021-12-29 LAB — COMPREHENSIVE METABOLIC PANEL
ALT: 33 U/L (ref 0–44)
AST: 98 U/L — ABNORMAL HIGH (ref 15–41)
Albumin: 5.3 g/dL — ABNORMAL HIGH (ref 3.5–5.0)
Alkaline Phosphatase: 78 U/L (ref 38–126)
Anion gap: 15 (ref 5–15)
BUN: 40 mg/dL — ABNORMAL HIGH (ref 6–20)
CO2: 17 mmol/L — ABNORMAL LOW (ref 22–32)
Calcium: 10.3 mg/dL (ref 8.9–10.3)
Chloride: 110 mmol/L (ref 98–111)
Creatinine, Ser: 1.61 mg/dL — ABNORMAL HIGH (ref 0.61–1.24)
GFR, Estimated: 59 mL/min — ABNORMAL LOW (ref >60)
Glucose, Bld: 131 mg/dL — ABNORMAL HIGH (ref 70–99)
Potassium: 4.3 mmol/L (ref 3.5–5.1)
Sodium: 142 mmol/L (ref 135–145)
Total Bilirubin: 2.8 mg/dL — ABNORMAL HIGH (ref 0.3–1.2)
Total Protein: 9.6 g/dL — ABNORMAL HIGH (ref 6.5–8.1)

## 2021-12-29 LAB — URINALYSIS, ROUTINE W REFLEX MICROSCOPIC
Bilirubin Urine: NEGATIVE
Glucose, UA: NEGATIVE mg/dL
Ketones, ur: 5 mg/dL — AB
Leukocytes,Ua: NEGATIVE
Nitrite: NEGATIVE
Protein, ur: 100 mg/dL — AB
Specific Gravity, Urine: 1.016 (ref 1.005–1.030)
pH: 5 (ref 5.0–8.0)

## 2021-12-29 LAB — CK: Total CK: 7532 U/L — ABNORMAL HIGH (ref 49–397)

## 2021-12-29 LAB — ETHANOL: Alcohol, Ethyl (B): <10 mg/dL (ref <10)

## 2021-12-29 LAB — SALICYLATE LEVEL: Salicylate Lvl: <7 mg/dL — ABNORMAL LOW (ref 7.0–30.0)

## 2021-12-29 LAB — ACETAMINOPHEN LEVEL: Acetaminophen (Tylenol), Serum: <10 ug/mL — ABNORMAL LOW (ref 10–30)

## 2021-12-29 MED ORDER — ALBUTEROL SULFATE HFA 108 (90 BASE) MCG/ACT IN AERS: 2.0000 | INHALATION_SPRAY | Freq: Four times a day (QID) | RESPIRATORY_TRACT | Status: DC | PRN

## 2021-12-29 MED ORDER — SODIUM CHLORIDE 0.9 % IV BOLUS (SEPSIS)
1000.0000 mL | Freq: Once | INTRAVENOUS | Status: AC
  Administered 2021-12-29: 1000 mL via INTRAVENOUS

## 2021-12-29 MED ORDER — SODIUM CHLORIDE 0.9 % IV SOLN
1000.0000 mL | INTRAVENOUS | Status: DC
  Administered 2021-12-29 (×3): 1000 mL via INTRAVENOUS

## 2021-12-29 MED ORDER — ONDANSETRON 4 MG PO TBDP
4.0000 mg | ORAL_TABLET | Freq: Four times a day (QID) | ORAL | Status: DC | PRN
  Administered 2021-12-29: 4 mg via ORAL
  Filled 2021-12-29: qty 1

## 2021-12-29 MED ORDER — HYDROCORTISONE 0.5 % EX CREA
TOPICAL_CREAM | Freq: Two times a day (BID) | CUTANEOUS | Status: DC
  Filled 2021-12-29: qty 28.35

## 2021-12-29 MED ORDER — ONDANSETRON HCL 4 MG PO TABS: 4.0000 mg | ORAL_TABLET | Freq: Four times a day (QID) | ORAL | Status: DC | PRN

## 2021-12-29 MED ORDER — QUETIAPINE FUMARATE 100 MG PO TABS
100.0000 mg | ORAL_TABLET | Freq: Every day | ORAL | Status: DC
  Administered 2021-12-29: 100 mg via ORAL
  Filled 2021-12-29: qty 1

## 2021-12-29 MED ORDER — ACETAMINOPHEN 325 MG PO TABS
650.0000 mg | ORAL_TABLET | Freq: Four times a day (QID) | ORAL | Status: DC | PRN
  Administered 2021-12-29: 650 mg via ORAL
  Filled 2021-12-29: qty 2

## 2021-12-29 MED ORDER — ONDANSETRON HCL 4 MG/2ML IJ SOLN: 4.0000 mg | Freq: Four times a day (QID) | INTRAMUSCULAR | Status: DC | PRN

## 2021-12-29 MED ORDER — ACETAMINOPHEN 650 MG RE SUPP: 650.0000 mg | Freq: Four times a day (QID) | RECTAL | Status: DC | PRN

## 2021-12-29 NOTE — ED Notes (Signed)
Belongings collected and placed in cabinet above nurses' station. Pt changed over to purple scrubs and wanded by security.

## 2021-12-29 NOTE — Consult Note (Signed)
[place] [facility] Consult   Reason for Consult:  Suicidal ideation Referring Physician:  Janee Morn Patient Identification: Nathan Koch MRN:  [id number] Principal Diagnosis: AKI (acute kidney injury) ([facility]) Diagnosis:  Principal Problem:   AKI (acute kidney injury) ([place]) Active Problems:   Methamphetamine use disorder, severe ([place])   Suicidal ideation   Bipolar 1 disorder, depressed ([place])   Tobacco use disorder   Rhabdomyolysis   Total Time spent with patient: 45 minutes  Subjective:   Nathan Koch is a 31 y.o. male patient admitted with . "My mental status is deteriorating. I had the money to buy food and drinks and I didn't. I actually tried to kill myself this time. Its catching up with you, my kidneys are failing." Endorses ongoing suicidal thoughts with a plan to cut with box cutter. He reports the cops have interrupted by police. "  I liked that box cutter too.  I wonder if they will give him a box cutter back."   Nathan Koch is a 31 y.o. male patient seen by psychiatry for suicidal ideations, recommended for overnight observations and re-evaluation today.  Patient states, "my drug addiction and homelessness makes it difficult for me.  When I am in recovery I have a recovery side and a fuck it side.  I will be doing very good taking the medication, and then my fuck it Side kicks in and I try and kill myself."  Patient seen face-to-face; chart reviewed and consulted with Dr. Caswell Corwin on 12/29/21.  Endorses suicidal ideations with a plan to cut himself although his plan was interrupted by police.  Patient has been seen by this facility 3x within the past 1 months with similar presentation.  He was also admitted to [facility] for 1 week (8/10-8/17) and admitted to the medical floor for acute kidney injury on (08/06-8/10). Has a history for methamphetamine use, and MDD but communicated a plan to EDP and hospitalist to OD on heroin or cut his wrist at the time of admission.   States last met-amphetamine use was approximately 4 hours ago; current UDS is positive for methamphetamines. He is connected with [facility] for outpatient treatment but has not been seen there for several weeks, however reports compliance with medication.  He also has been a no show at [facility] for outpatient psychiatric visits. This is incongruent with information he previously shared during admission assessment.  Per records patient previously stated he wanted enrollment with inpatient psychiatric hospital.  When asking what would be done differently if admitted to inpatient psych he reports " I will need to focus on myself more.  Last time I did not do it for me.  I need to pay more attention, so I can be successful. "  Patient also has been displaced from his previously arranged housing at [facility], due to substance use and is currently homeless.   He is prescribed Lamictal 58m po daily and quetiapine 1054mpo qhs for depression.  Reports he has been taking his medication, although he has not taken it for 3 days since binging on methamphetamines.  Historically, has a history methamphetamine use last used today. He has a history for misdemeanor charges but currently denies pending court dates.  Patient is alert and oriented, calm and cooperative, very pleasant.  Patient continues to endorse ongoing suicidal ideations with a plan to cut himself, although of low risk at this time as police have disposed of sharp objects.  He has  multiple risk factors for suicide attempts, and currently meets criteria for inpatient hospitalization.  Patient agrees he would benefit from long-term inpatient rehabilitation, and would consider this a viable option, once discharged from inpatient psych hospitalization.  HPI:  Nathan Koch is a 31 y.o. male with medical history significant of bipolar d/o, polysubstance abuse. Presenting w/ suicidal ideation. He reports that he relapsed  on meth use. He's disappointed in himself and feels that he doesn't deserve to live. He lost his home and has been living outside for several days. He says this is also contributing to his depression. He has cut himself with box cutters. He is worried he will hurt himself even worse, so he came to the ED for help. He denies any other aggravating or alleviating factors.   Past Psychiatric History: Depression, PTSD, anxiety, substance abuse. Suicide attempts "at least 3 bad ones" a total of about 5.  (1) 2022 cut self left forearm (2) jumped in front of a car (3) cutting to left arm. Last inpatient [facility] 08/10, 1 week LOS. History of [facility], [place], [facility], [facility], [facility] (detox and rehab).  -Current outpatient [facility] [place], medication management.   Risk to Self:  Yes Risk to Others:   Denies   Past Medical History:  Past Medical History:  Diagnosis Date   Anxiety    Anxiety disorder    Asthma    Bipolar 1 disorder ([place])    Depression    Methamphetamine abuse ([place])    PTSD (post-traumatic stress disorder)     Past Surgical History:  Procedure Laterality Date   CLOSED REDUCTION MANDIBLE N/A 07/02/2018   Procedure: CLOSED REDUCTION MANDIBULAR;  Surgeon: Margaretha Sheffield, MD;  Location: [facility] ORS;  Service: ENT;  Laterality: N/A;   Family History:  Family History  Problem Relation Age of Onset   Asthma Mother    Cancer Mother        breast cancer   Ulcers Mother    Cancer Father        esophogeal cancer   Hypertension Father    Asthma Brother    Family Psychiatric  History:  Social History:  Social History   Substance and Sexual Activity  Alcohol Use Not Currently   Alcohol/week: 13.0 standard drinks of alcohol   Types: 6 Cans of beer, 7 Standard drinks or equivalent per week   Comment: previously heavy drinker 2016. most recent 4 beer/ day drinker and none since 01-11-2020     Social History   Substance and Sexual Activity  Drug Use  Yes   Types: Methamphetamines   Comment: states he uses 1 g every other day    Social History   Socioeconomic History   Marital status: Single    Spouse name: Not on file   Number of children: 1   Years of education: 10   Highest education level: 10th grade  Occupational History   Not on file  Tobacco Use   Smoking status: Every Day    Packs/day: 0.50    Years: 8.00    Total pack years: 4.00    Types: Cigarettes   Smokeless tobacco: Never  Vaping Use   Vaping Use: Every day   Substances: Nicotine, Flavoring  Substance and Sexual Activity   Alcohol use: Not Currently    Alcohol/week: 13.0 standard drinks of alcohol    Types: 6 Cans of beer, 7 Standard drinks or equivalent per week    Comment: previously heavy drinker  2016. most recent 4 beer/ day drinker and none since 01-11-2020   Drug use: Yes    Types: Methamphetamines    Comment: states he uses 1 g every other day   Sexual activity: Not on file  Other Topics Concern   Not on file  Social History Narrative   Not on file   Social Determinants of Health   Financial Resource Strain: Not on file  Food Insecurity: Not on file  Transportation Needs: Not on file  Physical Activity: Not on file  Stress: Not on file  Social Connections: Not on file   Additional Social History:    Allergies:   Allergies  Allergen Reactions   Latex Hives    Labs:  Results for orders placed or performed during the hospital encounter of 12/29/21 (from the past 48 hour(s))  Comprehensive metabolic panel     Status: Abnormal   Collection Time: 12/29/21  4:38 AM  Result Value Ref Range   Sodium 142 135 - 145 mmol/L   Potassium 4.3 3.5 - 5.1 mmol/L   Chloride 110 98 - 111 mmol/L   CO2 17 (L) 22 - 32 mmol/L   Glucose, Bld 131 (H) 70 - 99 mg/dL    Comment: Glucose reference range applies only to samples taken after fasting for at least 8 hours.   BUN 40 (H) 6 - 20 mg/dL   Creatinine, Ser 1.61 (H) 0.61 - 1.24 mg/dL   Calcium 10.3  8.9 - 10.3 mg/dL   Total Protein 9.6 (H) 6.5 - 8.1 g/dL   Albumin 5.3 (H) 3.5 - 5.0 g/dL   AST 98 (H) 15 - 41 U/L   ALT 33 0 - 44 U/L   Alkaline Phosphatase 78 38 - 126 U/L   Total Bilirubin 2.8 (H) 0.3 - 1.2 mg/dL   GFR, Estimated 59 (L) >60 mL/min    Comment: (NOTE) Calculated using the CKD-EPI Creatinine Equation (2021)    Anion gap 15 5 - 15    Comment: Performed at [facility], [place] [address] [place], [place] [address]  Ethanol     Status: None   Collection Time: 12/29/21  4:38 AM  Result Value Ref Range   Alcohol, Ethyl (B) <10 <10 mg/dL    Comment: (NOTE) Lowest detectable limit for serum alcohol is 10 mg/dL.  For medical purposes only. Performed at [facility], [place] [address], [place], [place] [address]   Salicylate level     Status: Abnormal   Collection Time: 12/29/21  4:38 AM  Result Value Ref Range   Salicylate Lvl <7.4 (L) 7.0 - 30.0 mg/dL    Comment: Performed at [facility], [place] [address], [place], [place] [address]  Acetaminophen level     Status: Abnormal   Collection Time: 12/29/21  4:38 AM  Result Value Ref Range   Acetaminophen (Tylenol), Serum <10 (L) 10 - 30 ug/mL    Comment: (NOTE) Therapeutic concentrations vary significantly. A range of 10-30 ug/mL  may be an effective concentration for many patients. However, some  are best treated at concentrations outside of this range. Acetaminophen concentrations >150 ug/mL at 4 hours after ingestion  and >50 ug/mL at 12 hours after ingestion are often associated with  toxic reactions.  Performed at [facility], [place] [address] [place], [place] [address]   cbc     Status: Abnormal   Collection Time: 12/29/21  4:38 AM  Result Value Ref Range   WBC 19.9 (  H) 4.0 - 10.5 K/uL   RBC 5.74 4.22 - 5.81 MIL/uL   Hemoglobin 16.6 13.0 - 17.0 g/dL   HCT 49.2 39.0 - 52.0 %   MCV 85.7 80.0 - 100.0 fL   MCH 28.9 26.0 - 34.0 pg   MCHC  33.7 30.0 - 36.0 g/dL   RDW 14.1 11.5 - 15.5 %   Platelets 400 150 - 400 K/uL   nRBC 0.0 0.0 - 0.2 %    Comment: Performed at [facility], [place] [address], [place], [place] [address]  CK     Status: Abnormal   Collection Time: 12/29/21  4:38 AM  Result Value Ref Range   Total CK 7,532 (H) 49 - 397 U/L    Comment: RESULTS CONFIRMED BY MANUAL DILUTION Performed at [facility], [place] [address] [place], [place] [address]   Rapid urine drug screen (hospital performed)     Status: Abnormal   Collection Time: 12/29/21  4:49 AM  Result Value Ref Range   Opiates NONE DETECTED NONE DETECTED   Cocaine NONE DETECTED NONE DETECTED   Benzodiazepines NONE DETECTED NONE DETECTED   Amphetamines POSITIVE (A) NONE DETECTED   Tetrahydrocannabinol NONE DETECTED NONE DETECTED   Barbiturates NONE DETECTED NONE DETECTED    Comment: (NOTE) DRUG SCREEN FOR MEDICAL PURPOSES ONLY.  IF CONFIRMATION IS NEEDED FOR ANY PURPOSE, NOTIFY LAB WITHIN 5 DAYS.  LOWEST DETECTABLE LIMITS FOR URINE DRUG SCREEN Drug Class                     Cutoff (ng/mL) Amphetamine and metabolites    1000 Barbiturate and metabolites    200 Benzodiazepine                 169 Tricyclics and metabolites     300 Opiates and metabolites        300 Cocaine and metabolites        300 THC                            50 Performed at [facility], [place] [address] [place], [place] [address]   Urinalysis, Routine w reflex microscopic     Status: Abnormal   Collection Time: 12/29/21  4:49 AM  Result Value Ref Range   Color, Urine AMBER (A) YELLOW    Comment: BIOCHEMICALS MAY BE AFFECTED BY COLOR   APPearance HAZY (A) CLEAR   Specific Gravity, Urine 1.016 1.005 - 1.030   pH 5.0 5.0 - 8.0   Glucose, UA NEGATIVE NEGATIVE mg/dL   Hgb urine dipstick MODERATE (A) NEGATIVE   Bilirubin Urine NEGATIVE NEGATIVE   Ketones, ur 5 (A) NEGATIVE mg/dL   Protein, ur 100 (A) NEGATIVE mg/dL    Nitrite NEGATIVE NEGATIVE   Leukocytes,Ua NEGATIVE NEGATIVE   RBC / HPF 0-5 0 - 5 RBC/hpf   WBC, UA 11-20 0 - 5 WBC/hpf   Bacteria, UA RARE (A) NONE SEEN   Squamous Epithelial / LPF 0-5 0 - 5   Mucus PRESENT    Hyaline Casts, UA PRESENT    Sperm, UA PRESENT     Comment: Performed at [facility], [place] [address] [place],  [address]    Current Facility-Administered Medications  Medication Dose Route Frequency Provider Last Rate Last Admin   0.9 %  sodium chloride infusion  1,000 mL Intravenous Continuous Quintella Reichert, MD 125 mL/hr at 12/29/21 0549 1,000  mL at 12/29/21 0549   ondansetron (ZOFRAN-ODT) disintegrating tablet 4 mg  4 mg Oral Q6H PRN Quintella Reichert, MD   4 mg at 12/29/21 9476   Current Outpatient Medications  Medication Sig Dispense Refill   albuterol (VENTOLIN HFA) 108 (90 Base) MCG/ACT inhaler Inhale 2 puffs into the lungs every 6 (six) hours as needed for shortness of breath. 6.7 g 0   hydrOXYzine (ATARAX) 50 MG tablet Take 1 tablet (50 mg total) by mouth 3 (three) times daily as needed for anxiety. 20 tablet 0   lamoTRIgine (LAMICTAL) 25 MG tablet Take 1 tablet (25 mg total) by mouth daily. 10 tablet 0   QUEtiapine (SEROQUEL) 100 MG tablet Take 1 tablet (100 mg total) by mouth at bedtime. (Patient not taking: Reported on 12/29/2021) 10 tablet 0   traZODone (DESYREL) 100 MG tablet Take 1 tablet (100 mg total) by mouth at bedtime as needed for sleep. (Patient not taking: Reported on 12/29/2021) 10 tablet 0    Musculoskeletal: Strength & Muscle Tone: within normal limits Gait & Station: normal Patient leans: N/A   Psychiatric Specialty Exam:  Presentation  General Appearance: Appropriate for Environment  Eye Contact:Good  Speech:Clear and Coherent  Speech Volume:Normal  Handedness:Right   Mood and Affect  Mood:Euthymic  Affect:Congruent   Thought Process  Thought Processes:Coherent  Descriptions of  Associations:Intact  Orientation:Full (Time, Place and Person)  Thought Content:Logical  History of Schizophrenia/Schizoaffective disorder:No  Duration of Psychotic Symptoms:N/A  Hallucinations:No data recorded Ideas of Reference:None  Suicidal Thoughts:yes with intent with a plan, with no means Homicidal Thoughts:No data recorded  Sensorium  Memory:Immediate Good; Recent Good  Judgment:Fair  Insight:Fair   Executive Functions  Concentration:Fair  Attention Span:Good  [place] for Improvement; Resilience; Physical Health   Sleep  Sleep:No data recorded  Physical Exam: Physical Exam Vitals and nursing note reviewed.    ROS Blood pressure 114/81, pulse 98, temperature 98.1 F (36.7 C), temperature source Oral, resp. rate (!) 22, height 5' 8"  (1.727 m), weight 104.3 kg, SpO2 100 %. Body mass index is 34.97 kg/m.  Treatment Plan Summary: Daily contact with patient to assess and evaluate symptoms and progress in treatment, Medication management, and Plan    -Resume home medications. -Will benefit from [facility] consult for housing and inpatient rehabilitation resources   Disposition: Recommend psychiatric Inpatient admission when medically cleared.  Suella Broad, FNP 12/29/2021 10:00 AM

## 2021-12-29 NOTE — H&P (Signed)
History and Physical    Patient: Nathan Koch:[id number] DOB: 01/30/1991 DOA: 12/29/2021 DOS: the patient was seen and examined on 12/29/2021 PCP: Pcp, No  Patient coming from: Home  Chief Complaint:  Chief Complaint  Patient presents with   Suicidal   HPI: Nathan Koch is a 31 y.o. male with medical history significant of bipolar d/o, polysubstance abuse. Presenting w/ suicidal ideation. He reports that he relapsed on meth use. He's disappointed in himself and feels that he doesn't deserve to live. He lost his home and has been living outside for several days. He says this is also contributing to his depression. He has cut himself with box cutters. He is worried he will hurt himself even worse, so he came to the ED for help. He denies any other aggravating or alleviating factors.   Review of Systems: As mentioned in the history of present illness. All other systems reviewed and are negative. Past Medical History:  Diagnosis Date   Anxiety    Anxiety disorder    Asthma    Bipolar 1 disorder ([name])    Depression    Methamphetamine abuse ([name])    PTSD (post-traumatic stress disorder)    Past Surgical History:  Procedure Laterality Date   CLOSED REDUCTION MANDIBLE N/A 07/02/2018   Procedure: CLOSED REDUCTION MANDIBULAR;  Surgeon: Vernie Murders, MD;  Location: [facility] ORS;  Service: ENT;  Laterality: N/A;   Social History:  reports that he has been smoking cigarettes. He has a 4.00 pack-year smoking history. He has never used smokeless tobacco. He reports that he does not currently use alcohol after a past usage of about 13.0 standard drinks of alcohol per week. He reports current drug use. Drug: Methamphetamines.  Allergies  Allergen Reactions   Latex Hives    Family History  Problem Relation Age of Onset   Asthma Mother    Cancer Mother        breast cancer   Ulcers Mother    Cancer Father        esophogeal cancer   Hypertension Father    Asthma Brother     Prior to  Admission medications   Medication Sig Start Date End Date Taking? Authorizing Provider  albuterol (VENTOLIN HFA) 108 (90 Base) MCG/ACT inhaler Inhale 2 puffs into the lungs every 6 (six) hours as needed for shortness of breath. 12/11/21  Yes Clapacs, Jackquline Denmark, MD  hydrOXYzine (ATARAX) 50 MG tablet Take 1 tablet (50 mg total) by mouth 3 (three) times daily as needed for anxiety. 12/11/21  Yes Clapacs, Jackquline Denmark, MD  lamoTRIgine (LAMICTAL) 25 MG tablet Take 1 tablet (25 mg total) by mouth daily. 12/12/21  Yes Clapacs, Jackquline Denmark, MD  QUEtiapine (SEROQUEL) 100 MG tablet Take 1 tablet (100 mg total) by mouth at bedtime. Patient not taking: Reported on 12/29/2021 12/11/21   Clapacs, Jackquline Denmark, MD  traZODone (DESYREL) 100 MG tablet Take 1 tablet (100 mg total) by mouth at bedtime as needed for sleep. Patient not taking: Reported on 12/29/2021 12/11/21   Audery Amel, MD    Physical Exam: Vitals:   12/29/21 0515 12/29/21 0530 12/29/21 0716 12/29/21 0745  BP: 126/85 (!) 130/90 (!) 151/87 132/76  Pulse: (!) 116 (!) 108 98 89  Resp: 18 19 16 15   Temp:   98.1 F (36.7 C)   TempSrc:   Oral   SpO2: 100% 100% 100% 100%  Weight:      Height:       General:  31 y.o. male resting in bed in NAD Eyes: PERRL, normal sclera ENMT: Nares patent w/o discharge, orophaynx clear, dentition normal, ears w/o discharge/lesions/ulcers Neck: Supple, trachea midline Cardiovascular: RRR, +S1, S2, no m/g/r, equal pulses throughout Respiratory: CTABL, no w/r/r, normal WOB GI: BS+, NDNT, no masses noted, no organomegaly noted MSK: No e/c/c; scarring on left arm and right leg Neuro: A&O x 3, no focal deficits Psyc: Appropriate interaction, remorseful, cooperative  Data Reviewed:  Lab Results  Component Value Date   NA 142 12/29/2021   K 4.3 12/29/2021   CO2 17 (L) 12/29/2021   GLUCOSE 131 (H) 12/29/2021   BUN 40 (H) 12/29/2021   CREATININE 1.61 (H) 12/29/2021   CALCIUM 10.3 12/29/2021   GFRNONAA 59 (L) 12/29/2021   Lab  Results  Component Value Date   WBC 19.9 (H) 12/29/2021   HGB 16.6 12/29/2021   HCT 49.2 12/29/2021   MCV 85.7 12/29/2021   PLT 400 12/29/2021   EKG: sinus tach, minimal st elevation V2  Assessment and Plan: AKI     - place in obs, tele     - continue fluids     - check renal US     - watch nephrotoxins  Rhabdomyolysis     - fluids, trend CPK  Suicidal ideation     - psyc consult     - voluntarily presented for suicidal ideation     - sitter  Bipolar d/o     - continue home regimen when confirmed  Methamphetamine abuse     - counsel against further use  Tobacco abuse     - counsel against further use  Advance Care Planning:   Code Status: FULL  Consults: psyc  Family Communication: None at bedside  Severity of Illness: The appropriate patient status for this patient is OBSERVATION. Observation status is judged to be reasonable and necessary in order to provide the required intensity of service to ensure the patient's safety. The patient's presenting symptoms, physical exam findings, and initial radiographic and laboratory data in the context of their medical condition is felt to place them at decreased risk for further clinical deterioration. Furthermore, it is anticipated that the patient will be medically stable for discharge from the hospital within 2 midnights of admission.   Time spent in coordination of this H&P: 60 minutes  Author: Teddy Spike, DO 12/29/2021 8:06 AM  For on call review [email or web address].

## 2021-12-29 NOTE — ED Triage Notes (Signed)
[facility] for si and suicide attempt. Pt has multiple superficial lacerations to both forearms which he states are self-inflicted with a boc cutter.. Reports hx si/depression but sts "this is the farthest it's gotten." Pt sts he relapsed today snorting meth and possibly heroin  approx 4hrs ago, at which point he felt extreme disappointment and cut himself. Pt appears sad during triage, avoids eye contact, calm and cooperative. Vss. Nadn.

## 2021-12-29 NOTE — ED Provider Notes (Signed)
[facility]-EMERGENCY DEPT Provider Note   CSN: [id number] Arrival date & time: 12/29/21  0407     History  Chief Complaint  Patient presents with   Suicidal    Nathan Koch is a 31 y.o. male.  The history is provided by the patient and the EMS personnel.  Nathan Koch is a 31 y.o. male who presents to the Emergency Department complaining of suicidal.  He presents to the emergency department by EMS for evaluation of suicide attempt.  He states that he decided he wanted to kill himself after relapsing on meth about 4 hours prior to arrival.  He states that he snorted it and thinks that it was cut with fentanyl because he started feeling sick and had an episode of emesis.  He was depressed because he relapsed on meth and cut himself with a box cutter on his left arm and right leg.  He states that he lost access to housing 3 days ago and has been living outside.  He has a history of depression, PTSD and anxiety.  He denies taking any additional drugs.  He does report decreased oral intake for the last 3 days.     Home Medications Prior to Admission medications   Medication Sig Start Date End Date Taking? Authorizing Provider  albuterol (VENTOLIN HFA) 108 (90 Base) MCG/ACT inhaler Inhale 2 puffs into the lungs every 6 (six) hours as needed for shortness of breath. 12/11/21  Yes Clapacs, Jackquline Denmark, MD  hydrOXYzine (ATARAX) 50 MG tablet Take 1 tablet (50 mg total) by mouth 3 (three) times daily as needed for anxiety. 12/11/21  Yes Clapacs, Jackquline Denmark, MD  lamoTRIgine (LAMICTAL) 25 MG tablet Take 1 tablet (25 mg total) by mouth daily. 12/12/21  Yes Clapacs, Jackquline Denmark, MD  QUEtiapine (SEROQUEL) 100 MG tablet Take 1 tablet (100 mg total) by mouth at bedtime. Patient not taking: Reported on 12/29/2021 12/11/21   Clapacs, Jackquline Denmark, MD  traZODone (DESYREL) 100 MG tablet Take 1 tablet (100 mg total) by mouth at bedtime as needed for sleep. Patient not taking: Reported on 12/29/2021 12/11/21    Clapacs, Jackquline Denmark, MD      Allergies    Latex    Review of Systems   Review of Systems  All other systems reviewed and are negative.   Physical Exam Updated Vital Signs BP (!) 130/90   Pulse (!) 108   Temp 98.6 F (37 C) (Oral)   Resp 19   Ht 5\' 8"  (1.727 m)   Wt 104.3 kg   SpO2 100%   BMI 34.97 kg/m  Physical Exam Vitals and nursing note reviewed.  Constitutional:      Appearance: He is well-developed.  HENT:     Head: Normocephalic and atraumatic.  Cardiovascular:     Rate and Rhythm: Regular rhythm. Tachycardia present.     Heart sounds: No murmur heard. Pulmonary:     Effort: Pulmonary effort is normal. No respiratory distress.     Breath sounds: Normal breath sounds.  Abdominal:     Palpations: Abdomen is soft.     Tenderness: There is no abdominal tenderness. There is no guarding or rebound.  Musculoskeletal:        General: No tenderness.     Comments: Multiple superficial abrasions to the left upper extremity as well as the right leg.  There are numerous bug bites to all 4 extremities.  Skin:    General: Skin is warm and dry.  Neurological:     Mental Status: He is alert and oriented to person, place, and time.  Psychiatric:        Behavior: Behavior normal.     ED Results / Procedures / Treatments   Labs (all labs ordered are listed, but only abnormal results are displayed) Labs Reviewed  COMPREHENSIVE METABOLIC PANEL - Abnormal; Notable for the following components:      Result Value   CO2 17 (*)    Glucose, Bld 131 (*)    BUN 40 (*)    Creatinine, Ser 1.61 (*)    Total Protein 9.6 (*)    Albumin 5.3 (*)    AST 98 (*)    Total Bilirubin 2.8 (*)    GFR, Estimated 59 (*)    All other components within normal limits  SALICYLATE LEVEL - Abnormal; Notable for the following components:   Salicylate Lvl <7.0 (*)    All other components within normal limits  ACETAMINOPHEN LEVEL - Abnormal; Notable for the following components:   Acetaminophen  (Tylenol), Serum <10 (*)    All other components within normal limits  CBC - Abnormal; Notable for the following components:   WBC 19.9 (*)    All other components within normal limits  RAPID URINE DRUG SCREEN, HOSP PERFORMED - Abnormal; Notable for the following components:   Amphetamines POSITIVE (*)    All other components within normal limits  ETHANOL  CK  URINALYSIS, ROUTINE W REFLEX MICROSCOPIC    EKG EKG Interpretation  Date/Time:  Monday December 29 2021 04:52:44 EDT Ventricular Rate:  110 PR Interval:  144 QRS Duration: 78 QT Interval:  332 QTC Calculation: 450 R Axis:   93 Text Interpretation: Sinus tachycardia Biatrial enlargement Borderline right axis deviation ST elev, probable normal early repol pattern Confirmed by Tilden Fossa [phone]) on 12/29/2021 5:27:20 AM  Radiology No results found.  Procedures Procedures    Medications Ordered in ED Medications  ondansetron (ZOFRAN-ODT) disintegrating tablet 4 mg (4 mg Oral Given 12/29/21 0514)  sodium chloride 0.9 % bolus 1,000 mL (0 mLs Intravenous Stopped 12/29/21 0610)    Followed by  sodium chloride 0.9 % bolus 1,000 mL (1,000 mLs Intravenous New Bag/Given 12/29/21 0547)    Followed by  0.9 %  sodium chloride infusion (1,000 mLs Intravenous New Bag/Given 12/29/21 0549)    ED Course/ Medical Decision Making/ A&P                           Medical Decision Making Amount and/or Complexity of Data Reviewed Labs: ordered.  Risk Prescription drug management. Decision regarding hospitalization.   Patient brought to the emergency department by EMS voluntarily for evaluation of SI after relapsing on methamphetamine and self-harm attempt with cutting.  In terms of cutting attempts, wounds are superficial, no need for repair.  Labs significant for AKI with elevation in creatinine to 1.63, elevation in BUN to 40.  Patient recently admitted with similar lab profile in setting of rhabdomyolysis.  He was started on IV fluid  hydration.  Discussed with patient findings of studies and recommendation for admission for ongoing treatment. Medicine consulted for admission.          Final Clinical Impression(s) / ED Diagnoses Final diagnoses:  Suicidal ideation  AKI (acute kidney injury) ([name])  Methamphetamine abuse ([name])  Non-traumatic rhabdomyolysis    Rx / DC Orders ED Discharge Orders     None  Tilden Fossa, MD 12/29/21 [phone]

## 2021-12-30 ENCOUNTER — Inpatient Hospital Stay
Admission: AD | Admit: 2021-12-30 | Discharge: 2022-01-07 | DRG: 885 | Disposition: A | Discharge status: Home / Self Care | Payer: 59 | Source: Intra-hospital | Attending: Psychiatry | Admitting: Psychiatry

## 2021-12-30 ENCOUNTER — Encounter: Payer: Self-pay | Admitting: Psychiatry

## 2021-12-30 ENCOUNTER — Other Ambulatory Visit: Payer: Self-pay

## 2021-12-30 DIAGNOSIS — X789XXA Intentional self-harm by unspecified sharp object, initial encounter: Secondary | ICD-10-CM

## 2021-12-30 DIAGNOSIS — J45909 Unspecified asthma, uncomplicated: Secondary | ICD-10-CM | POA: Diagnosis present

## 2021-12-30 DIAGNOSIS — Z8249 Family history of ischemic heart disease and other diseases of the circulatory system: Secondary | ICD-10-CM

## 2021-12-30 DIAGNOSIS — Z79899 Other long term (current) drug therapy: Secondary | ICD-10-CM

## 2021-12-30 DIAGNOSIS — R45851 Suicidal ideations: Secondary | ICD-10-CM | POA: Diagnosis present

## 2021-12-30 DIAGNOSIS — Z825 Family history of asthma and other chronic lower respiratory diseases: Secondary | ICD-10-CM | POA: Diagnosis not present

## 2021-12-30 DIAGNOSIS — F17210 Nicotine dependence, cigarettes, uncomplicated: Secondary | ICD-10-CM | POA: Diagnosis present

## 2021-12-30 DIAGNOSIS — F1520 Other stimulant dependence, uncomplicated: Secondary | ICD-10-CM | POA: Diagnosis present

## 2021-12-30 DIAGNOSIS — Y929 Unspecified place or not applicable: Secondary | ICD-10-CM | POA: Diagnosis not present

## 2021-12-30 DIAGNOSIS — F332 Major depressive disorder, recurrent severe without psychotic features: Principal | ICD-10-CM | POA: Diagnosis present

## 2021-12-30 DIAGNOSIS — F4310 Post-traumatic stress disorder, unspecified: Secondary | ICD-10-CM | POA: Diagnosis present

## 2021-12-30 DIAGNOSIS — S61512A Laceration without foreign body of left wrist, initial encounter: Secondary | ICD-10-CM | POA: Diagnosis present

## 2021-12-30 DIAGNOSIS — F319 Bipolar disorder, unspecified: Secondary | ICD-10-CM | POA: Diagnosis present

## 2021-12-30 LAB — COMPREHENSIVE METABOLIC PANEL
ALT: 35 U/L (ref 0–44)
AST: 69 U/L — ABNORMAL HIGH (ref 15–41)
Albumin: 3 g/dL — ABNORMAL LOW (ref 3.5–5.0)
Alkaline Phosphatase: 47 U/L (ref 38–126)
Anion gap: 3 — ABNORMAL LOW (ref 5–15)
BUN: 16 mg/dL (ref 6–20)
CO2: 24 mmol/L (ref 22–32)
Calcium: 8.2 mg/dL — ABNORMAL LOW (ref 8.9–10.3)
Chloride: 115 mmol/L — ABNORMAL HIGH (ref 98–111)
Creatinine, Ser: 0.79 mg/dL (ref 0.61–1.24)
GFR, Estimated: >60 mL/min (ref >60)
Glucose, Bld: 140 mg/dL — ABNORMAL HIGH (ref 70–99)
Potassium: 3.9 mmol/L (ref 3.5–5.1)
Sodium: 142 mmol/L (ref 135–145)
Total Bilirubin: 0.9 mg/dL (ref 0.3–1.2)
Total Protein: 5.8 g/dL — ABNORMAL LOW (ref 6.5–8.1)

## 2021-12-30 LAB — CBC
HCT: 38.4 % — ABNORMAL LOW (ref 39.0–52.0)
Hemoglobin: 12.5 g/dL — ABNORMAL LOW (ref 13.0–17.0)
MCH: 28.8 pg (ref 26.0–34.0)
MCHC: 32.6 g/dL (ref 30.0–36.0)
MCV: 88.5 fL (ref 80.0–100.0)
Platelets: 197 10*3/uL (ref 150–400)
RBC: 4.34 MIL/uL (ref 4.22–5.81)
RDW: 13.7 % (ref 11.5–15.5)
WBC: 6.5 10*3/uL (ref 4.0–10.5)
nRBC: 0 % (ref 0.0–0.2)

## 2021-12-30 LAB — SARS CORONAVIRUS 2 BY RT PCR: SARS Coronavirus 2 by RT PCR: NEGATIVE

## 2021-12-30 LAB — CK: Total CK: 3547 U/L — ABNORMAL HIGH (ref 49–397)

## 2021-12-30 MED ORDER — NICOTINE 14 MG/24HR TD PT24
14.0000 mg | MEDICATED_PATCH | Freq: Every day | TRANSDERMAL | Status: DC
  Filled 2021-12-30 (×3): qty 1

## 2021-12-30 MED ORDER — MAGNESIUM HYDROXIDE 400 MG/5ML PO SUSP: 30.0000 mL | Freq: Every day | ORAL | Status: DC | PRN

## 2021-12-30 MED ORDER — ALUM & MAG HYDROXIDE-SIMETH 200-200-20 MG/5ML PO SUSP: 30.0000 mL | ORAL | Status: DC | PRN

## 2021-12-30 MED ORDER — HYDROCORTISONE 0.5 % EX CREA
TOPICAL_CREAM | Freq: Two times a day (BID) | CUTANEOUS | Status: DC
Start: 2021-12-31 — End: 2022-01-07
  Filled 2021-12-30: qty 28.35

## 2021-12-30 MED ORDER — ALBUTEROL SULFATE HFA 108 (90 BASE) MCG/ACT IN AERS: 2.0000 | INHALATION_SPRAY | Freq: Four times a day (QID) | RESPIRATORY_TRACT | Status: DC | PRN

## 2021-12-30 MED ORDER — QUETIAPINE FUMARATE 100 MG PO TABS
100.0000 mg | ORAL_TABLET | Freq: Every day | ORAL | Status: DC
  Administered 2021-12-30 – 2022-01-03 (×5): 100 mg via ORAL
  Filled 2021-12-30 (×5): qty 1

## 2021-12-30 MED ORDER — HYDROXYZINE HCL 50 MG PO TABS
50.0000 mg | ORAL_TABLET | Freq: Four times a day (QID) | ORAL | Status: DC | PRN
  Administered 2021-12-30 – 2022-01-06 (×6): 50 mg via ORAL
  Filled 2021-12-30 (×8): qty 1

## 2021-12-30 MED ORDER — ACETAMINOPHEN 325 MG PO TABS
650.0000 mg | ORAL_TABLET | Freq: Four times a day (QID) | ORAL | Status: DC | PRN
Start: 2021-12-30 — End: 2022-01-07
  Administered 2022-01-01 – 2022-01-05 (×4): 650 mg via ORAL
  Filled 2021-12-30 (×4): qty 2

## 2021-12-30 NOTE — Consult Note (Signed)
[facility] Face-to-Face Psychiatry Consult   Reason for Consult:  Suicidal Ideations Referring Physician:  Margie EgeKyle Koch Patient Identification: Nathan RomansMickey L Koch MRN:  [id number] Principal Diagnosis: AKI (acute kidney injury) ([name]) Diagnosis:  Principal Problem:   AKI (acute kidney injury) ([name]) Active Problems:   Methamphetamine use disorder, severe ([name])   Suicidal ideation   Bipolar 1 disorder, depressed ([name])   Tobacco use disorder   Rhabdomyolysis   Total Time spent with patient: 15 minutes  Subjective:   Nathan RomansMickey L Koch is a 31 y.o. male was seen and evaluated face-to-face.  He presents with a bright and pleasant affect.  Continues to endorse suicidal ideations with plan and intent.  States he feels like " life is not worth living" due to his chronic relapse.  "  Why cannot get it together."  States he does not think that he is able to return back to the [place] [facility] due to a recent relapse and additionally he states that he owes a small fee. Nathan Koch reported he had plans to follow-up with [facility] residential services, however does not have [facility] [place].  Denied cravings for alcohol and/or substance abuse currently.  States he is resting well.  Reports a good appetite.  Case staffed with attending psychiatrist MD Lucianne MussKumar CSW to continue seeking inpatient admission once medically cleared.Marland Kitchen.   HPI:  Per admission assessment note: "Nathan RomansMickey L Koch is a 31 y.o. male patient admitted with ."My mental status is deteriorating. I had the money to buy food and drinks and I didn't. I actually tried to kill myself this time. Its catching up with you, my kidneys are failing." Endorses ongoing suicidal thoughts with a plan to cut with box cutter. He reports the cops have interrupted by police. "  I liked that box cutter too.  I wonder if they will give him a box cutter back."   Past Psychiatric History:   Risk to Self:   Risk to Others:   Prior Inpatient Therapy:   Prior  Outpatient Therapy:    Past Medical History:  Past Medical History:  Diagnosis Date   Anxiety    Anxiety disorder    Asthma    Bipolar 1 disorder ([name])    Depression    Methamphetamine abuse ([name])    PTSD (post-traumatic stress disorder)     Past Surgical History:  Procedure Laterality Date   CLOSED REDUCTION MANDIBLE N/A 07/02/2018   Procedure: CLOSED REDUCTION MANDIBULAR;  Surgeon: Vernie MurdersJuengel, Paul, MD;  Location: [facility] ORS;  Service: ENT;  Laterality: N/A;   Family History:  Family History  Problem Relation Age of Onset   Asthma Mother    Cancer Mother        breast cancer   Ulcers Mother    Cancer Father        esophogeal cancer   Hypertension Father    Asthma Brother    Family Psychiatric  History:  Social History:  Social History   Substance and Sexual Activity  Alcohol Use Not Currently   Alcohol/week: 13.0 standard drinks of alcohol   Types: 6 Cans of beer, 7 Standard drinks or equivalent per week   Comment: previously heavy drinker 2016. most recent 4 beer/ day drinker and none since 01-11-2020     Social History   Substance and Sexual Activity  Drug Use Yes   Types: Methamphetamines   Comment: states he uses 1 g every other day    Social History  Socioeconomic History   Marital status: Single    Spouse name: Not on file   Number of children: 1   Years of education: 10   Highest education level: 10th grade  Occupational History   Not on file  Tobacco Use   Smoking status: Every Day    Packs/day: 0.50    Years: 8.00    Total pack years: 4.00    Types: Cigarettes   Smokeless tobacco: Never  Vaping Use   Vaping Use: Every day   Substances: Nicotine, Flavoring  Substance and Sexual Activity   Alcohol use: Not Currently    Alcohol/week: 13.0 standard drinks of alcohol    Types: 6 Cans of beer, 7 Standard drinks or equivalent per week    Comment: previously heavy drinker 2016. most recent 4 beer/ day drinker and none since 01-11-2020   Drug use:  Yes    Types: Methamphetamines    Comment: states he uses 1 g every other day   Sexual activity: Not on file  Other Topics Concern   Not on file  Social History Narrative   Not on file   Social Determinants of Health   Financial Resource Strain: Not on file  Food Insecurity: Not on file  Transportation Needs: Not on file  Physical Activity: Not on file  Stress: Not on file  Social Connections: Not on file   Additional Social History:    Allergies:   Allergies  Allergen Reactions   Latex Hives    Labs:  Results for orders placed or performed during the hospital encounter of 12/29/21 (from the past 48 hour(s))  Comprehensive metabolic panel     Status: Abnormal   Collection Time: 12/29/21  4:38 AM  Result Value Ref Range   Sodium 142 135 - 145 mmol/L   Potassium 4.3 3.5 - 5.1 mmol/L   Chloride 110 98 - 111 mmol/L   CO2 17 (L) 22 - 32 mmol/L   Glucose, Bld 131 (H) 70 - 99 mg/dL    Comment: Glucose reference range applies only to samples taken after fasting for at least 8 hours.   BUN 40 (H) 6 - 20 mg/dL   Creatinine, Ser 4.19 (H) 0.61 - 1.24 mg/dL   Calcium 37.9 8.9 - 02.4 mg/dL   Total Protein 9.6 (H) 6.5 - 8.1 g/dL   Albumin 5.3 (H) 3.5 - 5.0 g/dL   AST 98 (H) 15 - 41 U/L   ALT 33 0 - 44 U/L   Alkaline Phosphatase 78 38 - 126 U/L   Total Bilirubin 2.8 (H) 0.3 - 1.2 mg/dL   GFR, Estimated 59 (L) >60 mL/min    Comment: (NOTE) Calculated using the CKD-EPI Creatinine Equation (2021)    Anion gap 15 5 - 15    Comment: Performed at [facility], [address], [place], [place] [address]  Ethanol     Status: None   Collection Time: 12/29/21  4:38 AM  Result Value Ref Range   Alcohol, Ethyl (B) <10 <10 mg/dL    Comment: (NOTE) Lowest detectable limit for serum alcohol is 10 mg/dL.  For medical purposes only. Performed at [facility], [address] [place], [place] [address]   Salicylate level     Status: Abnormal    Collection Time: 12/29/21  4:38 AM  Result Value Ref Range   Salicylate Lvl <7.0 (L) 7.0 - 30.0 mg/dL    Comment: Performed at [facility] [place], [address] [place],  [place] [address]  Acetaminophen level     Status: Abnormal   Collection Time: 12/29/21  4:38 AM  Result Value Ref Range   Acetaminophen (Tylenol), Serum <10 (L) 10 - 30 ug/mL    Comment: (NOTE) Therapeutic concentrations vary significantly. A range of 10-30 ug/mL  may be an effective concentration for many patients. However, some  are best treated at concentrations outside of this range. Acetaminophen concentrations >150 ug/mL at 4 hours after ingestion  and >50 ug/mL at 12 hours after ingestion are often associated with  toxic reactions.  Performed at [facility], [address], [place], [place] [address]   cbc     Status: Abnormal   Collection Time: 12/29/21  4:38 AM  Result Value Ref Range   WBC 19.9 (H) 4.0 - 10.5 K/uL   RBC 5.74 4.22 - 5.81 MIL/uL   Hemoglobin 16.6 13.0 - 17.0 g/dL   HCT 82.6 41.5 - 83.0 %   MCV 85.7 80.0 - 100.0 fL   MCH 28.9 26.0 - 34.0 pg   MCHC 33.7 30.0 - 36.0 g/dL   RDW 94.0 76.8 - 08.8 %   Platelets 400 150 - 400 K/uL   nRBC 0.0 0.0 - 0.2 %    Comment: Performed at [facility], [address], [place], [place] [address]  CK     Status: Abnormal   Collection Time: 12/29/21  4:38 AM  Result Value Ref Range   Total CK 7,532 (H) 49 - 397 U/L    Comment: RESULTS CONFIRMED BY MANUAL DILUTION Performed at [facility], [address], [place], [place] [address]   Rapid urine drug screen (hospital performed)     Status: Abnormal   Collection Time: 12/29/21  4:49 AM  Result Value Ref Range   Opiates NONE DETECTED NONE DETECTED   Cocaine NONE DETECTED NONE DETECTED   Benzodiazepines NONE DETECTED NONE DETECTED   Amphetamines POSITIVE (A) NONE DETECTED   Tetrahydrocannabinol NONE DETECTED NONE DETECTED    Barbiturates NONE DETECTED NONE DETECTED    Comment: (NOTE) DRUG SCREEN FOR MEDICAL PURPOSES ONLY.  IF CONFIRMATION IS NEEDED FOR ANY PURPOSE, NOTIFY LAB WITHIN 5 DAYS.  LOWEST DETECTABLE LIMITS FOR URINE DRUG SCREEN Drug Class                     Cutoff (ng/mL) Amphetamine and metabolites    1000 Barbiturate and metabolites    200 Benzodiazepine                 200 Tricyclics and metabolites     300 Opiates and metabolites        300 Cocaine and metabolites        300 THC                            50 Performed at [facility], [address] [place], [place] [address]   Urinalysis, Routine w reflex microscopic     Status: Abnormal   Collection Time: 12/29/21  4:49 AM  Result Value Ref Range   Color, Urine AMBER (A) YELLOW    Comment: BIOCHEMICALS MAY BE AFFECTED BY COLOR   APPearance HAZY (A) CLEAR   Specific Gravity, Urine 1.016 1.005 - 1.030   pH 5.0 5.0 - 8.0   Glucose, UA NEGATIVE NEGATIVE mg/dL   Hgb urine dipstick MODERATE (A) NEGATIVE   Bilirubin Urine NEGATIVE NEGATIVE  Ketones, ur 5 (A) NEGATIVE mg/dL   Protein, ur 053 (A) NEGATIVE mg/dL   Nitrite NEGATIVE NEGATIVE   Leukocytes,Ua NEGATIVE NEGATIVE   RBC / HPF 0-5 0 - 5 RBC/hpf   WBC, UA 11-20 0 - 5 WBC/hpf   Bacteria, UA RARE (A) NONE SEEN   Squamous Epithelial / LPF 0-5 0 - 5   Mucus PRESENT    Hyaline Casts, UA PRESENT    Sperm, UA PRESENT     Comment: Performed at [facility], [address] [place], [place] [address]  Comprehensive metabolic panel     Status: Abnormal   Collection Time: 12/30/21  4:46 AM  Result Value Ref Range   Sodium 142 135 - 145 mmol/L   Potassium 3.9 3.5 - 5.1 mmol/L   Chloride 115 (H) 98 - 111 mmol/L   CO2 24 22 - 32 mmol/L   Glucose, Bld 140 (H) 70 - 99 mg/dL    Comment: Glucose reference range applies only to samples taken after fasting for at least 8 hours.   BUN 16 6 - 20 mg/dL   Creatinine, Ser 4.19 0.61 - 1.24 mg/dL   Calcium  8.2 (L) 8.9 - 10.3 mg/dL    Comment: DELTA CHECK NOTED   Total Protein 5.8 (L) 6.5 - 8.1 g/dL   Albumin 3.0 (L) 3.5 - 5.0 g/dL   AST 69 (H) 15 - 41 U/L   ALT 35 0 - 44 U/L   Alkaline Phosphatase 47 38 - 126 U/L   Total Bilirubin 0.9 0.3 - 1.2 mg/dL   GFR, Estimated >37 >90 mL/min    Comment: (NOTE) Calculated using the CKD-EPI Creatinine Equation (2021)    Anion gap 3 (L) 5 - 15    Comment: Performed at [facility], [address], [place], [place] [address]  CK     Status: Abnormal   Collection Time: 12/30/21  4:46 AM  Result Value Ref Range   Total CK 3,547 (H) 49 - 397 U/L    Comment: Performed at [facility], [address], [place], [place] [address]  CBC     Status: Abnormal   Collection Time: 12/30/21  7:28 AM  Result Value Ref Range   WBC 6.5 4.0 - 10.5 K/uL   RBC 4.34 4.22 - 5.81 MIL/uL   Hemoglobin 12.5 (L) 13.0 - 17.0 g/dL   HCT 92.4 (L) 26.8 - 34.1 %   MCV 88.5 80.0 - 100.0 fL   MCH 28.8 26.0 - 34.0 pg   MCHC 32.6 30.0 - 36.0 g/dL   RDW 96.2 22.9 - 79.8 %   Platelets 197 150 - 400 K/uL   nRBC 0.0 0.0 - 0.2 %    Comment: Performed at [facility], [address], [place], [place] [address]    Current Facility-Administered Medications  Medication Dose Route Frequency Provider Last Rate Last Admin   acetaminophen (TYLENOL) tablet 650 mg  650 mg Oral Q6H PRN Ronaldo Miyamoto, Koch A, DO   650 mg at 12/29/21 1221   Or   acetaminophen (TYLENOL) suppository 650 mg  650 mg Rectal Q6H PRN Ronaldo Miyamoto, Koch A, DO       albuterol (VENTOLIN HFA) 108 (90 Base) MCG/ACT inhaler 2 puff  2 puff Inhalation Q6H PRN Ronaldo Miyamoto, Koch A, DO       hydrocortisone cream 0.5 %   Topical BID Josph Macho, MD   Given at 12/30/21 1036   ondansetron (ZOFRAN) tablet 4 mg  4 mg Oral Q6H PRN Ronaldo Miyamoto, Koch A, DO       Or   ondansetron (ZOFRAN) injection 4 mg  4 mg Intravenous Q6H PRN Ronaldo Miyamoto, Koch A, DO       QUEtiapine (SEROQUEL) tablet 100 mg  100 mg  Oral QHS Nathan, Koch A, DO   100 mg at 12/29/21 2154    Musculoskeletal: Patient observed resting in bed  Psychiatric Specialty Exam:  Presentation  General Appearance: Appropriate for Environment  Eye Contact:Good  Speech:Clear and Coherent  Speech Volume:Normal  Handedness:Right   Mood and Affect  Mood:Euthymic  Affect:Congruent   Thought Process  Thought Processes:Coherent  Descriptions of Associations:Intact  Orientation:Full (Time, Place and Person)  Thought Content:Logical  History of Schizophrenia/Schizoaffective disorder:No  Duration of Psychotic Symptoms:N/A  Hallucinations:No data recorded Ideas of Reference:None  Suicidal Thoughts:No data recorded Homicidal Thoughts:No data recorded  Sensorium  Memory:Immediate Good; Recent Good  Judgment:Fair  Insight:Fair   Executive Functions  Concentration:Fair  Attention Span:Good  Recall:Good  Fund of Knowledge:Fair  Language:Fair   Psychomotor Activity  Psychomotor Activity:No data recorded  Assets  Assets:Desire for Improvement; Resilience; Physical Health   Sleep  Sleep:No data recorded  Physical Exam: Physical Exam Vitals and nursing note reviewed.  Cardiovascular:     Rate and Rhythm: Normal rate.  Pulmonary:     Effort: Pulmonary effort is normal.  Psychiatric:        Mood and Affect: Mood normal.        Behavior: Behavior normal.        Thought Content: Thought content normal.    ROS Blood pressure (!) 109/54, pulse 61, temperature 97.7 F (36.5 C), temperature source Oral, resp. rate 18, height 5\' 8"  (1.727 m), weight 104.3 kg, SpO2 100 %. Body mass index is 34.97 kg/m.  Treatment Plan Summary: Daily contact with patient to assess and evaluate symptoms and progress in treatment and Medication management  Disposition: Recommend psychiatric Inpatient admission when medically cleared.  Requested a referral to be sent for [facility] once medically cleared   [facility], NP 12/30/2021 1:20 PM

## 2021-12-30 NOTE — TOC Initial Note (Signed)
Transition of Care [facility]) - Initial/Assessment Note    Patient Details  Name: Nathan Koch MRN: [id number] Date of Birth: [birth year]  [facility]) CM/SW Contact:    Otelia Santee, LCSW Phone Number: 12/30/2021, 2:06 PM  Clinical Narrative:                 Pt requiring psychiatric admission due to suicidal ideations. Currently no beds available at [facility]. Pt has been referred to [facility].   Expected Discharge Plan: Psychiatric Hospital Barriers to Discharge: No Barriers Identified   Patient Goals and CMS Choice        Expected Discharge Plan and Services Expected Discharge Plan: Psychiatric Hospital In-house Referral: Clinical Social Work Discharge Planning Services: CM Consult Post Acute Care Choice: NA Living arrangements for the past 2 months: Homeless                 DME Arranged: N/A DME Agency: NA                  Prior Living Arrangements/Services Living arrangements for the past 2 months: Homeless Lives with:: Self Patient language and need for interpreter reviewed:: Yes Do you feel safe going back to the place where you live?: Yes      Need for Family Participation in Patient Care: No (Comment) Care giver support system in place?: No (comment)   Criminal Activity/Legal Involvement Pertinent to Current Situation/Hospitalization: No - Comment as needed  Activities of Daily Living      Permission Sought/Granted   Permission granted to share information with : No              Emotional Assessment   Attitude/Demeanor/Rapport: Unable to Assess Affect (typically observed): Unable to Assess Orientation: : Oriented to Self, Oriented to Place, Oriented to  Time, Oriented to Situation, Fluctuating Orientation (Suspected and/or reported Sundowners) Alcohol / Substance Use: Alcohol Use, Illicit Drugs Psych Involvement: Yes (comment)  Admission diagnosis:  Suicidal ideation [R45.851] Methamphetamine abuse ([name]) [F15.10] AKI (acute kidney  injury) ([name]) [N17.9] Non-traumatic rhabdomyolysis [M62.82] Patient Active Problem List   Diagnosis Date Noted   Rhabdomyolysis 11/30/2021   AKI (acute kidney injury) ([name]) 11/30/2021   Methamphetamine intoxication ([name]) 11/30/2021   Elevated LFTs 11/30/2021   Passive suicidal ideations 04/16/2021   Homelessness 04/16/2021   Substance induced mood disorder ([name]) 04/16/2021   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   Severe recurrent major depression without psychotic features ([name]) 01/16/2021   Bipolar 1 disorder, depressed ([name])    MDD (major depressive disorder), recurrent episode, severe ([name]) 09/11/2020   MDD (major depressive disorder), recurrent severe, without psychosis ([name]) 09/10/2020   Suicidal ideation    Depression, major, recurrent, 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 [place] [address] Phone: [phone] Fax: [phone]  [facility] [address] [place] [address] Phone: [phone] Fax: [phone]     Social Determinants of Health (SDOH) Interventions    Readmission Risk Interventions     No data to display

## 2021-12-30 NOTE — Discharge Summary (Signed)
Physician Discharge Summary  Nathan Koch ZOX:[id number] DOB: 09-Aug-1990 DOA: 12/29/2021  PCP: Pcp, No  Admit date: 12/29/2021 Discharge date: 12/30/2021 Discharging to: Behavioral health unil    Consults:  Psychiatry      Discharge Diagnoses:   Principal Problem:   AKI (acute kidney injury) ([name]) Active Problems:   Rhabdomyolysis   Methamphetamine use disorder, severe ([name])   Suicidal ideation   Bipolar 1 disorder, depressed [facility])       Hospital Course:  This is a 31 year old male with history of bipolar disorder and polysubstance abuse who presented to the hospital for depression and suicidal intentions. He relapsed on Meth yesterday and snorted it. It is possible there was Fentanyl in it per history.  He tells me he has been in a shelter but told the ED MD that he has been living outside for 3 days  He was found to have AKI and rhabdomyolysis and was admitted to the medical service.  Principal Problem:   AKI (acute kidney injury) ([name]) -Has improved with IV fluids and Cr is back to baseline -of about 0.7 -He states he is eating and drinking well  Active Problems:   Rhabdomyolysis - ? Due to self inflicted injuries (cut himself) - CK improved to 3547 and at this point I feel it would be safe for him to come off of IV fluids  Leukocytosis - likely stress induced- resolved w/o antibiotics     Methamphetamine use disorder, severe ([name]) -He admits to methamphetamine use to me- chart states he possibly used Heroin -He does state that he missed 2 days of his home medications but restarted them yesterday    Suicidal ideation   Bipolar 1 disorder, depressed ([name]) Is going to be transferred to the behavioral medicine unit   Obesity  Body mass index is 34.97 kg/m.           Discharge Instructions  Discharge Instructions     Diet - low sodium heart healthy   Complete by: As directed    Increase activity slowly   Complete by: As directed       Allergies as  of 12/30/2021       Reactions   Latex Hives        Medication List     TAKE these medications    albuterol 108 (90 Base) MCG/ACT inhaler Commonly known as: VENTOLIN HFA Inhale 2 puffs into the lungs every 6 (six) hours as needed for shortness of breath.   hydrOXYzine 50 MG tablet Commonly known as: ATARAX Take 1 tablet (50 mg total) by mouth 3 (three) times daily as needed for anxiety.   lamoTRIgine 25 MG tablet Commonly known as: LAMICTAL Take 1 tablet (25 mg total) by mouth daily.   QUEtiapine 100 MG tablet Commonly known as: SEROQUEL Take 1 tablet (100 mg total) by mouth at bedtime.   traZODone 100 MG tablet Commonly known as: DESYREL Take 1 tablet (100 mg total) by mouth at bedtime as needed for sleep.            The results of significant diagnostics from this hospitalization (including imaging, microbiology, ancillary and laboratory) are listed below for reference.    US RENAL  Result Date: 12/29/2021 CLINICAL DATA:  Acute kidney injury. EXAM: RENAL / URINARY TRACT ULTRASOUND COMPLETE COMPARISON:  CT, 10/28/2020. FINDINGS: Right Kidney: Renal measurements: 10.1 x 4.1 x 5.3 cm = volume: 115 mL. Echogenicity within normal limits. No mass or hydronephrosis visualized. Left Kidney: Renal measurements: 10.8 x  5.5 x 5.3 cm = volume: 165 mL. Echogenicity within normal limits. No mass or hydronephrosis visualized. Bladder: Appears normal for degree of bladder distention. Other: None. IMPRESSION: Normal renal ultrasound. Electronically Signed   By: Amie Portland M.D.   On: 12/29/2021 14:57   Labs:   Basic Metabolic Panel: Recent Labs  Lab 12/29/21 0438 12/30/21 0446  NA 142 142  K 4.3 3.9  CL 110 115*  CO2 17* 24  GLUCOSE 131* 140*  BUN 40* 16  CREATININE 1.61* 0.79  CALCIUM 10.3 8.2*     CBC: Recent Labs  Lab 12/29/21 0438 12/30/21 0728  WBC 19.9* 6.5  HGB 16.6 12.5*  HCT 49.2 38.4*  MCV 85.7 88.5  PLT 400 197         SIGNED:   Calvert Cantor,  MD  [facility] 12/30/2021, 2:20 PM

## 2021-12-30 NOTE — Tx Team (Signed)
Initial Treatment Plan 12/30/2021 10:17 PM [name] [id number]    PATIENT STRESSORS: Health problems   Medication change or noncompliance   Substance abuse     PATIENT STRENGTHS: Ability for insight  Active sense of humor  Motivation for treatment/growth    PATIENT IDENTIFIED PROBLEMS: Suicidal Ideation  Substance Abuse  Anxiety                 DISCHARGE CRITERIA:  Adequate post-discharge living arrangements Improved stabilization in mood, thinking, and/or behavior Verbal commitment to aftercare and medication compliance  PRELIMINARY DISCHARGE PLAN: Outpatient therapy Placement in alternative living arrangements  PATIENT/FAMILY INVOLVEMENT: This treatment plan has been presented to and reviewed with the patient, Nathan Koch. The patient has been given the opportunity to ask questions and make suggestions.  Elmyra Ricks, RN 12/30/2021, 10:17 PM

## 2021-12-30 NOTE — BH Assessment (Addendum)
Patient is to be admitted to [facility] BMU tonight 12/30/21 after 8:00pm by Dr. Toni Amend.  Attending Physician will be Dr.  Toni Amend .   Patient has been assigned to room 305, by [facility] Charge Nurse, Maryelizabeth Kaufmann.     ER staff is aware of the admission:   Sue Lush, Patient Access.   ** [phone] **

## 2021-12-30 NOTE — TOC Transition Note (Signed)
Transition of Care [facility]) - CM/SW Discharge Note   Patient Details  Name: Nathan Koch MRN: [id number] Date of Birth: 1990-11-28  [facility] CM/SW Contact:  Otelia Santee, LCSW Phone Number: 12/30/2021, 3:11 PM   Clinical Narrative:    Pt will be transferring to [facility] BMU room 305. Accepting provider is Dr. Toni Amend. RN to call report to [phone].    Final next level of care: Psychiatric Hospital Barriers to Discharge: No Barriers Identified   Patient Goals and CMS Choice        Discharge Placement                       Discharge Plan and Services In-house Referral: Clinical Social Work Discharge Planning Services: CM Consult Post Acute Care Choice: NA          DME Arranged: N/A DME Agency: NA                  Social Determinants of Health (SDOH) Interventions     Readmission Risk Interventions     No data to display

## 2021-12-31 DIAGNOSIS — F332 Major depressive disorder, recurrent severe without psychotic features: Secondary | ICD-10-CM | POA: Diagnosis not present

## 2021-12-31 MED ORDER — ALBUTEROL SULFATE HFA 108 (90 BASE) MCG/ACT IN AERS
2.0000 | INHALATION_SPRAY | Freq: Four times a day (QID) | RESPIRATORY_TRACT | Status: DC | PRN
  Administered 2021-12-31 – 2022-01-06 (×12): 2 via RESPIRATORY_TRACT
  Filled 2021-12-31: qty 6.7

## 2021-12-31 MED ORDER — LAMOTRIGINE 25 MG PO TABS
25.0000 mg | ORAL_TABLET | Freq: Every day | ORAL | Status: DC
  Administered 2021-12-31 – 2022-01-03 (×4): 25 mg via ORAL
  Filled 2021-12-31 (×4): qty 1

## 2021-12-31 NOTE — Progress Notes (Signed)
Patient calm and pleasant during assessment denying HI/AVH. Pt endorses passive SI, verbally contracting for safety. Pt stated he had a good day and was glad to be alive. Pt observed interacting appropriately with staff and peers on the unit. Pt compliant with medication administration per MD orders. Pt given education, support, and encouragement to be active in his treatment plan. Pt being monitored Q 15 minutes for safety per unit protocol, remains safe on the unit.

## 2021-12-31 NOTE — BH IP Treatment Plan (Signed)
Interdisciplinary Treatment and Diagnostic Plan Update  12/31/2021 Time of Session: 9:30AM Nathan Koch MRN: [id number]  Principal Diagnosis: Severe recurrent major depression without psychotic features ([place])  Secondary Diagnoses: Principal Problem:   Severe recurrent major depression without psychotic features ([place])   Current Medications:  Current Facility-Administered Medications  Medication Dose Route Frequency Provider Last Rate Last Admin   acetaminophen (TYLENOL) tablet 650 mg  650 mg Oral Q6H PRN Clapacs, [name], MD       albuterol (VENTOLIN HFA) 108 (90 Base) MCG/ACT inhaler 2 puff  2 puff Inhalation Q6H PRN Clapacs, [name], MD       alum & mag hydroxide-simeth (MAALOX/MYLANTA) 200-200-20 MG/5ML suspension 30 mL  30 mL Oral Q4H PRN Clapacs, [name], MD       hydrocortisone cream 0.5 %   Topical BID Clapacs, [name], MD       hydrOXYzine (ATARAX) tablet 50 mg  50 mg Oral Q6H PRN Clapacs, [name], MD   50 mg at 12/30/21 2157   magnesium hydroxide (MILK OF MAGNESIA) suspension 30 mL  30 mL Oral Daily PRN Clapacs, [name], MD       nicotine (NICODERM CQ - dosed in mg/24 hours) patch 14 mg  14 mg Transdermal Daily Clapacs, [name], MD       QUEtiapine (SEROQUEL) tablet 100 mg  100 mg Oral QHS Clapacs, [name], MD   100 mg at 12/30/21 2157   PTA Medications: Medications Prior to Admission  Medication Sig Dispense Refill Last Dose   albuterol (VENTOLIN HFA) 108 (90 Base) MCG/ACT inhaler Inhale 2 puffs into the lungs every 6 (six) hours as needed for shortness of breath. 6.7 g 0    hydrOXYzine (ATARAX) 50 MG tablet Take 1 tablet (50 mg total) by mouth 3 (three) times daily as needed for anxiety. 20 tablet 0    lamoTRIgine (LAMICTAL) 25 MG tablet Take 1 tablet (25 mg total) by mouth daily. 10 tablet 0    QUEtiapine (SEROQUEL) 100 MG tablet Take 1 tablet (100 mg total) by mouth at bedtime. (Patient not taking: Reported on 12/29/2021) 10 tablet 0    traZODone (DESYREL) 100 MG tablet Take 1  tablet (100 mg total) by mouth at bedtime as needed for sleep. (Patient not taking: Reported on 12/29/2021) 10 tablet 0     Patient Stressors: Health problems   Medication change or noncompliance   Substance abuse    Patient Strengths: Ability for insight  Active sense of humor  Motivation for treatment/growth   Treatment Modalities: Medication Management, Group therapy, Case management,  1 to 1 session with clinician, Psychoeducation, Recreational therapy.   Physician Treatment Plan for Primary Diagnosis: Severe recurrent major depression without psychotic features ([place]) Long Term Goal(s):     Short Term Goals:    Medication Management: Evaluate patient's response, side effects, and tolerance of medication regimen.  Therapeutic Interventions: 1 to 1 sessions, Unit Group sessions and Medication administration.  Evaluation of Outcomes: Not Met  Physician Treatment Plan for Secondary Diagnosis: Principal Problem:   Severe recurrent major depression without psychotic features ([place])  Long Term Goal(s):     Short Term Goals:       Medication Management: Evaluate patient's response, side effects, and tolerance of medication regimen.  Therapeutic Interventions: 1 to 1 sessions, Unit Group sessions and Medication administration.  Evaluation of Outcomes: Not Met   RN Treatment Plan for Primary Diagnosis: Severe recurrent major depression without psychotic features ([place]) Long Term Goal(s):  Knowledge of disease and therapeutic regimen to maintain health will improve  Short Term Goals: Ability to demonstrate self-control, Ability to participate in decision making will improve, Ability to verbalize feelings will improve, Ability to disclose and discuss suicidal ideas, Ability to identify and develop effective coping behaviors will improve, and Compliance with prescribed medications will improve  Medication Management: RN will administer medications as ordered by provider, will assess  and evaluate patient's response and provide education to patient for prescribed medication. RN will report any adverse and/or side effects to prescribing provider.  Therapeutic Interventions: 1 on 1 counseling sessions, Psychoeducation, Medication administration, Evaluate responses to treatment, Monitor vital signs and CBGs as ordered, Perform/monitor CIWA, COWS, AIMS and Fall Risk screenings as ordered, Perform wound care treatments as ordered.  Evaluation of Outcomes: Not Met   LCSW Treatment Plan for Primary Diagnosis: Severe recurrent major depression without psychotic features ([place]) Long Term Goal(s): Safe transition to appropriate next level of care at discharge, Engage patient in therapeutic group addressing interpersonal concerns.  Short Term Goals: Engage patient in aftercare planning with referrals and resources, Increase social support, Increase ability to appropriately verbalize feelings, Increase emotional regulation, Facilitate acceptance of mental health diagnosis and concerns, Facilitate patient progression through stages of change regarding substance use diagnoses and concerns, Identify triggers associated with mental health/substance abuse issues, and Increase skills for wellness and recovery  Therapeutic Interventions: Assess for all discharge needs, 1 to 1 time with Social worker, Explore available resources and support systems, Assess for adequacy in community support network, Educate family and significant other(s) on suicide prevention, Complete Psychosocial Assessment, Interpersonal group therapy.  Evaluation of Outcomes: Not Met   Progress in Treatment: Attending groups: No. Participating in groups: No. Taking medication as prescribed: Yes. Toleration medication: Yes. Family/Significant other contact made: No, will contact:  once permission is given. Patient understands diagnosis: Yes. Discussing patient identified problems/goals with staff: Yes. Medical problems  stabilized or resolved: Yes. Denies suicidal/homicidal ideation: Yes. Issues/concerns per patient self-inventory: No. Other: none  New problem(s) identified: No, Describe:  none  New Short Term/Long Term Goal(s): detox, medication management for mood stabilization; elimination of SI thoughts; development of comprehensive mental wellness/sobriety plan.   Patient Goals:  "I would like to work on myself, get out of my own way and go to rehab".  Discharge Plan or Barriers: CSW to assist patient in development of appropriate discharge plans.   Reason for Continuation of Hospitalization: Anxiety Depression Medication stabilization Suicidal ideation Withdrawal symptoms  Estimated Length of Stay:  1-7 days  Last 3 [place] Suicide Severity Risk Score: [place] Admission (Current) from 12/30/2021 in [facility] ED to Hosp-Admission (Discharged) from 12/29/2021 in [place] Admission (Discharged) from 12/04/2021 in [place] Low Risk High Risk High Risk       Last PHQ 2/9 Scores:    04/15/2021    2:16 AM 02/05/2021    4:55 AM 01/15/2021    8:04 AM  Depression screen PHQ 2/9  Decreased Interest 1 2 1   Down, Depressed, Hopeless 1 2 1   PHQ - 2 Score 2 4 2   Altered sleeping 2 0 0  Tired, decreased energy 2 1 0  Change in appetite 2 0 0  Feeling bad or failure about yourself  2 1 2   Trouble concentrating 2 1 0  Moving slowly or fidgety/restless 2 0   Suicidal thoughts 2 2 2   PHQ-9 Score 16  9 6  Difficult doing work/chores Very difficult Extremely dIfficult     Scribe for Treatment Team: Rozann Lesches, Marlinda Mike 12/31/2021 12:26 PM

## 2021-12-31 NOTE — [facility] Group Notes (Signed)
[facility] Group Notes:  (Nursing/MHT/Case Management/Adjunct)  Date:  12/31/2021  Time:  8:55 PM  Type of Therapy:   Wrap up  Participation Level:  Active  Participation Quality:  Appropriate  Affect:  Appropriate  Cognitive:  Alert  Insight:  Good  Engagement in Group:  Engaged and said he woke up  Modes of Intervention:  Support  Summary of Progress/Problems:  Mayra Neer 12/31/2021, 8:55 PM

## 2021-12-31 NOTE — Progress Notes (Signed)
Patient admitted from [facility] - ED, report received from [place], [place]. Pt calm and cooperative during assessment stating that he relapsed on meth, is homeless and became suicidal. Pt has superficial cuts on bilateral forearms and legs. Wounds are healing and not covered. Pt denies SI/HI/AVH with this Clinical research associate. Pt endorses anxiety and depression. Pt oriented to his room and the unit. Pt given education, support, and encouragement to be active in his treatment plan. Pt compliant with medication administration per MD orders. Pt being monitored Q 15 minutes for safety per unit protocol, remains safe on the unit

## 2021-12-31 NOTE — H&P (Signed)
Psychiatric Admission Assessment Adult  Patient Identification: Nathan Koch MRN:  [id number] Date of Evaluation:  12/31/2021 Chief Complaint:  Severe recurrent major depression without psychotic features ([name]) [F33.2] Principal Diagnosis: Severe recurrent major depression without psychotic features ([name]) Diagnosis:  Principal Problem:   Severe recurrent major depression without psychotic features ([name]) Active Problems:   Asthma   Methamphetamine use disorder, severe ([name])   Self-inflicted laceration of left wrist ([name])  History of Present Illness: Patient seen and chart reviewed.  This is a 31 year old man with a history of depression and substance use problems who comes back to the hospital in [place] this time with active suicidal thoughts and lacerations to his left forearm.  He tells me that he had been staying at the [facility] staying clean for a while but then had a relapse and was out in the streets again using methamphetamine.  Coming down from a binge became hopeless and overwhelmed realizing he had once again lost his living situation.  Cut himself on the forearm and was having active wishes to die but called the police instead.  Currently no intent to act out in the hospital.  No current hallucinations or active psychotic symptoms. Associated Signs/Symptoms: Depression Symptoms:  depressed mood, anhedonia, insomnia, psychomotor retardation, fatigue, feelings of worthlessness/guilt, difficulty concentrating, suicidal attempt, Duration of Depression Symptoms: Less than two weeks  (Hypo) Manic Symptoms:  Distractibility, Anxiety Symptoms:  Excessive Worry, Psychotic Symptoms:   None reported PTSD Symptoms: Negative Total Time spent with patient: 1 hour  Past Psychiatric History: Patient has a past history of depression and suicide attempts.  Tends to get overwhelmed easily under stress and has had multiple attempts to harm himself.  Usually suicidal ideation resolves  once he has some hope again.  Continue current medications as they were when he left the hospital last time.  Engage in individual and group therapy.  Is the patient at risk to self? Yes.    Has the patient been a risk to self in the past 6 months? Yes.    Has the patient been a risk to self within the distant past? Yes.    Is the patient a risk to others? No.  Has the patient been a risk to others in the past 6 months? No.  Has the patient been a risk to others within the distant past? No.   [place] Scale:  Flowsheet Row Admission (Current) from 12/30/2021 in [facility]PAT[facility] ED to Hosp-Admission (Discharged) from 12/29/2021 in [facility] Admission (Discharged) from 12/04/2021 in [facility] INPATIENT BE[facility]IORAL MEDICINE  C-SSRS RISK CATEGORY Low Risk High Risk High Risk        Prior Inpatient Therapy:   Prior Outpatient Therapy:    Alcohol Screening: 1. How often do you have a drink containing alcohol?: Never 2. How many drinks containing alcohol do you have on a typical day when you are drinking?: 3 or 4 3. How often do you have six or more drinks on one occasion?: Less than monthly AUDIT-C Score: 2 4. How often during the last year have you found that you were not able to stop drinking once you had started?: Never 5. How often during the last year have you failed to do what was normally expected from you because of drinking?: Never 6. How often during the last year have you needed a first drink in the morning to get yourself going after a heavy drinking session?: Never 7. How often during  the last year have you had a feeling of guilt of remorse after drinking?: Never 8. How often during the last year have you been unable to remember what happened the night before because you had been drinking?: Never 9. Have you or someone else been injured as a result of your drinking?: No 10. Has a relative or friend or a doctor or another health  worker been concerned about your drinking or suggested you cut down?: No Alcohol Use Disorder Identification Test Final Score (AUDIT): 2 Substance Abuse History in the last 12 months:  Yes.   Consequences of Substance Abuse: Substance use drives a lot of him impulsivity and depression Previous Psychotropic Medications: Yes  Psychological Evaluations: Yes  Past Medical History:  Past Medical History:  Diagnosis Date   Anxiety    Anxiety disorder    Asthma    Bipolar 1 disorder ([name])    Depression    Methamphetamine abuse ([name])    PTSD (post-traumatic stress disorder)     Past Surgical History:  Procedure Laterality Date   CLOSED REDUCTION MANDIBLE N/A 07/02/2018   Procedure: CLOSED REDUCTION MANDIBULAR;  Surgeon: Vernie MurdersJuengel, Paul, MD;  Location: [facility] ORS;  Service: ENT;  Laterality: N/A;   Family History:  Family History  Problem Relation Age of Onset   Asthma Mother    Cancer Mother        breast cancer   Ulcers Mother    Cancer Father        esophogeal cancer   Hypertension Father    Asthma Brother    Family Psychiatric  History: See previous Tobacco Screening:   Social History:  Social History   Substance and Sexual Activity  Alcohol Use Not Currently   Alcohol/week: 13.0 standard drinks of alcohol   Types: 6 Cans of beer, 7 Standard drinks or equivalent per week   Comment: previously heavy drinker 2016. most recent 4 beer/ day drinker and none since 01-11-2020     Social History   Substance and Sexual Activity  Drug Use Yes   Types: Methamphetamines   Comment: states he uses 1 g every other day    Additional Social History:                           Allergies:   Allergies  Allergen Reactions   Latex Hives   Lab Results:  Results for orders placed or performed during the hospital encounter of 12/29/21 (from the past 48 hour(s))  Comprehensive metabolic panel     Status: Abnormal   Collection Time: 12/30/21  4:46 AM  Result Value Ref Range    Sodium 142 135 - 145 mmol/L   Potassium 3.9 3.5 - 5.1 mmol/L   Chloride 115 (H) 98 - 111 mmol/L   CO2 24 22 - 32 mmol/L   Glucose, Bld 140 (H) 70 - 99 mg/dL    Comment: Glucose reference range applies only to samples taken after fasting for at least 8 hours.   BUN 16 6 - 20 mg/dL   Creatinine, Ser 1.610.79 0.61 - 1.24 mg/dL   Calcium 8.2 (L) 8.9 - 10.3 mg/dL    Comment: DELTA CHECK NOTED   Total Protein 5.8 (L) 6.5 - 8.1 g/dL   Albumin 3.0 (L) 3.5 - 5.0 g/dL   AST 69 (H) 15 - 41 U/L   ALT 35 0 - 44 U/L   Alkaline Phosphatase 47 38 - 126 U/L   Total Bilirubin 0.9 0.3 -  1.2 mg/dL   GFR, Estimated >32 >95 mL/min    Comment: (NOTE) Calculated using the CKD-EPI Creatinine Equation (2021)    Anion gap 3 (L) 5 - 15    Comment: Performed at [facility], [address] [place], [place] [address]  CK     Status: Abnormal   Collection Time: 12/30/21  4:46 AM  Result Value Ref Range   Total CK 3,547 (H) 49 - 397 U/L    Comment: Performed at [facility], [address], [place], [place] [address]  CBC     Status: Abnormal   Collection Time: 12/30/21  7:28 AM  Result Value Ref Range   WBC 6.5 4.0 - 10.5 K/uL   RBC 4.34 4.22 - 5.81 MIL/uL   Hemoglobin 12.5 (L) 13.0 - 17.0 g/dL   HCT 01.6 (L) 01.0 - 93.2 %   MCV 88.5 80.0 - 100.0 fL   MCH 28.8 26.0 - 34.0 pg   MCHC 32.6 30.0 - 36.0 g/dL   RDW 35.5 73.2 - 20.2 %   Platelets 197 150 - 400 K/uL   nRBC 0.0 0.0 - 0.2 %    Comment: Performed at [facility], [address], [place], [place] [address]  SARS Coronavirus 2 by RT PCR (hospital order, performed in [facility] hospital lab) *cepheid single result test* Anterior Nasal Swab     Status: None   Collection Time: 12/30/21  2:50 PM   Specimen: Anterior Nasal Swab  Result Value Ref Range   SARS Coronavirus 2 by RT PCR NEGATIVE NEGATIVE    Comment: (NOTE) SARS-CoV-2 target nucleic acids are NOT DETECTED.  The SARS-CoV-2 RNA is  generally detectable in upper and lower respiratory specimens during the acute phase of infection. The lowest concentration of SARS-CoV-2 viral copies this assay can detect is 250 copies / mL. A negative result does not preclude SARS-CoV-2 infection and should not be used as the sole basis for treatment or other patient management decisions.  A negative result may occur with improper specimen collection / handling, submission of specimen other than nasopharyngeal swab, presence of viral mutation(s) within the areas targeted by this assay, and inadequate number of viral copies (<250 copies / mL). A negative result must be combined with clinical observations, patient history, and epidemiological information.  Fact Sheet for Patients:   RoadLapTop.co.za  Fact Sheet for Healthcare Providers: http://kim-miller.com/  This test is not yet approved or  cleared by the [place] FDA and has been authorized for detection and/or diagnosis of SARS-CoV-2 by FDA under an Emergency Use Authorization (EUA).  This EUA will remain in effect (meaning this test can be used) for the duration of the COVID-19 declaration under Section 564(b)(1) of the Act, 21 U.S.C. section 360bbb-3(b)(1), unless the authorization is terminated or revoked sooner.  Performed at [facility], [address] [place], [place] [address]     Blood Alcohol level:  Lab Results  Component Value Date   [facility] <10 12/29/2021   ETH <10 11/30/2021    Metabolic Disorder Labs:  Lab Results  Component Value Date   HGBA1C 6.0 (H) 01/17/2021   MPG 126 01/17/2021   MPG 126 10/08/2020   No results found for: "PROLACTIN" Lab Results  Component Value Date   CHOL 94 01/17/2021   TRIG 100 01/17/2021   HDL 28 (L) 01/17/2021   CHOLHDL 3.4 01/17/2021   VLDL 20 01/17/2021   LDLCALC 46 01/17/2021   LDLCALC 49  10/08/2020    Current Medications: Current  Facility-Administered Medications  Medication Dose Route Frequency Provider Last Rate Last Admin   acetaminophen (TYLENOL) tablet 650 mg  650 mg Oral Q6H PRN Ermel Verne T, MD       albuterol (VENTOLIN HFA) 108 (90 Base) MCG/ACT inhaler 2 puff  2 puff Inhalation Q6H PRN Rhyen Mazariego, Jackquline Denmark, MD   2 puff at 12/31/21 1711   alum & mag hydroxide-simeth (MAALOX/MYLANTA) 200-200-20 MG/5ML suspension 30 mL  30 mL Oral Q4H PRN Zemirah Krasinski, Jackquline Denmark, MD       hydrocortisone cream 0.5 %   Topical BID Feliciano Wynter, Jackquline Denmark, MD   Given at 12/31/21 1712   hydrOXYzine (ATARAX) tablet 50 mg  50 mg Oral Q6H PRN Sindi Beckworth, Jackquline Denmark, MD   50 mg at 12/30/21 2157   lamoTRIgine (LAMICTAL) tablet 25 mg  25 mg Oral Daily Sania Noy T, MD       magnesium hydroxide (MILK OF MAGNESIA) suspension 30 mL  30 mL Oral Daily PRN Linsey Arteaga, Jackquline Denmark, MD       nicotine (NICODERM CQ - dosed in mg/24 hours) patch 14 mg  14 mg Transdermal Daily Horace Lukas T, MD       QUEtiapine (SEROQUEL) tablet 100 mg  100 mg Oral QHS Tanija Germani T, MD   100 mg at 12/30/21 2157   PTA Medications: Medications Prior to Admission  Medication Sig Dispense Refill Last Dose   albuterol (VENTOLIN HFA) 108 (90 Base) MCG/ACT inhaler Inhale 2 puffs into the lungs every 6 (six) hours as needed for shortness of breath. 6.7 g 0    hydrOXYzine (ATARAX) 50 MG tablet Take 1 tablet (50 mg total) by mouth 3 (three) times daily as needed for anxiety. 20 tablet 0    lamoTRIgine (LAMICTAL) 25 MG tablet Take 1 tablet (25 mg total) by mouth daily. 10 tablet 0    QUEtiapine (SEROQUEL) 100 MG tablet Take 1 tablet (100 mg total) by mouth at bedtime. (Patient not taking: Reported on 12/29/2021) 10 tablet 0    traZODone (DESYREL) 100 MG tablet Take 1 tablet (100 mg total) by mouth at bedtime as needed for sleep. (Patient not taking: Reported on 12/29/2021) 10 tablet 0     Musculoskeletal: Strength & Muscle Tone: within normal limits Gait & Station: normal Patient leans:  N/A            Psychiatric Specialty Exam:  Presentation  General Appearance: Appropriate for Environment  Eye Contact:Good  Speech:Clear and Coherent  Speech Volume:Normal  Handedness:Right   Mood and Affect  Mood:Euthymic  Affect:Congruent   Thought Process  Thought Processes:Coherent  Duration of Psychotic Symptoms: N/A  Past Diagnosis of Schizophrenia or Psychoactive disorder: No  Descriptions of Associations:Intact  Orientation:Full (Time, Place and Person)  Thought Content:Logical  Hallucinations:No data recorded Ideas of Reference:None  Suicidal Thoughts:No data recorded Homicidal Thoughts:No data recorded  Sensorium  Memory:Immediate Good; Recent Good  Judgment:Fair  Insight:Fair   Executive Functions  Concentration:Fair  Attention Span:Good  Recall:Good  Fund of Knowledge:Fair  Language:Fair   Psychomotor Activity  Psychomotor Activity:No data recorded  Assets  Assets:Desire for Improvement; Resilience; Physical Health   Sleep  Sleep:No data recorded   Physical Exam: Physical Exam Vitals and nursing note reviewed.  Constitutional:      Appearance: Normal appearance.  HENT:     Head: Normocephalic and atraumatic.     Mouth/Throat:     Pharynx: Oropharynx is clear.  Eyes:     Pupils:  Pupils are equal, round, and reactive to light.  Cardiovascular:     Rate and Rhythm: Normal rate and regular rhythm.  Pulmonary:     Effort: Pulmonary effort is normal.     Breath sounds: Normal breath sounds.  Abdominal:     General: Abdomen is flat.     Palpations: Abdomen is soft.  Musculoskeletal:        General: Normal range of motion.  Skin:    General: Skin is warm and dry.  Neurological:     General: No focal deficit present.     Mental Status: He is alert. Mental status is at baseline.  Psychiatric:        Attention and Perception: Attention normal.        Mood and Affect: Mood is depressed. Affect is blunt.         Speech: Speech is delayed.        Behavior: Behavior is slowed.        Thought Content: Thought content includes suicidal ideation.        Cognition and Memory: Cognition normal.        Judgment: Judgment is impulsive.    Review of Systems  Constitutional: Negative.   HENT: Negative.    Eyes: Negative.   Respiratory: Negative.    Cardiovascular: Negative.   Gastrointestinal: Negative.   Musculoskeletal: Negative.   Skin: Negative.   Neurological: Negative.   Psychiatric/Behavioral:  Positive for depression, substance abuse and suicidal ideas. The patient is nervous/anxious.    Blood pressure 122/69, pulse (!) 51, temperature 97.8 F (36.6 C), temperature source Oral, resp. rate 15, height 5\' 8"  (1.727 m), weight 108.9 kg, SpO2 100 %. Body mass index is 36.49 kg/m.  Treatment Plan Summary: Plan continue current medication.  Engage in individual and group therapy.  Work with full treatment team on discharge options and places to live.  Ongoing assessment of dangerousness prior to discharge.  Observation Level/Precautions:  15 minute checks  Laboratory:  Chemistry Profile  Psychotherapy:    Medications:    Consultations:    Discharge Concerns:    Estimated LOS:  Other:     Physician Treatment Plan for Primary Diagnosis: Severe recurrent major depression without psychotic features ([name]) Long Term Goal(s): Improvement in symptoms so as ready for discharge  Short Term Goals: Ability to verbalize feelings will improve, Ability to disclose and discuss suicidal ideas, and Ability to demonstrate self-control will improve  Physician Treatment Plan for Secondary Diagnosis: Principal Problem:   Severe recurrent major depression without psychotic features ([name]) Active Problems:   Asthma   Methamphetamine use disorder, severe ([name])   Self-inflicted laceration of left wrist ([name])  Long Term Goal(s): Improvement in symptoms so as ready for discharge  Short Term Goals: Compliance  with prescribed medications will improve and Ability to identify triggers associated with substance abuse/mental health issues will improve  I certify that inpatient services furnished can reasonably be expected to improve the patient's condition.    , MD 9/6/20235:32 PM

## 2021-12-31 NOTE — Progress Notes (Signed)
Recreation Therapy Notes    Date: 12/31/2021   Time: 10:20 am    Location: Court yard     Behavioral response: N/A   Intervention Topic: Decision Making    Discussion/Intervention: Patient refused to attend group.    Clinical Observations/Feedback:  Patient refused to attend group.    Getsemani Lindon LRT/CTRS        Min Collymore 12/31/2021 10:37 AM 

## 2021-12-31 NOTE — Group Note (Signed)
[facility] LCSW Group Therapy Note   Group Date: 12/31/2021 Start Time: 1300 End Time: 1400   Type of Therapy/Topic:  Group Therapy:  Emotion Regulation  Participation Level:  Did Not Attend    Description of Group:    The purpose of this group is to assist patients in learning to regulate negative emotions and experience positive emotions. Patients will be guided to discuss ways in which they have been vulnerable to their negative emotions. These vulnerabilities will be juxtaposed with experiences of positive emotions or situations, and patients challenged to use positive emotions to combat negative ones. Special emphasis will be placed on coping with negative emotions in conflict situations, and patients will process healthy conflict resolution skills.  Therapeutic Goals: Patient will identify two positive emotions or experiences to reflect on in order to balance out negative emotions:  Patient will label two or more emotions that they find the most difficult to experience:  Patient will be able to demonstrate positive conflict resolution skills through discussion or role plays:   Summary of Patient Progress: Group not held due to acuity.     Therapeutic Modalities:   Cognitive Behavioral Therapy Feelings Identification Dialectical Behavioral Therapy   Zinedine Ellner R Josph [place], LCSW 

## 2021-12-31 NOTE — Plan of Care (Signed)
D- Patient alert and oriented. Patient presented in a pleasant mood on assessment stating that he didn't sleep well and that he is still exhausted. Patient reports that his last bowel movement was four days ago, however, he stated that he didn't need a laxative because "I feel like I'm about to go". Patient endorsed passive SI, without a plan, however, he contracts for safety with this Clinical research associate. Patient also endorsed both depression and anxiety, stating that "I'm always gonna have anxiety". With regards to his depression, patient stated he feels this way because he "relapsed again, I can't stay clean. I've lost everything I've worked for, again, and I'm beating myself up about it". Patient denied HI/AVH, and pain at this time. Patient had no stated goals for today.  A- Support and encouragement provided. Routine safety checks conducted every 15 minutes. Patient informed to notify staff with problems or concerns.  R- Patient contracts for safety at this time. Patient compliant with treatment plan. Patient receptive, calm, and cooperative. Patient interacts well with others on the unit. Patient remains safe at this time.  Problem: Education: Goal: Knowledge of General Education information will improve Description: Including pain rating scale, medication(s)/side effects and non-pharmacologic comfort measures Outcome: Not Progressing   Problem: Health Behavior/Discharge Planning: Goal: Ability to manage health-related needs will improve Outcome: Not Progressing   Problem: Clinical Measurements: Goal: Ability to maintain clinical measurements within normal limits will improve Outcome: Not Progressing Goal: Will remain free from infection Outcome: Not Progressing Goal: Diagnostic test results will improve Outcome: Not Progressing Goal: Respiratory complications will improve Outcome: Not Progressing Goal: Cardiovascular complication will be avoided Outcome: Not Progressing   Problem:  Activity: Goal: Risk for activity intolerance will decrease Outcome: Not Progressing   Problem: Nutrition: Goal: Adequate nutrition will be maintained Outcome: Not Progressing   Problem: Coping: Goal: Level of anxiety will decrease Outcome: Not Progressing   Problem: Elimination: Goal: Will not experience complications related to bowel motility Outcome: Not Progressing Goal: Will not experience complications related to urinary retention Outcome: Not Progressing   Problem: Pain Managment: Goal: General experience of comfort will improve Outcome: Not Progressing   Problem: Safety: Goal: Ability to remain free from injury will improve Outcome: Not Progressing   Problem: Skin Integrity: Goal: Risk for impaired skin integrity will decrease Outcome: Not Progressing

## 2021-12-31 NOTE — Progress Notes (Signed)
Patient refused his scheduled morning medication, which was a nicotine patch and hydrocortisone cream, stating that he is alright right now.

## 2021-12-31 NOTE — [facility] Suicide Risk Assessment (Signed)
[facility] INPATIENT:  Family/Significant Other Suicide Prevention Education  Suicide Prevention Education:  Patient Refusal for Family/Significant Other Suicide Prevention Education: The patient Nathan Koch has refused to provide written consent for family/significant other to be provided Family/Significant Other Suicide Prevention Education during admission and/or prior to discharge.  Physician notified.  SPE completed with pt, as pt refused to consent to family contact. SPI pamphlet provided to pt and pt was encouraged to share information with support network, ask questions, and talk about any concerns relating to SPE. Pt denies access to guns/firearms and verbalized understanding of information provided. Mobile Crisis information also provided to pt.  Glenis Smoker 12/31/2021, 3:55 PM

## 2021-12-31 NOTE — Plan of Care (Signed)
Patient new to the unit tonight, hasn't had time to progess  Problem: Education: Goal: Knowledge of General Education information will improve Description: Including pain rating scale, medication(s)/side effects and non-pharmacologic comfort measures Outcome: Not Progressing   Problem: Health Behavior/Discharge Planning: Goal: Ability to manage health-related needs will improve Outcome: Not Progressing   Problem: Clinical Measurements: Goal: Ability to maintain clinical measurements within normal limits will improve Outcome: Not Progressing Goal: Will remain free from infection Outcome: Not Progressing Goal: Diagnostic test results will improve Outcome: Not Progressing Goal: Respiratory complications will improve Outcome: Not Progressing Goal: Cardiovascular complication will be avoided Outcome: Not Progressing   Problem: Activity: Goal: Risk for activity intolerance will decrease Outcome: Not Progressing   Problem: Nutrition: Goal: Adequate nutrition will be maintained Outcome: Not Progressing   Problem: Coping: Goal: Level of anxiety will decrease Outcome: Not Progressing   Problem: Elimination: Goal: Will not experience complications related to bowel motility Outcome: Not Progressing Goal: Will not experience complications related to urinary retention Outcome: Not Progressing   Problem: Pain Managment: Goal: General experience of comfort will improve Outcome: Not Progressing   Problem: Safety: Goal: Ability to remain free from injury will improve Outcome: Not Progressing   Problem: Skin Integrity: Goal: Risk for impaired skin integrity will decrease Outcome: Not Progressing   

## 2021-12-31 NOTE — BHH Suicide Risk Assessment (Signed)
[facility] Admission Suicide Risk Assessment   Nursing information obtained from:  Patient Demographic factors:  Male, Low socioeconomic status Current Mental Status:  Suicidal ideation indicated by patient Loss Factors:  Decline in physical health Historical Factors:  Impulsivity Risk Reduction Factors:  Positive coping skills or problem solving skills  Total Time spent with patient: 45 minutes Principal Problem: Severe recurrent major depression without psychotic features ([name]) Diagnosis:  Principal Problem:   Severe recurrent major depression without psychotic features ([name]) Active Problems:   Asthma   Methamphetamine use disorder, severe ([name])   Self-inflicted laceration of left wrist ([name])  Subjective Data: "I had a really bad mood relapse this time".  Patient presents back to the hospital with suicidal ideation and lacerations to the left forearm.  Relapsed into methamphetamine use losing his place to live again.  Currently still sad and negative but not actively planning to harm himself in the hospital.  Open to appropriate planning for the future  Continued Clinical Symptoms:  Alcohol Use Disorder Identification Test Final Score (AUDIT): 2 The "Alcohol Use Disorders Identification Test", Guidelines for Use in Primary Care, Second Edition.  World Science writer [facility]). Score between 0-7:  no or low risk or alcohol related problems. Score between 8-15:  moderate risk of alcohol related problems. Score between 16-19:  high risk of alcohol related problems. Score 20 or above:  warrants further diagnostic evaluation for alcohol dependence and treatment.   CLINICAL FACTORS:   Depression:   Comorbid alcohol abuse/dependence Alcohol/Substance Abuse/Dependencies   Musculoskeletal: Strength & Muscle Tone: within normal limits Gait & Station: normal Patient leans: N/A  Psychiatric Specialty Exam:  Presentation  General Appearance: Appropriate for Environment  Eye  Contact:Good  Speech:Clear and Coherent  Speech Volume:Normal  Handedness:Right   Mood and Affect  Mood:Euthymic  Affect:Congruent   Thought Process  Thought Processes:Coherent  Descriptions of Associations:Intact  Orientation:Full (Time, Place and Person)  Thought Content:Logical  History of Schizophrenia/Schizoaffective disorder:No  Duration of Psychotic Symptoms:N/A  Hallucinations:No data recorded Ideas of Reference:None  Suicidal Thoughts:No data recorded Homicidal Thoughts:No data recorded  Sensorium  Memory:Immediate Good; Recent Good  Judgment:Fair  Insight:Fair   Executive Functions  Concentration:Fair  Attention Span:Good  Recall:Good  Fund of Knowledge:Fair  Language:Fair   Psychomotor Activity  Psychomotor Activity:No data recorded  Assets  Assets:Desire for Improvement; Resilience; Physical Health   Sleep  Sleep:No data recorded   Physical Exam: Physical Exam Vitals and nursing note reviewed.  Constitutional:      Appearance: Normal appearance.  HENT:     Head: Normocephalic and atraumatic.     Mouth/Throat:     Pharynx: Oropharynx is clear.  Eyes:     Pupils: Pupils are equal, round, and reactive to light.  Cardiovascular:     Rate and Rhythm: Normal rate and regular rhythm.  Pulmonary:     Effort: Pulmonary effort is normal.     Breath sounds: Normal breath sounds.  Abdominal:     General: Abdomen is flat.     Palpations: Abdomen is soft.  Musculoskeletal:        General: Normal range of motion.  Skin:    General: Skin is warm and dry.  Neurological:     General: No focal deficit present.     Mental Status: He is alert. Mental status is at baseline.  Psychiatric:        Attention and Perception: Attention normal.        Mood and Affect: Mood is depressed.  Speech: Speech is delayed.        Behavior: Behavior is slowed.        Thought Content: Thought content includes suicidal ideation.         Cognition and Memory: Cognition normal.        Judgment: Judgment is impulsive.    Review of Systems  Constitutional: Negative.   HENT: Negative.    Eyes: Negative.   Respiratory: Negative.    Cardiovascular: Negative.   Gastrointestinal: Negative.   Musculoskeletal: Negative.   Skin: Negative.   Neurological: Negative.   Psychiatric/Behavioral:  Positive for depression, substance abuse and suicidal ideas.    Blood pressure 122/69, pulse (!) 51, temperature 97.8 F (36.6 C), temperature source Oral, resp. rate 15, height 5\' 8"  (1.727 m), weight 108.9 kg, SpO2 100 %. Body mass index is 36.49 kg/m.   COGNITIVE FEATURES THAT CONTRIBUTE TO RISK:  Loss of executive function    SUICIDE RISK:   Mild:  Suicidal ideation of limited frequency, intensity, duration, and specificity.  There are no identifiable plans, no associated intent, mild dysphoria and related symptoms, good self-control (both objective and subjective assessment), few other risk factors, and identifiable protective factors, including available and accessible social support.  PLAN OF CARE: Continue 15-minute checks.  Restart medications used in the past for treating depression and anxiety.  Work with full treatment team on possible options for rehab placement or other living situations going forward.  Daily reassessment of dangerousness prior to discharge  I certify that inpatient services furnished can reasonably be expected to improve the patient's condition.   , MD 12/31/2021, 5:30 PM

## 2022-01-01 DIAGNOSIS — F332 Major depressive disorder, recurrent severe without psychotic features: Secondary | ICD-10-CM | POA: Diagnosis not present

## 2022-01-01 NOTE — Plan of Care (Signed)
D- Patient alert and oriented. Patient presented in a pleasant mood on assessment stating that he slept "pretty good" last night and had complaints of a headache, in which he did request PRN medication from this Clinical research associate. Patient continues to endorse passive SI, without a plan. Patient also endorses both depression and anxiety, stating it's the same things from yesterday and that he will always have anxiety. Patient reported yesterday that he "relapsed again, I can't stay clean. I've lost everything I've worked for, again, and I'm beating myself up about it". Patient denied HI/AVH, at this time. Patient had no stated goals for today.  A- Scheduled medications administered to patient, per MD orders. Support and encouragement provided.  Routine safety checks conducted every 15 minutes.  Patient informed to notify staff with problems or concerns.  R- No adverse drug reactions noted. Patient contracts for safety at this time. Patient compliant with medications and treatment plan. Patient receptive, calm, and cooperative. Patient interacts well with others on the unit.  Patient remains safe at this time.  Problem: Education: Goal: Knowledge of General Education information will improve Description: Including pain rating scale, medication(s)/side effects and non-pharmacologic comfort measures Outcome: Not Progressing   Problem: Health Behavior/Discharge Planning: Goal: Ability to manage health-related needs will improve Outcome: Not Progressing   Problem: Clinical Measurements: Goal: Ability to maintain clinical measurements within normal limits will improve Outcome: Not Progressing Goal: Will remain free from infection Outcome: Not Progressing Goal: Diagnostic test results will improve Outcome: Not Progressing Goal: Respiratory complications will improve Outcome: Not Progressing Goal: Cardiovascular complication will be avoided Outcome: Not Progressing   Problem: Activity: Goal: Risk for  activity intolerance will decrease Outcome: Not Progressing   Problem: Nutrition: Goal: Adequate nutrition will be maintained Outcome: Not Progressing   Problem: Coping: Goal: Level of anxiety will decrease Outcome: Not Progressing   Problem: Elimination: Goal: Will not experience complications related to bowel motility Outcome: Not Progressing Goal: Will not experience complications related to urinary retention Outcome: Not Progressing   Problem: Pain Managment: Goal: General experience of comfort will improve Outcome: Not Progressing   Problem: Safety: Goal: Ability to remain free from injury will improve Outcome: Not Progressing   Problem: Skin Integrity: Goal: Risk for impaired skin integrity will decrease Outcome: Not Progressing

## 2022-01-01 NOTE — BHH Counselor (Signed)
CSW met with pt briefly, per request after group. He states that he is interested in pursuing placement through [facility]. CSW to follow up on this. No other concerns expressed. Contact ended without incident.   Chalmers Guest. Guerry Bruin, MSW, LCSW, [place] 01/01/2022 4:36 PM

## 2022-01-01 NOTE — Progress Notes (Signed)
Recreation Therapy Notes   Date: 01/01/2022  Time: 10:05 am   Location: Courtyard      Behavioral response: N/A   Intervention Topic: Leisure    Discussion/Intervention: Patient did not attend group.   Clinical Observations/Feedback:  Patient did not attend group.   Areal Cochrane LRT/CTRS        Aliannah Holstrom 01/01/2022 12:29 PM

## 2022-01-01 NOTE — Progress Notes (Signed)
Patient is pleasant and cooperative.  Med compliant and gets along well with his peers.  Presents well but endorses passive si without a plan. Also endorsees stress and hopelessness as he attempts to find rehab placement. He denies hi and avh but endorses depression and anxiety.  Support and encouragement given.  Will continue to monitor with q15 minute safety checks.    C Butler-Nicholson, LPN

## 2022-01-01 NOTE — Progress Notes (Signed)
[facility] MD Progress Note  01/01/2022 12:50 PM Nathan Koch  MRN:  [id number] Subjective: Patient seen for follow-up.  31 year old man with recurrent depression and substance use problems.  Still feeling depressed down and somewhat hopeless but not having thoughts about acting on suicidal feelings.  Stays isolated much of the time but is expressing appropriate plans for the future and has been cooperative with treatment Principal Problem: Severe recurrent major depression without psychotic features ([name]) Diagnosis: Principal Problem:   Severe recurrent major depression without psychotic features ([name]) Active Problems:   Asthma   Methamphetamine use disorder, severe ([name])   Self-inflicted laceration of left wrist ([name])  Total Time spent with patient: 30 minutes  Past Psychiatric History: Past history of recurrent depression related to substance use  Past Medical History:  Past Medical History:  Diagnosis Date   Anxiety    Anxiety disorder    Asthma    Bipolar 1 disorder ([name])    Depression    Methamphetamine abuse ([name])    PTSD (post-traumatic stress disorder)     Past Surgical History:  Procedure Laterality Date   CLOSED REDUCTION MANDIBLE N/A 07/02/2018   Procedure: CLOSED REDUCTION MANDIBULAR;  Surgeon: Vernie Murders, MD;  Location: [facility] ORS;  Service: ENT;  Laterality: N/A;   Family History:  Family History  Problem Relation Age of Onset   Asthma Mother    Cancer Mother        breast cancer   Ulcers Mother    Cancer Father        esophogeal cancer   Hypertension Father    Asthma Brother    Family Psychiatric  History: See previous Social History:  Social History   Substance and Sexual Activity  Alcohol Use Not Currently   Alcohol/week: 13.0 standard drinks of alcohol   Types: 6 Cans of beer, 7 Standard drinks or equivalent per week   Comment: previously heavy drinker 2016. most recent 4 beer/ day drinker and none since 01-11-2020     Social History   Substance  and Sexual Activity  Drug Use Yes   Types: Methamphetamines   Comment: states he uses 1 g every other day    Social History   Socioeconomic History   Marital status: Single    Spouse name: Not on file   Number of children: 1   Years of education: 10   Highest education level: 10th grade  Occupational History   Not on file  Tobacco Use   Smoking status: Every Day    Packs/day: 0.50    Years: 8.00    Total pack years: 4.00    Types: Cigarettes   Smokeless tobacco: Never  Vaping Use   Vaping Use: Every day   Substances: Nicotine, Flavoring  Substance and Sexual Activity   Alcohol use: Not Currently    Alcohol/week: 13.0 standard drinks of alcohol    Types: 6 Cans of beer, 7 Standard drinks or equivalent per week    Comment: previously heavy drinker 2016. most recent 4 beer/ day drinker and none since 01-11-2020   Drug use: Yes    Types: Methamphetamines    Comment: states he uses 1 g every other day   Sexual activity: Not on file  Other Topics Concern   Not on file  Social History Narrative   Not on file   Social Determinants of Health   Financial Resource Strain: Not on file  Food Insecurity: Food Insecurity Present (12/30/2021)   Hunger Vital Sign  Worried About Programme researcher, broadcasting/film/video in the Last Year: Sometimes true    Ran Out of Food in the Last Year: Sometimes true  Transportation Needs: Unmet Transportation Needs (12/30/2021)   PRAPARE - Administrator, Civil Service (Medical): Yes    Lack of Transportation (Non-Medical): Yes  Physical Activity: Not on file  Stress: Not on file  Social Connections: Not on file   Additional Social History:                         Sleep: Fair  Appetite:  Fair  Current Medications: Current Facility-Administered Medications  Medication Dose Route Frequency Provider Last Rate Last Admin   acetaminophen (TYLENOL) tablet 650 mg  650 mg Oral Q6H PRN Leighann Amadon T, MD   650 mg at 01/01/22 0825   albuterol  (VENTOLIN HFA) 108 (90 Base) MCG/ACT inhaler 2 puff  2 puff Inhalation Q6H PRN Nehemias Sauceda T, MD   2 puff at 12/31/21 1711   alum & mag hydroxide-simeth (MAALOX/MYLANTA) 200-200-20 MG/5ML suspension 30 mL  30 mL Oral Q4H PRN Myrical Andujo, Jackquline Denmark, MD       hydrocortisone cream 0.5 %   Topical BID Babara Buffalo, Jackquline Denmark, MD   Given at 12/31/21 1712   hydrOXYzine (ATARAX) tablet 50 mg  50 mg Oral Q6H PRN Juvenal Umar T, MD   50 mg at 12/31/21 2114   lamoTRIgine (LAMICTAL) tablet 25 mg  25 mg Oral Daily Talina Pleitez T, MD   25 mg at 01/01/22 0825   magnesium hydroxide (MILK OF MAGNESIA) suspension 30 mL  30 mL Oral Daily PRN Annalynne Ibanez, Jackquline Denmark, MD       nicotine (NICODERM CQ - dosed in mg/24 hours) patch 14 mg  14 mg Transdermal Daily Prince Couey T, MD       QUEtiapine (SEROQUEL) tablet 100 mg  100 mg Oral QHS Chaynce Schafer, Jackquline Denmark, MD   100 mg at 12/31/21 2114    Lab Results:  Results for orders placed or performed during the hospital encounter of 12/29/21 (from the past 48 hour(s))  SARS Coronavirus 2 by RT PCR (hospital order, performed in [name] hospital lab) *cepheid single result test* Anterior Nasal Swab     Status: None   Collection Time: 12/30/21  2:50 PM   Specimen: Anterior Nasal Swab  Result Value Ref Range   SARS Coronavirus 2 by RT PCR NEGATIVE NEGATIVE    Comment: (NOTE) SARS-CoV-2 target nucleic acids are NOT DETECTED.  The SARS-CoV-2 RNA is generally detectable in upper and lower respiratory specimens during the acute phase of infection. The lowest concentration of SARS-CoV-2 viral copies this assay can detect is 250 copies / mL. A negative result does not preclude SARS-CoV-2 infection and should not be used as the sole basis for treatment or other patient management decisions.  A negative result may occur with improper specimen collection / handling, submission of specimen other than nasopharyngeal swab, presence of viral mutation(s) within the areas targeted by this assay, and  inadequate number of viral copies (<250 copies / mL). A negative result must be combined with clinical observations, patient history, and epidemiological information.  Fact Sheet for Patients:   RoadLapTop.co.za  Fact Sheet for Healthcare Providers: http://kim-miller.com/  This test is not yet approved or  cleared by the [place] FDA and has been authorized for detection and/or diagnosis of SARS-CoV-2 by FDA under an Emergency Use Authorization (EUA).  This EUA will remain  in effect (meaning this test can be used) for the duration of the COVID-19 declaration under Section 564(b)(1) of the Act, 21 U.S.C. section 360bbb-3(b)(1), unless the authorization is terminated or revoked sooner.  Performed at [facility], [address], [place], [place] [address]     Blood Alcohol level:  Lab Results  Component Value Date   ETH <10 12/29/2021   ETH <10 11/30/2021    Metabolic Disorder Labs: Lab Results  Component Value Date   HGBA1C 6.0 (H) 01/17/2021   MPG 126 01/17/2021   MPG 126 10/08/2020   No results found for: "PROLACTIN" Lab Results  Component Value Date   CHOL 94 01/17/2021   TRIG 100 01/17/2021   HDL 28 (L) 01/17/2021   CHOLHDL 3.4 01/17/2021   VLDL 20 01/17/2021   LDLCALC 46 01/17/2021   LDLCALC 49 10/08/2020    Physical Findings: AIMS:  , ,  ,  ,    CIWA:    COWS:     Musculoskeletal: Strength & Muscle Tone: within normal limits Gait & Station: normal Patient leans: N/A  Psychiatric Specialty Exam:  Presentation  General Appearance: Appropriate for Environment  Eye Contact:Good  Speech:Clear and Coherent  Speech Volume:Normal  Handedness:Right   Mood and Affect  Mood:Euthymic  Affect:Congruent   Thought Process  Thought Processes:Coherent  Descriptions of Associations:Intact  Orientation:Full (Time, Place and Person)  Thought Content:Logical  History of  Schizophrenia/Schizoaffective disorder:No  Duration of Psychotic Symptoms:N/A  Hallucinations:No data recorded Ideas of Reference:None  Suicidal Thoughts:No data recorded Homicidal Thoughts:No data recorded  Sensorium  Memory:Immediate Good; Recent Good  Judgment:Fair  Insight:Fair   Executive Functions  Concentration:Fair  Attention Span:Good  Recall:Good  Fund of Knowledge:Fair  Language:Fair   Psychomotor Activity  Psychomotor Activity:No data recorded  Assets  Assets:Desire for Improvement; Resilience; Physical Health   Sleep  Sleep:No data recorded   Physical Exam: Physical Exam Vitals and nursing note reviewed.  Constitutional:      Appearance: Normal appearance.  HENT:     Head: Normocephalic and atraumatic.     Mouth/Throat:     Pharynx: Oropharynx is clear.  Eyes:     Pupils: Pupils are equal, round, and reactive to light.  Cardiovascular:     Rate and Rhythm: Normal rate and regular rhythm.  Pulmonary:     Effort: Pulmonary effort is normal.     Breath sounds: Normal breath sounds.  Abdominal:     General: Abdomen is flat.     Palpations: Abdomen is soft.  Musculoskeletal:        General: Normal range of motion.  Skin:    General: Skin is warm and dry.  Neurological:     General: No focal deficit present.     Mental Status: He is alert. Mental status is at baseline.  Psychiatric:        Mood and Affect: Mood normal.        Thought Content: Thought content normal.    Review of Systems  Constitutional: Negative.   HENT: Negative.    Eyes: Negative.   Respiratory: Negative.    Cardiovascular: Negative.   Gastrointestinal: Negative.   Musculoskeletal: Negative.   Skin: Negative.   Neurological: Negative.   Psychiatric/Behavioral:  Positive for depression. Negative for suicidal ideas.    Blood pressure 97/66, pulse 66, temperature 97.9 F (36.6 C), temperature source Oral, resp. rate 18, height 5\' 8"  (1.727 m), weight 108.9 kg,  SpO2 99 %. Body mass index is 36.49 kg/m.  Treatment Plan Summary: Medication management and Plan no change to medication management.  Supportive therapy and review of plan.  Psychoeducation.  Encourage patient to talk with social work about rehab options.  No change to medicine for today.  Mordecai Rasmussen, MD 01/01/2022, 12:50 PM

## 2022-01-01 NOTE — Plan of Care (Signed)
  Problem: Education: Goal: Knowledge of General Education information will improve Description: Including pain rating scale, medication(s)/side effects and non-pharmacologic comfort measures Outcome: Progressing   Problem: Health Behavior/Discharge Planning: Goal: Ability to manage health-related needs will improve Outcome: Progressing   Problem: Clinical Measurements: Goal: Ability to maintain clinical measurements within normal limits will improve Outcome: Progressing Goal: Will remain free from infection Outcome: Progressing Goal: Diagnostic test results will improve Outcome: Progressing Goal: Respiratory complications will improve Outcome: Progressing Goal: Cardiovascular complication will be avoided Outcome: Progressing   Problem: Skin Integrity: Goal: Risk for impaired skin integrity will decrease Outcome: Progressing   Problem: Safety: Goal: Ability to remain free from injury will improve Outcome: Progressing   Problem: Pain Managment: Goal: General experience of comfort will improve Outcome: Progressing   Problem: Elimination: Goal: Will not experience complications related to bowel motility Outcome: Progressing Goal: Will not experience complications related to urinary retention Outcome: Progressing   

## 2022-01-01 NOTE — Group Note (Signed)
[facility] LCSW Group Therapy Note   Group Date: 01/01/2022 Start Time: 1300 End Time: 1400   Type of Therapy/Topic:  Group Therapy:  Balance in Life  Participation Level:  Minimal   Description of Group:    This group will address the concept of balance and how it feels and looks when one is unbalanced. Patients will be encouraged to process areas in their lives that are out of balance, and identify reasons for remaining unbalanced. Facilitators will guide patients utilizing problem- solving interventions to address and correct the stressor making their life unbalanced. Understanding and applying boundaries will be explored and addressed for obtaining  and maintaining a balanced life. Patients will be encouraged to explore ways to assertively make their unbalanced needs known to significant others in their lives, using other group members and facilitator for support and feedback.  Therapeutic Goals: Patient will identify two or more emotions or situations they have that consume much of in their lives. Patient will identify signs/triggers that life has become out of balance:  Patient will identify two ways to set boundaries in order to achieve balance in their lives:  Patient will demonstrate ability to communicate their needs through discussion and/or role plays  Summary of Patient Progress: Patient was present for the entirety of the group process. He shares that certain situations cause him to become off balance. Pt was not actively involved in the conversation after this but did appear to attend to the conversation. He appeared open and receptive to feedback/comments from both peers and facilitator.    Therapeutic Modalities:   Cognitive Behavioral Therapy Solution-Focused Therapy Assertiveness Training   Glenis Smoker, LCSW

## 2022-01-02 DIAGNOSIS — F332 Major depressive disorder, recurrent severe without psychotic features: Secondary | ICD-10-CM | POA: Diagnosis not present

## 2022-01-02 NOTE — BHH Counselor (Signed)
CSW met with pt briefly per request after group. Pt submitted application for [place]. No other concerns expressed. Contact ended without incident.   CSW faxed application to [phone].   Chalmers Guest. Guerry Bruin, MSW, [place], [place] 01/02/2022 3:07 PM

## 2022-01-02 NOTE — Progress Notes (Addendum)
Recreation Therapy Notes  Date: 01/02/2022  Time: 10:55am   Location: Craft room   Behavioral response: Appropriate    Intervention Topic: Self-care     Discussion/Intervention:  Group content today was focused on Self-Care. The group defined self-care and some positive ways they care for themselves. Individuals expressed ways and reasons why they neglected any self-care in the past. Patients described ways to improve self-care in the future. The group explained what could happen if they did not do any self-care activities at all. The group participated in the intervention "self-care assessment" where they had a chance to discover some of their weaknesses and strengths in self- care. Patient came up with a self-care plan to improve themselves in the future.   Clinical Observations/Feedback:  Patient came to group and identified swimming at the [facility] as self-care he participates in. Individual was social with peers and staff during intervention.  Laraina Sulton LRT/CTRS         Latresha Yahr 01/02/2022 1:15 PM

## 2022-01-02 NOTE — Group Note (Signed)
[facility] LCSW Group Therapy Note   Group Date: 01/02/2022 Start Time: 1300 End Time: 1400  Type of Therapy and Topic:  Group Therapy:  Feelings around Relapse and Recovery  Participation Level:  Minimal    Description of Group:    Patients in this group will discuss emotions they experience before and after a relapse. They will process how experiencing these feelings, or avoidance of experiencing them, relates to having a relapse. Facilitator will guide patients to explore emotions they have related to recovery. Patients will be encouraged to process which emotions are more powerful. They will be guided to discuss the emotional reaction significant others in their lives may have to patients' relapse or recovery. Patients will be assisted in exploring ways to respond to the emotions of others without this contributing to a relapse.  Therapeutic Goals: Patient will identify two or more emotions that lead to relapse for them:  Patient will identify two emotions that result when they relapse:  Patient will identify two emotions related to recovery:  Patient will demonstrate ability to communicate their needs through discussion and/or role plays.   Summary of Patient Progress: Patient was present for the entirety of the group process. He participated in the icebreaker activity but did not contribute to the conversation further. During discussion, pt was busy filling out an application for placement.    Therapeutic Modalities:   Cognitive Behavioral Therapy Solution-Focused Therapy Assertiveness Training Relapse Prevention Therapy   Glenis Smoker, LCSW
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
## 2022-01-02 NOTE — Plan of Care (Signed)
Pt endorses experiencing anxiety/depression. Pt also endorses SI w/o a plan while here and verbally contracts for safety while here. Pt denies experiencing HI/AVH at this time. Pt report having abdominal pain related to eating too much. PRN medication was offered for the abdominal pain and refused.   Problem: Activity: Goal: Risk for activity intolerance will decrease Outcome: Progressing   Problem: Coping: Goal: Level of anxiety will decrease Outcome: Not Progressing

## 2022-01-02 NOTE — Progress Notes (Signed)
Recreation Therapy Notes  INPATIENT RECREATION THERAPY ASSESSMENT  Patient Details Name: NATALIE LECLAIRE MRN: [id number] DOB: 08-10-1990 Today's Date: 01/02/2022       Information Obtained From: Patient  Able to Participate in Assessment/Interview: Yes  Patient Presentation: Responsive  Reason for Admission (Per Patient): Active Symptoms, Substance Abuse  Patient Stressors:    Coping Skills:   Film/video editor, Loss adjuster, chartered, Prayer  Leisure Interests (2+):  Games - Careers adviser, Sports - Swimming, Technical brewer - Investment banker, corporate of Recreation/Participation: Pharmacist, community Resources:  Yes  Community Resources:  Park, [employer]  Current Use: No  If no, Barriers?: Transport planner, English as a second language teacher, Surveyor, quantity  Expressed Interest in [employer] Information: Yes  [employer] of Residence:  [place]  Patient Main Form of Transportation: Therapist, music  Patient Strengths:  Caring, love people  Patient Identified Areas of Improvement:  Love myself more  Patient Goal for Hospitalization:  Get into treatment  Current SI (including self-harm):  Yes (No plan)  Current HI:  No  Current AVH: No  Staff Intervention Plan: Group Attendance, Collaborate with Interdisciplinary Treatment Team  Consent to Intern Participation: N/A  Tamya Denardo 01/02/2022, 1:51 PM

## 2022-01-02 NOTE — [facility] Group Notes (Signed)
[facility] Group Notes:  (Nursing/MHT/Case Management/Adjunct)  Date:  01/02/2022  Time:  1:41 PM  Type of Therapy:   Community Meeting  Participation Level:  Did Not Attend   Lynelle Smoke [facility] 01/02/2022, 1:41 PM

## 2022-01-02 NOTE — Progress Notes (Signed)
Recreation Therapy Notes  INPATIENT RECREATION TR PLAN  Patient Details Name: Nathan Koch MRN: [id number] DOB: 1990-11-22 Today's Date: 01/02/2022  Rec Therapy Plan Is patient appropriate for Therapeutic Recreation?: Yes Treatment times per week: at least 3 Estimated Length of Stay: 5-7 days TR Treatment/Interventions: Group participation (Comment)  Discharge Criteria Pt will be discharged from therapy if:: Discharged Treatment plan/goals/alternatives discussed and agreed upon by:: Patient/family  Discharge Summary     [name] 01/02/2022, 1:51 PM

## 2022-01-02 NOTE — Progress Notes (Signed)
[facility] MD Progress Note  01/02/2022 10:35 AM Nathan Koch  MRN:  [id number] Subjective: Mood feeling better.  No active suicidal ideation Principal Problem: Severe recurrent major depression without psychotic features ([name]) Diagnosis: Principal Problem:   Severe recurrent major depression without psychotic features ([name]) Active Problems:   Asthma   Methamphetamine use disorder, severe ([name])   Self-inflicted laceration of left wrist ([name])  Total Time spent with patient: 30 minutes  Past Psychiatric History: History of recurrent suicide attempts  Past Medical History:  Past Medical History:  Diagnosis Date   Anxiety    Anxiety disorder    Asthma    Bipolar 1 disorder ([name])    Depression    Methamphetamine abuse ([name])    PTSD (post-traumatic stress disorder)     Past Surgical History:  Procedure Laterality Date   CLOSED REDUCTION MANDIBLE N/A 07/02/2018   Procedure: CLOSED REDUCTION MANDIBULAR;  Surgeon: Vernie Murders, MD;  Location: [facility] ORS;  Service: ENT;  Laterality: N/A;   Family History:  Family History  Problem Relation Age of Onset   Asthma Mother    Cancer Mother        breast cancer   Ulcers Mother    Cancer Father        esophogeal cancer   Hypertension Father    Asthma Brother    Family Psychiatric  History: See previous Social History:  Social History   Substance and Sexual Activity  Alcohol Use Not Currently   Alcohol/week: 13.0 standard drinks of alcohol   Types: 6 Cans of beer, 7 Standard drinks or equivalent per week   Comment: previously heavy drinker 2016. most recent 4 beer/ day drinker and none since 01-11-2020     Social History   Substance and Sexual Activity  Drug Use Yes   Types: Methamphetamines   Comment: states he uses 1 g every other day    Social History   Socioeconomic History   Marital status: Single    Spouse name: Not on file   Number of children: 1   Years of education: 10   Highest education level: 10th grade   Occupational History   Not on file  Tobacco Use   Smoking status: Every Day    Packs/day: 0.50    Years: 8.00    Total pack years: 4.00    Types: Cigarettes   Smokeless tobacco: Never  Vaping Use   Vaping Use: Every day   Substances: Nicotine, Flavoring  Substance and Sexual Activity   Alcohol use: Not Currently    Alcohol/week: 13.0 standard drinks of alcohol    Types: 6 Cans of beer, 7 Standard drinks or equivalent per week    Comment: previously heavy drinker 2016. most recent 4 beer/ day drinker and none since 01-11-2020   Drug use: Yes    Types: Methamphetamines    Comment: states he uses 1 g every other day   Sexual activity: Not on file  Other Topics Concern   Not on file  Social History Narrative   Not on file   Social Determinants of Health   Financial Resource Strain: Not on file  Food Insecurity: Food Insecurity Present (12/30/2021)   Hunger Vital Sign    Worried About Running Out of Food in the Last Year: Sometimes true    Ran Out of Food in the Last Year: Sometimes true  Transportation Needs: Unmet Transportation Needs (12/30/2021)   PRAPARE - Administrator, Civil Service (Medical): Yes  Lack of Transportation (Non-Medical): Yes  Physical Activity: Not on file  Stress: Not on file  Social Connections: Not on file   Additional Social History:                         Sleep: Fair  Appetite:  Fair  Current Medications: Current Facility-Administered Medications  Medication Dose Route Frequency Provider Last Rate Last Admin   acetaminophen (TYLENOL) tablet 650 mg  650 mg Oral Q6H PRN Keyonia Gluth T, MD   650 mg at 01/01/22 0825   albuterol (VENTOLIN HFA) 108 (90 Base) MCG/ACT inhaler 2 puff  2 puff Inhalation Q6H PRN Kaston Faughn, Jackquline Denmark, MD   2 puff at 01/01/22 1748   alum & mag hydroxide-simeth (MAALOX/MYLANTA) 200-200-20 MG/5ML suspension 30 mL  30 mL Oral Q4H PRN Tomorrow Dehaas, Jackquline Denmark, MD       hydrocortisone cream 0.5 %   Topical BID  Haralambos Yeatts, Jackquline Denmark, MD   Given at 12/31/21 1712   hydrOXYzine (ATARAX) tablet 50 mg  50 mg Oral Q6H PRN Vianney Kopecky, Jackquline Denmark, MD   50 mg at 01/01/22 2108   lamoTRIgine (LAMICTAL) tablet 25 mg  25 mg Oral Daily Addisynn Vassell T, MD   25 mg at 01/02/22 4967   magnesium hydroxide (MILK OF MAGNESIA) suspension 30 mL  30 mL Oral Daily PRN Ankur Snowdon, Jackquline Denmark, MD       nicotine (NICODERM CQ - dosed in mg/24 hours) patch 14 mg  14 mg Transdermal Daily Tamarra Geiselman T, MD       QUEtiapine (SEROQUEL) tablet 100 mg  100 mg Oral QHS Deneene Tarver, Jackquline Denmark, MD   100 mg at 01/01/22 2108    Lab Results: No results found for this or any previous visit (from the past 48 hour(s)).  Blood Alcohol level:  Lab Results  Component Value Date   ETH <10 12/29/2021   ETH <10 11/30/2021    Metabolic Disorder Labs: Lab Results  Component Value Date   HGBA1C 6.0 (H) 01/17/2021   MPG 126 01/17/2021   MPG 126 10/08/2020   No results found for: "PROLACTIN" Lab Results  Component Value Date   CHOL 94 01/17/2021   TRIG 100 01/17/2021   HDL 28 (L) 01/17/2021   CHOLHDL 3.4 01/17/2021   VLDL 20 01/17/2021   LDLCALC 46 01/17/2021   LDLCALC 49 10/08/2020    Physical Findings: AIMS:  , ,  ,  ,    CIWA:    COWS:     Musculoskeletal: Strength & Muscle Tone: within normal limits Gait & Station: normal Patient leans: N/A  Psychiatric Specialty Exam:  Presentation  General Appearance: Appropriate for Environment  Eye Contact:Good  Speech:Clear and Coherent  Speech Volume:Normal  Handedness:Right   Mood and Affect  Mood:Euthymic  Affect:Congruent   Thought Process  Thought Processes:Coherent  Descriptions of Associations:Intact  Orientation:Full (Time, Place and Person)  Thought Content:Logical  History of Schizophrenia/Schizoaffective disorder:No  Duration of Psychotic Symptoms:N/A  Hallucinations:No data recorded Ideas of Reference:None  Suicidal Thoughts:No data recorded Homicidal Thoughts:No  data recorded  Sensorium  Memory:Immediate Good; Recent Good  Judgment:Fair  Insight:Fair   Executive Functions  Concentration:Fair  Attention Span:Good  Recall:Good  Fund of Knowledge:Fair  Language:Fair   Psychomotor Activity  Psychomotor Activity:No data recorded  Assets  Assets:Desire for Improvement; Resilience; Physical Health   Sleep  Sleep:No data recorded   Physical Exam: Physical Exam Vitals and nursing note reviewed.  Constitutional:  Appearance: Normal appearance.  HENT:     Head: Normocephalic and atraumatic.     Mouth/Throat:     Pharynx: Oropharynx is clear.  Eyes:     Pupils: Pupils are equal, round, and reactive to light.  Cardiovascular:     Rate and Rhythm: Normal rate and regular rhythm.  Pulmonary:     Effort: Pulmonary effort is normal.     Breath sounds: Normal breath sounds.  Abdominal:     General: Abdomen is flat.     Palpations: Abdomen is soft.  Musculoskeletal:        General: Normal range of motion.  Skin:    General: Skin is warm and dry.  Neurological:     General: No focal deficit present.     Mental Status: He is alert. Mental status is at baseline.  Psychiatric:        Mood and Affect: Mood normal.        Thought Content: Thought content normal.    Review of Systems  Constitutional: Negative.   HENT: Negative.    Eyes: Negative.   Respiratory: Negative.    Cardiovascular: Negative.   Gastrointestinal: Negative.   Musculoskeletal: Negative.   Skin: Negative.   Neurological: Negative.   Psychiatric/Behavioral: Negative.     Blood pressure 102/65, pulse (!) 55, temperature 98 F (36.7 C), temperature source Oral, resp. rate 18, height 5\' 8"  (1.727 m), weight 108.9 kg, SpO2 100 %. Body mass index is 36.49 kg/m.   Treatment Plan Summary: Medication management and Plan no change to medication.  Supportive counseling and review of treatment plan.  Encourage group attendance.  We will work on seeing what  can be discovered as far as stable discharge planning  , MD 01/02/2022, 10:35 AM

## 2022-01-02 NOTE — Progress Notes (Signed)
Pt is pleasant, somewhat irritable but cooperative. Patient denies any acute concerns but voiced frustration that his breakfast order was wrong. Patient does con't to report that he has depression and suicidal ideation. He states '' I still have those thoughts but I can be safe on the unit. '' Patient denies any acute concerns otherwise. He is able to make his needs known. Pt compliant with am medications. Pt is safe, did not complete self inventory. Will con't to monitor.

## 2022-01-02 NOTE — [facility] Group Notes (Signed)
[facility] Group Notes:  (Nursing/MHT/Case Management/Adjunct)  Date:  01/02/2022  Time:  3:27 AM  Type of Therapy:  Group Therapy  Participation Level:  Active  Participation Quality:  Appropriate  Affect:  Appropriate  Cognitive:  Alert  Insight:  Appropriate  Engagement in Group:  Engaged  Modes of Intervention:  Discussion  Summary of Progress/Problems:Wake up today.  Diona Browner 01/02/2022, 3:27 AM

## 2022-01-03 MED ORDER — LAMOTRIGINE 25 MG PO TABS
50.0000 mg | ORAL_TABLET | Freq: Every day | ORAL | Status: DC
  Administered 2022-01-04 – 2022-01-07 (×4): 50 mg via ORAL
  Filled 2022-01-03 (×4): qty 2

## 2022-01-03 NOTE — Progress Notes (Signed)
Patient refused his Nicotine patch and hydrocortisone cream, stating that he didn't need either at the moment.

## 2022-01-03 NOTE — Plan of Care (Signed)
Pt endorses anxiety 10/10, depression 7/10, and right side lateral abdominal pain 5/10. Prn medication for anxiety and pain were given. Pt endorses SI w/o plan while here and verbally contracts for safety. Pt denies HI/AVH at this time. Plan of care continued.   Problem: Education: Goal: Knowledge of General Education information will improve Description: Including pain rating scale, medication(s)/side effects and non-pharmacologic comfort measures Outcome: Progressing   Problem: Nutrition: Goal: Adequate nutrition will be maintained Outcome: Progressing

## 2022-01-03 NOTE — Progress Notes (Signed)
[facility] MD Progress Note  01/03/2022 10:24 AM Nathan Koch  MRN:  [id number] Subjective:  Pt endorses experiencing anxiety/depression. Pt also endorses SI w/o a plan while here and verbally contracts for safety while here. Pt denies experiencing HI/AVH at this time. Pt report having abdominal pain related to eating too much.  Patient evaluated in his room this morning; he slept well last night and is compliant with his medicines. He reports ongoing depression and passive SI. He has been on Lamictal 25 mg daily for a while now and is wanting to go up it now.  Principal Problem: Severe recurrent major depression without psychotic features ([name]) Diagnosis: Principal Problem:   Severe recurrent major depression without psychotic features ([name]) Active Problems:   Asthma   Methamphetamine use disorder, severe ([name])   Self-inflicted laceration of left wrist ([name])  Total Time spent with patient: 20 minutes  Past Psychiatric History: Bipolar, PTSD, Meth abuse                                      Past Medical History:  Past Medical History:  Diagnosis Date   Anxiety    Anxiety disorder    Asthma    Bipolar 1 disorder ([name])    Depression    Methamphetamine abuse ([name])    PTSD (post-traumatic stress disorder)     Past Surgical History:  Procedure Laterality Date   CLOSED REDUCTION MANDIBLE N/A 07/02/2018   Procedure: CLOSED REDUCTION MANDIBULAR;  Surgeon: Vernie Murders, MD;  Location: [facility] ORS;  Service: ENT;  Laterality: N/A;   Family History:  Family History  Problem Relation Age of Onset   Asthma Mother    Cancer Mother        breast cancer   Ulcers Mother    Cancer Father        esophogeal cancer   Hypertension Father    Asthma Brother    Family Psychiatric  History:  Social History:  Social History   Substance and Sexual Activity  Alcohol Use Not Currently   Alcohol/week: 13.0 standard drinks of alcohol   Types: 6 Cans of beer, 7 Standard drinks or equivalent per week    Comment: previously heavy drinker 2016. most recent 4 beer/ day drinker and none since 01-11-2020     Social History   Substance and Sexual Activity  Drug Use Yes   Types: Methamphetamines   Comment: states he uses 1 g every other day    Social History   Socioeconomic History   Marital status: Single    Spouse name: Not on file   Number of children: 1   Years of education: 10   Highest education level: 10th grade  Occupational History   Not on file  Tobacco Use   Smoking status: Every Day    Packs/day: 0.50    Years: 8.00    Total pack years: 4.00    Types: Cigarettes   Smokeless tobacco: Never  Vaping Use   Vaping Use: Every day   Substances: Nicotine, Flavoring  Substance and Sexual Activity   Alcohol use: Not Currently    Alcohol/week: 13.0 standard drinks of alcohol    Types: 6 Cans of beer, 7 Standard drinks or equivalent per week    Comment: previously heavy drinker 2016. most recent 4 beer/ day drinker and none since 01-11-2020   Drug use: Yes    Types: Methamphetamines  Comment: states he uses 1 g every other day   Sexual activity: Not on file  Other Topics Concern   Not on file  Social History Narrative   Not on file   Social Determinants of Health   Financial Resource Strain: Not on file  Food Insecurity: Food Insecurity Present (12/30/2021)   Hunger Vital Sign    Worried About Running Out of Food in the Last Year: Sometimes true    Ran Out of Food in the Last Year: Sometimes true  Transportation Needs: Unmet Transportation Needs (12/30/2021)   PRAPARE - Administrator, Civil Service (Medical): Yes    Lack of Transportation (Non-Medical): Yes  Physical Activity: Not on file  Stress: Not on file  Social Connections: Not on file   Additional Social History:                         Sleep: Good  Appetite:  Good  Current Medications: Current Facility-Administered Medications  Medication Dose Route Frequency Provider Last  Rate Last Admin   acetaminophen (TYLENOL) tablet 650 mg  650 mg Oral Q6H PRN Clapacs, [name], MD   650 mg at 01/01/22 0825   albuterol (VENTOLIN HFA) 108 (90 Base) MCG/ACT inhaler 2 puff  2 puff Inhalation Q6H PRN Clapacs, Jackquline Denmark, MD   2 puff at 01/03/22 0756   alum & mag hydroxide-simeth (MAALOX/MYLANTA) 200-200-20 MG/5ML suspension 30 mL  30 mL Oral Q4H PRN Clapacs, Jackquline Denmark, MD       hydrocortisone cream 0.5 %   Topical BID Clapacs, Jackquline Denmark, MD   Given at 12/31/21 1712   hydrOXYzine (ATARAX) tablet 50 mg  50 mg Oral Q6H PRN Clapacs, Jackquline Denmark, MD   50 mg at 01/01/22 2108   lamoTRIgine (LAMICTAL) tablet 25 mg  25 mg Oral Daily Clapacs, [name], MD   25 mg at 01/03/22 0756   magnesium hydroxide (MILK OF MAGNESIA) suspension 30 mL  30 mL Oral Daily PRN Clapacs, Jackquline Denmark, MD       nicotine (NICODERM CQ - dosed in mg/24 hours) patch 14 mg  14 mg Transdermal Daily Clapacs, [name], MD       QUEtiapine (SEROQUEL) tablet 100 mg  100 mg Oral QHS Clapacs, Jackquline Denmark, MD   100 mg at 01/02/22 2118    Lab Results: No results found for this or any previous visit (from the past 48 hour(s)).  Blood Alcohol level:  Lab Results  Component Value Date   ETH <10 12/29/2021   ETH <10 11/30/2021    Metabolic Disorder Labs: Lab Results  Component Value Date   HGBA1C 6.0 (H) 01/17/2021   MPG 126 01/17/2021   MPG 126 10/08/2020   No results found for: "PROLACTIN" Lab Results  Component Value Date   CHOL 94 01/17/2021   TRIG 100 01/17/2021   HDL 28 (L) 01/17/2021   CHOLHDL 3.4 01/17/2021   VLDL 20 01/17/2021   LDLCALC 46 01/17/2021   LDLCALC 49 10/08/2020    Physical Findings: AIMS:  , ,  ,  ,    CIWA:    COWS:     Musculoskeletal: Strength & Muscle Tone: within normal limits Gait & Station: normal Patient leans: N/A  Psychiatric Specialty Exam:  Presentation  General Appearance: Appropriate for Environment  Eye Contact:Good  Speech:Clear and Coherent  Speech  Volume:Normal  Handedness:Right   Mood and Affect  Mood: Depressed  Affect:Congruent   Thought Process  Thought Processes:Coherent  Descriptions of Associations:Intact  Orientation:Full (Time, Place and Person)  Thought Content:Logical  History of Schizophrenia/Schizoaffective disorder:No  Duration of Psychotic Symptoms:N/A  Hallucinations:Denies Ideas of Reference:None  Suicidal Thoughts: Endorses passive SI Homicidal Thoughts:Denies   Sensorium  Memory:Immediate Good; Recent Good  Judgment:Fair  Insight:Fair   Executive Functions  Concentration:Fair  Attention Span:Good  Recall:Good  Fund of Knowledge:Fair  Language:Fair   Psychomotor Activity  Psychomotor Activity:No data recorded  Assets  Assets:Desire for Improvement; Resilience; Physical Health   Sleep  Sleep:No data recorded   Physical Exam: Physical Exam ROS Blood pressure 111/67, pulse 67, temperature 97.9 F (36.6 C), temperature source Oral, resp. rate 16, height 5\' 8"  (1.727 m), weight 108.9 kg, SpO2 98 %. Body mass index is 36.49 kg/m.   Treatment Plan Summary: Daily contact with patient to assess and evaluate symptoms and progress in treatment, Medication management, and Plan : Increase Lamictal to 50 mg Daily.   Lakima Dona 01/03/2022, 10:24 AM

## 2022-01-03 NOTE — Plan of Care (Signed)
D- Patient alert and oriented. Patient presented in a pleasant mood on assessment stating that he "tossed and turned" last night, but had no complaints to voice to this Clinical research associate. Patient denied anxiety, but endorsed "a little" depression, without going into detail. Patient also denied SI, HI, AVH, and pain at this time. Patient had no stated goals for today.  A- Scheduled medications administered to patient, per MD orders. Support and encouragement provided.  Routine safety checks conducted every 15 minutes.  Patient informed to notify staff with problems or concerns.  R- No adverse drug reactions noted. Patient contracts for safety at this time. Patient compliant with medications and treatment plan. Patient receptive, calm, and cooperative. Patient interacts well with others on the unit.  Patient remains safe at this time.  Problem: Education: Goal: Knowledge of General Education information will improve Description: Including pain rating scale, medication(s)/side effects and non-pharmacologic comfort measures Outcome: Progressing   Problem: Health Behavior/Discharge Planning: Goal: Ability to manage health-related needs will improve Outcome: Progressing   Problem: Clinical Measurements: Goal: Ability to maintain clinical measurements within normal limits will improve Outcome: Progressing Goal: Will remain free from infection Outcome: Progressing Goal: Diagnostic test results will improve Outcome: Progressing Goal: Respiratory complications will improve Outcome: Progressing Goal: Cardiovascular complication will be avoided Outcome: Progressing   Problem: Activity: Goal: Risk for activity intolerance will decrease Outcome: Progressing   Problem: Nutrition: Goal: Adequate nutrition will be maintained Outcome: Progressing   Problem: Coping: Goal: Level of anxiety will decrease Outcome: Progressing   Problem: Elimination: Goal: Will not experience complications related to bowel  motility Outcome: Progressing Goal: Will not experience complications related to urinary retention Outcome: Progressing   Problem: Pain Managment: Goal: General experience of comfort will improve Outcome: Progressing   Problem: Safety: Goal: Ability to remain free from injury will improve Outcome: Progressing   Problem: Skin Integrity: Goal: Risk for impaired skin integrity will decrease Outcome: Progressing

## 2022-01-04 MED ORDER — QUETIAPINE FUMARATE 25 MG PO TABS
150.0000 mg | ORAL_TABLET | Freq: Every day | ORAL | Status: DC
  Administered 2022-01-04 – 2022-01-06 (×3): 150 mg via ORAL
  Filled 2022-01-04: qty 1
  Filled 2022-01-04 (×3): qty 2

## 2022-01-04 NOTE — Group Note (Signed)
[facility] LCSW Group Therapy Note   Group Date: 01/04/2022 Start Time: 1200 End Time: 1300   Type of Therapy/Topic:  Group Therapy:  Emotion Regulation  Participation Level:  Active   Mood: Euthymic   Description of Group:    The purpose of this group is to assist patients in learning to regulate negative emotions and experience positive emotions. Patients will be guided to discuss ways in which they have been vulnerable to their negative emotions. These vulnerabilities will be juxtaposed with experiences of positive emotions or situations, and patients challenged to use positive emotions to combat negative ones. Special emphasis will be placed on coping with negative emotions in conflict situations, and patients will process healthy conflict resolution skills.  Therapeutic Goals: Patient will identify two positive emotions or experiences to reflect on in order to balance out negative emotions:  Patient will label two or more emotions that they find the most difficult to experience:  Patient will be able to demonstrate positive conflict resolution skills through discussion or role plays:   Summary of Patient Progress: Patient was present for the entirety of the group session. Patient was an active listener and participated in the topic of discussion, provided helpful advice to others, and added nuance to topic of conversation. Patient provided examples during brainstorming activity. States that he has improved understanding and confidence in emotional regulation at conclusion of group.     Therapeutic Modalities:   Cognitive Behavioral Therapy Feelings Identification Dialectical Behavioral Therapy   Corky Crafts, [place]

## 2022-01-04 NOTE — Progress Notes (Signed)
D: Patient alert and oriented. Patient denies pain. Patient endorses anxiety and depression rating anxiety 9/10 and depression 7/10. Patient denies HI/AVH. Patient endorses SI with no plan. Patient verbally contracts for safety. Patient has been observed on the unit spending the day in the dayroom watching television with peers. Patient is interactive with peers and staff.  A: Scheduled medications administered to patient, per MD orders.  Support and encouragement provided to patient.  Q15 minute safety checks maintained.   R: Patient compliant with medication administration and treatment plan. No adverse drug reactions noted. Patient remains safe on the unit at this time.

## 2022-01-04 NOTE — Plan of Care (Signed)
  Problem: Education: Goal: Knowledge of General Education information will improve Description: Including pain rating scale, medication(s)/side effects and non-pharmacologic comfort measures Outcome: Progressing   Problem: Nutrition: Goal: Adequate nutrition will be maintained Outcome: Progressing   

## 2022-01-04 NOTE — Progress Notes (Signed)
[facility] MD Progress Note  01/04/2022 9:26 AM Nathan Koch  MRN:  [id number] Subjective:  Pt endorses anxiety 10/10, depression 7/10, and right side lateral abdominal pain 5/10. Prn medication for anxiety and pain were given. Pt endorses SI w/o plan while here and verbally contracts for safety. Pt denies HI/AVH at this time.   Patient has been calm, compliant with his medicines and generally appropriate on the floor. He reports some difficulty sleeping last night and has fleeting, passive SI. He is eating well, is taking care of his ADLs and is visible on the unit.  Principal Problem: Severe recurrent major depression without psychotic features ([name]) Diagnosis: Principal Problem:   Severe recurrent major depression without psychotic features ([name]) Active Problems:   Asthma   Methamphetamine use disorder, severe ([name])   Self-inflicted laceration of left wrist ([name])  Total Time spent with patient: 20 minutes  Past Psychiatric History: Bipolar, PTSD, Meth abuse  Past Medical History:  Past Medical History:  Diagnosis Date   Anxiety    Anxiety disorder    Asthma    Bipolar 1 disorder ([name])    Depression    Methamphetamine abuse ([name])    PTSD (post-traumatic stress disorder)     Past Surgical History:  Procedure Laterality Date   CLOSED REDUCTION MANDIBLE N/A 07/02/2018   Procedure: CLOSED REDUCTION MANDIBULAR;  Surgeon: Vernie Murders, MD;  Location: [facility] ORS;  Service: ENT;  Laterality: N/A;   Family History:  Family History  Problem Relation Age of Onset   Asthma Mother    Cancer Mother        breast cancer   Ulcers Mother    Cancer Father        esophogeal cancer   Hypertension Father    Asthma Brother    Family Psychiatric  History:  Social History:  Social History   Substance and Sexual Activity  Alcohol Use Not Currently   Alcohol/week: 13.0 standard drinks of alcohol   Types: 6 Cans of beer, 7 Standard drinks or equivalent per week   Comment: previously heavy  drinker 2016. most recent 4 beer/ day drinker and none since 01-11-2020     Social History   Substance and Sexual Activity  Drug Use Yes   Types: Methamphetamines   Comment: states he uses 1 g every other day    Social History   Socioeconomic History   Marital status: Single    Spouse name: Not on file   Number of children: 1   Years of education: 10   Highest education level: 10th grade  Occupational History   Not on file  Tobacco Use   Smoking status: Every Day    Packs/day: 0.50    Years: 8.00    Total pack years: 4.00    Types: Cigarettes   Smokeless tobacco: Never  Vaping Use   Vaping Use: Every day   Substances: Nicotine, Flavoring  Substance and Sexual Activity   Alcohol use: Not Currently    Alcohol/week: 13.0 standard drinks of alcohol    Types: 6 Cans of beer, 7 Standard drinks or equivalent per week    Comment: previously heavy drinker 2016. most recent 4 beer/ day drinker and none since 01-11-2020   Drug use: Yes    Types: Methamphetamines    Comment: states he uses 1 g every other day   Sexual activity: Not on file  Other Topics Concern   Not on file  Social History Narrative   Not on file  Social Determinants of Health   Financial Resource Strain: Not on file  Food Insecurity: Food Insecurity Present (12/30/2021)   Hunger Vital Sign    Worried About Running Out of Food in the Last Year: Sometimes true    Ran Out of Food in the Last Year: Sometimes true  Transportation Needs: Unmet Transportation Needs (12/30/2021)   PRAPARE - Administrator, Civil Service (Medical): Yes    Lack of Transportation (Non-Medical): Yes  Physical Activity: Not on file  Stress: Not on file  Social Connections: Not on file   Additional Social History:                         Sleep: Fair  Appetite:  Fair  Current Medications: Current Facility-Administered Medications  Medication Dose Route Frequency Provider Last Rate Last Admin    acetaminophen (TYLENOL) tablet 650 mg  650 mg Oral Q6H PRN Clapacs, Jackquline Denmark, MD   650 mg at 01/03/22 2123   albuterol (VENTOLIN HFA) 108 (90 Base) MCG/ACT inhaler 2 puff  2 puff Inhalation Q6H PRN Clapacs, Jackquline Denmark, MD   2 puff at 01/04/22 0914   alum & mag hydroxide-simeth (MAALOX/MYLANTA) 200-200-20 MG/5ML suspension 30 mL  30 mL Oral Q4H PRN Clapacs, Jackquline Denmark, MD       hydrocortisone cream 0.5 %   Topical BID Clapacs, Jackquline Denmark, MD   Given at 12/31/21 1712   hydrOXYzine (ATARAX) tablet 50 mg  50 mg Oral Q6H PRN Clapacs, [name], MD   50 mg at 01/03/22 2122   lamoTRIgine (LAMICTAL) tablet 50 mg  50 mg Oral Daily [place]   50 mg at 01/04/22 1027   magnesium hydroxide (MILK OF MAGNESIA) suspension 30 mL  30 mL Oral Daily PRN Clapacs, Jackquline Denmark, MD       nicotine (NICODERM CQ - dosed in mg/24 hours) patch 14 mg  14 mg Transdermal Daily Clapacs, [name], MD       QUEtiapine (SEROQUEL) tablet 100 mg  100 mg Oral QHS Clapacs, Jackquline Denmark, MD   100 mg at 01/03/22 2122    Lab Results: No results found for this or any previous visit (from the past 48 hour(s)).  Blood Alcohol level:  Lab Results  Component Value Date   ETH <10 12/29/2021   ETH <10 11/30/2021    Metabolic Disorder Labs: Lab Results  Component Value Date   HGBA1C 6.0 (H) 01/17/2021   MPG 126 01/17/2021   MPG 126 10/08/2020   No results found for: "PROLACTIN" Lab Results  Component Value Date   CHOL 94 01/17/2021   TRIG 100 01/17/2021   HDL 28 (L) 01/17/2021   CHOLHDL 3.4 01/17/2021   VLDL 20 01/17/2021   LDLCALC 46 01/17/2021   LDLCALC 49 10/08/2020    Physical Findings: AIMS:  , ,  ,  ,    CIWA:    COWS:     Musculoskeletal: Strength & Muscle Tone: within normal limits Gait & Station: normal Patient leans: N/A  Psychiatric Specialty Exam:  Presentation  General Appearance: Appropriate for Environment  Eye Contact:Good  Speech:Clear and Coherent  Speech Volume:Normal  Handedness:Right   Mood and Affect   Mood:Euthymic  Affect:Congruent   Thought Process  Thought Processes:Coherent  Descriptions of Associations:Intact  Orientation:Full (Time, Place and Person)  Thought Content:Logical  History of Schizophrenia/Schizoaffective disorder:No  Duration of Psychotic Symptoms:N/A  Hallucinations:No data recorded Ideas of Reference:None  Suicidal Thoughts:No data recorded Homicidal  Thoughts:No data recorded  Sensorium  Memory:Immediate Good; Recent Good  Judgment:Fair  Insight:Fair   Executive Functions  Concentration:Fair  Attention Span:Good  Recall:Good  Fund of Knowledge:Fair  Language:Fair   Psychomotor Activity  Psychomotor Activity:No data recorded  Assets  Assets:Desire for Improvement; Resilience; Physical Health   Sleep  Sleep:No data recorded   Physical Exam: Physical Exam Constitutional:      Appearance: Normal appearance. He is normal weight.  HENT:     Head: Normocephalic and atraumatic.     Right Ear: External ear normal.     Left Ear: External ear normal.     Nose: Nose normal.     Mouth/Throat:     Mouth: Mucous membranes are dry.     Pharynx: Oropharynx is clear.  Eyes:     Extraocular Movements: Extraocular movements intact.     Conjunctiva/sclera: Conjunctivae normal.     Pupils: Pupils are equal, round, and reactive to light.  Cardiovascular:     Rate and Rhythm: Normal rate and regular rhythm.     Pulses: Normal pulses.     Heart sounds: Normal heart sounds.  Pulmonary:     Effort: Pulmonary effort is normal.     Breath sounds: Normal breath sounds.  Abdominal:     General: Abdomen is flat. Bowel sounds are normal.     Palpations: Abdomen is soft.  Musculoskeletal:        General: Normal range of motion.     Cervical back: Normal range of motion and neck supple.  Skin:    General: Skin is warm and dry.  Neurological:     General: No focal deficit present.     Mental Status: He is alert. Mental status is at  baseline.    Review of Systems  Constitutional: Negative.   HENT: Negative.    Eyes: Negative.   Respiratory: Negative.    Cardiovascular: Negative.   Gastrointestinal: Negative.   Genitourinary: Negative.   Musculoskeletal: Negative.   Skin: Negative.   Neurological: Negative.   Endo/Heme/Allergies: Negative.   Psychiatric/Behavioral:  Positive for depression and suicidal ideas. The patient is nervous/anxious and has insomnia.    Blood pressure 106/65, pulse 63, temperature 97.9 F (36.6 C), temperature source Oral, resp. rate 18, height 5\' 8"  (1.727 m), weight 108.9 kg, SpO2 100 %. Body mass index is 36.49 kg/m.   Treatment Plan Summary: Daily contact with patient to assess and evaluate symptoms and progress in treatment, Medication management, and Plan : Increase Seroquel to 150 mg HS.   01/04/2022, 9:26 AM

## 2022-01-05 DIAGNOSIS — F332 Major depressive disorder, recurrent severe without psychotic features: Secondary | ICD-10-CM | POA: Diagnosis not present

## 2022-01-05 NOTE — Group Note (Signed)
[facility] LCSW Group Therapy Note    Group Date: 01/05/2022 Start Time: 1300 End Time: 1400  Type of Therapy and Topic:  Group Therapy:  Overcoming Obstacles  Participation Level:  [facility] PARTICIPATION LEVEL: Active   Description of Group:   In this group patients will be encouraged to explore what they see as obstacles to their own wellness and recovery. They will be guided to discuss their thoughts, feelings, and behaviors related to these obstacles. The group will process together ways to cope with barriers, with attention given to specific choices patients can make. Each patient will be challenged to identify changes they are motivated to make in order to overcome their obstacles. This group will be process-oriented, with patients participating in exploration of their own experiences as well as giving and receiving support and challenge from other group members.  Therapeutic Goals: 1. Patient will identify personal and current obstacles as they relate to admission. 2. Patient will identify barriers that currently interfere with their wellness or overcoming obstacles.  3. Patient will identify feelings, thought process and behaviors related to these barriers. 4. Patient will identify two changes they are willing to make to overcome these obstacles:    Summary of Patient Progress Patient was present for the entirety of the group process. He was actively involved in the discussion. Pt has some insight into the discussion and his life. His comments were on topic and relative. He appeared open and receptive to feedback/comments from both peers and facilitator.    Therapeutic Modalities:   Cognitive Behavioral Therapy Solution Focused Therapy Motivational Interviewing Relapse Prevention Therapy   Glenis Smoker, LCSW

## 2022-01-05 NOTE — Progress Notes (Signed)
[facility] MD Progress Note  01/05/2022 3:02 PM Nathan Koch  MRN:  [id number] Subjective: Patient says he is feeling a little bit better.  Less depressed and anxious.  Looking forward to making contact with some places in [facility] where he would like to be living.  No active suicidal thoughts. Principal Problem: Severe recurrent major depression without psychotic features ([name]) Diagnosis: Principal Problem:   Severe recurrent major depression without psychotic features ([name]) Active Problems:   Asthma   Methamphetamine use disorder, severe ([name])   Self-inflicted laceration of left wrist ([name])  Total Time spent with patient: 30 minutes  Past Psychiatric History: Past history of substance use depression and recurrent suicide attempts  Past Medical History:  Past Medical History:  Diagnosis Date   Anxiety    Anxiety disorder    Asthma    Bipolar 1 disorder ([name])    Depression    Methamphetamine abuse ([name])    PTSD (post-traumatic stress disorder)     Past Surgical History:  Procedure Laterality Date   CLOSED REDUCTION MANDIBLE N/A 07/02/2018   Procedure: CLOSED REDUCTION MANDIBULAR;  Surgeon: Vernie Murders, MD;  Location: [facility] ORS;  Service: ENT;  Laterality: N/A;   Family History:  Family History  Problem Relation Age of Onset   Asthma Mother    Cancer Mother        breast cancer   Ulcers Mother    Cancer Father        esophogeal cancer   Hypertension Father    Asthma Brother    Family Psychiatric  History: See previous Social History:  Social History   Substance and Sexual Activity  Alcohol Use Not Currently   Alcohol/week: 13.0 standard drinks of alcohol   Types: 6 Cans of beer, 7 Standard drinks or equivalent per week   Comment: previously heavy drinker 2016. most recent 4 beer/ day drinker and none since 01-11-2020     Social History   Substance and Sexual Activity  Drug Use Yes   Types: Methamphetamines   Comment: states he uses 1 g every other day     Social History   Socioeconomic History   Marital status: Single    Spouse name: Not on file   Number of children: 1   Years of education: 10   Highest education level: 10th grade  Occupational History   Not on file  Tobacco Use   Smoking status: Every Day    Packs/day: 0.50    Years: 8.00    Total pack years: 4.00    Types: Cigarettes   Smokeless tobacco: Never  Vaping Use   Vaping Use: Every day   Substances: Nicotine, Flavoring  Substance and Sexual Activity   Alcohol use: Not Currently    Alcohol/week: 13.0 standard drinks of alcohol    Types: 6 Cans of beer, 7 Standard drinks or equivalent per week    Comment: previously heavy drinker 2016. most recent 4 beer/ day drinker and none since 01-11-2020   Drug use: Yes    Types: Methamphetamines    Comment: states he uses 1 g every other day   Sexual activity: Not on file  Other Topics Concern   Not on file  Social History Narrative   Not on file   Social Determinants of Health   Financial Resource Strain: Not on file  Food Insecurity: Food Insecurity Present (12/30/2021)   Hunger Vital Sign    Worried About Running Out of Food in the Last Year: Sometimes true  Ran Out of Food in the Last Year: Sometimes true  Transportation Needs: Unmet Transportation Needs (12/30/2021)   PRAPARE - Administrator, Civil Service (Medical): Yes    Lack of Transportation (Non-Medical): Yes  Physical Activity: Not on file  Stress: Not on file  Social Connections: Not on file   Additional Social History:                         Sleep: Fair  Appetite:  Fair  Current Medications: Current Facility-Administered Medications  Medication Dose Route Frequency Provider Last Rate Last Admin   acetaminophen (TYLENOL) tablet 650 mg  650 mg Oral Q6H PRN Kyree Adriano T, MD   650 mg at 01/04/22 2113   albuterol (VENTOLIN HFA) 108 (90 Base) MCG/ACT inhaler 2 puff  2 puff Inhalation Q6H PRN Osmel Dykstra, Jackquline Denmark, MD   2 puff at  01/05/22 1439   alum & mag hydroxide-simeth (MAALOX/MYLANTA) 200-200-20 MG/5ML suspension 30 mL  30 mL Oral Q4H PRN Willy Pinkerton, Jackquline Denmark, MD       hydrocortisone cream 0.5 %   Topical BID Gudrun Axe, Jackquline Denmark, MD   Given at 12/31/21 1712   hydrOXYzine (ATARAX) tablet 50 mg  50 mg Oral Q6H PRN Kaulin Chaves T, MD   50 mg at 01/04/22 2112   lamoTRIgine (LAMICTAL) tablet 50 mg  50 mg Oral Daily Malik, Kashif   50 mg at 01/05/22 0840   magnesium hydroxide (MILK OF MAGNESIA) suspension 30 mL  30 mL Oral Daily PRN Loa Idler, Jackquline Denmark, MD       nicotine (NICODERM CQ - dosed in mg/24 hours) patch 14 mg  14 mg Transdermal Daily Marinus Eicher T, MD       QUEtiapine (SEROQUEL) tablet 150 mg  150 mg Oral QHS Malik, Kashif   150 mg at 01/04/22 2113    Lab Results: No results found for this or any previous visit (from the past 48 hour(s)).  Blood Alcohol level:  Lab Results  Component Value Date   ETH <10 12/29/2021   ETH <10 11/30/2021    Metabolic Disorder Labs: Lab Results  Component Value Date   HGBA1C 6.0 (H) 01/17/2021   MPG 126 01/17/2021   MPG 126 10/08/2020   No results found for: "PROLACTIN" Lab Results  Component Value Date   CHOL 94 01/17/2021   TRIG 100 01/17/2021   HDL 28 (L) 01/17/2021   CHOLHDL 3.4 01/17/2021   VLDL 20 01/17/2021   LDLCALC 46 01/17/2021   LDLCALC 49 10/08/2020    Physical Findings: AIMS:  , ,  ,  ,    CIWA:    COWS:     Musculoskeletal: Strength & Muscle Tone: within normal limits Gait & Station: normal Patient leans: N/A  Psychiatric Specialty Exam:  Presentation  General Appearance: Appropriate for Environment; Casual; Neat  Eye Contact:Fair  Speech:Normal Rate  Speech Volume:Decreased  Handedness:Right   Mood and Affect  Mood:Anxious; Depressed  Affect:Appropriate; Full Range   Thought Process  Thought Processes:Coherent  Descriptions of Associations:Intact  Orientation:Full (Time, Place and Person)  Thought Content:Logical  History  of Schizophrenia/Schizoaffective disorder:No  Duration of Psychotic Symptoms:N/A  Hallucinations:Hallucinations: None  Ideas of Reference:None  Suicidal Thoughts:Suicidal Thoughts: Yes, Passive SI Passive Intent and/or Plan: Without Intent  Homicidal Thoughts:Homicidal Thoughts: No   Sensorium  Memory:Immediate Good; Recent Good  Judgment:Fair  Insight:Fair   Executive Functions  Concentration:Fair  Attention Span:Fair  Recall:Fair  Fund of Knowledge:Fair  Language:Fair   Psychomotor Activity  Psychomotor Activity:Psychomotor Activity: Normal   Assets  Assets:Communication Skills; Desire for Improvement; Housing; Social Support   Sleep  Sleep:Sleep: Fair    Physical Exam: Physical Exam Vitals and nursing note reviewed.  Constitutional:      Appearance: Normal appearance.  HENT:     Head: Normocephalic and atraumatic.     Mouth/Throat:     Pharynx: Oropharynx is clear.  Eyes:     Pupils: Pupils are equal, round, and reactive to light.  Cardiovascular:     Rate and Rhythm: Normal rate and regular rhythm.  Pulmonary:     Effort: Pulmonary effort is normal.     Breath sounds: Normal breath sounds.  Abdominal:     General: Abdomen is flat.     Palpations: Abdomen is soft.  Musculoskeletal:        General: Normal range of motion.  Skin:    General: Skin is warm and dry.  Neurological:     General: No focal deficit present.     Mental Status: He is alert. Mental status is at baseline.  Psychiatric:        Mood and Affect: Mood normal.        Thought Content: Thought content normal.    Review of Systems  Constitutional: Negative.   HENT: Negative.    Eyes: Negative.   Respiratory: Negative.    Cardiovascular: Negative.   Gastrointestinal: Negative.   Musculoskeletal: Negative.   Skin: Negative.   Neurological: Negative.   Psychiatric/Behavioral:  Positive for depression. Negative for suicidal ideas.    Blood pressure 110/63, pulse 69,  temperature 97.8 F (36.6 C), temperature source Oral, resp. rate 18, height 5\' 8"  (1.727 m), weight 108.9 kg, SpO2 100 %. Body mass index is 36.49 kg/m.   Treatment Plan Summary: Medication management and Plan no change to medication.  Supportive counseling encouraging him to make sure he is daily keeping in touch with his options for discharge.  , MD 01/05/2022, 3:02 PM

## 2022-01-05 NOTE — BH IP Treatment Plan (Signed)
Interdisciplinary Treatment and Diagnostic Plan Update  01/05/2022 Time of Session: 8:30AM Nathan Koch MRN: [id number]  Principal Diagnosis: Severe recurrent major depression without psychotic features ([name])  Secondary Diagnoses: Principal Problem:   Severe recurrent major depression without psychotic features ([name]) Active Problems:   Asthma   Methamphetamine use disorder, severe ([name])   Self-inflicted laceration of left wrist ([name])   Current Medications:  Current Facility-Administered Medications  Medication Dose Route Frequency Provider Last Rate Last Admin   acetaminophen (TYLENOL) tablet 650 mg  650 mg Oral Q6H PRN Clapacs, Jackquline Denmark, MD   650 mg at 01/04/22 2113   albuterol (VENTOLIN HFA) 108 (90 Base) MCG/ACT inhaler 2 puff  2 puff Inhalation Q6H PRN Clapacs, Jackquline Denmark, MD   2 puff at 01/05/22 0839   alum & mag hydroxide-simeth (MAALOX/MYLANTA) 200-200-20 MG/5ML suspension 30 mL  30 mL Oral Q4H PRN Clapacs, Jackquline Denmark, MD       hydrocortisone cream 0.5 %   Topical BID Clapacs, Jackquline Denmark, MD   Given at 12/31/21 1712   hydrOXYzine (ATARAX) tablet 50 mg  50 mg Oral Q6H PRN Clapacs, [name], MD   50 mg at 01/04/22 2112   lamoTRIgine (LAMICTAL) tablet 50 mg  50 mg Oral Daily Malik, Kashif   50 mg at 01/05/22 0840   magnesium hydroxide (MILK OF MAGNESIA) suspension 30 mL  30 mL Oral Daily PRN Clapacs, Jackquline Denmark, MD       nicotine (NICODERM CQ - dosed in mg/24 hours) patch 14 mg  14 mg Transdermal Daily Clapacs, [name], MD       QUEtiapine (SEROQUEL) tablet 150 mg  150 mg Oral QHS Malik, Kashif   150 mg at 01/04/22 2113   PTA Medications: Medications Prior to Admission  Medication Sig Dispense Refill Last Dose   albuterol (VENTOLIN HFA) 108 (90 Base) MCG/ACT inhaler Inhale 2 puffs into the lungs every 6 (six) hours as needed for shortness of breath. 6.7 g 0    hydrOXYzine (ATARAX) 50 MG tablet Take 1 tablet (50 mg total) by mouth 3 (three) times daily as needed for anxiety. 20 tablet 0     lamoTRIgine (LAMICTAL) 25 MG tablet Take 1 tablet (25 mg total) by mouth daily. 10 tablet 0    QUEtiapine (SEROQUEL) 100 MG tablet Take 1 tablet (100 mg total) by mouth at bedtime. (Patient not taking: Reported on 12/29/2021) 10 tablet 0    traZODone (DESYREL) 100 MG tablet Take 1 tablet (100 mg total) by mouth at bedtime as needed for sleep. (Patient not taking: Reported on 12/29/2021) 10 tablet 0     Patient Stressors: Health problems   Medication change or noncompliance   Substance abuse    Patient Strengths: Ability for insight  Active sense of humor  Motivation for treatment/growth   Treatment Modalities: Medication Management, Group therapy, Case management,  1 to 1 session with clinician, Psychoeducation, Recreational therapy.   Physician Treatment Plan for Primary Diagnosis: Severe recurrent major depression without psychotic features ([name]) Long Term Goal(s): Improvement in symptoms so as ready for discharge   Short Term Goals: Compliance with prescribed medications will improve Ability to identify triggers associated with substance abuse/mental health issues will improve Ability to verbalize feelings will improve Ability to disclose and discuss suicidal ideas Ability to demonstrate self-control will improve  Medication Management: Evaluate patient's response, side effects, and tolerance of medication regimen.  Therapeutic Interventions: 1 to 1 sessions, Unit Group sessions and Medication administration.  Evaluation of Outcomes:  Progressing  Physician Treatment Plan for Secondary Diagnosis: Principal Problem:   Severe recurrent major depression without psychotic features ([name]) Active Problems:   Asthma   Methamphetamine use disorder, severe ([name])   Self-inflicted laceration of left wrist ([name])  Long Term Goal(s): Improvement in symptoms so as ready for discharge   Short Term Goals: Compliance with prescribed medications will improve Ability to identify triggers  associated with substance abuse/mental health issues will improve Ability to verbalize feelings will improve Ability to disclose and discuss suicidal ideas Ability to demonstrate self-control will improve     Medication Management: Evaluate patient's response, side effects, and tolerance of medication regimen.  Therapeutic Interventions: 1 to 1 sessions, Unit Group sessions and Medication administration.  Evaluation of Outcomes: Progressing   RN Treatment Plan for Primary Diagnosis: Severe recurrent major depression without psychotic features ([name]) Long Term Goal(s): Knowledge of disease and therapeutic regimen to maintain health will improve  Short Term Goals: Ability to demonstrate self-control, Ability to participate in decision making will improve, Ability to verbalize feelings will improve, Ability to disclose and discuss suicidal ideas, Ability to identify and develop effective coping behaviors will improve, and Compliance with prescribed medications will improve  Medication Management: RN will administer medications as ordered by provider, will assess and evaluate patient's response and provide education to patient for prescribed medication. RN will report any adverse and/or side effects to prescribing provider.  Therapeutic Interventions: 1 on 1 counseling sessions, Psychoeducation, Medication administration, Evaluate responses to treatment, Monitor vital signs and CBGs as ordered, Perform/monitor CIWA, COWS, AIMS and Fall Risk screenings as ordered, Perform wound care treatments as ordered.  Evaluation of Outcomes: Progressing   LCSW Treatment Plan for Primary Diagnosis: Severe recurrent major depression without psychotic features ([name]) Long Term Goal(s): Safe transition to appropriate next level of care at discharge, Engage patient in therapeutic group addressing interpersonal concerns.  Short Term Goals: Engage patient in aftercare planning with referrals and resources, Increase  social support, Increase ability to appropriately verbalize feelings, Increase emotional regulation, Facilitate acceptance of mental health diagnosis and concerns, and Increase skills for wellness and recovery  Therapeutic Interventions: Assess for all discharge needs, 1 to 1 time with Social worker, Explore available resources and support systems, Assess for adequacy in community support network, Educate family and significant other(s) on suicide prevention, Complete Psychosocial Assessment, Interpersonal group therapy.  Evaluation of Outcomes: Progressing   Progress in Treatment: Attending groups: Yes. Participating in groups: Yes. Taking medication as prescribed: Yes. Toleration medication: Yes. Family/Significant other contact made: No, will contact:  once permission is given. Patient understands diagnosis: Yes. Discussing patient identified problems/goals with staff: Yes. Medical problems stabilized or resolved: Yes. Denies suicidal/homicidal ideation: Yes. Issues/concerns per patient self-inventory: No. Other: none  New problem(s) identified: No, Describe:  none  New Short Term/Long Term Goal(s): detox, medication management for mood stabilization; elimination of SI thoughts; development of comprehensive mental wellness/sobriety plan. Update 01/05/2022:  No changes at this time.    Patient Goals:  "I would like to work on myself, get out of my own way and go to rehab".  Update 01/05/2022:  No changes at this time.    Discharge Plan or Barriers: CSW to assist patient in development of appropriate discharge plans. Update 01/05/2022:  CSW team to continue to assist the patient in developing appropriate discharge plans.  CSW has been assisting the patient in completing referrals for SUD facilities.   Reason for Continuation of Hospitalization: Anxiety Depression Medication stabilization Suicidal ideation Withdrawal symptoms  Estimated Length of Stay:  1-7 days  Update 01/05/2022:   TBD   Last 3 [place] Suicide Severity Risk Score: Flowsheet Row Admission (Current) from 12/30/2021 in [place] ED to Hosp-Admission (Discharged) from 12/29/2021 in [place] Admission (Discharged) from 12/04/2021 in [facility] Low Risk High Risk High Risk       Last PHQ 2/9 Scores:    04/15/2021    2:16 AM 02/05/2021    4:55 AM 01/15/2021    8:04 AM  Depression screen PHQ 2/9  Decreased Interest 1 2 1   Down, Depressed, Hopeless 1 2 1   PHQ - 2 Score 2 4 2   Altered sleeping 2 0 0  Tired, decreased energy 2 1 0  Change in appetite 2 0 0  Feeling bad or failure about yourself  2 1 2   Trouble concentrating 2 1 0  Moving slowly or fidgety/restless 2 0   Suicidal thoughts 2 2 2   PHQ-9 Score 16 9 6   Difficult doing work/chores Very difficult Extremely dIfficult     Scribe for Treatment Team: Rozann Lesches, LCSW 01/05/2022 9:10 AM

## 2022-01-05 NOTE — BHH Counselor (Addendum)
CSW attempted to contact [facility] to follow up regarding referral. Unable to establish contact and unable to leave voicemail due to mailbox being full.   CSW will continue to follow.   Vilma Meckel. Algis Greenhouse, MSW, LCSW, LCAS 01/05/2022 3:13 PM  CSW called [facility] [phone]). Contact unable to be established but HIPPA compliant voicemail left with contact information for follow through.   Vilma Meckel. Algis Greenhouse, MSW, LCSW, LCAS 01/05/2022 3:29 PM

## 2022-01-05 NOTE — BHH Group Notes (Signed)
B[facility] Group Notes:  (Nursing/MHT/Case Management/Adjunct)  Date:  01/05/2022  Time:  10:44 PM  Type of Therapy:  Group Therapy wrap up   Participation Level:  Active  Participation Quality:  Appropriate  Affect:  Appropriate  Cognitive:  Alert, Appropriate, and Oriented  Insight:  Appropriate and Good  Engagement in Group:  Engaged and Supportive  Modes of Intervention:  Activity, Discussion, and Education  Summary of Progress/Problems: pt is very supportive to other pt and the stories shared. Pt gives positive feedback based on life experiences.  Pt expressed his desire to go to [facility] in [place] but states he is scared he will have to go back to the [facility] because his belongings are still there. He is unsure how he will get his things if he wants to go else where.   Maglione,Lashya Passe E 01/05/2022, 10:44 PM

## 2022-01-05 NOTE — Progress Notes (Signed)
Recreation Therapy Notes   Date: 01/05/2022  Time: 10:30am   Location: Craft room   Behavioral response: Not-Engaged   Intervention Topic: Problem-Solving   Discussion/Intervention:  Group content on today was focused on problem solving. The group described what problem solving is. Patients expressed how problems affect them and how they deal with problems. Individuals identified healthy ways to deal with problems. Patients explained what normally happens to them when they do not deal with problems. The group expressed reoccurring problems for them. The group participated in the intervention "Ways to Solve problems" where patients were given a chance to explore different ways to solve problems.   Clinical Observations/Feedback:  Patient came to group and was not engaged.  Sheyanne Munley LRT/CTRS            Raenah Murley 01/05/2022 11:47 AM

## 2022-01-05 NOTE — Progress Notes (Signed)
   01/04/22 2000  Psych Admission Type (Psych Patients Only)  Admission Status Voluntary  Psychosocial Assessment  Patient Complaints Anxiety;Depression  Eye Contact Fair  Facial Expression Anxious  Affect Depressed  Speech Logical/coherent  Interaction Assertive  Motor Activity Slow  Appearance/Hygiene Unremarkable  Behavior Characteristics Cooperative;Appropriate to situation  Mood Depressed;Anxious  Aggressive Behavior  Effect No apparent injury  Thought Process  Coherency WDL  Content WDL  Delusions None reported or observed  Perception WDL  Hallucination None reported or observed  Judgment WDL  Confusion None  Danger to Self  Current suicidal ideation? Passive  Description of Suicide Plan no plan  Self-Injurious Behavior  (none)  Agreement Not to Harm Self Yes  Description of Agreement verbal

## 2022-01-05 NOTE — Plan of Care (Signed)
D: Patient alert and oriented. Patient denies pain. Patient denies depression. Patient rates anxiety 7/10. Patient states that anxiety is usually around a 8 or 9/10. Patient denies HI/AVH.  Patient endorses SI with no plan. Patient verbally contracts for safety.  Patient has been interactive with staff and peers throughout the day. Patient has been in the dayroom watching television and interacting with peers.  A: Scheduled medications administered to patient, per MD orders.  Support and encouragement provided to patient.  Q15 minute safety checks maintained.   R: Patient compliant with medication administration and treatment plan. No adverse drug reactions noted. Patient remains safe on the unit at this time.  Problem: Education: Goal: Knowledge of General Education information will improve Description: Including pain rating scale, medication(s)/side effects and non-pharmacologic comfort measures Outcome: Progressing   Problem: Education: Goal: Knowledge of [place] General Education information/materials will improve Outcome: Progressing Goal: Verbalization of understanding the information provided will improve Outcome: Progressing   Problem: Health Behavior/Discharge Planning: Goal: Compliance with treatment plan for underlying cause of condition will improve Outcome: Progressing   Problem: Physical Regulation: Goal: Ability to maintain clinical measurements within normal limits will improve Outcome: Progressing

## 2022-01-06 ENCOUNTER — Other Ambulatory Visit: Payer: Self-pay

## 2022-01-06 DIAGNOSIS — F332 Major depressive disorder, recurrent severe without psychotic features: Secondary | ICD-10-CM | POA: Diagnosis not present

## 2022-01-06 MED ORDER — HYDROCORTISONE 0.5 % EX CREA
TOPICAL_CREAM | Freq: Two times a day (BID) | CUTANEOUS | 0 refills | Status: DC
Start: 2022-01-06 — End: 2022-01-07
  Filled 2022-01-06: qty 30, fill #0

## 2022-01-06 MED ORDER — QUETIAPINE FUMARATE 50 MG PO TABS
150.0000 mg | ORAL_TABLET | Freq: Every day | ORAL | 0 refills | Status: DC
Start: 2022-01-06 — End: 2022-01-07
  Filled 2022-01-06: qty 90, 30d supply, fill #0

## 2022-01-06 MED ORDER — ALBUTEROL SULFATE HFA 108 (90 BASE) MCG/ACT IN AERS
2.0000 | INHALATION_SPRAY | Freq: Four times a day (QID) | RESPIRATORY_TRACT | 0 refills | Status: DC | PRN
Start: 2022-01-06 — End: 2022-01-07
  Filled 2022-01-06: qty 6.7, 25d supply, fill #0

## 2022-01-06 MED ORDER — LAMOTRIGINE 25 MG PO TABS
50.0000 mg | ORAL_TABLET | Freq: Every day | ORAL | 0 refills | Status: DC
Start: 2022-01-07 — End: 2022-01-07
  Filled 2022-01-06: qty 60, 30d supply, fill #0

## 2022-01-06 MED ORDER — HYDROXYZINE HCL 50 MG PO TABS
50.0000 mg | ORAL_TABLET | Freq: Four times a day (QID) | ORAL | 0 refills | Status: DC | PRN
Start: 2022-01-06 — End: 2022-01-07
  Filled 2022-01-06: qty 60, 15d supply, fill #0

## 2022-01-06 MED ORDER — NICOTINE 14 MG/24HR TD PT24
14.0000 mg | MEDICATED_PATCH | Freq: Every day | TRANSDERMAL | 0 refills | Status: DC
  Filled 2022-01-06: qty 28, 28d supply, fill #0

## 2022-01-06 NOTE — Plan of Care (Signed)
  Problem: Education: Goal: Knowledge of General Education information will improve Description: Including pain rating scale, medication(s)/side effects and non-pharmacologic comfort measures Outcome: Progressing   Problem: Health Behavior/Discharge Planning: Goal: Ability to manage health-related needs will improve Outcome: Progressing   Problem: Clinical Measurements: Goal: Ability to maintain clinical measurements within normal limits will improve Outcome: Progressing Goal: Will remain free from infection Outcome: Progressing Goal: Diagnostic test results will improve Outcome: Progressing Goal: Respiratory complications will improve Outcome: Progressing Goal: Cardiovascular complication will be avoided Outcome: Progressing   Problem: Safety: Goal: Periods of time without injury will increase Outcome: Progressing   Problem: Physical Regulation: Goal: Ability to maintain clinical measurements within normal limits will improve Outcome: Progressing   Problem: Health Behavior/Discharge Planning: Goal: Identification of resources available to assist in meeting health care needs will improve Outcome: Progressing Goal: Compliance with treatment plan for underlying cause of condition will improve Outcome: Progressing   Problem: Coping: Goal: Ability to verbalize frustrations and anger appropriately will improve Outcome: Progressing Goal: Ability to demonstrate self-control will improve Outcome: Progressing   

## 2022-01-06 NOTE — BHH Group Notes (Signed)
B[facility] Group Notes:  (Nursing/MHT/Case Management/Adjunct)  Date:  01/06/2022  Time:  9:43 AM  Type of Therapy:   community meeting  Participation Level:  Active  Participation Quality:  Appropriate  Affect:  Appropriate  Cognitive:  Appropriate  Insight:  Appropriate  Engagement in Group:  Engaged  Modes of Intervention:  Education  Summary of Progress/Problems:  Rodena Goldmann 01/06/2022, 9:43 AM

## 2022-01-06 NOTE — Group Note (Signed)
[facility] LCSW Group Therapy Note   Group Date: 01/06/2022 Start Time: 1300 End Time: 1400  Type of Therapy/Topic:  Group Therapy:  Feelings about Diagnosis  Participation Level:  Active    Description of Group:    This group will allow patients to explore their thoughts and feelings about diagnoses they have received. Patients will be guided to explore their level of understanding and acceptance of these diagnoses. Facilitator will encourage patients to process their thoughts and feelings about the reactions of others to their diagnosis, and will guide patients in identifying ways to discuss their diagnosis with significant others in their lives. This group will be process-oriented, with patients participating in exploration of their own experiences as well as giving and receiving support and challenge from other group members.   Therapeutic Goals: 1. Patient will demonstrate understanding of diagnosis as evidence by identifying two or more symptoms of the disorder:  2. Patient will be able to express two feelings regarding the diagnosis 3. Patient will demonstrate ability to communicate their needs through discussion and/or role plays  Summary of Patient Progress: Patient was present for the entirety of the group process. He was actively involved in the discussion. Pt defined diagnosis as "what you are suffering from/with". He stated that he disagrees with his diagnosis particularly when he was told that he has bipolar disorder. He was appropriate during group and appeared open and receptive to feedback/comments from both peers and facilitator.   Therapeutic Modalities:   Cognitive Behavioral Therapy Brief Therapy Feelings Identification    Glenis Smoker, LCSW

## 2022-01-06 NOTE — Progress Notes (Signed)
Recreation Therapy Notes   Date: 01/06/2022   Time: 10:30 am    Location: Courtyard    Behavioral response: Appropriate   Intervention Topic: Wellness     Discussion/Intervention:  Group content today was focused on Wellness. The group defined wellness and some positive ways they make decisions for themselves. Individuals expressed reasons why they neglected any wellness in the past. Patients described ways to improve wellness skills in the future. The group explained what could happen if they did not do any wellness at all. Participants express how bad choices has affected them and others around them. Individual explained the importance of wellness. The group participated in the intervention "Testing my Wellness" where they had a chance to identify some of their weaknesses and strengths in wellness.  Clinical Observations/Feedback: Patient came to group late and was able to explore and identify wellness activities that they can use outside of the hospital. Individual was social with peers and staff while participating in the intervention.   Jafeth Mustin LRT/CTRS             Damarrion Mimbs 01/06/2022 12:32 PM

## 2022-01-06 NOTE — [facility] Counselor (Addendum)
CSW contacted [facility] [phone]) regarding application. CSW was informed that he would need to speak with the intake coordinator and was given email to make contact with them ([email or web address]). No other concerns expressed. Contact ended without incident.   CSW sent secure email to inquire about application.   CSW spoke with Belenda Cruise of [facility]. CSW was asked to fax his H&P to [phone]. CSW agreed. Pt was scheduled for admission there on tomorrow, 01/07/22 at 9AM if nothing else happens. No other concerns expressed. Contact ended without incident.   CSW faxed H&P to [facility].   CSW received call from Potter D of [facility]. She explained that she had not received pt's application and that she had been working from home the past couple of days. Paul Dykes requested that application be emailed to her. CSW agreed. No other concerns expressed. Contact ended without incident.   CSW will continue to follow.   Vilma Meckel. Algis Greenhouse, MSW, LCSW, LCAS 01/06/2022 10:24 AM  Demographic information securely emailed to [email or web address]. Application to be sent later in the day.  Vilma Meckel. Algis Greenhouse, MSW, LCSW, LCAS 01/06/2022 10:28 AM  Application, med list, H&P, and PSA securely emailed to [email or web address]. CSW will continue to follow.   Vilma Meckel. Algis Greenhouse, MSW, LCSW, LCAS 01/06/2022 11:32 AM

## 2022-01-06 NOTE — Progress Notes (Signed)
[facility] MD Progress Note  01/06/2022 4:41 PM Nathan Koch  MRN:  [id number] Subjective: Follow-up 31 year old man with depression and substance abuse.  No current suicidal thoughts.  Pleased to be going to day [employer].  Physically stable. Principal Problem: Severe recurrent major depression without psychotic features ([name]) Diagnosis: Principal Problem:   Severe recurrent major depression without psychotic features ([name]) Active Problems:   Asthma   Methamphetamine use disorder, severe ([name])   Self-inflicted laceration of left wrist ([name])  Total Time spent with patient: 30 minutes  Past Psychiatric History: Past history of depression and suicide attempts and self-injury and ongoing substance use problems  Past Medical History:  Past Medical History:  Diagnosis Date   Anxiety    Anxiety disorder    Asthma    Bipolar 1 disorder ([name])    Depression    Methamphetamine abuse ([name])    PTSD (post-traumatic stress disorder)     Past Surgical History:  Procedure Laterality Date   CLOSED REDUCTION MANDIBLE N/A 07/02/2018   Procedure: CLOSED REDUCTION MANDIBULAR;  Surgeon: Vernie Murders, MD;  Location: [facility] ORS;  Service: ENT;  Laterality: N/A;   Family History:  Family History  Problem Relation Age of Onset   Asthma Mother    Cancer Mother        breast cancer   Ulcers Mother    Cancer Father        esophogeal cancer   Hypertension Father    Asthma Brother    Family Psychiatric  History: See previous Social History:  Social History   Substance and Sexual Activity  Alcohol Use Not Currently   Alcohol/week: 13.0 standard drinks of alcohol   Types: 6 Cans of beer, 7 Standard drinks or equivalent per week   Comment: previously heavy drinker 2016. most recent 4 beer/ day drinker and none since 01-11-2020     Social History   Substance and Sexual Activity  Drug Use Yes   Types: Methamphetamines   Comment: states he uses 1 g every other day    Social History    Socioeconomic History   Marital status: Single    Spouse name: Not on file   Number of children: 1   Years of education: 10   Highest education level: 10th grade  Occupational History   Not on file  Tobacco Use   Smoking status: Every Day    Packs/day: 0.50    Years: 8.00    Total pack years: 4.00    Types: Cigarettes   Smokeless tobacco: Never  Vaping Use   Vaping Use: Every day   Substances: Nicotine, Flavoring  Substance and Sexual Activity   Alcohol use: Not Currently    Alcohol/week: 13.0 standard drinks of alcohol    Types: 6 Cans of beer, 7 Standard drinks or equivalent per week    Comment: previously heavy drinker 2016. most recent 4 beer/ day drinker and none since 01-11-2020   Drug use: Yes    Types: Methamphetamines    Comment: states he uses 1 g every other day   Sexual activity: Not on file  Other Topics Concern   Not on file  Social History Narrative   Not on file   Social Determinants of Health   Financial Resource Strain: Not on file  Food Insecurity: Food Insecurity Present (12/30/2021)   Hunger Vital Sign    Worried About Running Out of Food in the Last Year: Sometimes true    Ran Out of Food in the Last  Year: Sometimes true  Transportation Needs: Unmet Transportation Needs (12/30/2021)   PRAPARE - Administrator, Civil Service (Medical): Yes    Lack of Transportation (Non-Medical): Yes  Physical Activity: Not on file  Stress: Not on file  Social Connections: Not on file   Additional Social History:                         Sleep: Fair  Appetite:  Fair  Current Medications: Current Facility-Administered Medications  Medication Dose Route Frequency Provider Last Rate Last Admin   acetaminophen (TYLENOL) tablet 650 mg  650 mg Oral Q6H PRN Marenda Accardi, Jackquline Denmark, MD   650 mg at 01/05/22 2107   albuterol (VENTOLIN HFA) 108 (90 Base) MCG/ACT inhaler 2 puff  2 puff Inhalation Q6H PRN Seymour Pavlak, Jackquline Denmark, MD   2 puff at 01/06/22 1351    alum & mag hydroxide-simeth (MAALOX/MYLANTA) 200-200-20 MG/5ML suspension 30 mL  30 mL Oral Q4H PRN Elica Almas, Jackquline Denmark, MD       hydrocortisone cream 0.5 %   Topical BID Bransyn Adami, Jackquline Denmark, MD   Given at 12/31/21 1712   hydrOXYzine (ATARAX) tablet 50 mg  50 mg Oral Q6H PRN Levaughn Puccinelli T, MD   50 mg at 01/04/22 2112   lamoTRIgine (LAMICTAL) tablet 50 mg  50 mg Oral Daily Malik, Kashif   50 mg at 01/06/22 0741   magnesium hydroxide (MILK OF MAGNESIA) suspension 30 mL  30 mL Oral Daily PRN Ivor Kishi, Jackquline Denmark, MD       nicotine (NICODERM CQ - dosed in mg/24 hours) patch 14 mg  14 mg Transdermal Daily Toia Micale T, MD       QUEtiapine (SEROQUEL) tablet 150 mg  150 mg Oral QHS Malik, Kashif   150 mg at 01/05/22 2105    Lab Results: No results found for this or any previous visit (from the past 48 hour(s)).  Blood Alcohol level:  Lab Results  Component Value Date   ETH <10 12/29/2021   ETH <10 11/30/2021    Metabolic Disorder Labs: Lab Results  Component Value Date   HGBA1C 6.0 (H) 01/17/2021   MPG 126 01/17/2021   MPG 126 10/08/2020   No results found for: "PROLACTIN" Lab Results  Component Value Date   CHOL 94 01/17/2021   TRIG 100 01/17/2021   HDL 28 (L) 01/17/2021   CHOLHDL 3.4 01/17/2021   VLDL 20 01/17/2021   LDLCALC 46 01/17/2021   LDLCALC 49 10/08/2020    Physical Findings: AIMS:  , ,  ,  ,    CIWA:    COWS:     Musculoskeletal: Strength & Muscle Tone: within normal limits Gait & Station: normal Patient leans: N/A  Psychiatric Specialty Exam:  Presentation  General Appearance: Appropriate for Environment; Casual; Neat  Eye Contact:Fair  Speech:Normal Rate  Speech Volume:Decreased  Handedness:Right   Mood and Affect  Mood:Anxious; Depressed  Affect:Appropriate; Full Range   Thought Process  Thought Processes:Coherent  Descriptions of Associations:Intact  Orientation:Full (Time, Place and Person)  Thought Content:Logical  History of  Schizophrenia/Schizoaffective disorder:No  Duration of Psychotic Symptoms:N/A  Hallucinations:No data recorded Ideas of Reference:None  Suicidal Thoughts:No data recorded Homicidal Thoughts:No data recorded  Sensorium  Memory:Immediate Good; Recent Good  Judgment:Fair  Insight:Fair   Executive Functions  Concentration:Fair  Attention Span:Fair  Recall:Fair  Fund of Knowledge:Fair  Language:Fair   Psychomotor Activity  Psychomotor Activity:No data recorded  Assets  Assets:Communication Skills; Desire for  Improvement; Housing; Social Support   Sleep  Sleep:No data recorded   Physical Exam: Physical Exam Vitals and nursing note reviewed.  Constitutional:      Appearance: Normal appearance.  HENT:     Head: Normocephalic and atraumatic.     Mouth/Throat:     Pharynx: Oropharynx is clear.  Eyes:     Pupils: Pupils are equal, round, and reactive to light.  Cardiovascular:     Rate and Rhythm: Normal rate and regular rhythm.  Pulmonary:     Effort: Pulmonary effort is normal.     Breath sounds: Normal breath sounds.  Abdominal:     General: Abdomen is flat.     Palpations: Abdomen is soft.  Musculoskeletal:        General: Normal range of motion.  Skin:    General: Skin is warm and dry.  Neurological:     General: No focal deficit present.     Mental Status: He is alert. Mental status is at baseline.  Psychiatric:        Attention and Perception: Attention normal.        Mood and Affect: Mood normal.        Speech: Speech normal.        Behavior: Behavior normal.        Thought Content: Thought content normal.        Cognition and Memory: Cognition normal.        Judgment: Judgment normal.    Review of Systems  Constitutional: Negative.   HENT: Negative.    Eyes: Negative.   Respiratory: Negative.    Cardiovascular: Negative.   Gastrointestinal: Negative.   Musculoskeletal: Negative.   Skin: Negative.   Neurological: Negative.    Psychiatric/Behavioral: Negative.     Blood pressure 117/76, pulse 70, temperature 97.8 F (36.6 C), temperature source Oral, resp. rate 20, height 5\' 8"  (1.727 m), weight 108.9 kg, SpO2 99 %. Body mass index is 36.49 kg/m.   Treatment Plan Summary: Medication management and Plan no change to medication.  Ordering a 30-day supply for discharge.  Anticipate discharge to day [facility]  [facility], MD 01/06/2022, 4:41 PM

## 2022-01-06 NOTE — Plan of Care (Signed)
D: Patient alert and oriented. Patient denies pain. Patient rates anxiety and depression 6/10. Patient denies SI/HI/AVH.  Patient has been interactive with peers and staff. Patient has been in the dayroom watching television with peers on the unit.  A: Scheduled medications administered to patient, per MD orders.  Support and encouragement provided to patient.  Q15 minute safety checks maintained.   R: Patient compliant with medication administration and treatment plan. No adverse drug reactions noted. Patient remains safe on the unit at this time.  Problem: Education: Goal: Knowledge of  General Education information/materials will improve Outcome: Progressing Goal: Verbalization of understanding the information provided will improve Outcome: Progressing   Problem: Health Behavior/Discharge Planning: Goal: Compliance with treatment plan for underlying cause of condition will improve Outcome: Progressing   Problem: Physical Regulation: Goal: Ability to maintain clinical measurements within normal limits will improve Outcome: Progressing

## 2022-01-06 NOTE — Progress Notes (Signed)
Patient pleasant and cooperative. Medication compliant. Appropriate with staff and peers. Endorses passive SI, with no intent. Verbally contracts for safety. Endorses depression and anxiety. Complaints of pain to hip, prn given with good relief.  Encouragement and support provided, safety checks maintained, medications given as prescribed. Pt receptive and remains safe on unit with q 15 min checks.

## 2022-01-06 NOTE — Progress Notes (Signed)
Patient is active on the unit spending a great deal of time in the dayroom with his peers.  He is med compliant and received his meds without incident. He denies si  hi  avh  does endorse depression and high anxiety related to his discharge tomorrow to [facility]. Support and encouragement offered.  Will continue to monitor with q15 minute safety checks.   C Butler-Nicholson, LPN

## 2022-01-07 ENCOUNTER — Other Ambulatory Visit: Payer: Self-pay

## 2022-01-07 DIAGNOSIS — F332 Major depressive disorder, recurrent severe without psychotic features: Secondary | ICD-10-CM | POA: Diagnosis not present

## 2022-01-07 MED ORDER — HYDROXYZINE HCL 50 MG PO TABS: 50.0000 mg | ORAL_TABLET | Freq: Four times a day (QID) | ORAL | 1 refills | Status: DC | PRN

## 2022-01-07 MED ORDER — ALBUTEROL SULFATE HFA 108 (90 BASE) MCG/ACT IN AERS: 2.0000 | INHALATION_SPRAY | Freq: Four times a day (QID) | RESPIRATORY_TRACT | 1 refills | Status: DC | PRN

## 2022-01-07 MED ORDER — NICOTINE 14 MG/24HR TD PT24
14.0000 mg | MEDICATED_PATCH | Freq: Every day | TRANSDERMAL | 1 refills | Status: DC
Start: 2022-01-07 — End: 2023-01-17

## 2022-01-07 MED ORDER — QUETIAPINE FUMARATE 50 MG PO TABS: 150.0000 mg | ORAL_TABLET | Freq: Every day | ORAL | 1 refills | Status: DC

## 2022-01-07 MED ORDER — LAMOTRIGINE 25 MG PO TABS: 50.0000 mg | ORAL_TABLET | Freq: Every day | ORAL | 1 refills | Status: DC

## 2022-01-07 MED ORDER — HYDROCORTISONE 0.5 % EX CREA: TOPICAL_CREAM | Freq: Two times a day (BID) | CUTANEOUS | 1 refills | Status: DC

## 2022-01-07 NOTE — Progress Notes (Signed)
Recreation Therapy Notes  INPATIENT RECREATION TR PLAN  Patient Details Name: Nathan Koch MRN: [id number] DOB: [birth year] Today's Date: 01/07/2022  Rec Therapy Plan Is patient appropriate for Therapeutic Recreation?: Yes Treatment times per week: at least 3 Estimated Length of Stay: 5-7 days TR Treatment/Interventions: Group participation (Comment)  Discharge Criteria Pt will be discharged from therapy if:: Discharged Treatment plan/goals/alternatives discussed and agreed upon by:: Patient/family  Discharge Summary Short term goals set: Patient will identify 3 resources in their community to aid in recovery within 5 recreation therapy group sessions Short term goals met: Complete Progress toward goals comments: Groups attended Which groups?: Stress management, Wellness, Other (Comment) (Problem-Solving, Self-care) Reason goals not met: N/A Therapeutic equipment acquired: N/A Reason patient discharged from therapy: Discharge from hospital Pt/family agrees with progress & goals achieved: Yes Date patient discharged from therapy: 01/07/22   Felina Tello 01/07/2022, 12:12 PM

## 2022-01-07 NOTE — Discharge Summary (Signed)
Physician Discharge Summary Note  Patient:  Nathan Koch is an 31 y.o., male MRN:  [id number] DOB:  1990-09-29 Patient phone:  [phone] (home)  Patient address:   [address] [place] [id number],  Total Time spent with patient: 30 minutes  Date of Admission:  12/30/2021 Date of Discharge: 01/07/2022  Reason for Admission: Patient was admitted after suicide attempt by self-inflicted laceration of his arm.  Depressive symptoms and relapse into substance use.  Feeling hopeless.  Principal Problem: Severe recurrent major depression without psychotic features [facility]) Discharge Diagnoses: Principal Problem:   Severe recurrent major depression without psychotic features ([place]) Active Problems:   Asthma   Methamphetamine use disorder, severe ([name])   Self-inflicted laceration of left wrist ([name])   Past Psychiatric History: Past history of recurrent suicide attempts and symptoms of depression often associated with when he has relapsed into drug use and is feeling hopeless  Past Medical History:  Past Medical History:  Diagnosis Date   Anxiety    Anxiety disorder    Asthma    Bipolar 1 disorder ([place])    Depression    Methamphetamine abuse ([place])    PTSD (post-traumatic stress disorder)     Past Surgical History:  Procedure Laterality Date   CLOSED REDUCTION MANDIBLE N/A 07/02/2018   Procedure: CLOSED REDUCTION MANDIBULAR;  Surgeon: Margaretha Sheffield, MD;  Location: [facility] ORS;  Service: ENT;  Laterality: N/A;   Family History:  Family History  Problem Relation Age of Onset   Asthma Mother    Cancer Mother        breast cancer   Ulcers Mother    Cancer Father        esophogeal cancer   Hypertension Father    Asthma Brother    Family Psychiatric  History: See previous Social History:  Social History   Substance and Sexual Activity  Alcohol Use Not Currently   Alcohol/week: 13.0 standard drinks of alcohol   Types: 6 Cans of beer, 7 Standard drinks or equivalent per week    Comment: previously heavy drinker 2016. most recent 4 beer/ day drinker and none since 01-11-2020     Social History   Substance and Sexual Activity  Drug Use Yes   Types: Methamphetamines   Comment: states he uses 1 g every other day    Social History   Socioeconomic History   Marital status: Single    Spouse name: Not on file   Number of children: 1   Years of education: 10   Highest education level: 10th grade  Occupational History   Not on file  Tobacco Use   Smoking status: Every Day    Packs/day: 0.50    Years: 8.00    Total pack years: 4.00    Types: Cigarettes   Smokeless tobacco: Never  Vaping Use   Vaping Use: Every day   Substances: Nicotine, Flavoring  Substance and Sexual Activity   Alcohol use: Not Currently    Alcohol/week: 13.0 standard drinks of alcohol    Types: 6 Cans of beer, 7 Standard drinks or equivalent per week    Comment: previously heavy drinker 2016. most recent 4 beer/ day drinker and none since 01-11-2020   Drug use: Yes    Types: Methamphetamines    Comment: states he uses 1 g every other day   Sexual activity: Not on file  Other Topics Concern   Not on file  Social History Narrative   Not on file   Social Determinants of  Health   Financial Resource Strain: Not on file  Food Insecurity: Food Insecurity Present (12/30/2021)   Hunger Vital Sign    Worried About Running Out of Food in the Last Year: Sometimes true    Ran Out of Food in the Last Year: Sometimes true  Transportation Needs: Unmet Transportation Needs (12/30/2021)   PRAPARE - Administrator, Civil Service (Medical): Yes    Lack of Transportation (Non-Medical): Yes  Physical Activity: Not on file  Stress: Not on file  Social Connections: Not on file    Hospital Course: Admitted to psychiatric unit.  15-minute checks maintained.  Patient was cooperative with admission and treatment and showed no dangerous aggressive or suicidal behavior.  He was pleasant and  interactive with staff and patients and attended groups and individual counseling and worked well with the treatment team.  Patient denied any suicidal ideation at the time of discharge and was tolerating medicine and feeling more upbeat.  Arrangements had been made for acceptance to day [facility] recovery services in [facility] which is where he will be going at discharge today.  Supply of medicine provided.  Physical Findings: AIMS: Facial and Oral Movements Muscles of Facial Expression: None, normal Lips and Perioral Area: None, normal Jaw: None, normal Tongue: None, normal,Extremity Movements Upper (arms, wrists, hands, fingers): None, normal Lower (legs, knees, ankles, toes): None, normal, Trunk Movements Neck, shoulders, hips: None, normal, Overall Severity Severity of abnormal movements (highest score from questions above): None, normal Incapacitation due to abnormal movements: None, normal Patient's awareness of abnormal movements (rate only patient's report): No Awareness, Dental Status Current problems with teeth and/or dentures?: No Does patient usually wear dentures?: No  CIWA:    COWS:     Musculoskeletal: Strength & Muscle Tone: within normal limits Gait & Station: normal Patient leans: N/A   Psychiatric Specialty Exam:  Presentation  General Appearance: Appropriate for Environment; Casual; Neat  Eye Contact:Fair  Speech:Normal Rate  Speech Volume:Decreased  Handedness:Right   Mood and Affect  Mood:Anxious; Depressed  Affect:Appropriate; Full Range   Thought Process  Thought Processes:Coherent  Descriptions of Associations:Intact  Orientation:Full (Time, Place and Person)  Thought Content:Logical  History of Schizophrenia/Schizoaffective disorder:No  Duration of Psychotic Symptoms:N/A  Hallucinations:No data recorded Ideas of Reference:None  Suicidal Thoughts:No data recorded Homicidal Thoughts:No data recorded  Sensorium  Memory:Immediate  Good; Recent Good  Judgment:Fair  Insight:Fair   Executive Functions  Concentration:Fair  Attention Span:Fair  Recall:Fair  Fund of Knowledge:Fair  Language:Fair   Psychomotor Activity  Psychomotor Activity:No data recorded  Assets  Assets:Communication Skills; Desire for Improvement; Housing; Social Support   Sleep  Sleep:No data recorded   Physical Exam: Physical Exam Vitals and nursing note reviewed.  Constitutional:      Appearance: Normal appearance.  HENT:     Head: Normocephalic and atraumatic.     Mouth/Throat:     Pharynx: Oropharynx is clear.  Eyes:     Pupils: Pupils are equal, round, and reactive to light.  Cardiovascular:     Rate and Rhythm: Normal rate and regular rhythm.  Pulmonary:     Effort: Pulmonary effort is normal.     Breath sounds: Normal breath sounds.  Abdominal:     General: Abdomen is flat.     Palpations: Abdomen is soft.  Musculoskeletal:        General: Normal range of motion.  Skin:    General: Skin is warm and dry.  Neurological:     General: No focal  deficit present.     Mental Status: He is alert. Mental status is at baseline.  Psychiatric:        Mood and Affect: Mood normal.        Thought Content: Thought content normal.    Review of Systems  Constitutional: Negative.   HENT: Negative.    Eyes: Negative.   Respiratory: Negative.    Cardiovascular: Negative.   Gastrointestinal: Negative.   Musculoskeletal: Negative.   Skin: Negative.   Neurological: Negative.   Psychiatric/Behavioral: Negative.     Blood pressure 120/67, pulse 68, temperature 97.7 F (36.5 C), temperature source Oral, resp. rate 20, height 5\' 8"  (1.727 m), weight 108.9 kg, SpO2 98 %. Body mass index is 36.49 kg/m.   Social History   Tobacco Use  Smoking Status Every Day   Packs/day: 0.50   Years: 8.00   Total pack years: 4.00   Types: Cigarettes  Smokeless Tobacco Never   Tobacco Cessation:  A prescription for an FDA-approved  tobacco cessation medication provided at discharge   Blood Alcohol level:  Lab Results  Component Value Date   ETH <10 12/29/2021   ETH <10 11/30/2021    Metabolic Disorder Labs:  Lab Results  Component Value Date   HGBA1C 6.0 (H) 01/17/2021   MPG 126 01/17/2021   MPG 126 10/08/2020   No results found for: "PROLACTIN" Lab Results  Component Value Date   CHOL 94 01/17/2021   TRIG 100 01/17/2021   HDL 28 (L) 01/17/2021   CHOLHDL 3.4 01/17/2021   VLDL 20 01/17/2021   LDLCALC 46 01/17/2021   LDLCALC 49 10/08/2020    See Psychiatric Specialty Exam and Suicide Risk Assessment completed by Attending Physician prior to discharge.  Discharge destination:  Home  Is patient on multiple antipsychotic therapies at discharge:  No   Has Patient had three or more failed trials of antipsychotic monotherapy by history:  No  Recommended Plan for Multiple Antipsychotic Therapies: NA  Discharge Instructions     Diet - low sodium heart healthy   Complete by: As directed    Increase activity slowly   Complete by: As directed       Allergies as of 01/07/2022       Reactions   Latex Hives        Medication List     STOP taking these medications    traZODone 100 MG tablet Commonly known as: DESYREL       TAKE these medications      Indication  albuterol 108 (90 Base) MCG/ACT inhaler Commonly known as: VENTOLIN HFA Inhale 2 puffs into the lungs every 6 (six) hours as needed for shortness of breath.  Indication: Asthma   hydrocortisone cream 0.5 % Apply topically 2 (two) times daily.  Indication: Skin Disease Successfully Treated with Steroid Therapy   hydrOXYzine 50 MG tablet Commonly known as: ATARAX Take 1 tablet (50 mg total) by mouth every 6 (six) hours as needed for anxiety. What changed: when to take this  Indication: Feeling Anxious   lamoTRIgine 25 MG tablet Commonly known as: LAMICTAL Take 2 tablets (50 mg total) by mouth daily. What changed: how  much to take  Indication: Manic-Depression   nicotine 14 mg/24hr patch Commonly known as: NICODERM CQ - dosed in mg/24 hours Place 1 patch (14 mg total) onto the skin daily.  Indication: Nicotine Addiction   QUEtiapine 50 MG tablet Commonly known as: SEROQUEL Take 3 tablets (150 mg total) by mouth at bedtime. What  changed:  medication strength how much to take  Indication: Major Depressive Disorder        Follow-up Information     [facility] Follow up.   Why: You have an intake appointment today at [place]. Thanks! Contact information: [place] [address] [phone]                 Follow-up recommendations:  Other:  Follow-up with outpatient treatment after rehabilitation services and [facility].  Comments: Supply and prescriptions provided  Signed: Alethia Berthold, MD 01/07/2022, 10:28 AM

## 2022-01-07 NOTE — Progress Notes (Signed)
Recreation Therapy Notes  Date: 01/07/2022   Time: 10:30 am    Location: Craft room     Behavioral response: Appropriate   Intervention Topic: Stress Management   Discussion/Intervention:  Group content on today was focused on stress. The group defined stress and way to cope with stress. Participants expressed how they know when they are stresses out. Individuals described the different ways they have to cope with stress. The group stated reasons why it is important to cope with stress. Patient explained what good stress is and some examples. The group participated in the intervention "Stress Management". Individuals were able to brainstorm new ways to manage their stress.  Clinical Observations/Feedback: Patient came to group and expressed that he manages stress by getting out doing activities and going to the [facility]. ndividual was social with peers and staff while participating in the intervention.   Kavi Almquist LRT/CTRS             Michole Lecuyer 01/07/2022 12:01 PM

## 2022-01-07 NOTE — Progress Notes (Signed)
  [facility] Adult Case Management Discharge Plan :  Will you be returning to the same living situation after discharge:  No. At discharge, do you have transportation home?: Yes,  CSW will make transportation arrangements. Do you have the ability to pay for your medications: No.  Release of information consent forms completed and in the chart;  Patient's signature needed at discharge.  Patient to Follow up at:  Follow-up Information     [facility] Follow up.   Why: You have an intake appointment today at 9am. Thanks! Contact information: [address] [place] [address] [phone]                 Next level of care provider has access to [facility] Link:no  Safety Planning and Suicide Prevention discussed: Yes,  SPE completed with pt.     Has patient been referred to the Quitline?: Patient refused referral  Patient has been referred for addiction treatment: Yes  Glenis Smoker, LCSW 01/07/2022, 9:19 AM

## 2022-01-07 NOTE — Plan of Care (Signed)
  Problem: Substance Use/Abuse Goal: STG - Patient will identify 3 resources in their community to aid in recovery within 5 recreation therapy group sessions Description: STG - Patient will identify 3 resources in their community to aid in recovery within 5 recreation therapy group sessions Outcome: Completed/Met   

## 2022-01-07 NOTE — Progress Notes (Signed)
Discharge note: RN met with pt and reviewed pt's discharge instructions. Pt verbalized understanding of discharge instructions and pt did not have any questions. RN reviewed and provided pt with a copy of SRA, AVS and Transition Record. RN returned pt's belongings to pt.  Prescriptions and samples were given to pt. Pt denied SI/HI/AVH and voiced no concerns. Pt was appreciative of the care pt received at [facility]. Patient discharged to the lobby without incident.    Problem: Health Behavior/Discharge Planning: Goal: Ability to manage health-related needs will improve Outcome: Adequate for Discharge   Problem: Coping: Goal: Level of anxiety will decrease Outcome: Adequate for Discharge      01/07/22 0753  Psych Admission Type (Psych Patients Only)  Admission Status Voluntary  Psychosocial Assessment  Patient Complaints None  Eye Contact Fair  Facial Expression Other (Comment) (WDL)  Affect Appropriate to circumstance  Speech Logical/coherent  Interaction Assertive  Motor Activity Other (Comment) (WDL)  Appearance/Hygiene Unremarkable  Behavior Characteristics Cooperative;Appropriate to situation;Calm  Mood Pleasant  Thought Process  Coherency WDL  Content WDL  Delusions None reported or observed  Perception WDL  Hallucination None reported or observed  Judgment WDL  Confusion None  Danger to Self  Current suicidal ideation? Denies  Danger to Others  Danger to Others None reported or observed

## 2022-01-07 NOTE — BHH Suicide Risk Assessment (Signed)
[facility] Discharge Suicide Risk Assessment   Principal Problem: Severe recurrent major depression without psychotic features [facility]) Discharge Diagnoses: Principal Problem:   Severe recurrent major depression without psychotic features ([name]) Active Problems:   Asthma   Methamphetamine use disorder, severe ([name])   Self-inflicted laceration of left wrist ([name])   Total Time spent with patient: 20 minutes  Musculoskeletal: Strength & Muscle Tone: within normal limits Gait & Station: normal Patient leans: N/A  Psychiatric Specialty Exam  Presentation  General Appearance: Appropriate for Environment; Casual; Neat  Eye Contact:Fair  Speech:Normal Rate  Speech Volume:Decreased  Handedness:Right   Mood and Affect  Mood:Anxious; Depressed  Duration of Depression Symptoms: Less than two weeks  Affect:Appropriate; Full Range   Thought Process  Thought Processes:Coherent  Descriptions of Associations:Intact  Orientation:Full (Time, Place and Person)  Thought Content:Logical  History of Schizophrenia/Schizoaffective disorder:No  Duration of Psychotic Symptoms:N/A  Hallucinations:No data recorded Ideas of Reference:None  Suicidal Thoughts:No data recorded Homicidal Thoughts:No data recorded  Sensorium  Memory:Immediate Good; Recent Good  Judgment:Fair  Insight:Fair   Executive Functions  Concentration:Fair  Attention Span:Fair  Recall:Fair  Fund of Knowledge:Fair  Language:Fair   Psychomotor Activity  Psychomotor Activity:No data recorded  Assets  Assets:Communication Skills; Desire for Improvement; Housing; Social Support   Sleep  Sleep:No data recorded  Physical Exam: Physical Exam Vitals reviewed.  Constitutional:      Appearance: Normal appearance.  HENT:     Head: Normocephalic and atraumatic.     Mouth/Throat:     Pharynx: Oropharynx is clear.  Eyes:     Pupils: Pupils are equal, round, and reactive to light.  Cardiovascular:      Rate and Rhythm: Normal rate and regular rhythm.  Pulmonary:     Effort: Pulmonary effort is normal.     Breath sounds: Normal breath sounds.  Abdominal:     General: Abdomen is flat.     Palpations: Abdomen is soft.  Musculoskeletal:        General: Normal range of motion.  Skin:    General: Skin is warm and dry.  Neurological:     General: No focal deficit present.     Mental Status: He is alert. Mental status is at baseline.  Psychiatric:        Mood and Affect: Mood normal.        Thought Content: Thought content normal.    Review of Systems  Constitutional: Negative.   HENT: Negative.    Eyes: Negative.   Respiratory: Negative.    Cardiovascular: Negative.   Gastrointestinal: Negative.   Musculoskeletal: Negative.   Skin: Negative.   Neurological: Negative.   Psychiatric/Behavioral: Negative.    All other systems reviewed and are negative.  Blood pressure 120/67, pulse 68, temperature 97.7 F (36.5 C), temperature source Oral, resp. rate 20, height 5\' 8"  (1.727 m), weight 108.9 kg, SpO2 98 %. Body mass index is 36.49 kg/m.  Mental Status Per Nursing Assessment::   On Admission:  Suicidal ideation indicated by patient  Demographic Factors:  Low socioeconomic status  Loss Factors: Legal issues and Financial problems/change in socioeconomic status  Historical Factors: Impulsivity  Risk Reduction Factors:   Positive therapeutic relationship  Continued Clinical Symptoms:  Depression:   Comorbid alcohol abuse/dependence Alcohol/Substance Abuse/Dependencies  Cognitive Features That Contribute To Risk:  None    Suicide Risk:  Minimal: No identifiable suicidal ideation.  Patients presenting with no risk factors but with morbid ruminations; may be classified as minimal risk based on the severity of  the depressive symptoms   Follow-up Information     [facility] Follow up.   Why: You have an intake appointment today at 9am. Thanks! Contact  information: [address] [place] [address] [phone]                 Plan Of Care/Follow-up recommendations:  Other:  [facility]  Mordecai Rasmussen, MD 01/07/2022, 8:53 AM

## 2022-07-21 IMAGING — CT CT HEAD W/O CM
4 series · 17 of 47 positions shown, 19 images · non-contrast
Comparison: 07/02/2018 CT

CLINICAL DATA: 30-year-old male with altered mental status.

EXAM:
CT HEAD WITHOUT CONTRAST
TECHNIQUE: Contiguous axial images were obtained from the base of the skull
through the vertex without intravenous contrast.

[Series 2: head bone · axial · 0.43mm/px · z∈[-199,-143]mm · 4 of 80 slices shown]
[im 8/80  bone]
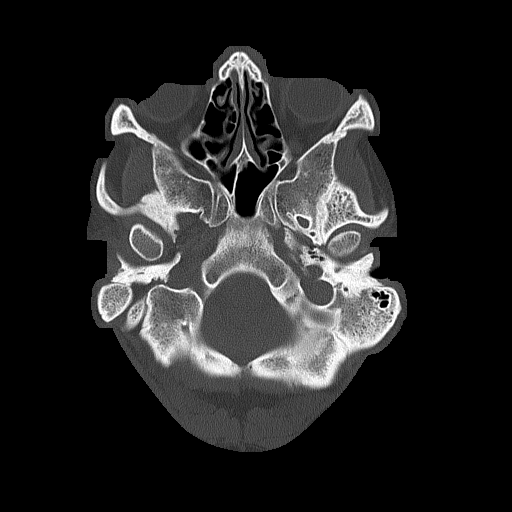
[im 16/80  bone]
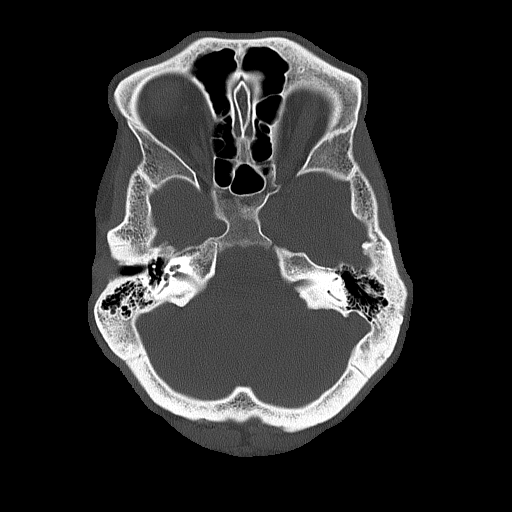
[im 24/80  bone]
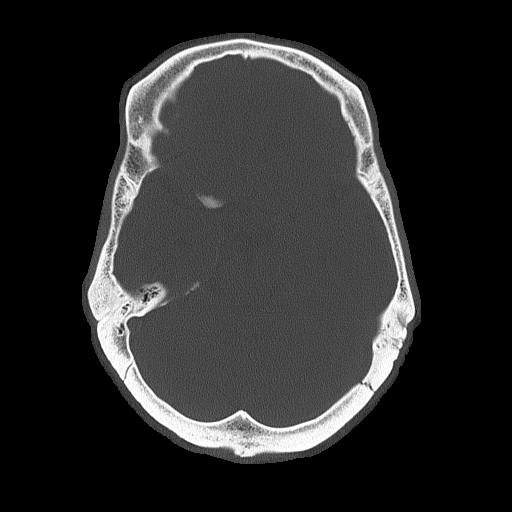
[im 36/80  bone]
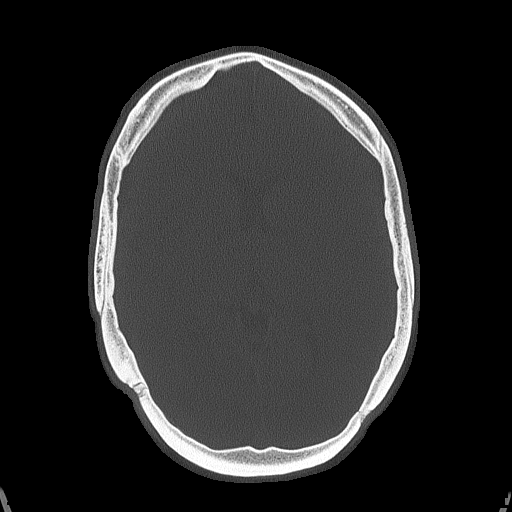

[Series 3: head wo · axial · 0.43mm/px · z∈[-198,-78]mm · 7 of 32 slices shown, 9 images]
[im 4/32  brain]
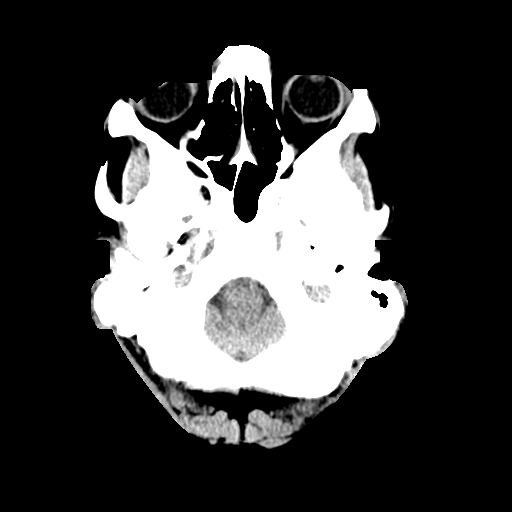
[im 4/32  bone]
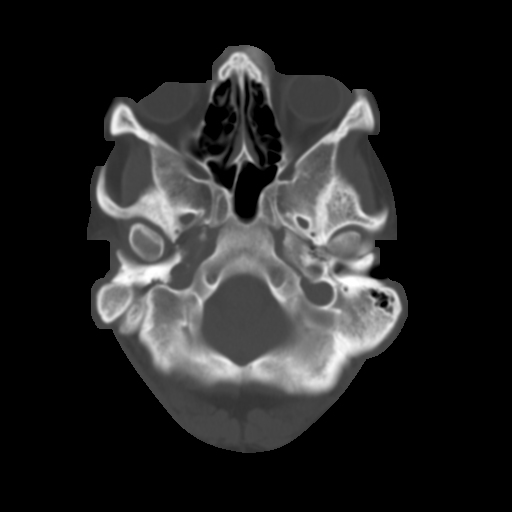
[im 8/32  brain]
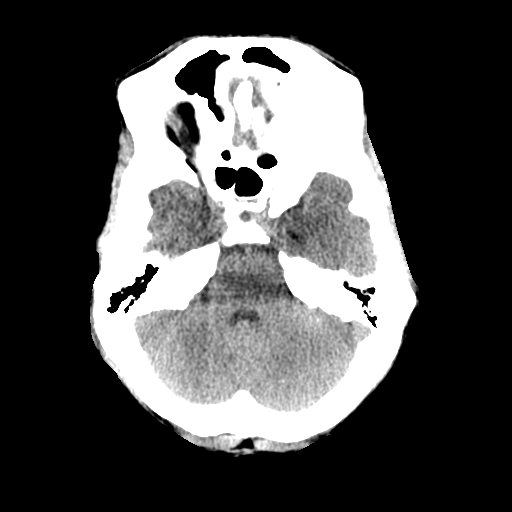
[im 12/32  brain]
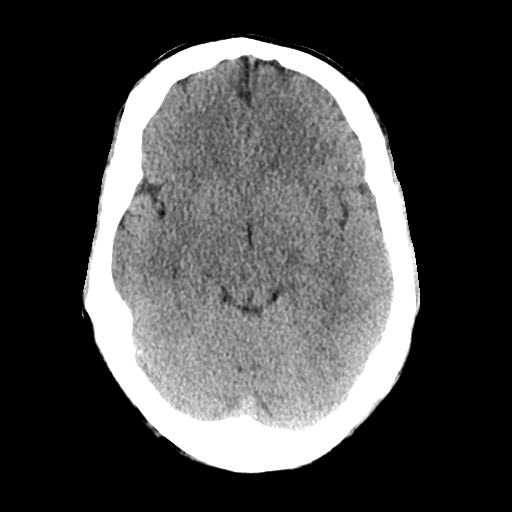
[im 16/32  brain]
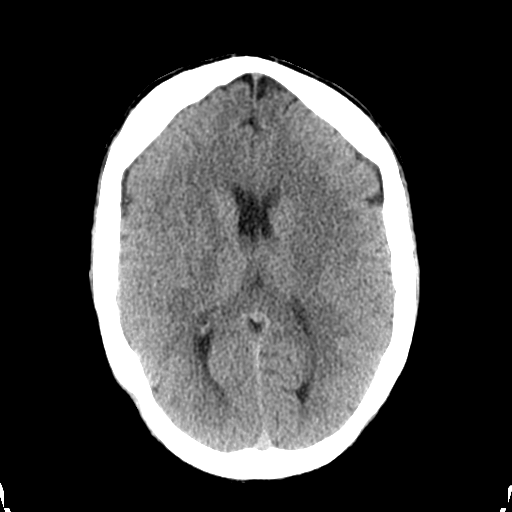
[im 20/32  brain]
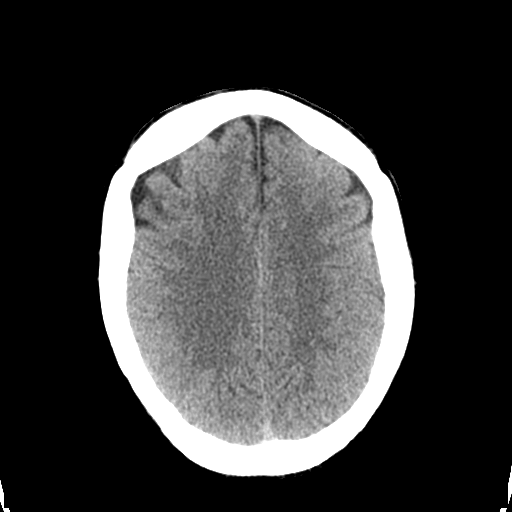
[im 20/32  bone]
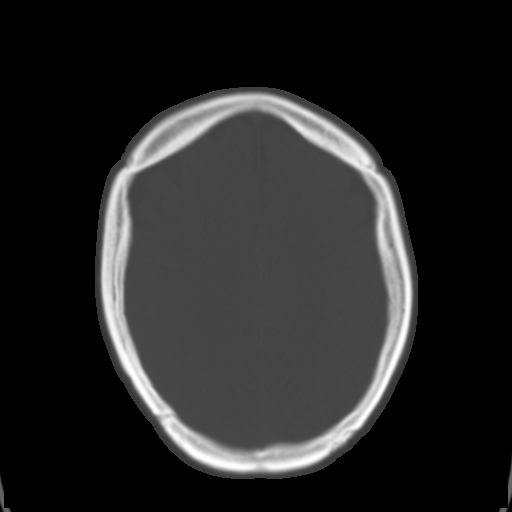
[im 24/32  brain]
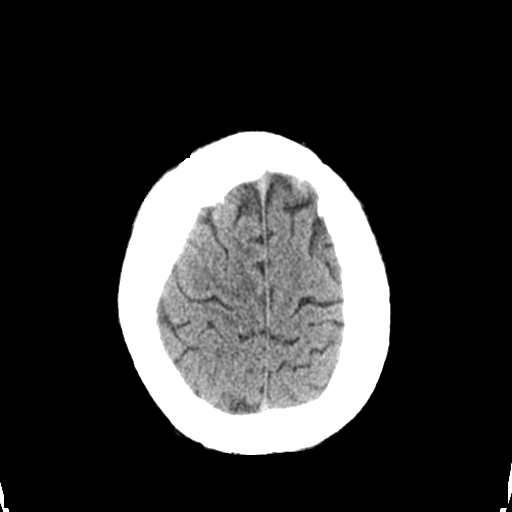
[im 28/32  brain]
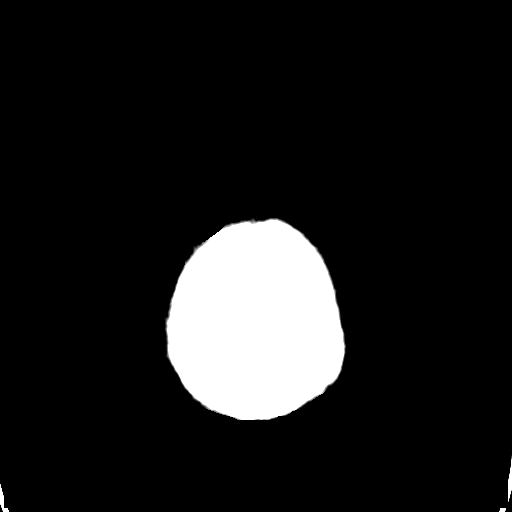

[Series 4: coronal soft tissue · coronal · 0.31mm/px · 3 of 68 slices shown]
[im 23/68  brain]
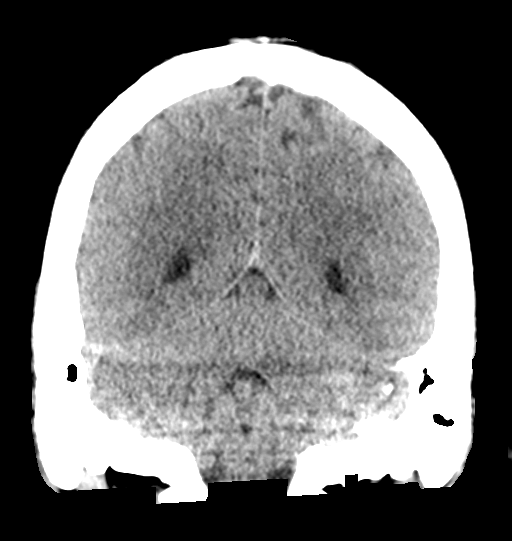
[im 30/68  brain]
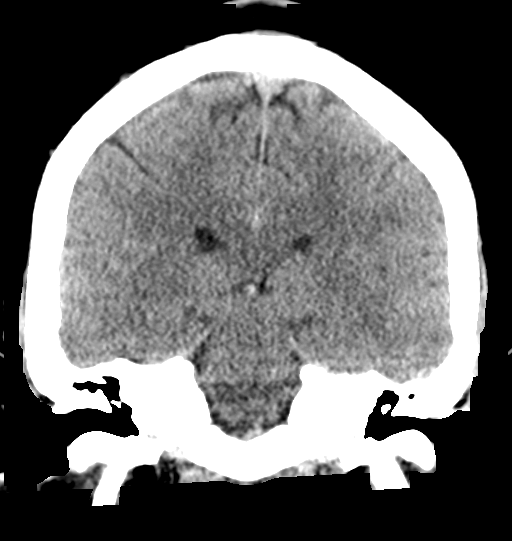
[im 38/68  brain]
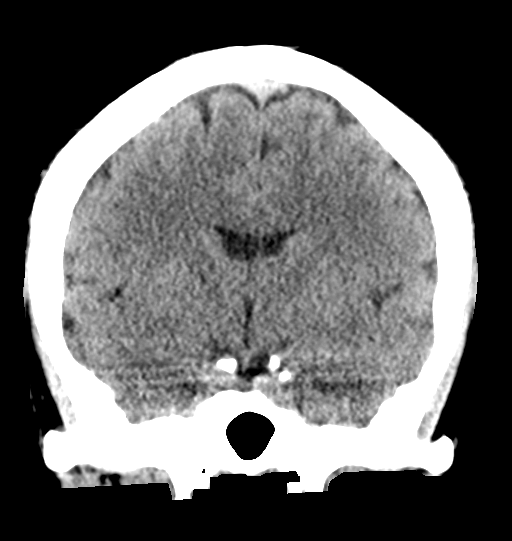

[Series 5: sagittal soft tissue · sagittal · 0.32mm/px · 3 of 53 slices shown]
[im 18/53  brain]
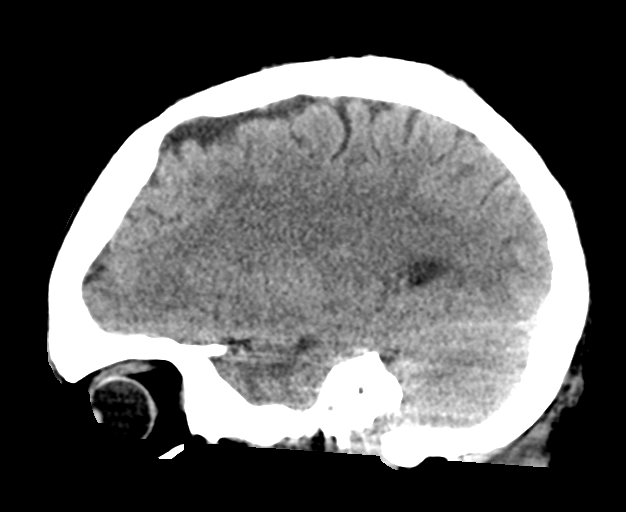
[im 27/53  brain]
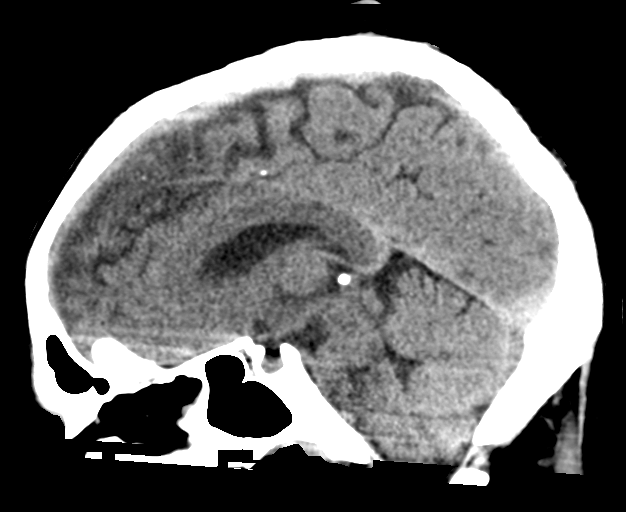
[im 35/53  brain]
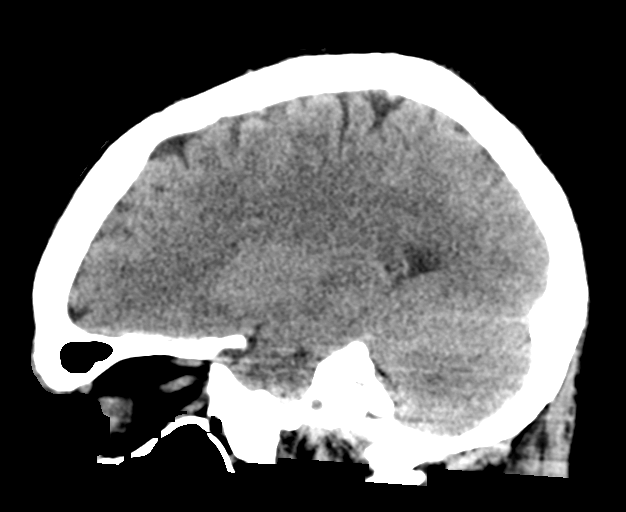

[17 of 47 positions shown; findings below may reference images not displayed]

FINDINGS: Brain: No evidence of acute infarction, hemorrhage, hydrocephalus,
extra-axial collection or mass lesion/mass effect.

Vascular: No hyperdense vessel or unexpected calcification.

Skull: Normal. Negative for fracture or focal lesion.

Sinuses/Orbits: No acute finding.

Other: None.
IMPRESSION: Unremarkable noncontrast head CT.

## 2022-12-19 IMAGING — CR DG CHEST 2V
2 series · 2 of 2 positions shown · non-contrast
Comparison: 12/03/2020

CLINICAL DATA: Chest pain.

EXAM:
CHEST - 2 VIEW

[chest pa]
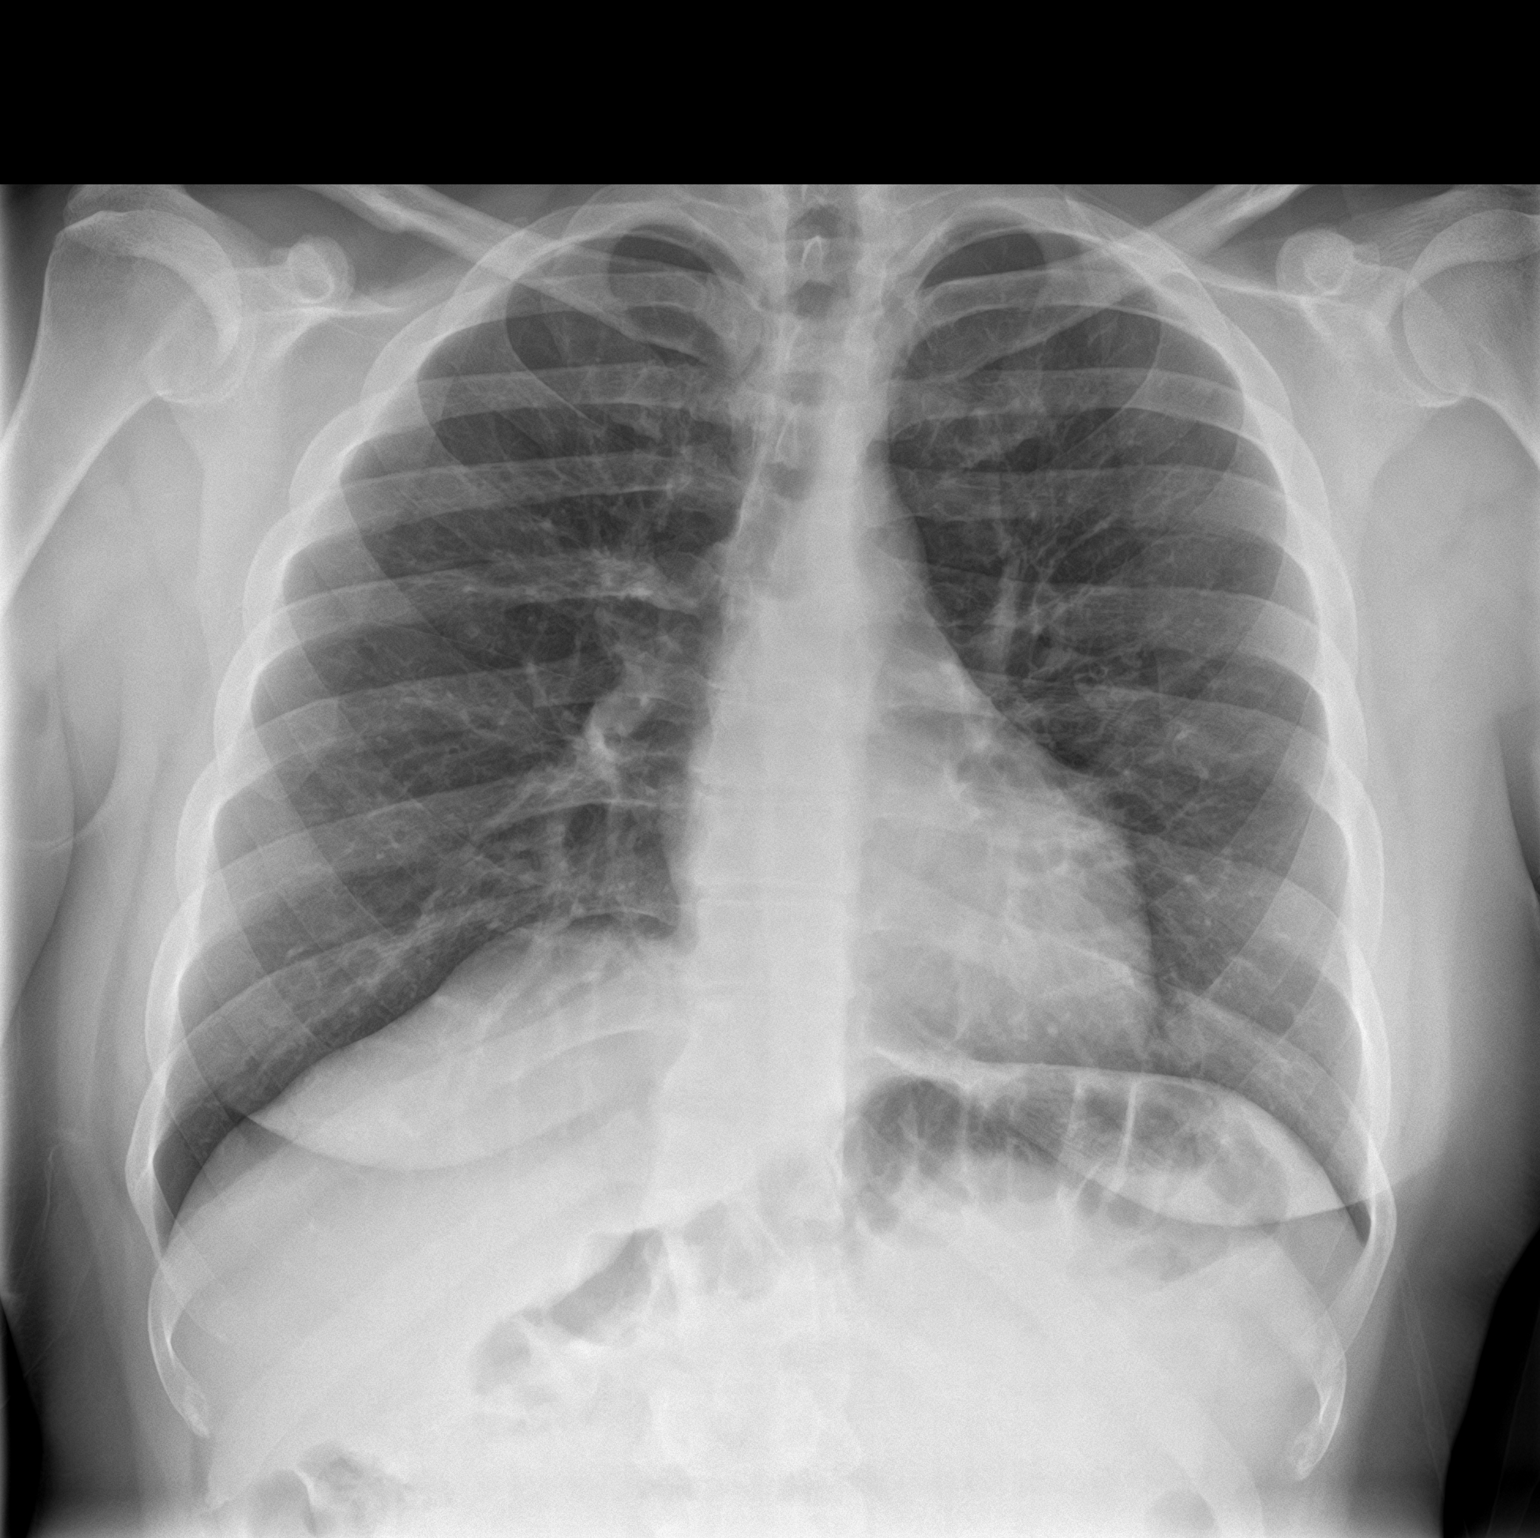

[chest lat]
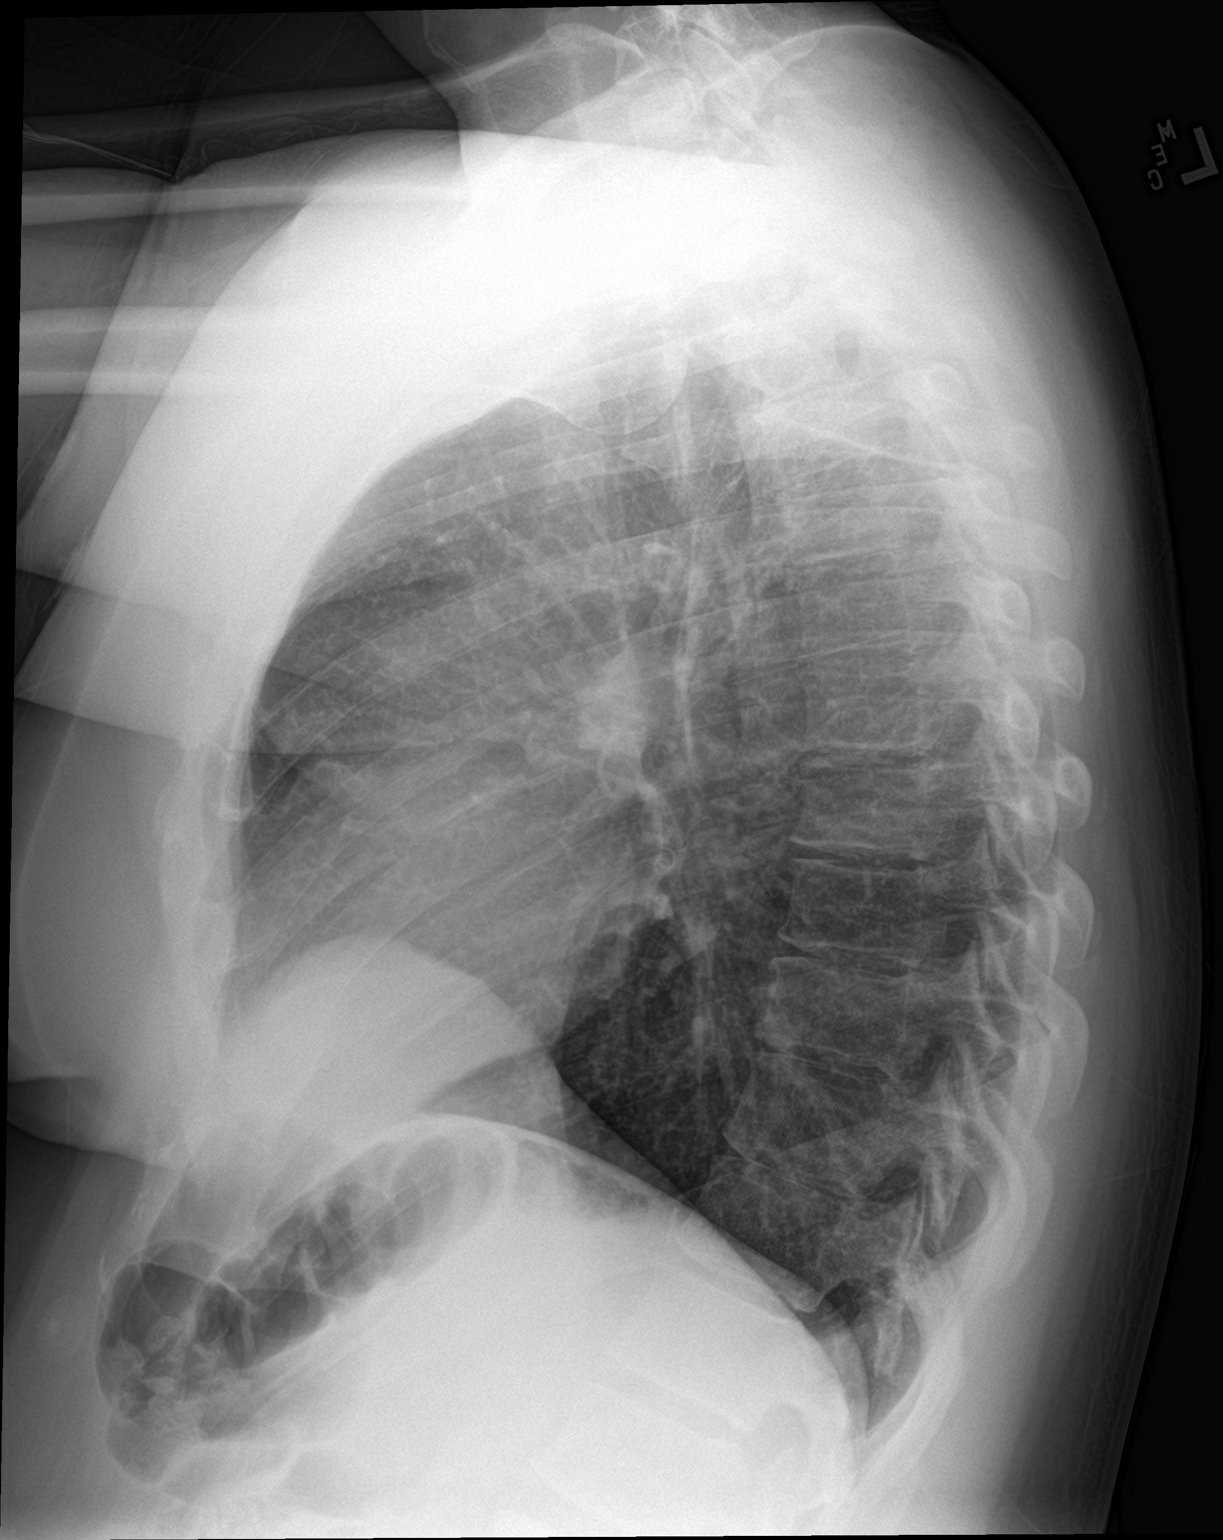

[2 of 2 positions shown; findings below may reference images not displayed]

FINDINGS: The heart size and mediastinal contours are within normal limits.
Both lungs are clear. The visualized skeletal structures are
unremarkable.
IMPRESSION: No active cardiopulmonary disease.

## 2023-01-14 ENCOUNTER — Emergency Department (HOSPITAL_COMMUNITY): Payer: MEDICAID

## 2023-01-14 ENCOUNTER — Encounter (HOSPITAL_COMMUNITY): Payer: Self-pay | Admitting: Emergency Medicine

## 2023-01-14 ENCOUNTER — Other Ambulatory Visit: Payer: Self-pay

## 2023-01-14 ENCOUNTER — Emergency Department (HOSPITAL_COMMUNITY)
Admission: EM | Admit: 2023-01-14 | Discharge: 2023-01-15 | Disposition: A | Discharge status: Home / Self Care | Payer: MEDICAID | Attending: Emergency Medicine | Admitting: Emergency Medicine

## 2023-01-14 DIAGNOSIS — E860 Dehydration: Secondary | ICD-10-CM | POA: Diagnosis not present

## 2023-01-14 DIAGNOSIS — F1510 Other stimulant abuse, uncomplicated: Secondary | ICD-10-CM | POA: Diagnosis present

## 2023-01-14 DIAGNOSIS — J45909 Unspecified asthma, uncomplicated: Secondary | ICD-10-CM | POA: Diagnosis not present

## 2023-01-14 DIAGNOSIS — Z91040 Latex allergy status: Secondary | ICD-10-CM | POA: Diagnosis not present

## 2023-01-14 LAB — CBC
HCT: 47.6 % (ref 39.0–52.0)
Hemoglobin: 15.6 g/dL (ref 13.0–17.0)
MCH: 27.7 pg (ref 26.0–34.0)
MCHC: 32.8 g/dL (ref 30.0–36.0)
MCV: 84.4 fL (ref 80.0–100.0)
Platelets: 301 10*3/uL (ref 150–400)
RBC: 5.64 MIL/uL (ref 4.22–5.81)
RDW: 14 % (ref 11.5–15.5)
WBC: 15.8 10*3/uL — ABNORMAL HIGH (ref 4.0–10.5)
nRBC: 0 % (ref 0.0–0.2)

## 2023-01-14 LAB — COMPREHENSIVE METABOLIC PANEL
ALT: 66 U/L — ABNORMAL HIGH (ref 0–44)
AST: 68 U/L — ABNORMAL HIGH (ref 15–41)
Albumin: 4.6 g/dL (ref 3.5–5.0)
Alkaline Phosphatase: 101 U/L (ref 38–126)
Anion gap: 11 (ref 5–15)
BUN: 17 mg/dL (ref 6–20)
CO2: 24 mmol/L (ref 22–32)
Calcium: 9.3 mg/dL (ref 8.9–10.3)
Chloride: 104 mmol/L (ref 98–111)
Creatinine, Ser: 0.99 mg/dL (ref 0.61–1.24)
GFR, Estimated: >60 mL/min (ref >60)
Glucose, Bld: 170 mg/dL — ABNORMAL HIGH (ref 70–99)
Potassium: 3.7 mmol/L (ref 3.5–5.1)
Sodium: 139 mmol/L (ref 135–145)
Total Bilirubin: 1.4 mg/dL — ABNORMAL HIGH (ref 0.3–1.2)
Total Protein: 8.6 g/dL — ABNORMAL HIGH (ref 6.5–8.1)

## 2023-01-14 LAB — LIPASE, BLOOD: Lipase: 19 U/L (ref 11–51)

## 2023-01-14 MED ORDER — LORAZEPAM 1 MG PO TABS
1.0000 mg | ORAL_TABLET | Freq: Once | ORAL | Status: AC
  Administered 2023-01-14: 1 mg via ORAL
  Filled 2023-01-14: qty 1

## 2023-01-14 MED ORDER — SODIUM CHLORIDE 0.9 % IV BOLUS
1000.0000 mL | Freq: Once | INTRAVENOUS | Status: AC
  Administered 2023-01-14: 1000 mL via INTRAVENOUS

## 2023-01-14 NOTE — ED Triage Notes (Signed)
Pt BIB EMS for complaints of dehydration and weakness. Tachy en route 140-150, came down to 100-110 after 500 nmL bolus of NS. Relapsed on meth, last used approximately 2 hrs PTA.

## 2023-01-14 NOTE — ED Notes (Signed)
Patient transported to CT 

## 2023-01-14 NOTE — ED Provider Notes (Signed)
[place] EMERGENCY DEPARTMENT AT [facility] Provider Note   CSN: [id number] Arrival date & time: 01/14/23  2255     History  Chief Complaint  Patient presents with   Dehydration    Nathan Koch is a 32 y.o. male with medical history anxiety, asthma, bipolar 1 disorder, depression, methamphetamine abuse.  Patient presents to ED for evaluation of methamphetamine relapse.  Patient states that over the last 4 days he has used between 40 to 50 g of methamphetamine.  He states that prior to this he was clean for 9 months.  States that he is under a lot of stress right now and this is probably the reason for his relapse.  He states he is felt generally weak over the last few days, having a lot of diarrhea and abdominal pain.  Denies chest pain but does endorse shortness of breath.  Denies fevers, one-sided weakness, dysuria.  HPI     Home Medications Prior to Admission medications   Medication Sig Start Date End Date Taking? Authorizing Provider  hydrOXYzine (VISTARIL) 50 MG capsule Take 50 mg by mouth daily as needed. 01/12/23  Yes [provider]  albuterol (VENTOLIN HFA) 108 (90 Base) MCG/ACT inhaler Inhale 2 puffs into the lungs every 6 (six) hours as needed for shortness of breath. 01/07/22   Clapacs, Jackquline Denmark, MD  hydrocortisone cream 0.5 % Apply topically 2 (two) times daily. Patient not taking: Reported on 01/15/2023 01/07/22   Clapacs, Jackquline Denmark, MD  hydrOXYzine (ATARAX) 50 MG tablet Take 1 tablet (50 mg total) by mouth every 6 (six) hours as needed for anxiety. 01/07/22   Clapacs, Jackquline Denmark, MD  lamoTRIgine (LAMICTAL) 25 MG tablet Take 2 tablets (50 mg total) by mouth daily. 01/07/22   Clapacs, Jackquline Denmark, MD  metFORMIN (GLUCOPHAGE-XR) 500 MG 24 hr tablet Take 1 tablet by mouth daily with breakfast. Patient not taking: Reported on 01/15/2023 05/26/22 05/25/23  [provider]  nicotine (NICODERM CQ - DOSED IN MG/24 HOURS) 14 mg/24hr patch Place 1 patch (14 mg total)  onto the skin daily. Patient not taking: Reported on 01/15/2023 01/07/22   Clapacs, Jackquline Denmark, MD  QUEtiapine (SEROQUEL) 50 MG tablet Take 3 tablets (150 mg total) by mouth at bedtime. 01/07/22   Clapacs, Jackquline Denmark, MD      Allergies    Latex    Review of Systems   Review of Systems  Constitutional:  Negative for fever.  Respiratory:  Positive for shortness of breath.   Cardiovascular:  Negative for chest pain.  Gastrointestinal:  Positive for abdominal pain and diarrhea.  Genitourinary:  Negative for dysuria.  Neurological:  Positive for weakness.  All other systems reviewed and are negative.   Physical Exam Updated Vital Signs BP 122/64   Pulse (!) 108   Temp 98.4 F (36.9 C) (Oral)   Resp 16   Ht 5\' 9"  (1.753 m)   Wt 109.8 kg   SpO2 100%   BMI 35.74 kg/m  Physical Exam Vitals and nursing note reviewed.  Constitutional:      General: He is not in acute distress.    Appearance: Normal appearance. He is not ill-appearing, toxic-appearing or diaphoretic.  HENT:     Head: Normocephalic and atraumatic.     Nose: Nose normal.     Mouth/Throat:     Mouth: Mucous membranes are moist.     Pharynx: Oropharynx is clear.  Eyes:     Extraocular Movements: Extraocular movements intact.  Conjunctiva/sclera: Conjunctivae normal.     Pupils: Pupils are equal, round, and reactive to light.  Cardiovascular:     Rate and Rhythm: Regular rhythm. Tachycardia present.  Pulmonary:     Effort: Pulmonary effort is normal.     Breath sounds: Normal breath sounds. No wheezing.  Abdominal:     General: Abdomen is flat. Bowel sounds are normal.     Palpations: Abdomen is soft.     Tenderness: There is no abdominal tenderness.  Musculoskeletal:     Cervical back: Normal range of motion and neck supple. No tenderness.  Skin:    General: Skin is warm and dry.     Capillary Refill: Capillary refill takes less than 2 seconds.  Neurological:     Mental Status: He is alert and oriented to person,  place, and time.     ED Results / Procedures / Treatments   Labs (all labs ordered are listed, but only abnormal results are displayed) Labs Reviewed  CBC - Abnormal; Notable for the following components:      Result Value   WBC 15.8 (*)    All other components within normal limits  URINALYSIS, ROUTINE W REFLEX MICROSCOPIC - Abnormal; Notable for the following components:   Glucose, UA >=500 (*)    Hgb urine dipstick MODERATE (*)    Ketones, ur 20 (*)    Protein, ur 100 (*)    Bacteria, UA RARE (*)    All other components within normal limits  COMPREHENSIVE METABOLIC PANEL - Abnormal; Notable for the following components:   Glucose, Bld 170 (*)    Total Protein 8.6 (*)    AST 68 (*)    ALT 66 (*)    Total Bilirubin 1.4 (*)    All other components within normal limits  RAPID URINE DRUG SCREEN, HOSP PERFORMED - Abnormal; Notable for the following components:   Benzodiazepines POSITIVE (*)    Amphetamines POSITIVE (*)    All other components within normal limits  LIPASE, BLOOD    EKG EKG Interpretation Date/Time:  Thursday January 14 2023 23:05:52 EDT Ventricular Rate:  116 PR Interval:  148 QRS Duration:  81 QT Interval:  351 QTC Calculation: 488 R Axis:   89  Text Interpretation: Sinus tachycardia Consider right atrial enlargement Borderline prolonged QT interval Confirmed by Zadie Rhine ([id number]) on 01/15/2023 12:08:18 AM  Radiology DG Chest 2 View  Result Date: 01/15/2023 CLINICAL DATA:  sob EXAM: CHEST - 2 VIEW COMPARISON:  07/23/2021. FINDINGS: Cardiac silhouette is unremarkable. No pneumothorax or pleural effusion. The lungs are clear. The visualized skeletal structures are unremarkable. IMPRESSION: No acute cardiopulmonary process. Electronically Signed   By: Layla Maw M.D.   On: 01/15/2023 00:09    Procedures Procedures   Medications Ordered in ED Medications  sodium chloride 0.9 % bolus 1,000 mL (0 mLs Intravenous Stopped 01/15/23 0256)   LORazepam (ATIVAN) tablet 1 mg (1 mg Oral Given 01/14/23 2330)  sodium chloride 0.9 % bolus 1,000 mL (1,000 mLs Intravenous New Bag/Given 01/15/23 0038)    ED Course/ Medical Decision Making/ A&P  Medical Decision Making Amount and/or Complexity of Data Reviewed Labs: ordered. Radiology: ordered.  Risk Prescription drug management.   32 year old male presents to the ED for evaluation.  Please see HPI for further details.  On examination the patient is afebrile, tachycardic.  Patient reports methamphetamine abuse over the last 4 days that this is most likely cause of patient tachycardia.  His lung sounds are clear bilaterally, he  is not hypoxic.  Abdomen is soft and compressible throughout.  Neurological examination at baseline.  Patient labs assessed.  The patient labs show leukocytosis of 15.8, no anemia.  Leukocytosis could be secondary to stress response as patient is tachycardic and has been consuming excessive amount of stimulants over the last 4 days.  Patient metabolic panel shows glucose 170, elevated AST, ALT, elevated total bilirubin however this seems to be patient baseline.  Patient denies abdominal pain.  Urinalysis shows glucose, ketones and protein indicative of dehydration.  The patient lipase is WNL.  Patient rapid urine drug screen shows positive benzodiazepines and vitamins.  Chest x-ray shows no consolidations or effusions.  EKG shows sinus tachycardia.  Patient given 2 L of fluid for tachycardia.  Patient given 1 mg of Ativan.  On reassessment, patient pulse rate has decreased to 108 at this time.  Patient has not nausea or vomiting.  Patient reports he does feel better.  Patient be discharged at this time with outpatient resources for substance use treatment.  Patient encouraged to return to the ED with any new or worsening signs or symptoms and he voiced understanding.  He is all of his questions answered to his satisfaction.  Stable to discharge at this time.   Final  Clinical Impression(s) / ED Diagnoses Final diagnoses:  Methamphetamine abuse [facility])  Dehydration    Rx / DC Orders ED Discharge Orders     None         Al Decant, PA-C 01/15/23 0301    Tegeler, Canary Brim, MD 01/16/23 2024

## 2023-01-14 NOTE — ED Provider Notes (Incomplete)
[place] EMERGENCY DEPARTMENT AT [facility] Provider Note   CSN: [id number] Arrival date & time: 01/14/23  2255     History  Chief Complaint  Patient presents with  . Dehydration    Nathan Koch is a 31 y.o. male with medical history anxiety, asthma, bipolar 1 disorder, depression, methamphetamine abuse.  Patient presents to ED for evaluation of methamphetamine relapse.  Patient states that over the last 4 days he has used between 40 and 50 g of methamphetamine.  He states that prior to this he was clean for 9 months.  States that he is under a lot of stress right now and this is probably the reason for his relapse.  He states he is felt generally weak over the last few days, having a lot of diarrhea and abdominal pain.  Denies chest pain but does endorse shortness of breath.  Denies fevers, one-sided weakness, dysuria.  HPI     Home Medications Prior to Admission medications   Medication Sig Start Date End Date Taking? Authorizing Provider  albuterol (VENTOLIN HFA) 108 (90 Base) MCG/ACT inhaler Inhale 2 puffs into the lungs every 6 (six) hours as needed for shortness of breath. 01/07/22   Clapacs, Jackquline Denmark, MD  hydrocortisone cream 0.5 % Apply topically 2 (two) times daily. 01/07/22   Clapacs, Jackquline Denmark, MD  hydrOXYzine (ATARAX) 50 MG tablet Take 1 tablet (50 mg total) by mouth every 6 (six) hours as needed for anxiety. 01/07/22   Clapacs, Jackquline Denmark, MD  lamoTRIgine (LAMICTAL) 25 MG tablet Take 2 tablets (50 mg total) by mouth daily. 01/07/22   Clapacs, Jackquline Denmark, MD  nicotine (NICODERM CQ - DOSED IN MG/24 HOURS) 14 mg/24hr patch Place 1 patch (14 mg total) onto the skin daily. 01/07/22   Clapacs, Jackquline Denmark, MD  QUEtiapine (SEROQUEL) 50 MG tablet Take 3 tablets (150 mg total) by mouth at bedtime. 01/07/22   Clapacs, Jackquline Denmark, MD      Allergies    Latex    Review of Systems   Review of Systems  Physical Exam Updated Vital Signs BP 128/78   Pulse (!) 118   Temp 98.4 F (36.9 C)  (Oral)   Resp 16   Ht 5\' 9"  (1.753 m)   Wt 109.8 kg   SpO2 100%   BMI 35.74 kg/m  Physical Exam  ED Results / Procedures / Treatments   Labs (all labs ordered are listed, but only abnormal results are displayed) Labs Reviewed  CBC  URINALYSIS, ROUTINE W REFLEX MICROSCOPIC  COMPREHENSIVE METABOLIC PANEL  RAPID URINE DRUG SCREEN, HOSP PERFORMED  LIPASE, BLOOD    EKG None  Radiology No results found.  Procedures Procedures  {Document cardiac monitor, telemetry assessment procedure when appropriate:1}  Medications Ordered in ED Medications  sodium chloride 0.9 % bolus 1,000 mL (has no administration in time range)    ED Course/ Medical Decision Making/ A&P   {   Click here for ABCD2, HEART and other calculatorsREFRESH Note before signing :1}                              Medical Decision Making Amount and/or Complexity of Data Reviewed Labs: ordered. Radiology: ordered.   ***  {Document critical care time when appropriate:1} {Document review of labs and clinical decision tools ie heart score, Chads2Vasc2 etc:1}  {Document your independent review of radiology images, and any outside records:1} {Document your discussion with family  members, caretakers, and with consultants:1} {Document social determinants of health affecting pt's care:1} {Document your decision making why or why not admission, treatments were needed:1} Final Clinical Impression(s) / ED Diagnoses Final diagnoses:  None    Rx / DC Orders ED Discharge Orders     None

## 2023-01-15 LAB — URINALYSIS, ROUTINE W REFLEX MICROSCOPIC
Bilirubin Urine: NEGATIVE
Glucose, UA: >=500 mg/dL — AB
Ketones, ur: 20 mg/dL — AB
Leukocytes,Ua: NEGATIVE
Nitrite: NEGATIVE
Protein, ur: 100 mg/dL — AB
Specific Gravity, Urine: 1.022 (ref 1.005–1.030)
pH: 5 (ref 5.0–8.0)

## 2023-01-15 LAB — RAPID URINE DRUG SCREEN, HOSP PERFORMED
Amphetamines: POSITIVE — AB
Barbiturates: NOT DETECTED
Benzodiazepines: POSITIVE — AB
Cocaine: NOT DETECTED
Opiates: NOT DETECTED
Tetrahydrocannabinol: NOT DETECTED

## 2023-01-15 MED ORDER — SODIUM CHLORIDE 0.9 % IV BOLUS
1000.0000 mL | Freq: Once | INTRAVENOUS | Status: AC
  Administered 2023-01-15: 1000 mL via INTRAVENOUS

## 2023-01-15 NOTE — ED Notes (Signed)
Pt tolerating oral trial without vomiting

## 2023-01-15 NOTE — Discharge Instructions (Addendum)
It was a pleasure taking part in your care today.  As we discussed, your workup is largely reassuring.  Your heart rate is still elevated however most likely secondary to using methamphetamine over the last 4 days.  Please use the attached resources for substance use treatment and counseling.  Please follow-up with substance use treatment center.  Please return to the ED with any new or worsening signs or symptoms.

## 2023-01-15 NOTE — ED Notes (Signed)
Discharge instructions provided by epd along with resources were discussed with pt. Pt verbalized understanding with no additional questions at this time.

## 2023-01-17 ENCOUNTER — Ambulatory Visit (HOSPITAL_COMMUNITY)
Admission: EM | Admit: 2023-01-17 | Discharge: 2023-01-18 | Disposition: A | Discharge status: Other Acute Inpatient Hospital | Payer: MEDICAID | Attending: Urology | Admitting: Urology

## 2023-01-17 DIAGNOSIS — F1994 Other psychoactive substance use, unspecified with psychoactive substance-induced mood disorder: Secondary | ICD-10-CM | POA: Diagnosis present

## 2023-01-17 DIAGNOSIS — F319 Bipolar disorder, unspecified: Secondary | ICD-10-CM | POA: Diagnosis present

## 2023-01-17 DIAGNOSIS — F1514 Other stimulant abuse with stimulant-induced mood disorder: Secondary | ICD-10-CM | POA: Insufficient documentation

## 2023-01-17 DIAGNOSIS — R45851 Suicidal ideations: Secondary | ICD-10-CM

## 2023-01-17 DIAGNOSIS — F1510 Other stimulant abuse, uncomplicated: Secondary | ICD-10-CM

## 2023-01-17 DIAGNOSIS — F4310 Post-traumatic stress disorder, unspecified: Secondary | ICD-10-CM

## 2023-01-17 DIAGNOSIS — Z5900 Homelessness unspecified: Secondary | ICD-10-CM

## 2023-01-17 DIAGNOSIS — F1520 Other stimulant dependence, uncomplicated: Secondary | ICD-10-CM | POA: Diagnosis present

## 2023-01-17 LAB — COMPREHENSIVE METABOLIC PANEL
ALT: 81 U/L — ABNORMAL HIGH (ref 0–44)
AST: 109 U/L — ABNORMAL HIGH (ref 15–41)
Albumin: 4.4 g/dL (ref 3.5–5.0)
Alkaline Phosphatase: 112 U/L (ref 38–126)
Anion gap: 16 — ABNORMAL HIGH (ref 5–15)
BUN: 21 mg/dL — ABNORMAL HIGH (ref 6–20)
CO2: 20 mmol/L — ABNORMAL LOW (ref 22–32)
Calcium: 9.8 mg/dL (ref 8.9–10.3)
Chloride: 102 mmol/L (ref 98–111)
Creatinine, Ser: 1.24 mg/dL (ref 0.61–1.24)
GFR, Estimated: >60 mL/min (ref >60)
Glucose, Bld: 86 mg/dL (ref 70–99)
Potassium: 4.5 mmol/L (ref 3.5–5.1)
Sodium: 138 mmol/L (ref 135–145)
Total Bilirubin: 3 mg/dL — ABNORMAL HIGH (ref 0.3–1.2)
Total Protein: 8.2 g/dL — ABNORMAL HIGH (ref 6.5–8.1)

## 2023-01-17 LAB — POCT URINE DRUG SCREEN - MANUAL ENTRY (I-SCREEN)
POC Amphetamine UR: POSITIVE — AB
POC Buprenorphine (BUP): NOT DETECTED
POC Cocaine UR: NOT DETECTED
POC Marijuana UR: POSITIVE — AB
POC Methadone UR: POSITIVE — AB
POC Methamphetamine UR: POSITIVE — AB
POC Morphine: POSITIVE — AB
POC Oxazepam (BZO): POSITIVE — AB
POC Oxycodone UR: POSITIVE — AB
POC Secobarbital (BAR): NOT DETECTED

## 2023-01-17 LAB — CBC WITH DIFFERENTIAL/PLATELET
Abs Immature Granulocytes: 0.06 10*3/uL (ref 0.00–0.07)
Basophils Absolute: 0.1 10*3/uL (ref 0.0–0.1)
Basophils Relative: 0 %
Eosinophils Absolute: 0.1 10*3/uL (ref 0.0–0.5)
Eosinophils Relative: 1 %
HCT: 44.5 % (ref 39.0–52.0)
Hemoglobin: 14.8 g/dL (ref 13.0–17.0)
Immature Granulocytes: 0 %
Lymphocytes Relative: 13 %
Lymphs Abs: 2.1 10*3/uL (ref 0.7–4.0)
MCH: 27.6 pg (ref 26.0–34.0)
MCHC: 33.3 g/dL (ref 30.0–36.0)
MCV: 82.9 fL (ref 80.0–100.0)
Monocytes Absolute: 1.6 10*3/uL — ABNORMAL HIGH (ref 0.1–1.0)
Monocytes Relative: 10 %
Neutro Abs: 12 10*3/uL — ABNORMAL HIGH (ref 1.7–7.7)
Neutrophils Relative %: 76 %
Platelets: 361 10*3/uL (ref 150–400)
RBC: 5.37 MIL/uL (ref 4.22–5.81)
RDW: 13.5 % (ref 11.5–15.5)
WBC: 15.8 10*3/uL — ABNORMAL HIGH (ref 4.0–10.5)
nRBC: 0 % (ref 0.0–0.2)

## 2023-01-17 LAB — ETHANOL: Alcohol, Ethyl (B): <10 mg/dL (ref <10)

## 2023-01-17 LAB — LIPID PANEL
Cholesterol: 136 mg/dL (ref 0–200)
HDL: 37 mg/dL — ABNORMAL LOW (ref >40)
LDL Cholesterol: 79 mg/dL (ref 0–99)
Total CHOL/HDL Ratio: 3.7 RATIO
Triglycerides: 101 mg/dL (ref <150)
VLDL: 20 mg/dL (ref 0–40)

## 2023-01-17 LAB — TSH: TSH: 6.861 u[IU]/mL — ABNORMAL HIGH (ref 0.350–4.500)

## 2023-01-17 LAB — T4, FREE: Free T4: 1.28 ng/dL — ABNORMAL HIGH (ref 0.61–1.12)

## 2023-01-17 LAB — HEMOGLOBIN A1C
Hgb A1c MFr Bld: 7.2 % — ABNORMAL HIGH (ref 4.8–5.6)
Mean Plasma Glucose: 159.94 mg/dL

## 2023-01-17 MED ORDER — OLANZAPINE 5 MG PO TBDP
5.0000 mg | ORAL_TABLET | Freq: Three times a day (TID) | ORAL | Status: DC | PRN
  Administered 2023-01-17: 5 mg via ORAL
  Filled 2023-01-17: qty 1

## 2023-01-17 MED ORDER — LORAZEPAM 1 MG PO TABS: 1.0000 mg | ORAL_TABLET | ORAL | Status: DC | PRN

## 2023-01-17 MED ORDER — TRAZODONE HCL 50 MG PO TABS
50.0000 mg | ORAL_TABLET | Freq: Every evening | ORAL | Status: DC | PRN
  Administered 2023-01-17: 50 mg via ORAL
  Filled 2023-01-17: qty 1

## 2023-01-17 MED ORDER — QUETIAPINE FUMARATE 50 MG PO TABS
150.0000 mg | ORAL_TABLET | Freq: Every day | ORAL | Status: DC
  Administered 2023-01-17: 150 mg via ORAL
  Filled 2023-01-17: qty 3

## 2023-01-17 MED ORDER — MAGNESIUM HYDROXIDE 400 MG/5ML PO SUSP: 30.0000 mL | Freq: Every day | ORAL | Status: DC | PRN

## 2023-01-17 MED ORDER — HYDROCORTISONE 1 % EX CREA: TOPICAL_CREAM | Freq: Three times a day (TID) | CUTANEOUS | Status: DC | PRN

## 2023-01-17 MED ORDER — NICOTINE 14 MG/24HR TD PT24
14.0000 mg | MEDICATED_PATCH | Freq: Every day | TRANSDERMAL | Status: DC
  Filled 2023-01-17: qty 1

## 2023-01-17 MED ORDER — DICYCLOMINE HCL 20 MG PO TABS: 20.0000 mg | ORAL_TABLET | Freq: Four times a day (QID) | ORAL | Status: DC | PRN

## 2023-01-17 MED ORDER — HYDROXYZINE HCL 25 MG PO TABS: 25.0000 mg | ORAL_TABLET | Freq: Three times a day (TID) | ORAL | Status: DC | PRN

## 2023-01-17 MED ORDER — METHOCARBAMOL 500 MG PO TABS
500.0000 mg | ORAL_TABLET | Freq: Three times a day (TID) | ORAL | Status: DC | PRN
  Administered 2023-01-17: 500 mg via ORAL
  Filled 2023-01-17: qty 1

## 2023-01-17 MED ORDER — ONDANSETRON 4 MG PO TBDP: 4.0000 mg | ORAL_TABLET | Freq: Four times a day (QID) | ORAL | Status: DC | PRN

## 2023-01-17 MED ORDER — ZIPRASIDONE MESYLATE 20 MG IM SOLR: 20.0000 mg | INTRAMUSCULAR | Status: DC | PRN

## 2023-01-17 MED ORDER — HYDROXYZINE HCL 25 MG PO TABS
25.0000 mg | ORAL_TABLET | Freq: Four times a day (QID) | ORAL | Status: DC | PRN
  Administered 2023-01-17: 25 mg via ORAL
  Filled 2023-01-17: qty 1

## 2023-01-17 MED ORDER — ACETAMINOPHEN 325 MG PO TABS: 650.0000 mg | ORAL_TABLET | Freq: Four times a day (QID) | ORAL | Status: DC | PRN

## 2023-01-17 MED ORDER — NAPROXEN 500 MG PO TABS
500.0000 mg | ORAL_TABLET | Freq: Two times a day (BID) | ORAL | Status: DC | PRN
  Administered 2023-01-17: 500 mg via ORAL
  Filled 2023-01-17: qty 1

## 2023-01-17 MED ORDER — ALUM & MAG HYDROXIDE-SIMETH 200-200-20 MG/5ML PO SUSP: 30.0000 mL | ORAL | Status: DC | PRN

## 2023-01-17 MED ORDER — LOPERAMIDE HCL 2 MG PO CAPS: 2.0000 mg | ORAL_CAPSULE | ORAL | Status: DC | PRN

## 2023-01-17 NOTE — ED Provider Notes (Signed)
TSH elevated 6.861 on 9/22. Previous TSH level show elevation and past T3 and T4 levels were normal. I do not see history of medications prescribed for thyroidism. Will add on T3 and T4. Patient will need to be recommended to follow up with endocrinology upon discharge.

## 2023-01-17 NOTE — ED Notes (Signed)
Pt sleeping@this time breathing even and unlabored will continue to monitor for safety 

## 2023-01-17 NOTE — ED Provider Notes (Signed)
[facility] Continuous Assessment Admission H&P  Date: 01/17/23 Patient Name: Nathan Koch MRN: [id number] Chief Complaint: "  Diagnoses:  Final diagnoses:  Methamphetamine abuse ([name])  Substance induced mood disorder ([name])    HPI: Nathan Koch is a 32 year old male with psychiatric history significant for methamphetamine use disorder, depression, suicidal ideation, bipolar 1 disorder, PTSD, and benzodiazepine abuse.  Patient presented voluntarily to [facility] reporting suicidal ideation with a plan to walk into traffic and requesting detox and substance abuse treatment.  This nurse practitioner reviewed patient's chart and evaluated with him face-to-face.  On assessment, patient reports that he has been using methamphetamine for over 10 years. He reports periods 2 to 3 months periods of sobriety however he continues to relapse due to stressors such as homelessness, unemployment, back pain, and depression. Patient reports significant increase in meth use over the past 4 weeks due to increase worsening depressive symptoms. He says he is currently not on any psychiatric medication. He endorses depressive symptoms of hopelessness, worthlessness, poor sleep, anxiety, racing thoughts, fatigue, and low mood.  Patient expressed suicidal ideation with a plan to walk into traffic.  Patient reported that he attempted to walk into traffic multiple times this morning however the cars swerved away and the drivers missed him.  He says that he is actively suicidal and cannot contract for safety at this time.  Patient denies homicidal ideation and paranoia.  He reports visual hallucination of " shapes of people" and auditory hallucination of voices telling him " hurt yourself."  Patient denies all other substance use except for meth.    Total Time spent with patient: 30 minutes  Musculoskeletal  Strength & Muscle Tone: within normal limits Gait & Station: normal Patient leans: Right  Psychiatric  Specialty Exam  Presentation General Appearance:  Disheveled  Eye Contact: Good  Speech: Clear and Coherent  Speech Volume: Normal  Handedness: Right   Mood and Affect  Mood: Depressed  Affect: Congruent   Thought Process  Thought Processes: Coherent  Descriptions of Associations:Intact  Orientation:Full (Time, Place and Person)  Thought Content:WDL  Diagnosis of Schizophrenia or Schizoaffective disorder in past: No  Duration of Psychotic Symptoms: Greater than six months  Hallucinations:Hallucinations: Auditory; Visual Description of Auditory Hallucinations: "Hurt yourself" Description of Visual Hallucinations: "shapes of people"  Ideas of Reference:None  Suicidal Thoughts:Suicidal Thoughts: Yes, Passive SI Passive Intent and/or Plan: With Plan; With Intent  Homicidal Thoughts:Homicidal Thoughts: No   Sensorium  Memory: Immediate Fair; Recent Poor; Remote Poor  Judgment: Fair  Insight: Fair   Chartered certified accountant: Fair  Attention Span: Fair  Recall: Fair  Fund of Knowledge: Fair  Language: Fair   Psychomotor Activity  Psychomotor Activity:No data recorded  Assets  Assets: Desire for Improvement; Communication Skills   Sleep  Sleep: Sleep: Poor Number of Hours of Sleep: 3   Nutritional Assessment (For OBS and FBC admissions only) Has the patient had a weight loss or gain of 10 pounds or more in the last 3 months?: No Has the patient had a decrease in food intake/or appetite?: No Does the patient have dental problems?: No Does the patient have eating habits or behaviors that may be indicators of an eating disorder including binging or inducing vomiting?: No Has the patient recently lost weight without trying?: 0 Has the patient been eating poorly because of a decreased appetite?: 0 Malnutrition Screening Tool Score: 0    Physical Exam Vitals and nursing note reviewed.  Constitutional:  General:  He is not in acute distress.    Appearance: He is well-developed.  HENT:     Head: Normocephalic.  Eyes:     Conjunctiva/sclera: Conjunctivae normal.  Cardiovascular:     Rate and Rhythm: Tachycardia present.  Pulmonary:     Effort: Pulmonary effort is normal.  Abdominal:     Palpations: Abdomen is soft.  Musculoskeletal:     Cervical back: Normal range of motion.     Comments: Back pain  Skin:    Findings: Lesion (blister to feet) and rash (psoraisis) present.  Neurological:     Mental Status: He is alert and oriented to person, place, and time.  Psychiatric:        Attention and Perception: Attention and perception normal.        Mood and Affect: Mood is depressed.        Speech: Speech normal.        Behavior: Behavior normal. Behavior is cooperative.        Thought Content: Thought content includes suicidal ideation. Thought content includes suicidal plan.        Cognition and Memory: Cognition normal.    Review of Systems  Constitutional: Negative.   HENT: Negative.    Eyes: Negative.   Respiratory: Negative.    Cardiovascular: Negative.   Gastrointestinal: Negative.   Genitourinary: Negative.   Musculoskeletal: Negative.   Skin: Negative.   Neurological: Negative.   Endo/Heme/Allergies: Negative.   Psychiatric/Behavioral:  Positive for depression, hallucinations, substance abuse and suicidal ideas. The patient is nervous/anxious.     Blood pressure 90/69, pulse (!) 111, temperature 98.1 F (36.7 C), resp. rate 16, SpO2 98%. There is no height or weight on file to calculate BMI.  Past Psychiatric History: methamphetamine use disorder, depression, suicidal ideation, bipolar 1 disorder, PTSD, and benzodiazepine abuse.    Is the patient at risk to self? Yes  Has the patient been a risk to self in the past 6 months? No .    Has the patient been a risk to self within the distant past? Yes   Is the patient a risk to others? No   Has the patient been a risk to others  in the past 6 months? No   Has the patient been a risk to others within the distant past? No   Past Medical History:  Past Medical History:  Diagnosis Date   Anxiety    Anxiety disorder    Asthma    Bipolar 1 disorder ([name])    Depression    Methamphetamine abuse ([name])    PTSD (post-traumatic stress disorder)     Family History:  Family History  Problem Relation Age of Onset   Asthma Mother    Cancer Mother        breast cancer   Ulcers Mother    Cancer Father        esophogeal cancer   Hypertension Father    Asthma Brother      Social History:  Social History   Tobacco Use   Smoking status: Every Day    Current packs/day: 0.50    Average packs/day: 0.5 packs/day for 8.0 years (4.0 ttl pk-yrs)    Types: Cigarettes   Smokeless tobacco: Never  Vaping Use   Vaping status: Every Day   Substances: Nicotine, Flavoring  Substance Use Topics   Alcohol use: Not Currently    Alcohol/week: 13.0 standard drinks of alcohol    Types: 6 Cans of beer, 7 Standard drinks  or equivalent per week    Comment: previously heavy drinker 2016. most recent 4 beer/ day drinker and none since 01-11-2020   Drug use: Yes    Types: Methamphetamines    Comment: states he uses 1 g every other day     Last Labs:  Admission on 01/17/2023  Component Date Value Ref Range Status   WBC 01/17/2023 15.8 (H)  4.0 - 10.5 K/uL Final   RBC 01/17/2023 5.37  4.22 - 5.81 MIL/uL Final   Hemoglobin 01/17/2023 14.8  13.0 - 17.0 g/dL Final   HCT 56/21/3086 44.5  39.0 - 52.0 % Final   MCV 01/17/2023 82.9  80.0 - 100.0 fL Final   MCH 01/17/2023 27.6  26.0 - 34.0 pg Final   MCHC 01/17/2023 33.3  30.0 - 36.0 g/dL Final   RDW 57/84/6962 13.5  11.5 - 15.5 % Final   Platelets 01/17/2023 361  150 - 400 K/uL Final   nRBC 01/17/2023 0.0  0.0 - 0.2 % Final   Neutrophils Relative % 01/17/2023 76  % Final   Neutro Abs 01/17/2023 12.0 (H)  1.7 - 7.7 K/uL Final   Lymphocytes Relative 01/17/2023 13  % Final   Lymphs  Abs 01/17/2023 2.1  0.7 - 4.0 K/uL Final   Monocytes Relative 01/17/2023 10  % Final   Monocytes Absolute 01/17/2023 1.6 (H)  0.1 - 1.0 K/uL Final   Eosinophils Relative 01/17/2023 1  % Final   Eosinophils Absolute 01/17/2023 0.1  0.0 - 0.5 K/uL Final   Basophils Relative 01/17/2023 0  % Final   Basophils Absolute 01/17/2023 0.1  0.0 - 0.1 K/uL Final   Immature Granulocytes 01/17/2023 0  % Final   Abs Immature Granulocytes 01/17/2023 0.06  0.00 - 0.07 K/uL Final   Performed at [facility], [address] [place], [place] [address]   Sodium 01/17/2023 138  135 - 145 mmol/L Final   Potassium 01/17/2023 4.5  3.5 - 5.1 mmol/L Final   Chloride 01/17/2023 102  98 - 111 mmol/L Final   CO2 01/17/2023 20 (L)  22 - 32 mmol/L Final   Glucose, Bld 01/17/2023 86  70 - 99 mg/dL Final   Glucose reference range applies only to samples taken after fasting for at least 8 hours.   BUN 01/17/2023 21 (H)  6 - 20 mg/dL Final   Creatinine, Ser 01/17/2023 1.24  0.61 - 1.24 mg/dL Final   Calcium 13/24/4010 9.8  8.9 - 10.3 mg/dL Final   Total Protein 27/25/3664 8.2 (H)  6.5 - 8.1 g/dL Final   Albumin 40/34/7425 4.4  3.5 - 5.0 g/dL Final   AST 95/63/8756 109 (H)  15 - 41 U/L Final   ALT 01/17/2023 81 (H)  0 - 44 U/L Final   Alkaline Phosphatase 01/17/2023 112  38 - 126 U/L Final   Total Bilirubin 01/17/2023 3.0 (H)  0.3 - 1.2 mg/dL Final   GFR, Estimated 01/17/2023 >60  >60 mL/min Final   Comment: (NOTE) Calculated using the CKD-EPI Creatinine Equation (2021)    Anion gap 01/17/2023 16 (H)  5 - 15 Final   Performed at [facility], [address] [place], [place] [address]   Hgb A1c MFr Bld 01/17/2023 7.2 (H)  4.8 - 5.6 % Final   Comment: (NOTE) Pre diabetes:          5.7%-6.4%  Diabetes:              >6.4%  Glycemic control for   <  7.0% adults with diabetes    Mean Plasma Glucose 01/17/2023 159.94  mg/dL Final   Performed at [facility], [address], [place], [place] [address]    Alcohol, Ethyl (B) 01/17/2023 <10  <10 mg/dL Final   Comment: (NOTE) Lowest detectable limit for serum alcohol is 10 mg/dL.  For medical purposes only. Performed at [facility], [address] [place], [place] [address]    POC Amphetamine UR 01/17/2023 Positive (A)  NONE DETECTED (Cut Off Level 1000 ng/mL) Final   POC Secobarbital (BAR) 01/17/2023 None Detected  NONE DETECTED (Cut Off Level 300 ng/mL) Final   POC Buprenorphine (BUP) 01/17/2023 None Detected  NONE DETECTED (Cut Off Level 10 ng/mL) Final   POC Oxazepam (BZO) 01/17/2023 Positive (A)  NONE DETECTED (Cut Off Level 300 ng/mL) Final   POC Cocaine UR 01/17/2023 None Detected  NONE DETECTED (Cut Off Level 300 ng/mL) Final   POC Methamphetamine UR 01/17/2023 Positive (A)  NONE DETECTED (Cut Off Level 1000 ng/mL) Final   POC Morphine 01/17/2023 Positive (A)  NONE DETECTED (Cut Off Level 300 ng/mL) Final   POC Methadone UR 01/17/2023 Positive (A)  NONE DETECTED (Cut Off Level 300 ng/mL) Final   POC Oxycodone UR 01/17/2023 Positive (A)  NONE DETECTED (Cut Off Level 100 ng/mL) Final   POC Marijuana UR 01/17/2023 Positive (A)  NONE DETECTED (Cut Off Level 50 ng/mL) Final   Cholesterol 01/17/2023 136  0 - 200 mg/dL Final   Triglycerides 56/43/3295 101  <150 mg/dL Final   HDL 18/84/1660 37 (L)  >40 mg/dL Final   Total CHOL/HDL Ratio 01/17/2023 3.7  RATIO Final   VLDL 01/17/2023 20  0 - 40 mg/dL Final   LDL Cholesterol 01/17/2023 79  0 - 99 mg/dL Final   Comment:        Total Cholesterol/HDL:CHD Risk Coronary Heart Disease Risk Table                     Men   Women  1/2 Average Risk   3.4   3.3  Average Risk       5.0   4.4  2 X Average Risk   9.6   7.1  3 X Average Risk  23.4   11.0        Use the calculated Patient Ratio above and the CHD Risk Table to determine the patient's CHD Risk.        ATP III CLASSIFICATION (LDL):  <100     mg/dL   Optimal  630-160  mg/dL   Near or Above                    Optimal  130-159   mg/dL   Borderline  109-323  mg/dL   High  >557     mg/dL   Very High Performed at [facility], [address] [place], [place] [address]    TSH 01/17/2023 6.861 (H)  0.350 - 4.500 uIU/mL Final   Comment: Performed by a 3rd Generation assay with a functional sensitivity of <=0.01 uIU/mL. Performed at [facility], [address], [place], [place] [address]   Admission on 01/14/2023, Discharged on 01/15/2023  Component Date Value Ref Range Status   WBC 01/14/2023 15.8 (H)  4.0 - 10.5 K/uL Final   RBC 01/14/2023 5.64  4.22 - 5.81 MIL/uL Final   Hemoglobin 01/14/2023 15.6  13.0 - 17.0 g/dL Final   HCT 62/37/6283  47.6  39.0 - 52.0 % Final   MCV 01/14/2023 84.4  80.0 - 100.0 fL Final   MCH 01/14/2023 27.7  26.0 - 34.0 pg Final   MCHC 01/14/2023 32.8  30.0 - 36.0 g/dL Final   RDW 16/01/9603 14.0  11.5 - 15.5 % Final   Platelets 01/14/2023 301  150 - 400 K/uL Final   nRBC 01/14/2023 0.0  0.0 - 0.2 % Final   Performed at [facility], [address] [place], [place] [address]   Color, Urine 01/15/2023 YELLOW  YELLOW Final   APPearance 01/15/2023 CLEAR  CLEAR Final   Specific Gravity, Urine 01/15/2023 1.022  1.005 - 1.030 Final   pH 01/15/2023 5.0  5.0 - 8.0 Final   Glucose, UA 01/15/2023 >=500 (A)  NEGATIVE mg/dL Final   Hgb urine dipstick 01/15/2023 MODERATE (A)  NEGATIVE Final   Bilirubin Urine 01/15/2023 NEGATIVE  NEGATIVE Final   Ketones, ur 01/15/2023 20 (A)  NEGATIVE mg/dL Final   Protein, ur 11/91/4782 100 (A)  NEGATIVE mg/dL Final   Nitrite 95/62/1308 NEGATIVE  NEGATIVE Final   Leukocytes,Ua 01/15/2023 NEGATIVE  NEGATIVE Final   RBC / HPF 01/15/2023 0-5  0 - 5 RBC/hpf Final   WBC, UA 01/15/2023 0-5  0 - 5 WBC/hpf Final   Bacteria, UA 01/15/2023 RARE (A)  NONE SEEN Final   Squamous Epithelial / HPF 01/15/2023 0-5  0 - 5 /HPF Final   Mucus 01/15/2023 PRESENT   Final   Hyaline Casts, UA 01/15/2023 PRESENT   Final   Performed at [facility], [address], [place], [place] [address]   Sodium 01/14/2023 139  135 - 145 mmol/L Final   Potassium 01/14/2023 3.7  3.5 - 5.1 mmol/L Final   Chloride 01/14/2023 104  98 - 111 mmol/L Final   CO2 01/14/2023 24  22 - 32 mmol/L Final   Glucose, Bld 01/14/2023 170 (H)  70 - 99 mg/dL Final   Glucose reference range applies only to samples taken after fasting for at least 8 hours.   BUN 01/14/2023 17  6 - 20 mg/dL Final   Creatinine, Ser 01/14/2023 0.99  0.61 - 1.24 mg/dL Final   Calcium 69/62/9528 9.3  8.9 - 10.3 mg/dL Final   Total Protein 41/32/4401 8.6 (H)  6.5 - 8.1 g/dL Final   Albumin 02/72/5366 4.6  3.5 - 5.0 g/dL Final   AST 44/06/4740 68 (H)  15 - 41 U/L Final   ALT 01/14/2023 66 (H)  0 - 44 U/L Final   Alkaline Phosphatase 01/14/2023 101  38 - 126 U/L Final   Total Bilirubin 01/14/2023 1.4 (H)  0.3 - 1.2 mg/dL Final   GFR, Estimated 01/14/2023 >60  >60 mL/min Final   Comment: (NOTE) Calculated using the CKD-EPI Creatinine Equation (2021)    Anion gap 01/14/2023 11  5 - 15 Final   Performed at [facility], [address] [place], [place] [address]   Opiates 01/15/2023 NONE DETECTED  NONE DETECTED Final   Cocaine 01/15/2023 NONE DETECTED  NONE DETECTED Final   Benzodiazepines 01/15/2023 POSITIVE (A)  NONE DETECTED Final   Amphetamines 01/15/2023 POSITIVE (A)  NONE DETECTED Final   Tetrahydrocannabinol 01/15/2023 NONE DETECTED  NONE DETECTED Final   Barbiturates 01/15/2023 NONE DETECTED  NONE DETECTED Final   Comment: (NOTE) DRUG SCREEN FOR MEDICAL PURPOSES ONLY.  IF CONFIRMATION IS NEEDED FOR ANY PURPOSE, NOTIFY LAB WITHIN 5 DAYS.  LOWEST DETECTABLE LIMITS FOR URINE DRUG SCREEN  Drug Class                     Cutoff (ng/mL) Amphetamine and metabolites    1000 Barbiturate and metabolites    200 Benzodiazepine                 200 Opiates and metabolites        300 Cocaine and metabolites        300 THC                             50 Performed at [facility], [address], [place], [place] [address]    Lipase 01/14/2023 19  11 - 51 U/L Final   Performed at [facility], [address], [place], [place] [address]    Allergies: Latex  Medications:  Facility Ordered Medications  Medication   acetaminophen (TYLENOL) tablet 650 mg   alum & mag hydroxide-simeth (MAALOX/MYLANTA) 200-200-20 MG/5ML suspension 30 mL   magnesium hydroxide (MILK OF MAGNESIA) suspension 30 mL   hydrOXYzine (ATARAX) tablet 25 mg   traZODone (DESYREL) tablet 50 mg   PTA Medications  Medication Sig   hydrOXYzine (ATARAX) 50 MG tablet Take 1 tablet (50 mg total) by mouth every 6 (six) hours as needed for anxiety.   nicotine (NICODERM CQ - DOSED IN MG/24 HOURS) 14 mg/24hr patch Place 1 patch (14 mg total) onto the skin daily. (Patient not taking: Reported on 01/15/2023)   QUEtiapine (SEROQUEL) 50 MG tablet Take 3 tablets (150 mg total) by mouth at bedtime.   lamoTRIgine (LAMICTAL) 25 MG tablet Take 2 tablets (50 mg total) by mouth daily.   albuterol (VENTOLIN HFA) 108 (90 Base) MCG/ACT inhaler Inhale 2 puffs into the lungs every 6 (six) hours as needed for shortness of breath.   hydrocortisone cream 0.5 % Apply topically 2 (two) times daily. (Patient not taking: Reported on 01/15/2023)   hydrOXYzine (VISTARIL) 50 MG capsule Take 50 mg by mouth daily as needed.   metFORMIN (GLUCOPHAGE-XR) 500 MG 24 hr tablet Take 1 tablet by mouth daily with breakfast. (Patient not taking: Reported on 01/15/2023)      Medical Decision Making  Patient is recommended for inpatient psychiatric treatment for mood stabilization and detox.      Recommendations  Based on my evaluation the patient does not appear to have an emergency medical condition.  Maricela Bo, NP 01/17/23  7:17 AM

## 2023-01-17 NOTE — ED Notes (Signed)
Patient alert and oriented.  Denies SI, HI, AVH, and pain. Pt is irritable and says staff is talking about him and a school situation.  Asked pt for more information, he became irritable and said that "girl there."  No one in area. Scheduled medications administered to patient, per MD orders. Support and encouragement provided.  Routine safety checks conducted every hour.  Patient informed to notify staff with problems or concerns. No adverse drug reactions noted. Patient contracts for safety at this time.  Patient appears agitated and paranoid. Patient has poor interacts  with others on the unit.  Patient remains safe at this time.

## 2023-01-17 NOTE — ED Notes (Signed)
Patient has been given night time medication, patient is lying in bed quietly sleeping, no distress noted, will continue to monitor patient for safety.

## 2023-01-17 NOTE — ED Notes (Signed)
Per lab T3 can not be an add on lab.  Attempted to draw lab from two locations.  Unable to get blood at this time.

## 2023-01-17 NOTE — ED Triage Notes (Signed)
Patient presents to [facility] voluntarily. 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 patient reports SI with a plan to overdose on Meth. Patient denies HI at this time. Patient reports visual hallucinations and reports "seeing things that are not there but they are clearing up." Patient reports using Meth at approximately 10pm tonight. Patient is routine.

## 2023-01-17 NOTE — Progress Notes (Signed)
   01/17/23 0353  [facility] Triage Screening (Walk-ins at [facility] only)  How Did You Hear About [place]? Self  What Is the Reason for Your Visit/Call Today? Patient presents to [facility] voluntarily. 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 patient reports SI with a plan to overdose on Meth. Patient denies HI at this time. Patient reports visual hallucinations and reports "seeing things that are not there but they are clearing up." Patient reports using Meth at approximately 10pm tonight. Patient is routine.  How Long Has This Been Causing You Problems? > than 6 months  Have You Recently Had Any Thoughts About Hurting Yourself? Yes  How long ago did you have thoughts about hurting yourself? Tonight  Are You Planning to Commit Suicide/Harm Yourself At This time? Yes  Have you Recently Had Thoughts About Hurting Someone [name]? No  Are You Planning To Harm Someone At This Time? No  Are you currently experiencing any auditory, visual or other hallucinations? Yes  Please explain the hallucinations you are currently experiencing: visual Patient reports seeing thing that are not there but reprots it has cleared up.  Have You Used Any Alcohol or Drugs in the Past 24 Hours? Yes  How long ago did you use Drugs or Alcohol? Approx 10pm  What Did You Use and How Much? mETH 1/2 A GRAM  Do you have any current medical co-morbidities that require immediate attention? Yes  Please describe current medical co-morbidities that require immediate attention: Nerve damage in feet.  Clinician description of patient physical appearance/behavior: Patient presents fairly groomed wit flat affect.  What Do You Feel Would Help You the Most Today? Alcohol or Drug Use Treatment;Housing Assistance;Social Support  If access to [facility] was not available, would you have sought care in the Emergency Department? Yes  Determination of Need Routine (7 days)  Options For Referral [facility]  Urgent Care

## 2023-01-17 NOTE — ED Notes (Signed)
Pt awake and asking for snack.  Snack provided.
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
## 2023-01-17 NOTE — ED Notes (Signed)
Patient showered and oriented to unit - denies sxs of distress - will continue to monitor for safety

## 2023-01-17 NOTE — ED Notes (Signed)
Pt in bathroom he is calm and cooperative he has been giving a snack no pain or distress noted will denies SI/HI/AVH will continue to monitor for safety

## 2023-01-18 ENCOUNTER — Other Ambulatory Visit: Payer: Self-pay

## 2023-01-18 ENCOUNTER — Inpatient Hospital Stay
Admission: AD | Admit: 2023-01-18 | Discharge: 2023-02-02 | DRG: 897 | Disposition: A | Discharge status: Home / Self Care | Payer: MEDICAID | Source: Intra-hospital | Attending: Psychiatry | Admitting: Psychiatry

## 2023-01-18 ENCOUNTER — Encounter: Payer: Self-pay | Admitting: Registered Nurse

## 2023-01-18 DIAGNOSIS — Z751 Person awaiting admission to adequate facility elsewhere: Secondary | ICD-10-CM

## 2023-01-18 DIAGNOSIS — F17290 Nicotine dependence, other tobacco product, uncomplicated: Secondary | ICD-10-CM | POA: Diagnosis present

## 2023-01-18 DIAGNOSIS — Z7984 Long term (current) use of oral hypoglycemic drugs: Secondary | ICD-10-CM | POA: Diagnosis not present

## 2023-01-18 DIAGNOSIS — G4700 Insomnia, unspecified: Secondary | ICD-10-CM | POA: Diagnosis present

## 2023-01-18 DIAGNOSIS — F1994 Other psychoactive substance use, unspecified with psychoactive substance-induced mood disorder: Secondary | ICD-10-CM | POA: Diagnosis present

## 2023-01-18 DIAGNOSIS — Z5986 Financial insecurity: Secondary | ICD-10-CM | POA: Diagnosis not present

## 2023-01-18 DIAGNOSIS — Z9151 Personal history of suicidal behavior: Secondary | ICD-10-CM

## 2023-01-18 DIAGNOSIS — Z555 Less than a high school diploma: Secondary | ICD-10-CM | POA: Diagnosis not present

## 2023-01-18 DIAGNOSIS — Z5941 Food insecurity: Secondary | ICD-10-CM

## 2023-01-18 DIAGNOSIS — Z825 Family history of asthma and other chronic lower respiratory diseases: Secondary | ICD-10-CM

## 2023-01-18 DIAGNOSIS — Z91040 Latex allergy status: Secondary | ICD-10-CM

## 2023-01-18 DIAGNOSIS — Z79899 Other long term (current) drug therapy: Secondary | ICD-10-CM | POA: Diagnosis not present

## 2023-01-18 DIAGNOSIS — F17210 Nicotine dependence, cigarettes, uncomplicated: Secondary | ICD-10-CM | POA: Diagnosis present

## 2023-01-18 DIAGNOSIS — R45851 Suicidal ideations: Secondary | ICD-10-CM | POA: Diagnosis present

## 2023-01-18 DIAGNOSIS — Z803 Family history of malignant neoplasm of breast: Secondary | ICD-10-CM

## 2023-01-18 DIAGNOSIS — F4310 Post-traumatic stress disorder, unspecified: Secondary | ICD-10-CM | POA: Diagnosis present

## 2023-01-18 DIAGNOSIS — Z8249 Family history of ischemic heart disease and other diseases of the circulatory system: Secondary | ICD-10-CM

## 2023-01-18 DIAGNOSIS — G479 Sleep disorder, unspecified: Secondary | ICD-10-CM | POA: Diagnosis present

## 2023-01-18 DIAGNOSIS — F419 Anxiety disorder, unspecified: Secondary | ICD-10-CM | POA: Diagnosis present

## 2023-01-18 MED ORDER — METHOCARBAMOL 500 MG PO TABS
500.0000 mg | ORAL_TABLET | Freq: Three times a day (TID) | ORAL | Status: AC | PRN
  Administered 2023-01-20: 500 mg via ORAL
  Filled 2023-01-18: qty 1

## 2023-01-18 MED ORDER — HYDROCORTISONE 1 % EX CREA
TOPICAL_CREAM | Freq: Three times a day (TID) | CUTANEOUS | Status: DC | PRN
  Filled 2023-01-18: qty 28

## 2023-01-18 MED ORDER — NAPROXEN 500 MG PO TABS
500.0000 mg | ORAL_TABLET | Freq: Two times a day (BID) | ORAL | Status: AC | PRN
  Administered 2023-01-20 – 2023-01-21 (×2): 500 mg via ORAL
  Filled 2023-01-18 (×2): qty 1

## 2023-01-18 MED ORDER — ALUM & MAG HYDROXIDE-SIMETH 200-200-20 MG/5ML PO SUSP: 30.0000 mL | ORAL | Status: DC | PRN

## 2023-01-18 MED ORDER — DIPHENHYDRAMINE HCL 25 MG PO CAPS: 50.0000 mg | ORAL_CAPSULE | Freq: Three times a day (TID) | ORAL | Status: DC | PRN

## 2023-01-18 MED ORDER — NICOTINE 14 MG/24HR TD PT24
14.0000 mg | MEDICATED_PATCH | Freq: Every day | TRANSDERMAL | Status: DC
  Filled 2023-01-18 (×2): qty 1

## 2023-01-18 MED ORDER — HYDROXYZINE HCL 25 MG PO TABS: 25.0000 mg | ORAL_TABLET | Freq: Four times a day (QID) | ORAL | Status: AC | PRN

## 2023-01-18 MED ORDER — DICYCLOMINE HCL 20 MG PO TABS
20.0000 mg | ORAL_TABLET | Freq: Four times a day (QID) | ORAL | Status: AC | PRN
  Administered 2023-01-20: 20 mg via ORAL
  Filled 2023-01-18 (×2): qty 1

## 2023-01-18 MED ORDER — LOPERAMIDE HCL 2 MG PO CAPS
2.0000 mg | ORAL_CAPSULE | ORAL | Status: AC | PRN
  Administered 2023-01-20: 2 mg via ORAL
  Filled 2023-01-18: qty 1

## 2023-01-18 MED ORDER — DIPHENHYDRAMINE HCL 50 MG/ML IJ SOLN: 50.0000 mg | Freq: Three times a day (TID) | INTRAMUSCULAR | Status: DC | PRN

## 2023-01-18 MED ORDER — TRAZODONE HCL 50 MG PO TABS
50.0000 mg | ORAL_TABLET | Freq: Every evening | ORAL | Status: DC | PRN
  Administered 2023-01-18 – 2023-02-01 (×13): 50 mg via ORAL
  Filled 2023-01-18 (×14): qty 1

## 2023-01-18 MED ORDER — ONDANSETRON 4 MG PO TBDP: 4.0000 mg | ORAL_TABLET | Freq: Four times a day (QID) | ORAL | Status: AC | PRN

## 2023-01-18 MED ORDER — QUETIAPINE FUMARATE 25 MG PO TABS
150.0000 mg | ORAL_TABLET | Freq: Every day | ORAL | Status: DC
  Administered 2023-01-18 – 2023-02-01 (×15): 150 mg via ORAL
  Filled 2023-01-18 (×3): qty 2
  Filled 2023-01-18: qty 1
  Filled 2023-01-18 (×15): qty 2
  Filled 2023-01-18: qty 1

## 2023-01-18 MED ORDER — MAGNESIUM HYDROXIDE 400 MG/5ML PO SUSP: 30.0000 mL | Freq: Every day | ORAL | Status: DC | PRN

## 2023-01-18 MED ORDER — ACETAMINOPHEN 325 MG PO TABS
650.0000 mg | ORAL_TABLET | Freq: Four times a day (QID) | ORAL | Status: DC | PRN
  Administered 2023-01-22 – 2023-02-01 (×6): 650 mg via ORAL
  Filled 2023-01-18 (×7): qty 2

## 2023-01-18 NOTE — Group Note (Signed)
Date:  01/18/2023 Time:  6:43 PM  Group Topic/Focus:  the use of time outdoors in a manner designed for therapeutic refreshment of one's body or mind.  Music Therapy and Outdoor Recreation.    Participation Level:  Did Not Attend   Nathan Koch 01/18/2023, 6:43 PM

## 2023-01-18 NOTE — ED Notes (Signed)
report called to [facility] and safe transport called.

## 2023-01-18 NOTE — Progress Notes (Signed)
Pt was accepted to [facility] TODAY 01/18/2023, pending voluntary consent. Bed assignment: 304  Pt meets inpatient criteria per Assunta Found, NP  Attending Physician will be Lewanda Rife, MD  Report can be called to: [phone]  Pt can arrive after pending items are received  Care Team Notified: Rona Ravens, RN, Arnette Felts, RN, Harless Litten, RN, Assunta Found, NP, and Horton Marshall, RN  [place], [place]  01/18/2023 1:27 PM

## 2023-01-18 NOTE — Group Note (Signed)
Date:  01/18/2023 Time:  9:51 PM  Group Topic/Focus:  Personal Choices and Values:   The focus of this group is to help patients assess and explore the importance of values in their lives, how their values affect their decisions, how they express their values and what opposes their expression.    Participation Level:  Active  Participation Quality:  Appropriate and Attentive  Affect:  Appropriate  Cognitive:  Alert and Appropriate  Insight: Appropriate and Good  Engagement in Group:  Developing/Improving  Modes of Intervention:  Clarification, Discussion, Education, and Support  Additional Comments:     Nathan Koch 01/18/2023, 9:51 PM

## 2023-01-18 NOTE — Discharge Instructions (Signed)
Patient to be transferred to [facility] for inpatient psychiatric treatment

## 2023-01-18 NOTE — Progress Notes (Signed)
32 years old male patient admitted for suicidal ideations. Patient got a long history of substance abuse and PTSD. Patient pleasant and cooperative with admission assessment. Patient states his goal is " I need to be back on my mental health and on my medicines." Patient denies SI,HI and AVH at this time. Skin assessment and body search done. No contraband found.Oriented to unit and made comfortable in room. Support and encouragement given.
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
## 2023-01-18 NOTE — Progress Notes (Signed)
Pt calm and pleasant during assessment denying SI/HI/AVH. Pt stated he wanted to sleep tonight. Pt compliant with medication administration per MD orders. Pt given education, support, and encouragement to be active in his treatment plan. Pt being monitored Q 15 minutes for safety per unit protocol, remains safe on the unit

## 2023-01-18 NOTE — Tx Team (Signed)
Initial Treatment Plan 01/18/2023 5:42 PM Nathan Koch [id number]    PATIENT STRESSORS: Financial difficulties   Medication change or noncompliance   Substance abuse   Traumatic event     PATIENT STRENGTHS: Average or above average intelligence  Communication skills  Physical Health    PATIENT IDENTIFIED PROBLEMS: Substance abuse  Suicidal ideations  PTSD                 DISCHARGE CRITERIA:  Medical problems require only outpatient monitoring Verbal commitment to aftercare and medication compliance Withdrawal symptoms are absent or subacute and managed without 24-hour nursing intervention  PRELIMINARY DISCHARGE PLAN: Attend aftercare/continuing care group Return to previous living arrangement  PATIENT/FAMILY INVOLVEMENT: This treatment plan has been presented to and reviewed with the patient, Nathan Koch, and/or family member.  The patient and family have been given the opportunity to ask questions and make suggestions.  Leonarda Salon, RN 01/18/2023, 5:42 PM
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
## 2023-01-19 DIAGNOSIS — F1994 Other psychoactive substance use, unspecified with psychoactive substance-induced mood disorder: Secondary | ICD-10-CM

## 2023-01-19 LAB — PROLACTIN: Prolactin: 10.1 ng/mL (ref 3.9–22.7)

## 2023-01-19 MED ORDER — FLUOXETINE HCL 20 MG PO CAPS
20.0000 mg | ORAL_CAPSULE | Freq: Every day | ORAL | Status: DC
  Administered 2023-01-19 – 2023-02-02 (×15): 20 mg via ORAL
  Filled 2023-01-19 (×15): qty 1

## 2023-01-19 MED ORDER — ALBUTEROL SULFATE HFA 108 (90 BASE) MCG/ACT IN AERS: 2.0000 | INHALATION_SPRAY | RESPIRATORY_TRACT | Status: DC

## 2023-01-19 MED ORDER — ALBUTEROL SULFATE HFA 108 (90 BASE) MCG/ACT IN AERS
2.0000 | INHALATION_SPRAY | RESPIRATORY_TRACT | Status: DC | PRN
  Administered 2023-01-19 – 2023-01-28 (×4): 2 via RESPIRATORY_TRACT
  Filled 2023-01-19: qty 6.7

## 2023-01-19 MED ORDER — RISPERIDONE 1 MG PO TABS
0.5000 mg | ORAL_TABLET | ORAL | Status: DC
  Administered 2023-01-19 – 2023-02-02 (×29): 0.5 mg via ORAL
  Filled 2023-01-19 (×29): qty 1

## 2023-01-19 NOTE — H&P (Signed)
Psychiatric Admission Assessment Adult  Patient Identification: Nathan Koch MRN:  [id number] Date of Evaluation:  01/19/2023 Chief Complaint:  Other psychoactive substance-induced mood disorder ([name]) [F19.94] Principal Diagnosis: Other psychoactive substance-induced mood disorder ([name]) Diagnosis:  Principal Problem:   Other psychoactive substance-induced mood disorder ([name])  History of Present Illness: Nathan Koch is a 32 year old male who was voluntarily admitted to inpatient psychiatry for suicidal ideation and depression.  He is currently residing at the [facility] but left after 1 day and started using methamphetamine.  He has been having suicidal thoughts for the past week.  He endorses anhedonia, difficulty sleeping, depressed mood and suicidal thoughts.  He has had 1 suicide attempt in the past.  He has been admitted to [facility] in the past and been treated by  [facility] outpatient.  He is originally from [place]. Associated Signs/Symptoms: Depression Symptoms:  depressed mood, insomnia, (Hypo) Manic Symptoms:  Irritable Mood, Anxiety Symptoms:  Excessive Worry, Psychotic Symptoms:  Hallucinations: Auditory PTSD Symptoms: NA Total Time spent with patient: 1 hour  Past Psychiatric History: 1 previous admission at [facility] and [facility]  Is the patient at risk to self? Yes.    Has the patient been a risk to self in the past 6 months? Yes.    Has the patient been a risk to self within the distant past? Yes.    Is the patient a risk to others? No.  Has the patient been a risk to others in the past 6 months? No.  Has the patient been a risk to others within the distant past? No.   [place] Scale:  Flowsheet Row Admission (Current) from 01/18/2023 in [facility] INPATIENT BEHAVIORAL MEDICINE ED from 01/17/2023 in [facility] ED from 01/14/2023 in [facility] Emergency Department at [facility]  C-SSRS RISK CATEGORY Low Risk Moderate Risk Low Risk         Prior Inpatient Therapy: Yes.   If yes, describe as above Prior Outpatient Therapy: Yes.   If yes, describe as above  Alcohol Screening: 1. How often do you have a drink containing alcohol?: Monthly or less 2. How many drinks containing alcohol do you have on a typical day when you are drinking?: 1 or 2 3. How often do you have six or more drinks on one occasion?: Never AUDIT-C Score: 1 4. How often during the last year have you found that you were not able to stop drinking once you had started?: Never 5. How often during the last year have you failed to do what was normally expected from you because of drinking?: Never 6. How often during the last year have you needed a first drink in the morning to get yourself going after a heavy drinking session?: Never 7. How often during the last year have you had a feeling of guilt of remorse after drinking?: Never 8. How often during the last year have you been unable to remember what happened the night before because you had been drinking?: Never 9. Have you or someone else been injured as a result of your drinking?: No 10. Has a relative or friend or a doctor or another health worker been concerned about your drinking or suggested you cut down?: No Alcohol Use Disorder Identification Test Final Score (AUDIT): 1 Alcohol Brief Interventions/Follow-up: Alcohol education/Brief advice Substance Abuse History in the last 12 months:  Yes.   Consequences of Substance Abuse: NA Previous Psychotropic Medications: Yes  Psychological Evaluations: Yes  Past Medical History:  Past Medical History:  Diagnosis Date   Anxiety    Anxiety disorder    Asthma    Bipolar 1 disorder ([name])    Depression    Methamphetamine abuse ([name])    PTSD (post-traumatic stress disorder)     Past Surgical History:  Procedure Laterality Date   CLOSED REDUCTION MANDIBLE N/A 07/02/2018   Procedure: CLOSED REDUCTION MANDIBULAR;  Surgeon: Vernie Murders, MD;  Location: [facility]  ORS;  Service: ENT;  Laterality: N/A;   Family History:  Family History  Problem Relation Age of Onset   Asthma Mother    Cancer Mother        breast cancer   Ulcers Mother    Cancer Father        esophogeal cancer   Hypertension Father    Asthma Brother    Family Psychiatric  History: Unremarkable Tobacco Screening:  Social History   Tobacco Use  Smoking Status Every Day   Current packs/day: 0.50   Average packs/day: 0.5 packs/day for 8.0 years (4.0 ttl pk-yrs)   Types: Cigarettes  Smokeless Tobacco Never    BH Tobacco Counseling     Are you interested in Tobacco Cessation Medications?  Yes, implement Nicotene Replacement Protocol Counseled patient on smoking cessation:  Yes Reason Tobacco Screening Not Completed: No value filed.       Social History:  Social History   Substance and Sexual Activity  Alcohol Use Not Currently   Alcohol/week: 13.0 standard drinks of alcohol   Types: 6 Cans of beer, 7 Standard drinks or equivalent per week   Comment: previously heavy drinker 2016. most recent 4 beer/ day drinker and none since 01-11-2020     Social History   Substance and Sexual Activity  Drug Use Yes   Types: Methamphetamines   Comment: states he uses 1 g every other day    Additional Social History:                           Allergies:   Allergies  Allergen Reactions   Latex Hives   Lab Results: No results found for this or any previous visit (from the past 48 hour(s)).  Blood Alcohol level:  Lab Results  Component Value Date   ETH <10 01/17/2023   ETH <10 12/29/2021    Metabolic Disorder Labs:  Lab Results  Component Value Date   HGBA1C 7.2 (H) 01/17/2023   MPG 159.94 01/17/2023   MPG 126 01/17/2021   Lab Results  Component Value Date   PROLACTIN 10.1 01/17/2023   Lab Results  Component Value Date   CHOL 136 01/17/2023   TRIG 101 01/17/2023   HDL 37 (L) 01/17/2023   CHOLHDL 3.7 01/17/2023   VLDL 20 01/17/2023   LDLCALC  79 01/17/2023   LDLCALC 46 01/17/2021    Current Medications: Current Facility-Administered Medications  Medication Dose Route Frequency Provider Last Rate Last Admin   acetaminophen (TYLENOL) tablet 650 mg  650 mg Oral Q6H PRN Rankin, Shuvon B, NP       alum & mag hydroxide-simeth (MAALOX/MYLANTA) 200-200-20 MG/5ML suspension 30 mL  30 mL Oral Q4H PRN Rankin, Shuvon B, NP       dicyclomine (BENTYL) tablet 20 mg  20 mg Oral Q6H PRN Rankin, Shuvon B, NP       diphenhydrAMINE (BENADRYL) capsule 50 mg  50 mg Oral TID PRN Rankin, Shuvon B, NP       Or   diphenhydrAMINE (BENADRYL)  injection 50 mg  50 mg Intramuscular TID PRN Rankin, Shuvon B, NP       hydrocortisone cream 1 %   Topical TID PRN Rankin, Shuvon B, NP       hydrOXYzine (ATARAX) tablet 25 mg  25 mg Oral Q6H PRN Rankin, Shuvon B, NP       loperamide (IMODIUM) capsule 2-4 mg  2-4 mg Oral PRN Rankin, Shuvon B, NP       magnesium hydroxide (MILK OF MAGNESIA) suspension 30 mL  30 mL Oral Daily PRN Rankin, Shuvon B, NP       methocarbamol (ROBAXIN) tablet 500 mg  500 mg Oral Q8H PRN Rankin, Shuvon B, NP       naproxen (NAPROSYN) tablet 500 mg  500 mg Oral BID PRN Rankin, Shuvon B, NP       nicotine (NICODERM CQ - dosed in mg/24 hours) patch 14 mg  14 mg Transdermal Daily Rankin, Shuvon B, NP       ondansetron (ZOFRAN-ODT) disintegrating tablet 4 mg  4 mg Oral Q6H PRN Rankin, Shuvon B, NP       QUEtiapine (SEROQUEL) tablet 150 mg  150 mg Oral QHS Rankin, Shuvon B, NP   150 mg at 01/18/23 2131   traZODone (DESYREL) tablet 50 mg  50 mg Oral QHS PRN Rankin, Shuvon B, NP   50 mg at 01/18/23 2131   PTA Medications: Medications Prior to Admission  Medication Sig Dispense Refill Last Dose   albuterol (VENTOLIN HFA) 108 (90 Base) MCG/ACT inhaler Inhale 2 puffs into the lungs every 6 (six) hours as needed for shortness of breath. 6.7 g 1    gabapentin (NEURONTIN) 400 MG capsule Take 400 mg by mouth 3 (three) times daily.      hydrOXYzine  (VISTARIL) 50 MG capsule Take 50 mg by mouth daily as needed.      lamoTRIgine (LAMICTAL) 25 MG tablet Take 2 tablets (50 mg total) by mouth daily. 60 tablet 1    metFORMIN (GLUCOPHAGE-XR) 500 MG 24 hr tablet Take 1 tablet by mouth daily with breakfast. (Patient not taking: Reported on 01/15/2023)      QUEtiapine Fumarate 150 MG TABS Take 1 tablet by mouth daily.       Musculoskeletal: Strength & Muscle Tone: within normal limits Gait & Station: normal Patient leans: N/A            Psychiatric Specialty Exam:  Presentation  General Appearance:  Appropriate for Environment  Eye Contact: Good  Speech: Clear and Coherent; Normal Rate  Speech Volume: Normal  Handedness: Right   Mood and Affect  Mood: Anxious; Depressed  Affect: Congruent; Depressed   Thought Process  Thought Processes: Coherent; Goal Directed  Duration of Psychotic Symptoms:N/A Past Diagnosis of Schizophrenia or Psychoactive disorder: No  Descriptions of Associations:Intact  Orientation:Full (Time, Place and Person)  Thought Content:Logical  Hallucinations:Hallucinations: None  Ideas of Reference:None  Suicidal Thoughts:Suicidal Thoughts: Yes, Active SI Active Intent and/or Plan: Without Plan; With Intent  Homicidal Thoughts:Homicidal Thoughts: No   Sensorium  Memory: Immediate Good; Recent Good  Judgment: Fair  Insight: Fair; Present   Executive Functions  Concentration: Good  Attention Span: Good  Recall: Good  Fund of Knowledge: Good  Language: Good   Psychomotor Activity  Psychomotor Activity: Psychomotor Activity: Normal   Assets  Assets: Communication Skills; Desire for Improvement; Physical Health; Resilience   Sleep  Sleep: Sleep: Fair    Physical Exam: Physical Exam Vitals and nursing note reviewed.  Constitutional:  Appearance: Normal appearance. He is normal weight.  HENT:     Head: Normocephalic and atraumatic.      Nose: Nose normal.     Mouth/Throat:     Pharynx: Oropharynx is clear.  Eyes:     Extraocular Movements: Extraocular movements intact.     Pupils: Pupils are equal, round, and reactive to light.  Cardiovascular:     Rate and Rhythm: Normal rate and regular rhythm.     Pulses: Normal pulses.     Heart sounds: Normal heart sounds.  Pulmonary:     Effort: Pulmonary effort is normal.     Breath sounds: Normal breath sounds.  Abdominal:     General: Abdomen is flat. Bowel sounds are normal.     Palpations: Abdomen is soft.  Genitourinary:    Penis: Normal.      Testes: Normal.     Rectum: Normal.  Musculoskeletal:        General: Normal range of motion.     Cervical back: Normal range of motion and neck supple.  Skin:    General: Skin is warm and dry.  Neurological:     General: No focal deficit present.     Mental Status: He is alert and oriented to person, place, and time.  Psychiatric:        Attention and Perception: Attention and perception normal.        Mood and Affect: Mood is depressed. Affect is flat.        Speech: Speech normal.        Behavior: Behavior normal. Behavior is cooperative.        Thought Content: Thought content is paranoid.        Cognition and Memory: Cognition and memory normal.        Judgment: Judgment is impulsive.    Review of Systems  Psychiatric/Behavioral:  Positive for depression and suicidal ideas. The patient has insomnia.    Blood pressure 131/79, pulse 89, temperature 98 F (36.7 C), temperature source Oral, resp. rate 20, height 5\' 9"  (1.753 m), weight 105.2 kg, SpO2 98%. Body mass index is 34.26 kg/m.  Treatment Plan Summary: Daily contact with patient to assess and evaluate symptoms and progress in treatment, Medication management, and Plan continue Seroquel.  Start Risperdal 0.5 twice a day and Prozac 20 mg/day.  Trazodone as needed  Observation Level/Precautions:  15 minute checks  Laboratory:  CBC Chemistry Profile   Psychotherapy:    Medications:    Consultations:    Discharge Concerns:    Estimated LOS:  Other:     Physician Treatment Plan for Primary Diagnosis: Other psychoactive substance-induced mood disorder ([name]) Long Term Goal(s): Improvement in symptoms so as ready for discharge  Short Term Goals: Ability to identify changes in lifestyle to reduce recurrence of condition will improve, Ability to verbalize feelings will improve, Ability to disclose and discuss suicidal ideas, Ability to demonstrate self-control will improve, Ability to identify and develop effective coping behaviors will improve, Ability to maintain clinical measurements within normal limits will improve, Compliance with prescribed medications will improve, and Ability to identify triggers associated with substance abuse/mental health issues will improve  Physician Treatment Plan for Secondary Diagnosis: Principal Problem:   Other psychoactive substance-induced mood disorder ([name])   I certify that inpatient services furnished can reasonably be expected to improve the patient's condition.    Sarina Ill, DO 9/24/202411:44 AM

## 2023-01-19 NOTE — Progress Notes (Signed)
   01/19/23 0800  Clinical Opiate Withdrawal Scale (COWS)  Resting Pulse Rate 1  Sweating 0  Restlessness 0  Pupil Size 0  Bone or Joint Aches 1  Runny Nose or Tearing 1  GI Upset 2  Tremor 0  Yawning 1  Anxiety or Irritability 0  Gooseflesh Skin 0  COWS Total Score 6

## 2023-01-19 NOTE — Group Note (Signed)
Date:  01/19/2023 Time:  9:01 PM  Group Topic/Focus:  Goals Group:   The focus of this group is to help patients establish daily goals to achieve during treatment and discuss how the patient can incorporate goal setting into their daily lives to aide in recovery.    Participation Level:  Active  Participation Quality:  Appropriate, Attentive, Sharing, and Supportive  Affect:  Appropriate  Cognitive:  Appropriate  Insight: Appropriate and Good  Engagement in Group:  Engaged and Supportive  Modes of Intervention:  Discussion and Support  Additional Comments:     Belva Crome 01/19/2023, 9:01 PM

## 2023-01-19 NOTE — Group Note (Signed)
Date:  01/19/2023 Time:  10:07 AM  Group Topic/Focus:  Developing a Wellness Toolbox:   The focus of this group is to help patients develop a "wellness toolbox" with skills and strategies to promote recovery upon discharge.    Participation Level:  Did Not Attend   Nathan Koch 01/19/2023, 10:07 AM

## 2023-01-19 NOTE — Plan of Care (Signed)
D- Patient alert and oriented. Patient presented in a sullen, but pleasant mood on assessment stating that he slept ok last night and had complaints of left hip pain. Patient rated his pain a "5/10", in which he did not request any pain medication to help with relief. Patient denied anxiety, but endorsed "a little bit" of depression, stating that "the situation I'm in" is why he's feeling this way. Patient also denied SI, HI, AVH at this time. Patient had no stated goals for today.  A- Scheduled medications administered to patient, per MD orders. Support and encouragement provided.  Routine safety checks conducted every 15 minutes.  Patient informed to notify staff with problems or concerns.  R- No adverse drug reactions noted. Patient contracts for safety at this time. Patient compliant with medications and treatment plan. Patient receptive, calm, and cooperative. Patient interacts well with others on the unit.  Patient remains safe at this time.  Problem: Education: Goal: Knowledge of General Education information will improve Description: Including pain rating scale, medication(s)/side effects and non-pharmacologic comfort measures Outcome: Not Progressing   Problem: Health Behavior/Discharge Planning: Goal: Ability to manage health-related needs will improve Outcome: Not Progressing   Problem: Clinical Measurements: Goal: Ability to maintain clinical measurements within normal limits will improve Outcome: Not Progressing Goal: Will remain free from infection Outcome: Not Progressing Goal: Diagnostic test results will improve Outcome: Not Progressing Goal: Respiratory complications will improve Outcome: Not Progressing Goal: Cardiovascular complication will be avoided Outcome: Not Progressing   Problem: Activity: Goal: Risk for activity intolerance will decrease Outcome: Not Progressing   Problem: Nutrition: Goal: Adequate nutrition will be maintained Outcome: Not Progressing    Problem: Coping: Goal: Level of anxiety will decrease Outcome: Not Progressing   Problem: Elimination: Goal: Will not experience complications related to bowel motility Outcome: Not Progressing Goal: Will not experience complications related to urinary retention Outcome: Not Progressing   Problem: Pain Managment: Goal: General experience of comfort will improve Outcome: Not Progressing   Problem: Safety: Goal: Ability to remain free from injury will improve Outcome: Not Progressing   Problem: Skin Integrity: Goal: Risk for impaired skin integrity will decrease Outcome: Not Progressing   Problem: Education: Goal: Knowledge of [place] [facility] Education information/materials will improve Outcome: Not Progressing Goal: Emotional status will improve Outcome: Not Progressing Goal: Mental status will improve Outcome: Not Progressing Goal: Verbalization of understanding the information provided will improve Outcome: Not Progressing   Problem: Activity: Goal: Interest or engagement in activities will improve Outcome: Not Progressing Goal: Sleeping patterns will improve Outcome: Not Progressing   Problem: Coping: Goal: Ability to verbalize frustrations and anger appropriately will improve Outcome: Not Progressing Goal: Ability to demonstrate self-control will improve Outcome: Not Progressing   Problem: Health Behavior/Discharge Planning: Goal: Identification of resources available to assist in meeting health care needs will improve Outcome: Not Progressing Goal: Compliance with treatment plan for underlying cause of condition will improve Outcome: Not Progressing   Problem: Physical Regulation: Goal: Ability to maintain clinical measurements within normal limits will improve Outcome: Not Progressing   Problem: Safety: Goal: Periods of time without injury will increase Outcome: Not Progressing   Problem: Education: Goal: Knowledge of [place] [facility] Education  information/materials will improve Outcome: Not Progressing Goal: Emotional status will improve Outcome: Not Progressing Goal: Mental status will improve Outcome: Not Progressing Goal: Verbalization of understanding the information provided will improve Outcome: Not Progressing   Problem: Coping: Goal: Ability to verbalize frustrations and anger appropriately will improve Outcome: Not  Progressing Goal: Ability to demonstrate self-control will improve Outcome: Not Progressing   Problem: Health Behavior/Discharge Planning: Goal: Identification of resources available to assist in meeting health care needs will improve Outcome: Not Progressing Goal: Compliance with treatment plan for underlying cause of condition will improve Outcome: Not Progressing   Problem: Physical Regulation: Goal: Ability to maintain clinical measurements within normal limits will improve Outcome: Not Progressing   Problem: Safety: Goal: Periods of time without injury will increase Outcome: Not Progressing

## 2023-01-19 NOTE — BHH Suicide Risk Assessment (Signed)
[facility] Admission Suicide Risk Assessment   Nursing information obtained from:  Patient Demographic factors:  Male, Unemployed Current Mental Status:  Suicidal ideation indicated by patient Loss Factors:  Financial problems / change in socioeconomic status Historical Factors:  Impulsivity Risk Reduction Factors:  Responsible for children under 31 years of age  Total Time spent with patient: 1 hour Principal Problem: Other psychoactive substance-induced mood disorder ([name]) Diagnosis:  Principal Problem:   Other psychoactive substance-induced mood disorder ([name])  Subjective Data: Patient presents to [facility] voluntarily. 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 patient reports SI with a plan to overdose on Meth. Patient denies HI at this time. Patient reports visual hallucinations and reports "seeing things that are not there but they are clearing up."   Continued Clinical Symptoms:  Alcohol Use Disorder Identification Test Final Score (AUDIT): 1 The "Alcohol Use Disorders Identification Test", Guidelines for Use in Primary Care, Second Edition.  World Science writer [facility]). Score between 0-7:  no or low risk or alcohol related problems. Score between 8-15:  moderate risk of alcohol related problems. Score between 16-19:  high risk of alcohol related problems. Score 20 or above:  warrants further diagnostic evaluation for alcohol dependence and treatment.   CLINICAL FACTORS:   Depression:   Anhedonia Alcohol/Substance Abuse/Dependencies   Musculoskeletal: Strength & Muscle Tone: within normal limits Gait & Station: normal Patient leans: Right  Psychiatric Specialty Exam:  Presentation  General Appearance:  Appropriate for Environment  Eye Contact: Good  Speech: Clear and Coherent; Normal Rate  Speech Volume: Normal  Handedness: Right   Mood and Affect  Mood: Anxious; Depressed  Affect: Congruent;  Depressed   Thought Process  Thought Processes: Coherent; Goal Directed  Descriptions of Associations:Intact  Orientation:Full (Time, Place and Person)  Thought Content:Logical  History of Schizophrenia/Schizoaffective disorder:No  Duration of Psychotic Symptoms:N/A  Hallucinations:Hallucinations: None  Ideas of Reference:None  Suicidal Thoughts:Suicidal Thoughts: Yes, Active SI Active Intent and/or Plan: Without Plan; With Intent  Homicidal Thoughts:Homicidal Thoughts: No   Sensorium  Memory: Immediate Good; Recent Good  Judgment: Fair  Insight: Fair; Present   Executive Functions  Concentration: Good  Attention Span: Good  Recall: Good  Fund of Knowledge: Good  Language: Good   Psychomotor Activity  Psychomotor Activity: Psychomotor Activity: Normal   Assets  Assets: Communication Skills; Desire for Improvement; Physical Health; Resilience   Sleep  Sleep: Sleep: Fair     Blood pressure 131/79, pulse 89, temperature 98 F (36.7 C), temperature source Oral, resp. rate 20, height 5\' 9"  (1.753 m), weight 105.2 kg, SpO2 98%. Body mass index is 34.26 kg/m.   COGNITIVE FEATURES THAT CONTRIBUTE TO RISK:  Thought constriction (tunnel vision)    SUICIDE RISK:   Minimal: No identifiable suicidal ideation.  Patients presenting with no risk factors but with morbid ruminations; may be classified as minimal risk based on the severity of the depressive symptoms  PLAN OF CARE: See orders  I certify that inpatient services furnished can reasonably be expected to improve the patient's condition.   Sarina Ill, DO 01/19/2023, 11:43 AM

## 2023-01-19 NOTE — Group Note (Signed)
Recreation Therapy Group Note   Group Topic:Goal Setting  Group Date: 01/19/2023 Start Time: 1000 End Time: 1100 Facilitators: Rosina Lowenstein, LRT, CTRS Location:  Craft Room  Group Description: Vision Board. Patients were given many different magazines, a glue stick, markers, and a piece of cardstock paper. LRT and pts discussed the importance of having goals in life. LRT and pts discussed the difference between short-term and long-term goals, as well as what a SMART goal is. LRT encouraged pts to create a vision board, with images they picked and then cut out with safety scissors from the magazine, for themselves, that capture their short and long-term goals. LRT encouraged pts to show and explain their vision board to the group.   Goal Area(s) Addressed:  Patient will gain knowledge of short vs. long term goals.  Patient will identify goals for themselves. Patient will practice setting SMART goals. Patient will verbalize their goals to LRT and peers.   Affect/Mood: N/A   Participation Level: Did not attend    Clinical Observations/Individualized Feedback: Mikey did not attend group.  Plan: Continue to engage patient in RT group sessions 2-3x/week.   Rosina Lowenstein, LRT, CTRS 01/19/2023 11:40 AM

## 2023-01-19 NOTE — Group Note (Signed)
Date:  01/19/2023 Time:  4:30 PM  Group Topic/Focus:  Outdoor recreation structured therapy group.    Participation Level:  Did Not Attend   Nathan Koch 01/19/2023, 4:30 PM

## 2023-01-19 NOTE — Progress Notes (Signed)
Pt calm and pleasant during assessment denying SI/HI/AVH. Pt stated he wanted to sleep tonight. Pt compliant with medication administration per MD orders. Pt given education, support, and encouragement to be active in his treatment plan. Pt being monitored Q 15 minutes for safety per unit protocol, remains safe on the unit
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
## 2023-01-19 NOTE — Progress Notes (Addendum)
Patient was asleep during the time of assessment and did not appear to be in any signs of distress nor discomfort. This Clinical research associate will reassess once patient wakes up for dinner.   01/19/23 1600  Clinical Opiate Withdrawal Scale (COWS)  Resting Pulse Rate 0  Sweating 0  Restlessness 0  Pupil Size 0  Bone or Joint Aches 0  Runny Nose or Tearing 0  GI Upset 0  Tremor 0  Yawning 0  Anxiety or Irritability 0  Gooseflesh Skin 0  COWS Total Score 0

## 2023-01-19 NOTE — Plan of Care (Signed)
Pt denies SI/HI/AVH, compliant with procedures on the unit  Problem: Education: Goal: Knowledge of General Education information will improve Description: Including pain rating scale, medication(s)/side effects and non-pharmacologic comfort measures Outcome: Progressing   Problem: Health Behavior/Discharge Planning: Goal: Ability to manage health-related needs will improve Outcome: Progressing   Problem: Clinical Measurements: Goal: Ability to maintain clinical measurements within normal limits will improve Outcome: Progressing Goal: Will remain free from infection Outcome: Progressing Goal: Diagnostic test results will improve Outcome: Progressing Goal: Respiratory complications will improve Outcome: Progressing Goal: Cardiovascular complication will be avoided Outcome: Progressing   Problem: Activity: Goal: Risk for activity intolerance will decrease Outcome: Progressing   Problem: Nutrition: Goal: Adequate nutrition will be maintained Outcome: Progressing   Problem: Coping: Goal: Level of anxiety will decrease Outcome: Progressing   Problem: Elimination: Goal: Will not experience complications related to bowel motility Outcome: Progressing Goal: Will not experience complications related to urinary retention Outcome: Progressing   Problem: Pain Managment: Goal: General experience of comfort will improve Outcome: Progressing   Problem: Safety: Goal: Ability to remain free from injury will improve Outcome: Progressing   Problem: Skin Integrity: Goal: Risk for impaired skin integrity will decrease Outcome: Progressing   Problem: Education: Goal: Knowledge of [place] [facility] Education information/materials will improve Outcome: Progressing Goal: Emotional status will improve Outcome: Progressing Goal: Mental status will improve Outcome: Progressing Goal: Verbalization of understanding the information provided will improve Outcome: Progressing    Problem: Activity: Goal: Interest or engagement in activities will improve Outcome: Progressing Goal: Sleeping patterns will improve Outcome: Progressing   Problem: Coping: Goal: Ability to verbalize frustrations and anger appropriately will improve Outcome: Progressing Goal: Ability to demonstrate self-control will improve Outcome: Progressing   Problem: Health Behavior/Discharge Planning: Goal: Identification of resources available to assist in meeting health care needs will improve Outcome: Progressing Goal: Compliance with treatment plan for underlying cause of condition will improve Outcome: Progressing   Problem: Physical Regulation: Goal: Ability to maintain clinical measurements within normal limits will improve Outcome: Progressing   Problem: Safety: Goal: Periods of time without injury will increase Outcome: Progressing   Problem: Education: Goal: Knowledge of [place] [facility] Education information/materials will improve Outcome: Progressing Goal: Emotional status will improve Outcome: Progressing Goal: Mental status will improve Outcome: Progressing Goal: Verbalization of understanding the information provided will improve Outcome: Progressing   Problem: Coping: Goal: Ability to verbalize frustrations and anger appropriately will improve Outcome: Progressing Goal: Ability to demonstrate self-control will improve Outcome: Progressing   Problem: Health Behavior/Discharge Planning: Goal: Identification of resources available to assist in meeting health care needs will improve Outcome: Progressing Goal: Compliance with treatment plan for underlying cause of condition will improve Outcome: Progressing   Problem: Physical Regulation: Goal: Ability to maintain clinical measurements within normal limits will improve Outcome: Progressing   Problem: Safety: Goal: Periods of time without injury will increase Outcome: Progressing

## 2023-01-20 DIAGNOSIS — F1994 Other psychoactive substance use, unspecified with psychoactive substance-induced mood disorder: Secondary | ICD-10-CM | POA: Diagnosis not present

## 2023-01-20 NOTE — BHH Counselor (Signed)
CSW faxed referral for treatment to [facility], [place] (Fax: [phone])   Reynaldo Minium, MSW, [facility] 01/20/2023 2:19 PM

## 2023-01-20 NOTE — BH IP Treatment Plan (Signed)
Interdisciplinary Treatment and Diagnostic Plan Update  01/20/2023 Time of Session: 9:30 AM  Nathan Koch MRN: [id number]  Principal Diagnosis: Other psychoactive substance-induced mood disorder ([name])  Secondary Diagnoses: Principal Problem:   Other psychoactive substance-induced mood disorder ([name])   Current Medications:  Current Facility-Administered Medications  Medication Dose Route Frequency Provider Last Rate Last Admin   acetaminophen (TYLENOL) tablet 650 mg  650 mg Oral Q6H PRN Rankin, Shuvon B, NP       albuterol (VENTOLIN HFA) 108 (90 Base) MCG/ACT inhaler 2 puff  2 puff Inhalation Q4H PRN Sarina Ill, DO   2 puff at 01/19/23 2101   alum & mag hydroxide-simeth (MAALOX/MYLANTA) 200-200-20 MG/5ML suspension 30 mL  30 mL Oral Q4H PRN Rankin, Shuvon B, NP       dicyclomine (BENTYL) tablet 20 mg  20 mg Oral Q6H PRN Rankin, Shuvon B, NP   20 mg at 01/20/23 0851   diphenhydrAMINE (BENADRYL) capsule 50 mg  50 mg Oral TID PRN Rankin, Shuvon B, NP       Or   diphenhydrAMINE (BENADRYL) injection 50 mg  50 mg Intramuscular TID PRN Rankin, Shuvon B, NP       FLUoxetine (PROZAC) capsule 20 mg  20 mg Oral Daily Sarina Ill, DO   20 mg at 01/20/23 0847   hydrocortisone cream 1 %   Topical TID PRN Rankin, Shuvon B, NP       hydrOXYzine (ATARAX) tablet 25 mg  25 mg Oral Q6H PRN Rankin, Shuvon B, NP       loperamide (IMODIUM) capsule 2-4 mg  2-4 mg Oral PRN Rankin, Shuvon B, NP   2 mg at 01/20/23 0851   magnesium hydroxide (MILK OF MAGNESIA) suspension 30 mL  30 mL Oral Daily PRN Rankin, Shuvon B, NP       methocarbamol (ROBAXIN) tablet 500 mg  500 mg Oral Q8H PRN Rankin, Shuvon B, NP   500 mg at 01/20/23 0851   naproxen (NAPROSYN) tablet 500 mg  500 mg Oral BID PRN Rankin, Shuvon B, NP   500 mg at 01/20/23 0851   nicotine (NICODERM CQ - dosed in mg/24 hours) patch 14 mg  14 mg Transdermal Daily Rankin, Shuvon B, NP       ondansetron (ZOFRAN-ODT) disintegrating tablet 4  mg  4 mg Oral Q6H PRN Rankin, Shuvon B, NP       QUEtiapine (SEROQUEL) tablet 150 mg  150 mg Oral QHS Rankin, Shuvon B, NP   150 mg at 01/19/23 2101   risperiDONE (RISPERDAL) tablet 0.5 mg  0.5 mg Oral BH-q8a4p Sarina Ill, DO   0.5 mg at 01/20/23 0847   traZODone (DESYREL) tablet 50 mg  50 mg Oral QHS PRN Rankin, Shuvon B, NP   50 mg at 01/19/23 2101   PTA Medications: Medications Prior to Admission  Medication Sig Dispense Refill Last Dose   albuterol (VENTOLIN HFA) 108 (90 Base) MCG/ACT inhaler Inhale 2 puffs into the lungs every 6 (six) hours as needed for shortness of breath. 6.7 g 1    gabapentin (NEURONTIN) 400 MG capsule Take 400 mg by mouth 3 (three) times daily.      hydrOXYzine (VISTARIL) 50 MG capsule Take 50 mg by mouth daily as needed.      lamoTRIgine (LAMICTAL) 25 MG tablet Take 2 tablets (50 mg total) by mouth daily. 60 tablet 1    metFORMIN (GLUCOPHAGE-XR) 500 MG 24 hr tablet Take 1 tablet by mouth daily with breakfast. (  Patient not taking: Reported on 01/15/2023)      QUEtiapine Fumarate 150 MG TABS Take 1 tablet by mouth daily.       Patient Stressors: Financial difficulties   Medication change or noncompliance   Substance abuse   Traumatic event    Patient Strengths: Average or above average intelligence  Communication skills  Physical Health   Treatment Modalities: Medication Management, Group therapy, Case management,  1 to 1 session with clinician, Psychoeducation, Recreational therapy.   Physician Treatment Plan for Primary Diagnosis: Other psychoactive substance-induced mood disorder ([name]) Long Term Goal(s): Improvement in symptoms so as ready for discharge   Short Term Goals: Ability to identify changes in lifestyle to reduce recurrence of condition will improve Ability to verbalize feelings will improve Ability to disclose and discuss suicidal ideas Ability to demonstrate self-control will improve Ability to identify and develop effective  coping behaviors will improve Ability to maintain clinical measurements within normal limits will improve Compliance with prescribed medications will improve Ability to identify triggers associated with substance abuse/mental health issues will improve  Medication Management: Evaluate patient's response, side effects, and tolerance of medication regimen.  Therapeutic Interventions: 1 to 1 sessions, Unit Group sessions and Medication administration.  Evaluation of Outcomes: Progressing  Physician Treatment Plan for Secondary Diagnosis: Principal Problem:   Other psychoactive substance-induced mood disorder ([name])  Long Term Goal(s): Improvement in symptoms so as ready for discharge   Short Term Goals: Ability to identify changes in lifestyle to reduce recurrence of condition will improve Ability to verbalize feelings will improve Ability to disclose and discuss suicidal ideas Ability to demonstrate self-control will improve Ability to identify and develop effective coping behaviors will improve Ability to maintain clinical measurements within normal limits will improve Compliance with prescribed medications will improve Ability to identify triggers associated with substance abuse/mental health issues will improve     Medication Management: Evaluate patient's response, side effects, and tolerance of medication regimen.  Therapeutic Interventions: 1 to 1 sessions, Unit Group sessions and Medication administration.  Evaluation of Outcomes: Progressing   RN Treatment Plan for Primary Diagnosis: Other psychoactive substance-induced mood disorder ([name]) Long Term Goal(s): Knowledge of disease and therapeutic regimen to maintain health will improve  Short Term Goals: Ability to remain free from injury will improve, Ability to verbalize frustration and anger appropriately will improve, Ability to demonstrate self-control, Ability to participate in decision making will improve, Ability to  verbalize feelings will improve, Ability to disclose and discuss suicidal ideas, Ability to identify and develop effective coping behaviors will improve, and Compliance with prescribed medications will improve  Medication Management: RN will administer medications as ordered by provider, will assess and evaluate patient's response and provide education to patient for prescribed medication. RN will report any adverse and/or side effects to prescribing provider.  Therapeutic Interventions: 1 on 1 counseling sessions, Psychoeducation, Medication administration, Evaluate responses to treatment, Monitor vital signs and CBGs as ordered, Perform/monitor CIWA, COWS, AIMS and Fall Risk screenings as ordered, Perform wound care treatments as ordered.  Evaluation of Outcomes: Progressing   LCSW Treatment Plan for Primary Diagnosis: Other psychoactive substance-induced mood disorder ([name]) Long Term Goal(s): Safe transition to appropriate next level of care at discharge, Engage patient in therapeutic group addressing interpersonal concerns.  Short Term Goals: Engage patient in aftercare planning with referrals and resources, Increase social support, Increase ability to appropriately verbalize feelings, Increase emotional regulation, Facilitate acceptance of mental health diagnosis and concerns, Facilitate patient progression through stages of change regarding substance  use diagnoses and concerns, Identify triggers associated with mental health/substance abuse issues, and Increase skills for wellness and recovery  Therapeutic Interventions: Assess for all discharge needs, 1 to 1 time with Social worker, Explore available resources and support systems, Assess for adequacy in community support network, Educate family and significant other(s) on suicide prevention, Complete Psychosocial Assessment, Interpersonal group therapy.  Evaluation of Outcomes: Progressing   Progress in Treatment: Attending groups: Yes.  and No. Participating in groups: Yes. and No. Taking medication as prescribed: Yes. Toleration medication: Yes. Family/Significant other contact made: No, will contact:  CSW will contact if given permission  Patient understands diagnosis: Yes. Discussing patient identified problems/goals with staff: Yes. Medical problems stabilized or resolved: Yes. Denies suicidal/homicidal ideation: No. Issues/concerns per patient self-inventory: No. Other: None   New problem(s) identified: No, Describe:  None identified   New Short Term/Long Term Goal(s): detox, elimination of symptoms of psychosis, medication management for mood stabilization; elimination of SI thoughts; development of comprehensive mental wellness/sobriety plan.   Patient Goals:  " I want treatment"   Discharge Plan or Barriers: CSW will assist with appropriate discharge planning   Reason for Continuation of Hospitalization: Depression Medication stabilization Suicidal ideation  Estimated Length of Stay: 1 to 7 days   Last 3 [place] Suicide Severity Risk Score: Flowsheet Row Admission (Current) from [facility]/18/2023 [facility] ED from 01/17/2023 in [facility] ED from [facility]/14/2023 in [facility] Department at [facility]  C-SSRS RISK CATEGORY Low Risk Moderate Risk Low Risk       Last [facility] 2/9 Scores:    04/15/2021    2:16 AM 02/05/2021    4:55 AM 01/15/2021    8:04 AM  Depression screen PHQ 2/9  Decreased Interest 1 2 1   Down, Depressed, Hopeless 1 2 1   PHQ - 2 Score 2 4 2   Altered sleeping 2 0 0  Tired, decreased energy 2 1 0  Change in appetite 2 0 0  Feeling bad or failure about yourself  2 1 2   Trouble concentrating 2 1 0  Moving slowly or fidgety/restless 2 0   Suicidal thoughts 2 2 2   PHQ-9 Score 16 9 6   Difficult doing work/chores Very difficult Extremely dIfficult     Scribe for Treatment Team: [name] Rafter,  Theresia Majors 01/20/2023 9:51 AM

## 2023-01-20 NOTE — Progress Notes (Signed)
D- Patient alert and oriented. Patient presented in a pleasant mood on assessment reporting that he slept "good" last night and had complaints of muscle aches and pains, related to withdrawals. Patient rated his pain a "5/10", in which he was given PRN medication to help with relief. Patient endorsed depression, hopelessness, and passive SI on his self-inventory, stating that his situation has him feeling this way. Patient denied HI/AVH at this time. Patient's goal for today, per his self-inventory, is "my mental health", in which he will "work hard on the problem", in order to achieve his goal. Patient stated during treatment team meeting that he wants rehab for follow-up treatment.  A- Scheduled medications administered to patient, per MD orders. Support and encouragement provided. Routine safety checks conducted every 15 minutes. Patient informed to notify staff with problems or concerns.  R- No adverse drug reactions noted. Patient contracts for safety at this time. Patient compliant with medications and treatment plan. Patient receptive, calm, and cooperative. Patient interacts well with others on the unit. Patient remains safe at this time.   01/20/23 1400  Psych Admission Type (Psych Patients Only)  Admission Status Voluntary  Psychosocial Assessment  Patient Complaints Anxiety;Depression;Hopelessness;Self-harm thoughts  Eye Contact Fair  Facial Expression Sad  Affect Sullen  Speech Logical/coherent  Interaction Assertive  Motor Activity Slow  Appearance/Hygiene Disheveled;Poor hygiene  Behavior Characteristics Cooperative;Appropriate to situation  Mood Pleasant  Aggressive Behavior  Effect No apparent injury  Thought Process  Coherency Circumstantial  Content Blaming self  Delusions None reported or observed  Perception WDL  Hallucination None reported or observed  Judgment WDL  Confusion None  Danger to Self  Current suicidal ideation? Passive  Self-Injurious Behavior Some  self-injurious ideation observed or expressed.  No lethal plan expressed   Agreement Not to Harm Self Yes  Description of Agreement Verbal  Danger to Others  Danger to Others None reported or observed

## 2023-01-20 NOTE — Progress Notes (Signed)
   01/20/23 0800  Clinical Opiate Withdrawal Scale (COWS)  Resting Pulse Rate 1  Sweating 0  Restlessness 0  Pupil Size 0  Bone or Joint Aches 2  Runny Nose or Tearing 0  GI Upset 1  Tremor 0  Yawning 0  Anxiety or Irritability 0  Gooseflesh Skin 0  COWS Total Score 4

## 2023-01-20 NOTE — Group Note (Signed)
Date:  01/20/2023 Time:  4:17 PM  Group Topic/Focus:  Outdoor Recreation/Activity    Participation Level:  Did Not Attend   Lynelle Smoke [facility] 01/20/2023, 4:17 PM

## 2023-01-20 NOTE — Progress Notes (Signed)
[facility] MD Progress Note  01/20/2023 1:15 PM MILLS MCKAIG  MRN:  [id number] Subjective: Nathan Koch is seen on rounds.  He is doing well on his medications.  He would like to go to drug and alcohol rehab when he is discharged.  Discussed psychiatric follow-up.  He is doing well on Prozac and Seroquel.  He denies any side effects.  States he slept well.  He has been pleasant and cooperative.  Nurses report no issues. Principal Problem: Other psychoactive substance-induced mood disorder ([name]) Diagnosis: Principal Problem:   Other psychoactive substance-induced mood disorder ([name])  Total Time spent with patient: 15 minutes  Past Psychiatric History: Depression and polysubstance abuse  Past Medical History:  Past Medical History:  Diagnosis Date   Anxiety    Anxiety disorder    Asthma    Bipolar 1 disorder ([name])    Depression    Methamphetamine abuse ([name])    PTSD (post-traumatic stress disorder)     Past Surgical History:  Procedure Laterality Date   CLOSED REDUCTION MANDIBLE N/A 07/02/2018   Procedure: CLOSED REDUCTION MANDIBULAR;  Surgeon: Vernie Murders, MD;  Location: [facility] ORS;  Service: ENT;  Laterality: N/A;   Family History:  Family History  Problem Relation Age of Onset   Asthma Mother    Cancer Mother        breast cancer   Ulcers Mother    Cancer Father        esophogeal cancer   Hypertension Father    Asthma Brother    Family Psychiatric  History: Unremarkable Social History:  Social History   Substance and Sexual Activity  Alcohol Use Not Currently   Alcohol/week: 13.0 standard drinks of alcohol   Types: 6 Cans of beer, 7 Standard drinks or equivalent per week   Comment: previously heavy drinker 2016. most recent 4 beer/ day drinker and none since 01-11-2020     Social History   Substance and Sexual Activity  Drug Use Yes   Types: Methamphetamines   Comment: states he uses 1 g every other day    Social History   Socioeconomic History   Marital status:  Single    Spouse name: Not on file   Number of children: 1   Years of education: 10   Highest education level: 10th grade  Occupational History   Not on file  Tobacco Use   Smoking status: Every Day    Current packs/day: 0.50    Average packs/day: 0.5 packs/day for 8.0 years (4.0 ttl pk-yrs)    Types: Cigarettes   Smokeless tobacco: Never  Vaping Use   Vaping status: Every Day   Substances: Nicotine, Flavoring  Substance and Sexual Activity   Alcohol use: Not Currently    Alcohol/week: 13.0 standard drinks of alcohol    Types: 6 Cans of beer, 7 Standard drinks or equivalent per week    Comment: previously heavy drinker 2016. most recent 4 beer/ day drinker and none since 01-11-2020   Drug use: Yes    Types: Methamphetamines    Comment: states he uses 1 g every other day   Sexual activity: Yes  Other Topics Concern   Not on file  Social History Narrative   Not on file   Social Determinants of Health   Financial Resource Strain: High Risk (05/05/2022)   [facility], [facility]   Overall Financial Resource Strain (CARDIA)    Difficulty of Paying Living Expenses: Very hard  Food Insecurity: Food  Insecurity Present (01/18/2023)   Hunger Vital Sign    Worried About Running Out of Food in the Last Year: Sometimes true    Ran Out of Food in the Last Year: Sometimes true  Transportation Needs: No Transportation Needs (01/18/2023)   PRAPARE - Administrator, Civil Service (Medical): No    Lack of Transportation (Non-Medical): No  Physical Activity: Not on file  Stress: Not on file  Social Connections: Unknown (09/09/2021)   [facility] from [facility], [facility]   Social Network    Social Network: Not on file   Additional Social History:                         Sleep: Good  Appetite:  Good  Current Medications: Current Facility-Administered Medications  Medication Dose Route Frequency Provider  Last Rate Last Admin   acetaminophen (TYLENOL) tablet 650 mg  650 mg Oral Q6H PRN Rankin, Shuvon B, NP       albuterol (VENTOLIN HFA) 108 (90 Base) MCG/ACT inhaler 2 puff  2 puff Inhalation Q4H PRN Sarina Ill, DO   2 puff at 01/19/23 2101   alum & mag hydroxide-simeth (MAALOX/MYLANTA) 200-200-20 MG/5ML suspension 30 mL  30 mL Oral Q4H PRN Rankin, Shuvon B, NP       dicyclomine (BENTYL) tablet 20 mg  20 mg Oral Q6H PRN Rankin, Shuvon B, NP   20 mg at 01/20/23 0851   diphenhydrAMINE (BENADRYL) capsule 50 mg  50 mg Oral TID PRN Rankin, Shuvon B, NP       Or   diphenhydrAMINE (BENADRYL) injection 50 mg  50 mg Intramuscular TID PRN Rankin, Shuvon B, NP       FLUoxetine (PROZAC) capsule 20 mg  20 mg Oral Daily Sarina Ill, DO   20 mg at 01/20/23 0847   hydrocortisone cream 1 %   Topical TID PRN Rankin, Shuvon B, NP       hydrOXYzine (ATARAX) tablet 25 mg  25 mg Oral Q6H PRN Rankin, Shuvon B, NP       loperamide (IMODIUM) capsule 2-4 mg  2-4 mg Oral PRN Rankin, Shuvon B, NP   2 mg at 01/20/23 0851   magnesium hydroxide (MILK OF MAGNESIA) suspension 30 mL  30 mL Oral Daily PRN Rankin, Shuvon B, NP       methocarbamol (ROBAXIN) tablet 500 mg  500 mg Oral Q8H PRN Rankin, Shuvon B, NP   500 mg at 01/20/23 0851   naproxen (NAPROSYN) tablet 500 mg  500 mg Oral BID PRN Rankin, Shuvon B, NP   500 mg at 01/20/23 0851   ondansetron (ZOFRAN-ODT) disintegrating tablet 4 mg  4 mg Oral Q6H PRN Rankin, Shuvon B, NP       QUEtiapine (SEROQUEL) tablet 150 mg  150 mg Oral QHS Rankin, Shuvon B, NP   150 mg at 01/19/23 2101   risperiDONE (RISPERDAL) tablet 0.5 mg  0.5 mg Oral BH-q8a4p Sarina Ill, DO   0.5 mg at 01/20/23 0847   traZODone (DESYREL) tablet 50 mg  50 mg Oral QHS PRN Rankin, Shuvon B, NP   50 mg at 01/19/23 2101    Lab Results: No results found for this or any previous visit (from the past 48 hour(s)).  Blood Alcohol level:  Lab Results  Component Value Date   [facility] <10  01/17/2023   ETH <10 12/29/2021    Metabolic Disorder Labs: Lab Results  Component Value Date  HGBA1C 7.2 (H) 01/17/2023   MPG 159.94 01/17/2023   MPG 126 01/17/2021   Lab Results  Component Value Date   PROLACTIN 10.1 01/17/2023   Lab Results  Component Value Date   CHOL 136 01/17/2023   TRIG 101 01/17/2023   HDL 37 (L) 01/17/2023   CHOLHDL 3.7 01/17/2023   VLDL 20 01/17/2023   LDLCALC 79 01/17/2023   LDLCALC 46 01/17/2021    Physical Findings: AIMS:  , ,  ,  ,    CIWA:    COWS:  COWS Total Score: 4  Musculoskeletal: Strength & Muscle Tone: within normal limits Gait & Station: normal Patient leans: N/A  Psychiatric Specialty Exam:  Presentation  General Appearance:  Appropriate for Environment  Eye Contact: Good  Speech: Clear and Coherent; Normal Rate  Speech Volume: Normal  Handedness: Right   Mood and Affect  Mood: Anxious; Depressed  Affect: Congruent; Depressed   Thought Process  Thought Processes: Coherent; Goal Directed  Descriptions of Associations:Intact  Orientation:Full (Time, Place and Person)  Thought Content:Logical  History of Schizophrenia/Schizoaffective disorder:No  Duration of Psychotic Symptoms:N/A  Hallucinations:No data recorded Ideas of Reference:None  Suicidal Thoughts:No data recorded Homicidal Thoughts:No data recorded  Sensorium  Memory: Immediate Good; Recent Good  Judgment: Fair  Insight: Fair; Present   Executive Functions  Concentration: Good  Attention Span: Good  Recall: Good  Fund of Knowledge: Good  Language: Good   Psychomotor Activity  Psychomotor Activity:No data recorded  Assets  Assets: Communication Skills; Desire for Improvement; Physical Health; Resilience   Sleep  Sleep:No data recorded    Blood pressure 106/74, pulse 86, temperature 98.1 F (36.7 C), temperature source Oral, resp. rate 19, height 5\' 9"  (1.753 m), weight 105.2 kg, SpO2 97%. Body  mass index is 34.26 kg/m.   Treatment Plan Summary: Daily contact with patient to assess and evaluate symptoms and progress in treatment, Medication management, and Plan continue current medications.  Keandrea Tapley Tresea Mall, DO 01/20/2023, 1:15 PM

## 2023-01-20 NOTE — Group Note (Signed)
Recreation Therapy Group Note   Group Topic:Relaxation  Group Date: 01/20/2023 Start Time: 1000 End Time: 1050 Facilitators: Rosina Lowenstein, LRT, CTRS Location:  Craft Room  Group Description: PMR (Progressive Muscle Relaxation). LRT asks patients their current level of stress/anxiety from 1-10, with 10 being the highest. LRT educates patients on what PMR is and the benefits that come from it. Patients are asked to sit with their feet flat on the floor while sitting up and all the way back in their chair, if possible. LRT and pts follow a prompt through a speaker that requires you to tense and release different muscles in their body and focus on their breathing. During session, lights are off and soft music is being played. Pts are given a stress ball to use if needed. At the end of the prompt, LRT asks patients to rank their current levels of stress/anxiety from 1-10, 10 being the highest. LRT provided pts with an information handout on PMR.   Goal Area(s) Addressed:  Patients will be able to describe progressive muscle relaxation.  Patient will practice using relaxation technique. Patient will identify a new coping skill.  Patient will follow multistep directions to reduce anxiety and stress.   Affect/Mood: Appropriate   Participation Level: Active and Engaged   Participation Quality: Independent   Behavior: Appropriate, Calm, and Cooperative   Speech/Thought Process: Coherent   Insight: Good   Judgement: Good   Modes of Intervention: Activity   Patient Response to Interventions:  Attentive, Engaged, Interested , and Receptive   Education Outcome:  Acknowledges education   Clinical Observations/Individualized Feedback: Bright was active in their participation of session activities and group discussion. Pt identified that his anxiety was 0 and stress was a 7 before the session. Afterwards, he rated his anxiety a 0 and stress a 4. Pt completed all exercises appropriately.     Plan: Continue to engage patient in RT group sessions 2-3x/week.   Rosina Lowenstein, LRT, CTRS 01/20/2023 11:37 AM

## 2023-01-20 NOTE — Group Note (Signed)
Date:  01/20/2023 Time:  9:56 AM  Group Topic/Focus:  Goals Group:   The focus of this group is to help patients establish daily goals to achieve during treatment and discuss how the patient can incorporate goal setting into their daily lives to aide in recovery.    Participation Level:  Active  Participation Quality:  Appropriate  Affect:  Appropriate  Cognitive:  Appropriate  Insight: Appropriate  Engagement in Group:  Engaged  Modes of Intervention:  Discussion, Education, and Support  Additional Comments:    Wilford Corner 01/20/2023, 9:56 AM

## 2023-01-20 NOTE — Plan of Care (Signed)
Pt denied SI/HI/AVH, compliant with medications and procedures on the unit  Problem: Education: Goal: Knowledge of General Education information will improve Description: Including pain rating scale, medication(s)/side effects and non-pharmacologic comfort measures Outcome: Progressing   Problem: Health Behavior/Discharge Planning: Goal: Ability to manage health-related needs will improve Outcome: Progressing   Problem: Clinical Measurements: Goal: Ability to maintain clinical measurements within normal limits will improve Outcome: Progressing Goal: Will remain free from infection Outcome: Progressing Goal: Diagnostic test results will improve Outcome: Progressing Goal: Respiratory complications will improve Outcome: Progressing Goal: Cardiovascular complication will be avoided Outcome: Progressing   Problem: Activity: Goal: Risk for activity intolerance will decrease Outcome: Progressing   Problem: Nutrition: Goal: Adequate nutrition will be maintained Outcome: Progressing   Problem: Coping: Goal: Level of anxiety will decrease Outcome: Progressing   Problem: Elimination: Goal: Will not experience complications related to bowel motility Outcome: Progressing Goal: Will not experience complications related to urinary retention Outcome: Progressing   Problem: Pain Managment: Goal: General experience of comfort will improve Outcome: Progressing   Problem: Safety: Goal: Ability to remain free from injury will improve Outcome: Progressing   Problem: Skin Integrity: Goal: Risk for impaired skin integrity will decrease Outcome: Progressing   Problem: Education: Goal: Knowledge of [place] [facility] Education information/materials will improve Outcome: Progressing Goal: Emotional status will improve Outcome: Progressing Goal: Mental status will improve Outcome: Progressing Goal: Verbalization of understanding the information provided will improve Outcome:  Progressing   Problem: Activity: Goal: Interest or engagement in activities will improve Outcome: Progressing Goal: Sleeping patterns will improve Outcome: Progressing   Problem: Coping: Goal: Ability to verbalize frustrations and anger appropriately will improve Outcome: Progressing Goal: Ability to demonstrate self-control will improve Outcome: Progressing   Problem: Health Behavior/Discharge Planning: Goal: Identification of resources available to assist in meeting health care needs will improve Outcome: Progressing Goal: Compliance with treatment plan for underlying cause of condition will improve Outcome: Progressing   Problem: Physical Regulation: Goal: Ability to maintain clinical measurements within normal limits will improve Outcome: Progressing   Problem: Safety: Goal: Periods of time without injury will increase Outcome: Progressing   Problem: Education: Goal: Knowledge of [place] [facility] Education information/materials will improve Outcome: Progressing Goal: Emotional status will improve Outcome: Progressing Goal: Mental status will improve Outcome: Progressing Goal: Verbalization of understanding the information provided will improve Outcome: Progressing   Problem: Coping: Goal: Ability to verbalize frustrations and anger appropriately will improve Outcome: Progressing Goal: Ability to demonstrate self-control will improve Outcome: Progressing   Problem: Health Behavior/Discharge Planning: Goal: Identification of resources available to assist in meeting health care needs will improve Outcome: Progressing Goal: Compliance with treatment plan for underlying cause of condition will improve Outcome: Progressing   Problem: Physical Regulation: Goal: Ability to maintain clinical measurements within normal limits will improve Outcome: Progressing   Problem: Safety: Goal: Periods of time without injury will increase Outcome: Progressing

## 2023-01-20 NOTE — Progress Notes (Signed)
Patient is asleep at the moment. Patient does not appear to be in any acute distress.   01/20/23 1600  Clinical Opiate Withdrawal Scale (COWS)  Resting Pulse Rate 0  Sweating 0  Restlessness 0  Pupil Size 0  Bone or Joint Aches 0  Runny Nose or Tearing 0  GI Upset 0  Tremor 0  Yawning 0  Anxiety or Irritability 0  Gooseflesh Skin 0  COWS Total Score 0

## 2023-01-20 NOTE — Plan of Care (Signed)
  Problem: Education: Goal: Knowledge of General Education information will improve Description: Including pain rating scale, medication(s)/side effects and non-pharmacologic comfort measures Outcome: Not Progressing   Problem: Health Behavior/Discharge Planning: Goal: Ability to manage health-related needs will improve Outcome: Not Progressing   Problem: Clinical Measurements: Goal: Ability to maintain clinical measurements within normal limits will improve Outcome: Not Progressing Goal: Will remain free from infection Outcome: Not Progressing Goal: Diagnostic test results will improve Outcome: Not Progressing Goal: Respiratory complications will improve Outcome: Not Progressing Goal: Cardiovascular complication will be avoided Outcome: Not Progressing   Problem: Activity: Goal: Risk for activity intolerance will decrease Outcome: Not Progressing   Problem: Nutrition: Goal: Adequate nutrition will be maintained Outcome: Not Progressing   Problem: Coping: Goal: Level of anxiety will decrease Outcome: Not Progressing   Problem: Elimination: Goal: Will not experience complications related to bowel motility Outcome: Not Progressing Goal: Will not experience complications related to urinary retention Outcome: Not Progressing   Problem: Pain Managment: Goal: General experience of comfort will improve Outcome: Not Progressing   Problem: Safety: Goal: Ability to remain free from injury will improve Outcome: Not Progressing   Problem: Skin Integrity: Goal: Risk for impaired skin integrity will decrease Outcome: Not Progressing   Problem: Education: Goal: Knowledge of [place] [facility] Education information/materials will improve Outcome: Not Progressing Goal: Emotional status will improve Outcome: Not Progressing Goal: Mental status will improve Outcome: Not Progressing Goal: Verbalization of understanding the information provided will improve Outcome: Not  Progressing   Problem: Activity: Goal: Interest or engagement in activities will improve Outcome: Not Progressing Goal: Sleeping patterns will improve Outcome: Not Progressing   Problem: Coping: Goal: Ability to verbalize frustrations and anger appropriately will improve Outcome: Not Progressing Goal: Ability to demonstrate self-control will improve Outcome: Not Progressing   Problem: Health Behavior/Discharge Planning: Goal: Identification of resources available to assist in meeting health care needs will improve Outcome: Not Progressing Goal: Compliance with treatment plan for underlying cause of condition will improve Outcome: Not Progressing   Problem: Physical Regulation: Goal: Ability to maintain clinical measurements within normal limits will improve Outcome: Not Progressing   Problem: Safety: Goal: Periods of time without injury will increase Outcome: Not Progressing   Problem: Education: Goal: Knowledge of [place] [facility] Education information/materials will improve Outcome: Not Progressing Goal: Emotional status will improve Outcome: Not Progressing Goal: Mental status will improve Outcome: Not Progressing Goal: Verbalization of understanding the information provided will improve Outcome: Not Progressing   Problem: Coping: Goal: Ability to verbalize frustrations and anger appropriately will improve Outcome: Not Progressing Goal: Ability to demonstrate self-control will improve Outcome: Not Progressing   Problem: Health Behavior/Discharge Planning: Goal: Identification of resources available to assist in meeting health care needs will improve Outcome: Not Progressing Goal: Compliance with treatment plan for underlying cause of condition will improve Outcome: Not Progressing   Problem: Physical Regulation: Goal: Ability to maintain clinical measurements within normal limits will improve Outcome: Not Progressing   Problem: Safety: Goal: Periods of  time without injury will increase Outcome: Not Progressing

## 2023-01-20 NOTE — Progress Notes (Signed)
Pt calm and pleasant during assessment denying SI/HI/AVH. Pt stated he wanted to sleep tonight. Pt compliant with medication administration per MD orders. Pt given education, support, and encouragement to be active in his treatment plan. Pt being monitored Q 15 minutes for safety per unit protocol, remains safe on the unit

## 2023-01-20 NOTE — Plan of Care (Signed)
Pt compliant with procedures on the unit, denies SI/HI/AVH  Problem: Education: Goal: Knowledge of General Education information will improve Description: Including pain rating scale, medication(s)/side effects and non-pharmacologic comfort measures Outcome: Progressing   Problem: Health Behavior/Discharge Planning: Goal: Ability to manage health-related needs will improve Outcome: Progressing   Problem: Clinical Measurements: Goal: Ability to maintain clinical measurements within normal limits will improve Outcome: Progressing Goal: Will remain free from infection Outcome: Progressing Goal: Diagnostic test results will improve Outcome: Progressing Goal: Respiratory complications will improve Outcome: Progressing Goal: Cardiovascular complication will be avoided Outcome: Progressing   Problem: Activity: Goal: Risk for activity intolerance will decrease Outcome: Progressing   Problem: Nutrition: Goal: Adequate nutrition will be maintained Outcome: Progressing   Problem: Coping: Goal: Level of anxiety will decrease Outcome: Progressing   Problem: Elimination: Goal: Will not experience complications related to bowel motility Outcome: Progressing Goal: Will not experience complications related to urinary retention Outcome: Progressing   Problem: Pain Managment: Goal: General experience of comfort will improve Outcome: Progressing   Problem: Safety: Goal: Ability to remain free from injury will improve Outcome: Progressing   Problem: Skin Integrity: Goal: Risk for impaired skin integrity will decrease Outcome: Progressing   Problem: Education: Goal: Knowledge of [place] [facility] Education information/materials will improve Outcome: Progressing Goal: Emotional status will improve Outcome: Progressing Goal: Mental status will improve Outcome: Progressing Goal: Verbalization of understanding the information provided will improve Outcome: Progressing    Problem: Activity: Goal: Interest or engagement in activities will improve Outcome: Progressing Goal: Sleeping patterns will improve Outcome: Progressing   Problem: Coping: Goal: Ability to verbalize frustrations and anger appropriately will improve Outcome: Progressing Goal: Ability to demonstrate self-control will improve Outcome: Progressing   Problem: Health Behavior/Discharge Planning: Goal: Identification of resources available to assist in meeting health care needs will improve Outcome: Progressing Goal: Compliance with treatment plan for underlying cause of condition will improve Outcome: Progressing   Problem: Physical Regulation: Goal: Ability to maintain clinical measurements within normal limits will improve Outcome: Progressing   Problem: Safety: Goal: Periods of time without injury will increase Outcome: Progressing   Problem: Education: Goal: Knowledge of [place] [facility] Education information/materials will improve Outcome: Progressing Goal: Emotional status will improve Outcome: Progressing Goal: Mental status will improve Outcome: Progressing Goal: Verbalization of understanding the information provided will improve Outcome: Progressing   Problem: Coping: Goal: Ability to verbalize frustrations and anger appropriately will improve Outcome: Progressing Goal: Ability to demonstrate self-control will improve Outcome: Progressing   Problem: Health Behavior/Discharge Planning: Goal: Identification of resources available to assist in meeting health care needs will improve Outcome: Progressing Goal: Compliance with treatment plan for underlying cause of condition will improve Outcome: Progressing   Problem: Physical Regulation: Goal: Ability to maintain clinical measurements within normal limits will improve Outcome: Progressing   Problem: Safety: Goal: Periods of time without injury will increase Outcome: Progressing

## 2023-01-21 DIAGNOSIS — F1994 Other psychoactive substance use, unspecified with psychoactive substance-induced mood disorder: Secondary | ICD-10-CM | POA: Diagnosis not present

## 2023-01-21 NOTE — [facility] Counselor (Signed)
CSW gave pt BATS program application to fill out and return back to Child psychotherapist.    Reynaldo Minium, MSW, [place] 01/21/2023 4:06 PM

## 2023-01-21 NOTE — Group Note (Signed)
Recreation Therapy Group Note   Group Topic:Healthy Support Systems  Group Date: 01/21/2023 Start Time: 1300 End Time: 1355 Facilitators: Rosina Lowenstein, LRT, CTRS Location:  Craft Room  Group Description: Straw Bridge. Individually, patients were given 10 plastic drinking straws and an equal length of masking tape. Using the materials provided, patients were instructed to build a free-standing bridge-like structure to suspend an everyday item (ex: puzzle box) off the floor or table surface. All materials were required to be used in Secondary school teacher. LRT facilitated post-activity discussion reviewing how we, humans, are like the structure we built; when things get too heavy in our life and we do not have adequate supports/coping skills, then we will fall just like the straw-built structure will. LRT focused on how having a "base" or structure on the bottom was necessary for the object to stand, meaning we must be secure and stable first before building on ourselves or others. Patients were encouraged to name 2 healthy supports in their life and reflect on how the skills used in this activity can be generalized to daily life post discharge.   Goal Area(s) Addressed:  Patient will identify two healthy support systems in their life. Patient will work on Product manager. Patient will verbalize the importance of having a strong and steady "base".  Patient will follow multi-step directions. Patients will engage in creativity and use all provided materials.   Affect/Mood: N/A   Participation Level: Did not attend    Clinical Observations/Individualized Feedback: Aking did not attend group.  Plan: Continue to engage patient in RT group sessions 2-3x/week.   Rosina Lowenstein, LRT, CTRS 01/21/2023 2:15 PM

## 2023-01-21 NOTE — Progress Notes (Addendum)
Patient admitted voluntarily from [facility] on Sept 232, 2024 for SI with plan to walk into traffic. Reports of depression and PTSD, and substance abuse. Diagnosis of psychoactive substance-induced mood disorder.  Patient endorses passive SI with no plan and agrees to verbally contract for safety. Patient is on COWS.  Q15 minute unit checks in place.

## 2023-01-21 NOTE — Progress Notes (Signed)
Pt continues to endorse passive SI denies plan or intent and contracts for safety.  Pt also endorses hopelessness and anxiety.  Pt is compliant with medications.  Pt received albuterol inhaler late evening for asthma.  Continued monitoring for safety.    01/21/23 2200  Psych Admission Type (Psych Patients Only)  Admission Status Voluntary  Psychosocial Assessment  Patient Complaints Anxiety;Depression  Eye Contact Fair  Facial Expression Sad  Affect Sad  Speech Logical/coherent  Interaction Assertive  Motor Activity Slow  Appearance/Hygiene Poor hygiene  Behavior Characteristics Cooperative  Mood Pleasant  Thought Process  Coherency Circumstantial  Content Blaming others  Delusions None reported or observed  Perception WDL  Hallucination None reported or observed  Judgment WDL  Confusion None  Danger to Self  Current suicidal ideation? Passive  Agreement Not to Harm Self Yes  Description of Agreement verbal  Danger to Others  Danger to Others None reported or observed

## 2023-01-21 NOTE — [facility] Counselor (Signed)
CSW gave pt list of [place] in [place], [place], [place] and [place] per his request.    Nathan Koch, MSW, [facility] 01/21/2023 3:06 PM

## 2023-01-21 NOTE — Group Note (Signed)
[facility] LCSW Group Therapy Note   Group Date: 01/21/2023 Start Time: 1030 End Time: 1130   Type of Therapy/Topic:  Group Therapy:  Emotion Regulation  Participation Level:  Minimal   Mood:  Description of Group:    The purpose of this group is to assist patients in learning to regulate negative emotions and experience positive emotions. Patients will be guided to discuss ways in which they have been vulnerable to their negative emotions. These vulnerabilities will be juxtaposed with experiences of positive emotions or situations, and patients challenged to use positive emotions to combat negative ones. Special emphasis will be placed on coping with negative emotions in conflict situations, and patients will process healthy conflict resolution skills.  Therapeutic Goals: Patient will identify two positive emotions or experiences to reflect on in order to balance out negative emotions:  Patient will label two or more emotions that they find the most difficult to experience:  Patient will be able to demonstrate positive conflict resolution skills through discussion or role plays:   Summary of Patient Progress: Patient was present in group. Patient was attentive and supportive of other group members.  Patient did engage in discussion and displayed fair insight.   Therapeutic Modalities:   Cognitive Behavioral Therapy Feelings Identification Dialectical Behavioral Therapy   Harden Mo, LCSW

## 2023-01-21 NOTE — Group Note (Signed)
Date:  01/21/2023 Time:  9:02 PM  Group Topic/Focus:  Goals Group:   The focus of this group is to help patients establish daily goals to achieve during treatment and discuss how the patient can incorporate goal setting into their daily lives to aide in recovery.    Participation Level:  Minimal  Participation Quality:  Appropriate  Affect:  Appropriate  Cognitive:  Appropriate  Insight: Good  Engagement in Group:  Improving  Modes of Intervention:  Socialization  Additional Comments:    Dallie Piles Ludivina Guymon 01/21/2023, 9:02 PM

## 2023-01-21 NOTE — Group Note (Signed)
Date:  01/21/2023 Time:  5:20 PM  Group Topic/Focus:  Self Care:   The focus of this group is to help patients understand the importance of self-care in order to improve or restore emotional, physical, spiritual, interpersonal, and financial health.    Participation Level:  Active  Participation Quality:  Appropriate  Affect:  Appropriate  Cognitive:  Appropriate  Insight: Appropriate  Engagement in Group:  Engaged  Modes of Intervention:  Activity  Additional Comments:    Jadira Nierman 01/21/2023, 5:20 PM

## 2023-01-21 NOTE — Progress Notes (Signed)
Nathan Koch  01/21/2023 12:31 PM Nathan Koch  MRN:  [id number] Subjective: [name] is seen on rounds.  He has been taking his medication as prescribed and denies any side effects.  He is still interested in going to drug and alcohol rehab but has not heard anything for many agencies.  Nurses report no issues.  He has been in a good mood.  He denies any suicidal ideation. Principal Problem: Other psychoactive substance-induced mood disorder ([name]) Diagnosis: Principal Problem:   Other psychoactive substance-induced mood disorder ([name])  Total Time spent with patient: 15 minutes  Past Psychiatric History: Depression and polysubstance abuse  Past Medical History:  Past Medical History:  Diagnosis Date   Anxiety    Anxiety disorder    Asthma    Bipolar 1 disorder ([name])    Depression    Methamphetamine abuse ([name])    PTSD (post-traumatic stress disorder)     Past Surgical History:  Procedure Laterality Date   CLOSED REDUCTION MANDIBLE N/A 07/02/2018   Procedure: CLOSED REDUCTION MANDIBULAR;  Surgeon: Vernie Murders, MD;  Location: [facility] ORS;  Service: ENT;  Laterality: N/A;   Family History:  Family History  Problem Relation Age of Onset   Asthma Mother    Cancer Mother        breast cancer   Ulcers Mother    Cancer Father        esophogeal cancer   Hypertension Father    Asthma Brother    Family Psychiatric  History: Unremarkable Social History:  Social History   Substance and Sexual Activity  Alcohol Use Not Currently   Alcohol/week: 13.0 standard drinks of alcohol   Types: 6 Cans of beer, 7 Standard drinks or equivalent per week   Comment: previously heavy drinker 2016. most recent 4 beer/ day drinker and none since 01-11-2020     Social History   Substance and Sexual Activity  Drug Use Yes   Types: Methamphetamines   Comment: states he uses 1 g every other day    Social History   Socioeconomic History   Marital status: Single    Spouse name: Not on  file   Number of children: 1   Years of education: 10   Highest education level: 10th grade  Occupational History   Not on file  Tobacco Use   Smoking status: Every Day    Current packs/day: 0.50    Average packs/day: 0.5 packs/day for 8.0 years (4.0 ttl pk-yrs)    Types: Cigarettes   Smokeless tobacco: Never  Vaping Use   Vaping status: Every Day   Substances: Nicotine, Flavoring  Substance and Sexual Activity   Alcohol use: Not Currently    Alcohol/week: 13.0 standard drinks of alcohol    Types: 6 Cans of beer, 7 Standard drinks or equivalent per week    Comment: previously heavy drinker 2016. most recent 4 beer/ day drinker and none since 01-11-2020   Drug use: Yes    Types: Methamphetamines    Comment: states he uses 1 g every other day   Sexual activity: Yes  Other Topics Concern   Not on file  Social History Narrative   Not on file   Social Determinants of Health   Financial Resource Strain: High Risk (05/05/2022)   [facility] from [facility], [facility]   Overall Financial Resource Strain (CARDIA)    Difficulty of Paying Living Expenses: Very hard  Food Insecurity: Food Insecurity Present (01/18/2023)   Hunger  Vital Sign    Worried About Programme researcher, broadcasting/film/video in the Last Year: Sometimes true    Ran Out of Food in the Last Year: Sometimes true  Transportation Needs: No Transportation Needs (01/18/2023)   PRAPARE - Administrator, Civil Service (Medical): No    Lack of Transportation (Non-Medical): No  Physical Activity: Not on file  Stress: Not on file  Social Connections: Unknown (09/09/2021)   Received from [facility], [facility]   Social Network    Social Network: Not on file   Additional Social History:                         Sleep: Good  Appetite:  Good  Current Medications: Current Facility-Administered Medications  Medication Dose Route Frequency Provider Last Rate Last Admin    acetaminophen (TYLENOL) tablet 650 mg  650 mg Oral Q6H PRN Rankin, Shuvon B, NP       albuterol (VENTOLIN HFA) 108 (90 Base) MCG/ACT inhaler 2 puff  2 puff Inhalation Q4H PRN Sarina Ill, DO   2 puff at 01/19/23 2101   alum & mag hydroxide-simeth (MAALOX/MYLANTA) 200-200-20 MG/5ML suspension 30 mL  30 mL Oral Q4H PRN Rankin, Shuvon B, NP       dicyclomine (BENTYL) tablet 20 mg  20 mg Oral Q6H PRN Rankin, Shuvon B, NP   20 mg at 01/20/23 0851   diphenhydrAMINE (BENADRYL) capsule 50 mg  50 mg Oral TID PRN Rankin, Shuvon B, NP       Or   diphenhydrAMINE (BENADRYL) injection 50 mg  50 mg Intramuscular TID PRN Rankin, Shuvon B, NP       FLUoxetine (PROZAC) capsule 20 mg  20 mg Oral Daily Sarina Ill, DO   20 mg at 01/21/23 5956   hydrocortisone cream 1 %   Topical TID PRN Rankin, Shuvon B, NP       hydrOXYzine (ATARAX) tablet 25 mg  25 mg Oral Q6H PRN Rankin, Shuvon B, NP       loperamide (IMODIUM) capsule 2-4 mg  2-4 mg Oral PRN Rankin, Shuvon B, NP   2 mg at 01/20/23 0851   magnesium hydroxide (MILK OF MAGNESIA) suspension 30 mL  30 mL Oral Daily PRN Rankin, Shuvon B, NP       methocarbamol (ROBAXIN) tablet 500 mg  500 mg Oral Q8H PRN Rankin, Shuvon B, NP   500 mg at 01/20/23 0851   naproxen (NAPROSYN) tablet 500 mg  500 mg Oral BID PRN Rankin, Shuvon B, NP   500 mg at 01/20/23 0851   ondansetron (ZOFRAN-ODT) disintegrating tablet 4 mg  4 mg Oral Q6H PRN Rankin, Shuvon B, NP       QUEtiapine (SEROQUEL) tablet 150 mg  150 mg Oral QHS Rankin, Shuvon B, NP   150 mg at 01/20/23 2113   risperiDONE (RISPERDAL) tablet 0.5 mg  0.5 mg Oral BH-q8a4p Sarina Ill, DO   0.5 mg at 01/21/23 3875   traZODone (DESYREL) tablet 50 mg  50 mg Oral QHS PRN Rankin, Shuvon B, NP   50 mg at 01/20/23 2113    Lab Results: No results found for this or any previous visit (from the past 48 hour(s)).  Blood Alcohol level:  Lab Results  Component Value Date   [facility] <10 01/17/2023   ETH <10  12/29/2021    Metabolic Disorder Labs: Lab Results  Component Value Date   HGBA1C 7.2 (H) 01/17/2023  MPG 159.94 01/17/2023   MPG 126 01/17/2021   Lab Results  Component Value Date   PROLACTIN 10.1 01/17/2023   Lab Results  Component Value Date   CHOL 136 01/17/2023   TRIG 101 01/17/2023   HDL 37 (L) 01/17/2023   CHOLHDL 3.7 01/17/2023   VLDL 20 01/17/2023   LDLCALC 79 01/17/2023   LDLCALC 46 01/17/2021    Physical Findings: AIMS:  , ,  ,  ,    CIWA:    COWS:  COWS Total Score: 0  Musculoskeletal: Strength & Muscle Tone: within normal limits Gait & Station: normal Patient leans: N/A  Psychiatric Specialty Exam:  Presentation  General Appearance:  Appropriate for Environment  Eye Contact: Good  Speech: Clear and Coherent; Normal Rate  Speech Volume: Normal  Handedness: Right   Mood and Affect  Mood: Anxious; Depressed  Affect: Congruent; Depressed   Thought Process  Thought Processes: Coherent; Goal Directed  Descriptions of Associations:Intact  Orientation:Full (Time, Place and Person)  Thought Content:Logical  History of Schizophrenia/Schizoaffective disorder:No  Duration of Psychotic Symptoms:N/A  Hallucinations:No data recorded Ideas of Reference:None  Suicidal Thoughts:No data recorded Homicidal Thoughts:No data recorded  Sensorium  Memory: Immediate Good; Recent Good  Judgment: Fair  Insight: Fair; Present   Executive Functions  Concentration: Good  Attention Span: Good  Recall: Good  Fund of Knowledge: Good  Language: Good   Psychomotor Activity  Psychomotor Activity:No data recorded  Assets  Assets: Communication Skills; Desire for Improvement; Physical Health; Resilience   Sleep  Sleep:No data recorded    Blood pressure 104/65, pulse 80, temperature 97.9 F (36.6 C), resp. rate 17, height 5\' 9"  (1.753 m), weight 105.2 kg, SpO2 98%. Body mass index is 34.26 kg/m.   Treatment Plan  Summary: Daily contact with patient to assess and evaluate symptoms and progress in treatment, Medication management, and Plan continue current medication.  Sarina Ill, DO 01/21/2023, 12:31 PM

## 2023-01-21 NOTE — Plan of Care (Signed)
Problem: Activity: Goal: Risk for activity intolerance will decrease Outcome: Progressing   Problem: Nutrition: Goal: Adequate nutrition will be maintained Outcome: Progressing   Problem: Elimination: Goal: Will not experience complications related to bowel motility Outcome: Progressing Goal: Will not experience complications related to urinary retention Outcome: Progressing

## 2023-01-22 DIAGNOSIS — F1994 Other psychoactive substance use, unspecified with psychoactive substance-induced mood disorder: Secondary | ICD-10-CM | POA: Diagnosis not present

## 2023-01-22 NOTE — Progress Notes (Addendum)
Patient presents flat and depressed. Patient ate breakfast this morning and is med compliant. Patient denies SI,HI, and A/V/H with no plan or intent but rates his depression a 7 out of 10. Patient denies any pain or discomfort. Patient states his goal for today is "being happy." No s/s of current distress.     01/22/23 0833  Psych Admission Type (Psych Patients Only)  Admission Status Voluntary  Psychosocial Assessment  Patient Complaints Depression  Eye Contact Fair  Facial Expression Flat  Affect Flat;Depressed  Speech Logical/coherent  Interaction Assertive  Motor Activity Slow  Appearance/Hygiene Poor hygiene  Behavior Characteristics Cooperative  Mood Depressed;Pleasant  Thought Process  Coherency Circumstantial  Content WDL  Delusions None reported or observed  Perception WDL  Hallucination None reported or observed  Judgment WDL  Confusion None  Danger to Self  Current suicidal ideation? Denies  Agreement Not to Harm Self Yes  Description of Agreement verbal  Danger to Others  Danger to Others None reported or observed

## 2023-01-22 NOTE — Progress Notes (Signed)
[facility] MD Progress Note  01/22/2023 11:45 AM Nathan Koch  MRN:  [id number] Subjective: Nathan Koch is seen on rounds.  He has been meeting with social work about going to [place] for drug and alcohol rehab.  He says that he is sleeping well and tolerating the medications.  He denies any side effects from his medications.  Nurses report no issues.  He feels like the medications have been helpful. Principal Problem: Other psychoactive substance-induced mood disorder ([name]) Diagnosis: Principal Problem:   Other psychoactive substance-induced mood disorder ([name])  Total Time spent with patient: 15 minutes  Past Psychiatric History: Depression and polysubstance abuse  Past Medical History:  Past Medical History:  Diagnosis Date   Anxiety    Anxiety disorder    Asthma    Bipolar 1 disorder ([name])    Depression    Methamphetamine abuse ([name])    PTSD (post-traumatic stress disorder)     Past Surgical History:  Procedure Laterality Date   CLOSED REDUCTION MANDIBLE N/A 07/02/2018   Procedure: CLOSED REDUCTION MANDIBULAR;  Surgeon: Vernie Murders, MD;  Location: [facility] ORS;  Service: ENT;  Laterality: N/A;   Family History:  Family History  Problem Relation Age of Onset   Asthma Mother    Cancer Mother        breast cancer   Ulcers Mother    Cancer Father        esophogeal cancer   Hypertension Father    Asthma Brother    Family Psychiatric  History: Unremarkable Social History:  Social History   Substance and Sexual Activity  Alcohol Use Not Currently   Alcohol/week: 13.0 standard drinks of alcohol   Types: 6 Cans of beer, 7 Standard drinks or equivalent per week   Comment: previously heavy drinker 2016. most recent 4 beer/ day drinker and none since 01-11-2020     Social History   Substance and Sexual Activity  Drug Use Yes   Types: Methamphetamines   Comment: states he uses 1 g every other day    Social History   Socioeconomic History   Marital status: Single    Spouse name: Not  on file   Number of children: 1   Years of education: 10   Highest education level: 10th grade  Occupational History   Not on file  Tobacco Use   Smoking status: Every Day    Current packs/day: 0.50    Average packs/day: 0.5 packs/day for 8.0 years (4.0 ttl pk-yrs)    Types: Cigarettes   Smokeless tobacco: Never  Vaping Use   Vaping status: Every Day   Substances: Nicotine, Flavoring  Substance and Sexual Activity   Alcohol use: Not Currently    Alcohol/week: 13.0 standard drinks of alcohol    Types: 6 Cans of beer, 7 Standard drinks or equivalent per week    Comment: previously heavy drinker 2016. most recent 4 beer/ day drinker and none since 01-11-2020   Drug use: Yes    Types: Methamphetamines    Comment: states he uses 1 g every other day   Sexual activity: Yes  Other Topics Concern   Not on file  Social History Narrative   Not on file   Social Determinants of Health   Financial Resource Strain: High Risk (05/05/2022)   [facility], [facility]   Overall Financial Resource Strain (CARDIA)    Difficulty of Paying Living Expenses: Very hard  Food Insecurity: Food Insecurity Present (01/18/2023)   Hunger Vital  Sign    Worried About Programme researcher, broadcasting/film/video in the Last Year: Sometimes true    Ran Out of Food in the Last Year: Sometimes true  Transportation Needs: No Transportation Needs (01/18/2023)   PRAPARE - Administrator, Civil Service (Medical): No    Lack of Transportation (Non-Medical): No  Physical Activity: Not on file  Stress: Not on file  Social Connections: Unknown (09/09/2021)   Received from [facility], [facility]   Social Network    Social Network: Not on file   Additional Social History:                         Sleep: Good  Appetite:  Good  Current Medications: Current Facility-Administered Medications  Medication Dose Route Frequency Provider Last Rate Last Admin    acetaminophen (TYLENOL) tablet 650 mg  650 mg Oral Q6H PRN Rankin, Shuvon B, NP       albuterol (VENTOLIN HFA) 108 (90 Base) MCG/ACT inhaler 2 puff  2 puff Inhalation Q4H PRN Sarina Ill, DO   2 puff at 01/21/23 2227   alum & mag hydroxide-simeth (MAALOX/MYLANTA) 200-200-20 MG/5ML suspension 30 mL  30 mL Oral Q4H PRN Rankin, Shuvon B, NP       diphenhydrAMINE (BENADRYL) capsule 50 mg  50 mg Oral TID PRN Rankin, Shuvon B, NP       Or   diphenhydrAMINE (BENADRYL) injection 50 mg  50 mg Intramuscular TID PRN Rankin, Shuvon B, NP       FLUoxetine (PROZAC) capsule 20 mg  20 mg Oral Daily Sarina Ill, DO   20 mg at 01/22/23 5638   hydrocortisone cream 1 %   Topical TID PRN Rankin, Shuvon B, NP       magnesium hydroxide (MILK OF MAGNESIA) suspension 30 mL  30 mL Oral Daily PRN Rankin, Shuvon B, NP       QUEtiapine (SEROQUEL) tablet 150 mg  150 mg Oral QHS Rankin, Shuvon B, NP   150 mg at 01/21/23 2126   risperiDONE (RISPERDAL) tablet 0.5 mg  0.5 mg Oral BH-q8a4p Sarina Ill, DO   0.5 mg at 01/22/23 7564   traZODone (DESYREL) tablet 50 mg  50 mg Oral QHS PRN Rankin, Shuvon B, NP   50 mg at 01/20/23 2113    Lab Results: No results found for this or any previous visit (from the past 48 hour(s)).  Blood Alcohol level:  Lab Results  Component Value Date   ETH <10 01/17/2023   ETH <10 12/29/2021    Metabolic Disorder Labs: Lab Results  Component Value Date   HGBA1C 7.2 (H) 01/17/2023   MPG 159.94 01/17/2023   MPG 126 01/17/2021   Lab Results  Component Value Date   PROLACTIN 10.1 01/17/2023   Lab Results  Component Value Date   CHOL 136 01/17/2023   TRIG 101 01/17/2023   HDL 37 (L) 01/17/2023   CHOLHDL 3.7 01/17/2023   VLDL 20 01/17/2023   LDLCALC 79 01/17/2023   LDLCALC 46 01/17/2021    Physical Findings: AIMS:  , ,  ,  ,    CIWA:    COWS:  COWS Total Score: 1  Musculoskeletal: Strength & Muscle Tone: within normal limits Gait & Station:  normal Patient leans: N/A  Psychiatric Specialty Exam:  Presentation  General Appearance:  Appropriate for Environment  Eye Contact: Good  Speech: Clear and Coherent; Normal Rate  Speech Volume: Normal  Handedness:  Right   Mood and Affect  Mood: Anxious; Depressed  Affect: Congruent; Depressed   Thought Process  Thought Processes: Coherent; Goal Directed  Descriptions of Associations:Intact  Orientation:Full (Time, Place and Person)  Thought Content:Logical  History of Schizophrenia/Schizoaffective disorder:No  Duration of Psychotic Symptoms:N/A  Hallucinations:No data recorded Ideas of Reference:None  Suicidal Thoughts:No data recorded Homicidal Thoughts:No data recorded  Sensorium  Memory: Immediate Good; Recent Good  Judgment: Fair  Insight: Fair; Present   Executive Functions  Concentration: Good  Attention Span: Good  Recall: Good  Fund of Knowledge: Good  Language: Good   Psychomotor Activity  Psychomotor Activity:No data recorded  Assets  Assets: Communication Skills; Desire for Improvement; Physical Health; Resilience   Sleep  Sleep:No data recorded    Blood pressure 104/61, pulse 76, temperature (!) 97.5 F (36.4 C), resp. rate 16, height 5\' 9"  (1.753 m), weight 105.2 kg, SpO2 99%. Body mass index is 34.26 kg/m.   Treatment Plan Summary: Daily contact with patient to assess and evaluate symptoms and progress in treatment, Medication management, and Plan continue current medications.  Sarina Ill, DO 01/22/2023, 11:45 AM

## 2023-01-22 NOTE — Plan of Care (Signed)
  Problem: Education: Goal: Knowledge of General Education information will improve Description: Including pain rating scale, medication(s)/side effects and non-pharmacologic comfort measures Outcome: Progressing   Problem: Health Behavior/Discharge Planning: Goal: Ability to manage health-related needs will improve Outcome: Progressing   Problem: Clinical Measurements: Goal: Ability to maintain clinical measurements within normal limits will improve Outcome: Progressing Goal: Will remain free from infection Outcome: Progressing Goal: Diagnostic test results will improve Outcome: Progressing Goal: Respiratory complications will improve Outcome: Progressing Goal: Cardiovascular complication will be avoided Outcome: Progressing   Problem: Activity: Goal: Risk for activity intolerance will decrease Outcome: Progressing   Problem: Nutrition: Goal: Adequate nutrition will be maintained Outcome: Progressing   Problem: Coping: Goal: Level of anxiety will decrease Outcome: Progressing   Problem: Elimination: Goal: Will not experience complications related to bowel motility Outcome: Progressing Goal: Will not experience complications related to urinary retention Outcome: Progressing   Problem: Pain Managment: Goal: General experience of comfort will improve Outcome: Progressing   Problem: Safety: Goal: Ability to remain free from injury will improve Outcome: Progressing   Problem: Skin Integrity: Goal: Risk for impaired skin integrity will decrease Outcome: Progressing   Problem: Education: Goal: Knowledge of [place] [facility] Education information/materials will improve Outcome: Progressing Goal: Emotional status will improve Outcome: Progressing Goal: Mental status will improve Outcome: Progressing Goal: Verbalization of understanding the information provided will improve Outcome: Progressing   Problem: Activity: Goal: Interest or engagement in activities will  improve Outcome: Progressing Goal: Sleeping patterns will improve Outcome: Progressing   Problem: Coping: Goal: Ability to verbalize frustrations and anger appropriately will improve Outcome: Progressing Goal: Ability to demonstrate self-control will improve Outcome: Progressing   Problem: Health Behavior/Discharge Planning: Goal: Identification of resources available to assist in meeting health care needs will improve Outcome: Progressing Goal: Compliance with treatment plan for underlying cause of condition will improve Outcome: Progressing   Problem: Physical Regulation: Goal: Ability to maintain clinical measurements within normal limits will improve Outcome: Progressing   Problem: Safety: Goal: Periods of time without injury will increase Outcome: Progressing   Problem: Education: Goal: Knowledge of [place] [facility] Education information/materials will improve Outcome: Progressing Goal: Emotional status will improve Outcome: Progressing Goal: Mental status will improve Outcome: Progressing Goal: Verbalization of understanding the information provided will improve Outcome: Progressing   Problem: Coping: Goal: Ability to verbalize frustrations and anger appropriately will improve Outcome: Progressing Goal: Ability to demonstrate self-control will improve Outcome: Progressing   Problem: Health Behavior/Discharge Planning: Goal: Identification of resources available to assist in meeting health care needs will improve Outcome: Progressing Goal: Compliance with treatment plan for underlying cause of condition will improve Outcome: Progressing   Problem: Physical Regulation: Goal: Ability to maintain clinical measurements within normal limits will improve Outcome: Progressing   Problem: Safety: Goal: Periods of time without injury will increase Outcome: Progressing

## 2023-01-22 NOTE — BHH Counselor (Signed)
CSW provided patient with list of [facility] vacancies.   Penni Homans, MSW, LCSW 01/22/2023 4:28 PM

## 2023-01-22 NOTE — Progress Notes (Incomplete)
Nursing Shift Note:  1900-0700  Attended Evening Group:  Medication Compliant:   Behavior:  Sleep Quality:  Significant Changes:    01/22/23 2100  Psych Admission Type (Psych Patients Only)  Admission Status Voluntary  Psychosocial Assessment  Patient Complaints Depression  Eye Contact Fair  Facial Expression Flat  Affect Flat;Depressed  Speech Logical/coherent  Interaction Assertive  Motor Activity Slow  Appearance/Hygiene Poor hygiene  Behavior Characteristics Cooperative  Mood Depressed;Pleasant  Thought Process  Coherency WDL  Content WDL  Delusions None reported or observed  Perception WDL  Hallucination None reported or observed  Judgment WDL  Confusion None  Danger to Self  Current suicidal ideation? Denies  Agreement Not to Harm Self Yes  Description of Agreement verbal  Danger to Others  Danger to Others None reported or observed

## 2023-01-22 NOTE — BHH Counselor (Signed)
CSW sent referral for pt to [facility] at [email or web address] Haste, MSW, [facility] 01/22/2023 4:11 PM

## 2023-01-22 NOTE — Group Note (Signed)
Date:  01/22/2023 Time:  10:44 AM  Group Topic/Focus:  Goals Group:   The focus of this group is to help patients establish daily goals to achieve during treatment and discuss how the patient can incorporate goal setting into their daily lives to aide in recovery.    Participation Level:  Did Not Attend   Lynelle Smoke [facility] 01/22/2023, 10:44 AM

## 2023-01-22 NOTE — Plan of Care (Signed)
  Problem: Safety: Goal: Ability to remain free from injury will improve Outcome: Progressing   Problem: Health Behavior/Discharge Planning: Goal: Compliance with treatment plan for underlying cause of condition will improve Outcome: Progressing   

## 2023-01-22 NOTE — Progress Notes (Signed)
Nursing Shift Note:  1900-0700  Attended Evening Group: yes Medication Compliant:  yes Behavior: pleasant, cooperative Sleep Quality: good Significant Changes: none reported.   01/22/23 2100  Psych Admission Type (Psych Patients Only)  Admission Status Voluntary  Psychosocial Assessment  Patient Complaints Depression  Eye Contact Fair  Facial Expression Flat  Affect Flat;Depressed  Speech Logical/coherent  Interaction Assertive  Motor Activity Slow  Appearance/Hygiene Poor hygiene  Behavior Characteristics Cooperative  Mood Depressed;Pleasant  Thought Process  Coherency WDL  Content WDL  Delusions None reported or observed  Perception WDL  Hallucination None reported or observed  Judgment WDL  Confusion None  Danger to Self  Current suicidal ideation? Denies  Agreement Not to Harm Self Yes  Description of Agreement verbal  Danger to Others  Danger to Others None reported or observed

## 2023-01-22 NOTE — Group Note (Signed)
Recreation Therapy Group Note   Group Topic:Leisure Education  Group Date: 01/22/2023 Start Time: 1000 End Time: 1100 Facilitators: Rosina Lowenstein, LRT, CTRS Location:  Craft Room  Group Description: Leisure. Patients were given the option to choose from singing karaoke, coloring mandalas, using oil pastels, journaling, or playing with play-doh. LRT and pts discussed the meaning of leisure, the importance of participating in leisure during their free time/when they're outside of the hospital, as well as how our leisure interests can also serve as coping skills.    Goal Area(s) Addressed:  Patient will identify a current leisure interest.  Patient will learn the definition of "leisure". Patient will practice making a positive decision. Patient will have the opportunity to try a new leisure activity. Patient will communicate with peers and LRT.    Affect/Mood: N/A   Participation Level: Did not attend    Clinical Observations/Individualized Feedback: Sarath did not attend group.  Plan: Continue to engage patient in RT group sessions 2-3x/week.   Rosina Lowenstein, LRT, CTRS 01/22/2023 11:49 AM

## 2023-01-23 DIAGNOSIS — F1994 Other psychoactive substance use, unspecified with psychoactive substance-induced mood disorder: Secondary | ICD-10-CM | POA: Diagnosis not present

## 2023-01-23 NOTE — Group Note (Signed)
Date:  01/23/2023 Time:  8:33 PM  Group Topic/Focus:  Goals Group:   The focus of this group is to help patients establish daily goals to achieve during treatment and discuss how the patient can incorporate goal setting into their daily lives to aide in recovery.    Participation Level:  Active  Participation Quality:  Appropriate  Affect:  Appropriate  Cognitive:  Appropriate  Insight: Appropriate  Engagement in Group:  Engaged  Modes of Intervention:  Education  Additional Comments:    Nathan Koch 01/23/2023, 8:33 PM

## 2023-01-23 NOTE — Group Note (Signed)
Date:  01/23/2023 Time:  2:03 AM  Group Topic/Focus:  Wrap-Up Group:   The focus of this group is to help patients review their daily goal of treatment and discuss progress on daily workbooks.    Participation Level:  Active  Participation Quality:  Appropriate  Affect:  Appropriate  Cognitive:  Appropriate  Insight: Appropriate  Engagement in Group:  Engaged  Modes of Intervention:  Discussion   Nathan Koch 01/23/2023, 2:03 AM

## 2023-01-23 NOTE — Progress Notes (Signed)
D- Patient alert and oriented. Patient presented in a sullen, but pleasant mood on assessment stating that he slept "pretty good" last night and had no complaints to voice to this Clinical research associate. Patient endorsed passive SI and a little bit of depression. Patient contracts with this Clinical research associate and stated that he feels safe in the hospital. Patient  denied HI/AVH and pain at this time. Patient had no stated goals for today, but has been asking about when he'll discharge and for the number to [facility] in [place], [place].   A- Scheduled medications administered to patient, per MD orders. Support and encouragement provided. Routine safety checks conducted every 15 minutes. Patient informed to notify staff with problems or concerns.  R- No adverse drug reactions noted. Patient contracts for safety at this time. Patient compliant with medications and treatment plan. Patient receptive, calm, and cooperative. Patient interacts well with others on the unit. Patient remains safe at this time.   01/23/23 0835  Psych Admission Type (Psych Patients Only)  Admission Status Voluntary  Psychosocial Assessment  Patient Complaints Depression;Self-harm thoughts  Eye Contact Fair;Watchful  Facial Expression Sullen  Affect Sullen  Speech Logical/coherent  Interaction Assertive  Motor Activity Slow  Appearance/Hygiene Disheveled;Poor hygiene  Behavior Characteristics Cooperative;Appropriate to situation  Mood Sullen;Pleasant  Aggressive Behavior  Effect No apparent injury  Thought Process  Coherency Circumstantial  Content Blaming self  Delusions None reported or observed  Perception WDL  Hallucination None reported or observed  Judgment WDL  Confusion None  Danger to Self  Current suicidal ideation? Passive  Self-Injurious Behavior Some self-injurious ideation observed or expressed.  No lethal plan expressed   Agreement Not to Harm Self Yes  Description of Agreement Verbal  Danger to Others  Danger to Others  None reported or observed

## 2023-01-23 NOTE — Progress Notes (Signed)
   01/23/23 0800  Clinical Opiate Withdrawal Scale (COWS)  Resting Pulse Rate 1  Sweating 0  Restlessness 0  Pupil Size 0  Bone or Joint Aches 0  Runny Nose or Tearing 0  GI Upset 0  Tremor 0  Yawning 0  Anxiety or Irritability 0  Gooseflesh Skin 0  COWS Total Score 1

## 2023-01-23 NOTE — Group Note (Signed)
Date:  01/23/2023 Time:  10:52 AM  Group Topic/Focus:  Coping With Mental Health Crisis:   The purpose of this group is to help patients identify strategies for coping with mental health crisis.  Group discusses possible causes of crisis and ways to manage them effectively.    Participation Level:  Did Not Attend   Nathan Koch 01/23/2023, 10:52 AM

## 2023-01-23 NOTE — Group Note (Signed)
Date:  01/23/2023 Time:  6:19 PM  Group Topic/Focus:  Activity Group:  The focus of the group is to promote activity for the patients to get some exercise and some fresh air outside in the courtyard.   Participation Level:  Active  Participation Quality:  Appropriate  Affect:  Appropriate  Cognitive:  Alert  Insight: Appropriate  Engagement in Group:  Engaged  Modes of Intervention:  Activity  Additional Comments:    Nathan Koch Sites 01/23/2023, 6:19 PM

## 2023-01-23 NOTE — Progress Notes (Signed)
   01/23/23 1600  Clinical Opiate Withdrawal Scale (COWS)  Resting Pulse Rate 0  Sweating 0  Restlessness 0  Pupil Size 0  Bone or Joint Aches 0  Runny Nose or Tearing 0  GI Upset 0  Tremor 0  Yawning 0  Anxiety or Irritability 0  Gooseflesh Skin 0  COWS Total Score 0

## 2023-01-23 NOTE — Progress Notes (Signed)
[facility] MD Progress Note  01/23/2023 11:19 AM Nathan Koch  MRN:  [id number] Subjective: Nathan Koch is seen on rounds.  He tells me that he slept well last night.  He also says that he has an interview with [facility] tomorrow and he is still working on [place] for drug and alcohol rehab.  He has been pleasant and cooperative and no problem on the unit.  He has been in good controls.  Has been compliant with his medications.  He denies any side effects. Principal Problem: Other psychoactive substance-induced mood disorder ([name]) Diagnosis: Principal Problem:   Other psychoactive substance-induced mood disorder ([name])  Total Time spent with patient: 15 minutes  Past Psychiatric History: Depression and polysubstance abuse  Past Medical History:  Past Medical History:  Diagnosis Date   Anxiety    Anxiety disorder    Asthma    Bipolar 1 disorder ([name])    Depression    Methamphetamine abuse ([name])    PTSD (post-traumatic stress disorder)     Past Surgical History:  Procedure Laterality Date   CLOSED REDUCTION MANDIBLE N/A 07/02/2018   Procedure: CLOSED REDUCTION MANDIBULAR;  Surgeon: Vernie Murders, MD;  Location: [facility] ORS;  Service: ENT;  Laterality: N/A;   Family History:  Family History  Problem Relation Age of Onset   Asthma Mother    Cancer Mother        breast cancer   Ulcers Mother    Cancer Father        esophogeal cancer   Hypertension Father    Asthma Brother    Family Psychiatric  History: Unremarkable Social History:  Social History   Substance and Sexual Activity  Alcohol Use Not Currently   Alcohol/week: 13.0 standard drinks of alcohol   Types: 6 Cans of beer, 7 Standard drinks or equivalent per week   Comment: previously heavy drinker 2016. most recent 4 beer/ day drinker and none since 01-11-2020     Social History   Substance and Sexual Activity  Drug Use Yes   Types: Methamphetamines   Comment: states he uses 1 g every other day    Social History    Socioeconomic History   Marital status: Single    Spouse name: Not on file   Number of children: 1   Years of education: 10   Highest education level: 10th grade  Occupational History   Not on file  Tobacco Use   Smoking status: Every Day    Current packs/day: 0.50    Average packs/day: 0.5 packs/day for 8.0 years (4.0 ttl pk-yrs)    Types: Cigarettes   Smokeless tobacco: Never  Vaping Use   Vaping status: Every Day   Substances: Nicotine, Flavoring  Substance and Sexual Activity   Alcohol use: Not Currently    Alcohol/week: 13.0 standard drinks of alcohol    Types: 6 Cans of beer, 7 Standard drinks or equivalent per week    Comment: previously heavy drinker 2016. most recent 4 beer/ day drinker and none since 01-11-2020   Drug use: Yes    Types: Methamphetamines    Comment: states he uses 1 g every other day   Sexual activity: Yes  Other Topics Concern   Not on file  Social History Narrative   Not on file   Social Determinants of Health   Financial Resource Strain: High Risk (05/05/2022)   [facility], [facility]   Overall Financial Resource Strain (CARDIA)    Difficulty of  Paying Living Expenses: Very hard  Food Insecurity: Food Insecurity Present (01/18/2023)   Hunger Vital Sign    Worried About Running Out of Food in the Last Year: Sometimes true    Ran Out of Food in the Last Year: Sometimes true  Transportation Needs: No Transportation Needs (01/18/2023)   PRAPARE - Administrator, Civil Service (Medical): No    Lack of Transportation (Non-Medical): No  Physical Activity: Not on file  Stress: Not on file  Social Connections: Unknown (09/09/2021)   Received from [facility], [facility]   Social Network    Social Network: Not on file   Additional Social History:                         Sleep: Good  Appetite:  Good  Current Medications: Current Facility-Administered Medications   Medication Dose Route Frequency Provider Last Rate Last Admin   acetaminophen (TYLENOL) tablet 650 mg  650 mg Oral Q6H PRN Rankin, Shuvon B, NP   650 mg at 01/22/23 2112   albuterol (VENTOLIN HFA) 108 (90 Base) MCG/ACT inhaler 2 puff  2 puff Inhalation Q4H PRN Sarina Ill, DO   2 puff at 01/21/23 2227   alum & mag hydroxide-simeth (MAALOX/MYLANTA) 200-200-20 MG/5ML suspension 30 mL  30 mL Oral Q4H PRN Rankin, Shuvon B, NP       diphenhydrAMINE (BENADRYL) capsule 50 mg  50 mg Oral TID PRN Rankin, Shuvon B, NP       Or   diphenhydrAMINE (BENADRYL) injection 50 mg  50 mg Intramuscular TID PRN Rankin, Shuvon B, NP       FLUoxetine (PROZAC) capsule 20 mg  20 mg Oral Daily Sarina Ill, DO   20 mg at 01/23/23 1610   hydrocortisone cream 1 %   Topical TID PRN Rankin, Shuvon B, NP       magnesium hydroxide (MILK OF MAGNESIA) suspension 30 mL  30 mL Oral Daily PRN Rankin, Shuvon B, NP       QUEtiapine (SEROQUEL) tablet 150 mg  150 mg Oral QHS Rankin, Shuvon B, NP   150 mg at 01/22/23 2109   risperiDONE (RISPERDAL) tablet 0.5 mg  0.5 mg Oral [id number] Sarina Ill, DO   0.5 mg at 01/23/23 9604   traZODone (DESYREL) tablet 50 mg  50 mg Oral QHS PRN Rankin, Shuvon B, NP   50 mg at 01/20/23 2113    Lab Results: No results found for this or any previous visit (from the past 48 hour(s)).  Blood Alcohol level:  Lab Results  Component Value Date   ETH <10 01/17/2023   ETH <10 12/29/2021    Metabolic Disorder Labs: Lab Results  Component Value Date   HGBA1C 7.2 (H) 01/17/2023   MPG 159.94 01/17/2023   MPG 126 01/17/2021   Lab Results  Component Value Date   PROLACTIN 10.1 01/17/2023   Lab Results  Component Value Date   CHOL 136 01/17/2023   TRIG 101 01/17/2023   HDL 37 (L) 01/17/2023   CHOLHDL 3.7 01/17/2023   VLDL 20 01/17/2023   LDLCALC 79 01/17/2023   LDLCALC 46 01/17/2021    Physical Findings: AIMS:  , ,  ,  ,    CIWA:    COWS:  COWS Total Score:  1  Musculoskeletal: Strength & Muscle Tone: within normal limits Gait & Station: normal Patient leans: N/A  Psychiatric Specialty Exam:  Presentation  General Appearance:  Appropriate  for Environment  Eye Contact: Good  Speech: Clear and Coherent; Normal Rate  Speech Volume: Normal  Handedness: Right   Mood and Affect  Mood: Anxious; Depressed  Affect: Congruent; Depressed   Thought Process  Thought Processes: Coherent; Goal Directed  Descriptions of Associations:Intact  Orientation:Full (Time, Place and Person)  Thought Content:Logical  History of Schizophrenia/Schizoaffective disorder:No  Duration of Psychotic Symptoms:N/A  Hallucinations:No data recorded Ideas of Reference:None  Suicidal Thoughts:No data recorded Homicidal Thoughts:No data recorded  Sensorium  Memory: Immediate Good; Recent Good  Judgment: Fair  Insight: Fair; Present   Executive Functions  Concentration: Good  Attention Span: Good  Recall: Good  Fund of Knowledge: Good  Language: Good   Psychomotor Activity  Psychomotor Activity:No data recorded  Assets  Assets: Communication Skills; Desire for Improvement; Physical Health; Resilience   Sleep  Sleep:No data recorded    Blood pressure 119/73, pulse 88, temperature 98.6 F (37 C), resp. rate 18, height 5\' 9"  (1.753 m), weight 105.2 kg, SpO2 99%. Body mass index is 34.26 kg/m.   Treatment Plan Summary: Daily contact with patient to assess and evaluate symptoms and progress in treatment, Medication management, and Plan continue current medications.  Sarina Ill, DO 01/23/2023, 11:19 AM

## 2023-01-23 NOTE — Plan of Care (Signed)
  Problem: Coping: Goal: Ability to verbalize frustrations and anger appropriately will improve Outcome: Progressing   Problem: Coping: Goal: Ability to demonstrate self-control will improve Outcome: Progressing

## 2023-01-23 NOTE — Plan of Care (Signed)
  Problem: Education: Goal: Knowledge of General Education information will improve Description: Including pain rating scale, medication(s)/side effects and non-pharmacologic comfort measures Outcome: Not Progressing   Problem: Health Behavior/Discharge Planning: Goal: Ability to manage health-related needs will improve Outcome: Not Progressing   Problem: Clinical Measurements: Goal: Ability to maintain clinical measurements within normal limits will improve Outcome: Not Progressing Goal: Will remain free from infection Outcome: Not Progressing Goal: Diagnostic test results will improve Outcome: Not Progressing Goal: Respiratory complications will improve Outcome: Not Progressing Goal: Cardiovascular complication will be avoided Outcome: Not Progressing   Problem: Activity: Goal: Risk for activity intolerance will decrease Outcome: Not Progressing   Problem: Nutrition: Goal: Adequate nutrition will be maintained Outcome: Not Progressing   Problem: Coping: Goal: Level of anxiety will decrease Outcome: Not Progressing   Problem: Elimination: Goal: Will not experience complications related to bowel motility Outcome: Not Progressing Goal: Will not experience complications related to urinary retention Outcome: Not Progressing   Problem: Pain Managment: Goal: General experience of comfort will improve Outcome: Not Progressing   Problem: Safety: Goal: Ability to remain free from injury will improve Outcome: Not Progressing   Problem: Skin Integrity: Goal: Risk for impaired skin integrity will decrease Outcome: Not Progressing   Problem: Education: Goal: Knowledge of [place] [facility] Education information/materials will improve Outcome: Not Progressing Goal: Emotional status will improve Outcome: Not Progressing Goal: Mental status will improve Outcome: Not Progressing Goal: Verbalization of understanding the information provided will improve Outcome: Not  Progressing   Problem: Activity: Goal: Interest or engagement in activities will improve Outcome: Not Progressing Goal: Sleeping patterns will improve Outcome: Not Progressing   Problem: Coping: Goal: Ability to verbalize frustrations and anger appropriately will improve Outcome: Not Progressing Goal: Ability to demonstrate self-control will improve Outcome: Not Progressing   Problem: Health Behavior/Discharge Planning: Goal: Identification of resources available to assist in meeting health care needs will improve Outcome: Not Progressing Goal: Compliance with treatment plan for underlying cause of condition will improve Outcome: Not Progressing   Problem: Physical Regulation: Goal: Ability to maintain clinical measurements within normal limits will improve Outcome: Not Progressing   Problem: Safety: Goal: Periods of time without injury will increase Outcome: Not Progressing   Problem: Education: Goal: Knowledge of [place] [facility] Education information/materials will improve Outcome: Not Progressing Goal: Emotional status will improve Outcome: Not Progressing Goal: Mental status will improve Outcome: Not Progressing Goal: Verbalization of understanding the information provided will improve Outcome: Not Progressing   Problem: Coping: Goal: Ability to verbalize frustrations and anger appropriately will improve Outcome: Not Progressing Goal: Ability to demonstrate self-control will improve Outcome: Not Progressing   Problem: Health Behavior/Discharge Planning: Goal: Identification of resources available to assist in meeting health care needs will improve Outcome: Not Progressing Goal: Compliance with treatment plan for underlying cause of condition will improve Outcome: Not Progressing   Problem: Physical Regulation: Goal: Ability to maintain clinical measurements within normal limits will improve Outcome: Not Progressing   Problem: Safety: Goal: Periods of  time without injury will increase Outcome: Not Progressing

## 2023-01-24 DIAGNOSIS — F1994 Other psychoactive substance use, unspecified with psychoactive substance-induced mood disorder: Secondary | ICD-10-CM | POA: Diagnosis not present

## 2023-01-24 NOTE — Progress Notes (Signed)
No distress noted thoughts ae organized and coherent, he denies SI/HI/AVH, interacting appropriately with peers and staff complaint with medication regimen and he attended the wrap up group. 15 minutes safety checks maintained will continue to monitor.

## 2023-01-24 NOTE — Plan of Care (Signed)
  Problem: Safety: Goal: Ability to remain free from injury will improve Outcome: Progressing   Problem: Education: Goal: Emotional status will improve Outcome: Progressing   Problem: Health Behavior/Discharge Planning: Goal: Compliance with treatment plan for underlying cause of condition will improve Outcome: Progressing

## 2023-01-24 NOTE — Progress Notes (Signed)
Patient med compliant. Patient denies SI,HI, and A/V/H with no plan or intent. Patient cooperative on unit.    01/24/23 0850  Psych Admission Type (Psych Patients Only)  Admission Status Voluntary  Psychosocial Assessment  Patient Complaints None  Eye Contact Fair  Facial Expression Flat;Sad  Affect Appropriate to circumstance  Speech Logical/coherent  Interaction Assertive  Motor Activity Slow  Appearance/Hygiene Disheveled  Behavior Characteristics Cooperative  Mood Depressed;Pleasant  Thought Process  Coherency WDL  Content WDL  Delusions None reported or observed  Perception WDL  Hallucination None reported or observed  Judgment WDL  Confusion None  Danger to Self  Current suicidal ideation? Denies  Self-Injurious Behavior No self-injurious ideation or behavior indicators observed or expressed   Agreement Not to Harm Self Yes  Description of Agreement verbal  Danger to Others  Danger to Others None reported or observed

## 2023-01-24 NOTE — Group Note (Signed)
[facility] LCSW Group Therapy Note   Group Date: 01/24/2023 Start Time: 1300 End Time: 1340   Type of Therapy/Topic:  Group Therapy:  Balance in Life  Participation Level:  Active   Description of Group:    This group will address the concept of balance and how it feels and looks when one is unbalanced. Patients will be encouraged to process areas in their lives that are out of balance, and identify reasons for remaining unbalanced. Facilitators will guide patients utilizing problem- solving interventions to address and correct the stressor making their life unbalanced. Understanding and applying boundaries will be explored and addressed for obtaining  and maintaining a balanced life. Patients will be encouraged to explore ways to assertively make their unbalanced needs known to significant others in their lives, using other group members and facilitator for support and feedback.  Therapeutic Goals: Patient will identify two or more emotions or situations they have that consume much of in their lives. Patient will identify signs/triggers that life has become out of balance:  Patient will identify two ways to set boundaries in order to achieve balance in their lives:  Patient will demonstrate ability to communicate their needs through discussion and/or role plays  Summary of Patient Progress:   The patient attended group. Patient proved open to input from peers and feedback from [facility]. The patient was respectful of peers and participated throughout the entire session. The patient participated during today's icebreaker questions. The patient stated that he like to go to the park and think about his past and childhood.        Marshell Levan, LCSW

## 2023-01-24 NOTE — Progress Notes (Signed)
[facility] MD Progress Note  01/24/2023 11:45 AM Nathan Koch  MRN:  [id number] Subjective:  Nathan Koch is seen on rounds.  He is awaiting to hear from a drug and alcohol rehab.  He says that his mood is good.  He does not have any complaints.  Nurses report no issues.  He is tolerating the medications without any side effects.  He interacts well with staff and peers. Principal Problem: Other psychoactive substance-induced mood disorder ([name]) Diagnosis: Principal Problem:   Other psychoactive substance-induced mood disorder ([name])  Total Time spent with patient: 15 minutes  Past Psychiatric History: Depression and polysubstance abuse  Past Medical History:  Past Medical History:  Diagnosis Date   Anxiety    Anxiety disorder    Asthma    Bipolar 1 disorder ([name])    Depression    Methamphetamine abuse ([name])    PTSD (post-traumatic stress disorder)     Past Surgical History:  Procedure Laterality Date   CLOSED REDUCTION MANDIBLE N/A 07/02/2018   Procedure: CLOSED REDUCTION MANDIBULAR;  Surgeon: Vernie Murders, MD;  Location: [facility] ORS;  Service: ENT;  Laterality: N/A;   Family History:  Family History  Problem Relation Age of Onset   Asthma Mother    Cancer Mother        breast cancer   Ulcers Mother    Cancer Father        esophogeal cancer   Hypertension Father    Asthma Brother    Family Psychiatric  History: Unremarkable Social History:  Social History   Substance and Sexual Activity  Alcohol Use Not Currently   Alcohol/week: 13.0 standard drinks of alcohol   Types: 6 Cans of beer, 7 Standard drinks or equivalent per week   Comment: previously heavy drinker 2016. most recent 4 beer/ day drinker and none since 01-11-2020     Social History   Substance and Sexual Activity  Drug Use Yes   Types: Methamphetamines   Comment: states he uses 1 g every other day    Social History   Socioeconomic History   Marital status: Single    Spouse name: Not on file   Number of  children: 1   Years of education: 10   Highest education level: 10th grade  Occupational History   Not on file  Tobacco Use   Smoking status: Every Day    Current packs/day: 0.50    Average packs/day: 0.5 packs/day for 8.0 years (4.0 ttl pk-yrs)    Types: Cigarettes   Smokeless tobacco: Never  Vaping Use   Vaping status: Every Day   Substances: Nicotine, Flavoring  Substance and Sexual Activity   Alcohol use: Not Currently    Alcohol/week: 13.0 standard drinks of alcohol    Types: 6 Cans of beer, 7 Standard drinks or equivalent per week    Comment: previously heavy drinker 2016. most recent 4 beer/ day drinker and none since 01-11-2020   Drug use: Yes    Types: Methamphetamines    Comment: states he uses 1 g every other day   Sexual activity: Yes  Other Topics Concern   Not on file  Social History Narrative   Not on file   Social Determinants of Health   Financial Resource Strain: High Risk (05/05/2022)   [facility], [facility]   Overall Financial Resource Strain (CARDIA)    Difficulty of Paying Living Expenses: Very hard  Food Insecurity: Food Insecurity Present (01/18/2023)   Hunger Vital Sign  Worried About Programme researcher, broadcasting/film/video in the Last Year: Sometimes true    [employer] of Food in the Last Year: Sometimes true  Transportation Needs: No Transportation Needs (01/18/2023)   PRAPARE - Administrator, Civil Service (Medical): No    Lack of Transportation (Non-Medical): No  Physical Activity: Not on file  Stress: Not on file  Social Connections: Unknown (09/09/2021)   Received from [facility], [facility]   Social Network    Social Network: Not on file   Additional Social History:                         Sleep: Good  Appetite:  Good  Current Medications: Current Facility-Administered Medications  Medication Dose Route Frequency Provider Last Rate Last Admin   acetaminophen (TYLENOL)  tablet 650 mg  650 mg Oral Q6H PRN Rankin, Shuvon B, NP   650 mg at 01/22/23 2112   albuterol (VENTOLIN HFA) 108 (90 Base) MCG/ACT inhaler 2 puff  2 puff Inhalation Q4H PRN Sarina Ill, DO   2 puff at 01/21/23 2227   alum & mag hydroxide-simeth (MAALOX/MYLANTA) 200-200-20 MG/5ML suspension 30 mL  30 mL Oral Q4H PRN Rankin, Shuvon B, NP       diphenhydrAMINE (BENADRYL) capsule 50 mg  50 mg Oral TID PRN Rankin, Shuvon B, NP       Or   diphenhydrAMINE (BENADRYL) injection 50 mg  50 mg Intramuscular TID PRN Rankin, Shuvon B, NP       FLUoxetine (PROZAC) capsule 20 mg  20 mg Oral Daily Sarina Ill, DO   20 mg at 01/24/23 7829   hydrocortisone cream 1 %   Topical TID PRN Rankin, Shuvon B, NP       magnesium hydroxide (MILK OF MAGNESIA) suspension 30 mL  30 mL Oral Daily PRN Rankin, Shuvon B, NP       QUEtiapine (SEROQUEL) tablet 150 mg  150 mg Oral QHS Rankin, Shuvon B, NP   150 mg at 01/23/23 2113   risperiDONE (RISPERDAL) tablet 0.5 mg  0.5 mg Oral BH-q8a4p Sarina Ill, DO   0.5 mg at 01/24/23 0851   traZODone (DESYREL) tablet 50 mg  50 mg Oral QHS PRN Rankin, Shuvon B, NP   50 mg at 01/23/23 2113    Lab Results: No results found for this or any previous visit (from the past 48 hour(s)).  Blood Alcohol level:  Lab Results  Component Value Date   ETH <10 01/17/2023   ETH <10 12/29/2021    Metabolic Disorder Labs: Lab Results  Component Value Date   HGBA1C 7.2 (H) 01/17/2023   MPG 159.94 01/17/2023   MPG 126 01/17/2021   Lab Results  Component Value Date   PROLACTIN 10.1 01/17/2023   Lab Results  Component Value Date   CHOL 136 01/17/2023   TRIG 101 01/17/2023   HDL 37 (L) 01/17/2023   CHOLHDL 3.7 01/17/2023   VLDL 20 01/17/2023   LDLCALC 79 01/17/2023   LDLCALC 46 01/17/2021    Physical Findings: AIMS:  , ,  ,  ,    CIWA:    COWS:  COWS Total Score: 0  Musculoskeletal: Strength & Muscle Tone: within normal limits Gait & Station:  normal Patient leans: N/A  Psychiatric Specialty Exam:  Presentation  General Appearance:  Appropriate for Environment  Eye Contact: Good  Speech: Clear and Coherent; Normal Rate  Speech Volume: Normal  Handedness: Right  Mood and Affect  Mood: Anxious; Depressed  Affect: Congruent; Depressed   Thought Process  Thought Processes: Coherent; Goal Directed  Descriptions of Associations:Intact  Orientation:Full (Time, Place and Person)  Thought Content:Logical  History of Schizophrenia/Schizoaffective disorder:No  Duration of Psychotic Symptoms:N/A  Hallucinations:No data recorded Ideas of Reference:None  Suicidal Thoughts:No data recorded Homicidal Thoughts:No data recorded  Sensorium  Memory: Immediate Good; Recent Good  Judgment: Fair  Insight: Fair; Present   Executive Functions  Concentration: Good  Attention Span: Good  Recall: Good  Fund of Knowledge: Good  Language: Good   Psychomotor Activity  Psychomotor Activity:No data recorded  Assets  Assets: Communication Skills; Desire for Improvement; Physical Health; Resilience   Sleep  Sleep:No data recorded   MENTAL STATUS EXAM: Patient is alert and oriented x 3, pleasant and cooperative, good eye contact, speech is normal and not pressured, mood is depressed; affect is flat; thought process: goal directed; thought content: no suicidal ideation; judgment is good, insight is good.  Blood pressure 105/72, pulse 70, temperature 97.8 F (36.6 C), temperature source Oral, resp. rate 18, height 5\' 9"  (1.753 m), weight 105.2 kg, SpO2 100%. Body mass index is 34.26 kg/m.   Treatment Plan Summary: Daily contact with patient to assess and evaluate symptoms and progress in treatment, Medication management, and Plan continue current medications.  Sarina Ill, DO 01/24/2023, 11:45 AM

## 2023-01-24 NOTE — Group Note (Signed)
Date:  01/24/2023 Time:  3:25 PM  Group Topic/Focus:  Activity Group:  The focus of the group to promote activity for the patients to go outside in the courtyard and get some exercise and some fresh air.    Participation Level:  Active  Participation Quality:  Appropriate  Affect:  Appropriate  Cognitive:  Appropriate  Insight: Appropriate  Engagement in Group:  Engaged  Modes of Intervention:  Activity  Additional Comments:    Nathan Koch 01/24/2023, 3:25 PM

## 2023-01-24 NOTE — Group Note (Signed)
Date:  01/24/2023 Time:  11:10 PM  Group Topic/Focus:  Wrap-Up Group:   The focus of this group is to help patients review their daily goal of treatment and discuss progress on daily workbooks.    Participation Level:  Active  Participation Quality:  Appropriate  Affect:  Appropriate  Cognitive:  Appropriate  Insight: Appropriate  Engagement in Group:  Engaged  Modes of Intervention:  Discussion   Lenore Cordia 01/24/2023, 11:10 PM

## 2023-01-24 NOTE — Plan of Care (Signed)
  Problem: Activity: Goal: Risk for activity intolerance will decrease Outcome: Progressing   Problem: Education: Goal: Knowledge of General Education information will improve Description: Including pain rating scale, medication(s)/side effects and non-pharmacologic comfort measures Outcome: Progressing   

## 2023-01-25 DIAGNOSIS — F1994 Other psychoactive substance use, unspecified with psychoactive substance-induced mood disorder: Secondary | ICD-10-CM | POA: Diagnosis not present

## 2023-01-25 NOTE — BHH Counselor (Signed)
CSW received returned call from [facility] [place].   Sherry to email nurses at [facility] so they can review pt's case and give CSW a call back if he is accepted    Reynaldo Minium, MSW, [facility] 01/25/2023 2:36 PM

## 2023-01-25 NOTE — BHH Counselor (Signed)
CSW contacted [facility] per request of pt.   Pt states that he completed a pre-screen this morning and thinks he will be accepted to the program.   CSW contacted [facility], they state they have not completed a pre-screen with pt.   CSW called over to [facility] unit to verify where CSW was supposed to be calling to ensure CSW contacted the right program.   Pt's states CSW was to call [facility].   CSW called [facility] at [phone]  [facility] states that pt did not complete pre-screen and that his profile has not been updates since 2022.   CSW left VM with Cordelia Pen, requesting return call.   Reynaldo Minium, MSW, [place] 01/25/2023 2:24 PM

## 2023-01-25 NOTE — Progress Notes (Signed)
Pt calm and pleasant during assessment denying SI/HI/AVH. Pt stated he wanted to sleep tonight. Pt compliant with medication administration per MD orders. Pt given education, support, and encouragement to be active in his treatment plan. Pt being monitored Q 15 minutes for safety per unit protocol, remains safe on the unit

## 2023-01-25 NOTE — Group Note (Signed)
Date:  01/25/2023 Time:  9:15 AM  Group Topic/Focus:  Goals Group:   The focus of this group is to help patients establish daily goals to achieve during treatment and discuss how the patient can incorporate goal setting into their daily lives to aide in recovery.   Participation Level:  Did Not Attend  Pyper Olexa A Miles Borkowski 01/25/2023, 7:21 PM

## 2023-01-25 NOTE — Progress Notes (Signed)
Pt stated that he was accepted to [facility] for rehab, but they don't have a bed available until Monday 01/26/23. Pt is hopeful he can stay here until then because he feels like if he leaves without placement he will relapse. Pt given education, support, and encouragement.

## 2023-01-25 NOTE — Progress Notes (Signed)
Patient alert and oriented x 4, affect is flat thoughts are organized, no distress noted , he denies SI/HI/AVH interacting appropriately with peers and staff complaint with medication regimen and he attended the wrap up group. Patient was offered emotional support, 15 minutes safety checks maintained will continue to monitor.

## 2023-01-25 NOTE — BH IP Treatment Plan (Signed)
Interdisciplinary Treatment and Diagnostic Plan Update  01/25/2023 Time of Session: 9:30 AM  Nathan Koch MRN: [id number]  Principal Diagnosis: Other psychoactive substance-induced mood disorder ([name])  Secondary Diagnoses: Principal Problem:   Other psychoactive substance-induced mood disorder ([name])   Current Medications:  Current Facility-Administered Medications  Medication Dose Route Frequency Provider Last Rate Last Admin   acetaminophen (TYLENOL) tablet 650 mg  650 mg Oral Q6H PRN Rankin, Shuvon B, NP   650 mg at 01/22/23 2112   albuterol (VENTOLIN HFA) 108 (90 Base) MCG/ACT inhaler 2 puff  2 puff Inhalation Q4H PRN Sarina Ill, DO   2 puff at 01/24/23 2141   alum & mag hydroxide-simeth (MAALOX/MYLANTA) 200-200-20 MG/5ML suspension 30 mL  30 mL Oral Q4H PRN Rankin, Shuvon B, NP       diphenhydrAMINE (BENADRYL) capsule 50 mg  50 mg Oral TID PRN Rankin, Shuvon B, NP       Or   diphenhydrAMINE (BENADRYL) injection 50 mg  50 mg Intramuscular TID PRN Rankin, Shuvon B, NP       FLUoxetine (PROZAC) capsule 20 mg  20 mg Oral Daily Sarina Ill, DO   20 mg at 01/25/23 4098   hydrocortisone cream 1 %   Topical TID PRN Rankin, Shuvon B, NP       magnesium hydroxide (MILK OF MAGNESIA) suspension 30 mL  30 mL Oral Daily PRN Rankin, Shuvon B, NP       QUEtiapine (SEROQUEL) tablet 150 mg  150 mg Oral QHS Rankin, Shuvon B, NP   150 mg at 01/24/23 2143   risperiDONE (RISPERDAL) tablet 0.5 mg  0.5 mg Oral BH-q8a4p Sarina Ill, DO   0.5 mg at 01/25/23 1191   traZODone (DESYREL) tablet 50 mg  50 mg Oral QHS PRN Rankin, Shuvon B, NP   50 mg at 01/24/23 2143   PTA Medications: Medications Prior to Admission  Medication Sig Dispense Refill Last Dose   albuterol (VENTOLIN HFA) 108 (90 Base) MCG/ACT inhaler Inhale 2 puffs into the lungs every 6 (six) hours as needed for shortness of breath. 6.7 g 1    gabapentin (NEURONTIN) 400 MG capsule Take 400 mg by mouth 3  (three) times daily.      hydrOXYzine (VISTARIL) 50 MG capsule Take 50 mg by mouth daily as needed.      lamoTRIgine (LAMICTAL) 25 MG tablet Take 2 tablets (50 mg total) by mouth daily. 60 tablet 1    metFORMIN (GLUCOPHAGE-XR) 500 MG 24 hr tablet Take 1 tablet by mouth daily with breakfast. (Patient not taking: Reported on 01/15/2023)      QUEtiapine Fumarate 150 MG TABS Take 1 tablet by mouth daily.       Patient Stressors: Financial difficulties   Medication change or noncompliance   Substance abuse   Traumatic event    Patient Strengths: Average or above average intelligence  Communication skills  Physical Health   Treatment Modalities: Medication Management, Group therapy, Case management,  1 to 1 session with clinician, Psychoeducation, Recreational therapy.   Physician Treatment Plan for Primary Diagnosis: Other psychoactive substance-induced mood disorder ([name]) Long Term Goal(s): Improvement in symptoms so as ready for discharge   Short Term Goals: Ability to identify changes in lifestyle to reduce recurrence of condition will improve Ability to verbalize feelings will improve Ability to disclose and discuss suicidal ideas Ability to demonstrate self-control will improve Ability to identify and develop effective coping behaviors will improve Ability to maintain clinical measurements within normal  limits will improve Compliance with prescribed medications will improve Ability to identify triggers associated with substance abuse/mental health issues will improve  Medication Management: Evaluate patient's response, side effects, and tolerance of medication regimen.  Therapeutic Interventions: 1 to 1 sessions, Unit Group sessions and Medication administration.  Evaluation of Outcomes: Progressing  Physician Treatment Plan for Secondary Diagnosis: Principal Problem:   Other psychoactive substance-induced mood disorder ([name])  Long Term Goal(s): Improvement in symptoms so as  ready for discharge   Short Term Goals: Ability to identify changes in lifestyle to reduce recurrence of condition will improve Ability to verbalize feelings will improve Ability to disclose and discuss suicidal ideas Ability to demonstrate self-control will improve Ability to identify and develop effective coping behaviors will improve Ability to maintain clinical measurements within normal limits will improve Compliance with prescribed medications will improve Ability to identify triggers associated with substance abuse/mental health issues will improve     Medication Management: Evaluate patient's response, side effects, and tolerance of medication regimen.  Therapeutic Interventions: 1 to 1 sessions, Unit Group sessions and Medication administration.  Evaluation of Outcomes: Progressing   RN Treatment Plan for Primary Diagnosis: Other psychoactive substance-induced mood disorder ([name]) Long Term Goal(s): Knowledge of disease and therapeutic regimen to maintain health will improve  Short Term Goals: Ability to remain free from injury will improve, Ability to verbalize frustration and anger appropriately will improve, Ability to demonstrate self-control, Ability to participate in decision making will improve, Ability to verbalize feelings will improve, Ability to disclose and discuss suicidal ideas, Ability to identify and develop effective coping behaviors will improve, and Compliance with prescribed medications will improve  Medication Management: RN will administer medications as ordered by provider, will assess and evaluate patient's response and provide education to patient for prescribed medication. RN will report any adverse and/or side effects to prescribing provider.  Therapeutic Interventions: 1 on 1 counseling sessions, Psychoeducation, Medication administration, Evaluate responses to treatment, Monitor vital signs and CBGs as ordered, Perform/monitor CIWA, COWS, AIMS and Fall  Risk screenings as ordered, Perform wound care treatments as ordered.  Evaluation of Outcomes: Progressing   LCSW Treatment Plan for Primary Diagnosis: Other psychoactive substance-induced mood disorder ([name]) Long Term Goal(s): Safe transition to appropriate next level of care at discharge, Engage patient in therapeutic group addressing interpersonal concerns.  Short Term Goals: Engage patient in aftercare planning with referrals and resources, Increase social support, Increase ability to appropriately verbalize feelings, Increase emotional regulation, Facilitate acceptance of mental health diagnosis and concerns, Facilitate patient progression through stages of change regarding substance use diagnoses and concerns, Identify triggers associated with mental health/substance abuse issues, and Increase skills for wellness and recovery  Therapeutic Interventions: Assess for all discharge needs, 1 to 1 time with Social worker, Explore available resources and support systems, Assess for adequacy in community support network, Educate family and significant other(s) on suicide prevention, Complete Psychosocial Assessment, Interpersonal group therapy.  Evaluation of Outcomes: Progressing   Progress in Treatment: Attending groups: Yes. and No. Participating in groups: Yes. and No. Taking medication as prescribed: Yes. Toleration medication: Yes. Family/Significant other contact made: No, will contact:  CSW will contact if given permission  Patient understands diagnosis: Yes. Discussing patient identified problems/goals with staff: Yes. Medical problems stabilized or resolved: Yes. Denies suicidal/homicidal ideation: No. Issues/concerns per patient self-inventory: No. Other: None    New problem(s) identified: No, Describe:  None identified Update 01/25/2023: No changes at this time    New Short Term/Long Term Goal(s): detox, elimination  of symptoms of psychosis, medication management for mood  stabilization; elimination of SI thoughts; development of comprehensive mental wellness/sobriety plan.    Patient Goals:  " I want treatment" Update 01/25/2023: No changes at this time    Discharge Plan or Barriers: CSW will assist with appropriate discharge planning Update 01/25/2023: No changes at this time    Reason for Continuation of Hospitalization: Depression Medication stabilization Suicidal ideation   Estimated Length of Stay: 1 to 7 days Update 01/25/2023: No changes at this time   Last 3 [place] Suicide Severity Risk Score: Flowsheet Row Admission (Current) from 01/18/2023 in [facility] ED from 01/17/2023 in [facility] ED from 01/14/2023 in [facility] Emergency Department at [facility]  C-SSRS RISK CATEGORY Low Risk Moderate Risk Low Risk       Last [facility] 2/9 Scores:    04/15/2021    2:16 AM 02/05/2021    4:55 AM 01/15/2021    8:04 AM  Depression screen PHQ 2/9  Decreased Interest 1 2 1   Down, Depressed, Hopeless 1 2 1   PHQ - 2 Score 2 4 2   Altered sleeping 2 0 0  Tired, decreased energy 2 1 0  Change in appetite 2 0 0  Feeling bad or failure about yourself  2 1 2   Trouble concentrating 2 1 0  Moving slowly or fidgety/restless 2 0   Suicidal thoughts 2 2 2   PHQ-9 Score 16 9 6   Difficult doing work/chores Very difficult Extremely dIfficult     Scribe for Treatment Team: [name] Rafter, [place] 01/25/2023 10:19 AM

## 2023-01-25 NOTE — Plan of Care (Signed)
Patient appropriate with staff & peers. Denies SI,HI and AVH. No issues verbalized. Appetite and energy level good. Support and encouragement given.

## 2023-01-25 NOTE — Group Note (Signed)
Recreation Therapy Group Note   Group Topic:General Recreation  Group Date: 01/25/2023 Start Time: 1040 End Time: 1130 Facilitators: Rosina Lowenstein, LRT, CTRS Location: Courtyard  Group Description: Outdoor Recreation. Patients had the option to play basketball, corn hole, or draw with chalk and listen to music while outside in the courtyard getting fresh air and sunlight. LRT and pts discussed things that they enjoy doing in their free time outside of the hospital.   Goal Area(s) Addressed: Patient will identify leisure interests.  Patient will practice healthy decision making. Patient will engage in recreation activity.   Affect/Mood: Appropriate   Participation Level: Minimal    Clinical Observations/Individualized Feedback: Mikey came late to group with 5 minutes remaining. He sat outside until it was time to go back in.   Plan: Continue to engage patient in RT group sessions 2-3x/week.   Rosina Lowenstein, LRT, CTRS 01/25/2023 12:02 PM

## 2023-01-25 NOTE — Plan of Care (Signed)
  Problem: Education: Goal: Knowledge of General Education information will improve Description: Including pain rating scale, medication(s)/side effects and non-pharmacologic comfort measures Outcome: Progressing   Problem: Coping: Goal: Level of anxiety will decrease Outcome: Progressing   

## 2023-01-25 NOTE — Progress Notes (Signed)
[facility] MD Progress Note  01/25/2023 2:00 PM Nathan Koch  MRN:  [id number] Subjective: Nathan Koch is seen on rounds.  He is awaiting to hear from [facility].  He had an interview.  Social work states that they are going to follow up with him.  He is tolerating his medications without any problems.  Nurses report no issues.  He has been compliant with medications.  No side effects.  He denies any suicidal ideation. Principal Problem: Other psychoactive substance-induced mood disorder ([name]) Diagnosis: Principal Problem:   Other psychoactive substance-induced mood disorder ([name])  Total Time spent with patient: 15 minutes  Past Psychiatric History: This is his polysubstance abuse and depression  Past Medical History:  Past Medical History:  Diagnosis Date   Anxiety    Anxiety disorder    Asthma    Bipolar 1 disorder ([name])    Depression    Methamphetamine abuse ([name])    PTSD (post-traumatic stress disorder)     Past Surgical History:  Procedure Laterality Date   CLOSED REDUCTION MANDIBLE N/A 07/02/2018   Procedure: CLOSED REDUCTION MANDIBULAR;  Surgeon: Vernie Murders, MD;  Location: [facility] ORS;  Service: ENT;  Laterality: N/A;   Family History:  Family History  Problem Relation Age of Onset   Asthma Mother    Cancer Mother        breast cancer   Ulcers Mother    Cancer Father        esophogeal cancer   Hypertension Father    Asthma Brother    Family Psychiatric  History: Unremarkable Social History:  Social History   Substance and Sexual Activity  Alcohol Use Not Currently   Alcohol/week: 13.0 standard drinks of alcohol   Types: 6 Cans of beer, 7 Standard drinks or equivalent per week   Comment: previously heavy drinker 2016. most recent 4 beer/ day drinker and none since 01-11-2020     Social History   Substance and Sexual Activity  Drug Use Yes   Types: Methamphetamines   Comment: states he uses 1 g every other day    Social History   Socioeconomic History   Marital  status: Single    Spouse name: Not on file   Number of children: 1   Years of education: 10   Highest education level: 10th grade  Occupational History   Not on file  Tobacco Use   Smoking status: Every Day    Current packs/day: 0.50    Average packs/day: 0.5 packs/day for 8.0 years (4.0 ttl pk-yrs)    Types: Cigarettes   Smokeless tobacco: Never  Vaping Use   Vaping status: Every Day   Substances: Nicotine, Flavoring  Substance and Sexual Activity   Alcohol use: Not Currently    Alcohol/week: 13.0 standard drinks of alcohol    Types: 6 Cans of beer, 7 Standard drinks or equivalent per week    Comment: previously heavy drinker 2016. most recent 4 beer/ day drinker and none since 01-11-2020   Drug use: Yes    Types: Methamphetamines    Comment: states he uses 1 g every other day   Sexual activity: Yes  Other Topics Concern   Not on file  Social History Narrative   Not on file   Social Determinants of Health   Financial Resource Strain: High Risk (05/05/2022)   Received from [facility], [facility]   Overall Financial Resource Strain (CARDIA)    Difficulty of Paying Living Expenses: Very hard  Food  Insecurity: Food Insecurity Present (01/18/2023)   Hunger Vital Sign    Worried About Running Out of Food in the Last Year: Sometimes true    Ran Out of Food in the Last Year: Sometimes true  Transportation Needs: No Transportation Needs (01/18/2023)   PRAPARE - Administrator, Civil Service (Medical): No    Lack of Transportation (Non-Medical): No  Physical Activity: Not on file  Stress: Not on file  Social Connections: Unknown (09/09/2021)   Received from [facility], [facility]   Social Network    Social Network: Not on file   Additional Social History:                         Sleep: Good  Appetite: Good  Current Medications: Current Facility-Administered Medications  Medication Dose Route Frequency  Provider Last Rate Last Admin   acetaminophen (TYLENOL) tablet 650 mg  650 mg Oral Q6H PRN Rankin, Shuvon B, NP   650 mg at 01/22/23 2112   albuterol (VENTOLIN HFA) 108 (90 Base) MCG/ACT inhaler 2 puff  2 puff Inhalation Q4H PRN Sarina Ill, DO   2 puff at 01/24/23 2141   alum & mag hydroxide-simeth (MAALOX/MYLANTA) 200-200-20 MG/5ML suspension 30 mL  30 mL Oral Q4H PRN Rankin, Shuvon B, NP       diphenhydrAMINE (BENADRYL) capsule 50 mg  50 mg Oral TID PRN Rankin, Shuvon B, NP       Or   diphenhydrAMINE (BENADRYL) injection 50 mg  50 mg Intramuscular TID PRN Rankin, Shuvon B, NP       FLUoxetine (PROZAC) capsule 20 mg  20 mg Oral Daily Sarina Ill, DO   20 mg at 01/25/23 4259   hydrocortisone cream 1 %   Topical TID PRN Rankin, Shuvon B, NP       magnesium hydroxide (MILK OF MAGNESIA) suspension 30 mL  30 mL Oral Daily PRN Rankin, Shuvon B, NP       QUEtiapine (SEROQUEL) tablet 150 mg  150 mg Oral QHS Rankin, Shuvon B, NP   150 mg at 01/24/23 2143   risperiDONE (RISPERDAL) tablet 0.5 mg  0.5 mg Oral BH-q8a4p Sarina Ill, DO   0.5 mg at 01/25/23 5638   traZODone (DESYREL) tablet 50 mg  50 mg Oral QHS PRN Rankin, Shuvon B, NP   50 mg at 01/24/23 2143    Lab Results: No results found for this or any previous visit (from the past 48 hour(s)).  Blood Alcohol level:  Lab Results  Component Value Date   ETH <10 01/17/2023   ETH <10 12/29/2021    Metabolic Disorder Labs: Lab Results  Component Value Date   HGBA1C 7.2 (H) 01/17/2023   MPG 159.94 01/17/2023   MPG 126 01/17/2021   Lab Results  Component Value Date   PROLACTIN 10.1 01/17/2023   Lab Results  Component Value Date   CHOL 136 01/17/2023   TRIG 101 01/17/2023   HDL 37 (L) 01/17/2023   CHOLHDL 3.7 01/17/2023   VLDL 20 01/17/2023   LDLCALC 79 01/17/2023   LDLCALC 46 01/17/2021    Physical Findings: AIMS:  , ,  ,  ,    CIWA:    COWS:  COWS Total Score: 0  Musculoskeletal: Strength &  Muscle Tone: within normal limits Gait & Station: normal Patient leans: Right  Psychiatric Specialty Exam:  Presentation  General Appearance:  Appropriate for Environment  Eye Contact: Good  Speech:  Clear and Coherent; Normal Rate  Speech Volume: Normal  Handedness: Right   Mood and Affect  Mood: Anxious; Depressed  Affect: Congruent; Depressed   Thought Process  Thought Processes: Coherent; Goal Directed  Descriptions of Associations:Intact  Orientation:Full (Time, Place and Person)  Thought Content:Logical  History of Schizophrenia/Schizoaffective disorder:No  Duration of Psychotic Symptoms:N/A  Hallucinations:No data recorded Ideas of Reference:None  Suicidal Thoughts:No data recorded Homicidal Thoughts:No data recorded  Sensorium  Memory: Immediate Good; Recent Good  Judgment: Fair  Insight: Fair; Present   Executive Functions  Concentration: Good  Attention Span: Good  Recall: Good  Fund of Knowledge: Good  Language: Good   Psychomotor Activity  Psychomotor Activity:No data recorded  Assets  Assets: Communication Skills; Desire for Improvement; Physical Health; Resilience   Sleep  Sleep:No data recorded   MENTAL STATUS EXAM: Patient is alert and oriented x 3, pleasant and cooperative, good eye contact, speech is normal and not pressured, mood is depressed; affect is flat; thought process: goal directed; thought content: No suicidal ideation; judgment is poor, insight is poor. Blood pressure 100/61, pulse 78, temperature 97.8 F (36.6 C), temperature source Oral, resp. rate 18, height 5\' 9"  (1.753 m), weight 105.2 kg, SpO2 98%. Body mass index is 34.26 kg/m.   Treatment Plan Summary: Daily contact with patient to assess and evaluate symptoms and progress in treatment, Medication management, and Plan continue current medications.  Brok Stocking [place], DO 01/25/2023, 2:00 PM

## 2023-01-26 NOTE — Plan of Care (Signed)
Pt compliant with procedures on the unit, waiting safe discharge planning  Problem: Education: Goal: Knowledge of General Education information will improve Description: Including pain rating scale, medication(s)/side effects and non-pharmacologic comfort measures Outcome: Progressing   Problem: Health Behavior/Discharge Planning: Goal: Ability to manage health-related needs will improve Outcome: Progressing   Problem: Clinical Measurements: Goal: Ability to maintain clinical measurements within normal limits will improve Outcome: Progressing Goal: Will remain free from infection Outcome: Progressing Goal: Diagnostic test results will improve Outcome: Progressing Goal: Respiratory complications will improve Outcome: Progressing Goal: Cardiovascular complication will be avoided Outcome: Progressing   Problem: Activity: Goal: Risk for activity intolerance will decrease Outcome: Progressing   Problem: Nutrition: Goal: Adequate nutrition will be maintained Outcome: Progressing   Problem: Coping: Goal: Level of anxiety will decrease Outcome: Progressing   Problem: Elimination: Goal: Will not experience complications related to bowel motility Outcome: Progressing Goal: Will not experience complications related to urinary retention Outcome: Progressing   Problem: Pain Managment: Goal: General experience of comfort will improve Outcome: Progressing   Problem: Safety: Goal: Ability to remain free from injury will improve Outcome: Progressing   Problem: Skin Integrity: Goal: Risk for impaired skin integrity will decrease Outcome: Progressing   Problem: Education: Goal: Knowledge of [place] [facility] Education information/materials will improve Outcome: Progressing Goal: Emotional status will improve Outcome: Progressing Goal: Mental status will improve Outcome: Progressing Goal: Verbalization of understanding the information provided will improve Outcome:  Progressing   Problem: Activity: Goal: Interest or engagement in activities will improve Outcome: Progressing Goal: Sleeping patterns will improve Outcome: Progressing   Problem: Coping: Goal: Ability to verbalize frustrations and anger appropriately will improve Outcome: Progressing Goal: Ability to demonstrate self-control will improve Outcome: Progressing   Problem: Health Behavior/Discharge Planning: Goal: Identification of resources available to assist in meeting health care needs will improve Outcome: Progressing Goal: Compliance with treatment plan for underlying cause of condition will improve Outcome: Progressing   Problem: Physical Regulation: Goal: Ability to maintain clinical measurements within normal limits will improve Outcome: Progressing   Problem: Safety: Goal: Periods of time without injury will increase Outcome: Progressing   Problem: Education: Goal: Knowledge of [place] [facility] Education information/materials will improve Outcome: Progressing Goal: Emotional status will improve Outcome: Progressing Goal: Mental status will improve Outcome: Progressing Goal: Verbalization of understanding the information provided will improve Outcome: Progressing   Problem: Coping: Goal: Ability to verbalize frustrations and anger appropriately will improve Outcome: Progressing Goal: Ability to demonstrate self-control will improve Outcome: Progressing   Problem: Health Behavior/Discharge Planning: Goal: Identification of resources available to assist in meeting health care needs will improve Outcome: Progressing Goal: Compliance with treatment plan for underlying cause of condition will improve Outcome: Progressing   Problem: Physical Regulation: Goal: Ability to maintain clinical measurements within normal limits will improve Outcome: Progressing   Problem: Safety: Goal: Periods of time without injury will increase Outcome: Progressing

## 2023-01-26 NOTE — Group Note (Signed)
Recreation Therapy Group Note   Group Topic:Goal Setting  Group Date: 01/26/2023 Start Time: 1000 End Time: 1100 Facilitators: Rosina Lowenstein, LRT, CTRS Location:  Craft Room  Group Description: Scientist, research (physical sciences). Patients were given many different magazines, a glue stick, markers, and a piece of cardstock paper. LRT and pts discussed the importance of having goals in life. LRT and pts discussed the difference between short-term and long-term goals, as well as what a SMART goal is. LRT encouraged pts to create a vision board, with images they picked and then cut out with safety scissors from the magazine, for themselves, that capture their short and long-term goals. LRT encouraged pts to show and explain their vision board to the group.   Goal Area(s) Addressed:  Patient will gain knowledge of short vs. long term goals.  Patient will identify goals for themselves. Patient will practice setting SMART goals. Patient will verbalize their goals to LRT and peers.   Affect/Mood: N/A   Participation Level: Did not attend    Clinical Observations/Individualized Feedback: Nathan Koch did not attend group.  Plan: Continue to engage patient in RT group sessions 2-3x/week.   Rosina Lowenstein, LRT, CTRS 01/26/2023 11:46 AM

## 2023-01-26 NOTE — Progress Notes (Signed)
[facility] MD Progress Note  01/26/2023 5:55 PM Nathan Koch  MRN:  [id number] Subjective:  Patient: 32 year old multiracial male.Concerns about potential relapse due to delayed admission to [facility] patient reports that he has been accepted to [facility] for sober living but is unable to move in at this time due to bed availability. 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 Denials: The patient denies any suicidal ideation, homicidal ideation, self-injurious behavior (SIB), delusions, or hallucinations. Chart and notes reviewed Principal Problem: Other psychoactive substance-induced mood disorder ([name]) Diagnosis: Principal Problem:   Other psychoactive substance-induced mood disorder ([name])  Total Time spent with patient: 45 minutes  Past Psychiatric History: Anxiety PTSD Substance Abuse  Past Medical History:  Past Medical History:  Diagnosis Date   Anxiety    Anxiety disorder    Asthma    Bipolar 1 disorder ([name])    Depression    Methamphetamine abuse ([name])    PTSD (post-traumatic stress disorder)     Past Surgical History:  Procedure Laterality Date   CLOSED REDUCTION MANDIBLE N/A 07/02/2018   Procedure: CLOSED REDUCTION MANDIBULAR;  Surgeon: Vernie Murders, MD;  Location: [facility] ORS;  Service: ENT;  Laterality: N/A;   Family History:  Family History  Problem Relation Age of Onset   Asthma Mother    Cancer Mother        breast cancer   Ulcers Mother    Cancer Father        esophogeal cancer   Hypertension Father    Asthma Brother    Family Psychiatric  History: see above Social History:  Social History   Substance and Sexual Activity  Alcohol Use Not Currently   Alcohol/week: 13.0 standard drinks of alcohol   Types: 6 Cans of beer, 7 Standard drinks or  equivalent per week   Comment: previously heavy drinker 2016. most recent 4 beer/ day drinker and none since 01-11-2020     Social History   Substance and Sexual Activity  Drug Use Yes   Types: Methamphetamines   Comment: states he uses 1 g every other day    Social History   Socioeconomic History   Marital status: Single    Spouse name: Not on file   Number of children: 1   Years of education: 10   Highest education level: 10th grade  Occupational History   Not on file  Tobacco Use   Smoking status: Every Day    Current packs/day: 0.50    Average packs/day: 0.5 packs/day for 8.0 years (4.0 ttl pk-yrs)    Types: Cigarettes   Smokeless tobacco: Never  Vaping Use   Vaping status: Every Day   Substances: Nicotine, Flavoring  Substance and Sexual Activity   Alcohol use: Not Currently    Alcohol/week: 13.0 standard drinks of alcohol    Types: 6 Cans of beer, 7 Standard drinks or equivalent per week    Comment: previously heavy drinker 2016. most recent 4 beer/ day drinker and none since 01-11-2020   Drug use: Yes    Types: Methamphetamines    Comment: states he uses 1 g every other day   Sexual activity: Yes  Other Topics Concern   Not on file  Social History Narrative   Not on file   Social Determinants of Health   Financial Resource  Strain: High Risk (05/05/2022)   Received from [facility], [facility]   Overall Financial Resource Strain (CARDIA)    Difficulty of Paying Living Expenses: Very hard  Food Insecurity: Food Insecurity Present (01/18/2023)   Hunger Vital Sign    Worried About Running Out of Food in the Last Year: Sometimes true    Ran Out of Food in the Last Year: Sometimes true  Transportation Needs: No Transportation Needs (01/18/2023)   PRAPARE - Administrator, Civil Service (Medical): No    Lack of Transportation (Non-Medical): No  Physical Activity: Not on file  Stress: Not on file  Social  Connections: Unknown (09/09/2021)   [facility] from [facility], [facility]   Social Network    Social Network: Not on file   Additional Social History:                         Sleep: Good  Appetite:  Good  Current Medications: Current Facility-Administered Medications  Medication Dose Route Frequency Provider Last Rate Last Admin   acetaminophen (TYLENOL) tablet 650 mg  650 mg Oral Q6H PRN Rankin, Shuvon B, NP   650 mg at 01/25/23 1441   albuterol (VENTOLIN HFA) 108 (90 Base) MCG/ACT inhaler 2 puff  2 puff Inhalation Q4H PRN Sarina Ill, DO   2 puff at 01/24/23 2141   alum & mag hydroxide-simeth (MAALOX/MYLANTA) 200-200-20 MG/5ML suspension 30 mL  30 mL Oral Q4H PRN Rankin, Shuvon B, NP       diphenhydrAMINE (BENADRYL) capsule 50 mg  50 mg Oral TID PRN Rankin, Shuvon B, NP       Or   diphenhydrAMINE (BENADRYL) injection 50 mg  50 mg Intramuscular TID PRN Rankin, Shuvon B, NP       FLUoxetine (PROZAC) capsule 20 mg  20 mg Oral Daily Sarina Ill, DO   20 mg at 01/26/23 4696   hydrocortisone cream 1 %   Topical TID PRN Rankin, Shuvon B, NP       magnesium hydroxide (MILK OF MAGNESIA) suspension 30 mL  30 mL Oral Daily PRN Rankin, Shuvon B, NP       QUEtiapine (SEROQUEL) tablet 150 mg  150 mg Oral QHS Rankin, Shuvon B, NP   150 mg at 01/25/23 2131   risperiDONE (RISPERDAL) tablet 0.5 mg  0.5 mg Oral BH-q8a4p Sarina Ill, DO   0.5 mg at 01/26/23 1635   traZODone (DESYREL) tablet 50 mg  50 mg Oral QHS PRN Rankin, Shuvon B, NP   50 mg at 01/25/23 2131    Lab Results: No results found for this or any previous visit (from the past 48 hour(s)).  Blood Alcohol level:  Lab Results  Component Value Date   ETH <10 01/17/2023   ETH <10 12/29/2021    Metabolic Disorder Labs: Lab Results  Component Value Date   HGBA1C 7.2 (H) 01/17/2023   MPG 159.94 01/17/2023   MPG 126 01/17/2021   Lab Results  Component Value Date   PROLACTIN 10.1  01/17/2023   Lab Results  Component Value Date   CHOL 136 01/17/2023   TRIG 101 01/17/2023   HDL 37 (L) 01/17/2023   CHOLHDL 3.7 01/17/2023   VLDL 20 01/17/2023   LDLCALC 79 01/17/2023   LDLCALC 46 01/17/2021    Physical Findings: AIMS:  , ,  ,  ,    CIWA:    COWS:  COWS Total Score:  0  Musculoskeletal: Strength & Muscle Tone: within normal limits Gait & Station: normal Patient leans: N/A  Psychiatric Specialty Exam:  Presentation  General Appearance:  Appropriate for Environment  Eye Contact: Good  Speech: Clear and Coherent  Speech Volume: Normal  Handedness: Right   Mood and Affect  Mood: Anxious (about discharge)  Affect: Appropriate   Thought Process  Thought Processes: Coherent  Descriptions of Associations:Intact  Orientation:Full (Time, Place and Person) (situation)  Thought Content:WDL  History of Schizophrenia/Schizoaffective disorder:No  Duration of Psychotic Symptoms:N/A  Hallucinations:Hallucinations: None Description of Auditory Hallucinations: none present Description of Visual Hallucinations: none present  Ideas of Reference:None  Suicidal Thoughts:Suicidal Thoughts: No SI Active Intent and/or Plan: -- (none present) SI Passive Intent and/or Plan: -- (none present)  Homicidal Thoughts:Homicidal Thoughts: No   Sensorium  Memory: Immediate Good  Judgment: Good  Insight: Good   Executive Functions  Concentration: Good  Attention Span: Good  Recall: Good  Fund of Knowledge: Good  Language: Good   Psychomotor Activity  Psychomotor Activity:Psychomotor Activity: Normal   Assets  Assets: Resilience; Desire for Improvement; Communication Skills   Sleep  Sleep:Sleep: Good Number of Hours of Sleep: 7    Physical Exam: Physical Exam ROS Blood pressure 117/71, pulse 73, temperature 97.9 F (36.6 C), resp. rate 18, height 5\' 9"  (1.753 m), weight 105.2 kg, SpO2 99%. Body mass index is 34.26  kg/m.   Treatment Plan Summary: 1.Daily contact with patient to assess and evaluate symptoms and progress in treatment 2.Continue to reinforce the use of coping skills discussed in therapy, including strategies to manage cravings and stress while awaiting a bed at [facility]. 3.Encourage participation in support groups and daily activities to help maintain focus on recovery. 4.Work closely with the patient and [facility] to coordinate the transition into sober living as soon as a bed becomes available. 5.Provide additional support during the interim period to prevent relapse, ensuring that the patient has a structured daily routine to minimize triggers. 6.No medication adjustments at this time. 7.The patient denies any current suicidal ideation or relapse behavior but is aware of the risks if discharged prematurely. Reinforce the importance of staying in a supportive environment until the transition to [facility] can occur.  Myriam Forehand, NP 01/26/2023, 5:55 PM

## 2023-01-26 NOTE — Group Note (Signed)
Date:  01/26/2023 Time:  4:48 PM  Group Topic/Focus:  Making Healthy Choices:   The focus of this group is to help patients identify negative/unhealthy choices they were using prior to admission and identify positive/healthier coping strategies to replace them upon discharge.    Participation Level:  Active  Participation Quality:  Appropriate  Affect:  Appropriate  Cognitive:  Alert, Appropriate, and Oriented  Insight: Appropriate  Engagement in Group:  Developing/Improving and Engaged  Modes of Intervention:  Activity, Discussion, Education, Rapport Building, and Socialization  Additional Comments:    Nathan Koch 01/26/2023, 4:48 PM

## 2023-01-26 NOTE — Group Note (Signed)
Date:  01/26/2023 Time:  10:26 AM  Group Topic/Focus:  Healthy Communication:   The focus of this group is to discuss communication, barriers to communication, as well as healthy ways to communicate with others.    Participation Level:  Did Not Attend   [name] 01/26/2023, 10:26 AM

## 2023-01-26 NOTE — [facility] Counselor (Signed)
CSW contacted [facility] to check on referral for pt.   [facility] states he has been approved but he needs to call and schedule his appointment.  CSW will inform pt.   Reynaldo Minium, MSW, [facility] 01/26/2023 10:52 AM

## 2023-01-26 NOTE — Plan of Care (Signed)
Patient rated his depression and anxiety 7/10. Patient stays in bed most of the shift. Appropriate with staff & peers. Patient stated that his goal is to get to [facility]. Patient denies SI,HI and AVH. Appetite and energy level good. Support and encouragement given.

## 2023-01-26 NOTE — Group Note (Signed)
Date:  01/26/2023 Time:  10:04 PM  Group Topic/Focus:  Self Esteem Action Plan:   The focus of this group is to help patients create a plan to continue to build self-esteem after discharge.    Participation Level:  Active  Participation Quality:  Appropriate and Attentive  Affect:  Appropriate  Cognitive:  Alert and Appropriate  Insight: Appropriate and Good  Engagement in Group:  Developing/Improving  Modes of Intervention:  Education, Exploration, Problem-solving, and Reality Testing  Additional Comments:     Sheikh Leverich 01/26/2023, 10:04 PM

## 2023-01-27 MED ORDER — ENSURE ENLIVE PO LIQD
1.0000 | Freq: Two times a day (BID) | ORAL | Status: DC
  Administered 2023-01-27 – 2023-02-02 (×12): 237 mL via ORAL

## 2023-01-27 NOTE — Progress Notes (Signed)
Nursing Shift Note:  1900-0700  Attended Evening Group: yes Medication Compliant:  yes Behavior: cooperative; calm Sleep Quality: good Significant Changes: none noted   01/27/23 0300  Psych Admission Type (Psych Patients Only)  Admission Status Voluntary  Psychosocial Assessment  Patient Complaints None  Eye Contact Fair  Facial Expression Animated  Affect Appropriate to circumstance  Speech Logical/coherent  Interaction Assertive  Motor Activity Slow  Appearance/Hygiene Unremarkable  Behavior Characteristics Cooperative  Mood Pleasant  Thought Process  Coherency WDL  Content Blaming self  Delusions None reported or observed  Perception WDL  Hallucination None reported or observed  Judgment Impaired  Confusion None  Danger to Self  Current suicidal ideation? Denies  Danger to Others  Danger to Others None reported or observed

## 2023-01-27 NOTE — Plan of Care (Signed)
  Problem: Education: Goal: Emotional status will improve Outcome: Progressing   Problem: Education: Goal: Mental status will improve Outcome: Progressing   

## 2023-01-27 NOTE — Progress Notes (Signed)
   01/27/23 0800  Clinical Opiate Withdrawal Scale (COWS)  Resting Pulse Rate 2  Sweating 0  Restlessness 0  Pupil Size 0  Bone or Joint Aches 0  Runny Nose or Tearing 0  GI Upset 0  Tremor 0  Yawning 0  Anxiety or Irritability 0  Gooseflesh Skin 0  COWS Total Score 2

## 2023-01-27 NOTE — Group Note (Signed)
LCSW Group Therapy Note  Group Date: 01/27/2023 Start Time: 1300 End Time: 1415   Type of Therapy and Topic:  Group Therapy: Self-Harm Alternatives  Participation Level:  Active   Description of Group:   Patients participated in a discussion regarding non-suicidal self-injurious behavior (NSSIB, or self-harm) and the stigma surrounding it. There was also discussion surrounding how other maladaptive coping skills could be seen as self-harm, such as substance abuse. Participants were invited to share their experiences with self-harm, with emphasis being placed on the motivation for self-harm (such as release, punishment, feeling numb, etc). Patients were then asked to brainstorm potential substitutions for self-harm and were provided with a handout entitled, "Distraction Techniques and Alternative Coping Strategies," published by [employer] Self-Injury Recovery.  Therapeutic Goals:  Patients will be given the opportunity to discuss NSSIB in a non-judgmental and therapeutic environment. Patients will identify which feelings lead to NSSIB.  Patients will discuss potential healthy coping skills to replace NSSIB Open discussion will specifically address stigma and shame surrounding NSSIB.   Summary of Patient Progress:   Patient was present/active throughout the session and proved open to feedback from CSW and peers. Patient demonstrated fair insight into the subject matter, was respectful of peers, and was present throughout the entire session.  Therapeutic Modalities:   Cognitive Behavioral Therapy   Claudie Fisherman 01/27/2023  2:34 PM

## 2023-01-27 NOTE — Progress Notes (Signed)
D- Patient alert and oriented. Patient presented in a pleasant mood on assessment reporting that he slept good last night and had complaints of back pain. Patient rated his pain a "5/10", but did not request any PRN pain medication from this Clinical research associate. Patient endorsed passive SI, depression, anxiety, and hopelessness on his self-inventory. Patient stated that he is "tired all the time", but his suicidal thoughts are "not as bad" today. Patient denied HI/AVH, at this time. Patient's goal for today is to "work on my thinking", in which he will "think positive", in order to achieve his goal.  A- Scheduled medications administered to patient, per MD orders. Support and encouragement provided. Routine safety checks conducted every 15 minutes. Patient informed to notify staff with problems or concerns.  R- No adverse drug reactions noted. Patient contracts for safety at this time. Patient compliant with medications and treatment plan. Patient receptive, calm, and cooperative. Patient interacts well with others on the unit. Patient remains safe at this time.   01/27/23 0830  Psych Admission Type (Psych Patients Only)  Admission Status Voluntary  Psychosocial Assessment  Patient Complaints Depression;Anxiety;Sadness;Self-harm thoughts  Eye Contact Fair  Facial Expression Sullen  Affect Sullen  Speech Logical/coherent  Interaction Assertive  Motor Activity Slow  Appearance/Hygiene Poor hygiene  Behavior Characteristics Cooperative;Appropriate to situation  Mood Pleasant  Aggressive Behavior  Effect No apparent injury  Thought Process  Coherency Circumstantial  Content Blaming self  Delusions None reported or observed  Perception WDL  Hallucination None reported or observed  Judgment WDL  Confusion None  Danger to Self  Current suicidal ideation? Passive  Self-Injurious Behavior Some self-injurious ideation observed or expressed.  No lethal plan expressed   Agreement Not to Harm Self Yes   Description of Agreement Verbal  Danger to Others  Danger to Others None reported or observed

## 2023-01-27 NOTE — Group Note (Signed)
Date:  01/27/2023 Time:  11:30 PM  Group Topic/Focus:  Goals Group:   The focus of this group is to help patients establish daily goals to achieve during treatment and discuss how the patient can incorporate goal setting into their daily lives to aide in recovery.    Participation Level:  Active  Participation Quality:  Appropriate and Attentive  Affect:  Appropriate  Cognitive:  Alert and Appropriate  Insight: Appropriate, Good, and Improving  Engagement in Group:  Developing/Improving  Modes of Intervention:  Education, Exploration, Problem-solving, and Reality Testing  Additional Comments:     Emmelina Mcloughlin 01/27/2023, 11:30 PM

## 2023-01-27 NOTE — Progress Notes (Signed)
[facility] MD Progress Note  01/27/2023 1205 Nathan Koch  MRN:  [id number] Subjective:  Patient: 32 year old multiracial Koch.Requesting "more liquids" while awaiting [facility] house bed availability.The patient requests "more liquids" during the morning meeting and has agreed to remain inpatient until a bed becomes available at [facility] for sober living. He expresses no complaints about the current treatment plan and remains cooperative while awaiting placement.The patient denies any suicidal ideation, homicidal ideation, self-injurious behavior (SIB), delusions, or hallucinations. He continues to engage appropriately in treatment.Actively participating in treatment and adhering to the current care plan. No behavioral issues reported. Chart and nurse notes reviewed  Principal Problem: Other psychoactive substance-induced mood disorder ([name]) Diagnosis: Principal Problem:   Other psychoactive substance-induced mood disorder ([name])  Total Time spent with patient: 45 minutes  Past Psychiatric History: Anxiety, Bipolar Disorder  Past Medical History:  Past Medical History:  Diagnosis Date   Anxiety    Anxiety disorder    Asthma    Bipolar 1 disorder ([name])    Depression    Methamphetamine abuse ([name])    PTSD (post-traumatic stress disorder)     Past Surgical History:  Procedure Laterality Date   CLOSED REDUCTION MANDIBLE N/A 07/02/2018   Procedure: CLOSED REDUCTION MANDIBULAR;  Surgeon: Vernie Murders, MD;  Location: [facility] ORS;  Service: ENT;  Laterality: N/A;   Family History:  Family History  Problem Relation Age of Onset   Asthma Mother    Cancer Mother        breast cancer   Ulcers Mother    Cancer Father        esophogeal cancer   Hypertension Father    Asthma Brother    Family Psychiatric  History: see above Social History:  Social History   Substance and Sexual Activity  Alcohol Use Not Currently   Alcohol/week: 13.0 standard drinks of alcohol   Types: 6 Cans of beer, 7  Standard drinks or equivalent per week   Comment: previously heavy drinker 2016. most recent 4 beer/ day drinker and none since 01-11-2020     Social History   Substance and Sexual Activity  Drug Use Yes   Types: Methamphetamines   Comment: states he uses 1 g every other day    Social History   Socioeconomic History   Marital status: Single    Spouse name: Not on file   Number of children: 1   Years of education: 10   Highest education level: 10th grade  Occupational History   Not on file  Tobacco Use   Smoking status: Every Day    Current packs/day: 0.50    Average packs/day: 0.5 packs/day for 8.0 years (4.0 ttl pk-yrs)    Types: Cigarettes   Smokeless tobacco: Never  Vaping Use   Vaping status: Every Day   Substances: Nicotine, Flavoring  Substance and Sexual Activity   Alcohol use: Not Currently    Alcohol/week: 13.0 standard drinks of alcohol    Types: 6 Cans of beer, 7 Standard drinks or equivalent per week    Comment: previously heavy drinker 2016. most recent 4 beer/ day drinker and none since 01-11-2020   Drug use: Yes    Types: Methamphetamines    Comment: states he uses 1 g every other day   Sexual activity: Yes  Other Topics Concern   Not on file  Social History Narrative   Not on file   Social Determinants of Health   Financial Resource Strain: High Risk (05/05/2022)   [facility]  [employer]   Overall Financial Resource Strain (CARDIA)    Difficulty of Paying Living Expenses: Very hard  Food Insecurity: Food Insecurity Present (01/18/2023)   Hunger Vital Sign    Worried About Running Out of Food in the Last Year: Sometimes true    Ran Out of Food in the Last Year: Sometimes true  Transportation Needs: No Transportation Needs (01/18/2023)   PRAPARE - Administrator, Civil Service (Medical): No    Lack of Transportation (Non-Medical): No  Physical Activity: Not on file  Stress: Not on file   Social Connections: Unknown (09/09/2021)   Received from [facility]   Social Network    Social Network: Not on file   Additional Social History:                         Sleep: Good  Appetite:  Good  Current Medications: Current Facility-Administered Medications  Medication Dose Route Frequency Provider Last Rate Last Admin   acetaminophen (TYLENOL) tablet 650 mg  650 mg Oral Q6H PRN Rankin, Shuvon B, NP   650 mg at 01/25/23 1441   albuterol (VENTOLIN HFA) 108 (90 Base) MCG/ACT inhaler 2 puff  2 puff Inhalation Q4H PRN Sarina Ill, DO   2 puff at 01/24/23 2141   alum & mag hydroxide-simeth (MAALOX/MYLANTA) 200-200-20 MG/5ML suspension 30 mL  30 mL Oral Q4H PRN Rankin, Shuvon B, NP       diphenhydrAMINE (BENADRYL) capsule 50 mg  50 mg Oral TID PRN Rankin, Shuvon B, NP       Or   diphenhydrAMINE (BENADRYL) injection 50 mg  50 mg Intramuscular TID PRN Rankin, Shuvon B, NP       feeding supplement (ENSURE ENLIVE / ENSURE PLUS) liquid 237 mL  1 Bottle Oral BID BM Myriam Forehand, NP       FLUoxetine (PROZAC) capsule 20 mg  20 mg Oral Daily Sarina Ill, DO   20 mg at 01/27/23 6962   hydrocortisone cream 1 %   Topical TID PRN Rankin, Shuvon B, NP       magnesium hydroxide (MILK OF MAGNESIA) suspension 30 mL  30 mL Oral Daily PRN Rankin, Shuvon B, NP       QUEtiapine (SEROQUEL) tablet 150 mg  150 mg Oral QHS Rankin, Shuvon B, NP   150 mg at 01/26/23 2119   risperiDONE (RISPERDAL) tablet 0.5 mg  0.5 mg Oral [id number] Sarina Ill, DO   0.5 mg at 01/27/23 9528   traZODone (DESYREL) tablet 50 mg  50 mg Oral QHS PRN Rankin, Shuvon B, NP   50 mg at 01/26/23 2119    Lab Results: No results found for this or any previous visit (from the past 48 hour(s)).  Blood Alcohol level:  Lab Results  Component Value Date   ETH <10 01/17/2023   ETH <10 12/29/2021    Metabolic Disorder Labs: Lab Results  Component Value Date   HGBA1C 7.2 (H)  01/17/2023   MPG 159.94 01/17/2023   MPG 126 01/17/2021   Lab Results  Component Value Date   PROLACTIN 10.1 01/17/2023   Lab Results  Component Value Date   CHOL 136 01/17/2023   TRIG 101 01/17/2023   HDL 37 (L) 01/17/2023   CHOLHDL 3.7 01/17/2023   VLDL 20 01/17/2023   LDLCALC 79 01/17/2023   LDLCALC 46 01/17/2021    Musculoskeletal: Strength & Muscle Tone:  within normal limits Gait & Station: normal Patient leans: N/A  Psychiatric Specialty Exam:  Presentation  General Appearance:  Appropriate for Environment  Eye Contact: Good  Speech: Clear and Coherent  Speech Volume: Normal  Handedness: Right   Mood and Affect  Mood: Anxious (about discharge)  Affect: Appropriate   Thought Process  Thought Processes: Coherent  Descriptions of Associations:Intact  Orientation:Full (Time, Place and Person) (situation)  Thought Content:WDL  History of Schizophrenia/Schizoaffective disorder:No  Duration of Psychotic Symptoms:N/A  Hallucinations:Hallucinations: None Description of Auditory Hallucinations: none present Description of Visual Hallucinations: none present  Ideas of Reference:None  Suicidal Thoughts:Suicidal Thoughts: No SI Active Intent and/or Plan: -- (none present) SI Passive Intent and/or Plan: -- (none present)  Homicidal Thoughts:Homicidal Thoughts: No   Sensorium  Memory: Immediate Good  Judgment: Good  Insight: Good   Executive Functions  Concentration: Good  Attention Span: Good  Recall: Good  Fund of Knowledge: Good  Language: Good   Psychomotor Activity  Psychomotor Activity: Psychomotor Activity: Normal   Assets  Assets: Resilience; Desire for Improvement; Communication Skills   Sleep  Sleep: Sleep: Good Number of Hours of Sleep: 7    Physical Exam: Physical Exam ROS Blood pressure 105/76, pulse (!) 108, temperature 98 F (36.7 C), temperature source Oral, resp. rate 20, height 5'  9" (1.753 m), weight 105.2 kg, SpO2 99%. Body mass index is 34.26 kg/m.   Treatment Plan Summary: Daily contact with patient to assess and evaluate symptoms and progress in treatment and Medication management 1.Ensure 1 bottle BID has been ordered to provide additional nutrition and hydration for the patient's comfort. 2.Continue inpatient treatment and monitor the patient's mood and behavior while awaiting bed availability at [facility] for sober living. 3.Continue to monitor for any changes in mood or behavior, although no acute psychiatric concerns are currently noted. 4. Sober Living Placement:Continue working with the discharge team to secure a bed at [facility] as soon as it becomes availabl  Myriam Forehand, NP 01/27/2023, 3:51 PM

## 2023-01-27 NOTE — Group Note (Signed)
Date:  01/27/2023 Time:  7:18 PM  Group Topic/Focus:  OUTDOOR RECREATION STRUCTURED GROUP ACTIVITY    Participation Level:  Active  Participation Quality:  Appropriate  Affect:  Appropriate  Cognitive:  Alert  Insight: Appropriate  Engagement in Group:  Developing/Improving  Modes of Intervention:  Activity  Additional Comments:    Laurell Coalson 01/27/2023, 7:18 PM

## 2023-01-27 NOTE — Progress Notes (Signed)
Pt calm and pleasant during assessment denying SI/HI/AVH. Pt stated he wanted to sleep tonight. Pt compliant with medication administration per MD orders. Pt given education, support, and encouragement to be active in his treatment plan. Pt being monitored Q 15 minutes for safety per unit protocol, remains safe on the unit

## 2023-01-27 NOTE — Plan of Care (Signed)
  Problem: Education: Goal: Knowledge of General Education information will improve Description: Including pain rating scale, medication(s)/side effects and non-pharmacologic comfort measures Outcome: Progressing   Problem: Health Behavior/Discharge Planning: Goal: Ability to manage health-related needs will improve Outcome: Progressing   Problem: Clinical Measurements: Goal: Ability to maintain clinical measurements within normal limits will improve Outcome: Progressing Goal: Will remain free from infection Outcome: Progressing Goal: Diagnostic test results will improve Outcome: Progressing Goal: Respiratory complications will improve Outcome: Progressing Goal: Cardiovascular complication will be avoided Outcome: Progressing   Problem: Activity: Goal: Risk for activity intolerance will decrease Outcome: Progressing   Problem: Nutrition: Goal: Adequate nutrition will be maintained Outcome: Progressing   Problem: Coping: Goal: Level of anxiety will decrease Outcome: Progressing   Problem: Elimination: Goal: Will not experience complications related to bowel motility Outcome: Progressing Goal: Will not experience complications related to urinary retention Outcome: Progressing   Problem: Pain Managment: Goal: General experience of comfort will improve Outcome: Progressing   Problem: Safety: Goal: Ability to remain free from injury will improve Outcome: Progressing   Problem: Skin Integrity: Goal: Risk for impaired skin integrity will decrease Outcome: Progressing   Problem: Education: Goal: Knowledge of [place] [facility] Education information/materials will improve Outcome: Progressing Goal: Emotional status will improve Outcome: Progressing Goal: Mental status will improve Outcome: Progressing Goal: Verbalization of understanding the information provided will improve Outcome: Progressing   Problem: Activity: Goal: Interest or engagement in activities will  improve Outcome: Progressing Goal: Sleeping patterns will improve Outcome: Progressing   Problem: Coping: Goal: Ability to verbalize frustrations and anger appropriately will improve Outcome: Progressing Goal: Ability to demonstrate self-control will improve Outcome: Progressing   Problem: Health Behavior/Discharge Planning: Goal: Identification of resources available to assist in meeting health care needs will improve Outcome: Progressing Goal: Compliance with treatment plan for underlying cause of condition will improve Outcome: Progressing   Problem: Physical Regulation: Goal: Ability to maintain clinical measurements within normal limits will improve Outcome: Progressing   Problem: Safety: Goal: Periods of time without injury will increase Outcome: Progressing   Problem: Education: Goal: Knowledge of [place] [facility] Education information/materials will improve Outcome: Progressing Goal: Emotional status will improve Outcome: Progressing Goal: Mental status will improve Outcome: Progressing Goal: Verbalization of understanding the information provided will improve Outcome: Progressing   Problem: Coping: Goal: Ability to verbalize frustrations and anger appropriately will improve Outcome: Progressing Goal: Ability to demonstrate self-control will improve Outcome: Progressing   Problem: Health Behavior/Discharge Planning: Goal: Identification of resources available to assist in meeting health care needs will improve Outcome: Progressing Goal: Compliance with treatment plan for underlying cause of condition will improve Outcome: Progressing   Problem: Physical Regulation: Goal: Ability to maintain clinical measurements within normal limits will improve Outcome: Progressing   Problem: Safety: Goal: Periods of time without injury will increase Outcome: Progressing

## 2023-01-27 NOTE — Group Note (Signed)
Date:  01/27/2023 Time:  2:25 PM  Group Topic/Focus:  Outdoor recreation structured activity    Participation Level:  Did Not Attend   Nathan Koch 01/27/2023, 2:25 PM

## 2023-01-27 NOTE — Progress Notes (Signed)
   01/27/23 1600  Clinical Opiate Withdrawal Scale (COWS)  Resting Pulse Rate 0  Sweating 0  Restlessness 0  Pupil Size 0  Bone or Joint Aches 0  Runny Nose or Tearing 0  GI Upset 0  Tremor 0  Yawning 0  Anxiety or Irritability 0  Gooseflesh Skin 0  COWS Total Score 0

## 2023-01-27 NOTE — Group Note (Signed)
Date:  01/27/2023 Time:  2:14 PM  Group Topic/Focus:  Recovery Goals:   The focus of this group is to identify appropriate goals for recovery and establish a plan to achieve them.    Participation Level:  Did Not Attend   Nathan Koch 01/27/2023, 2:14 PM

## 2023-01-28 NOTE — Group Note (Signed)
Date:  01/28/2023 Time:  8:47 PM  Group Topic/Focus:  Stages of Change:   The focus of this group is to explain the stages of change and help patients identify changes they want to make upon discharge.    Participation Level:  Active  Participation Quality:  Appropriate and Attentive  Affect:  Appropriate  Cognitive:  Alert and Appropriate  Insight: Appropriate, Good, and Improving  Engagement in Group:  Developing/Improving and Engaged  Modes of Intervention:  Clarification, Discussion, Education, Problem-solving, Rapport Building, Socialization, and Support  Additional Comments:     Jovon Winterhalter 01/28/2023, 8:47 PM

## 2023-01-28 NOTE — Plan of Care (Signed)
  Problem: Education: Goal: Knowledge of General Education information will improve Description: Including pain rating scale, medication(s)/side effects and non-pharmacologic comfort measures Outcome: Progressing   Problem: Health Behavior/Discharge Planning: Goal: Ability to manage health-related needs will improve Outcome: Progressing   Problem: Clinical Measurements: Goal: Ability to maintain clinical measurements within normal limits will improve Outcome: Progressing Goal: Will remain free from infection Outcome: Progressing Goal: Diagnostic test results will improve Outcome: Progressing Goal: Respiratory complications will improve Outcome: Progressing Goal: Cardiovascular complication will be avoided Outcome: Progressing   Problem: Activity: Goal: Risk for activity intolerance will decrease Outcome: Progressing   Problem: Nutrition: Goal: Adequate nutrition will be maintained Outcome: Progressing   Problem: Coping: Goal: Level of anxiety will decrease Outcome: Progressing   Problem: Elimination: Goal: Will not experience complications related to bowel motility Outcome: Progressing Goal: Will not experience complications related to urinary retention Outcome: Progressing   Problem: Pain Managment: Goal: General experience of comfort will improve Outcome: Progressing   Problem: Safety: Goal: Ability to remain free from injury will improve Outcome: Progressing   Problem: Skin Integrity: Goal: Risk for impaired skin integrity will decrease Outcome: Progressing   Problem: Education: Goal: Knowledge of [place] [facility] Education information/materials will improve Outcome: Progressing Goal: Emotional status will improve Outcome: Progressing Goal: Mental status will improve Outcome: Progressing Goal: Verbalization of understanding the information provided will improve Outcome: Progressing   Problem: Activity: Goal: Interest or engagement in activities will  improve Outcome: Progressing Goal: Sleeping patterns will improve Outcome: Progressing   Problem: Coping: Goal: Ability to verbalize frustrations and anger appropriately will improve Outcome: Progressing Goal: Ability to demonstrate self-control will improve Outcome: Progressing   Problem: Health Behavior/Discharge Planning: Goal: Identification of resources available to assist in meeting health care needs will improve Outcome: Progressing Goal: Compliance with treatment plan for underlying cause of condition will improve Outcome: Progressing   Problem: Physical Regulation: Goal: Ability to maintain clinical measurements within normal limits will improve Outcome: Progressing   Problem: Safety: Goal: Periods of time without injury will increase Outcome: Progressing   Problem: Education: Goal: Knowledge of [place] [facility] Education information/materials will improve Outcome: Progressing Goal: Emotional status will improve Outcome: Progressing Goal: Mental status will improve Outcome: Progressing Goal: Verbalization of understanding the information provided will improve Outcome: Progressing   Problem: Coping: Goal: Ability to verbalize frustrations and anger appropriately will improve Outcome: Progressing Goal: Ability to demonstrate self-control will improve Outcome: Progressing   Problem: Health Behavior/Discharge Planning: Goal: Identification of resources available to assist in meeting health care needs will improve Outcome: Progressing Goal: Compliance with treatment plan for underlying cause of condition will improve Outcome: Progressing   Problem: Physical Regulation: Goal: Ability to maintain clinical measurements within normal limits will improve Outcome: Progressing   Problem: Safety: Goal: Periods of time without injury will increase Outcome: Progressing

## 2023-01-28 NOTE — Plan of Care (Signed)
Pt compliant with medications and procedures on the unit  Problem: Education: Goal: Knowledge of General Education information will improve Description: Including pain rating scale, medication(s)/side effects and non-pharmacologic comfort measures Outcome: Progressing   Problem: Health Behavior/Discharge Planning: Goal: Ability to manage health-related needs will improve Outcome: Progressing   Problem: Clinical Measurements: Goal: Ability to maintain clinical measurements within normal limits will improve Outcome: Progressing Goal: Will remain free from infection Outcome: Progressing Goal: Diagnostic test results will improve Outcome: Progressing Goal: Respiratory complications will improve Outcome: Progressing Goal: Cardiovascular complication will be avoided Outcome: Progressing   Problem: Activity: Goal: Risk for activity intolerance will decrease Outcome: Progressing   Problem: Nutrition: Goal: Adequate nutrition will be maintained Outcome: Progressing   Problem: Coping: Goal: Level of anxiety will decrease Outcome: Progressing   Problem: Elimination: Goal: Will not experience complications related to bowel motility Outcome: Progressing Goal: Will not experience complications related to urinary retention Outcome: Progressing   Problem: Pain Managment: Goal: General experience of comfort will improve Outcome: Progressing   Problem: Safety: Goal: Ability to remain free from injury will improve Outcome: Progressing   Problem: Skin Integrity: Goal: Risk for impaired skin integrity will decrease Outcome: Progressing   Problem: Education: Goal: Knowledge of [place] [facility] Education information/materials will improve Outcome: Progressing Goal: Emotional status will improve Outcome: Progressing Goal: Mental status will improve Outcome: Progressing Goal: Verbalization of understanding the information provided will improve Outcome: Progressing   Problem:  Activity: Goal: Interest or engagement in activities will improve Outcome: Progressing Goal: Sleeping patterns will improve Outcome: Progressing   Problem: Coping: Goal: Ability to verbalize frustrations and anger appropriately will improve Outcome: Progressing Goal: Ability to demonstrate self-control will improve Outcome: Progressing   Problem: Health Behavior/Discharge Planning: Goal: Identification of resources available to assist in meeting health care needs will improve Outcome: Progressing Goal: Compliance with treatment plan for underlying cause of condition will improve Outcome: Progressing   Problem: Physical Regulation: Goal: Ability to maintain clinical measurements within normal limits will improve Outcome: Progressing   Problem: Safety: Goal: Periods of time without injury will increase Outcome: Progressing   Problem: Education: Goal: Knowledge of [place] General Education information/materials will improve Outcome: Progressing Goal: Emotional status will improve Outcome: Progressing Goal: Mental status will improve Outcome: Progressing Goal: Verbalization of understanding the information provided will improve Outcome: Progressing   Problem: Coping: Goal: Ability to verbalize frustrations and anger appropriately will improve Outcome: Progressing Goal: Ability to demonstrate self-control will improve Outcome: Progressing   Problem: Health Behavior/Discharge Planning: Goal: Identification of resources available to assist in meeting health care needs will improve Outcome: Progressing Goal: Compliance with treatment plan for underlying cause of condition will improve Outcome: Progressing   Problem: Physical Regulation: Goal: Ability to maintain clinical measurements within normal limits will improve Outcome: Progressing   Problem: Safety: Goal: Periods of time without injury will increase Outcome: Progressing

## 2023-01-28 NOTE — Progress Notes (Signed)
D- Patient alert and oriented. Patient presented in a pleasant mood on assessment reporting that he slept good last night and had complaints of kidney pain. Patient rated his pain a "7/10", in which he requested PRN medication to help with relief. Patient endorsed hopelessness, depression, and anxiety on his self-inventory, but did not go into detail as to why he's feeling this way. Patient denies SI, HI, AVH at this time, stating that he is "doing alright" overall. Patient's goal for today is to "get to treatment", in which he will do "everything I need to do", in order to achieve his goal.  A- Scheduled medications administered to patient, per MD orders. Support and encouragement provided.  Routine safety checks conducted every 15 minutes.  Patient informed to notify staff with problems or concerns.  R- No adverse drug reactions noted. Patient contracts for safety at this time. Patient compliant with medications and treatment plan. Patient receptive, calm, and cooperative. Patient interacts well with others on the unit.  Patient remains safe at this time.   01/28/23 0830  Psych Admission Type (Psych Patients Only)  Admission Status Voluntary  Psychosocial Assessment  Patient Complaints Anxiety;Depression;Hopelessness  Eye Contact Fair  Facial Expression Sad  Affect Sullen  Speech Logical/coherent  Interaction Assertive  Motor Activity Slow  Appearance/Hygiene Poor hygiene;Disheveled  Behavior Characteristics Cooperative;Appropriate to situation  Mood Pleasant  Aggressive Behavior  Effect No apparent injury  Thought Process  Coherency Circumstantial  Content WDL  Delusions None reported or observed  Perception WDL  Hallucination None reported or observed  Judgment WDL  Confusion None  Danger to Self  Current suicidal ideation? Denies  Self-Injurious Behavior No self-injurious ideation or behavior indicators observed or expressed   Agreement Not to Harm Self Yes  Description of  Agreement Verbal  Danger to Others  Danger to Others None reported or observed

## 2023-01-28 NOTE — Group Note (Signed)
Date:  01/28/2023 Time:  2:28 PM  Group Topic/Focus:  Goals Group:   The focus of this group is to help patients establish daily goals to achieve during treatment and discuss how the patient can incorporate goal setting into their daily lives to aide in recovery.    Participation Level:  Active  Participation Quality:  Appropriate  Affect:  Appropriate  Cognitive:  Appropriate  Insight: Good  Engagement in Group:  Engaged  Modes of Intervention:  Clarification and Education  Additional Comments:    Ardelle Anton 01/28/2023, 2:28 PM

## 2023-01-28 NOTE — Group Note (Signed)
Recreation Therapy Group Note   Group Topic:Communication  Group Date: 01/28/2023 Start Time: 1000 End Time: 1100 Facilitators: Rosina Lowenstein, LRT, CTRS Location: Courtyard  Group Description: Emotional Check in. Patient sat and talked with LRT about how they are doing and whatever else is on their mind. LRT provided active listening, reassurance and encouragement. Pts were given the opportunity to listen to music or play cornhole while getting fresh air and sunlight in the courtyard.    Goal Area(s) Addressed: Patient will engage in conversation with LRT. Patient will communicate their wants, needs, or questions.  Patient will practice a new coping skill of "talking to someone".   Affect/Mood: N/A   Participation Level: Did not attend    Clinical Observations/Individualized Feedback: Nathan Koch did not attend group.  Plan: Continue to engage patient in RT group sessions 2-3x/week.   Rosina Lowenstein, LRT, CTRS 01/28/2023 11:49 AM

## 2023-01-28 NOTE — Progress Notes (Signed)
[facility] MD Progress Note  01/28/2023 2:21 PM Nathan Koch  MRN:  [id number] Subjective:  32yo multiracial The patient reports that he is awaiting transfer to inpatient rehab.Social work is following up on this. The patient states that he is tolerating his medications well and has no complaints. He denies any suicidal ideation (SI) or Homicidal (HI)The patient has been compliant with his medication His mood appears stable, and there are no acute safety concerns.regimen, with no observed side effects. Principal Problem: Other psychoactive substance-induced mood disorder ([name]) Diagnosis: Principal Problem:   Other psychoactive substance-induced mood disorder ([name])  Total Time spent with patient: 45 minutes  Past Psychiatric History: Anxiety  Past Medical History:  Past Medical History:  Diagnosis Date   Anxiety    Anxiety disorder    Asthma    Bipolar 1 disorder ([name])    Depression    Methamphetamine abuse ([name])    PTSD (post-traumatic stress disorder)     Past Surgical History:  Procedure Laterality Date   CLOSED REDUCTION MANDIBLE N/A 07/02/2018   Procedure: CLOSED REDUCTION MANDIBULAR;  Surgeon: Vernie Murders, MD;  Location: [facility] ORS;  Service: ENT;  Laterality: N/A;   Family History:  Family History  Problem Relation Age of Onset   Asthma Mother    Cancer Mother        breast cancer   Ulcers Mother    Cancer Father        esophogeal cancer   Hypertension Father    Asthma Brother    Family Psychiatric  History: see above Social History:  Social History   Substance and Sexual Activity  Alcohol Use Not Currently   Alcohol/week: 13.0 standard drinks of alcohol   Types: 6 Cans of beer, 7 Standard drinks or equivalent per week   Comment: previously heavy drinker 2016. most recent 4 beer/ day drinker and none since 01-11-2020     Social History   Substance and Sexual Activity  Drug Use Yes   Types: Methamphetamines   Comment: states he uses 1 g every other day     Social History   Socioeconomic History   Marital status: Single    Spouse name: Not on file   Number of children: 1   Years of education: 10   Highest education level: 10th grade  Occupational History   Not on file  Tobacco Use   Smoking status: Every Day    Current packs/day: 0.50    Average packs/day: 0.5 packs/day for 8.0 years (4.0 ttl pk-yrs)    Types: Cigarettes   Smokeless tobacco: Never  Vaping Use   Vaping status: Every Day   Substances: Nicotine, Flavoring  Substance and Sexual Activity   Alcohol use: Not Currently    Alcohol/week: 13.0 standard drinks of alcohol    Types: 6 Cans of beer, 7 Standard drinks or equivalent per week    Comment: previously heavy drinker 2016. most recent 4 beer/ day drinker and none since 01-11-2020   Drug use: Yes    Types: Methamphetamines    Comment: states he uses 1 g every other day   Sexual activity: Yes  Other Topics Concern   Not on file  Social History Narrative   Not on file   Social Determinants of Health   Financial Resource Strain: High Risk (05/05/2022)   Received from [facility], [facility]   Overall Financial Resource Strain (CARDIA)    Difficulty of Paying Living Expenses: Very hard  Food Insecurity: Food  Insecurity Present (01/18/2023)   Hunger Vital Sign    Worried About Running Out of Food in the Last Year: Sometimes true    Ran Out of Food in the Last Year: Sometimes true  Transportation Needs: No Transportation Needs (01/18/2023)   PRAPARE - Administrator, Civil Service (Medical): No    Lack of Transportation (Non-Medical): No  Physical Activity: Not on file  Stress: Not on file  Social Connections: Unknown (09/09/2021)   Received from [facility], [facility]   Social Network    Social Network: Not on file   Additional Social History:                         Sleep: Negative  Appetite:  Negative  Current Medications: Current  Facility-Administered Medications  Medication Dose Route Frequency Provider Last Rate Last Admin   acetaminophen (TYLENOL) tablet 650 mg  650 mg Oral Q6H PRN Rankin, Shuvon B, NP   650 mg at 01/28/23 0831   albuterol (VENTOLIN HFA) 108 (90 Base) MCG/ACT inhaler 2 puff  2 puff Inhalation Q4H PRN Sarina Ill, DO   2 puff at 01/24/23 2141   alum & mag hydroxide-simeth (MAALOX/MYLANTA) 200-200-20 MG/5ML suspension 30 mL  30 mL Oral Q4H PRN Rankin, Shuvon B, NP       diphenhydrAMINE (BENADRYL) capsule 50 mg  50 mg Oral TID PRN Rankin, Shuvon B, NP       Or   diphenhydrAMINE (BENADRYL) injection 50 mg  50 mg Intramuscular TID PRN Rankin, Shuvon B, NP       feeding supplement (ENSURE ENLIVE / ENSURE PLUS) liquid 237 mL  1 Bottle Oral BID BM Myriam Forehand, NP   237 mL at 01/28/23 1436   FLUoxetine (PROZAC) capsule 20 mg  20 mg Oral Daily Sarina Ill, DO   20 mg at 01/28/23 9811   hydrocortisone cream 1 %   Topical TID PRN Rankin, Shuvon B, NP   Given at 01/28/23 0833   magnesium hydroxide (MILK OF MAGNESIA) suspension 30 mL  30 mL Oral Daily PRN Rankin, Shuvon B, NP       QUEtiapine (SEROQUEL) tablet 150 mg  150 mg Oral QHS Rankin, Shuvon B, NP   150 mg at 01/27/23 2104   risperiDONE (RISPERDAL) tablet 0.5 mg  0.5 mg Oral BH-q8a4p Sarina Ill, DO   0.5 mg at 01/28/23 1612   traZODone (DESYREL) tablet 50 mg  50 mg Oral QHS PRN Rankin, Shuvon B, NP   50 mg at 01/27/23 2103    Lab Results: No results found for this or any previous visit (from the past 48 hour(s)).  Blood Alcohol level:  Lab Results  Component Value Date   ETH <10 01/17/2023   ETH <10 12/29/2021    Metabolic Disorder Labs: Lab Results  Component Value Date   HGBA1C 7.2 (H) 01/17/2023   MPG 159.94 01/17/2023   MPG 126 01/17/2021   Lab Results  Component Value Date   PROLACTIN 10.1 01/17/2023   Lab Results  Component Value Date   CHOL 136 01/17/2023   TRIG 101 01/17/2023   HDL 37 (L)  01/17/2023   CHOLHDL 3.7 01/17/2023   VLDL 20 01/17/2023   LDLCALC 79 01/17/2023   LDLCALC 46 01/17/2021    Physical Findings: AIMS:  , ,  ,  ,    CIWA:    COWS:  COWS Total Score: 0  Musculoskeletal: Strength & Muscle  Tone: within normal limits Gait & Station: normal Patient leans: N/A  Psychiatric Specialty Exam:  Presentation  General Appearance:  Appropriate for Environment; Fairly Groomed  Eye Contact: Good  Speech: Clear and Coherent  Speech Volume: Normal  Handedness: Right   Mood and Affect  Mood: Anxious  Affect: Appropriate   Thought Process  Thought Processes: Coherent  Descriptions of Associations:Intact  Orientation:Full (Time, Place and Person)  Thought Content:WDL  History of Schizophrenia/Schizoaffective disorder:No  Duration of Psychotic Symptoms:N/A  Hallucinations:Hallucinations: None Description of Auditory Hallucinations: none Description of Visual Hallucinations: none  Ideas of Reference:None  Suicidal Thoughts:Suicidal Thoughts: No SI Active Intent and/or Plan: -- (nonr) SI Passive Intent and/or Plan: -- (none noted)  Homicidal Thoughts:Homicidal Thoughts: No   Sensorium  Memory: Immediate Good  Judgment: Good  Insight: Good   Executive Functions  Concentration: Good  Attention Span: Good  Recall: Good  Fund of Knowledge: Good  Language: Good   Psychomotor Activity  Psychomotor Activity: Psychomotor Activity: Normal   Assets  Assets: Communication Skills; Desire for Improvement; Resilience; Social Support   Sleep  Sleep: Sleep: Good Number of Hours of Sleep: 8    Physical Exam: Physical Exam Vitals and nursing note reviewed.  Constitutional:      Appearance: Normal appearance.  HENT:     Head: Normocephalic and atraumatic.     Nose: Nose normal.  Pulmonary:     Effort: Pulmonary effort is normal.  Musculoskeletal:        General: Normal range of motion.  Neurological:      General: No focal deficit present.     Mental Status: He is alert and oriented to person, place, and time.  Psychiatric:        Mood and Affect: Mood normal.        Thought Content: Thought content normal.        Judgment: Judgment normal.    Review of Systems  All other systems reviewed and are negative.  Blood pressure 132/82, pulse 67, temperature 97.6 F (36.4 C), temperature source Oral, resp. rate (!) 22, height 5\' 9"  (1.753 m), weight 105.2 kg, SpO2 99%. Body mass index is 34.26 kg/m.   Treatment Plan Summary: No medication changes at this time. Social work will continue to follow up provide support as needed. Continue monitoring the patient's mental health and medication compliance.   Myriam Forehand, NP 01/28/2023, 6:21 PM

## 2023-01-28 NOTE — Progress Notes (Signed)
Patient voiced no complaints, nor any withdrawal symptoms to this Clinical research associate.   01/28/23 1600  Clinical Opiate Withdrawal Scale (COWS)  Resting Pulse Rate 0  Sweating 0  Restlessness 0  Pupil Size 0  Bone or Joint Aches 0  Runny Nose or Tearing 0  GI Upset 0  Tremor 0  Yawning 0  Anxiety or Irritability 0  Gooseflesh Skin 0  COWS Total Score 0

## 2023-01-28 NOTE — Progress Notes (Signed)
Patient had no complaints of any withdrawal signs/symptoms to voice to this writer at this time.   01/28/23 0800  Clinical Opiate Withdrawal Scale (COWS)  Resting Pulse Rate 0  Sweating 0  Restlessness 0  Pupil Size 0  Bone or Joint Aches 0  Runny Nose or Tearing 0  GI Upset 0  Tremor 0  Yawning 0  Anxiety or Irritability 0  Gooseflesh Skin 0  COWS Total Score 0

## 2023-01-29 NOTE — Progress Notes (Signed)
[facility] MD Progress Note  01/29/2023 1206 Nathan Koch  MRN:  [id number] Subjective:  The patient is a 32 year old multiracial male who reports that he is awaiting transfer to inpatient rehabilitation, with social work currently following up on the process. He states that he is tolerating his medications well and has no complaints. The patient denies any suicidal ideation (SI) or homicidal ideation (HI).Patient denies any symptoms of psychosis or delusional thoughts. He is compliant with his medications, and no side effects have been observed or reportedThe patient remains stable and compliant with his medication regimen. There are no acute safety concerns, and his mood appears well-regulated. He is awaiting transfer to inpatient rehab, and social work is managing the follow-up. The patient's current condition is stable, and he demonstrates positive engagement with treatment. Principal Problem: Other psychoactive substance-induced mood disorder ([name]) Diagnosis: Principal Problem:   Other psychoactive substance-induced mood disorder ([name])  Total Time spent with patient: 45 minutes  Past Psychiatric History: Anxiety Bipolar Polysubstance  Past Medical History:  Past Medical History:  Diagnosis Date   Anxiety    Anxiety disorder    Asthma    Bipolar 1 disorder ([name])    Depression    Methamphetamine abuse ([name])    PTSD (post-traumatic stress disorder)     Past Surgical History:  Procedure Laterality Date   CLOSED REDUCTION MANDIBLE N/A 07/02/2018   Procedure: CLOSED REDUCTION MANDIBULAR;  Surgeon: Vernie Murders, MD;  Location: [facility] ORS;  Service: ENT;  Laterality: N/A;   Family History:  Family History  Problem Relation Age of Onset   Asthma Mother    Cancer Mother        breast cancer   Ulcers Mother    Cancer Father        esophogeal cancer   Hypertension Father    Asthma Brother    Family Psychiatric  History: see above Social History:  Social History   Substance and Sexual  Activity  Alcohol Use Not Currently   Alcohol/week: 13.0 standard drinks of alcohol   Types: 6 Cans of beer, 7 Standard drinks or equivalent per week   Comment: previously heavy drinker 2016. most recent 4 beer/ day drinker and none since 01-11-2020     Social History   Substance and Sexual Activity  Drug Use Yes   Types: Methamphetamines   Comment: states he uses 1 g every other day    Social History   Socioeconomic History   Marital status: Single    Spouse name: Not on file   Number of children: 1   Years of education: 10   Highest education level: 10th grade  Occupational History   Not on file  Tobacco Use   Smoking status: Every Day    Current packs/day: 0.50    Average packs/day: 0.5 packs/day for 8.0 years (4.0 ttl pk-yrs)    Types: Cigarettes   Smokeless tobacco: Never  Vaping Use   Vaping status: Every Day   Substances: Nicotine, Flavoring  Substance and Sexual Activity   Alcohol use: Not Currently    Alcohol/week: 13.0 standard drinks of alcohol    Types: 6 Cans of beer, 7 Standard drinks or equivalent per week    Comment: previously heavy drinker 2016. most recent 4 beer/ day drinker and none since 01-11-2020   Drug use: Yes    Types: Methamphetamines    Comment: states he uses 1 g every other day   Sexual activity: Yes  Other Topics Concern   Not on file  Social History Narrative   Not on file   Social Determinants of Health   Financial Resource Strain: High Risk (05/05/2022)   Received from [facility], [facility]   Overall Financial Resource Strain (CARDIA)    Difficulty of Paying Living Expenses: Very hard  Food Insecurity: Food Insecurity Present (01/18/2023)   Hunger Vital Sign    Worried About Running Out of Food in the Last Year: Sometimes true    Ran Out of Food in the Last Year: Sometimes true  Transportation Needs: No Transportation Needs (01/18/2023)   PRAPARE - Administrator, Civil Service  (Medical): No    Lack of Transportation (Non-Medical): No  Physical Activity: Not on file  Stress: Not on file  Social Connections: Unknown (09/09/2021)   Received from [facility], [facility]   Social Network    Social Network: Not on file   Additional Social History:                         Sleep: Negative  Appetite:  Negative  Current Medications: Current Facility-Administered Medications  Medication Dose Route Frequency Provider Last Rate Last Admin   acetaminophen (TYLENOL) tablet 650 mg  650 mg Oral Q6H PRN Rankin, Shuvon B, NP   650 mg at 01/29/23 1622   albuterol (VENTOLIN HFA) 108 (90 Base) MCG/ACT inhaler 2 puff  2 puff Inhalation Q4H PRN Sarina Ill, DO   2 puff at 01/28/23 2309   alum & mag hydroxide-simeth (MAALOX/MYLANTA) 200-200-20 MG/5ML suspension 30 mL  30 mL Oral Q4H PRN Rankin, Shuvon B, NP       diphenhydrAMINE (BENADRYL) capsule 50 mg  50 mg Oral TID PRN Rankin, Shuvon B, NP       Or   diphenhydrAMINE (BENADRYL) injection 50 mg  50 mg Intramuscular TID PRN Rankin, Shuvon B, NP       feeding supplement (ENSURE ENLIVE / ENSURE PLUS) liquid 237 mL  1 Bottle Oral BID BM Myriam Forehand, NP   237 mL at 01/29/23 1301   FLUoxetine (PROZAC) capsule 20 mg  20 mg Oral Daily Sarina Ill, DO   20 mg at 01/29/23 0817   hydrocortisone cream 1 %   Topical TID PRN Rankin, Shuvon B, NP   Given at 01/28/23 0833   magnesium hydroxide (MILK OF MAGNESIA) suspension 30 mL  30 mL Oral Daily PRN Rankin, Shuvon B, NP       QUEtiapine (SEROQUEL) tablet 150 mg  150 mg Oral QHS Rankin, Shuvon B, NP   150 mg at 01/28/23 2114   risperiDONE (RISPERDAL) tablet 0.5 mg  0.5 mg Oral BH-q8a4p Sarina Ill, DO   0.5 mg at 01/29/23 1621   traZODone (DESYREL) tablet 50 mg  50 mg Oral QHS PRN Rankin, Shuvon B, NP   50 mg at 01/28/23 2114    Lab Results: No results found for this or any previous visit (from the past 48 hour(s)).  Blood Alcohol level:   Lab Results  Component Value Date   [facility] <10 01/17/2023   ETH <10 12/29/2021    Metabolic Disorder Labs: Lab Results  Component Value Date   HGBA1C 7.2 (H) 01/17/2023   MPG 159.94 01/17/2023   MPG 126 01/17/2021   Lab Results  Component Value Date   PROLACTIN 10.1 01/17/2023   Lab Results  Component Value Date   CHOL 136 01/17/2023   TRIG 101 01/17/2023  HDL 37 (L) 01/17/2023   CHOLHDL 3.7 01/17/2023   VLDL 20 01/17/2023   LDLCALC 79 01/17/2023   LDLCALC 46 01/17/2021    Physical Findings: AIMS:  , ,  ,  ,    CIWA:  CIWA-Ar Total: 0 COWS:  COWS Total Score: 0  Musculoskeletal: Strength & Muscle Tone: within normal limits Gait & Station: normal Patient leans: N/A  Psychiatric Specialty Exam:  Presentation  General Appearance:  Appropriate for Environment; Fairly Groomed  Eye Contact: Good  Speech: Clear and Coherent  Speech Volume: Normal  Handedness: Right   Mood and Affect  Mood: Anxious (about discharged)  Affect: Appropriate   Thought Process  Thought Processes: Coherent  Descriptions of Associations:Intact  Orientation:Full (Time, Place and Person) (and situation)  Thought Content:WDL  History of Schizophrenia/Schizoaffective disorder:No  Duration of Psychotic Symptoms:N/A  Hallucinations:Hallucinations: None Description of Auditory Hallucinations: none reported Description of Visual Hallucinations: none reported  Ideas of Reference:None  Suicidal Thoughts:Suicidal Thoughts: No SI Active Intent and/or Plan: -- (none noted) SI Passive Intent and/or Plan: -- (none noted)  Homicidal Thoughts:Homicidal Thoughts: No   Sensorium  Memory: Immediate Good; Remote Good  Judgment: Good  Insight: Good   Executive Functions  Concentration: Good  Attention Span: Good  Recall: Good  Fund of Knowledge: Good  Language: Good   Psychomotor Activity  Psychomotor Activity: Psychomotor Activity:  Normal   Assets  Assets: Communication Skills; Desire for Improvement; Social Support   Sleep  Sleep: Sleep: Good Number of Hours of Sleep: 8    Physical Exam: Physical Exam Vitals and nursing note reviewed.  Constitutional:      Appearance: Normal appearance.  HENT:     Head: Normocephalic and atraumatic.     Nose: Nose normal.  Pulmonary:     Effort: Pulmonary effort is normal.  Musculoskeletal:        General: Normal range of motion.     Cervical back: Normal range of motion.  Neurological:     General: No focal deficit present.     Mental Status: He is alert and oriented to person, place, and time.  Psychiatric:        Mood and Affect: Mood normal.        Behavior: Behavior normal.        Thought Content: Thought content normal.        Judgment: Judgment normal.    Review of Systems  All other systems reviewed and are negative.  Blood pressure 118/64, pulse 65, temperature (!) 97.2 F (36.2 C), resp. rate 16, height 5\' 9"  (1.753 m), weight 105.2 kg, SpO2 100%. Body mass index is 34.26 kg/m.   Treatment Plan Summary: 1.Continue the current medication regimen as the patient is tolerating it well without any side effects. Monitor for any changes in response to medications, especially during the transition to inpatient rehab. 2.Social work is following up on the transfer to inpatient rehabilitation. 3.Ensure communication and coordination between the current treatment team and the rehab facility to ensure continuity of care. 4.Continue monitoring the patient's mood and overall well-being while awaiting transfer. 5.Provide ongoing support and encourage the patient to engage in therapeutic activities during this interim period.  Myriam Forehand, NP 01/29/2023, 8:06 PM

## 2023-01-29 NOTE — Group Note (Signed)
Date:  01/29/2023 Time:  9:16 PM  Group Topic/Focus:  Wrap-Up Group:   The focus of this group is to help patients review their daily goal of treatment and discuss progress on daily workbooks.    Participation Level:  Active  Participation Quality:  Appropriate  Affect:  Appropriate  Cognitive:  Appropriate  Insight: Appropriate  Engagement in Group:  Engaged  Modes of Intervention:  Discussion  Lenore Cordia 01/29/2023, 9:16 PM

## 2023-01-29 NOTE — Progress Notes (Signed)
Patient admitted voluntarily from [facility] on Sept 232, 2024 for SI with plan to walk into traffic. Reports of depression and PTSD, and substance abuse.   Patient denies SI/HI/AVH. He denies anxiety and depression. Patient complains of 7/10 back and L hip pain. Tylenol adm at 1622. Upon follow up, pain decreased to a 4. He attended group but did not attend rec therapy. Encouragement provided.  Q15 minute unit checks in place.

## 2023-01-29 NOTE — Group Note (Signed)
Recreation Therapy Group Note   Group Topic:Leisure Education  Group Date: 01/29/2023 Start Time: 1000 End Time: 1100 Facilitators: Rosina Lowenstein, LRT, CTRS Location:  Craft Room  Group Description: Leisure. Patients were given the option to choose from singing karaoke, coloring mandalas, using oil pastels, journaling, or playing with play-doh. LRT and pts discussed the meaning of leisure, the importance of participating in leisure during their free time/when they're outside of the hospital, as well as how our leisure interests can also serve as coping skills.   Goal Area(s) Addressed:  Patient will identify a current leisure interest.  Patient will learn the definition of "leisure". Patient will practice making a positive decision. Patient will have the opportunity to try a new leisure activity. Patient will communicate with peers and LRT.    Affect/Mood: N/A   Participation Level: Did not attend    Clinical Observations/Individualized Feedback: Niel did not attend group.  Plan: Continue to engage patient in RT group sessions 2-3x/week.   Rosina Lowenstein, LRT, CTRS 01/29/2023 11:31 AM

## 2023-01-29 NOTE — Plan of Care (Signed)
Problem: Education: Goal: Knowledge of General Education information will improve Description: Including pain rating scale, medication(s)/side effects and non-pharmacologic comfort measures 01/29/2023 0702 by Elbert Ewings, RN Outcome: Progressing 01/29/2023 0702 by Elbert Ewings, RN Outcome: Progressing 01/29/2023 0701 by Elbert Ewings, RN Outcome: Progressing   Problem: Health Behavior/Discharge Planning: Goal: Ability to manage health-related needs will improve 01/29/2023 0702 by Elbert Ewings, RN Outcome: Progressing 01/29/2023 0702 by Elbert Ewings, RN Outcome: Progressing 01/29/2023 0701 by Elbert Ewings, RN Outcome: Progressing   Problem: Clinical Measurements: Goal: Ability to maintain clinical measurements within normal limits will improve 01/29/2023 0702 by Elbert Ewings, RN Outcome: Progressing 01/29/2023 0702 by Elbert Ewings, RN Outcome: Progressing 01/29/2023 0701 by Elbert Ewings, RN Outcome: Progressing Goal: Will remain free from infection 01/29/2023 0702 by Elbert Ewings, RN Outcome: Progressing 01/29/2023 0702 by Elbert Ewings, RN Outcome: Progressing 01/29/2023 0701 by Elbert Ewings, RN Outcome: Progressing Goal: Diagnostic test results will improve 01/29/2023 0702 by Elbert Ewings, RN Outcome: Progressing 01/29/2023 0702 by Elbert Ewings, RN Outcome: Progressing 01/29/2023 0701 by Elbert Ewings, RN Outcome: Progressing Goal: Respiratory complications will improve 01/29/2023 0702 by Elbert Ewings, RN Outcome: Progressing 01/29/2023 0702 by Elbert Ewings, RN Outcome: Progressing 01/29/2023 0701 by Elbert Ewings, RN Outcome: Progressing Goal: Cardiovascular complication will be avoided 01/29/2023 0702 by Elbert Ewings, RN Outcome: Progressing 01/29/2023 0702 by Elbert Ewings, RN Outcome: Progressing 01/29/2023 0701 by Elbert Ewings, RN Outcome: Progressing   Problem: Activity: Goal: Risk for activity intolerance will  decrease 01/29/2023 0702 by Elbert Ewings, RN Outcome: Progressing 01/29/2023 0702 by Elbert Ewings, RN Outcome: Progressing 01/29/2023 0701 by Elbert Ewings, RN Outcome: Progressing   Problem: Nutrition: Goal: Adequate nutrition will be maintained 01/29/2023 0702 by Elbert Ewings, RN Outcome: Progressing 01/29/2023 0702 by Elbert Ewings, RN Outcome: Progressing 01/29/2023 0701 by Elbert Ewings, RN Outcome: Progressing   Problem: Coping: Goal: Level of anxiety will decrease 01/29/2023 0702 by Elbert Ewings, RN Outcome: Progressing 01/29/2023 0702 by Elbert Ewings, RN Outcome: Progressing 01/29/2023 0701 by Elbert Ewings, RN Outcome: Progressing   Problem: Elimination: Goal: Will not experience complications related to bowel motility 01/29/2023 0702 by Elbert Ewings, RN Outcome: Progressing 01/29/2023 0702 by Elbert Ewings, RN Outcome: Progressing 01/29/2023 0701 by Elbert Ewings, RN Outcome: Progressing Goal: Will not experience complications related to urinary retention 01/29/2023 0702 by Elbert Ewings, RN Outcome: Progressing 01/29/2023 0702 by Elbert Ewings, RN Outcome: Progressing 01/29/2023 0701 by Elbert Ewings, RN Outcome: Progressing   Problem: Pain Managment: Goal: General experience of comfort will improve 01/29/2023 0702 by Elbert Ewings, RN Outcome: Progressing 01/29/2023 0702 by Elbert Ewings, RN Outcome: Progressing 01/29/2023 0701 by Elbert Ewings, RN Outcome: Progressing   Problem: Safety: Goal: Ability to remain free from injury will improve 01/29/2023 0702 by Elbert Ewings, RN Outcome: Progressing 01/29/2023 0702 by Elbert Ewings, RN Outcome: Progressing 01/29/2023 0701 by Elbert Ewings, RN Outcome: Progressing   Problem: Skin Integrity: Goal: Risk for impaired skin integrity will decrease 01/29/2023 0702 by Elbert Ewings, RN Outcome: Progressing 01/29/2023 0702 by Elbert Ewings, RN Outcome:  Progressing 01/29/2023 0701 by Elbert Ewings, RN Outcome: Progressing   Problem: Education: Goal: Knowledge of [place] [facility] Education information/materials will improve 01/29/2023 0702 by Elbert Ewings, RN Outcome: Progressing 01/29/2023 0702 by Estelle June  A, RN Outcome: Progressing 01/29/2023 0701 by Elbert Ewings, RN Outcome: Progressing Goal: Emotional status will improve 01/29/2023 0702 by Elbert Ewings, RN Outcome: Progressing 01/29/2023 0702 by Elbert Ewings, RN Outcome: Progressing 01/29/2023 0701 by Elbert Ewings, RN Outcome: Progressing Goal: Mental status will improve 01/29/2023 0702 by Elbert Ewings, RN Outcome: Progressing 01/29/2023 0702 by Elbert Ewings, RN Outcome: Progressing 01/29/2023 0701 by Elbert Ewings, RN Outcome: Progressing Goal: Verbalization of understanding the information provided will improve 01/29/2023 0702 by Elbert Ewings, RN Outcome: Progressing 01/29/2023 0702 by Elbert Ewings, RN Outcome: Progressing 01/29/2023 0701 by Elbert Ewings, RN Outcome: Progressing   Problem: Activity: Goal: Interest or engagement in activities will improve 01/29/2023 0702 by Elbert Ewings, RN Outcome: Progressing 01/29/2023 0702 by Elbert Ewings, RN Outcome: Progressing 01/29/2023 0701 by Elbert Ewings, RN Outcome: Progressing Goal: Sleeping patterns will improve 01/29/2023 0702 by Elbert Ewings, RN Outcome: Progressing 01/29/2023 0702 by Elbert Ewings, RN Outcome: Progressing 01/29/2023 0701 by Elbert Ewings, RN Outcome: Progressing   Problem: Coping: Goal: Ability to verbalize frustrations and anger appropriately will improve 01/29/2023 0702 by Elbert Ewings, RN Outcome: Progressing 01/29/2023 0702 by Elbert Ewings, RN Outcome: Progressing 01/29/2023 0701 by Elbert Ewings, RN Outcome: Progressing Goal: Ability to demonstrate self-control will improve 01/29/2023 0702 by Elbert Ewings, RN Outcome:  Progressing 01/29/2023 0702 by Elbert Ewings, RN Outcome: Progressing 01/29/2023 0701 by Elbert Ewings, RN Outcome: Progressing   Problem: Health Behavior/Discharge Planning: Goal: Identification of resources available to assist in meeting health care needs will improve 01/29/2023 0702 by Elbert Ewings, RN Outcome: Progressing 01/29/2023 0702 by Elbert Ewings, RN Outcome: Progressing 01/29/2023 0701 by Elbert Ewings, RN Outcome: Progressing Goal: Compliance with treatment plan for underlying cause of condition will improve 01/29/2023 0702 by Elbert Ewings, RN Outcome: Progressing 01/29/2023 0702 by Elbert Ewings, RN Outcome: Progressing 01/29/2023 0701 by Elbert Ewings, RN Outcome: Progressing   Problem: Physical Regulation: Goal: Ability to maintain clinical measurements within normal limits will improve 01/29/2023 0702 by Elbert Ewings, RN Outcome: Progressing 01/29/2023 0702 by Elbert Ewings, RN Outcome: Progressing 01/29/2023 0701 by Elbert Ewings, RN Outcome: Progressing   Problem: Safety: Goal: Periods of time without injury will increase 01/29/2023 0702 by Elbert Ewings, RN Outcome: Progressing 01/29/2023 0702 by Elbert Ewings, RN Outcome: Progressing 01/29/2023 0701 by Elbert Ewings, RN Outcome: Progressing   Problem: Education: Goal: Knowledge of [place] [facility] Education information/materials will improve 01/29/2023 0702 by Elbert Ewings, RN Outcome: Progressing 01/29/2023 0702 by Elbert Ewings, RN Outcome: Progressing 01/29/2023 0701 by Elbert Ewings, RN Outcome: Progressing Goal: Emotional status will improve 01/29/2023 0702 by Elbert Ewings, RN Outcome: Progressing 01/29/2023 0702 by Elbert Ewings, RN Outcome: Progressing 01/29/2023 0701 by Elbert Ewings, RN Outcome: Progressing Goal: Mental status will improve 01/29/2023 0702 by Elbert Ewings, RN Outcome: Progressing 01/29/2023 0702 by Elbert Ewings, RN Outcome:  Progressing 01/29/2023 0701 by Elbert Ewings, RN Outcome: Progressing Goal: Verbalization of understanding the information provided will improve 01/29/2023 0702 by Elbert Ewings, RN Outcome: Progressing 01/29/2023 0702 by Elbert Ewings, RN Outcome: Progressing 01/29/2023 0701 by Elbert Ewings, RN Outcome: Progressing   Problem: Coping: Goal: Ability to verbalize frustrations and anger appropriately will improve 01/29/2023 0702 by Elbert Ewings, RN Outcome: Progressing 01/29/2023 0702 by Elbert Ewings, RN  Outcome: Progressing 01/29/2023 0701 by Elbert Ewings, RN Outcome: Progressing Goal: Ability to demonstrate self-control will improve 01/29/2023 0702 by Elbert Ewings, RN Outcome: Progressing 01/29/2023 0702 by Elbert Ewings, RN Outcome: Progressing 01/29/2023 0701 by Elbert Ewings, RN Outcome: Progressing   Problem: Health Behavior/Discharge Planning: Goal: Identification of resources available to assist in meeting health care needs will improve 01/29/2023 0702 by Elbert Ewings, RN Outcome: Progressing 01/29/2023 0702 by Elbert Ewings, RN Outcome: Progressing 01/29/2023 0701 by Elbert Ewings, RN Outcome: Progressing Goal: Compliance with treatment plan for underlying cause of condition will improve 01/29/2023 0702 by Elbert Ewings, RN Outcome: Progressing 01/29/2023 0702 by Elbert Ewings, RN Outcome: Progressing 01/29/2023 0701 by Elbert Ewings, RN Outcome: Progressing   Problem: Physical Regulation: Goal: Ability to maintain clinical measurements within normal limits will improve 01/29/2023 0702 by Elbert Ewings, RN Outcome: Progressing 01/29/2023 0702 by Elbert Ewings, RN Outcome: Progressing 01/29/2023 0701 by Elbert Ewings, RN Outcome: Progressing   Problem: Safety: Goal: Periods of time without injury will increase 01/29/2023 0702 by Elbert Ewings, RN Outcome: Progressing 01/29/2023 0702 by Elbert Ewings, RN Outcome:  Progressing 01/29/2023 0701 by Elbert Ewings, RN Outcome: Progressing

## 2023-01-29 NOTE — Plan of Care (Signed)
  Problem: Education: Goal: Knowledge of General Education information will improve Description: Including pain rating scale, medication(s)/side effects and non-pharmacologic comfort measures Outcome: Progressing   Problem: Health Behavior/Discharge Planning: Goal: Ability to manage health-related needs will improve Outcome: Progressing   Problem: Clinical Measurements: Goal: Ability to maintain clinical measurements within normal limits will improve Outcome: Progressing Goal: Will remain free from infection Outcome: Progressing Goal: Diagnostic test results will improve Outcome: Progressing Goal: Respiratory complications will improve Outcome: Progressing Goal: Cardiovascular complication will be avoided Outcome: Progressing   Problem: Activity: Goal: Risk for activity intolerance will decrease Outcome: Progressing   Problem: Nutrition: Goal: Adequate nutrition will be maintained Outcome: Progressing   Problem: Coping: Goal: Level of anxiety will decrease Outcome: Progressing   Problem: Elimination: Goal: Will not experience complications related to bowel motility Outcome: Progressing Goal: Will not experience complications related to urinary retention Outcome: Progressing   Problem: Pain Managment: Goal: General experience of comfort will improve Outcome: Progressing   Problem: Safety: Goal: Ability to remain free from injury will improve Outcome: Progressing   Problem: Skin Integrity: Goal: Risk for impaired skin integrity will decrease Outcome: Progressing   Problem: Education: Goal: Knowledge of [place] [facility] Education information/materials will improve Outcome: Progressing Goal: Emotional status will improve Outcome: Progressing Goal: Mental status will improve Outcome: Progressing Goal: Verbalization of understanding the information provided will improve Outcome: Progressing   Problem: Activity: Goal: Interest or engagement in activities will  improve Outcome: Progressing Goal: Sleeping patterns will improve Outcome: Progressing   Problem: Coping: Goal: Ability to verbalize frustrations and anger appropriately will improve Outcome: Progressing Goal: Ability to demonstrate self-control will improve Outcome: Progressing   Problem: Health Behavior/Discharge Planning: Goal: Identification of resources available to assist in meeting health care needs will improve Outcome: Progressing Goal: Compliance with treatment plan for underlying cause of condition will improve Outcome: Progressing   Problem: Physical Regulation: Goal: Ability to maintain clinical measurements within normal limits will improve Outcome: Progressing   Problem: Safety: Goal: Periods of time without injury will increase Outcome: Progressing   Problem: Education: Goal: Knowledge of [place] [facility] Education information/materials will improve Outcome: Progressing Goal: Emotional status will improve Outcome: Progressing Goal: Mental status will improve Outcome: Progressing Goal: Verbalization of understanding the information provided will improve Outcome: Progressing   Problem: Coping: Goal: Ability to verbalize frustrations and anger appropriately will improve Outcome: Progressing Goal: Ability to demonstrate self-control will improve Outcome: Progressing   Problem: Health Behavior/Discharge Planning: Goal: Identification of resources available to assist in meeting health care needs will improve Outcome: Progressing Goal: Compliance with treatment plan for underlying cause of condition will improve Outcome: Progressing   Problem: Physical Regulation: Goal: Ability to maintain clinical measurements within normal limits will improve Outcome: Progressing   Problem: Safety: Goal: Periods of time without injury will increase Outcome: Progressing

## 2023-01-29 NOTE — Plan of Care (Signed)
Problem: Education: Goal: Knowledge of General Education information will improve Description: Including pain rating scale, medication(s)/side effects and non-pharmacologic comfort measures 01/29/2023 0702 by Nathan Ewings, RN Outcome: Progressing 01/29/2023 0702 by Nathan Ewings, RN Outcome: Progressing 01/29/2023 0702 by Nathan Ewings, RN Outcome: Progressing 01/29/2023 0701 by Nathan Ewings, RN Outcome: Progressing   Problem: Health Behavior/Discharge Planning: Goal: Ability to manage health-related needs will improve 01/29/2023 0702 by Nathan Ewings, RN Outcome: Progressing 01/29/2023 0702 by Nathan Ewings, RN Outcome: Progressing 01/29/2023 0702 by Nathan Ewings, RN Outcome: Progressing 01/29/2023 0701 by Nathan Ewings, RN Outcome: Progressing   Problem: Clinical Measurements: Goal: Ability to maintain clinical measurements within normal limits will improve 01/29/2023 0702 by Nathan Ewings, RN Outcome: Progressing 01/29/2023 0702 by Nathan Ewings, RN Outcome: Progressing 01/29/2023 0702 by Nathan Ewings, RN Outcome: Progressing 01/29/2023 0701 by Nathan Ewings, RN Outcome: Progressing Goal: Will remain free from infection 01/29/2023 0702 by Nathan Ewings, RN Outcome: Progressing 01/29/2023 0702 by Nathan Ewings, RN Outcome: Progressing 01/29/2023 0702 by Nathan Ewings, RN Outcome: Progressing 01/29/2023 0701 by Nathan Ewings, RN Outcome: Progressing Goal: Diagnostic test results will improve 01/29/2023 0702 by Nathan Ewings, RN Outcome: Progressing 01/29/2023 0702 by Nathan Ewings, RN Outcome: Progressing 01/29/2023 0702 by Nathan Ewings, RN Outcome: Progressing 01/29/2023 0701 by Nathan Ewings, RN Outcome: Progressing Goal: Respiratory complications will improve 01/29/2023 0702 by Nathan Ewings, RN Outcome: Progressing 01/29/2023 0702 by Nathan Ewings, RN Outcome: Progressing 01/29/2023 0702 by Nathan Ewings, RN Outcome:  Progressing 01/29/2023 0701 by Nathan Ewings, RN Outcome: Progressing Goal: Cardiovascular complication will be avoided 01/29/2023 0702 by Nathan Ewings, RN Outcome: Progressing 01/29/2023 0702 by Nathan Ewings, RN Outcome: Progressing 01/29/2023 0702 by Nathan Ewings, RN Outcome: Progressing 01/29/2023 0701 by Nathan Ewings, RN Outcome: Progressing   Problem: Activity: Goal: Risk for activity intolerance will decrease 01/29/2023 0702 by Nathan Ewings, RN Outcome: Progressing 01/29/2023 0702 by Nathan Ewings, RN Outcome: Progressing 01/29/2023 0702 by Nathan Ewings, RN Outcome: Progressing 01/29/2023 0701 by Nathan Ewings, RN Outcome: Progressing   Problem: Nutrition: Goal: Adequate nutrition will be maintained 01/29/2023 0702 by Nathan Ewings, RN Outcome: Progressing 01/29/2023 0702 by Nathan Ewings, RN Outcome: Progressing 01/29/2023 0702 by Nathan Ewings, RN Outcome: Progressing 01/29/2023 0701 by Nathan Ewings, RN Outcome: Progressing   Problem: Coping: Goal: Level of anxiety will decrease 01/29/2023 0702 by Nathan Ewings, RN Outcome: Progressing 01/29/2023 0702 by Nathan Ewings, RN Outcome: Progressing 01/29/2023 0702 by Nathan Ewings, RN Outcome: Progressing 01/29/2023 0701 by Nathan Ewings, RN Outcome: Progressing   Problem: Elimination: Goal: Will not experience complications related to bowel motility 01/29/2023 0702 by Nathan Ewings, RN Outcome: Progressing 01/29/2023 0702 by Nathan Ewings, RN Outcome: Progressing 01/29/2023 0702 by Nathan Ewings, RN Outcome: Progressing 01/29/2023 0701 by Nathan Ewings, RN Outcome: Progressing Goal: Will not experience complications related to urinary retention 01/29/2023 0702 by Nathan Ewings, RN Outcome: Progressing 01/29/2023 0702 by Nathan Ewings, RN Outcome: Progressing 01/29/2023 0702 by Nathan Ewings, RN Outcome: Progressing 01/29/2023 0701 by Nathan Ewings, RN Outcome:  Progressing   Problem: Pain Managment: Goal: General experience of comfort will improve 01/29/2023 0702 by Nathan Ewings, RN Outcome: Progressing 01/29/2023 0702 by Nathan Ewings, RN Outcome: Progressing 01/29/2023 0702 by Nathan Ewings, RN Outcome: Progressing  01/29/2023 0701 by Nathan Ewings, RN Outcome: Progressing   Problem: Safety: Goal: Ability to remain free from injury will improve 01/29/2023 0702 by Nathan Ewings, RN Outcome: Progressing 01/29/2023 0702 by Nathan Ewings, RN Outcome: Progressing 01/29/2023 0702 by Nathan Ewings, RN Outcome: Progressing 01/29/2023 0701 by Nathan Ewings, RN Outcome: Progressing   Problem: Skin Integrity: Goal: Risk for impaired skin integrity will decrease 01/29/2023 0702 by Nathan Ewings, RN Outcome: Progressing 01/29/2023 0702 by Nathan Ewings, RN Outcome: Progressing 01/29/2023 0702 by Nathan Ewings, RN Outcome: Progressing 01/29/2023 0701 by Nathan Ewings, RN Outcome: Progressing   Problem: Education: Goal: Knowledge of [place] [facility] Education information/materials will improve 01/29/2023 0702 by Nathan Ewings, RN Outcome: Progressing 01/29/2023 0702 by Nathan Ewings, RN Outcome: Progressing 01/29/2023 0702 by Nathan Ewings, RN Outcome: Progressing 01/29/2023 0701 by Nathan Ewings, RN Outcome: Progressing Goal: Emotional status will improve 01/29/2023 0702 by Nathan Ewings, RN Outcome: Progressing 01/29/2023 0702 by Nathan Ewings, RN Outcome: Progressing 01/29/2023 0702 by Nathan Ewings, RN Outcome: Progressing 01/29/2023 0701 by Nathan Ewings, RN Outcome: Progressing Goal: Mental status will improve 01/29/2023 0702 by Nathan Ewings, RN Outcome: Progressing 01/29/2023 0702 by Nathan Ewings, RN Outcome: Progressing 01/29/2023 0702 by Nathan Ewings, RN Outcome: Progressing 01/29/2023 0701 by Nathan Ewings, RN Outcome: Progressing Goal: Verbalization of understanding the information  provided will improve 01/29/2023 0702 by Nathan Ewings, RN Outcome: Progressing 01/29/2023 0702 by Nathan Ewings, RN Outcome: Progressing 01/29/2023 0702 by Nathan Ewings, RN Outcome: Progressing 01/29/2023 0701 by Nathan Ewings, RN Outcome: Progressing   Problem: Activity: Goal: Interest or engagement in activities will improve 01/29/2023 0702 by Nathan Ewings, RN Outcome: Progressing 01/29/2023 0702 by Nathan Ewings, RN Outcome: Progressing 01/29/2023 0702 by Nathan Ewings, RN Outcome: Progressing 01/29/2023 0701 by Nathan Ewings, RN Outcome: Progressing Goal: Sleeping patterns will improve 01/29/2023 0702 by Nathan Ewings, RN Outcome: Progressing 01/29/2023 0702 by Nathan Ewings, RN Outcome: Progressing 01/29/2023 0702 by Nathan Ewings, RN Outcome: Progressing 01/29/2023 0701 by Nathan Ewings, RN Outcome: Progressing   Problem: Coping: Goal: Ability to verbalize frustrations and anger appropriately will improve 01/29/2023 0702 by Nathan Ewings, RN Outcome: Progressing 01/29/2023 0702 by Nathan Ewings, RN Outcome: Progressing 01/29/2023 0702 by Nathan Ewings, RN Outcome: Progressing 01/29/2023 0701 by Nathan Ewings, RN Outcome: Progressing Goal: Ability to demonstrate self-control will improve 01/29/2023 0702 by Nathan Ewings, RN Outcome: Progressing 01/29/2023 0702 by Nathan Ewings, RN Outcome: Progressing 01/29/2023 0702 by Nathan Ewings, RN Outcome: Progressing 01/29/2023 0701 by Nathan Ewings, RN Outcome: Progressing   Problem: Health Behavior/Discharge Planning: Goal: Identification of resources available to assist in meeting health care needs will improve 01/29/2023 0702 by Nathan Ewings, RN Outcome: Progressing 01/29/2023 0702 by Nathan Ewings, RN Outcome: Progressing 01/29/2023 0702 by Nathan Ewings, RN Outcome: Progressing 01/29/2023 0701 by Nathan Ewings, RN Outcome: Progressing Goal: Compliance with treatment plan for  underlying cause of condition will improve 01/29/2023 0702 by Nathan Ewings, RN Outcome: Progressing 01/29/2023 0702 by Nathan Ewings, RN Outcome: Progressing 01/29/2023 0702 by Nathan Ewings, RN Outcome: Progressing 01/29/2023 0701 by Nathan Ewings, RN Outcome: Progressing   Problem: Physical Regulation: Goal: Ability to maintain clinical measurements within normal limits will improve 01/29/2023 0702 by Nathan Ewings, RN Outcome: Progressing 01/29/2023 0702 by  Nathan Ewings, RN Outcome: Progressing 01/29/2023 4696 by Nathan Ewings, RN Outcome: Progressing 01/29/2023 0701 by Nathan Ewings, RN Outcome: Progressing   Problem: Safety: Goal: Periods of time without injury will increase 01/29/2023 0702 by Nathan Ewings, RN Outcome: Progressing 01/29/2023 0702 by Nathan Ewings, RN Outcome: Progressing 01/29/2023 0702 by Nathan Ewings, RN Outcome: Progressing 01/29/2023 0701 by Nathan Ewings, RN Outcome: Progressing   Problem: Education: Goal: Knowledge of [place] [facility] Education information/materials will improve 01/29/2023 [id number] by Nathan Ewings, RN Outcome: Progressing 01/29/2023 0702 by Nathan Ewings, RN Outcome: Progressing 01/29/2023 0702 by Nathan Ewings, RN Outcome: Progressing 01/29/2023 0701 by Nathan Ewings, RN Outcome: Progressing Goal: Emotional status will improve 01/29/2023 0702 by Nathan Ewings, RN Outcome: Progressing 01/29/2023 0702 by Nathan Ewings, RN Outcome: Progressing 01/29/2023 0702 by Nathan Ewings, RN Outcome: Progressing 01/29/2023 0701 by Nathan Ewings, RN Outcome: Progressing Goal: Mental status will improve 01/29/2023 0702 by Nathan Ewings, RN Outcome: Progressing 01/29/2023 0702 by Nathan Ewings, RN Outcome: Progressing 01/29/2023 0702 by Nathan Ewings, RN Outcome: Progressing 01/29/2023 0701 by Nathan Ewings, RN Outcome: Progressing Goal: Verbalization of understanding the information provided will  improve 01/29/2023 0702 by Nathan Ewings, RN Outcome: Progressing 01/29/2023 0702 by Nathan Ewings, RN Outcome: Progressing 01/29/2023 0702 by Nathan Ewings, RN Outcome: Progressing 01/29/2023 0701 by Nathan Ewings, RN Outcome: Progressing   Problem: Coping: Goal: Ability to verbalize frustrations and anger appropriately will improve 01/29/2023 0702 by Nathan Ewings, RN Outcome: Progressing 01/29/2023 0702 by Nathan Ewings, RN Outcome: Progressing 01/29/2023 0702 by Nathan Ewings, RN Outcome: Progressing 01/29/2023 0701 by Nathan Ewings, RN Outcome: Progressing Goal: Ability to demonstrate self-control will improve 01/29/2023 0702 by Nathan Ewings, RN Outcome: Progressing 01/29/2023 0702 by Nathan Ewings, RN Outcome: Progressing 01/29/2023 0702 by Nathan Ewings, RN Outcome: Progressing 01/29/2023 0701 by Nathan Ewings, RN Outcome: Progressing   Problem: Health Behavior/Discharge Planning: Goal: Identification of resources available to assist in meeting health care needs will improve 01/29/2023 0702 by Nathan Ewings, RN Outcome: Progressing 01/29/2023 0702 by Nathan Ewings, RN Outcome: Progressing 01/29/2023 0702 by Nathan Ewings, RN Outcome: Progressing 01/29/2023 0701 by Nathan Ewings, RN Outcome: Progressing Goal: Compliance with treatment plan for underlying cause of condition will improve 01/29/2023 0702 by Nathan Ewings, RN Outcome: Progressing 01/29/2023 0702 by Nathan Ewings, RN Outcome: Progressing 01/29/2023 0702 by Nathan Ewings, RN Outcome: Progressing 01/29/2023 0701 by Nathan Ewings, RN Outcome: Progressing   Problem: Physical Regulation: Goal: Ability to maintain clinical measurements within normal limits will improve 01/29/2023 0702 by Nathan Ewings, RN Outcome: Progressing 01/29/2023 0702 by Nathan Ewings, RN Outcome: Progressing 01/29/2023 0702 by Nathan Ewings, RN Outcome: Progressing 01/29/2023 0701 by Nathan Ewings,  RN Outcome: Progressing   Problem: Safety: Goal: Periods of time without injury will increase 01/29/2023 0702 by Nathan Ewings, RN Outcome: Progressing 01/29/2023 0702 by Nathan Ewings, RN Outcome: Progressing 01/29/2023 0702 by Nathan Ewings, RN Outcome: Progressing 01/29/2023 0701 by Nathan Ewings, RN Outcome: Progressing

## 2023-01-29 NOTE — [facility] Counselor (Signed)
CSW confirmed with [facility] pt's appointment for Monday 10/7 at 1 PM  Reynaldo Minium, MSW, [facility] 01/29/2023 10:27 AM

## 2023-01-29 NOTE — Plan of Care (Signed)
Problem: Education: Goal: Knowledge of General Education information will improve Description: Including pain rating scale, medication(s)/side effects and non-pharmacologic comfort measures 01/29/2023 0702 by Nathan Ewings, RN Outcome: Progressing 01/29/2023 0701 by Nathan Ewings, RN Outcome: Progressing   Problem: Health Behavior/Discharge Planning: Goal: Ability to manage health-related needs will improve 01/29/2023 0702 by Nathan Ewings, RN Outcome: Progressing 01/29/2023 0701 by Nathan Ewings, RN Outcome: Progressing   Problem: Clinical Measurements: Goal: Ability to maintain clinical measurements within normal limits will improve 01/29/2023 0702 by Nathan Ewings, RN Outcome: Progressing 01/29/2023 0701 by Nathan Ewings, RN Outcome: Progressing Goal: Will remain free from infection 01/29/2023 0702 by Nathan Ewings, RN Outcome: Progressing 01/29/2023 0701 by Nathan Ewings, RN Outcome: Progressing Goal: Diagnostic test results will improve 01/29/2023 0702 by Nathan Ewings, RN Outcome: Progressing 01/29/2023 0701 by Nathan Ewings, RN Outcome: Progressing Goal: Respiratory complications will improve 01/29/2023 0702 by Nathan Ewings, RN Outcome: Progressing 01/29/2023 0701 by Nathan Ewings, RN Outcome: Progressing Goal: Cardiovascular complication will be avoided 01/29/2023 0702 by Nathan Ewings, RN Outcome: Progressing 01/29/2023 0701 by Nathan Ewings, RN Outcome: Progressing   Problem: Activity: Goal: Risk for activity intolerance will decrease 01/29/2023 0702 by Nathan Ewings, RN Outcome: Progressing 01/29/2023 0701 by Nathan Ewings, RN Outcome: Progressing   Problem: Nutrition: Goal: Adequate nutrition will be maintained 01/29/2023 0702 by Nathan Ewings, RN Outcome: Progressing 01/29/2023 0701 by Nathan Ewings, RN Outcome: Progressing   Problem: Coping: Goal: Level of anxiety will decrease 01/29/2023 0702 by Nathan Ewings, RN Outcome:  Progressing 01/29/2023 0701 by Nathan Ewings, RN Outcome: Progressing   Problem: Elimination: Goal: Will not experience complications related to bowel motility 01/29/2023 0702 by Nathan Ewings, RN Outcome: Progressing 01/29/2023 0701 by Nathan Ewings, RN Outcome: Progressing Goal: Will not experience complications related to urinary retention 01/29/2023 0702 by Nathan Ewings, RN Outcome: Progressing 01/29/2023 0701 by Nathan Ewings, RN Outcome: Progressing   Problem: Pain Managment: Goal: General experience of comfort will improve 01/29/2023 0702 by Nathan Ewings, RN Outcome: Progressing 01/29/2023 0701 by Nathan Ewings, RN Outcome: Progressing   Problem: Safety: Goal: Ability to remain free from injury will improve 01/29/2023 0702 by Nathan Ewings, RN Outcome: Progressing 01/29/2023 0701 by Nathan Ewings, RN Outcome: Progressing   Problem: Skin Integrity: Goal: Risk for impaired skin integrity will decrease 01/29/2023 0702 by Nathan Ewings, RN Outcome: Progressing 01/29/2023 0701 by Nathan Ewings, RN Outcome: Progressing   Problem: Education: Goal: Knowledge of [place] [facility] Education information/materials will improve 01/29/2023 0702 by Nathan Ewings, RN Outcome: Progressing 01/29/2023 0701 by Nathan Ewings, RN Outcome: Progressing Goal: Emotional status will improve 01/29/2023 0702 by Nathan Ewings, RN Outcome: Progressing 01/29/2023 0701 by Nathan Ewings, RN Outcome: Progressing Goal: Mental status will improve 01/29/2023 0702 by Nathan Ewings, RN Outcome: Progressing 01/29/2023 0701 by Nathan Ewings, RN Outcome: Progressing Goal: Verbalization of understanding the information provided will improve 01/29/2023 0702 by Nathan Ewings, RN Outcome: Progressing 01/29/2023 0701 by Nathan Ewings, RN Outcome: Progressing   Problem: Activity: Goal: Interest or engagement in activities will improve 01/29/2023 0702 by Nathan Ewings,  RN Outcome: Progressing 01/29/2023 0701 by Nathan Ewings, RN Outcome: Progressing Goal: Sleeping patterns will improve 01/29/2023 0702 by Nathan Ewings, RN Outcome: Progressing 01/29/2023 0701 by Nathan Ewings, RN Outcome: Progressing   Problem: Coping: Goal:  Ability to verbalize frustrations and anger appropriately will improve 01/29/2023 0702 by Nathan Ewings, RN Outcome: Progressing 01/29/2023 0701 by Nathan Ewings, RN Outcome: Progressing Goal: Ability to demonstrate self-control will improve 01/29/2023 0702 by Nathan Ewings, RN Outcome: Progressing 01/29/2023 0701 by Nathan Ewings, RN Outcome: Progressing   Problem: Health Behavior/Discharge Planning: Goal: Identification of resources available to assist in meeting health care needs will improve 01/29/2023 0702 by Nathan Ewings, RN Outcome: Progressing 01/29/2023 0701 by Nathan Ewings, RN Outcome: Progressing Goal: Compliance with treatment plan for underlying cause of condition will improve 01/29/2023 0702 by Nathan Ewings, RN Outcome: Progressing 01/29/2023 0701 by Nathan Ewings, RN Outcome: Progressing   Problem: Physical Regulation: Goal: Ability to maintain clinical measurements within normal limits will improve 01/29/2023 0702 by Nathan Ewings, RN Outcome: Progressing 01/29/2023 0701 by Nathan Ewings, RN Outcome: Progressing   Problem: Safety: Goal: Periods of time without injury will increase 01/29/2023 0702 by Nathan Ewings, RN Outcome: Progressing 01/29/2023 0701 by Nathan Ewings, RN Outcome: Progressing   Problem: Education: Goal: Knowledge of [place] [facility] Education information/materials will improve 01/29/2023 0702 by Nathan Ewings, RN Outcome: Progressing 01/29/2023 0701 by Nathan Ewings, RN Outcome: Progressing Goal: Emotional status will improve 01/29/2023 0702 by Nathan Ewings, RN Outcome: Progressing 01/29/2023 0701 by Nathan Ewings, RN Outcome:  Progressing Goal: Mental status will improve 01/29/2023 0702 by Nathan Ewings, RN Outcome: Progressing 01/29/2023 0701 by Nathan Ewings, RN Outcome: Progressing Goal: Verbalization of understanding the information provided will improve 01/29/2023 0702 by Nathan Ewings, RN Outcome: Progressing 01/29/2023 0701 by Nathan Ewings, RN Outcome: Progressing   Problem: Coping: Goal: Ability to verbalize frustrations and anger appropriately will improve 01/29/2023 0702 by Nathan Ewings, RN Outcome: Progressing 01/29/2023 0701 by Nathan Ewings, RN Outcome: Progressing Goal: Ability to demonstrate self-control will improve 01/29/2023 0702 by Nathan Ewings, RN Outcome: Progressing 01/29/2023 0701 by Nathan Ewings, RN Outcome: Progressing   Problem: Health Behavior/Discharge Planning: Goal: Identification of resources available to assist in meeting health care needs will improve 01/29/2023 0702 by Nathan Ewings, RN Outcome: Progressing 01/29/2023 0701 by Nathan Ewings, RN Outcome: Progressing Goal: Compliance with treatment plan for underlying cause of condition will improve 01/29/2023 0702 by Nathan Ewings, RN Outcome: Progressing 01/29/2023 0701 by Nathan Ewings, RN Outcome: Progressing   Problem: Physical Regulation: Goal: Ability to maintain clinical measurements within normal limits will improve 01/29/2023 0702 by Nathan Ewings, RN Outcome: Progressing 01/29/2023 0701 by Nathan Ewings, RN Outcome: Progressing   Problem: Safety: Goal: Periods of time without injury will increase 01/29/2023 0702 by Nathan Ewings, RN Outcome: Progressing 01/29/2023 0701 by Nathan Ewings, RN Outcome: Progressing

## 2023-01-29 NOTE — Group Note (Signed)
Date:  01/29/2023 Time:  3:29 PM  Group Topic/Focus:  Activity Group:  The focus of the group is to encourage patients to come outside to the courtyard to get some fresh air and some exercise as well to benefit their mental health treatment.    Participation Level:  Active  Participation Quality:  Appropriate  Affect:  Appropriate  Cognitive:  Appropriate  Insight: Appropriate  Engagement in Group:  Engaged  Modes of Intervention:  Activity  Additional Comments:    Nathan Koch Nathan Koch Nathan Koch Nathan Koch 01/29/2023, 3:29 PM

## 2023-01-29 NOTE — Plan of Care (Signed)
  Problem: Nutrition: Goal: Adequate nutrition will be maintained Outcome: Progressing   Problem: Coping: Goal: Level of anxiety will decrease Outcome: Progressing   Problem: Pain Managment: Goal: General experience of comfort will improve Outcome: Progressing   

## 2023-01-30 NOTE — Plan of Care (Signed)
D- Patient alert and oriented. Patient presented in a pleasant mood on assessment reporting that he slept good last night and had complaints of back and hip pain, rating his pain a "7/10", but he didn't request any PRN pain medication from this Clinical research associate. Patient continues to endorse hopelessness, depression, and anxiety, rating them all a "7/10", stating that "it's just a feeling", when asked why he's feeling this way. Patient denied SI, HI, AVH at this time, stating that he is feeling "pretty good". Patient's goal for today, per his self-inventory is "how to get my things from [facility]", in which he will "talk to case worker", in order to achieve his goal.  A- Scheduled medications administered to patient, per MD orders. Support and encouragement provided.  Routine safety checks conducted every 15 minutes. Patient informed to notify staff with problems or concerns.  R- No adverse drug reactions noted. Patient contracts for safety at this time. Patient compliant with medications and treatment plan. Patient receptive, calm, and cooperative. Patient interacts well with others on the unit. Patient remains safe at this time.  Problem: Education: Goal: Knowledge of General Education information will improve Description: Including pain rating scale, medication(s)/side effects and non-pharmacologic comfort measures Outcome: Progressing   Problem: Health Behavior/Discharge Planning: Goal: Ability to manage health-related needs will improve Outcome: Progressing   Problem: Clinical Measurements: Goal: Ability to maintain clinical measurements within normal limits will improve Outcome: Progressing Goal: Will remain free from infection Outcome: Progressing Goal: Diagnostic test results will improve Outcome: Progressing Goal: Respiratory complications will improve Outcome: Progressing Goal: Cardiovascular complication will be avoided Outcome: Progressing   Problem: Activity: Goal: Risk for activity  intolerance will decrease Outcome: Progressing   Problem: Nutrition: Goal: Adequate nutrition will be maintained Outcome: Progressing   Problem: Coping: Goal: Level of anxiety will decrease Outcome: Progressing   Problem: Elimination: Goal: Will not experience complications related to bowel motility Outcome: Progressing Goal: Will not experience complications related to urinary retention Outcome: Progressing   Problem: Pain Managment: Goal: General experience of comfort will improve Outcome: Progressing   Problem: Safety: Goal: Ability to remain free from injury will improve Outcome: Progressing   Problem: Skin Integrity: Goal: Risk for impaired skin integrity will decrease Outcome: Progressing   Problem: Education: Goal: Knowledge of [place] [facility] Education information/materials will improve Outcome: Progressing Goal: Emotional status will improve Outcome: Progressing Goal: Mental status will improve Outcome: Progressing Goal: Verbalization of understanding the information provided will improve Outcome: Progressing   Problem: Activity: Goal: Interest or engagement in activities will improve Outcome: Progressing Goal: Sleeping patterns will improve Outcome: Progressing   Problem: Coping: Goal: Ability to verbalize frustrations and anger appropriately will improve Outcome: Progressing Goal: Ability to demonstrate self-control will improve Outcome: Progressing   Problem: Health Behavior/Discharge Planning: Goal: Identification of resources available to assist in meeting health care needs will improve Outcome: Progressing Goal: Compliance with treatment plan for underlying cause of condition will improve Outcome: Progressing   Problem: Physical Regulation: Goal: Ability to maintain clinical measurements within normal limits will improve Outcome: Progressing   Problem: Safety: Goal: Periods of time without injury will increase Outcome: Progressing    Problem: Education: Goal: Knowledge of [place] [facility] Education information/materials will improve Outcome: Progressing Goal: Emotional status will improve Outcome: Progressing Goal: Mental status will improve Outcome: Progressing Goal: Verbalization of understanding the information provided will improve Outcome: Progressing   Problem: Coping: Goal: Ability to verbalize frustrations and anger appropriately will improve Outcome: Progressing Goal: Ability to demonstrate  self-control will improve Outcome: Progressing   Problem: Health Behavior/Discharge Planning: Goal: Identification of resources available to assist in meeting health care needs will improve Outcome: Progressing Goal: Compliance with treatment plan for underlying cause of condition will improve Outcome: Progressing   Problem: Physical Regulation: Goal: Ability to maintain clinical measurements within normal limits will improve Outcome: Progressing   Problem: Safety: Goal: Periods of time without injury will increase Outcome: Progressing

## 2023-01-30 NOTE — Plan of Care (Signed)
  Problem: Education: Goal: Knowledge of General Education information will improve Description: Including pain rating scale, medication(s)/side effects and non-pharmacologic comfort measures Outcome: Progressing   Problem: Health Behavior/Discharge Planning: Goal: Ability to manage health-related needs will improve Outcome: Progressing   Problem: Clinical Measurements: Goal: Ability to maintain clinical measurements within normal limits will improve Outcome: Progressing Goal: Will remain free from infection Outcome: Progressing Goal: Diagnostic test results will improve Outcome: Progressing Goal: Respiratory complications will improve Outcome: Progressing Goal: Cardiovascular complication will be avoided Outcome: Progressing   Problem: Activity: Goal: Risk for activity intolerance will decrease Outcome: Progressing   Problem: Nutrition: Goal: Adequate nutrition will be maintained Outcome: Progressing   Problem: Coping: Goal: Level of anxiety will decrease Outcome: Progressing   Problem: Elimination: Goal: Will not experience complications related to bowel motility Outcome: Progressing Goal: Will not experience complications related to urinary retention Outcome: Progressing   Problem: Pain Managment: Goal: General experience of comfort will improve Outcome: Progressing   Problem: Safety: Goal: Ability to remain free from injury will improve Outcome: Progressing   Problem: Skin Integrity: Goal: Risk for impaired skin integrity will decrease Outcome: Progressing   Problem: Education: Goal: Knowledge of [place] [facility] Education information/materials will improve Outcome: Progressing Goal: Emotional status will improve Outcome: Progressing Goal: Mental status will improve Outcome: Progressing Goal: Verbalization of understanding the information provided will improve Outcome: Progressing   Problem: Activity: Goal: Interest or engagement in activities will  improve Outcome: Progressing Goal: Sleeping patterns will improve Outcome: Progressing   Problem: Coping: Goal: Ability to verbalize frustrations and anger appropriately will improve Outcome: Progressing Goal: Ability to demonstrate self-control will improve Outcome: Progressing   Problem: Health Behavior/Discharge Planning: Goal: Identification of resources available to assist in meeting health care needs will improve Outcome: Progressing Goal: Compliance with treatment plan for underlying cause of condition will improve Outcome: Progressing   Problem: Physical Regulation: Goal: Ability to maintain clinical measurements within normal limits will improve Outcome: Progressing   Problem: Safety: Goal: Periods of time without injury will increase Outcome: Progressing   Problem: Education: Goal: Knowledge of [place] [facility] Education information/materials will improve Outcome: Progressing Goal: Emotional status will improve Outcome: Progressing Goal: Mental status will improve Outcome: Progressing Goal: Verbalization of understanding the information provided will improve Outcome: Progressing   Problem: Coping: Goal: Ability to verbalize frustrations and anger appropriately will improve Outcome: Progressing Goal: Ability to demonstrate self-control will improve Outcome: Progressing   Problem: Health Behavior/Discharge Planning: Goal: Identification of resources available to assist in meeting health care needs will improve Outcome: Progressing Goal: Compliance with treatment plan for underlying cause of condition will improve Outcome: Progressing   Problem: Physical Regulation: Goal: Ability to maintain clinical measurements within normal limits will improve Outcome: Progressing   Problem: Safety: Goal: Periods of time without injury will increase Outcome: Progressing

## 2023-01-30 NOTE — Group Note (Signed)
LCSW Group Therapy Note   Group Date: 01/30/2023 Start Time: 1300 End Time: 1340   Type of Therapy and Topic:  Group Therapy - Building a Support System  Participation Level:  Active      Summary of Patient Progress: The patient attended group. Patient proved open to input from peers and feedback from [facility]. The patient was respectful of peers and participated throughout the entire session. The patient participated during today's icebreaker questions. The patient stated that he has a good support system.     Marshell Levan, LCSWA 01/30/2023  3:07 PM

## 2023-01-30 NOTE — BH IP Treatment Plan (Signed)
Interdisciplinary Treatment and Diagnostic Plan Update  01/30/2023 Time of Session: 11:35 am Nathan Koch MRN: [id number]  Principal Diagnosis: Other psychoactive substance-induced mood disorder ([name])  Secondary Diagnoses: Principal Problem:   Other psychoactive substance-induced mood disorder ([name])   Current Medications:  Current Facility-Administered Medications  Medication Dose Route Frequency Provider Last Rate Last Admin   acetaminophen (TYLENOL) tablet 650 mg  650 mg Oral Q6H PRN Rankin, Shuvon B, NP   650 mg at 01/29/23 1622   albuterol (VENTOLIN HFA) 108 (90 Base) MCG/ACT inhaler 2 puff  2 puff Inhalation Q4H PRN Sarina Ill, DO   2 puff at 01/28/23 2309   alum & mag hydroxide-simeth (MAALOX/MYLANTA) 200-200-20 MG/5ML suspension 30 mL  30 mL Oral Q4H PRN Rankin, Shuvon B, NP       diphenhydrAMINE (BENADRYL) capsule 50 mg  50 mg Oral TID PRN Rankin, Shuvon B, NP       Or   diphenhydrAMINE (BENADRYL) injection 50 mg  50 mg Intramuscular TID PRN Rankin, Shuvon B, NP       feeding supplement (ENSURE ENLIVE / ENSURE PLUS) liquid 237 mL  1 Bottle Oral BID BM Myriam Forehand, NP   237 mL at 01/30/23 0901   FLUoxetine (PROZAC) capsule 20 mg  20 mg Oral Daily Sarina Ill, DO   20 mg at 01/30/23 0900   hydrocortisone cream 1 %   Topical TID PRN Rankin, Shuvon B, NP   Given at 01/28/23 3086   magnesium hydroxide (MILK OF MAGNESIA) suspension 30 mL  30 mL Oral Daily PRN Rankin, Shuvon B, NP       QUEtiapine (SEROQUEL) tablet 150 mg  150 mg Oral QHS Rankin, Shuvon B, NP   150 mg at 01/29/23 2124   risperiDONE (RISPERDAL) tablet 0.5 mg  0.5 mg Oral [id number] Sarina Ill, DO   0.5 mg at 01/30/23 0900   traZODone (DESYREL) tablet 50 mg  50 mg Oral QHS PRN Rankin, Shuvon B, NP   50 mg at 01/29/23 2124   PTA Medications: Medications Prior to Admission  Medication Sig Dispense Refill Last Dose   albuterol (VENTOLIN HFA) 108 (90 Base) MCG/ACT inhaler Inhale 2  puffs into the lungs every 6 (six) hours as needed for shortness of breath. 6.7 g 1    gabapentin (NEURONTIN) 400 MG capsule Take 400 mg by mouth 3 (three) times daily.      hydrOXYzine (VISTARIL) 50 MG capsule Take 50 mg by mouth daily as needed.      lamoTRIgine (LAMICTAL) 25 MG tablet Take 2 tablets (50 mg total) by mouth daily. 60 tablet 1    metFORMIN (GLUCOPHAGE-XR) 500 MG 24 hr tablet Take 1 tablet by mouth daily with breakfast. (Patient not taking: Reported on 01/15/2023)      QUEtiapine Fumarate 150 MG TABS Take 1 tablet by mouth daily.       Patient Stressors: Financial difficulties   Medication change or noncompliance   Substance abuse   Traumatic event    Patient Strengths: Average or above average intelligence  Communication skills  Physical Health   Treatment Modalities: Medication Management, Group therapy, Case management,  1 to 1 session with clinician, Psychoeducation, Recreational therapy.   Physician Treatment Plan for Primary Diagnosis: Other psychoactive substance-induced mood disorder ([name]) Long Term Goal(s): Improvement in symptoms so as ready for discharge   Short Term Goals: Ability to identify changes in lifestyle to reduce recurrence of condition will improve Ability to verbalize feelings will improve  Ability to disclose and discuss suicidal ideas Ability to demonstrate self-control will improve Ability to identify and develop effective coping behaviors will improve Ability to maintain clinical measurements within normal limits will improve Compliance with prescribed medications will improve Ability to identify triggers associated with substance abuse/mental health issues will improve  Medication Management: Evaluate patient's response, side effects, and tolerance of medication regimen.  Therapeutic Interventions: 1 to 1 sessions, Unit Group sessions and Medication administration.  Evaluation of Outcomes: Progressing  Physician Treatment Plan for  Secondary Diagnosis: Principal Problem:   Other psychoactive substance-induced mood disorder ([name])  Long Term Goal(s): Improvement in symptoms so as ready for discharge   Short Term Goals: Ability to identify changes in lifestyle to reduce recurrence of condition will improve Ability to verbalize feelings will improve Ability to disclose and discuss suicidal ideas Ability to demonstrate self-control will improve Ability to identify and develop effective coping behaviors will improve Ability to maintain clinical measurements within normal limits will improve Compliance with prescribed medications will improve Ability to identify triggers associated with substance abuse/mental health issues will improve     Medication Management: Evaluate patient's response, side effects, and tolerance of medication regimen.  Therapeutic Interventions: 1 to 1 sessions, Unit Group sessions and Medication administration.  Evaluation of Outcomes: Progressing   RN Treatment Plan for Primary Diagnosis: Other psychoactive substance-induced mood disorder ([name]) Long Term Goal(s): Knowledge of disease and therapeutic regimen to maintain health will improve  Short Term Goals: Ability to remain free from injury will improve, Ability to verbalize frustration and anger appropriately will improve, Ability to demonstrate self-control, Ability to participate in decision making will improve, Ability to verbalize feelings will improve, Ability to disclose and discuss suicidal ideas, Ability to identify and develop effective coping behaviors will improve, and Compliance with prescribed medications will improve  Medication Management: RN will administer medications as ordered by provider, will assess and evaluate patient's response and provide education to patient for prescribed medication. RN will report any adverse and/or side effects to prescribing provider.  Therapeutic Interventions: 1 on 1 counseling sessions,  Psychoeducation, Medication administration, Evaluate responses to treatment, Monitor vital signs and CBGs as ordered, Perform/monitor CIWA, COWS, AIMS and Fall Risk screenings as ordered, Perform wound care treatments as ordered.  Evaluation of Outcomes: Progressing   LCSW Treatment Plan for Primary Diagnosis: Other psychoactive substance-induced mood disorder ([name]) Long Term Goal(s): Safe transition to appropriate next level of care at discharge, Engage patient in therapeutic group addressing interpersonal concerns.  Short Term Goals: Engage patient in aftercare planning with referrals and resources, Increase social support, Increase ability to appropriately verbalize feelings, Increase emotional regulation, Facilitate acceptance of mental health diagnosis and concerns, Facilitate patient progression through stages of change regarding substance use diagnoses and concerns, Identify triggers associated with mental health/substance abuse issues, and Increase skills for wellness and recovery  Therapeutic Interventions: Assess for all discharge needs, 1 to 1 time with Social worker, Explore available resources and support systems, Assess for adequacy in community support network, Educate family and significant other(s) on suicide prevention, Complete Psychosocial Assessment, Interpersonal group therapy.  Evaluation of Outcomes: Progressing   Progress in Treatment: Attending groups: Yes. Participating in groups: Yes. Taking medication as prescribed: Yes. Toleration medication: Yes. Family/Significant other contact made: No, will contact:  Patient declined Patient understands diagnosis: Yes. Discussing patient identified problems/goals with staff: Yes. Medical problems stabilized or resolved: Yes. Denies suicidal/homicidal ideation: Yes. Issues/concerns per patient self-inventory: No. Other: none  New problem(s) identified: No, Describe:  none  New Short Term/Long Term Goal(s): detox,  elimination of symptoms of psychosis, medication management for mood stabilization; elimination of SI thoughts; development of comprehensive mental wellness/sobriety plan. Update 01/30/2023: None at this time   Patient Goals:  " I want treatment" Update 01/25/2023: No changes at this time Update 01/30/2023: None at this time   Discharge Plan or Barriers: CSW will assist with appropriate discharge planning Update 01/25/2023: No changes at this time Update 01/30/2023: The patient is scheduled to be discharged on 02/01/23   Reason for Continuation of Hospitalization: Depression Medication stabilization Suicidal ideation   Estimated Length of Stay: 1 to 7 days Update 01/25/2023: No changes at this time Update 01/30/2023: 02/01/23  Last 3 [place] Suicide Severity Risk Score: Flowsheet Row Admission (Current) from 01/18/2023 in [facility] ED from 01/17/2023 in [facility] ED from 01/14/2023 in [facility] at [facility]  C-SSRS RISK CATEGORY Low Risk Moderate Risk Low Risk       Last [facility] 2/9 Scores:    04/15/2021    2:16 AM 02/05/2021    4:55 AM 01/15/2021    8:04 AM  Depression screen PHQ 2/9  Decreased Interest 1 2 1   Down, Depressed, Hopeless 1 2 1   PHQ - 2 Score 2 4 2   Altered sleeping 2 0 0  Tired, decreased energy 2 1 0  Change in appetite 2 0 0  Feeling bad or failure about yourself  2 1 2   Trouble concentrating 2 1 0  Moving slowly or fidgety/restless 2 0   Suicidal thoughts 2 2 2   PHQ-9 Score 16 9 6   Difficult doing work/chores Very difficult Extremely dIfficult     Scribe for Treatment Team: Marshell Levan, LCSW 01/30/2023 11:35 AM

## 2023-01-30 NOTE — Progress Notes (Signed)
[facility] MD Progress Note  01/30/2023 10:11 AM Nathan Koch  MRN:  [id number] Subjective:  The patient is a 32 year old multiracial male who reports that he is awaiting transfer to inpatient rehabilitation, with social work following up on the process. He states that he is tolerating his medications well and has no complaints. The patient denies suicidal ideation (SI), homicidal ideation (HI), psychotic symptoms, or delusional thoughts.he patient denies any adverse effects from his medications and continues to tolerate them well.Actively participating in treatment activities and awaiting transfer to inpatient rehabilitation.The patient is stable and compliant with his medication regimen, reporting no side effects or psychiatric symptoms. His mood appears well-regulated, and there are no acute safety concerns. The patient is currently awaiting transfer to an inpatient rehabilitation facility, and social work is managing the transition. He continues to engage positively in his treatment. Principal Problem: Other psychoactive substance-induced mood disorder ([name]) Diagnosis: Principal Problem:   Other psychoactive substance-induced mood disorder ([name])  Total Time spent with patient: 45 minutes  Past Psychiatric History: Polysubstance abuse Depression  Past Medical History:  Past Medical History:  Diagnosis Date   Anxiety    Anxiety disorder    Asthma    Bipolar 1 disorder ([name])    Depression    Methamphetamine abuse ([name])    PTSD (post-traumatic stress disorder)     Past Surgical History:  Procedure Laterality Date   CLOSED REDUCTION MANDIBLE N/A 07/02/2018   Procedure: CLOSED REDUCTION MANDIBULAR;  Surgeon: Vernie Murders, MD;  Location: [facility] ORS;  Service: ENT;  Laterality: N/A;   Family History:  Family History  Problem Relation Age of Onset   Asthma Mother    Cancer Mother        breast cancer   Ulcers Mother    Cancer Father        esophogeal cancer   Hypertension Father    Asthma  Brother    Family Psychiatric  History: see above Social History:  Social History   Substance and Sexual Activity  Alcohol Use Not Currently   Alcohol/week: 13.0 standard drinks of alcohol   Types: 6 Cans of beer, 7 Standard drinks or equivalent per week   Comment: previously heavy drinker 2016. most recent 4 beer/ day drinker and none since 01-11-2020     Social History   Substance and Sexual Activity  Drug Use Yes   Types: Methamphetamines   Comment: states he uses 1 g every other day    Social History   Socioeconomic History   Marital status: Single    Spouse name: Not on file   Number of children: 1   Years of education: 10   Highest education level: 10th grade  Occupational History   Not on file  Tobacco Use   Smoking status: Every Day    Current packs/day: 0.50    Average packs/day: 0.5 packs/day for 8.0 years (4.0 ttl pk-yrs)    Types: Cigarettes   Smokeless tobacco: Never  Vaping Use   Vaping status: Every Day   Substances: Nicotine, Flavoring  Substance and Sexual Activity   Alcohol use: Not Currently    Alcohol/week: 13.0 standard drinks of alcohol    Types: 6 Cans of beer, 7 Standard drinks or equivalent per week    Comment: previously heavy drinker 2016. most recent 4 beer/ day drinker and none since 01-11-2020   Drug use: Yes    Types: Methamphetamines    Comment: states he uses 1 g every other day   Sexual activity:  Yes  Other Topics Concern   Not on file  Social History Narrative   Not on file   Social Determinants of Health   Financial Resource Strain: High Risk (05/05/2022)   Received [facility], [facility]   Overall Financial Resource Strain (CARDIA)    Difficulty of Paying Living Expenses: Very hard  Food Insecurity: Food Insecurity Present (01/18/2023)   Hunger Vital Sign    Worried About Running Out of Food in the Last Year: Sometimes true    Ran Out of Food in the Last Year: Sometimes true   Transportation Needs: No Transportation Needs (01/18/2023)   PRAPARE - Administrator, Civil Service (Medical): No    Lack of Transportation (Non-Medical): No  Physical Activity: Not on file  Stress: Not on file  Social Connections: Unknown (09/09/2021)   Received from [facility], [facility]   Social Network    Social Network: Not on file   Additional Social History:                         Sleep: Good  Appetite:  Good  Current Medications: Current Facility-Administered Medications  Medication Dose Route Frequency Provider Last Rate Last Admin   acetaminophen (TYLENOL) tablet 650 mg  650 mg Oral Q6H PRN Rankin, Shuvon B, NP   650 mg at 01/29/23 1622   albuterol (VENTOLIN HFA) 108 (90 Base) MCG/ACT inhaler 2 puff  2 puff Inhalation Q4H PRN Sarina Ill, DO   2 puff at 01/28/23 2309   alum & mag hydroxide-simeth (MAALOX/MYLANTA) 200-200-20 MG/5ML suspension 30 mL  30 mL Oral Q4H PRN Rankin, Shuvon B, NP       diphenhydrAMINE (BENADRYL) capsule 50 mg  50 mg Oral TID PRN Rankin, Shuvon B, NP       Or   diphenhydrAMINE (BENADRYL) injection 50 mg  50 mg Intramuscular TID PRN Rankin, Shuvon B, NP       feeding supplement (ENSURE ENLIVE / ENSURE PLUS) liquid 237 mL  1 Bottle Oral BID BM Myriam Forehand, NP   237 mL at 01/30/23 1428   FLUoxetine (PROZAC) capsule 20 mg  20 mg Oral Daily Sarina Ill, DO   20 mg at 01/30/23 0900   hydrocortisone cream 1 %   Topical TID PRN Rankin, Shuvon B, NP   Given at 01/30/23 1551   magnesium hydroxide (MILK OF MAGNESIA) suspension 30 mL  30 mL Oral Daily PRN Rankin, Shuvon B, NP       QUEtiapine (SEROQUEL) tablet 150 mg  150 mg Oral QHS Rankin, Shuvon B, NP   150 mg at 01/29/23 2124   risperiDONE (RISPERDAL) tablet 0.5 mg  0.5 mg Oral BH-q8a4p Sarina Ill, DO   0.5 mg at 01/30/23 1550   traZODone (DESYREL) tablet 50 mg  50 mg Oral QHS PRN Rankin, Shuvon B, NP   50 mg at 01/29/23 2124    Lab  Results: No results found for this or any previous visit (from the past 48 hour(s)).  Blood Alcohol level:  Lab Results  Component Value Date   [facility] <10 01/17/2023   ETH <10 12/29/2021    Metabolic Disorder Labs: Lab Results  Component Value Date   HGBA1C 7.2 (H) 01/17/2023   MPG 159.94 01/17/2023   MPG 126 01/17/2021   Lab Results  Component Value Date   PROLACTIN 10.1 01/17/2023   Lab Results  Component Value Date  CHOL 136 01/17/2023   TRIG 101 01/17/2023   HDL 37 (L) 01/17/2023   CHOLHDL 3.7 01/17/2023   VLDL 20 01/17/2023   LDLCALC 79 01/17/2023   LDLCALC 46 01/17/2021    Physical Findings: AIMS:  , ,  ,  ,    CIWA:  CIWA-Ar Total: 0 COWS:  COWS Total Score: 1  Musculoskeletal: Strength & Muscle Tone: within normal limits Gait & Station: normal Patient leans: N/A  Psychiatric Specialty Exam:  Presentation  General Appearance:  Appropriate for Environment  Eye Contact: Good  Speech: Clear and Coherent  Speech Volume: Normal  Handedness: Right   Mood and Affect  Mood: Anxious (about discharge)  Affect: Appropriate   Thought Process  Thought Processes: Coherent  Descriptions of Associations:Intact  Orientation:Full (Time, Place and Person)  Thought Content:WDL  History of Schizophrenia/Schizoaffective disorder:No  Duration of Psychotic Symptoms:N/A  Hallucinations:Hallucinations: None Description of Auditory Hallucinations: none noted Description of Visual Hallucinations: none noted  Ideas of Reference:None  Suicidal Thoughts:Suicidal Thoughts: No SI Active Intent and/or Plan: -- (none noted) SI Passive Intent and/or Plan: -- (none noted)  Homicidal Thoughts:Homicidal Thoughts: No   Sensorium  Memory: Immediate Good  Judgment: Good  Insight: Good   Executive Functions  Concentration: Good  Attention Span: Good  Recall: Good  Fund of Knowledge: Good  Language: Good   Psychomotor Activity   Psychomotor Activity: Psychomotor Activity: Normal   Assets  Assets: Desire for Improvement; Resilience; Communication Skills   Sleep  Sleep: Sleep: Good Number of Hours of Sleep: 8    Physical Exam: Physical Exam Constitutional:      Appearance: Normal appearance.  HENT:     Head: Normocephalic and atraumatic.     Nose: Nose normal.  Pulmonary:     Effort: Pulmonary effort is normal.  Musculoskeletal:        General: Normal range of motion.     Cervical back: Normal range of motion.  Neurological:     General: No focal deficit present.     Mental Status: He is alert and oriented to person, place, and time.  Psychiatric:        Mood and Affect: Mood normal.        Behavior: Behavior normal.        Thought Content: Thought content normal.        Judgment: Judgment normal.    ROS Blood pressure 119/67, pulse 61, temperature 97.7 F (36.5 C), resp. rate 16, height 5\' 9"  (1.753 m), weight 105.2 kg, SpO2 100%. Body mass index is 34.26 kg/m.   Treatment Plan Summary: Daily contact with patient to assess and evaluate symptoms and progress in treatment  Myriam Forehand, NP 01/30/2023, 7:10 PM

## 2023-01-30 NOTE — Group Note (Signed)
Date:  01/30/2023 Time:  6:07 PM  Group Topic/Focus:  Art therapy Board games puzzles activity    Participation Level:  Active  Participation Quality:  Appropriate  Affect:  Appropriate  Cognitive:  Alert and Appropriate  Insight: Appropriate  Engagement in Group:  Developing/Improving and Engaged  Modes of Intervention:  Activity  Additional Comments:    Isabella Roemmich 01/30/2023, 6:07 PM

## 2023-01-30 NOTE — Progress Notes (Signed)
Patient had no complaints of any withdrawal symptoms at this time.    01/30/23 0800  Clinical Opiate Withdrawal Scale (COWS)  Resting Pulse Rate 0  Sweating 0  Restlessness 0  Pupil Size 0  Bone or Joint Aches 0  Runny Nose or Tearing 0  GI Upset 0  Tremor 0  Yawning 0  Anxiety or Irritability 0  Gooseflesh Skin 0  COWS Total Score 0

## 2023-01-30 NOTE — Progress Notes (Signed)
   01/30/23 1553  Clinical Opiate Withdrawal Scale (COWS)  Resting Pulse Rate 0  Sweating 0  Restlessness 0  Pupil Size 0  Bone or Joint Aches 0  Runny Nose or Tearing 0  GI Upset 0  Tremor 0  Yawning 1  Anxiety or Irritability 0  Gooseflesh Skin 0  COWS Total Score 1

## 2023-01-30 NOTE — Group Note (Signed)
Date:  01/30/2023 Time:  9:18 PM  Group Topic/Focus:  Wrap-Up Group:   The focus of this group is to help patients review their daily goal of treatment and discuss progress on daily workbooks.    Participation Level:  Active  Participation Quality:  Appropriate, Attentive, Sharing, and Supportive  Affect:  Appropriate  Cognitive:  Appropriate  Insight: Appropriate and Good  Engagement in Group:  Supportive  Modes of Intervention:  Support  Additional Comments:     Belva Crome 01/30/2023, 9:18 PM

## 2023-01-30 NOTE — Group Note (Signed)
Date:  01/30/2023 Time:  11:23 AM  Group Topic/Focus:  Activity Group:  The focus of the group is to encourage patients to come out in the courtyard to get some fresh air and some exercise.    Participation Level:  Did Not Attend   Nathan Koch 01/30/2023, 11:23 AM

## 2023-01-31 NOTE — Progress Notes (Signed)
[facility] MD Progress Note  01/31/2023 1:00 PM Nathan Koch  MRN:  [id number] Subjective:   32 year old multiracial male who reports that he is awaiting transfer to inpatient rehabilitation, with social work following up on the process. He states that he is tolerating his medications well and has no complaints. The patient denies suicidal ideation (SI), homicidal ideation (HI), psychotic symptoms, or delusional thoughts.The patient is stable and compliant with his medication regimen, reporting no side effects or psychiatric symptoms. His mood appears well-regulated, and there are no acute safety concerns. The patient is currently awaiting transfer to an inpatient rehabilitation facility, and social work is managing the transition. He continues to engage positively in his treatment Principal Problem: Other psychoactive substance-induced mood disorder ([name]) Diagnosis: Principal Problem:   Other psychoactive substance-induced mood disorder ([name])  Total Time spent with patient: 45 minutes  Past Psychiatric History: Anxiety, Polysubstance Abuse  Past Medical History:  Past Medical History:  Diagnosis Date   Anxiety    Anxiety disorder    Asthma    Bipolar 1 disorder ([name])    Depression    Methamphetamine abuse ([name])    PTSD (post-traumatic stress disorder)     Past Surgical History:  Procedure Laterality Date   CLOSED REDUCTION MANDIBLE N/A 07/02/2018   Procedure: CLOSED REDUCTION MANDIBULAR;  Surgeon: Vernie Murders, MD;  Location: [facility] ORS;  Service: ENT;  Laterality: N/A;   Family History:  Family History  Problem Relation Age of Onset   Asthma Mother    Cancer Mother        breast cancer   Ulcers Mother    Cancer Father        esophogeal cancer   Hypertension Father    Asthma Brother    Family Psychiatric  History: see above Social History:  Social History   Substance and Sexual Activity  Alcohol Use Not Currently   Alcohol/week: 13.0 standard drinks of alcohol   Types: 6 Cans  of beer, 7 Standard drinks or equivalent per week   Comment: previously heavy drinker 2016. most recent 4 beer/ day drinker and none since 01-11-2020     Social History   Substance and Sexual Activity  Drug Use Yes   Types: Methamphetamines   Comment: states he uses 1 g every other day    Social History   Socioeconomic History   Marital status: Single    Spouse name: Not on file   Number of children: 1   Years of education: 10   Highest education level: 10th grade  Occupational History   Not on file  Tobacco Use   Smoking status: Every Day    Current packs/day: 0.50    Average packs/day: 0.5 packs/day for 8.0 years (4.0 ttl pk-yrs)    Types: Cigarettes   Smokeless tobacco: Never  Vaping Use   Vaping status: Every Day   Substances: Nicotine, Flavoring  Substance and Sexual Activity   Alcohol use: Not Currently    Alcohol/week: 13.0 standard drinks of alcohol    Types: 6 Cans of beer, 7 Standard drinks or equivalent per week    Comment: previously heavy drinker 2016. most recent 4 beer/ day drinker and none since 01-11-2020   Drug use: Yes    Types: Methamphetamines    Comment: states he uses 1 g every other day   Sexual activity: Yes  Other Topics Concern   Not on file  Social History Narrative   Not on file   Social Determinants of Health  Financial Resource Strain: High Risk (05/05/2022)   Received from [facility], [facility]   Overall Financial Resource Strain (CARDIA)    Difficulty of Paying Living Expenses: Very hard  Food Insecurity: Food Insecurity Present (01/18/2023)   Hunger Vital Sign    Worried About Running Out of Food in the Last Year: Sometimes true    Ran Out of Food in the Last Year: Sometimes true  Transportation Needs: No Transportation Needs (01/18/2023)   PRAPARE - Administrator, Civil Service (Medical): No    Lack of Transportation (Non-Medical): No  Physical Activity: Not on file  Stress:  Not on file  Social Connections: Unknown (09/09/2021)   Received from [facility], [facility]   Social Network    Social Network: Not on file   Additional Social History:                         Sleep: Negative  Appetite:  Negative  Current Medications: Current Facility-Administered Medications  Medication Dose Route Frequency Provider Last Rate Last Admin   acetaminophen (TYLENOL) tablet 650 mg  650 mg Oral Q6H PRN Rankin, Shuvon B, NP   650 mg at 01/29/23 1622   albuterol (VENTOLIN HFA) 108 (90 Base) MCG/ACT inhaler 2 puff  2 puff Inhalation Q4H PRN Sarina Ill, DO   2 puff at 01/28/23 2309   alum & mag hydroxide-simeth (MAALOX/MYLANTA) 200-200-20 MG/5ML suspension 30 mL  30 mL Oral Q4H PRN Rankin, Shuvon B, NP       diphenhydrAMINE (BENADRYL) capsule 50 mg  50 mg Oral TID PRN Rankin, Shuvon B, NP       Or   diphenhydrAMINE (BENADRYL) injection 50 mg  50 mg Intramuscular TID PRN Rankin, Shuvon B, NP       feeding supplement (ENSURE ENLIVE / ENSURE PLUS) liquid 237 mL  1 Bottle Oral BID BM Myriam Forehand, NP   237 mL at 01/31/23 1429   FLUoxetine (PROZAC) capsule 20 mg  20 mg Oral Daily Sarina Ill, DO   20 mg at 01/31/23 0830   hydrocortisone cream 1 %   Topical TID PRN Rankin, Shuvon B, NP   Given at 01/30/23 1551   magnesium hydroxide (MILK OF MAGNESIA) suspension 30 mL  30 mL Oral Daily PRN Rankin, Shuvon B, NP       QUEtiapine (SEROQUEL) tablet 150 mg  150 mg Oral QHS Rankin, Shuvon B, NP   150 mg at 01/30/23 2124   risperiDONE (RISPERDAL) tablet 0.5 mg  0.5 mg Oral BH-q8a4p Sarina Ill, DO   0.5 mg at 01/31/23 1734   traZODone (DESYREL) tablet 50 mg  50 mg Oral QHS PRN Rankin, Shuvon B, NP   50 mg at 01/30/23 2124    Lab Results: No results found for this or any previous visit (from the past 48 hour(s)).  Blood Alcohol level:  Lab Results  Component Value Date   ETH <10 01/17/2023   ETH <10 12/29/2021    Metabolic  Disorder Labs: Lab Results  Component Value Date   HGBA1C 7.2 (H) 01/17/2023   MPG 159.94 01/17/2023   MPG 126 01/17/2021   Lab Results  Component Value Date   PROLACTIN 10.1 01/17/2023   Lab Results  Component Value Date   CHOL 136 01/17/2023   TRIG 101 01/17/2023   HDL 37 (L) 01/17/2023   CHOLHDL 3.7 01/17/2023   VLDL 20 01/17/2023  LDLCALC 79 01/17/2023   LDLCALC 46 01/17/2021    Physical Findings: AIMS:  , ,  ,  ,    CIWA:  CIWA-Ar Total: 0 COWS:  COWS Total Score: 0  Musculoskeletal: Strength & Muscle Tone: within normal limits Gait & Station: normal Patient leans: N/A  Psychiatric Specialty Exam:  Presentation  General Appearance:  Appropriate for Environment  Eye Contact: Good  Speech: Clear and Coherent  Speech Volume: Normal  Handedness: Right   Mood and Affect  Mood: Anxious (about discharge)  Affect: Appropriate   Thought Process  Thought Processes: Coherent  Descriptions of Associations:Intact  Orientation:Full (Time, Place and Person)  Thought Content:WDL  History of Schizophrenia/Schizoaffective disorder:No  Duration of Psychotic Symptoms:N/A  Hallucinations:Hallucinations: None Description of Auditory Hallucinations: none reported Description of Visual Hallucinations: none reported  Ideas of Reference:None  Suicidal Thoughts:Suicidal Thoughts: No SI Active Intent and/or Plan: -- (none noted) SI Passive Intent and/or Plan: -- (none noted)  Homicidal Thoughts:No data recorded  Sensorium  Memory: Immediate Good; Remote Good  Judgment: Good  Insight: Good   Executive Functions  Concentration: Good  Attention Span: Good  Recall: Good  Fund of Knowledge: Good  Language: Good   Psychomotor Activity  Psychomotor Activity: Psychomotor Activity: Normal   Assets  Assets: Communication Skills; Desire for Improvement; Resilience; Physical Health   Sleep  Sleep: Sleep: Good Number of Hours  of Sleep: 8    Physical Exam: Physical Exam Vitals and nursing note reviewed.  HENT:     Head: Normocephalic.     Nose: Nose normal.  Pulmonary:     Effort: Pulmonary effort is normal.  Musculoskeletal:        General: Normal range of motion.     Cervical back: Normal range of motion.  Neurological:     General: No focal deficit present.     Mental Status: He is alert and oriented to person, place, and time.  Psychiatric:        Mood and Affect: Mood normal.        Behavior: Behavior normal.        Thought Content: Thought content normal.        Judgment: Judgment normal.    Review of Systems  All other systems reviewed and are negative.  Blood pressure 114/72, pulse 67, temperature (!) 97.3 F (36.3 C), resp. rate 18, height 5\' 9"  (1.753 m), weight 105.2 kg, SpO2 98%. Body mass index is 34.26 kg/m.   Treatment Plan Summary: 1.Continue the current medication regimen as the patient is tolerating it well with no side effects or concerns. 2.Monitor for any changes in mood, behavior, or adverse reactions as the patient transitions to rehabilitation. 3.Encourage the patient to remain engaged in therapeutic activities while awaiting transfer to inpatient rehabilitation. 4.Provide additional emotional support as needed during the transition phase. 5.Social work will continue managing the process for the patient's transfer to inpatient rehabilitation. 6.Ensure that the patient has a smooth transition by coordinating with the receiving facility and ensuring all necessary documentation is in place.   Myriam Forehand, NP 01/31/2023, 7:00 PM

## 2023-01-31 NOTE — Group Note (Signed)
Date:  01/31/2023 Time:  7:04 PM  Group Topic/Focus:  Outdoor recreation and music therapy.     Participation Level:  Active  Participation Quality:  Appropriate  Affect:  Appropriate  Cognitive:  Alert and Appropriate  Insight: Appropriate  Engagement in Group:  Developing/Improving  Modes of Intervention:  Activity  Additional Comments:    Saxon Crosby 01/31/2023, 7:04 PM

## 2023-01-31 NOTE — Progress Notes (Signed)
Patient admitted voluntarily from [facility] on Sept 232, 2024 for SI with plan to walk into traffic. Reports of depression and PTSD, and substance abuse   Patient denies SI/HI/AVH. He denies anxiety and depression. Patient voiced no concerns.  He is expected to discharge tomorrow 10.07.24.  Q15 minute unit checks in place.

## 2023-01-31 NOTE — Group Note (Signed)
Date:  01/31/2023 Time:  6:54 PM  Group Topic/Focus:  Anger management structured group. A psycho-therapeutic program for anger prevention and control. It has been described as deploying anger successfully. Anger is frequently a result of frustration, or of feeling blocked or thwarted from something the subject feels is important . Outdoor Recreation Structured Activity.    Participation Level:  Did Not Attend   Rosaura Carpenter 01/31/2023, 6:54 PM

## 2023-01-31 NOTE — Plan of Care (Signed)
  Problem: Education: Goal: Knowledge of General Education information will improve Description Including pain rating scale, medication(s)/side effects and non-pharmacologic comfort measures Outcome: Adequate for Discharge   Problem: Health Behavior/Discharge Planning: Goal: Ability to manage health-related needs will improve Outcome: Adequate for Discharge   

## 2023-01-31 NOTE — Plan of Care (Signed)
  Problem: Education: Goal: Knowledge of General Education information will improve Description: Including pain rating scale, medication(s)/side effects and non-pharmacologic comfort measures Outcome: Progressing   Problem: Health Behavior/Discharge Planning: Goal: Ability to manage health-related needs will improve Outcome: Progressing   Problem: Clinical Measurements: Goal: Ability to maintain clinical measurements within normal limits will improve Outcome: Progressing Goal: Will remain free from infection Outcome: Progressing Goal: Diagnostic test results will improve Outcome: Progressing Goal: Respiratory complications will improve Outcome: Progressing Goal: Cardiovascular complication will be avoided Outcome: Progressing   Problem: Activity: Goal: Risk for activity intolerance will decrease Outcome: Progressing   Problem: Nutrition: Goal: Adequate nutrition will be maintained Outcome: Progressing   Problem: Coping: Goal: Level of anxiety will decrease Outcome: Progressing   Problem: Elimination: Goal: Will not experience complications related to bowel motility Outcome: Progressing Goal: Will not experience complications related to urinary retention Outcome: Progressing   Problem: Pain Managment: Goal: General experience of comfort will improve Outcome: Progressing   Problem: Safety: Goal: Ability to remain free from injury will improve Outcome: Progressing   Problem: Skin Integrity: Goal: Risk for impaired skin integrity will decrease Outcome: Progressing   Problem: Education: Goal: Knowledge of [place] [facility] Education information/materials will improve Outcome: Progressing Goal: Emotional status will improve Outcome: Progressing Goal: Mental status will improve Outcome: Progressing Goal: Verbalization of understanding the information provided will improve Outcome: Progressing   Problem: Activity: Goal: Interest or engagement in activities will  improve Outcome: Progressing Goal: Sleeping patterns will improve Outcome: Progressing   Problem: Coping: Goal: Ability to verbalize frustrations and anger appropriately will improve Outcome: Progressing Goal: Ability to demonstrate self-control will improve Outcome: Progressing   Problem: Health Behavior/Discharge Planning: Goal: Identification of resources available to assist in meeting health care needs will improve Outcome: Progressing Goal: Compliance with treatment plan for underlying cause of condition will improve Outcome: Progressing   Problem: Physical Regulation: Goal: Ability to maintain clinical measurements within normal limits will improve Outcome: Progressing   Problem: Safety: Goal: Periods of time without injury will increase Outcome: Progressing   Problem: Education: Goal: Knowledge of [place] [facility] Education information/materials will improve Outcome: Progressing Goal: Emotional status will improve Outcome: Progressing Goal: Mental status will improve Outcome: Progressing Goal: Verbalization of understanding the information provided will improve Outcome: Progressing   Problem: Coping: Goal: Ability to verbalize frustrations and anger appropriately will improve Outcome: Progressing Goal: Ability to demonstrate self-control will improve Outcome: Progressing   Problem: Health Behavior/Discharge Planning: Goal: Identification of resources available to assist in meeting health care needs will improve Outcome: Progressing Goal: Compliance with treatment plan for underlying cause of condition will improve Outcome: Progressing   Problem: Physical Regulation: Goal: Ability to maintain clinical measurements within normal limits will improve Outcome: Progressing   Problem: Safety: Goal: Periods of time without injury will increase Outcome: Progressing

## 2023-01-31 NOTE — Group Note (Signed)
Date:  01/31/2023 Time:  6:46 PM  Group Topic/Focus:  Stages of Change:   The focus of this group is to explain the stages of change and help patients identify changes they want to make upon discharge.    Participation Level:  Did Not Attend   Nathan Koch 01/31/2023, 6:46 PM

## 2023-01-31 NOTE — Group Note (Signed)
Date:  01/31/2023 Time:  9:23 PM  Group Topic/Focus:  Wrap-Up Group:   The focus of this group is to help patients review their daily goal of treatment and discuss progress on daily workbooks.    Participation Level:  Active  Participation Quality:  Appropriate, Attentive, Sharing, and Supportive  Affect:  Appropriate  Cognitive:  Appropriate  Insight: Appropriate and Good  Engagement in Group:  Supportive  Modes of Intervention:  Support  Additional Comments:     Belva Crome 01/31/2023, 9:23 PM

## 2023-02-01 ENCOUNTER — Other Ambulatory Visit: Payer: Self-pay

## 2023-02-01 MED ORDER — RISPERIDONE 0.5 MG PO TABS
0.5000 mg | ORAL_TABLET | ORAL | 1 refills | Status: DC
  Filled 2023-02-01: qty 60, 30d supply, fill #0

## 2023-02-01 MED ORDER — RISPERIDONE 0.5 MG PO TABS: 0.5000 mg | ORAL_TABLET | ORAL | 1 refills | Status: DC

## 2023-02-01 MED ORDER — QUETIAPINE FUMARATE 50 MG PO TABS: 150.0000 mg | ORAL_TABLET | Freq: Every day | ORAL | 1 refills | Status: DC

## 2023-02-01 MED ORDER — VENTOLIN HFA 108 (90 BASE) MCG/ACT IN AERS: 2.0000 | INHALATION_SPRAY | Freq: Four times a day (QID) | RESPIRATORY_TRACT | 0 refills | Status: DC | PRN

## 2023-02-01 MED ORDER — TRAZODONE HCL 50 MG PO TABS: 50.0000 mg | ORAL_TABLET | Freq: Every evening | ORAL | 1 refills | Status: DC | PRN

## 2023-02-01 MED ORDER — QUETIAPINE FUMARATE 50 MG PO TABS
150.0000 mg | ORAL_TABLET | Freq: Every day | ORAL | 1 refills | Status: DC
  Filled 2023-02-01: qty 75, 25d supply, fill #0

## 2023-02-01 MED ORDER — ALBUTEROL SULFATE HFA 108 (90 BASE) MCG/ACT IN AERS
2.0000 | INHALATION_SPRAY | RESPIRATORY_TRACT | 1 refills | Status: DC | PRN
  Filled 2023-02-01: qty 18, 17d supply, fill #0

## 2023-02-01 MED ORDER — FLUOXETINE HCL 20 MG PO CAPS: 20.0000 mg | ORAL_CAPSULE | Freq: Every day | ORAL | 1 refills | Status: DC

## 2023-02-01 MED ORDER — TRAZODONE HCL 50 MG PO TABS
50.0000 mg | ORAL_TABLET | Freq: Every evening | ORAL | 1 refills | Status: DC | PRN
  Filled 2023-02-01: qty 30, 30d supply, fill #0

## 2023-02-01 MED ORDER — HYDROCORTISONE 1 % EX CREA
TOPICAL_CREAM | Freq: Three times a day (TID) | CUTANEOUS | 1 refills | Status: DC | PRN
  Filled 2023-02-01: qty 28, 10d supply, fill #0

## 2023-02-01 MED ORDER — FLUOXETINE HCL 20 MG PO CAPS
20.0000 mg | ORAL_CAPSULE | Freq: Every day | ORAL | 1 refills | Status: DC
  Filled 2023-02-01: qty 30, 30d supply, fill #0

## 2023-02-01 NOTE — Group Note (Signed)
Date:  02/01/2023 Time:  9:18 PM  Group Topic/Focus:  Personal Choices and Values:   The focus of this group is to help patients assess and explore the importance of values in their lives, how their values affect their decisions, how they express their values and what opposes their expression. Wrap-Up Group:   The focus of this group is to help patients review their daily goal of treatment and discuss progress on daily workbooks.    Participation Level:  Active  Participation Quality:  Appropriate and Attentive  Affect:  Appropriate  Cognitive:  Alert and Appropriate  Insight: Appropriate and Good  Engagement in Group:  Engaged  Modes of Intervention:  Discussion and Support  Additional Comments:     Maglione,Nathan Koch 02/01/2023, 9:18 PM

## 2023-02-01 NOTE — Group Note (Signed)
Date:  02/01/2023 Time:  10:18 AM  Group Topic/Focus:  Goals Group:   The focus of this group is to help patients establish daily goals to achieve during treatment and discuss how the patient can incorporate goal setting into their daily lives to aide in recovery.    Participation Level:  Did Not Attend   Lynelle [facility] 02/01/2023, 10:18 AM

## 2023-02-01 NOTE — Group Note (Signed)
Date:  02/01/2023 Time:  3:58 PM  Group Topic/Focus:  Activity Group:  The focus of the group is to encourage activity outside in the courtyard for the patients to get some fresh air and encourage exercise for the benefit of their mental health treatment.    Participation Level:  Active  Participation Quality:  Appropriate  Affect:  Appropriate  Cognitive:  Appropriate  Insight: Appropriate  Engagement in Group:  Engaged  Modes of Intervention:  Activity  Additional Comments:    [name] 02/01/2023, 3:58 PM

## 2023-02-01 NOTE — BHH Counselor (Signed)
CSW spoke with Grover Canavan, nurse at [facility].  CSW explained that medications were a barrier to discharge today.  CSW was informed that patient CAN go 02/02/2023.  Patient HAS to arrive by 2PM.     Penni Homans, MSW, LCSW 02/01/2023 4:31 PM

## 2023-02-01 NOTE — Progress Notes (Signed)
Patient denies SI, HI, and AVH. He denies pain and reports having a BM yesterday. He does not report any medication side effects. Patient states he feels ready for discharge. He is compliant with scheduled medications.

## 2023-02-01 NOTE — Plan of Care (Signed)
  Problem: Education: Goal: Knowledge of General Education information will improve Description: Including pain rating scale, medication(s)/side effects and non-pharmacologic comfort measures Outcome: Progressing   Problem: Health Behavior/Discharge Planning: Goal: Ability to manage health-related needs will improve Outcome: Progressing   Problem: Clinical Measurements: Goal: Ability to maintain clinical measurements within normal limits will improve Outcome: Progressing Goal: Will remain free from infection Outcome: Progressing Goal: Diagnostic test results will improve Outcome: Progressing Goal: Respiratory complications will improve Outcome: Progressing Goal: Cardiovascular complication will be avoided Outcome: Progressing   Problem: Activity: Goal: Risk for activity intolerance will decrease Outcome: Progressing   Problem: Nutrition: Goal: Adequate nutrition will be maintained Outcome: Progressing   Problem: Coping: Goal: Level of anxiety will decrease Outcome: Progressing   Problem: Elimination: Goal: Will not experience complications related to bowel motility Outcome: Progressing Goal: Will not experience complications related to urinary retention Outcome: Progressing   Problem: Pain Managment: Goal: General experience of comfort will improve Outcome: Progressing   Problem: Safety: Goal: Ability to remain free from injury will improve Outcome: Progressing   Problem: Skin Integrity: Goal: Risk for impaired skin integrity will decrease Outcome: Progressing   Problem: Education: Goal: Knowledge of [place] [facility] Education information/materials will improve Outcome: Progressing Goal: Emotional status will improve Outcome: Progressing Goal: Mental status will improve Outcome: Progressing Goal: Verbalization of understanding the information provided will improve Outcome: Progressing   Problem: Activity: Goal: Interest or engagement in activities will  improve Outcome: Progressing Goal: Sleeping patterns will improve Outcome: Progressing   Problem: Coping: Goal: Ability to verbalize frustrations and anger appropriately will improve Outcome: Progressing Goal: Ability to demonstrate self-control will improve Outcome: Progressing   Problem: Health Behavior/Discharge Planning: Goal: Identification of resources available to assist in meeting health care needs will improve Outcome: Progressing Goal: Compliance with treatment plan for underlying cause of condition will improve Outcome: Progressing   Problem: Physical Regulation: Goal: Ability to maintain clinical measurements within normal limits will improve Outcome: Progressing   Problem: Safety: Goal: Periods of time without injury will increase Outcome: Progressing   Problem: Education: Goal: Knowledge of [place] [facility] Education information/materials will improve Outcome: Progressing Goal: Emotional status will improve Outcome: Progressing Goal: Mental status will improve Outcome: Progressing Goal: Verbalization of understanding the information provided will improve Outcome: Progressing   Problem: Coping: Goal: Ability to verbalize frustrations and anger appropriately will improve Outcome: Progressing Goal: Ability to demonstrate self-control will improve Outcome: Progressing   Problem: Health Behavior/Discharge Planning: Goal: Identification of resources available to assist in meeting health care needs will improve Outcome: Progressing Goal: Compliance with treatment plan for underlying cause of condition will improve Outcome: Progressing   Problem: Physical Regulation: Goal: Ability to maintain clinical measurements within normal limits will improve Outcome: Progressing   Problem: Safety: Goal: Periods of time without injury will increase Outcome: Progressing

## 2023-02-01 NOTE — BHH Suicide Risk Assessment (Signed)
[facility] Discharge Suicide Risk Assessment   Principal Problem: Other psychoactive substance-induced mood disorder ([name]) Discharge Diagnoses: Principal Problem:   Other psychoactive substance-induced mood disorder ([name])   Total Time spent with patient: 2 hours  Musculoskeletal: Strength & Muscle Tone: within normal limits Gait & Station: normal Patient leans: N/A  Psychiatric Specialty Exam  Presentation  General Appearance:  Fairly Groomed  Eye Contact: Good  Speech: Clear and Coherent  Speech Volume: Normal  Handedness: Right   Mood and Affect  Mood: Anxious (about discharge)  Duration of Depression Symptoms: Greater than two weeks  Affect: Appropriate   Thought Process  Thought Processes: Coherent  Descriptions of Associations:Intact  Orientation:Full (Time, Place and Person) (and situation)  Thought Content:WDL  History of Schizophrenia/Schizoaffective disorder:No  Duration of Psychotic Symptoms:N/A  Hallucinations:Hallucinations: None Description of Auditory Hallucinations: none noted Description of Visual Hallucinations: none noted  Ideas of Reference:None  Suicidal Thoughts:Suicidal Thoughts: No SI Active Intent and/or Plan: -- (none noted) SI Passive Intent and/or Plan: -- (none noted)  Homicidal Thoughts:Homicidal Thoughts: No   Sensorium  Memory: Immediate Good; Remote Good  Judgment: Good  Insight: Good   Executive Functions  Concentration: Good  Attention Span: Good  Recall: Good  Fund of Knowledge: Good  Language: Good   Psychomotor Activity  Psychomotor Activity: Psychomotor Activity: Normal   Assets  Assets: Financial Resources/Insurance; Desire for Improvement; Communication Skills; Resilience; Physical Health   Sleep  Sleep: Sleep: Good Number of Hours of Sleep: 8   Physical Exam: Physical Exam ROS Blood pressure 109/82, pulse 82, temperature 97.9 F (36.6 C), temperature source Oral,  resp. rate 18, height 5\' 9"  (1.753 m), weight 105.2 kg, SpO2 97%. Body mass index is 34.26 kg/m.  Mental Status Per Nursing Assessment::   On Admission:  Suicidal ideation indicated by patient  Demographic Factors:  Male, Adolescent or young adult, Low socioeconomic status, and Unemployed  Loss Factors: Financial problems/change in socioeconomic status  Historical Factors: Impulsivity  Risk Reduction Factors:   Positive social support, Positive therapeutic relationship, and Positive coping skills or problem solving skills  Continued Clinical Symptoms:  Depression:   Impulsivity Alcohol/Substance Abuse/Dependencies Previous Psychiatric Diagnoses and Treatments  Cognitive Features That Contribute To Risk:  None    Suicide Risk:  Minimal: No identifiable suicidal ideation.  Patients presenting with no risk factors but with morbid ruminations; may be classified as minimal risk based on the severity of the depressive symptoms   Follow-up Information     [facility] Follow up.   Specialty: Addiction Medicine Why: You have been accepted to [facility]. Contact information: [address] [place] [address] [phone]                 Plan Of Care/Follow-up recommendations:  Activity:  as tolerated Diet:  heart healthy 1.The patient is stable for discharge, with no ongoing suicidal ideation. He will continue his treatment at [facility] and follow up as needed. 2.The patient will transition to [facility] for continued substance use treatment. 3. LCSW has confirmed the patient's appointment at [facility] on 10/7 at 1 PM and will assist with transportation needs. 4.The patient reports the ability to pay for his medications through [facility] Tailored Plan and will continue to comply with his prescribed regimen. 5.The patient plans to retrieve his belongindgs from [facility] transition to [facility] for continued treatment. 4. FLUoxetine (PROZAC) 20 MG capsule 30  days with 1 refill 5. QUEtiapine 150 MG TABS 30 days x 1 refill 6.risperiDONE (  RISPERDAL) 0.5 MG tablet  30 days with 1 refill 7.traZODone (DESYREL) 50 MG tablet 30 days with 1 refill 8. VENTOLIN HFA 108 (90 Base) MCG/ACT inhaler 30 days with 1 refill   Myriam Forehand, NP 02/01/2023, 11:49 AM

## 2023-02-01 NOTE — Discharge Summary (Signed)
Physician Discharge Summary Note  Patient:  Nathan Koch is an 32 y.o., male MRN:  [id number] DOB:  1991/01/19 Patient phone:  [phone] (home)  Patient address:   [address] [place] [address],  Total Time spent with patient: 2.5 hours  Date of Admission:  01/18/2023 Date of Discharge: 02/01/2023  Reason for Admission:  32 year old Caucasian male voluntarily admitted to inpatient psychiatry for polysubstance use, suicidal ideation, and depression  Principal Problem: Other psychoactive substance-induced mood disorder ([name]) Discharge Diagnoses: Principal Problem:   Other psychoactive substance-induced mood disorder ([name])   Past Psychiatric History: Anxiety, Polysubstance Abuse, Depression  Past Medical History:  Past Medical History:  Diagnosis Date   Anxiety    Anxiety disorder    Asthma    Bipolar 1 disorder ([name])    Depression    Methamphetamine abuse ([name])    PTSD (post-traumatic stress disorder)     Past Surgical History:  Procedure Laterality Date   CLOSED REDUCTION MANDIBLE N/A 07/02/2018   Procedure: CLOSED REDUCTION MANDIBULAR;  Surgeon: Vernie Murders, MD;  Location: [facility] ORS;  Service: ENT;  Laterality: N/A;   Family History:  Family History  Problem Relation Age of Onset   Asthma Mother    Cancer Mother        breast cancer   Ulcers Mother    Cancer Father        esophogeal cancer   Hypertension Father    Asthma Brother    Family Psychiatric  History: see above  Social History:  Social History   Substance and Sexual Activity  Alcohol Use Not Currently   Alcohol/week: 13.0 standard drinks of alcohol   Types: 6 Cans of beer, 7 Standard drinks or equivalent per week   Comment: previously heavy drinker 2016. most recent 4 beer/ day drinker and none since 01-11-2020     Social History   Substance and Sexual Activity  Drug Use Yes   Types: Methamphetamines   Comment: states he uses 1 g every other day    Social History    Socioeconomic History   Marital status: Single    Spouse name: Not on file   Number of children: 1   Years of education: 10   Highest education level: 10th grade  Occupational History   Not on file  Tobacco Use   Smoking status: Every Day    Current packs/day: 0.50    Average packs/day: 0.5 packs/day for 8.0 years (4.0 ttl pk-yrs)    Types: Cigarettes   Smokeless tobacco: Never  Vaping Use   Vaping status: Every Day   Substances: Nicotine, Flavoring  Substance and Sexual Activity   Alcohol use: Not Currently    Alcohol/week: 13.0 standard drinks of alcohol    Types: 6 Cans of beer, 7 Standard drinks or equivalent per week    Comment: previously heavy drinker 2016. most recent 4 beer/ day drinker and none since 01-11-2020   Drug use: Yes    Types: Methamphetamines    Comment: states he uses 1 g every other day   Sexual activity: Yes  Other Topics Concern   Not on file  Social History Narrative   Not on file   Social Determinants of Health   Financial Resource Strain: High Risk (05/05/2022)   [facility], [facility]   Overall Financial Resource Strain (CARDIA)    Difficulty of Paying Living Expenses: Very hard  Food Insecurity: Food Insecurity Present (01/18/2023)   Hunger  Vital Sign    Worried About Programme researcher, broadcasting/film/video in the Last Year: Sometimes true    Ran Out of Food in the Last Year: Sometimes true  Transportation Needs: No Transportation Needs (01/18/2023)   PRAPARE - Administrator, Civil Service (Medical): No    Lack of Transportation (Non-Medical): No  Physical Activity: Not on file  Stress: Not on file  Social Connections: Unknown (09/09/2021)   [facility]   Social Network    Social Network: Not on file    Hospital Course:  he patient is tolerating his medications without any problems, as confirmed by nursing staff. He has been compliant with his medications, and  there are no side effects reported. The patient denies current suicidal ideation (SI), homicidal ideation (HI), hallucinations, or delusions. The patient has confirmed an appointment with [facility] on 10/7 at 1 PM for continued treatment and plans to collect his belongings from [facility], where his sponsor will take him to [facility].   Musculoskeletal: Strength & Muscle Tone: within normal limits Gait & Station: normal Patient leans: N/A   Psychiatric Specialty Exam:  Presentation  General Appearance:  Fairly Groomed  Eye Contact: Good  Speech: Clear and Coherent  Speech Volume: Normal  Handedness: Right   Mood and Affect  Mood: Anxious (about discharge)  Affect: Appropriate   Thought Process  Thought Processes: Coherent  Descriptions of Associations:Intact  Orientation:Full (Time, Place and Person) (and situation)  Thought Content:WDL  History of Schizophrenia/Schizoaffective disorder:No  Duration of Psychotic Symptoms:N/A  Hallucinations:Hallucinations: None Description of Auditory Hallucinations: none noted Description of Visual Hallucinations: none noted  Ideas of Reference:None  Suicidal Thoughts:Suicidal Thoughts: No SI Active Intent and/or Plan: -- (none noted) SI Passive Intent and/or Plan: -- (none noted)  Homicidal Thoughts:Homicidal Thoughts: No   Sensorium  Memory: Immediate Good; Remote Good  Judgment: Good  Insight: Good   Executive Functions  Concentration: Good  Attention Span: Good  Recall: Good  Fund of Knowledge: Good  Language: Good   Psychomotor Activity  Psychomotor Activity: Psychomotor Activity: Normal   Assets  Assets: Financial Resources/Insurance; Desire for Improvement; Communication Skills; Resilience; Physical Health   Sleep  Sleep: Sleep: Good Number of Hours of Sleep: 8    Physical Exam: Physical Exam Vitals and nursing note reviewed.  Constitutional:      Appearance: Normal  appearance.  HENT:     Head: Normocephalic and atraumatic.     Nose: Nose normal.  Pulmonary:     Effort: Pulmonary effort is normal.  Musculoskeletal:        General: Normal range of motion.  Neurological:     General: No focal deficit present.     Mental Status: He is alert and oriented to person, place, and time.  Psychiatric:        Mood and Affect: Mood normal.        Behavior: Behavior normal.        Thought Content: Thought content normal.        Judgment: Judgment normal.    Review of Systems  All other systems reviewed and are negative.  Blood pressure 109/82, pulse 82, temperature 97.9 F (36.6 C), temperature source Oral, resp. rate 18, height 5\' 9"  (1.753 m), weight 105.2 kg, SpO2 97%. Body mass index is 34.26 kg/m.   Social History   Tobacco Use  Smoking Status Every Day   Current packs/day: 0.50   Average packs/day: 0.5 packs/day for 8.0 years (4.0  ttl pk-yrs)   Types: Cigarettes  Smokeless Tobacco Never   Tobacco Cessation:  Prescription not provided because: Patient did not want it   Blood Alcohol level:  Lab Results  Component Value Date   ETH <10 01/17/2023   ETH <10 12/29/2021    Metabolic Disorder Labs:  Lab Results  Component Value Date   HGBA1C 7.2 (H) 01/17/2023   MPG 159.94 01/17/2023   MPG 126 01/17/2021   Lab Results  Component Value Date   PROLACTIN 10.1 01/17/2023   Lab Results  Component Value Date   CHOL 136 01/17/2023   TRIG 101 01/17/2023   HDL 37 (L) 01/17/2023   CHOLHDL 3.7 01/17/2023   VLDL 20 01/17/2023   LDLCALC 79 01/17/2023   LDLCALC 46 01/17/2021    See Psychiatric Specialty Exam and Suicide Risk Assessment completed by Attending Physician prior to discharge.  Discharge destination:  [facility]  Is patient on multiple antipsychotic therapies at discharge:  No   Has Patient had three or more failed trials of antipsychotic monotherapy by history:  No  Recommended Plan for Multiple Antipsychotic  Therapies: NA   Allergies as of 02/01/2023       Reactions   Latex Hives        Medication List     STOP taking these medications    albuterol 108 (90 Base) MCG/ACT inhaler Commonly known as: VENTOLIN HFA Replaced by: Ventolin HFA 108 (90 Base) MCG/ACT inhaler   gabapentin 400 MG capsule Commonly known as: NEURONTIN   lamoTRIgine 25 MG tablet Commonly known as: LAMICTAL   metFORMIN 500 MG 24 hr tablet Commonly known as: GLUCOPHAGE-XR       TAKE these medications      Indication  FLUoxetine 20 MG capsule Commonly known as: PROZAC Take 1 capsule (20 mg total) by mouth daily. Start taking on: February 02, 2023  Indication: Depressive Phase of Manic-Depression   hydrocortisone cream 1 % Apply topically 3 (three) times daily as needed for itching. As needed  Indication: Itching   hydrOXYzine 50 MG capsule Commonly known as: VISTARIL Take 50 mg by mouth daily as needed.  Indication: Feeling Anxious   QUEtiapine 50 MG tablet Commonly known as: SEROQUEL Take 3 tablets (150 mg total) by mouth at bedtime. What changed:  medication strength how much to take when to take this  Indication: Depressive Phase of Manic-Depression   risperiDONE 0.5 MG tablet Commonly known as: RISPERDAL Take 1 tablet (0.5 mg total) by mouth 2 (two) times daily at 8 am and 4 pm.  Indication: Major Depressive Disorder   traZODone 50 MG tablet Commonly known as: DESYREL Take 1 tablet (50 mg total) by mouth at bedtime as needed for sleep.  Indication: Major Depressive Disorder   Ventolin HFA 108 (90 Base) MCG/ACT inhaler Generic drug: albuterol Inhale 2 puffs into the lungs every 4 (four) hours as needed for wheezing or shortness of breath. Replaces: albuterol 108 (90 Base) MCG/ACT inhaler  Indication: Asthma        Follow-up Information     [facility] Follow up.   Specialty: Addiction Medicine Why: You have been accepted to [facility]. Contact  information: [address] [place] [address] [phone]                 Follow-up recommendations:  Activity:  as tolerated Diet:  heart healthy  Comments:  1.The patient is stable for discharge, with no ongoing suicidal ideation. He will continue his treatment at  [facility] and follow up as needed. 2.The patient will transition to [facility] for continued substance use treatment. 3. LCSW has confirmed the patient's appointment at [facility] on 10/7 at 1 PM and will assist with transportation needs. 4.The patient reports the ability to pay for his medications through [facility] Tailored Plan and will continue to comply with his prescribed regimen. 5.The patient plans to retrieve his belongindgs from [facility] transition to [facility] for continued treatment. 4. FLUoxetine (PROZAC) 20 MG capsule 30 days with 1 refill 5. QUEtiapine 150 MG TABS 30 days x 1 refill 6.risperiDONE (RISPERDAL) 0.5 MG tablet  30 days with 1 refill 7.traZODone (DESYREL) 50 MG tablet 30 days with 1 refill 8. VENTOLIN HFA 108 (90 Base) MCG/ACT inhaler 30 days with 1 refil  Signed: Myriam Forehand, NP 02/01/2023, 12:02 PM

## 2023-02-01 NOTE — Group Note (Signed)
Recreation Therapy Group Note   Group Topic:Problem Solving  Group Date: 02/01/2023 Start Time: 1000 End Time: 1100 Facilitators: Rosina Lowenstein, LRT, CTRS Location:  Craft Room  Group Description: Life Boat. Patients were given the scenario that they are on a boat that is about to become shipwrecked, leaving them stranded on an [place]. They are asked to make a list of 15 different items that they want to take with them when they are stranded on the [place]. Patients are asked to rank their items from most important to least important, #1 being the most important and #15 being the least. Patients will work individually for the first round to come up with 15 items and then pair up with a peer(s) to condense their list and come up with one list of 15 items between the two of them. Patients or LRT will read aloud the 15 different items to the group after each round. LRT facilitated post-activity processing to discuss how this activity can be used in daily life post discharge.   Goal Area(s) Addressed:  Patient will identify priorities, wants and needs. Patient will communicate with LRT and peers. Patient will work collectively as a Administrator, Civil Service. Patient will work on Product manager.    Affect/Mood: Appropriate   Participation Level: Minimal    Clinical Observations/Individualized Feedback: Kameren came to group more than half way through. Pt minimally engaged.   Plan: Continue to engage patient in RT group sessions 2-3x/week.   Rosina Lowenstein, LRT, CTRS 02/01/2023 12:00 PM

## 2023-02-01 NOTE — Progress Notes (Signed)
No complaints or concerns voiced. Denies SI, HI, AVH. Discharging tomorrow. Medications given as prescribed. Visible in milieu. Appropriate with staff and peers. Encouragement and support provided. Safety checks maintained. Medications given as prescribed. Pt receptive and remains safe on unit with q 15 min checks.

## 2023-02-01 NOTE — Group Note (Signed)
[facility] LCSW Group Therapy Note   Group Date: 01/28/2023 Start Time: 1315 End Time: 1420   Type of Therapy/Topic:  Group Therapy:  Balance in Life  Participation Level:  Minimal   Description of Group:    This group will address the concept of balance and how it feels and looks when one is unbalanced. Patients will be encouraged to process areas in their lives that are out of balance, and identify reasons for remaining unbalanced. Facilitators will guide patients utilizing problem- solving interventions to address and correct the stressor making their life unbalanced. Understanding and applying boundaries will be explored and addressed for obtaining  and maintaining a balanced life. Patients will be encouraged to explore ways to assertively make their unbalanced needs known to significant others in their lives, using other group members and facilitator for support and feedback.  Therapeutic Goals: Patient will identify two or more emotions or situations they have that consume much of in their lives. Patient will identify signs/triggers that life has become out of balance:  Patient will identify two ways to set boundaries in order to achieve balance in their lives:  Patient will demonstrate ability to communicate their needs through discussion and/or role plays  Summary of Patient Progress: Patient was present for the majority of the group process. He was minimally involved in the discussion and would open the door for all new pts coming into the room. Insight is questionable as pt did not participate in the discussion. However, he appeared open and receptive to feedback/comments from both his peers and facilitator.     Therapeutic Modalities:   Cognitive Behavioral Therapy Solution-Focused Therapy Assertiveness Training   Glenis Smoker, LCSW

## 2023-02-01 NOTE — Progress Notes (Signed)
  [facility] Adult Case Management Discharge Plan :  Will you be returning to the same living situation after discharge:  No. Patient reports that he is going to [facility].  He reports that he is going to his [employer] to get his belongings and sponsor will take him to [facility] At discharge, do you have transportation home?: Yes,  CSW to assist with transportation needs Do you have the ability to pay for your medications: Yes,  PARTNERS TAILORED PLAN / PARTNERS TAILORED PLAN  Release of information consent forms completed and in the chart;  Patient's signature needed at discharge.  Patient to Follow up at:  Follow-up Information     [facility] Follow up.   Specialty: Addiction Medicine Why: You have been accepted to [facility]. Contact information: [address] [place] [address] [phone]                 Next level of care provider has access to [facility] Link:no  Safety Planning and Suicide Prevention discussed: Yes,  SPE completed with the patient.     Has patient been referred to the Quitline?: Patient refused referral for treatment  Patient has been referred for addiction treatment: Yes, referral information given appointment made with [facility] 02/01/23   Harden Mo, LCSW 02/01/2023, 10:16 AM

## 2023-02-02 MED ORDER — HYDROCORTISONE 1 % EX CREA: TOPICAL_CREAM | Freq: Three times a day (TID) | CUTANEOUS | 0 refills | Status: AC | PRN

## 2023-02-02 MED ORDER — FLUOXETINE HCL 20 MG PO CAPS: 20.0000 mg | ORAL_CAPSULE | Freq: Every day | ORAL | 1 refills | Status: DC

## 2023-02-02 MED ORDER — QUETIAPINE FUMARATE 50 MG PO TABS: 150.0000 mg | ORAL_TABLET | Freq: Every day | ORAL | 1 refills | Status: DC

## 2023-02-02 MED ORDER — RISPERIDONE 0.5 MG PO TABS: 0.5000 mg | ORAL_TABLET | ORAL | 1 refills | Status: DC

## 2023-02-02 MED ORDER — TRAZODONE HCL 50 MG PO TABS: 50.0000 mg | ORAL_TABLET | Freq: Every evening | ORAL | 1 refills | Status: DC | PRN

## 2023-02-02 MED ORDER — ALBUTEROL SULFATE HFA 108 (90 BASE) MCG/ACT IN AERS: 2.0000 | INHALATION_SPRAY | Freq: Four times a day (QID) | RESPIRATORY_TRACT | 0 refills | Status: DC | PRN

## 2023-02-02 NOTE — Progress Notes (Signed)
Patient ID: Nathan Koch, male   DOB: [birth year], 32 y.o.   MRN: [id number]  Discharge Note:  Patient denies SI/HI/AVH at this time. Discharge instructions, AVS, prescriptions, medication supply and transition record gone over with patient. Patient given a copy of his Suicide Safety Plan. Patient agrees to comply with medication management, follow-up visit, and outpatient therapy. Patient belongings returned to patient. Patient questions and concerns addressed and answered. Patient ambulatory off unit. Patient discharged to [facility] via [employer] services.

## 2023-02-02 NOTE — Group Note (Signed)
Date:  02/02/2023 Time:  10:15 AM  Group Topic/Focus:  Goals Group:   The focus of this group is to help patients establish daily goals to achieve during treatment and discuss how the patient can incorporate goal setting into their daily lives to aide in recovery.    Participation Level:  Active  Participation Quality:  Appropriate  Affect:  Appropriate  Cognitive:  Appropriate  Insight: Appropriate  Engagement in Group:  Engaged  Modes of Intervention:  Discussion, Education, and Support  Additional Comments:    Wilford Corner 02/02/2023, 10:15 AM

## 2023-02-02 NOTE — Group Note (Signed)
Recreation Therapy Group Note   Group Topic:Health and Wellness  Group Date: 02/02/2023 Start Time: 1000 End Time: 1100 Facilitators: Rosina Lowenstein, LRT, CTRS Location: Courtyard  Group Description: [employer]. LRT and patients played games of basketball, drew with chalk, and played corn hole while outside in the courtyard while getting fresh air and sunlight. Music was being played in the background. LRT and peers conversed about different games they have played before. LRT encouraged pts to drink water after being outside, sweating and getting their heart rate up.  Goal Area(s) Addressed: Patient will build on frustration tolerance skills. Patients will partake in a competitive play game with peers. Patients will gain knowledge of new leisure interest/hobby.    Affect/Mood: Appropriate   Participation Level: Active and Engaged   Participation Quality: Independent   Behavior: Calm and Cooperative   Speech/Thought Process: Coherent   Insight: Good   Judgement: Good   Modes of Intervention: Activity   Patient Response to Interventions:  Attentive and Receptive   Education Outcome:  Acknowledges education   Clinical Observations/Individualized Feedback: Nathan Koch was active in their participation of session activities and group discussion. Pt chose to talk with LRT and peers while outside for group. Pt interacted well with LRT and peers duration of session.   Plan: Continue to engage patient in RT group sessions 2-3x/week.   Rosina Lowenstein, LRT, CTRS 02/02/2023 12:08 PM

## 2023-02-02 NOTE — Progress Notes (Signed)
D- Patient alert and oriented. Patient presents in a pleasant mood on assessment reporting that he slept good last night, but had some complaints of lower back pain. Patient rated his pain a "7/10", but did not request anything PRN from this Clinical research associate. Patient endorsed hopelessness, depression, and anxiety on his self-inventory, rating them all a "7/10", stating "it's just a feeling". Patient denies SI, HI, AVH  at this time. Patient's goal for today is "discharge", in which he will "wake up in the morning", in order to achieve his goal.  A- Scheduled medications administered to patient, per MD orders. Support and encouragement provided. Routine safety checks conducted every 15 minutes. Patient informed to notify staff with problems or concerns.  R- No adverse drug reactions noted. Patient contracts for safety at this time. Patient compliant with medications and treatment plan. Patient receptive, calm, and cooperative. Patient interacts well with others on the unit. Patient remains safe at this time.

## 2023-02-02 NOTE — Progress Notes (Signed)
  [facility] Adult Case Management Discharge Plan :  Will you be returning to the same living situation after discharge:  No. Pt will be going to [facility] for rehab  At discharge, do you have transportation home?: Yes,  CSW to assist with transportation needs  Do you have the ability to pay for your medications: Yes,  PARTNERS TAILORED PLAN / PARTNERS TAILORED PLAN  Release of information consent forms completed and in the chart;  Patient's signature needed at discharge.  Patient to Follow up at:  Follow-up Information     [facility] Follow up.   Specialty: Addiction Medicine Why: You have been accepted to [facility]. Contact information: [address] [place] [address] [phone]                 Next level of care provider has access to [facility] Link:no  Safety Planning and Suicide Prevention discussed: Yes,  SPE completed with pt      Has patient been referred to the Quitline?: Patient refused referral for treatment  Patient has been referred for addiction treatment: Yes, the patient will follow up with an outpatient provider for substance use disorder. Psychiatrist/APP: appointment made Pt to go to [facility] 02/02/23  [name] Rafter, LCSWA 02/02/2023, 9:15 AM

## 2024-03-22 ENCOUNTER — Ambulatory Visit
Admission: EM | Admit: 2024-03-22 | Discharge: 2024-03-22 | Disposition: A | Discharge status: Home / Self Care | Payer: MEDICAID

## 2024-03-22 DIAGNOSIS — J069 Acute upper respiratory infection, unspecified: Secondary | ICD-10-CM | POA: Diagnosis not present

## 2024-03-22 DIAGNOSIS — R059 Cough, unspecified: Secondary | ICD-10-CM

## 2024-03-22 LAB — POC COVID19/FLU A&B COMBO
Covid Antigen, POC: NEGATIVE
Influenza A Antigen, POC: NEGATIVE
Influenza B Antigen, POC: NEGATIVE

## 2024-03-22 MED ORDER — FLUTICASONE PROPIONATE 50 MCG/ACT NA SUSP: 2.0000 | Freq: Every day | NASAL | 0 refills | Status: DC

## 2024-03-22 MED ORDER — CETIRIZINE HCL 10 MG PO TABS: 10.0000 mg | ORAL_TABLET | Freq: Every day | ORAL | 0 refills | Status: DC

## 2024-03-22 MED ORDER — PSEUDOEPH-BROMPHEN-DM 30-2-10 MG/5ML PO SYRP: 5.0000 mL | ORAL_SOLUTION | Freq: Three times a day (TID) | ORAL | 0 refills | Status: DC | PRN

## 2024-03-22 NOTE — ED Triage Notes (Addendum)
 Cough, congestion, fatigue x 3 days. Not taking any otc medication at this time.

## 2024-03-22 NOTE — Discharge Instructions (Signed)
 The COVID/flu test was negative. Take medication as prescribed. Increase fluids and allow for plenty of rest. You may take over-the-counter Tylenol  as needed for pain, fever, or general discomfort. Recommend the use of a humidifier in your bedroom at nighttime during sleep and sleeping elevated on pillows while symptoms persist. Your symptoms should begin to improve over the next 5 to 7 days.  If symptoms fail to improve, or begin to worsen, you may follow-up in this clinic or with your primary care physician for further evaluation. Follow-up as needed.

## 2024-03-22 NOTE — ED Provider Notes (Addendum)
 [facility]    CSN: [id number] Arrival date & time: 03/22/24  1130      History   Chief Complaint Chief Complaint  Patient presents with   Cough    HPI Nathan Koch is a 33 y.o. male.   The history is provided by the patient.   Patient presents for a 2-day history of cough, chest congestion, generalized fatigue, and postnasal drainage.  Patient denies fever, chills, headache, ear pain, nasal congestion, runny nose, wheezing, difficulty breathing, chest pain, abdominal pain, nausea, vomiting, diarrhea, or rash.  Patient denies any obvious close sick contacts.  So far, states he is not taking any medications for his symptoms. 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   Self-inflicted laceration of left wrist ([name]) 01/17/2021   Severe recurrent major depression without psychotic features ([name]) 01/16/2021   Bipolar 1 disorder, depressed ([name])    MDD (major depressive disorder), recurrent episode, severe ([name]) 09/11/2020   MDD (major depressive disorder), recurrent severe, without psychosis ([name]) 09/10/2020   Suicidal ideation    Depression, major, recurrent, 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   Methamphetamine use disorder, severe ([name]) 09/14/2019   MDD (major depressive disorder), recurrent, severe, 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 Authorizing Provider  brompheniramine-pseudoephedrine-DM 30-2-10 MG/5ML syrup Take 5 mLs by mouth 3 (three) times daily as needed. 03/22/24  Yes Leath-Warren, Etta PARAS, NP  cetirizine  (ZYRTEC ) 10 MG tablet Take 1 tablet (10 mg total) by mouth daily. 03/22/24  Yes Leath-Warren, Etta PARAS, NP  Continuous Glucose Sensor (FREESTYLE LIBRE 3 PLUS SENSOR) MISC 1 each by Does not apply route. 01/20/24  Yes [provider]  fluticasone  (FLONASE ) 50 MCG/ACT nasal spray Place 2 sprays into both nostrils daily. 03/22/24  Yes Leath-Warren, Etta PARAS, NP  Insulin Glargine (BASAGLAR KWIKPEN) 100 UNIT/ML Inject 15 Units into the skin. 03/01/24  Yes [provider]  metFORMIN (GLUCOPHAGE) 500 MG tablet Take 1,000 mg by mouth 2 (two) times daily. 03/17/24  Yes [provider]  QUEtiapine  (SEROQUEL ) 50 MG tablet Take 3 tablets (150 mg total) by mouth at bedtime. 02/02/23 03/22/24 Yes Lee, Jacqueline Eun, NP  Semaglutide,0.25 or 0.5MG /DOS, 2 MG/3ML SOPN Inject 0.5 mg into the skin once a week. 02/25/24  Yes [provider]  albuterol  (VENTOLIN  HFA) 108 (90 Base) MCG/ACT inhaler Inhale 2  puffs into the lungs every 6 (six) hours as needed for wheezing or shortness of breath. 02/02/23   Lee, Jacqueline Eun, NP  FLUoxetine  (PROZAC ) 20 MG capsule Take 1 capsule (20 mg total) by mouth daily. 02/02/23 04/03/23  Lee, Jacqueline Eun, NP  risperiDONE  (RISPERDAL ) 0.5 MG tablet  Take 1 tablet (0.5 mg total) by mouth 2 (two) times daily at 8 am and 4 pm. 02/02/23 03/04/23  Lee, Jacqueline Eun, NP  traZODone  (DESYREL ) 50 MG tablet Take 1 tablet (50 mg total) by mouth at bedtime as needed for sleep. 02/02/23 03/04/23  Jama Arlyne Pollack, NP    Family History Family History  Problem Relation Age of Onset   Asthma Mother    Cancer Mother        breast cancer   Ulcers Mother    Cancer Father        esophogeal cancer   Hypertension Father    Asthma Brother     Social History Social History   Tobacco Use   Smoking status: Every Day    Current packs/day: 0.50    Average packs/day: 0.5 packs/day for 8.0 years (4.0 ttl pk-yrs)    Types: Cigarettes   Smokeless tobacco: Never  Vaping Use   Vaping status: Every Day   Substances: Nicotine , Flavoring  Substance Use Topics   Alcohol use: Not Currently    Alcohol/week: 13.0 standard drinks of alcohol    Types: 6 Cans of beer, 7 Standard drinks or equivalent per week    Comment: previously heavy drinker 2016. most recent 4 beer/ day drinker and none since 01-11-2020   Drug use: Yes    Types: Methamphetamines    Comment: states he uses 1 g every other day     Allergies   Latex   Review of Systems Review of Systems Per HPI  Physical Exam Triage Vital Signs ED Triage Vitals  Encounter Vitals Group     BP 03/22/24 1153 104/70     Girls Systolic BP Percentile --      Girls Diastolic BP Percentile --      Boys Systolic BP Percentile --      Boys Diastolic BP Percentile --      Pulse Rate 03/22/24 1153 79     Resp 03/22/24 1153 16     Temp 03/22/24 1153 97.9 F (36.6 C)     Temp Source 03/22/24 1153 Oral     SpO2 03/22/24 1153 96 %     Weight --      Height --      Head Circumference --      Peak Flow --      Pain Score 03/22/24 1154 0     Pain Loc --      Pain Education --      Exclude from Growth Chart --    No data found.  Updated Vital Signs BP 104/70 (BP Location: Right Arm)   Pulse 79    Temp 97.9 F (36.6 C) (Oral)   Resp 16   SpO2 96%   Visual Acuity Right Eye Distance:   Left Eye Distance:   Bilateral Distance:    Right Eye Near:   Left Eye Near:    Bilateral Near:     Physical Exam Vitals and nursing note reviewed.  Constitutional:      General: He is not in acute distress.    Appearance: Normal appearance. He is well-developed.  HENT:     Head: Normocephalic and atraumatic.  Right Ear: Tympanic membrane, ear canal and external ear normal.     Left Ear: Tympanic membrane, ear canal and external ear normal.     Nose: Nose normal.     Right Turbinates: Enlarged and swollen.     Left Turbinates: Enlarged and swollen.     Right Sinus: No maxillary sinus tenderness or frontal sinus tenderness.     Left Sinus: No maxillary sinus tenderness or frontal sinus tenderness.     Mouth/Throat:     Lips: Pink.     Mouth: Mucous membranes are moist.     Pharynx: Uvula midline. Postnasal drip present. No pharyngeal swelling, oropharyngeal exudate, posterior oropharyngeal erythema or uvula swelling.     Comments: Cobblestoning present to posterior oropharynx  Eyes:     Extraocular Movements: Extraocular movements intact.     Conjunctiva/sclera: Conjunctivae normal.     Pupils: Pupils are equal, round, and reactive to light.  Neck:     Thyroid : No thyromegaly.     Trachea: No tracheal deviation.  Cardiovascular:     Rate and Rhythm: Normal rate and regular rhythm.     Pulses: Normal pulses.     Heart sounds: Normal heart sounds.  Pulmonary:     Effort: Pulmonary effort is normal. No respiratory distress.     Breath sounds: Normal breath sounds. No stridor. No wheezing, rhonchi or rales.  Abdominal:     General: Bowel sounds are normal.     Palpations: Abdomen is soft.     Tenderness: There is no abdominal tenderness.  Musculoskeletal:     Cervical back: Normal range of motion and neck supple.  Skin:    General: Skin is warm and dry.  Neurological:      General: No focal deficit present.     Mental Status: He is alert and oriented to person, place, and time.  Psychiatric:        Mood and Affect: Mood normal.        Behavior: Behavior normal.        Thought Content: Thought content normal.        Judgment: Judgment normal.      UC Treatments / Results  Labs (all labs ordered are listed, but only abnormal results are displayed) Labs Reviewed  POC COVID19/FLU A&B COMBO    EKG   Radiology No results found.  Procedures Procedures (including critical care time)  Medications Ordered in UC Medications - No data to display  Initial Impression / Assessment and Plan / UC Course  I have reviewed the triage vital signs and the nursing notes.  Pertinent labs & imaging results that were available during my care of the patient were reviewed by me and considered in my medical decision making (see chart for details).  COVID/flu test was negative.  On exam, the patient's lung sounds are clear throughout, room air sats are at 96%.  He is well-appearing, is in no acute distress, vital signs are stable.  Symptoms are consistent with viral etiology.  Will provide symptomatic treatment with cetirizine  10 mg and fluticasone  50 mcg nasal spray.  Supportive care recommendations were provided and discussed with the patient to include fluids, rest, over-the-counter analgesics, and use of a humidifier during sleep.  Discussed indications with the patient regarding follow-up.  Patient was in agreement with this plan of care and verbalizes understanding.  All questions were answered.  Patient stable for discharge.  Final Clinical Impressions(s) / UC Diagnoses   Final diagnoses:  Cough, unspecified type  Viral URI     Discharge Instructions      The COVID/flu test was negative. Take medication as prescribed. Increase fluids and allow for plenty of rest. You may take over-the-counter Tylenol  as needed for pain, fever, or general  discomfort. Recommend the use of a humidifier in your bedroom at nighttime during sleep and sleeping elevated on pillows while symptoms persist. Your symptoms should begin to improve over the next 5 to 7 days.  If symptoms fail to improve, or begin to worsen, you may follow-up in this clinic or with your primary care physician for further evaluation. Follow-up as needed.     ED Prescriptions     Medication Sig Dispense Auth. Provider   brompheniramine-pseudoephedrine-DM 30-2-10 MG/5ML syrup Take 5 mLs by mouth 3 (three) times daily as needed. 140 mL Leath-Warren, Etta PARAS, NP   cetirizine  (ZYRTEC ) 10 MG tablet Take 1 tablet (10 mg total) by mouth daily. 30 tablet Leath-Warren, Etta PARAS, NP   fluticasone  (FLONASE ) 50 MCG/ACT nasal spray Place 2 sprays into both nostrils daily. 16 g Leath-Warren, Etta PARAS, NP      PDMP not reviewed this encounter.   Gilmer Etta PARAS, NP 03/22/24 1227    Gilmer Etta PARAS, NP 03/22/24 1241

## 2024-04-27 ENCOUNTER — Other Ambulatory Visit (INDEPENDENT_AMBULATORY_CARE_PROVIDER_SITE_OTHER)
Admission: EM | Admit: 2024-04-27 | Discharge: 2024-05-02 | Disposition: A | Discharge status: Other Acute Inpatient Hospital | Payer: MEDICAID | Source: Intra-hospital | Attending: Psychiatry | Admitting: Psychiatry

## 2024-04-27 ENCOUNTER — Other Ambulatory Visit: Payer: Self-pay

## 2024-04-27 ENCOUNTER — Emergency Department (HOSPITAL_COMMUNITY)
Admission: EM | Admit: 2024-04-27 | Discharge: 2024-04-27 | Disposition: A | Discharge status: Other Acute Inpatient Hospital | Payer: MEDICAID | Source: Home / Self Care | Attending: Emergency Medicine | Admitting: Emergency Medicine

## 2024-04-27 ENCOUNTER — Encounter (HOSPITAL_COMMUNITY): Payer: Self-pay

## 2024-04-27 DIAGNOSIS — F1590 Other stimulant use, unspecified, uncomplicated: Secondary | ICD-10-CM

## 2024-04-27 DIAGNOSIS — F1594 Other stimulant use, unspecified with stimulant-induced mood disorder: Secondary | ICD-10-CM | POA: Diagnosis present

## 2024-04-27 DIAGNOSIS — E860 Dehydration: Secondary | ICD-10-CM | POA: Insufficient documentation

## 2024-04-27 DIAGNOSIS — F1510 Other stimulant abuse, uncomplicated: Secondary | ICD-10-CM | POA: Diagnosis present

## 2024-04-27 DIAGNOSIS — Z79899 Other long term (current) drug therapy: Secondary | ICD-10-CM | POA: Insufficient documentation

## 2024-04-27 DIAGNOSIS — R7989 Other specified abnormal findings of blood chemistry: Secondary | ICD-10-CM | POA: Insufficient documentation

## 2024-04-27 DIAGNOSIS — Z7984 Long term (current) use of oral hypoglycemic drugs: Secondary | ICD-10-CM | POA: Insufficient documentation

## 2024-04-27 DIAGNOSIS — E1165 Type 2 diabetes mellitus with hyperglycemia: Secondary | ICD-10-CM | POA: Insufficient documentation

## 2024-04-27 DIAGNOSIS — F1514 Other stimulant abuse with stimulant-induced mood disorder: Secondary | ICD-10-CM | POA: Insufficient documentation

## 2024-04-27 DIAGNOSIS — Z794 Long term (current) use of insulin: Secondary | ICD-10-CM | POA: Insufficient documentation

## 2024-04-27 DIAGNOSIS — M79671 Pain in right foot: Secondary | ICD-10-CM | POA: Insufficient documentation

## 2024-04-27 DIAGNOSIS — F1520 Other stimulant dependence, uncomplicated: Secondary | ICD-10-CM | POA: Diagnosis present

## 2024-04-27 DIAGNOSIS — R45851 Suicidal ideations: Secondary | ICD-10-CM | POA: Diagnosis not present

## 2024-04-27 DIAGNOSIS — M79672 Pain in left foot: Secondary | ICD-10-CM | POA: Insufficient documentation

## 2024-04-27 DIAGNOSIS — E872 Acidosis, unspecified: Secondary | ICD-10-CM | POA: Insufficient documentation

## 2024-04-27 DIAGNOSIS — Z91040 Latex allergy status: Secondary | ICD-10-CM | POA: Insufficient documentation

## 2024-04-27 LAB — URINALYSIS, W/ REFLEX TO CULTURE (INFECTION SUSPECTED)
Bilirubin Urine: NEGATIVE
Glucose, UA: NEGATIVE mg/dL
Ketones, ur: 5 mg/dL — AB
Leukocytes,Ua: NEGATIVE
Nitrite: NEGATIVE
Protein, ur: 100 mg/dL — AB
Specific Gravity, Urine: 1.017 (ref 1.005–1.030)
pH: 5 (ref 5.0–8.0)

## 2024-04-27 LAB — CBC
HCT: 48.5 % (ref 39.0–52.0)
Hemoglobin: 16.1 g/dL (ref 13.0–17.0)
MCH: 28 pg (ref 26.0–34.0)
MCHC: 33.2 g/dL (ref 30.0–36.0)
MCV: 84.3 fL (ref 80.0–100.0)
Platelets: 446 K/uL — ABNORMAL HIGH (ref 150–400)
RBC: 5.75 MIL/uL (ref 4.22–5.81)
RDW: 12.8 % (ref 11.5–15.5)
WBC: 18.2 K/uL — ABNORMAL HIGH (ref 4.0–10.5)
nRBC: 0 % (ref 0.0–0.2)

## 2024-04-27 LAB — COMPREHENSIVE METABOLIC PANEL WITH GFR
ALT: 38 U/L (ref 0–44)
AST: 82 U/L — ABNORMAL HIGH (ref 15–41)
Albumin: 4.9 g/dL (ref 3.5–5.0)
Alkaline Phosphatase: 130 U/L — ABNORMAL HIGH (ref 38–126)
Anion gap: 25 — ABNORMAL HIGH (ref 5–15)
BUN: 50 mg/dL — ABNORMAL HIGH (ref 6–20)
CO2: 16 mmol/L — ABNORMAL LOW (ref 22–32)
Calcium: 10 mg/dL (ref 8.9–10.3)
Chloride: 94 mmol/L — ABNORMAL LOW (ref 98–111)
Creatinine, Ser: 1.69 mg/dL — ABNORMAL HIGH (ref 0.61–1.24)
GFR, Estimated: 55 mL/min — ABNORMAL LOW
Glucose, Bld: 147 mg/dL — ABNORMAL HIGH (ref 70–99)
Potassium: 3.8 mmol/L (ref 3.5–5.1)
Sodium: 136 mmol/L (ref 135–145)
Total Bilirubin: 2.7 mg/dL — ABNORMAL HIGH (ref 0.0–1.2)
Total Protein: 9.1 g/dL — ABNORMAL HIGH (ref 6.5–8.1)

## 2024-04-27 LAB — ETHANOL: Alcohol, Ethyl (B): <15 mg/dL

## 2024-04-27 LAB — BASIC METABOLIC PANEL WITH GFR
Anion gap: 15 (ref 5–15)
BUN: 36 mg/dL — ABNORMAL HIGH (ref 6–20)
CO2: 24 mmol/L (ref 22–32)
Calcium: 9.3 mg/dL (ref 8.9–10.3)
Chloride: 98 mmol/L (ref 98–111)
Creatinine, Ser: 1.17 mg/dL (ref 0.61–1.24)
GFR, Estimated: >60 mL/min
Glucose, Bld: 231 mg/dL — ABNORMAL HIGH (ref 70–99)
Potassium: 3.8 mmol/L (ref 3.5–5.1)
Sodium: 137 mmol/L (ref 135–145)

## 2024-04-27 LAB — ACETAMINOPHEN LEVEL: Acetaminophen (Tylenol), Serum: <10 ug/mL — ABNORMAL LOW (ref 10–30)

## 2024-04-27 LAB — URINE DRUG SCREEN
Amphetamines: POSITIVE — AB
Barbiturates: NEGATIVE
Benzodiazepines: NEGATIVE
Cocaine: NEGATIVE
Fentanyl: NEGATIVE
Methadone Scn, Ur: NEGATIVE
Opiates: NEGATIVE
Tetrahydrocannabinol: NEGATIVE

## 2024-04-27 LAB — SALICYLATE LEVEL: Salicylate Lvl: <7 mg/dL — ABNORMAL LOW (ref 7.0–30.0)

## 2024-04-27 MED ORDER — DIPHENHYDRAMINE HCL 50 MG PO CAPS: 50.0000 mg | ORAL_CAPSULE | Freq: Three times a day (TID) | ORAL | Status: DC | PRN

## 2024-04-27 MED ORDER — SODIUM CHLORIDE 0.9 % IV BOLUS
2000.0000 mL | Freq: Once | INTRAVENOUS | Status: AC
  Administered 2024-04-27: 2000 mL via INTRAVENOUS

## 2024-04-27 MED ORDER — DIPHENHYDRAMINE HCL 50 MG/ML IJ SOLN: 50.0000 mg | Freq: Three times a day (TID) | INTRAMUSCULAR | Status: DC | PRN

## 2024-04-27 MED ORDER — ACETAMINOPHEN 325 MG PO TABS: 650.0000 mg | ORAL_TABLET | Freq: Four times a day (QID) | ORAL | Status: DC | PRN

## 2024-04-27 MED ORDER — HALOPERIDOL LACTATE 5 MG/ML IJ SOLN: 10.0000 mg | Freq: Three times a day (TID) | INTRAMUSCULAR | Status: DC | PRN

## 2024-04-27 MED ORDER — HALOPERIDOL 5 MG PO TABS: 5.0000 mg | ORAL_TABLET | Freq: Three times a day (TID) | ORAL | Status: DC | PRN

## 2024-04-27 MED ORDER — HALOPERIDOL LACTATE 5 MG/ML IJ SOLN: 5.0000 mg | Freq: Three times a day (TID) | INTRAMUSCULAR | Status: DC | PRN

## 2024-04-27 MED ORDER — LORAZEPAM 2 MG/ML IJ SOLN: 2.0000 mg | Freq: Three times a day (TID) | INTRAMUSCULAR | Status: DC | PRN

## 2024-04-27 MED ORDER — HYDROXYZINE HCL 25 MG PO TABS
25.0000 mg | ORAL_TABLET | Freq: Three times a day (TID) | ORAL | Status: DC | PRN
  Filled 2024-04-27: qty 1

## 2024-04-27 MED ORDER — TRAZODONE HCL 50 MG PO TABS
50.0000 mg | ORAL_TABLET | Freq: Every evening | ORAL | Status: DC | PRN
  Administered 2024-04-27: 50 mg via ORAL
  Filled 2024-04-27: qty 1

## 2024-04-27 MED ORDER — MAGNESIUM HYDROXIDE 400 MG/5ML PO SUSP: 30.0000 mL | Freq: Every day | ORAL | Status: DC | PRN

## 2024-04-27 MED ORDER — ALUM & MAG HYDROXIDE-SIMETH 200-200-20 MG/5ML PO SUSP: 30.0000 mL | ORAL | Status: DC | PRN

## 2024-04-27 NOTE — ED Triage Notes (Signed)
 Relapsed on methamphetamines and says he's been having a tough time with his thoughts.   Denied any suicidal ideation in triage.

## 2024-04-27 NOTE — ED Notes (Addendum)
 We can accept the patient to [facility] pending the EKG and consent. Bed 162. Accepting provider is Dr. cole. Report can be called to [phone]

## 2024-04-27 NOTE — Group Note (Signed)
 Group Topic: Social Support  Group Date: 04/27/2024 Start Time: 0800 End Time: 0845 Facilitators: Nena Lesley PARAS, NT  Department: [facility]  Number of Participants: 5  Group Focus: check in and community group Treatment Modality:  Psychoeducation Interventions utilized were exploration and support Purpose: explore maladaptive thinking, express feelings, and increase insight  Name: Nathan Koch Date of Birth: [birth year]  MR: [id number]    Level of Participation: PT had not been admitted when this group was conducted. 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   Self-inflicted laceration of left wrist ([name]) 01/17/2021   Severe recurrent major depression without psychotic features ([name]) 01/16/2021   Bipolar 1 disorder, depressed ([name])    MDD (major depressive disorder), recurrent episode, severe ([name]) 09/11/2020   MDD (major depressive disorder), recurrent severe, without psychosis ([name]) 09/10/2020   Suicidal ideation    Depression, major, recurrent, 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

## 2024-04-27 NOTE — ED Provider Notes (Signed)
 Nathan Koch signed out to me at 0700 by Dr. Emil pending repeat labs.  In short this is a 34 year old male with past medical history of meth use that presented to the emergency department with suicidal ideation.  He is mildly tachycardic but otherwise hemodynamically stable in no acute distress on his arrival.  Initial labs showed AKI with a creatinine of 1.7 from baseline normal as well as an anion gap of 25.  The Nathan Koch had urine, Tylenol  and salicylates added on and will be started on IV fluids.  He is currently asymptomatic at this time.  Will plan for repeat BMP after fluids prior to medical clearance.  1:36 PM Repeat labs normalized. Nathan Koch is medically cleared. Accepted to inpatient psych by Dr. Cole [facility].    Kingsley, Earlin Sweeden K, DO 04/27/24 1337
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
## 2024-04-27 NOTE — ED Notes (Signed)
 Patient denies pain and is resting comfortably.

## 2024-04-27 NOTE — ED Notes (Signed)
 Belongings collected. Pt dressed in burgundy hospital provided scrubs.   Wanded by security.

## 2024-04-27 NOTE — ED Notes (Signed)
 Patient has belongings in bag. [place]nor sweater, black pants, no shoes, 1/2 of a sock. Silver necklace, and a black watch.

## 2024-04-27 NOTE — ED Notes (Signed)
 This writer attempted to wake PT up for dinner and to collect blood labs. PT was confused and slow to respond. PT said he feels okay but he is coming down and needs to lay down longer. This clinical research associate will attempt to collect labs again at a later time.

## 2024-04-27 NOTE — Progress Notes (Signed)
 Patient has been accepted to [facility]

## 2024-04-27 NOTE — ED Provider Notes (Addendum)
 " [place] [facility] AT [facility] Provider Note   CSN: [id number] Arrival date & time: 04/27/24  0431     Patient presents with: Drug / Alcohol Assessment and Suicidal   Nathan Koch is a 34 y.o. male.   34 yo M with a chief complaints of suicidal ideation.  Patient with hx of meth abuse.  Says he relapsed about 3 days ago.  Since then has just been feeling down.  He said today he had a really rough day and started having thoughts of suicide.  Tells me he has attempted suicide before.  Thinks he would try to cut himself or maybe jump in front of traffic.   Drug / Alcohol Assessment      Prior to Admission medications  Medication Sig Start Date End Date Taking? Authorizing Provider  albuterol  (VENTOLIN  HFA) 108 (90 Base) MCG/ACT inhaler Inhale 2 puffs into the lungs every 6 (six) hours as needed for wheezing or shortness of breath. 02/02/23   Lee, Jacqueline Eun, NP  cetirizine  (ZYRTEC ) 10 MG tablet Take 1 tablet (10 mg total) by mouth daily. 03/22/24   Leath-Warren, Etta PARAS, NP  Continuous Glucose Sensor (FREESTYLE LIBRE 3 PLUS SENSOR) MISC 1 each by Does not apply route. 01/20/24   [provider]  FLUoxetine  (PROZAC ) 20 MG capsule Take 1 capsule (20 mg total) by mouth daily. 02/02/23 04/03/23  Lee, Jacqueline Eun, NP  fluticasone  (FLONASE ) 50 MCG/ACT nasal spray Place 2 sprays into both nostrils daily. 03/22/24   Leath-Warren, Etta PARAS, NP  Insulin Glargine (BASAGLAR KWIKPEN) 100 UNIT/ML Inject 15 Units into the skin. 03/01/24   [provider]  metFORMIN (GLUCOPHAGE) 500 MG tablet Take 1,000 mg by mouth 2 (two) times daily. 03/17/24   [provider]  QUEtiapine  (SEROQUEL ) 50 MG tablet Take 3 tablets (150 mg total) by mouth at bedtime. 02/02/23 03/22/24  Lee, Jacqueline Eun, NP  risperiDONE  (RISPERDAL ) 0.5 MG tablet Take 1 tablet (0.5 mg total) by mouth 2 (two) times daily at 8 am and 4 pm. 02/02/23 03/04/23  Lee, Jacqueline Eun, NP   Semaglutide,0.25 or 0.5MG /DOS, 2 MG/3ML SOPN Inject 0.5 mg into the skin once a week. 02/25/24   [provider]  traZODone  (DESYREL ) 50 MG tablet Take 1 tablet (50 mg total) by mouth at bedtime as needed for sleep. 02/02/23 03/04/23  Lee, Jacqueline Eun, NP    Allergies: Latex    Review of Systems  Updated Vital Signs BP 124/82 (BP Location: Left Arm)   Pulse (!) 110   Temp 97.8 F (36.6 C) (Oral)   Resp 17   SpO2 99%   Physical Exam Vitals and nursing note reviewed.  Constitutional:      Appearance: He is well-developed.  HENT:     Head: Normocephalic and atraumatic.  Eyes:     Pupils: Pupils are equal, round, and reactive to light.  Neck:     Vascular: No JVD.  Cardiovascular:     Rate and Rhythm: Normal rate and regular rhythm.     Heart sounds: No murmur heard.    No friction rub. No gallop.  Pulmonary:     Effort: No respiratory distress.     Breath sounds: No wheezing.  Abdominal:     General: There is no distension.     Tenderness: There is no abdominal tenderness. There is no guarding or rebound.  Musculoskeletal:        General: Normal range of motion.  Cervical back: Normal range of motion and neck supple.  Skin:    Coloration: Skin is not pale.     Findings: No rash.  Neurological:     Mental Status: He is alert and oriented to person, place, and time.  Psychiatric:        Behavior: Behavior normal.     (all labs ordered are listed, but only abnormal results are displayed) Labs Reviewed  COMPREHENSIVE METABOLIC PANEL WITH GFR - Abnormal; Notable for the following components:      Result Value   Chloride 94 (*)    CO2 16 (*)    Glucose, Bld 147 (*)    BUN 50 (*)    Creatinine, Ser 1.69 (*)    Total Protein 9.1 (*)    AST 82 (*)    Alkaline Phosphatase 130 (*)    Total Bilirubin 2.7 (*)    GFR, Estimated 55 (*)    Anion gap 25 (*)    All other components within normal limits  CBC - Abnormal; Notable for the following components:    WBC 18.2 (*)    Platelets 446 (*)    All other components within normal limits  URINE DRUG SCREEN - Abnormal; Notable for the following components:   Amphetamines POSITIVE (*)    All other components within normal limits  ETHANOL  BASIC METABOLIC PANEL WITH GFR  URINALYSIS, W/ REFLEX TO CULTURE (INFECTION SUSPECTED)  SALICYLATE LEVEL  ACETAMINOPHEN  LEVEL    EKG: None  Radiology: No results found.   Procedures   Medications Ordered in the ED  sodium chloride  0.9 % bolus 2,000 mL (has no administration in time range)                                    Medical Decision Making Amount and/or Complexity of Data Reviewed Labs: ordered.   34 yo M with a chief complaint of suicidal ideation.  Patient denies any medical concern.  He is mildly tachycardic on arrival.  Could be due to the methamphetamine use.    Patient does have metabolic acidosis with anion gap and BUN and creatinine elevation.  I suspect this is likely due to dehydration.  Will add on a UA to assess for ketones.  Will give 2 L of IV fluids.    Patient was seen by psychiatry with plans for likely inpatient care.  Will repeat BMP in the morning.  Signed out to Dr. Ellouise, please see their note for further details of care in the ED.   The patients results and plan were reviewed and discussed.   Any x-rays performed were independently reviewed by myself.   Differential diagnosis were considered with the presenting HPI.  Medications  sodium chloride  0.9 % bolus 2,000 mL (has no administration in time range)    Vitals:   04/27/24 0446  BP: 124/82  Pulse: (!) 110  Resp: 17  Temp: 97.8 F (36.6 C)  TempSrc: Oral  SpO2: 99%    Final diagnoses:  Suicidal ideation  Dehydration  Methamphetamine use         Final diagnoses:  Suicidal ideation  Dehydration  Methamphetamine use    ED Discharge Orders     None          Emil Share, DO 04/27/24 0700    Emil Share, DO 04/27/24  0700  "

## 2024-04-27 NOTE — ED Notes (Signed)
 Pt observed at the beginning of the shift , awake alert oriented, in bedroom, stating that he had missed dinner and was hurgry, needs anticipated.  Safety maintained.

## 2024-04-27 NOTE — ED Notes (Signed)
 Pt is able to make needs known, alert and oriented to time and place.   Denies SI, .  Offering good eye contact.  Safety maintained.

## 2024-04-27 NOTE — ED Notes (Signed)
 Will be getting patients EKG as soon as he is fished eating. Have been attempting to get it done but patient is still eating.

## 2024-04-27 NOTE — ED Notes (Signed)
Pt placed in private room for TTS consult

## 2024-04-27 NOTE — ED Notes (Signed)
 Pt transferred from [facility] requesting detox from opioids or d/t endorsing SI/AH. Calm, cooperative throughout interview process. Skin assessment completed. Oriented to unit. Meal and drink offered. At currrent, pt continue to endorse SI and AH. Pt verbally contract for safety. Will monitor for safety.

## 2024-04-27 NOTE — ED Notes (Signed)
 Patient states that he has been walking barefoot in the street for a day. On admission he came in with road rash like abrasion sores on the sole of his feet R and L.

## 2024-04-28 DIAGNOSIS — R7309 Other abnormal glucose: Secondary | ICD-10-CM | POA: Diagnosis not present

## 2024-04-28 DIAGNOSIS — F1510 Other stimulant abuse, uncomplicated: Secondary | ICD-10-CM

## 2024-04-28 LAB — LIPID PANEL
Cholesterol: 90 mg/dL (ref 0–200)
HDL: 30 mg/dL — ABNORMAL LOW
LDL Cholesterol: 41 mg/dL (ref 0–99)
Total CHOL/HDL Ratio: 3 ratio
Triglycerides: 95 mg/dL
VLDL: 19 mg/dL (ref 0–40)

## 2024-04-28 LAB — TSH: TSH: 2.6 u[IU]/mL (ref 0.350–4.500)

## 2024-04-28 LAB — GLUCOSE, CAPILLARY
Glucose-Capillary: 238 mg/dL — ABNORMAL HIGH (ref 70–99)
Glucose-Capillary: 240 mg/dL — ABNORMAL HIGH (ref 70–99)
Glucose-Capillary: 286 mg/dL — ABNORMAL HIGH (ref 70–99)

## 2024-04-28 LAB — HEMOGLOBIN A1C
Hgb A1c MFr Bld: 6.7 % — ABNORMAL HIGH (ref 4.8–5.6)
Mean Plasma Glucose: 145.59 mg/dL

## 2024-04-28 MED ORDER — QUETIAPINE FUMARATE 50 MG PO TABS
150.0000 mg | ORAL_TABLET | Freq: Every day | ORAL | Status: DC
  Administered 2024-04-28 – 2024-05-01 (×4): 150 mg via ORAL
  Filled 2024-04-28 (×4): qty 1

## 2024-04-28 MED ORDER — INSULIN GLARGINE 100 UNIT/ML ~~LOC~~ SOLN
15.0000 [IU] | Freq: Every day | SUBCUTANEOUS | Status: DC
  Administered 2024-04-28 – 2024-05-02 (×5): 15 [IU] via SUBCUTANEOUS
  Filled 2024-04-28: qty 0.15

## 2024-04-28 MED ORDER — INSULIN ASPART 100 UNIT/ML IJ SOLN
0.0000 [IU] | Freq: Three times a day (TID) | INTRAMUSCULAR | Status: DC
  Administered 2024-04-29 (×3): 5 [IU] via SUBCUTANEOUS
  Administered 2024-05-01: 3 [IU] via SUBCUTANEOUS
  Filled 2024-04-28: qty 0.15

## 2024-04-28 NOTE — Group Note (Signed)
 Group Topic: Emotional Regulation  Group Date: 04/28/2024 Start Time: 1930 End Time: 2000 Facilitators: Bryan Saddie CROME, [place]  Department: [facility]  Number of Participants: 3  Group Focus: anxiety Treatment Modality:  Individual Therapy Interventions utilized were group exercise Purpose: enhance coping skills  Name: Nathan Koch Date of Birth: [birth year]  MR: [id number]    Level of Participation: active Quality of Participation: attentive and cooperative Interactions with others: gave feedback Mood/Affect: appropriate Triggers (if applicable): n/a Cognition: coherent/clear Progress: Moderate Response: n/a 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   Self-inflicted laceration of left wrist ([name]) 01/17/2021   Severe recurrent major depression without psychotic features ([name]) 01/16/2021   Bipolar 1 disorder, depressed ([name])    MDD (major depressive disorder), recurrent episode, severe ([name]) 09/11/2020   MDD (major depressive disorder), recurrent severe, without psychosis ([name]) 09/10/2020   Suicidal ideation    Depression, major, recurrent, 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   MDD (major depressive disorder), recurrent, severe, with psychosis ([name]) 09/14/2019   MDD (major depressive disorder), single episode, severe with psychosis ([name]) 09/14/2019   Tobacco use disorder 02/21/2019   PTSD (post-traumatic stress disorder)  02/21/2019   Benzodiazepine abuse ([name]) 11/15/2017   Asthma 06/26/2014

## 2024-04-28 NOTE — ED Provider Notes (Signed)
 Facility Based Crisis Admission H&P  Date and Time: 04/28/2024 9:27 AM Name: Nathan Koch MRN:  [id number]  Diagnosis:  Final diagnoses:  Methamphetamine abuse [facility])   Chart reviewed and discussed with attending psychiatrist, Dr Kandi Hahn.   HPI: Pt was seen in [facility] ED on 04/27/24 voicing suicidal ideation in the context of return to use of methamphetamine three days ago. Since return to use pt was endorsing low mood which led to suicidal ideation with a plan to either cut himself or jump in traffic. In ED, pt noted to have metabolic acidosis with anion gap, elevated BUN and creatinine attributed to dehydration. Pt was given 2 liters of IVF in ED and eventually medically cleared. Pt was admitted voluntarily to [facility] on 04/27/2024. Pt is seen face-to-face on rounds on the Facility Based Crisis [facility]) unit. Pt is alert & oriented x 4 and engages in evaluation. He is seen in the dayroom eating breakfast. He requests double portions. Advised this is not a dietary option available on FBC. Pt is pleasant and discusses events that led to him presenting to the ED. States he had been abstinent from drugs and alcohol for the past year and was living in the [facility] residential recovery home in [place] [phone] [place]). States he was participating 12 step meetings and had a sponsor for support. States he had a return to use of methamphetamine 3-4 days ago and has been using unknown amount of methamphetamine for the past three days. Due to return to use, patient has had a low mood attributed to disappointment with return to use. This low mood triggered suicidal ideation and he walked out into traffic twice (cars avoided him) prior to being seen in the ED. Pt states he has engaged in self-injurious behavior in the past via cutting and has attempted suicide on 2-3 previous occasions and has required wound closure for his injuries. States that due to methamphetamine use, he has been sleeping poorly. He endorses  suicidal ideation a little bit. Denies plan or intent. He denies HI/AVH today. He endorses depressed mood. PHQ 9 today is 18 indicating subjective symptoms of moderately severe depression. Pt requests and would benefit from a structured outpatient substance use program. States he is unable to return to [facility] home due to return to use; you have to be gone 6 months after relapse. He would like to seek placement at [facility] in [place].   Labs from 04/27/24 reviewed. ETOH <15, Salicylate level <7.0, UDS positive for amphetamines. Glucose 231, HgbA1C 6.7.   Total Time spent with patient: 15 mins  Past Psychiatric History: Bipolar disorder, MDD, PTSD Past Medical History: Asthma (06/2014), Rhabdomyolysis (11/2021), Acute kidney injury (11/2021); self-inflicted laceration Left wrist (12/2020), benzodiazepine abuse (10/2017) Family History: family history includes Asthma in his brother and mother; Cancer in his father and mother; Hypertension in his father; Ulcers in his mother.  Family Psychiatric  History: Mother: Crack cocaine addiction; Father: alcohol use; Older Brother: deceased in 2011-05-20 from heroin OD Social History: Unhoused. Never been married. Christian. Has one daughter (75 yo) that lives with her mother. Denies current legal issues.  Trauma: Severe childhood neglect due to parent's substance use; found brother overdosing and attempted with revive without success Substance Use: Methamphetamine: half gram with last use 12/31 Access to Weapons: Denies  Sleep: Good  Appetite:  Good  Current Medications:  Current Facility-Administered Medications  Medication Dose Route Frequency Provider Last Rate Last Admin   acetaminophen  (TYLENOL ) tablet 650 mg  650 mg Oral Q6H PRN White, Patrice L, NP       alum & mag hydroxide-simeth (MAALOX/MYLANTA) 200-200-20 MG/5ML suspension 30 mL  30 mL Oral Q4H PRN White, Patrice L, NP       haloperidol (HALDOL) tablet 5 mg  5 mg Oral TID PRN White, Patrice  L, NP       And   diphenhydrAMINE  (BENADRYL ) capsule 50 mg  50 mg Oral TID PRN White, Patrice L, NP       haloperidol lactate (HALDOL) injection 5 mg  5 mg Intramuscular TID PRN White, Patrice L, NP       And   diphenhydrAMINE  (BENADRYL ) injection 50 mg  50 mg Intramuscular TID PRN White, Patrice L, NP       And   LORazepam  (ATIVAN ) injection 2 mg  2 mg Intramuscular TID PRN White, Patrice L, NP       haloperidol lactate (HALDOL) injection 10 mg  10 mg Intramuscular TID PRN White, Patrice L, NP       And   diphenhydrAMINE  (BENADRYL ) injection 50 mg  50 mg Intramuscular TID PRN White, Patrice L, NP       And   LORazepam  (ATIVAN ) injection 2 mg  2 mg Intramuscular TID PRN White, Patrice L, NP       hydrOXYzine  (ATARAX ) tablet 25 mg  25 mg Oral TID PRN White, Patrice L, NP       insulin glargine (LANTUS) injection 15 Units  15 Units Subcutaneous Daily Enes Wegener E, NP       magnesium  hydroxide (MILK OF MAGNESIA) suspension 30 mL  30 mL Oral Daily PRN White, Patrice L, NP       QUEtiapine  (SEROQUEL ) tablet 150 mg  150 mg Oral QHS Tiandre Teall E, NP       traZODone  (DESYREL ) tablet 50 mg  50 mg Oral QHS PRN White, Patrice L, NP   50 mg at 04/27/24 2132   Current Outpatient Medications  Medication Sig Dispense Refill   albuterol  (VENTOLIN  HFA) 108 (90 Base) MCG/ACT inhaler Inhale 2 puffs into the lungs every 6 (six) hours as needed for wheezing or shortness of breath. 6.7 g 0   cetirizine  (ZYRTEC ) 10 MG tablet Take 1 tablet (10 mg total) by mouth daily. (Patient not taking: Reported on 04/27/2024) 30 tablet 0   fluticasone  (FLONASE ) 50 MCG/ACT nasal spray Place 2 sprays into both nostrils daily. (Patient not taking: Reported on 04/27/2024) 16 g 0   Insulin Glargine (BASAGLAR KWIKPEN) 100 UNIT/ML Inject 15 Units into the skin daily.     metFORMIN (GLUCOPHAGE) 500 MG tablet Take 1,000 mg by mouth 2 (two) times daily. (Patient not taking: Reported on 04/27/2024)     QUEtiapine  (SEROQUEL ) 50 MG tablet  Take 3 tablets (150 mg total) by mouth at bedtime. 90 tablet 1   Semaglutide,0.25 or 0.5MG /DOS, 2 MG/3ML SOPN Inject 0.5 mg into the skin once a week.     traZODone  (DESYREL ) 50 MG tablet Take 1 tablet (50 mg total) by mouth at bedtime as needed for sleep. (Patient not taking: Reported on 04/27/2024) 30 tablet 1    Labs  Lab Results:  Admission on 04/27/2024  Component Date Value Ref Range Status   Hgb A1c MFr Bld 04/28/2024 6.7 (H)  4.8 - 5.6 % Final   Comment: (NOTE) Diagnosis of Diabetes The following HbA1c ranges recommended by the American Diabetes Association (ADA) may be used as an aid in the diagnosis of diabetes mellitus.  Hemoglobin             Suggested A1C NGSP%              Diagnosis  <5.7                   Non Diabetic  5.7-6.4                Pre-Diabetic  >6.4                   Diabetic  <7.0                   Glycemic control for                       adults with diabetes.     Mean Plasma Glucose 04/28/2024 145.59  mg/dL Final   Performed at [facility], [address], [place], [place] [address]   Cholesterol 04/28/2024 90  0 - 200 mg/dL Final   Comment:        ATP III CLASSIFICATION:  <200     mg/dL   Desirable  799-760  mg/dL   Borderline High  >=759    mg/dL   High           Triglycerides 04/28/2024 95  <150 mg/dL Final   HDL 98/97/7973 30 (L)  >40 mg/dL Final   Total CHOL/HDL Ratio 04/28/2024 3.0  RATIO Final   VLDL 04/28/2024 19  0 - 40 mg/dL Final   LDL Cholesterol 04/28/2024 41  0 - 99 mg/dL Final   Comment:        Total Cholesterol/HDL:CHD Risk Coronary Heart Disease Risk Table                     Men   Women  1/2 Average Risk   3.4   3.3  Average Risk       5.0   4.4  2 X Average Risk   9.6   7.1  3 X Average Risk  23.4   11.0        Use the calculated Patient Ratio above and the CHD Risk Table to determine the patient's CHD Risk.        ATP III CLASSIFICATION (LDL):  <100     mg/dL   Optimal  899-870  mg/dL   Near or Above                     Optimal  130-159  mg/dL   Borderline  839-810  mg/dL   High  >809     mg/dL   Very High Performed at [facility], [address], [place], [place] [address]    TSH 04/28/2024 2.600  0.350 - 4.500 uIU/mL Final   Performed at [facility], [address], [place], [place] [address]  Admission on 04/27/2024, Discharged on 04/27/2024  Component Date Value Ref Range Status   Sodium 04/27/2024 136  135 - 145 mmol/L Final   Electrolytes repeated to verify    Potassium 04/27/2024 3.8  3.5 - 5.1 mmol/L Final   Chloride 04/27/2024 94 (L)  98 - 111 mmol/L Final   CO2 04/27/2024 16 (L)  22 - 32 mmol/L Final   Glucose, Bld 04/27/2024 147 (H)  70 - 99 mg/dL Final   Glucose reference range applies only to samples taken after fasting for at least 8 hours.  BUN 04/27/2024 50 (H)  6 - 20 mg/dL Final   Creatinine, Ser 04/27/2024 1.69 (H)  0.61 - 1.24 mg/dL Final   Calcium 98/98/7973 10.0  8.9 - 10.3 mg/dL Final   Total Protein 98/98/7973 9.1 (H)  6.5 - 8.1 g/dL Final   Albumin 98/98/7973 4.9  3.5 - 5.0 g/dL Final   AST 98/98/7973 82 (H)  15 - 41 U/L Final   HEMOLYSIS AT THIS LEVEL MAY AFFECT RESULT   ALT 04/27/2024 38  0 - 44 U/L Final   Alkaline Phosphatase 04/27/2024 130 (H)  38 - 126 U/L Final   Total Bilirubin 04/27/2024 2.7 (H)  0.0 - 1.2 mg/dL Final   GFR, Estimated 04/27/2024 55 (L)  >60 mL/min Final   Comment: (NOTE) Calculated using the CKD-EPI Creatinine Equation (2021)    Anion gap 04/27/2024 25 (H)  5 - 15 Final   Performed at [facility], [address], [place], [place] [address]   Alcohol, Ethyl (B) 04/27/2024 <15  <15 mg/dL Final   Comment: (NOTE) For medical purposes only. Performed at [facility], [address], [place], [place] [address]    WBC 04/27/2024 18.2 (H)  4.0 - 10.5 K/uL Final   RBC 04/27/2024 5.75  4.22 - 5.81 MIL/uL Final   Hemoglobin 04/27/2024 16.1  13.0 - 17.0 g/dL Final   HCT 98/98/7973  48.5  39.0 - 52.0 % Final   MCV 04/27/2024 84.3  80.0 - 100.0 fL Final   MCH 04/27/2024 28.0  26.0 - 34.0 pg Final   MCHC 04/27/2024 33.2  30.0 - 36.0 g/dL Final   RDW 98/98/7973 12.8  11.5 - 15.5 % Final   Platelets 04/27/2024 446 (H)  150 - 400 K/uL Final   nRBC 04/27/2024 0.0  0.0 - 0.2 % Final   Performed at [facility], [address] [place], [place] [address]   Opiates 04/27/2024 NEGATIVE  NEGATIVE Final   Cocaine 04/27/2024 NEGATIVE  NEGATIVE Final   Benzodiazepines 04/27/2024 NEGATIVE  NEGATIVE Final   Amphetamines 04/27/2024 POSITIVE (A)  NEGATIVE Final   Tetrahydrocannabinol 04/27/2024 NEGATIVE  NEGATIVE Final   Barbiturates 04/27/2024 NEGATIVE  NEGATIVE Final   Methadone Scn, Ur 04/27/2024 NEGATIVE  NEGATIVE Final   Fentanyl  04/27/2024 NEGATIVE  NEGATIVE Final   Comment: (NOTE) Drug screen is for Medical Purposes only. Positive results are preliminary only. If confirmation is needed, notify lab within 5 days.  Drug Class                 Cutoff (ng/mL) Amphetamine and metabolites 1000 Barbiturate and metabolites 200 Benzodiazepine              200 Opiates and metabolites     300 Cocaine and metabolites     300 THC                         50 Fentanyl                     5 Methadone                   300  Trazodone  is metabolized in vivo to several metabolites,  including pharmacologically active m-CPP, which is excreted in the  urine.  Immunoassay screens for amphetamines and MDMA have potential  cross-reactivity with these compounds and may provide false positive  result.  Performed at [facility], [address] [place],  [place] [address]    Sodium 04/27/2024 137  135 - 145 mmol/L Final   Potassium 04/27/2024 3.8  3.5 - 5.1 mmol/L Final   Chloride 04/27/2024 98  98 - 111 mmol/L Final   CO2 04/27/2024 24  22 - 32 mmol/L Final   Glucose, Bld 04/27/2024 231 (H)  70 - 99 mg/dL Final   Glucose reference range applies only  to samples taken after fasting for at least 8 hours.   BUN 04/27/2024 36 (H)  6 - 20 mg/dL Final   Creatinine, Ser 04/27/2024 1.17  0.61 - 1.24 mg/dL Final   Calcium 98/98/7973 9.3  8.9 - 10.3 mg/dL Final   GFR, Estimated 04/27/2024 >60  >60 mL/min Final   Comment: (NOTE) Calculated using the CKD-EPI Creatinine Equation (2021)    Anion gap 04/27/2024 15  5 - 15 Final   Performed at [facility], [address], [place], [place] [address]   Specimen Source 04/27/2024 URINE, CLEAN CATCH   Final   Color, Urine 04/27/2024 YELLOW  YELLOW Final   APPearance 04/27/2024 HAZY (A)  CLEAR Final   Specific Gravity, Urine 04/27/2024 1.017  1.005 - 1.030 Final   pH 04/27/2024 5.0  5.0 - 8.0 Final   Glucose, UA 04/27/2024 NEGATIVE  NEGATIVE mg/dL Final   Hgb urine dipstick 04/27/2024 LARGE (A)  NEGATIVE Final   Bilirubin Urine 04/27/2024 NEGATIVE  NEGATIVE Final   Ketones, ur 04/27/2024 5 (A)  NEGATIVE mg/dL Final   Protein, ur 98/98/7973 100 (A)  NEGATIVE mg/dL Final   Nitrite 98/98/7973 NEGATIVE  NEGATIVE Final   Leukocytes,Ua 04/27/2024 NEGATIVE  NEGATIVE Final   RBC / HPF 04/27/2024 11-20  0 - 5 RBC/hpf Final   WBC, UA 04/27/2024 0-5  0 - 5 WBC/hpf Final   Comment:        Reflex urine culture not performed if WBC <=10, OR if Squamous epithelial cells >5. If Squamous epithelial cells >5 suggest recollection.    Bacteria, UA 04/27/2024 RARE (A)  NONE SEEN Final   Squamous Epithelial / HPF 04/27/2024 0-5  0 - 5 /HPF Final   Mucus 04/27/2024 PRESENT   Final   Hyaline Casts, UA 04/27/2024 PRESENT   Final   Performed at [facility], [address] [place], [place] [address]   Salicylate Lvl 04/27/2024 <7.0 (L)  7.0 - 30.0 mg/dL Final   Performed at [facility], [address], [place], [place] [address]   Acetaminophen  (Tylenol ), Serum 04/27/2024 <10 (L)  10 - 30 ug/mL Final   Comment: (NOTE) Toxic concentrations can be more  effectively related to post dose interval; >200, >100, and >50 ug/mL serum concentrations correspond to toxic concentrations at 4, 8, and 12 hours post dose, respectively.  Performed at [facility], [address], [place], [place] [address]   Admission on 03/22/2024, Discharged on 03/22/2024  Component Date Value Ref Range Status   Influenza A Antigen, POC 03/22/2024 Negative  Negative Final   Influenza B Antigen, POC 03/22/2024 Negative  Negative Final   Covid Antigen, POC 03/22/2024 Negative  Negative Final    Blood Alcohol level:  Lab Results  Component Value Date   [facility] <15 04/27/2024   ETH <10 01/17/2023    Metabolic Disorder Labs: Lab Results  Component Value Date   HGBA1C 6.7 (H) 04/28/2024   MPG 145.59 04/28/2024   MPG 159.94 01/17/2023   Lab Results  Component Value Date   PROLACTIN 10.1 01/17/2023  Lab Results  Component Value Date   CHOL 90 04/28/2024   TRIG 95 04/28/2024   HDL 30 (L) 04/28/2024   CHOLHDL 3.0 04/28/2024   VLDL 19 04/28/2024   LDLCALC 41 04/28/2024   LDLCALC 79 01/17/2023    Therapeutic Lab Levels: No results found for: LITHIUM Lab Results  Component Value Date   VALPROATE 62 10/16/2019   No results found for: CBMZ  Physical Findings   AIMS    Flowsheet Row Admission (Discharged) from 12/30/2021 in [facility] INPATIENT BEHAVIORAL MEDICINE Admission (Discharged) from 01/16/2021 in [facility] INPATIENT BEHAVIORAL MEDICINE [facility]) [facility]) [facility]  AIMS Total Score 0 0 0 0   AUDIT    Flowsheet Row Admission (Discharged) from 01/18/2023 in [facility] INPATIENT BEHAVIORAL MEDICINE Admission (Discharged) from 12/30/2021 in [facility] INPATIENT BEHAVIORAL MEDICINE Admission (Discharged) from 12/04/2021 in [facility] INPATIENT BEHAVIORAL MEDICINE Admission (Discharged) from 01/16/2021 in [facility] INPATIENT  BEHAVIORAL MEDICINE Ad[facility] ([facility]) [facility]  Alcohol Use Disorder Identification Test Final Score (AUDIT) 1 2 2 1  34   PHQ2-9    Flowsheet Row ED from 04/27/2024 in [facility] Emergency Department at [facility] ED from 04/14/2021 in [facility] Emergency Department at [facility] ED from 02/05/2021 in [facility] ED from 01/14/2021 in [facility] Emergency Department at [facility] ED from 11/10/2020 in [facility] Emergency Department at [facility]  PHQ-2 Total Score 3 2 4 2 4   PHQ-9 Total Score 11 16 9 6 8    Flowsheet Row ED from 04/27/2024 in [facility] Most recent reading at 04/27/2024  4:30 PM ED from 04/27/2024 in [facility] Emergency Department at [facility] Most recent reading at 04/27/2024  5:26 AM UC from 03/22/2024 in [facility] [place] Most recent reading at 03/22/2024 11:58 AM  C-SSRS RISK CATEGORY High Risk No Risk No Risk     Musculoskeletal  Strength & Muscle Tone: within normal limits Gait & Station: normal Patient leans: N/A  Psychiatric Specialty Exam  Presentation  General Appearance: Fairly Groomed  Eye Contact:Good  Speech:Clear and Coherent; Normal Rate  Speech Volume:Normal  Handedness:Right   Mood and Affect  Mood:Angry  Affect:Congruent   Thought Process  Thought Processes:Coherent  Descriptions of Associations:Intact  Orientation:Full (Time, Place and Person)  Thought Content:Logical  Diagnosis of Schizophrenia or Schizoaffective disorder in past: No    Hallucinations:Hallucinations: None  Ideas of Reference:None  Suicidal Thoughts:Suicidal Thoughts: No  Homicidal Thoughts:Homicidal Thoughts: No   Sensorium  Memory:Immediate Good; Recent Good  Judgment:Good  Insight:Good   Executive Functions  Concentration:Good  Attention  Span:Good  Recall:Good  Fund of Knowledge:Good  Language:Good   Psychomotor Activity  Psychomotor Activity:Psychomotor Activity: Normal   Assets  Assets:Desire for Improvement; Resilience   Sleep  Sleep:Sleep: Fair  Estimated Sleeping Duration (Last 24 Hours): 8.50-10.50 hours  No data recorded  Physical Exam  Physical Exam Vitals and nursing note reviewed.  HENT:     Head: Normocephalic.     Mouth/Throat:     Mouth: Mucous membranes are moist.  Cardiovascular:     Rate and Rhythm: Normal rate.  Pulmonary:     Effort: Pulmonary effort is normal.  Musculoskeletal:        General: Normal range of motion.     Comments: C/o bilateral foot pain. Soiled feet.  Positive dorsalis pedis pulses bilat. No open wounds to soles of feet. Tenderness to palpation to fifth digit of right foot.   Skin:    General: Skin is warm and dry.  Neurological:     Mental Status: He is alert and oriented to person, place, and time.  Psychiatric:     Comments: See HPI    Review of Systems  Constitutional:  Negative for chills and fever.  HENT:  Negative for congestion and sore throat.   Respiratory:  Negative for cough and shortness of breath.   Cardiovascular:  Negative for chest pain and palpitations.  Musculoskeletal:        C/o bilateral foot pain.  Psychiatric/Behavioral:  Positive for substance abuse and suicidal ideas. Negative for depression and hallucinations. The patient is nervous/anxious.    Blood pressure 112/71, pulse 78, temperature 97.7 F (36.5 C), temperature source Oral, resp. rate 20, SpO2 97%. There is no height or weight on file to calculate BMI.  Long Term Goals: Improvement in symptoms so as ready for discharge and coordinate outpatient substance use treatment.   Short Term Goals: Patient will verbalize feelings in meetings with treatment team members., Patient will attend at least of 50% of the groups daily., Pt will complete the PHQ9 on admission, day 3 and  discharge., Patient will participate in completing the [place] Suicide Severity Rating Scale, Patient will score a low risk of violence for 24 hours prior to discharge, and Patient will take medications as prescribed daily.  Medical Decision Making  34 yo male presents with methamphetamine use disorder with dysregulation in mood and behavior, limited insight, and ineffective coping.   Medication Management CBG monitoring BID Continue agitation protocol Continue hydroxyzine  25 mg PO TID prn anxiety Continue Lantus 15 units SQ daily Continue Seroquel  150 mg PO at bedtime Continue Tylenol  650 mg PO Q6H  prn mild pain Continue Maalox/Mylanta 30 ml Q4H prn indigestion Continue MOM 30 mg daily prn constipation   Recommendations  Based on my evaluation the patient does not appear to have an emergency medical condition. Continue admission on facility based crisis.   Care management to facilitate placement in residential substance use treatment.   Sherrell Culver, PMHNP-BC, FNP-BC  04/28/2024 11:33 AM

## 2024-04-28 NOTE — ED Notes (Addendum)
 Writer notified provider via secure chat of pt's BS 240. Awaiting new orders. Pt re-educated on importance of limiting sugar intake. Pt verbalized understanding and agreement. Will continue to monitor for safety.

## 2024-04-28 NOTE — Group Note (Signed)
 Group Topic: Communication  Group Date: 04/28/2024 Start Time: 1200 End Time: 1230 Facilitators: Deidre Prentis CROME, NT  Department: [facility]  Number of Participants: 3  Group Focus: communication Treatment Modality:  Psychoeducation Interventions utilized were patient education Purpose: improve communication skills  Name: Nathan Koch Date of Birth: June 25, 1990  MR: [id number]    Level of Participation: active Quality of Participation: attentive Interactions with others: gave feedback Mood/Affect: appropriate Triggers (if applicable): N/A Cognition: coherent/clear Progress: Gaining insight Response: Pt engaged appropriately, was receptive to psycho education, and demonstrated insight. 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   Self-inflicted laceration of left wrist ([name]) 01/17/2021   Severe recurrent major depression without psychotic features ([name]) 01/16/2021   Bipolar 1 disorder, depressed ([name])    MDD (major depressive disorder), recurrent episode, severe ([name]) 09/11/2020   MDD (major depressive disorder), recurrent severe, without psychosis ([name]) 09/10/2020   Suicidal ideation    Depression, major, recurrent, 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   MDD (major depressive disorder), recurrent, severe, with psychosis ([name]) 09/14/2019   MDD (major depressive disorder), single episode, severe with psychosis ([name])  09/14/2019   Tobacco use disorder 02/21/2019   PTSD (post-traumatic stress disorder) 02/21/2019   Benzodiazepine abuse ([name]) 11/15/2017   Asthma 06/26/2014

## 2024-04-28 NOTE — ED Notes (Signed)
 Patient A&Ox4. Denies intent to harm self/others when asked. Denies A/VH. Patient denies any physical complaints when asked. No acute distress noted. Support and encouragement provided. Routine safety checks conducted according to facility protocol. Encouraged patient to notify staff if thoughts of harm toward self or others arise. Patient verbalize understanding and agreement. Will continue to monitor for safety.

## 2024-04-28 NOTE — ED Notes (Signed)
 Pt CBG 238. Provider made aware. Pt received Lantus 15 units to L lower abd without difficulty. Safety maintained.

## 2024-04-28 NOTE — ED Notes (Signed)
 Pt denies SI/HI/AVH. No reports of pain. Compliant with scheduled meds. He was calm, cooperative, and pleasant. No behavioral concerns at this time. Pt sleeping in bed, no acute distress noted. Respirations even and unlabored. Continue to monitor for safety.

## 2024-04-28 NOTE — ED Notes (Signed)
 Pt sitting in dayroom watching television and eating lunch. No acute distress noted. No concerns voiced. Informed pt to notify staff with any needs or assistance. Pt verbalized understanding and agreement. Will continue to monitor for safety.

## 2024-04-28 NOTE — ED Notes (Signed)
 Provider on unit talking with pt concerning blood sugars. Will continue to monitor for safety.

## 2024-04-28 NOTE — ED Notes (Signed)
 Pt received Lantus 15 units to L lower abd without difficulty. Pt educated on diet low in carbs/sugar to manage BS. BS 238. Pt denies concerns and verbalize understanding of education provided. Will continue to monitor for safety.

## 2024-04-28 NOTE — ED Notes (Signed)
 Pt sleeping long intervals through night, without complaints of discomfort or distress.  Safety maintained.

## 2024-04-28 NOTE — Group Note (Signed)
 Group Topic: Communication  Group Date: 04/28/2024 Start Time: 0910 End Time: 0930 Facilitators: Herold Lajuana NOVAK, RN  Department: [facility]  Number of Participants: 3  Group Focus: communication Treatment Modality:  Leisure Development Interventions utilized were patient education Purpose: increase insight  Name: Nathan Koch Date of Birth: 1990/11/29  MR: [id number]    Level of Participation: active Quality of Participation: attention seeking Interactions with others: gave feedback Mood/Affect: appropriate Triggers (if applicable): none identified Cognition: coherent/clear Progress: Gaining insight Response: pt verbalized understanding and indication for all medications received this am Plan: patient will be encouraged to remain compliant with provider recommendations for tx and ask for assistance with any needs or concerns  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   Self-inflicted laceration of left wrist ([name]) 01/17/2021   Severe recurrent major depression without psychotic features ([name]) 01/16/2021   Bipolar 1 disorder, depressed ([name])    MDD (major depressive disorder), recurrent episode, severe ([name]) 09/11/2020   MDD (major depressive disorder), recurrent severe, without psychosis ([name]) 09/10/2020   Suicidal ideation    Depression, major, recurrent, 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   MDD (major depressive disorder), recurrent, severe,  with psychosis ([name]) 09/14/2019   MDD (major depressive disorder), single episode, severe with psychosis ([name]) 09/14/2019   Tobacco use disorder 02/21/2019   PTSD (post-traumatic stress disorder) 02/21/2019   Benzodiazepine abuse ([name]) 11/15/2017   Asthma 06/26/2014

## 2024-04-28 NOTE — Care Management (Addendum)
 [facility] Care Management...  Addendum 3:33 pm  Writer faxed referral to [facility]  Addendum 12:59 pm  Writer spoke with [facility]  They advised that they will not have any available beds until mid/late January   Writer met with patient to discuss discharge planning  Patient report relapse using meth and was removed from [facility]. Patient reported living their for about a month. Patient reported that he is looking for inpatient treatment. Patient reported having family in [place]. Patient reported intellectual disability, things may need to be repeated.   Patient has [employer]  Writer will try [facility] and [employer] left message for Keri at [employer] for return call

## 2024-04-29 DIAGNOSIS — R7309 Other abnormal glucose: Secondary | ICD-10-CM | POA: Diagnosis not present

## 2024-04-29 DIAGNOSIS — F1510 Other stimulant abuse, uncomplicated: Secondary | ICD-10-CM | POA: Diagnosis not present

## 2024-04-29 LAB — GLUCOSE, CAPILLARY
Glucose-Capillary: 221 mg/dL — ABNORMAL HIGH (ref 70–99)
Glucose-Capillary: 209 mg/dL — ABNORMAL HIGH (ref 70–99)
Glucose-Capillary: 213 mg/dL — ABNORMAL HIGH (ref 70–99)

## 2024-04-29 MED ORDER — INSULIN ASPART 100 UNIT/ML IJ SOLN
4.0000 [IU] | Freq: Three times a day (TID) | INTRAMUSCULAR | Status: DC
  Administered 2024-04-29 – 2024-05-02 (×8): 4 [IU] via SUBCUTANEOUS
  Filled 2024-04-29: qty 0.04

## 2024-04-29 MED ORDER — INSULIN ASPART 100 UNIT/ML IJ SOLN
0.0000 [IU] | Freq: Three times a day (TID) | INTRAMUSCULAR | Status: DC
  Administered 2024-04-30 (×3): 3 [IU] via SUBCUTANEOUS
  Administered 2024-05-01: 2 [IU] via SUBCUTANEOUS
  Administered 2024-05-02: 3 [IU] via SUBCUTANEOUS
  Filled 2024-04-29: qty 0.15

## 2024-04-29 MED ORDER — INSULIN ASPART 100 UNIT/ML IJ SOLN
0.0000 [IU] | Freq: Every day | INTRAMUSCULAR | Status: DC
  Administered 2024-04-30: 2 [IU] via SUBCUTANEOUS
  Administered 2024-05-01: 0 [IU] via SUBCUTANEOUS
  Filled 2024-04-29: qty 0.05

## 2024-04-29 MED ORDER — BUPROPION HCL ER (XL) 150 MG PO TB24
150.0000 mg | ORAL_TABLET | Freq: Every day | ORAL | Status: DC
  Administered 2024-04-29 – 2024-05-02 (×4): 150 mg via ORAL
  Filled 2024-04-29: qty 1
  Filled 2024-04-29: qty 7
  Filled 2024-04-29 (×3): qty 1

## 2024-04-29 NOTE — Discharge Instructions (Addendum)
 [facility] Care Management...  Patient requested inpatient treatment  Per Levorn at [facility] patient has been accepted  Patient agreed to this inpatient treatment placement  Patient will discharge Tuesday 05/02/2024 by 11 am  [employer] is a registered [employer] [address] [employer] [facility]  [address] [place], [place] [address] Office phone [phone]   RN to arrange transportation Acupuncturist)  7 day samples 30 day scripts

## 2024-04-29 NOTE — ED Notes (Signed)
 Pt denies SI/HI/AVH. No reports of pain. Compliant with scheduled meds. He was calm, cooperative, depressed, and pleasant. No behavioral concerns at this time. Pt attended AA group this evening. Pt currently sleeping in bed, no acute distress noted. Respirations even and unlabored. Continue to monitor for safety.

## 2024-04-29 NOTE — ED Notes (Signed)
 Pt sleeping in bed, no acute distress noted. Respirations even and unlabored. Continue to monitor for safety.

## 2024-04-29 NOTE — Group Note (Signed)
 Group Topic: Recovery Basics  Group Date: 04/29/2024 Start Time: 8069 End Time: 2000 Facilitators: Verdon Jacqualyn BRAVO, NT  Department: [facility]  Number of Participants: 2  Group Focus: coping skills Treatment Modality:  Individual Therapy Interventions utilized were group exercise Purpose: relapse prevention strategies  Name: Nathan Koch Date of Birth: 26-Jul-1990  MR: [id number]    Level of Participation: active Quality of Participation: cooperative Interactions with others: gave feedback Mood/Affect: appropriate Triggers (if applicable): n/a Cognition: coherent/clear Progress: Moderate Response: n/a 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   Self-inflicted laceration of left wrist ([name]) 01/17/2021   Severe recurrent major depression without psychotic features ([name]) 01/16/2021   Bipolar 1 disorder, depressed ([name])    MDD (major depressive disorder), recurrent episode, severe ([name]) 09/11/2020   MDD (major depressive disorder), recurrent severe, without psychosis ([name]) 09/10/2020   Suicidal ideation    Depression, major, recurrent, 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   MDD (major depressive disorder), recurrent, severe, with psychosis ([name]) 09/14/2019   MDD (major depressive disorder), single episode, severe with psychosis ([name]) 09/14/2019   Tobacco use disorder 02/21/2019   PTSD (post-traumatic stress disorder)  02/21/2019   Benzodiazepine abuse ([name]) 11/15/2017   Asthma 06/26/2014

## 2024-04-29 NOTE — ED Notes (Signed)
 Pt ate 100% of his dinner.  SSI for FSBS 213=5 U Novolog  + scheduled 4 units with each meal given as per orders.  Pt educated on dosing. Verbalized understanding.  He prefers to give himself his insulin  injection in his abdomen.

## 2024-04-29 NOTE — ED Notes (Signed)
 Pt continues to rest or sleep in his room this AM.  FSBS completed and SSI given as per orders.   BP recheck is still low ;however, he's been in bed all morning.  Denies dizziness.  Encouraged to drink fluids.

## 2024-04-29 NOTE — ED Notes (Signed)
 Pt sitting in dayroom watching TV and interacting with peer. No acute distress noted. Continue to monitor for safety.

## 2024-04-29 NOTE — ED Notes (Signed)
 Pt awakened for AM VS and FSBS.  Pt is sleepy but awakens easily.   Pleasant and cooperative.  FSBS =221  Covered with 5 units Novolog  as per SS orders.  Pt denies SI/HI/AVH.  Reports having some mild depression.  States that he hasn't been sleeping well recently and just feels tired   Encouraged to eat breakfast and he is up doing so at this time.  Denies any difficulty with bowels or bladder but can't remember last BM. Denies needing anything for constipation.

## 2024-04-29 NOTE — Group Note (Signed)
 Group Topic: Recovery Basics  Group Date: 04/29/2024 Start Time: 1720 End Time: 1745 Facilitators: Vincente Asberry CROME, RN  Department: [facility]  Number of Participants: 2  Group Focus: anxiety Treatment Modality:  Skills Training Interventions utilized were support Purpose: enhance coping skills  Name: Nathan Koch Date of Birth: 09-03-1990  MR: [id number]    Level of Participation: active Quality of Participation: attentive Interactions with others: gave feedback Mood/Affect: appropriate Triggers (if applicable):  Cognition: coherent/clear Progress: Moderate Response:  Verbalized understanding  Plan: patient will be encouraged to continue to report level of anxiety, report effectiveness of medications PRN and use coping skills.   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   Self-inflicted laceration of left wrist ([name]) 01/17/2021   Severe recurrent major depression without psychotic features ([name]) 01/16/2021   Bipolar 1 disorder, depressed ([name])    MDD (major depressive disorder), recurrent episode, severe ([name]) 09/11/2020   MDD (major depressive disorder), recurrent severe, without psychosis ([name]) 09/10/2020   Suicidal ideation    Depression, major, recurrent, 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   MDD (major depressive disorder), recurrent, severe, with psychosis ([name]) 09/14/2019   MDD (major depressive disorder), single episode, severe  with psychosis ([name]) 09/14/2019   Tobacco use disorder 02/21/2019   PTSD (post-traumatic stress disorder) 02/21/2019   Benzodiazepine abuse ([name]) 11/15/2017   Asthma 06/26/2014

## 2024-04-29 NOTE — ED Notes (Signed)
 Pt started on Wellbutrin .  Pt instructed on new medication.  Verbalized understanding.  BP running low today.  Last check 99/64.  Provider notified by confidential chat.  Pt is asymptomatic.   Enc fluids.

## 2024-04-29 NOTE — Group Note (Signed)
 Group Topic: Overcoming Obstacles  Group Date: 04/29/2024 Start Time: 0100 End Time: 0130 Facilitators: Lonzell Dwayne RAMAN, NT  Department: [facility]  Number of Participants: 2  Group Focus: chemical dependency education Treatment Modality:  Individual Therapy Interventions utilized were problem solving Purpose: enhance coping skills  Name: Nathan Koch Date of Birth: May 19, 1990  MR: [id number]    Level of Participation: Patient attended group Quality of Participation: attentive Interactions with others: gave feedback Mood/Affect: positive Triggers (if applicable): N/A Cognition: coherent/clear Progress: Minimal Response: Appropriate 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   Self-inflicted laceration of left wrist ([name]) 01/17/2021   Severe recurrent major depression without psychotic features ([name]) 01/16/2021   Bipolar 1 disorder, depressed ([name])    MDD (major depressive disorder), recurrent episode, severe ([name]) 09/11/2020   MDD (major depressive disorder), recurrent severe, without psychosis ([name]) 09/10/2020   Suicidal ideation    Depression, major, recurrent, 

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
## 2024-04-30 DIAGNOSIS — R7309 Other abnormal glucose: Secondary | ICD-10-CM | POA: Diagnosis not present

## 2024-04-30 LAB — GLUCOSE, CAPILLARY
Glucose-Capillary: 164 mg/dL — ABNORMAL HIGH (ref 70–99)
Glucose-Capillary: 175 mg/dL — ABNORMAL HIGH (ref 70–99)
Glucose-Capillary: 221 mg/dL — ABNORMAL HIGH (ref 70–99)

## 2024-04-30 NOTE — ED Notes (Signed)
 Pt sleeping in bed, no acute distress noted. Respirations even and unlabored. Continue to monitor for safety.

## 2024-04-30 NOTE — Group Note (Signed)
 Group Topic: Communication  Group Date: 04/30/2024 Start Time: 1200 End Time: 1230 Facilitators: Reinhold Minor BROCKS, RN  Department: [facility]  Number of Participants: 1  Group Focus: communication Treatment Modality:  Psychoeducation Interventions utilized were patient education Purpose: enhance coping skills  Name: Nathan Koch Date of Birth: [birth year]  MR: [id number]    Level of Participation: active Quality of Participation: cooperative Interactions with others: gave feedback Mood/Affect: positive Triggers (if applicable):  Cognition: goal directed Progress: Minimal Response:  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   Self-inflicted laceration of left wrist ([name]) 01/17/2021   Severe recurrent major depression without psychotic features ([name]) 01/16/2021   Bipolar 1 disorder, depressed ([name])    MDD (major depressive disorder), recurrent episode, severe ([name]) 09/11/2020   MDD (major depressive disorder), recurrent severe, without psychosis ([name]) 09/10/2020   Suicidal ideation    Depression, major, recurrent, 

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
## 2024-04-30 NOTE — ED Notes (Signed)
 Patient resting in bed with eyes closed, respirations even and unlabored. Patient in no apparent acute distress. Environment secured. Safety checks in place per facility protocol.

## 2024-04-30 NOTE — ED Notes (Signed)
 Patient alert & oriented x4. Pt on unit throughout shift q15 checks completed. Pt denies intent to harm self or others. Denies AVH. No signs of acute distress noted. No inappropriate behaviors observed or reported. Encouraged patient to notify staff if any thoughts of harm towards self or others arise. Patient verbalizes understanding and agreement. Eating and fluid intake were adequate. Medications administered as ordered; no adverse effects noted.

## 2024-04-30 NOTE — Group Note (Signed)
 Group Topic: Positive Affirmations  Group Date: 04/30/2024 Start Time: 0130 End Time: 0135 Facilitators: Lonzell Dwayne RAMAN, NT  Department: [facility]  Number of Participants: 1  Group Focus: clarity of thought Treatment Modality:  Individual Therapy Interventions utilized were patient education Purpose: reinforce self-care  Name: Nathan Koch Date of Birth: Nov 12, 1990  MR: [id number]    Level of Participation: Patient did not attend group Quality of Participation: N/A Interactions with others: N/A Mood/Affect: N/A Triggers (if applicable): N/A Cognition: N/A Progress: N/A Response: N/A Plan: N/A  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   Self-inflicted laceration of left wrist ([name]) 01/17/2021   Severe recurrent major depression without psychotic features ([name]) 01/16/2021   Bipolar 1 disorder, depressed ([name])    MDD (major depressive disorder), recurrent episode, severe ([name]) 09/11/2020   MDD (major depressive disorder), recurrent severe, without psychosis ([name]) 09/10/2020   Suicidal ideation    Depression, major, recurrent, 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

## 2024-04-30 NOTE — ED Notes (Signed)
 Pt denies SI/HI/AVH. No reports of pain. Compliant with scheduled meds. He was calm, cooperative, depressed, and pleasant. No behavioral concerns at this time. Pt sleeping in bed, no acute distress noted. Respirations even and unlabored. Continue to monitor for safety.

## 2024-04-30 NOTE — ED Notes (Signed)
 Pt sitting in dayroom watching TV. No acute distress noted. Continue to monitor for safety.

## 2024-05-01 DIAGNOSIS — R7309 Other abnormal glucose: Secondary | ICD-10-CM | POA: Diagnosis not present

## 2024-05-01 LAB — CBC
HCT: 41.5 % (ref 39.0–52.0)
Hemoglobin: 13.8 g/dL (ref 13.0–17.0)
MCH: 28.2 pg (ref 26.0–34.0)
MCHC: 33.3 g/dL (ref 30.0–36.0)
MCV: 84.7 fL (ref 80.0–100.0)
Platelets: 359 K/uL (ref 150–400)
RBC: 4.9 MIL/uL (ref 4.22–5.81)
RDW: 12.9 % (ref 11.5–15.5)
WBC: 6.3 K/uL (ref 4.0–10.5)
nRBC: 0 % (ref 0.0–0.2)

## 2024-05-01 LAB — GLUCOSE, CAPILLARY
Glucose-Capillary: 151 mg/dL — ABNORMAL HIGH (ref 70–99)
Glucose-Capillary: 155 mg/dL — ABNORMAL HIGH (ref 70–99)
Glucose-Capillary: 162 mg/dL — ABNORMAL HIGH (ref 70–99)
Glucose-Capillary: 105 mg/dL — ABNORMAL HIGH (ref 70–99)
Glucose-Capillary: 142 mg/dL — ABNORMAL HIGH (ref 70–99)
Glucose-Capillary: 147 mg/dL — ABNORMAL HIGH (ref 70–99)

## 2024-05-01 LAB — VITAMIN B12: Vitamin B-12: 476 pg/mL (ref 180–914)

## 2024-05-01 LAB — VITAMIN D 25 HYDROXY (VIT D DEFICIENCY, FRACTURES): Vit D, 25-Hydroxy: 18.4 ng/mL — ABNORMAL LOW (ref 30–100)

## 2024-05-01 MED ORDER — BUPROPION HCL ER (XL) 150 MG PO TB24: 150.0000 mg | ORAL_TABLET | Freq: Every day | ORAL | 0 refills | Status: AC

## 2024-05-01 MED ORDER — INSULIN ASPART 100 UNIT/ML IJ SOLN: 4.0000 [IU] | Freq: Three times a day (TID) | INTRAMUSCULAR | Status: AC

## 2024-05-01 MED ORDER — INSULIN GLARGINE 100 UNIT/ML ~~LOC~~ SOLN: 15.0000 [IU] | Freq: Every day | SUBCUTANEOUS | 0 refills | Status: AC

## 2024-05-01 MED ORDER — QUETIAPINE FUMARATE 150 MG PO TABS: 150.0000 mg | ORAL_TABLET | Freq: Every day | ORAL | 0 refills | Status: AC

## 2024-05-01 MED ORDER — QUETIAPINE FUMARATE 50 MG PO TABS
150.0000 mg | ORAL_TABLET | Freq: Every day | ORAL | Status: DC
  Filled 2024-05-01: qty 21

## 2024-05-01 MED ORDER — ALBUTEROL SULFATE HFA 108 (90 BASE) MCG/ACT IN AERS: 2.0000 | INHALATION_SPRAY | Freq: Four times a day (QID) | RESPIRATORY_TRACT | 0 refills | Status: AC | PRN

## 2024-05-01 NOTE — ED Notes (Signed)
 Pt ate 50% of his lunch.  4 units scheduled novolog  insulin  given as per order.

## 2024-05-01 NOTE — ED Notes (Signed)
 Pt sleeping at present, no distress noted.  Monitoring for safety.

## 2024-05-01 NOTE — Group Note (Signed)
 Group Topic: Feelings about Diagnosis  Group Date: 05/01/2024 Start Time: 1330 End Time: 1345 Facilitators: Vincente Asberry CROME, RN  Department: [facility]  Number of Participants: 3  Group Focus: communication Treatment Modality:  Skills Training Interventions utilized were patient education Purpose: improve communication skills  Name: Nathan Koch Date of Birth: 1990/10/06  MR: [id number]    Level of Participation: moderate Quality of Participation: cooperative Interactions with others: gave feedback Mood/Affect: appropriate Triggers (if applicable):  Cognition: coherent/clear Progress: Moderate Response:  Plan: changes to discharge plan  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   Self-inflicted laceration of left wrist ([name]) 01/17/2021   Severe recurrent major depression without psychotic features ([name]) 01/16/2021   Bipolar 1 disorder, depressed ([name])    MDD (major depressive disorder), recurrent episode, severe ([name]) 09/11/2020   MDD (major depressive disorder), recurrent severe, without psychosis ([name]) 09/10/2020   Suicidal ideation    Depression, major, recurrent, 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   MDD (major depressive disorder), recurrent, severe, with psychosis ([name]) 09/14/2019   MDD (major depressive disorder), single episode, severe with psychosis ([name]) 09/14/2019   Tobacco use disorder 02/21/2019   PTSD (post-traumatic stress  disorder) 02/21/2019   Benzodiazepine abuse ([name]) 11/15/2017   Asthma 06/26/2014

## 2024-05-01 NOTE — Group Note (Signed)
 Group Topic: Wellness  Group Date: 05/01/2024 Start Time: 1225 End Time: 1255 Facilitators: Leron Charolette CROME, RN  Department: [facility]  Number of Participants: 3  Group Focus: goals/reality orientation and other seven dimensions of wellness  Treatment Modality:  Skills Training and Solution-Focused Therapy Interventions utilized were support Purpose: increase insight  Name: Nathan Koch Date of Birth: June 10, 1990  MR: [id number]    Level of Participation: active Quality of Participation: cooperative Interactions with others: gave feedback Mood/Affect: appropriate Triggers (if applicable): n/a Cognition: insightful Progress: Moderate Response: progressive  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   Self-inflicted laceration of left wrist ([name]) 01/17/2021   Severe recurrent major depression without psychotic features ([name]) 01/16/2021   Bipolar 1 disorder, depressed ([name])    MDD (major depressive disorder), recurrent episode, severe ([name]) 09/11/2020   MDD (major depressive disorder), recurrent severe, without psychosis ([name]) 09/10/2020   Suicidal ideation    Depression, major, recurrent, 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   MDD (major depressive disorder), recurrent, severe, with psychosis ([name]) 09/14/2019   MDD (major depressive disorder), single episode, severe with psychosis ([name]) 09/14/2019   Tobacco use disorder  02/21/2019   PTSD (post-traumatic stress disorder) 02/21/2019   Benzodiazepine abuse ([name]) 11/15/2017   Asthma 06/26/2014

## 2024-05-01 NOTE — Group Note (Signed)
 Group Topic: Relapse and Recovery  Group Date: 05/01/2024 Start Time: 1100 End Time: 1200 Facilitators: Micheil Klaus, LCSW  Department: [facility]  Number of Participants: 1  Group Focus: chemical dependency education, chemical dependency issues, communication, and coping skills Treatment Modality:  Cognitive Behavioral Therapy Interventions utilized were patient education, problem solving, and support Purpose: enhance coping skills, regain self-worth, reinforce self-care, relapse prevention strategies, and trigger / craving management  Writer met with the Client to discuss his current substance use and his triggers.  Writer explored with the client 3 goals he successfully completed last year and three goals he did not complete.  Writer utilized CBT to explore how substance use played a factor in his goals and what triggered his substance use.   Name: Nathan Koch Date of Birth: [birth year]  MR: [id number]    Level of Participation: active Quality of Participation: attentive, cooperative, and drowsy Interactions with others: gave feedback Mood/Affect: brightens with interaction, flat, and restless Triggers (if applicable): Stress, finances, marriage Cognition: coherent/clear, goal directed, and logical Progress: Moderate Response: Client was open during group despite him being the only participant.  Client reports wanting to go inpatient tomorrow to get more stability.  Plan: follow-up as needed.   Client will attend groups and discharge to inpatient unit.   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   Rhabdomyolysis 11/30/2021   AKI (acute kidney injury) 11/30/2021   Methamphetamine intoxication ([name]) 11/30/2021   Elevated LFTs 11/30/2021   Passive suicidal ideations 04/16/2021   Homelessness  04/16/2021   Substance induced mood disorder ([name]) 04/16/2021   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   Self-inflicted laceration of left wrist ([name]) 01/17/2021   Severe recurrent major depression without psychotic features ([name]) 01/16/2021   Bipolar 1 disorder, depressed ([name])    MDD (major depressive disorder), recurrent episode, severe ([name]) 09/11/2020   MDD (major depressive disorder), recurrent severe, without psychosis ([name]) 09/10/2020   Suicidal ideation    Depression, major, recurrent, 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   MDD (major depressive disorder), recurrent, severe, with psychosis ([name]) 09/14/2019   MDD (major depressive disorder), single episode, severe with psychosis ([name]) 09/14/2019   Tobacco use disorder 02/21/2019   PTSD (post-traumatic stress disorder) 02/21/2019   Benzodiazepine abuse ([name]) 11/15/2017   Asthma 06/26/2014

## 2024-05-01 NOTE — ED Provider Notes (Signed)
 Facility Based Crisis [facility]) Progress Note  Date and Time: 05/01/2024 9:40 AM Name: Nathan Koch MRN:  [id number]  HPI: Nathan Koch is a 34 y.o. male with a mental health history of Bipolar disorder, MDD & PTSD who initially presented to the [facility] ER with complaints of suicidal ideations with plans to either cut himself or jump in front of traffic in the context of recently relapsing on Methamphetamines. He reported risk factors including 2-3 prior suicide attempts, homelessness, & family conflict.   As per ER documentation: Initial labs showed AKI with a creatinine of 1.7 from baseline normal as well as an anion gap of 25. The patient had urine, Tylenol  and salicylates added on and will be started on IV fluids. He is currently asymptomatic at this time.SABRASABRASABRARepeat labs normalized. Patient is medically cleared. Accepted to inpatient psych by Dr. Cole at facility based crisis center. Arlette, [place], DO, 04/27/24 1337).  Patient was transferred to the [facility] on the same day (04/27/2024) for treatment & stabilization of his mental status, & assistance regaining & maintaining his sobriety.  24 hr chart Review: Sleep Hours last night: reports that Seroquel  was helpful for sleep Nursing Concerns:none reported/noted Behavioral episodes in the past 24 ymd:wnwz  Medication Compliance: Compliant  Vital Signs in the past 24 hrs: WNL PRN Medications in the past 24 hrs: none   Patient assessment, 05/02/2023: On assessment today, the pt reports that their mood is still depressed, but there is some improvements as compared to time of admission.  Reports that anxiety is also improved when compared to time of admission.  Sleep is good on the Seroquel  150 mg nightly. We discussed keeping the dose same since medication is currently effective. Appetite is fair per patient. Concentration is good.  Energy level is fair, but improved when  compared to time of presentation to the facility as per pt's reports. Reports passive suicidal thoughts. Denies suicidal intent and plan. Shares that if he were t go to sleep and never wake up, he would ne at peace with it, but states that he does not intend to do anything to harm himself. Denies having any HI.  Denies having psychotic symptoms. Specifically denies AVH, denies paranoia, denies delusional thinking and denies first rank symptoms. Denies having side effects to current psychiatric medications.   We discussed making no changes to current medication regimen, but to keep current medications same with no changes. Pt denies a history of seizures, shares that he has a history of using alcohol, but does not currently use any alcohol. Education provided on this medication including the fact that it can diminish his seizure threshold if he uses alcohol and takes the medication. He verbalized understanding.  Discussed the following psychosocial stressors: recent relapse on Meth, and we talked about coordinating with CSW to pinpoint a rehab facility, and pt states that he is already doing this. Writer discussed with pt, the referrals that CSW has already placed for him ([facility]), and he is continuing to verbalize his willingness for rehab at this time.    Labs Reviewed: CBC was 18.2 on 01/01, repeating it to ensure that it was trended downwards. Ordered B12, Vit D, since diminished levels might be playing a role in depressive symptoms.   Tentative discharge date is to be determined based on acceptance into residential treatment facilities. We are continuing to be of the opinion that pt continues to need treatment and stabilization  of his mental status prior to transferring to a residential treatment facility. We are also of the opinion that it is important to foster a door to door transfer so as to mitigate the risk of relapsing.  Diagnosis:  Final diagnoses:  Methamphetamine abuse  ([name])  Type 2 diabetes mellitus with hyperglycemia, with long-term current use of insulin  ([name])  Methamphetamine-induced mood disorder ([name])   Past Psychiatric History: Bipolar disorder, MDD, PTSD Past Medical History: Asthma (06/2014), Rhabdomyolysis (11/2021), Acute kidney injury (11/2021); self-inflicted laceration Left wrist (12/2020), benzodiazepine abuse (10/2017) Family History: family history includes Asthma in his brother and mother; Cancer in his father and mother; Hypertension in his father; Ulcers in his mother.  Family Psychiatric  History: Mother: Crack cocaine addiction; Father: alcohol use; Older Brother: deceased in 2011-06-06 from heroin OD Social History: Unhoused. Never been married. Christian. Has one daughter (26 yo) that lives with her mother. Denies current legal issues.  Trauma: Severe childhood neglect due to parent's substance use; found brother overdosing and attempted with revive without success Substance Use: Methamphetamine: half gram with last use 12/31 Access to Weapons: Denies  Sleep: Good  Appetite:  Good  Current Medications:  Current Facility-Administered Medications  Medication Dose Route Frequency Provider Last Rate Last Admin   acetaminophen  (TYLENOL ) tablet 650 mg  650 mg Oral Q6H PRN White, Patrice L, NP       alum & mag hydroxide-simeth (MAALOX/MYLANTA) 200-200-20 MG/5ML suspension 30 mL  30 mL Oral Q4H PRN White, Patrice L, NP       buPROPion  (WELLBUTRIN  XL) 24 hr tablet 150 mg  150 mg Oral Daily Hobson, Fran E, NP   150 mg at 05/01/24 9051   haloperidol  (HALDOL ) tablet 5 mg  5 mg Oral TID PRN White, Patrice L, NP       And   diphenhydrAMINE  (BENADRYL ) capsule 50 mg  50 mg Oral TID PRN White, Patrice L, NP       haloperidol  lactate (HALDOL ) injection 5 mg  5 mg Intramuscular TID PRN White, Patrice L, NP       And   diphenhydrAMINE  (BENADRYL ) injection 50 mg  50 mg Intramuscular TID PRN White, Patrice L, NP       And   LORazepam  (ATIVAN ) injection 2 mg  2  mg Intramuscular TID PRN White, Patrice L, NP       haloperidol  lactate (HALDOL ) injection 10 mg  10 mg Intramuscular TID PRN White, Patrice L, NP       And   diphenhydrAMINE  (BENADRYL ) injection 50 mg  50 mg Intramuscular TID PRN White, Patrice L, NP       And   LORazepam  (ATIVAN ) injection 2 mg  2 mg Intramuscular TID PRN White, Patrice L, NP       hydrOXYzine  (ATARAX ) tablet 25 mg  25 mg Oral TID PRN White, Patrice L, NP       insulin  aspart (novoLOG ) injection 0-15 Units  0-15 Units Subcutaneous TID WC Hobson, Fran E, NP   3 Units at 05/01/24 0817   insulin  aspart (novoLOG ) injection 0-15 Units  0-15 Units Subcutaneous TID WC Hobson, Fran E, NP   3 Units at 04/30/24 1705   insulin  aspart (novoLOG ) injection 0-5 Units  0-5 Units Subcutaneous QHS Hobson, Fran E, NP   2 Units at 04/30/24 05-Jun-2108   insulin  aspart (novoLOG ) injection 4 Units  4 Units Subcutaneous TID WC Hobson, Fran E, NP   4 Units at 05/01/24 0816   insulin  glargine (LANTUS )  injection 15 Units  15 Units Subcutaneous Daily Hobson, Fran E, NP   15 Units at 05/01/24 9050   magnesium  hydroxide (MILK OF MAGNESIA) suspension 30 mL  30 mL Oral Daily PRN Teresa Jes L, NP       QUEtiapine  (SEROQUEL ) tablet 150 mg  150 mg Oral QHS Hobson, Fran E, NP   150 mg at 04/30/24 2110   Current Outpatient Medications  Medication Sig Dispense Refill   albuterol  (VENTOLIN  HFA) 108 (90 Base) MCG/ACT inhaler Inhale 2 puffs into the lungs every 6 (six) hours as needed for wheezing or shortness of breath. 6.7 g 0   cetirizine  (ZYRTEC ) 10 MG tablet Take 1 tablet (10 mg total) by mouth daily. (Patient not taking: Reported on 04/27/2024) 30 tablet 0   fluticasone  (FLONASE ) 50 MCG/ACT nasal spray Place 2 sprays into both nostrils daily. (Patient not taking: Reported on 04/27/2024) 16 g 0   Insulin  Glargine (BASAGLAR  KWIKPEN) 100 UNIT/ML Inject 15 Units into the skin daily.     metFORMIN (GLUCOPHAGE) 500 MG tablet Take 1,000 mg by mouth 2 (two) times daily.  (Patient not taking: Reported on 04/27/2024)     QUEtiapine  (SEROQUEL ) 50 MG tablet Take 3 tablets (150 mg total) by mouth at bedtime. 90 tablet 1   Semaglutide,0.25 or 0.5MG /DOS, 2 MG/3ML SOPN Inject 0.5 mg into the skin once a week.     traZODone  (DESYREL ) 50 MG tablet Take 1 tablet (50 mg total) by mouth at bedtime as needed for sleep. (Patient not taking: Reported on 04/27/2024) 30 tablet 1    Labs  Lab Results:  Admission on 04/27/2024  Component Date Value Ref Range Status   Hgb A1c MFr Bld 04/28/2024 6.7 (H)  4.8 - 5.6 % Final   Comment: (NOTE) Diagnosis of Diabetes The following HbA1c ranges recommended by the American Diabetes Association (ADA) may be used as an aid in the diagnosis of diabetes mellitus.  Hemoglobin             Suggested A1C NGSP%              Diagnosis  <5.7                   Non Diabetic  5.7-6.4                Pre-Diabetic  >6.4                   Diabetic  <7.0                   Glycemic control for                       adults with diabetes.     Mean Plasma Glucose 04/28/2024 145.59  mg/dL Final   Performed at [facility], [address], [place], [place] [address]   Cholesterol 04/28/2024 90  0 - 200 mg/dL Final   Comment:        ATP III CLASSIFICATION:  <200     mg/dL   Desirable  799-760  mg/dL   Borderline High  >=759    mg/dL   High           Triglycerides 04/28/2024 95  <150 mg/dL Final   HDL 98/97/7973 30 (L)  >40 mg/dL Final   Total CHOL/HDL Ratio 04/28/2024 3.0  RATIO Final   VLDL 04/28/2024 19  0 - 40 mg/dL Final   LDL Cholesterol  04/28/2024 41  0 - 99 mg/dL Final   Comment:        Total Cholesterol/HDL:CHD Risk Coronary Heart Disease Risk Table                     Men   Women  1/2 Average Risk   3.4   3.3  Average Risk       5.0   4.4  2 X Average Risk   9.6   7.1  3 X Average Risk  23.4   11.0        Use the calculated Patient Ratio above and the CHD Risk Table to determine the patient's CHD Risk.        ATP III  CLASSIFICATION (LDL):  <100     mg/dL   Optimal  899-870  mg/dL   Near or Above                    Optimal  130-159  mg/dL   Borderline  839-810  mg/dL   High  >809     mg/dL   Very High Performed at [facility], [address], [place], [place] [address]    TSH 04/28/2024 2.600  0.350 - 4.500 uIU/mL Final   Performed at [facility], [address] [place], [place] [address]   Glucose-Capillary 04/28/2024 238 (H)  70 - 99 mg/dL Final   Glucose reference range applies only to samples taken after fasting for at least 8 hours.   Glucose-Capillary 04/28/2024 240 (H)  70 - 99 mg/dL Final   Glucose reference range applies only to samples taken after fasting for at least 8 hours.   Glucose-Capillary 04/28/2024 286 (H)  70 - 99 mg/dL Final   Glucose reference range applies only to samples taken after fasting for at least 8 hours.   Glucose-Capillary 04/29/2024 221 (H)  70 - 99 mg/dL Final   Glucose reference range applies only to samples taken after fasting for at least 8 hours.   Glucose-Capillary 04/29/2024 209 (H)  70 - 99 mg/dL Final   Glucose reference range applies only to samples taken after fasting for at least 8 hours.   Glucose-Capillary 04/29/2024 213 (H)  70 - 99 mg/dL Final   Glucose reference range applies only to samples taken after fasting for at least 8 hours.   Glucose-Capillary 04/30/2024 164 (H)  70 - 99 mg/dL Final   Glucose reference range applies only to samples taken after fasting for at least 8 hours.   Glucose-Capillary 04/30/2024 175 (H)  70 - 99 mg/dL Final   Glucose reference range applies only to samples taken after fasting for at least 8 hours.   Glucose-Capillary 04/30/2024 221 (H)  70 - 99 mg/dL Final   Glucose reference range applies only to samples taken after fasting for at least 8 hours.   Glucose-Capillary 05/01/2024 162 (H)  70 - 99 mg/dL Final   Glucose reference range applies only to samples taken after fasting for at least 8 hours.   Admission on 04/27/2024, Discharged on 04/27/2024  Component Date Value Ref Range Status   Sodium 04/27/2024 136  135 - 145 mmol/L Final   Electrolytes repeated to verify    Potassium 04/27/2024 3.8  3.5 - 5.1 mmol/L Final   Chloride 04/27/2024 94 (L)  98 - 111 mmol/L Final   CO2 04/27/2024 16 (L)  22 - 32 mmol/L Final   Glucose, Bld 04/27/2024 147 (H)  70 - 99 mg/dL Final   Glucose reference range applies only to samples taken after fasting for at least 8 hours.   BUN 04/27/2024 50 (H)  6 - 20 mg/dL Final   Creatinine, Ser 04/27/2024 1.69 (H)  0.61 - 1.24 mg/dL Final   Calcium 98/98/7973 10.0  8.9 - 10.3 mg/dL Final   Total Protein 98/98/7973 9.1 (H)  6.5 - 8.1 g/dL Final   Albumin 98/98/7973 4.9  3.5 - 5.0 g/dL Final   AST 98/98/7973 82 (H)  15 - 41 U/L Final   HEMOLYSIS AT THIS LEVEL MAY AFFECT RESULT   ALT 04/27/2024 38  0 - 44 U/L Final   Alkaline Phosphatase 04/27/2024 130 (H)  38 - 126 U/L Final   Total Bilirubin 04/27/2024 2.7 (H)  0.0 - 1.2 mg/dL Final   GFR, Estimated 04/27/2024 55 (L)  >60 mL/min Final   Comment: (NOTE) Calculated using the CKD-EPI Creatinine Equation (2021)    Anion gap 04/27/2024 25 (H)  5 - 15 Final   Performed at [facility], [address], [place], [place] [address]   Alcohol, Ethyl (B) 04/27/2024 <15  <15 mg/dL Final   Comment: (NOTE) For medical purposes only. Performed at [facility], [address], [place], [place] [address]    WBC 04/27/2024 18.2 (H)  4.0 - 10.5 K/uL Final   RBC 04/27/2024 5.75  4.22 - 5.81 MIL/uL Final   Hemoglobin 04/27/2024 16.1  13.0 - 17.0 g/dL Final   HCT 98/98/7973 48.5  39.0 - 52.0 % Final   MCV 04/27/2024 84.3  80.0 - 100.0 fL Final   MCH 04/27/2024 28.0  26.0 - 34.0 pg Final   MCHC 04/27/2024 33.2  30.0 - 36.0 g/dL Final   RDW 98/98/7973 12.8  11.5 - 15.5 % Final   Platelets 04/27/2024 446 (H)  150 - 400 K/uL Final   nRBC 04/27/2024 0.0  0.0 - 0.2 % Final   Performed  at [facility], [address] [place], [place] [address]   Opiates 04/27/2024 NEGATIVE  NEGATIVE Final   Cocaine 04/27/2024 NEGATIVE  NEGATIVE Final   Benzodiazepines 04/27/2024 NEGATIVE  NEGATIVE Final   Amphetamines 04/27/2024 POSITIVE (A)  NEGATIVE Final   Tetrahydrocannabinol 04/27/2024 NEGATIVE  NEGATIVE Final   Barbiturates 04/27/2024 NEGATIVE  NEGATIVE Final   Methadone Scn, Ur 04/27/2024 NEGATIVE  NEGATIVE Final   Fentanyl  04/27/2024 NEGATIVE  NEGATIVE Final   Comment: (NOTE) Drug screen is for Medical Purposes only. Positive results are preliminary only. If confirmation is needed, notify lab within 5 days.  Drug Class                 Cutoff (ng/mL) Amphetamine and metabolites 1000 Barbiturate and metabolites 200 Benzodiazepine              200 Opiates and metabolites     300 Cocaine and metabolites     300 THC                         50 Fentanyl                     5 Methadone                   300  Trazodone  is metabolized in vivo to several metabolites,  including pharmacologically active m-CPP, which is excreted in the  urine.  Immunoassay screens for amphetamines and MDMA have potential  cross-reactivity with these compounds and may provide false positive  result.  Performed at [facility], [address] [place], [place] [address]    Sodium 04/27/2024 137  135 - 145 mmol/L Final   Potassium 04/27/2024 3.8  3.5 - 5.1 mmol/L Final   Chloride 04/27/2024 98  98 - 111 mmol/L Final   CO2 04/27/2024 24  22 - 32 mmol/L Final   Glucose, Bld 04/27/2024 231 (H)  70 - 99 mg/dL Final   Glucose reference range applies only to samples taken after fasting for at least 8 hours.   BUN 04/27/2024 36 (H)  6 - 20 mg/dL Final   Creatinine, Ser 04/27/2024 1.17  0.61 - 1.24 mg/dL Final   Calcium 98/98/7973 9.3  8.9 - 10.3 mg/dL Final   GFR, Estimated 04/27/2024 >60  >60 mL/min Final   Comment: (NOTE) Calculated using the CKD-EPI Creatinine  Equation (2021)    Anion gap 04/27/2024 15  5 - 15 Final   Performed at [facility], [address] [place], [place] [address]   Specimen Source 04/27/2024 URINE, CLEAN CATCH   Final   Color, Urine 04/27/2024 YELLOW  YELLOW Final   APPearance 04/27/2024 HAZY (A)  CLEAR Final   Specific Gravity, Urine 04/27/2024 1.017  1.005 - 1.030 Final   pH 04/27/2024 5.0  5.0 - 8.0 Final   Glucose, UA 04/27/2024 NEGATIVE  NEGATIVE mg/dL Final   Hgb urine dipstick 04/27/2024 LARGE (A)  NEGATIVE Final   Bilirubin Urine 04/27/2024 NEGATIVE  NEGATIVE Final   Ketones, ur 04/27/2024 5 (A)  NEGATIVE mg/dL Final   Protein, ur 98/98/7973 100 (A)  NEGATIVE mg/dL Final   Nitrite 98/98/7973 NEGATIVE  NEGATIVE Final   Leukocytes,Ua 04/27/2024 NEGATIVE  NEGATIVE Final   RBC / HPF 04/27/2024 11-20  0 - 5 RBC/hpf Final   WBC, UA 04/27/2024 0-5  0 - 5 WBC/hpf Final   Comment:        Reflex urine culture not performed if WBC <=10, OR if Squamous epithelial cells >5. If Squamous epithelial cells >5 suggest recollection.    Bacteria, UA 04/27/2024 RARE (A)  NONE SEEN Final   Squamous Epithelial / HPF 04/27/2024 0-5  0 - 5 /HPF Final   Mucus 04/27/2024 PRESENT   Final   Hyaline Casts, UA 04/27/2024 PRESENT   Final   Performed at [facility], [address] [place], [place] [address]   Salicylate Lvl 04/27/2024 <7.0 (L)  7.0 - 30.0 mg/dL Final   Performed at [facility], [address] [place], [place] [address]   Acetaminophen  (Tylenol ), Serum 04/27/2024 <10 (L)  10 - 30 ug/mL Final   Comment: (NOTE) Toxic concentrations can be more effectively related to post dose interval; >200, >100, and >50 ug/mL serum concentrations correspond to toxic concentrations at 4, 8, and 12 hours post dose, respectively.  Performed at [facility], [address], [place], [place] [address]   Admission on 03/22/2024, Discharged on 03/22/2024   Component Date Value Ref Range Status   Influenza A Antigen, POC 03/22/2024 Negative  Negative Final   Influenza B Antigen, POC 03/22/2024 Negative  Negative Final   Covid Antigen, POC 03/22/2024 Negative  Negative Final    Blood Alcohol level:  Lab Results  Component Value Date   [facility] <15 04/27/2024   ETH <10 01/17/2023    Metabolic Disorder Labs: Lab Results  Component Value Date   HGBA1C 6.7 (H) 04/28/2024  MPG 145.59 04/28/2024   MPG 159.94 01/17/2023   Lab Results  Component Value Date   PROLACTIN 10.1 01/17/2023   Lab Results  Component Value Date   CHOL 90 04/28/2024   TRIG 95 04/28/2024   HDL 30 (L) 04/28/2024   CHOLHDL 3.0 04/28/2024   VLDL 19 04/28/2024   LDLCALC 41 04/28/2024   LDLCALC 79 01/17/2023    Therapeutic Lab Levels: No results found for: LITHIUM Lab Results  Component Value Date   VALPROATE 62 10/16/2019   No results found for: CBMZ  Physical Findings   AIMS    Flowsheet Row Admission (Discharged) from 12/30/2021 in [facility] Admission (Discharged) from 01/16/2021 in [facility]PAT[facility]AL MEDICINE [facility]) [facility] ([facility]) [facility]  AIMS Total Score 0 0 0 0   AUDIT    Flowsheet Row Admission (Discharged) from 01/18/2023 in [facility] INPATIENT BEHAVIORAL MEDICINE Admission (Discharged) from 12/30/2021 in [facility] INPATIENT BEHAVIORAL MEDICINE Admission (Discharged) from 12/04/2021 in [facility] INPATIENT BEHAVIORAL MEDICINE Admission (Discharged) from 01/16/2021 in [facility] INPATIENT BE[facility]AVIORAL MEDICINE Admission (Discharged) [facility]  Alcohol Use Disorder Identification Test Final Score (AUDIT) 1 2 2 1  34   PHQ2-9    Flowsheet Row ED from 04/27/2024 in [facility] Most recent reading at 04/30/2024  9:40 AM ED from  04/27/2024 in [facility] at [facility] Most recent reading at 04/27/2024  7:02 AM ED from 04/14/2021 in [facility] Emergency Department at [facility] Most recent reading at 04/15/2021  2:16 AM ED from 02/05/2021 in [facility] Most recent reading at 02/05/2021  4:55 AM ED from 01/14/2021 in [facility] Emergency Department at [facility] Most recent reading at 01/15/2021  8:04 AM  PHQ-2 Total Score 4 3 2 4 2   PHQ-9 Total Score 23 11 16 9 6    Flowsheet Row ED from 04/27/2024 in [facility] Most recent reading at 04/29/2024  8:42 AM ED from 04/27/2024 in [facility] Emergency Department at [facility] Most recent reading at 04/27/2024  5:26 AM UC from 03/22/2024 in [facility] [place] Most recent reading at 03/22/2024 11:58 AM  C-SSRS RISK CATEGORY High Risk No Risk No Risk     Musculoskeletal  Strength & Muscle Tone: within normal limits Gait & Station: normal Patient leans: N/A  Psychiatric Specialty Exam  Presentation  General Appearance:  Casual  Eye Contact: Fair  Speech: Clear and Coherent  Speech Volume: Normal  Handedness: Right   Mood and Affect  Mood: Depressed; Anxious  Affect: Congruent   Thought Process  Thought Processes: Coherent  Descriptions of Associations:Intact  Orientation:Full (Time, Place and Person)  Thought Content:Logical  Diagnosis of Schizophrenia or Schizoaffective disorder in past: No    Hallucinations:Hallucinations: None  Ideas of Reference:None  Suicidal Thoughts:Suicidal Thoughts: Yes, Passive SI Passive Intent and/or Plan: Without Intent; Without Plan  Homicidal Thoughts:Homicidal Thoughts: No   Sensorium  Memory: Immediate Fair  Judgment: Fair  Insight: Fair   Chartered Certified Accountant: Fair  Attention Span: Fair  Recall: [employer] of  Knowledge: Fair  Language: Fair   Psychomotor Activity  Psychomotor Activity:Psychomotor Activity: Normal   Assets  Assets: Resilience   Sleep  Sleep:Sleep: Fair  Estimated Sleeping Duration (Last 24 Hours): 11.75-13.00 hours  Nutritional Assessment (For  OBS and FBC admissions only) Has the patient had a weight loss or gain of 10 pounds or more in the last 3 months?: No Has the patient had a decrease in food intake/or appetite?: No Does the patient have dental problems?: No Does the patient have eating habits or behaviors that may be indicators of an eating disorder including binging or inducing vomiting?: No Has the patient recently lost weight without trying?: 0 Has the patient been eating poorly because of a decreased appetite?: 0 Malnutrition Screening Tool Score: 0    Physical Exam  Physical Exam Vitals and nursing note reviewed.  HENT:     Head: Normocephalic.     Mouth/Throat:     Mouth: Mucous membranes are moist.  Cardiovascular:     Rate and Rhythm: Normal rate.  Pulmonary:     Effort: Pulmonary effort is normal.  Musculoskeletal:        General: Normal range of motion.     Cervical back: Normal range of motion.  Skin:    General: Skin is warm and dry.  Neurological:     Mental Status: He is alert and oriented to person, place, and time.  Psychiatric:     Comments: See HPI    Review of Systems  Constitutional:  Negative for chills and fever.  HENT:  Negative for sinus pain and sore throat.   Respiratory:  Negative for cough and shortness of breath.   Gastrointestinal:  Negative for diarrhea, nausea and vomiting.  Psychiatric/Behavioral:  Positive for depression, substance abuse and suicidal ideas (passive SI, no plan/intent). Negative for hallucinations and memory loss. The patient is not nervous/anxious and does not have insomnia.    Blood pressure 101/68, pulse 81, temperature 97.7 F (36.5 C), temperature source Oral, resp. rate 18, SpO2  97%. There is no height or weight on file to calculate BMI.  Treatment Plan Summary: Daily contact with patient to assess and evaluate symptoms and progress in treatment   Medication Management Continue CBG monitoring QID before meals and at bedtime Continue agitation protocol Continue Lantus  15 units SQ daily Continue Seroquel  150 mg PO at bedtime Continue Glycemic control with sliding scales for day & nighttime coverages as per the MAR-Please see MAR for details Continue Wellbutrin  XL 150 mg PO every day for depression    PRNS -Continue hydroxyzine  25 mg PO TID prn anxiety -Continue Tylenol  650 mg every 6 hours PRN for mild pain -Continue Maalox 30 mg every 4 hrs PRN for indigestion -Continue Milk of Magnesia as needed every 6 hrs for constipation  Safety and Monitoring: Voluntary admission to inpatient psychiatric unit for safety, stabilization and treatment Daily contact with patient to assess and evaluate symptoms and progress in treatment Patient's case to be discussed in multi-disciplinary team meeting Observation Level : q15 minute checks Vital signs: q12 hours Precautions: Safety  Long Term Goal(s): Improvement in symptoms so as ready for discharge  Short Term Goals: Ability to identify changes in lifestyle to reduce recurrence of condition will improve, Ability to verbalize feelings will improve, Ability to disclose and discuss suicidal ideas, Ability to demonstrate self-control will improve, Ability to identify and develop effective coping behaviors will improve, Ability to maintain clinical measurements within normal limits will improve, Compliance with prescribed medications will improve, and Ability to identify triggers associated with substance abuse/mental health issues will improve  Discharge Planning: Social work and case management to assist with discharge planning and identification of hospital follow-up needs prior to discharge Estimated LOS: 5-7 days  Discharge  Concerns: Need to establish a safety plan; Medication compliance and effectiveness Discharge Goals: Return home with outpatient referrals for mental health follow-up including medication management/psychotherapy  I certify that inpatient services furnished can reasonably be expected to improve the patient's condition.    Total Time Spent in Direct Patient Care:  I personally spent 50 minutes on the unit in direct patient care. The direct patient care time included face-to-face time with the patient, reviewing the patient's chart, communicating with other professionals, and coordinating care. Greater than 50% of this time was spent in counseling or coordinating care with the patient regarding goals of hospitalization, psycho-education, and discharge planning needs.   Donia Snell, NP 1/5/202610:59 AM

## 2024-05-01 NOTE — ED Notes (Addendum)
 Pt awakened for AM VS and FSBS.  Pt. Was able to eat all of his breakfast.  Denies SI/HI/AVH.  Covered with SSI 3 unit + 4 Units novolog  with each meal for total 7 units short acting insulin .  Pt gives it to himself in the abdomen at his request.  Reports feeling depressed at 7/10.  He is anxious and concerned about getting his belongings from facility in [place] prior to d/c.   He states that he spoke with them there and they have given him a time frame to get his stuff before they throw it out.  Pt provided with new phone number for his wife and he is going to speak with her about getting his belongings for him.

## 2024-05-01 NOTE — ED Notes (Signed)
 Pt provided with lunch. FSBS 105.  No SSI coverage needed.  Pt is feeling anxious about getting his belongings in [place].  States that his wife can't get them because her mother will get upset and probably throw them out anyway.   Reassured him that either staff here would look for some clothes for him here or the facility that he goes  to would most likely have clothing to donate.  Pt encouraged to eat lunch as he didn't eat much for breakfast.

## 2024-05-01 NOTE — ED Notes (Signed)
 Resulted labs noted.  Vit D low.  B12, CBC WNL  Anticipate d/c tomorrow AM by 1100.  Pt is quietly resting/sleeping in his room.

## 2024-05-01 NOTE — ED Notes (Signed)
 Pt sleeping in bed, no acute distress noted. Respirations even and unlabored. Continue to monitor for safety.

## 2024-05-01 NOTE — Care Management (Addendum)
 [facility] Care Management...  Addendum 3:13 pm  Per Levorn at [facility] patient has been accepted  Patient will discharge Tuesday 05/02/2024 by 11 am  [employer] is a registered [employer] [facility]  [address] [place], [place] [address] Office phone [phone]   RN to arrange transportation Acupuncturist)  7 day samples 30 day scripts  Addendum 12:00 pm  Writer met with patient to discuss discharge planning  Patient reported that he is concerned about his clothes that are at [facility] in [place].  Writer informed patient that [employer] can provide voucher to buy clothing, they also have clothing bank  Patient inquired about getting into [employer], he is currently under review   Addendum 11:15 am  Clinical Research Associate spoke with Kye at [facility],  Per Kye, patient has approximately 8 garbage bags of clothing/shoes and a couple bags of medications. They do not have the staff to transport patients belongings to [facility].  Kye reported that patient would not be able to return, due to this being the second time that patient has left during the night and not returned.       Writer spoke with Marval at [facility]..  Patient needs to call to complete phone assessment  Writer will reach out to Keri at [facility] for bed availability

## 2024-05-01 NOTE — ED Notes (Signed)
 Pt A&O x 4, no distress noted. Calm & cooperative, pt in group meeting at present.  Monitoring for safety.

## 2024-05-01 NOTE — Care Plan (Signed)
 Interdisciplinary Treatment and Diagnostic Plan Update  05/01/2024 Time of Session: 2pm Nathan Koch MRN: [id number]  Diagnosis:  Final diagnoses:  Methamphetamine abuse ([name])  Type 2 diabetes mellitus with hyperglycemia, with long-term current use of insulin  ([name])  Methamphetamine-induced mood disorder ([name])     Current Medications:  Current Facility-Administered Medications  Medication Dose Route Frequency Provider Last Rate Last Admin   acetaminophen  (TYLENOL ) tablet 650 mg  650 mg Oral Q6H PRN White, Patrice L, NP       alum & mag hydroxide-simeth (MAALOX/MYLANTA) 200-200-20 MG/5ML suspension 30 mL  30 mL Oral Q4H PRN White, Patrice L, NP       buPROPion  (WELLBUTRIN  XL) 24 hr tablet 150 mg  150 mg Oral Daily Hobson, Fran E, NP   150 mg at 05/01/24 9051   haloperidol  (HALDOL ) tablet 5 mg  5 mg Oral TID PRN White, Patrice L, NP       And   diphenhydrAMINE  (BENADRYL ) capsule 50 mg  50 mg Oral TID PRN White, Patrice L, NP       haloperidol  lactate (HALDOL ) injection 5 mg  5 mg Intramuscular TID PRN White, Patrice L, NP       And   diphenhydrAMINE  (BENADRYL ) injection 50 mg  50 mg Intramuscular TID PRN White, Patrice L, NP       And   LORazepam  (ATIVAN ) injection 2 mg  2 mg Intramuscular TID PRN White, Patrice L, NP       haloperidol  lactate (HALDOL ) injection 10 mg  10 mg Intramuscular TID PRN White, Patrice L, NP       And   diphenhydrAMINE  (BENADRYL ) injection 50 mg  50 mg Intramuscular TID PRN White, Patrice L, NP       And   LORazepam  (ATIVAN ) injection 2 mg  2 mg Intramuscular TID PRN White, Patrice L, NP       hydrOXYzine  (ATARAX ) tablet 25 mg  25 mg Oral TID PRN White, Patrice L, NP       insulin  aspart (novoLOG ) injection 0-15 Units  0-15 Units Subcutaneous TID WC Hobson, Fran E, NP   3 Units at 05/01/24 0817   insulin  aspart (novoLOG ) injection 0-15 Units  0-15 Units Subcutaneous TID WC Hobson, Fran E, NP   3 Units at 04/30/24 1705   insulin  aspart (novoLOG ) injection  0-5 Units  0-5 Units Subcutaneous QHS Hobson, Fran E, NP   2 Units at 04/30/24 2110   insulin  aspart (novoLOG ) injection 4 Units  4 Units Subcutaneous TID WC Hobson, Fran E, NP   4 Units at 05/01/24 1239   insulin  glargine (LANTUS ) injection 15 Units  15 Units Subcutaneous Daily Hobson, Fran E, NP   15 Units at 05/01/24 9050   magnesium  hydroxide (MILK OF MAGNESIA) suspension 30 mL  30 mL Oral Daily PRN White, Wyline L, NP       QUEtiapine  (SEROQUEL ) tablet 150 mg  150 mg Oral QHS Hobson, Fran E, NP   150 mg at 04/30/24 2110   [START ON 05/02/2024] QUEtiapine  (SEROQUEL ) tablet 150 mg  150 mg Oral QHS Landy Veva LITTIE, RPH       Current Outpatient Medications  Medication Sig Dispense Refill   albuterol  (VENTOLIN  HFA) 108 (90 Base) MCG/ACT inhaler Inhale 2 puffs into the lungs every 6 (six) hours as needed for wheezing or shortness of breath. 6.7 g 0   cetirizine  (ZYRTEC ) 10 MG tablet Take 1 tablet (10 mg total) by mouth daily. (Patient not taking: Reported on  04/27/2024) 30 tablet 0   fluticasone  (FLONASE ) 50 MCG/ACT nasal spray Place 2 sprays into both nostrils daily. (Patient not taking: Reported on 04/27/2024) 16 g 0   Insulin  Glargine (BASAGLAR  KWIKPEN) 100 UNIT/ML Inject 15 Units into the skin daily.     metFORMIN (GLUCOPHAGE) 500 MG tablet Take 1,000 mg by mouth 2 (two) times daily. (Patient not taking: Reported on 04/27/2024)     QUEtiapine  (SEROQUEL ) 50 MG tablet Take 3 tablets (150 mg total) by mouth at bedtime. 90 tablet 1   Semaglutide,0.25 or 0.5MG /DOS, 2 MG/3ML SOPN Inject 0.5 mg into the skin once a week.     traZODone  (DESYREL ) 50 MG tablet Take 1 tablet (50 mg total) by mouth at bedtime as needed for sleep. (Patient not taking: Reported on 04/27/2024) 30 tablet 1   PTA Medications: Prior to Admission medications  Medication Sig Start Date End Date Taking? Authorizing Provider  albuterol  (VENTOLIN  HFA) 108 (90 Base) MCG/ACT inhaler Inhale 2 puffs into the lungs every 6 (six) hours as needed  for wheezing or shortness of breath. 02/02/23   Lee, Jacqueline Eun, NP  cetirizine  (ZYRTEC ) 10 MG tablet Take 1 tablet (10 mg total) by mouth daily. Patient not taking: Reported on 04/27/2024 03/22/24   Leath-Warren, Etta PARAS, NP  fluticasone  (FLONASE ) 50 MCG/ACT nasal spray Place 2 sprays into both nostrils daily. Patient not taking: Reported on 04/27/2024 03/22/24   Leath-Warren, Etta PARAS, NP  Insulin  Glargine (BASAGLAR  KWIKPEN) 100 UNIT/ML Inject 15 Units into the skin daily. 03/01/24   [provider]  metFORMIN (GLUCOPHAGE) 500 MG tablet Take 1,000 mg by mouth 2 (two) times daily. Patient not taking: Reported on 04/27/2024 03/17/24   [provider]  QUEtiapine  (SEROQUEL ) 50 MG tablet Take 3 tablets (150 mg total) by mouth at bedtime. 02/02/23 04/27/24  Lee, Jacqueline Eun, NP  Semaglutide,0.25 or 0.5MG /DOS, 2 MG/3ML SOPN Inject 0.5 mg into the skin once a week. 02/25/24   [provider]  traZODone  (DESYREL ) 50 MG tablet Take 1 tablet (50 mg total) by mouth at bedtime as needed for sleep. Patient not taking: Reported on 04/27/2024 02/02/23 03/04/23  Jama Arlyne Pollack, NP    Patient Stressors: Financial difficulties   Marital or family conflict   Substance abuse    Patient Strengths: Ability for insight  Motivation for treatment/growth  Supportive family/friends   Treatment Modalities: Medication Management, Group therapy, Case management,  1 to 1 session with clinician, Psychoeducation, Recreational therapy.   Physician Treatment Plan for Primary and Secondary Diagnosis:  Final diagnoses:  Methamphetamine abuse ([name])  Type 2 diabetes mellitus with hyperglycemia, with long-term current use of insulin  ([name])  Methamphetamine-induced mood disorder ([name])   Long Term Goal(s): Improvement in symptoms so as ready for discharge  Short Term Goals: Ability to identify changes in lifestyle to reduce recurrence of condition will improve Ability to verbalize feelings  will improve Ability to disclose and discuss suicidal ideas Ability to demonstrate self-control will improve Ability to identify and develop effective coping behaviors will improve Ability to maintain clinical measurements within normal limits will improve Compliance with prescribed medications will improve Ability to identify triggers associated with substance abuse/mental health issues will improve  Medication Management: Evaluate patient's response, side effects, and tolerance of medication regimen.  Therapeutic Interventions: 1 to 1 sessions, Unit Group sessions and Medication administration.  Evaluation of Outcomes: Progressing  LCSW Treatment Plan for Primary Diagnosis:  Final diagnoses:  Methamphetamine abuse ([name])  Type 2 diabetes mellitus with hyperglycemia, with  long-term current use of insulin  ([name])  Methamphetamine-induced mood disorder ([name])    Long Term Goal(s): Safe transition to appropriate next level of care at discharge.  Short Term Goals: Facilitate acceptance of mental health diagnosis and concerns through verbal commitment to aftercare plan and appointments at discharge.  Therapeutic Interventions: Assess for all discharge needs, 1 to 1 time with Child psychotherapist, Explore available resources and support systems, Assess for adequacy in community support network, Educate family and significant other(s) on suicide prevention, Complete Psychosocial Assessment, Interpersonal group therapy.  Evaluation of Outcomes: Progressing   Progress in Treatment: Attending groups: Yes. Participating in groups: Yes. Taking medication as prescribed: Yes. Toleration medication: No. Family/Significant other contact made: Yes, individual(s) contacted:  Wife Patient understands diagnosis: Yes. Discussing patient identified problems/goals with staff: Yes. Medical problems stabilized or resolved: Yes. Denies suicidal/homicidal ideation: Yes. Issues/concerns per patient self-inventory:  No. Other: Client reports no SI/HI and or plans.  Client report no AVH.  Client has been accepted into [facility] .  Client will make a decision if he wants to leave by tomorrow.  Client reports debating about going to [facility] or back to his last inpatient residence to get his belongings.  Client reports he will go to [facility] and worry about his belongings later.    New problem(s) identified: No, Describe:  NA  New Short Term/Long Term Goal(s): Client will complete his detox tomorrow and will arrive at [facility] via [employer].    Patient Goals:  to become sober for more than 30 days and get a place eventually with his family.   Discharge Plan or Barriers: Client reports no issues outside of getting his belongings.  Client reports he will continue to contact his family once he gets to [facility].   Reason for Continuation of Hospitalization: Anxiety Depression Other; describe Client will complete door to door transportation tomorrow to [facility].  Staff will continue to monitor for any concerns.   Estimated Length of Stay:04/28/23 -05/03/23  Last 3 [place] Suicide Severity Risk Score: Flowsheet Row ED from 04/27/2024 in [facility] Most recent reading at 04/29/2024  8:42 AM ED from 04/27/2024 in [facility] Emergency Department at [facility] Most recent reading at 04/27/2024  5:26 AM UC from 03/22/2024 in [facility] Most recent reading at 03/22/2024 11:58 AM  C-SSRS RISK CATEGORY High Risk No Risk No Risk    Last PHQ 2/9 Scores:    04/30/2024    9:40 AM 04/28/2024    9:27 AM 04/27/2024    7:02 AM  Depression screen PHQ 2/9  Decreased Interest 1 1 1   Down, Depressed, Hopeless 3 2 2   PHQ - 2 Score 4 3 3   Altered sleeping 3 3 2   Tired, decreased energy 3 2 2   Change in appetite 1 2 0  Feeling bad or failure about yourself  3 2 2   Trouble concentrating 3 2 0  Moving slowly or fidgety/restless 3 2 0  Suicidal  thoughts 3 2 2   PHQ-9 Score 23 18 11   Difficult doing work/chores   Very difficult    Scribe for Treatment Team: Der Gagliano, LCSW 05/01/2024 4:13 PM

## 2024-05-02 DIAGNOSIS — F1594 Other stimulant use, unspecified with stimulant-induced mood disorder: Secondary | ICD-10-CM | POA: Diagnosis not present

## 2024-05-02 LAB — GLUCOSE, CAPILLARY: Glucose-Capillary: 168 mg/dL — ABNORMAL HIGH (ref 70–99)

## 2024-05-02 NOTE — ED Provider Notes (Signed)
 FBC/OBS ASAP Discharge Summary  Date and Time: 05/02/2024 11:00 AM  Name: ASAHD CAN  MRN:  [id number]   Discharge Diagnoses:  Final diagnoses:  Methamphetamine abuse ([name])  Type 2 diabetes mellitus with hyperglycemia, with long-term current use of insulin  ([name])  Methamphetamine-induced mood disorder ([name])   HPI: Nathan Koch is a 34 y.o. male with a mental health history of Bipolar disorder, MDD & PTSD who initially presented to the [facility] with complaints of suicidal ideations with plans to either cut himself or jump in front of traffic in the context of recently relapsing on Methamphetamines. He reported risk factors including 2-3 prior suicide attempts, homelessness, & family conflict.  As per ER documentation: Initial labs showed AKI with a creatinine of 1.7 from baseline normal as well as an anion gap of 25. The patient had urine, Tylenol  and salicylates added on and will be started on IV fluids. He is currently asymptomatic at this time.SABRASABRASABRARepeat labs normalized. Patient is medically cleared. Accepted to inpatient psych by Dr. Cole at facility based crisis center. Arlette, [place], DO, 04/27/24 1337).   [facility] on the same day (04/27/2024) for treatment & stabilization of his mental status, & assistance regaining & maintaining his sobriety.  Stay Summary: Patient was [facility] to the [facility] with a goal of treatment and stabilization, prior to referrals to an inpatient rehabilitation facility. During his stay at the [facility], the patient worked with CSW to coordinate getting into rehabilitation facilities, and was accepted into [facility] in  [address] [place], [place] [address] Office phone [phone].  Goal was to facilitate a door-to-door transfer into the facility in order to mitigate the risk  of pt relapsing. Pt was transferred to the above facility on 05/03/2023. Prior to transfer, pt was assessed by clinical research associate, and presented: With a euthymic mood, attention to personal hygiene and grooming is fair, eye contact is good, speech is clear & coherent. Thought contents are organized and logical, and pt currently denies SI/HI/AVH or paranoia. There is no evidence of delusional thoughts. No overt signs of psychosis noted. Pt denied first rank symptoms, and left with a 30 day script of medications as well as a 7 day sample of medications provided by the [facility] pharmacy. He denied concerns or distress at discharge, and medications prescribed at discharge consisted of the following: -Wellbutrin  150 mg daily for MDD -Seroquel  150 mg nightly for mood stabilization/sleep -Insuliun Lantus  15 units nightly as well as 4 units TID for Diabetes -Albuterol  inhaler for wheezing/shortness of breath as needed due to a history of asthma as per his reports.  Education provided on all medication benefits, rationales and possible side effects. Pt denied side effects of all medications prior to discharge. Labs were reviewed with the patient, and abnormal results were discussed with the patient.  The patient is able to verbalize their individual safety plan to this provider as follows prior to discharge:.  # It is recommended to the patient to continue psychiatric medications as prescribed, after discharge from the hospital.    # It is recommended to the patient to follow up with your outpatient psychiatric provider and PCP.  # It was discussed with the patient, the impact of alcohol, drugs, tobacco have been there overall psychiatric and medical wellbeing, and total abstinence from substance use was recommended the patient.  # Prescriptions  provided or sent directly to preferred pharmacy at discharge. Patient agreeable to plan. Given opportunity to ask questions. Appears to feel comfortable with discharge.    # In  the event of worsening symptoms, the patient is instructed to call the crisis hotline, 911 and or go to the nearest ED for appropriate evaluation and treatment of symptoms. To follow-up with primary care provider for other medical issues, concerns and or health care needs.  # Patient was discharged with a plan to follow up as noted below:   Discharge Instructions      [facility] Care Management...  Patient requested inpatient treatment  Per [facility] at [facility] patient has been accepted  Patient agreed to this inpatient treatment placement  Patient will discharge Tuesday 05/02/2024 by 11 am  [employer] is a registered [employer] [address] [employer] [facility]  [address] [place], [place] [address] Office phone [phone]   RN to arrange transportation Acupuncturist)  7 day samples 30 day scripts    .   Total Time spent with patient: 1 hour  Past Psychiatric History: polysubstance abuse, MDD Past Medical History:  Past Medical History:  Diagnosis Date   Anxiety    Anxiety disorder    Asthma    Bipolar 1 disorder ([name])    Depression    Methamphetamine abuse ([name])    PTSD (post-traumatic stress disorder)     Family History:  Family History  Problem Relation Age of Onset   Asthma Mother    Cancer Mother        breast cancer   Ulcers Mother    Cancer Father        esophogeal cancer   Hypertension Father    Asthma Brother     Family Psychiatric History: not provided  Social History:  Social History   Socioeconomic History   Marital status: Single    Spouse name: Not on file   Number of children: 1   Years of education: 10   Highest education level: 10th grade  Occupational History   Not on file  Tobacco Use   Smoking status: Every Day    Current packs/day: 0.50    Average packs/day: 0.5 packs/day for 8.0 years (4.0 ttl pk-yrs)    Types: Cigarettes   Smokeless tobacco: Never  Vaping Use   Vaping status: Every Day   Substances:  Nicotine , Flavoring  Substance and Sexual Activity   Alcohol use: Not Currently    Alcohol/week: 13.0 standard drinks of alcohol    Types: 6 Cans of beer, 7 Standard drinks or equivalent per week    Comment: previously heavy drinker 2016. most recent 4 beer/ day drinker and none since 01-11-2020   Drug use: Yes    Types: Methamphetamines    Comment: states he uses 1 g every other day   Sexual activity: Yes  Other Topics Concern   Not on file  Social History Narrative   Not on file   Social Drivers of Health   Tobacco Use: High Risk (04/27/2024)   Patient History    Smoking Tobacco Use: Every Day    Smokeless Tobacco Use: Never    Passive Exposure: Not on file  Financial Resource Strain: High Risk (05/04/2023)   Received from [facility]   Overall Financial Resource Strain (CARDIA)    Difficulty of Paying Living Expenses: Very hard  Food Insecurity: No Food Insecurity (04/27/2024)   Epic    Worried About Radiation Protection Practitioner of Food in the Last Year:  Never true    Ran Out of Food in the Last Year: Never true  Transportation Needs: No Transportation Needs (12/04/2023)   Received from [facility]    In the past 12 months, has lack of transportation kept you from medical appointments or from getting medications?: No    In the past 12 months, has lack of transportation kept you from meetings, work, or from getting things needed for daily living?: No  Physical Activity: Insufficiently Active (05/04/2023)   Received from [facility]   Exercise Vital Sign    On average, how many days per week do you engage in moderate to strenuous exercise (like a brisk walk)?: 2 days    On average, how many minutes do you engage in exercise at this level?: 20 min  Stress: Stress Concern Present (12/04/2023)   Received from [facility] - Occupational Stress Questionnaire    Do you feel stress - tense, restless, nervous, or anxious, or unable to sleep at night  because your mind is troubled all the time - these days?: Very much  Social Connections: Socially Integrated (05/04/2023)   Received from [facility]   Social Network    How would you rate your social network (family, work, friends)?: Good participation with social networks  Intimate Partner Violence: Not At Risk (12/03/2023)   Received from [facility]   HITS    Over the last 12 months how often did your partner physically hurt you?: Never    Over the last 12 months how often did your partner insult you or talk down to you?: Never    Over the last 12 months how often did your partner threaten you with physical harm?: Never    Over the last 12 months how often did your partner scream or curse at you?: Never  Depression (PHQ2-9): High Risk (04/30/2024)   Depression (PHQ2-9)    PHQ-2 Score: 23  Alcohol Screen: Low Risk (01/18/2023)   Alcohol Screen    Last Alcohol Screening Score (AUDIT): 1  Housing: High Risk (12/04/2023)   Received from [facility]    In the last 12 months, was there a time when you were not able to pay the mortgage or rent on time?: Yes    In the past 12 months, how many times have you moved where you were living?: 1    At any time in the past 12 months, were you homeless or living in a shelter (including now)?: No  Utilities: Not At Risk (12/04/2023)   Received from [facility]    In the past 12 months has the electric, gas, oil, or water company threatened to shut off services in your home?: No  Health Literacy: Not on file    Tobacco Cessation:  N/A, patient does not currently use tobacco products  Current Medications:  Current Facility-Administered Medications  Medication Dose Route Frequency Provider Last Rate Last Admin   acetaminophen  (TYLENOL ) tablet 650 mg  650 mg Oral Q6H PRN White, Patrice L, NP       alum & mag hydroxide-simeth (MAALOX/MYLANTA) 200-200-20 MG/5ML suspension 30 mL  30 mL Oral Q4H PRN White, Patrice L, NP       buPROPion   (WELLBUTRIN  XL) 24 hr tablet 150 mg  150 mg Oral Daily Hobson, Fran E, NP   150 mg at 05/02/24 0924   haloperidol  (HALDOL ) tablet 5 mg  5 mg Oral TID  PRN Teresa Wyline CROME, NP       And   diphenhydrAMINE  (BENADRYL ) capsule 50 mg  50 mg Oral TID PRN White, Patrice L, NP       haloperidol  lactate (HALDOL ) injection 5 mg  5 mg Intramuscular TID PRN White, Patrice L, NP       And   diphenhydrAMINE  (BENADRYL ) injection 50 mg  50 mg Intramuscular TID PRN White, Patrice L, NP       And   LORazepam  (ATIVAN ) injection 2 mg  2 mg Intramuscular TID PRN White, Patrice L, NP       haloperidol  lactate (HALDOL ) injection 10 mg  10 mg Intramuscular TID PRN White, Patrice L, NP       And   diphenhydrAMINE  (BENADRYL ) injection 50 mg  50 mg Intramuscular TID PRN White, Patrice L, NP       And   LORazepam  (ATIVAN ) injection 2 mg  2 mg Intramuscular TID PRN White, Patrice L, NP       hydrOXYzine  (ATARAX ) tablet 25 mg  25 mg Oral TID PRN White, Patrice L, NP       insulin  aspart (novoLOG ) injection 0-15 Units  0-15 Units Subcutaneous TID WC Hobson, Fran E, NP   3 Units at 05/01/24 0817   insulin  aspart (novoLOG ) injection 0-15 Units  0-15 Units Subcutaneous TID WC Hobson, Fran E, NP   3 Units at 05/02/24 0827   insulin  aspart (novoLOG ) injection 0-5 Units  0-5 Units Subcutaneous QHS Hobson, Fran E, NP   0 Units at 05/01/24 2109   insulin  aspart (novoLOG ) injection 4 Units  4 Units Subcutaneous TID WC Hobson, Fran E, NP   4 Units at 05/02/24 0827   insulin  glargine (LANTUS ) injection 15 Units  15 Units Subcutaneous Daily Hobson, Fran E, NP   15 Units at 05/02/24 9074   magnesium  hydroxide (MILK OF MAGNESIA) suspension 30 mL  30 mL Oral Daily PRN Teresa Wyline L, NP       QUEtiapine  (SEROQUEL ) tablet 150 mg  150 mg Oral QHS Hobson, Fran E, NP   150 mg at 05/01/24 2108   QUEtiapine  (SEROQUEL ) tablet 150 mg  150 mg Oral QHS Landy Veva CROME, [facility]       Current Outpatient Medications  Medication Sig Dispense Refill    albuterol  (VENTOLIN  HFA) 108 (90 Base) MCG/ACT inhaler Inhale 2 puffs into the lungs every 6 (six) hours as needed for wheezing or shortness of breath. 6.7 g 0   buPROPion  (WELLBUTRIN  XL) 150 MG 24 hr tablet Take 1 tablet (150 mg total) by mouth daily. 30 tablet 0   insulin  aspart (NOVOLOG ) 100 UNIT/ML injection Inject 4 Units into the skin 3 (three) times daily with meals.     insulin  glargine (LANTUS ) 100 UNIT/ML injection Inject 0.15 mLs (15 Units total) into the skin daily. 10 mL 0   QUEtiapine  Fumarate 150 MG TABS Take 150 mg by mouth at bedtime. 30 tablet 0    PTA Medications:  Facility Ordered Medications  Medication   [COMPLETED] sodium chloride  0.9 % bolus 2,000 mL   acetaminophen  (TYLENOL ) tablet 650 mg   alum & mag hydroxide-simeth (MAALOX/MYLANTA) 200-200-20 MG/5ML suspension 30 mL   magnesium  hydroxide (MILK OF MAGNESIA) suspension 30 mL   haloperidol  (HALDOL ) tablet 5 mg   And   diphenhydrAMINE  (BENADRYL ) capsule 50 mg   haloperidol  lactate (HALDOL ) injection 5 mg   And   diphenhydrAMINE  (BENADRYL ) injection 50 mg   And   LORazepam  (ATIVAN )  injection 2 mg   haloperidol  lactate (HALDOL ) injection 10 mg   And   diphenhydrAMINE  (BENADRYL ) injection 50 mg   And   LORazepam  (ATIVAN ) injection 2 mg   hydrOXYzine  (ATARAX ) tablet 25 mg   QUEtiapine  (SEROQUEL ) tablet 150 mg   insulin  glargine (LANTUS ) injection 15 Units   insulin  aspart (novoLOG ) injection 0-15 Units   buPROPion  (WELLBUTRIN  XL) 24 hr tablet 150 mg   insulin  aspart (novoLOG ) injection 0-5 Units   insulin  aspart (novoLOG ) injection 0-15 Units   insulin  aspart (novoLOG ) injection 4 Units   QUEtiapine  (SEROQUEL ) tablet 150 mg   PTA Medications  Medication Sig   buPROPion  (WELLBUTRIN  XL) 150 MG 24 hr tablet Take 1 tablet (150 mg total) by mouth daily.   insulin  glargine (LANTUS ) 100 UNIT/ML injection Inject 0.15 mLs (15 Units total) into the skin daily.   insulin  aspart (NOVOLOG ) 100 UNIT/ML injection Inject  4 Units into the skin 3 (three) times daily with meals.   QUEtiapine  Fumarate 150 MG TABS Take 150 mg by mouth at bedtime.   albuterol  (VENTOLIN  HFA) 108 (90 Base) MCG/ACT inhaler Inhale 2 puffs into the lungs every 6 (six) hours as needed for wheezing or shortness of breath.       04/30/2024    9:40 AM 04/28/2024    9:27 AM 04/27/2024    7:02 AM  Depression screen PHQ 2/9  Decreased Interest 1 1 1   Down, Depressed, Hopeless 3 2 2   PHQ - 2 Score 4 3 3   Altered sleeping 3 3 2   Tired, decreased energy 3 2 2   Change in appetite 1 2 0  Feeling bad or failure about yourself  3 2 2   Trouble concentrating 3 2 0  Moving slowly or fidgety/restless 3 2 0  Suicidal thoughts 3 2 2   PHQ-9 Score 23 18 11   Difficult doing work/chores   Very difficult    Flowsheet Row ED from 04/27/2024 in [facility] Most recent reading at 04/29/2024  8:42 AM ED from 04/27/2024 in [facility] Emergency Department at [facility] Most recent reading at 04/27/2024  5:26 AM UC from 03/22/2024 in [facility] [place] Most recent reading at 03/22/2024 11:58 AM  C-SSRS RISK CATEGORY High Risk No Risk No Risk    Musculoskeletal  Strength & Muscle Tone: within normal limits Gait & Station: normal Patient leans: N/A  Psychiatric Specialty Exam  Presentation  General Appearance:  Appropriate for Environment; Casual  Eye Contact: Fair  Speech: Clear and Coherent  Speech Volume: Normal  Handedness: Right   Mood and Affect  Mood: Euthymic  Affect: Congruent   Thought Process  Thought Processes: Coherent  Descriptions of Associations:Intact  Orientation:Full (Time, Place and Person)  Thought Content:Logical  Diagnosis of Schizophrenia or Schizoaffective disorder in past: No    Hallucinations:Hallucinations: None  Ideas of Reference:None  Suicidal Thoughts:Suicidal Thoughts: No SI Passive Intent and/or Plan: Without Intent; Without  Plan  Homicidal Thoughts:Homicidal Thoughts: No   Sensorium  Memory: Immediate Good  Judgment: Good  Insight: Good   Executive Functions  Concentration: Good  Attention Span: Good  Recall: Good  Fund of Knowledge: Good  Language: Good   Psychomotor Activity  Psychomotor Activity: Psychomotor Activity: Normal   Assets  Assets: Social Support   Sleep  Sleep: Sleep: Good  Estimated Sleeping Duration (Last 24 Hours): 9.25-11.75 hours  Nutritional Assessment (For OBS and FBC admissions only) Has the patient had a weight loss or gain of  10 pounds or more in the last 3 months?: No Has the patient had a decrease in food intake/or appetite?: No Does the patient have dental problems?: No Does the patient have eating habits or behaviors that may be indicators of an eating disorder including binging or inducing vomiting?: No Has the patient recently lost weight without trying?: 0 Has the patient been eating poorly because of a decreased appetite?: 0 Malnutrition Screening Tool Score: 0    Physical Exam  Physical Exam Review of Systems  Psychiatric/Behavioral:  Positive for depression (stable for outpatient management) and substance abuse. Negative for hallucinations, memory loss and suicidal ideas. The patient is nervous/anxious (stable for outpatient management) and has insomnia (sleeping on current medications).   All other systems reviewed and are negative.  Blood pressure 93/63, pulse 79, temperature 98.1 F (36.7 C), resp. rate 16, SpO2 100%. There is no height or weight on file to calculate BMI.  Demographic Factors:  Male and Low socioeconomic status  Loss Factors: Financial problems/change in socioeconomic status  Historical Factors: Family history of mental illness or substance abuse  Risk Reduction Factors:   Sense of responsibility to family  Continued Clinical Symptoms:  Alcohol/Substance Abuse/Dependencies  Cognitive Features That  Contribute To Risk:  None    Suicide Risk:  Mild: Denies Suicidal ideation. There are no identifiable plans, no associated intent, mild dysphoria and related symptoms, good self-control (both objective and subjective assessment), few other risk factors, and identifiable protective factors, including available and accessible social support.  Disposition: [facility], [place], [place].  Donia Snell, NP 05/02/2024, 11:00 AM

## 2024-05-02 NOTE — Group Note (Signed)
 Group Topic: Communication  Group Date: 05/02/2024 Start Time: 1930 End Time: 2000 Facilitators: Verdon Jacqualyn BRAVO, NT  Department: [facility]  Number of Participants: 4 Group Focus: anger management Treatment Modality:  Individual Therapy Interventions utilized were confrontation Purpose: increase insight  Name: Nathan Koch Date of Birth: [birth year]  MR: [id number]    Level of Participation: active Quality of Participation: cooperative Interactions with others: gave feedback Mood/Affect: appropriate Triggers (if applicable): N/A Cognition: coherent/clear Progress: Moderate Response: N/A 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   Self-inflicted laceration of left wrist ([name]) 01/17/2021   Severe recurrent major depression without psychotic features ([name]) 01/16/2021   Bipolar 1 disorder, depressed ([name])    MDD (major depressive disorder), recurrent episode, severe ([name]) 09/11/2020   MDD (major depressive disorder), recurrent severe, without psychosis ([name]) 09/10/2020   Suicidal ideation    Depression, major, recurrent, 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
